# Patient Record
Sex: Male | Born: 1975 | Race: Black or African American | Hispanic: No | Marital: Single | Smoking: Current every day smoker
Health system: Southern US, Community
[De-identification: ages and names within clinical notes are randomized; demographics above are authoritative.]

## PROBLEM LIST (undated history)

## (undated) ENCOUNTER — Emergency Department (HOSPITAL_COMMUNITY): Discharge status: Absent without leave | Payer: Self-pay

## (undated) DIAGNOSIS — I10 Essential (primary) hypertension: Secondary | ICD-10-CM

## (undated) DIAGNOSIS — C801 Malignant (primary) neoplasm, unspecified: Secondary | ICD-10-CM

## (undated) DIAGNOSIS — Z21 Asymptomatic human immunodeficiency virus [HIV] infection status: Secondary | ICD-10-CM

## (undated) DIAGNOSIS — E119 Type 2 diabetes mellitus without complications: Secondary | ICD-10-CM

## (undated) DIAGNOSIS — S8263XA Displaced fracture of lateral malleolus of unspecified fibula, initial encounter for closed fracture: Secondary | ICD-10-CM

## (undated) DIAGNOSIS — B20 Human immunodeficiency virus [HIV] disease: Secondary | ICD-10-CM

## (undated) DIAGNOSIS — Z8674 Personal history of sudden cardiac arrest: Secondary | ICD-10-CM

## (undated) DIAGNOSIS — I5189 Other ill-defined heart diseases: Secondary | ICD-10-CM

## (undated) DIAGNOSIS — F1911 Other psychoactive substance abuse, in remission: Secondary | ICD-10-CM

## (undated) DIAGNOSIS — Z8701 Personal history of pneumonia (recurrent): Secondary | ICD-10-CM

## (undated) DIAGNOSIS — E785 Hyperlipidemia, unspecified: Secondary | ICD-10-CM

## (undated) DIAGNOSIS — R12 Heartburn: Secondary | ICD-10-CM

## (undated) DIAGNOSIS — S93422A Sprain of deltoid ligament of left ankle, initial encounter: Secondary | ICD-10-CM

## (undated) DIAGNOSIS — Z87898 Personal history of other specified conditions: Secondary | ICD-10-CM

## (undated) DIAGNOSIS — Z91199 Patient's noncompliance with other medical treatment and regimen due to unspecified reason: Secondary | ICD-10-CM

## (undated) DIAGNOSIS — Z87891 Personal history of nicotine dependence: Secondary | ICD-10-CM

## (undated) DIAGNOSIS — G43909 Migraine, unspecified, not intractable, without status migrainosus: Secondary | ICD-10-CM

## (undated) DIAGNOSIS — A6002 Herpesviral infection of other male genital organs: Secondary | ICD-10-CM

## (undated) HISTORY — DX: Personal history of other specified conditions: Z87.898

## (undated) HISTORY — PX: NO PAST SURGERIES: SHX2092

## (undated) HISTORY — DX: Personal history of sudden cardiac arrest: Z86.74

## (undated) HISTORY — DX: Patient's noncompliance with other medical treatment and regimen due to unspecified reason: Z91.199

## (undated) HISTORY — DX: Other psychoactive substance abuse, in remission: F19.11

## (undated) HISTORY — DX: Personal history of pneumonia (recurrent): Z87.01

## (undated) HISTORY — DX: Personal history of nicotine dependence: Z87.891

## (undated) HISTORY — DX: Other ill-defined heart diseases: I51.89

---

## 1999-08-25 ENCOUNTER — Emergency Department (HOSPITAL_COMMUNITY): Admission: EM | Admit: 1999-08-25 | Discharge: 1999-08-25 | Payer: Self-pay | Admitting: Emergency Medicine

## 2000-03-16 ENCOUNTER — Emergency Department (HOSPITAL_COMMUNITY): Admission: EM | Admit: 2000-03-16 | Discharge: 2000-03-16 | Payer: Self-pay | Admitting: Emergency Medicine

## 2005-02-22 ENCOUNTER — Emergency Department (HOSPITAL_COMMUNITY): Admission: EM | Admit: 2005-02-22 | Discharge: 2005-02-22 | Payer: Self-pay | Admitting: Family Medicine

## 2005-06-08 ENCOUNTER — Emergency Department (HOSPITAL_COMMUNITY): Admission: EM | Admit: 2005-06-08 | Discharge: 2005-06-08 | Payer: Self-pay | Admitting: Family Medicine

## 2005-11-05 ENCOUNTER — Emergency Department (HOSPITAL_COMMUNITY): Admission: EM | Admit: 2005-11-05 | Discharge: 2005-11-05 | Payer: Self-pay | Admitting: Family Medicine

## 2011-10-02 ENCOUNTER — Encounter (HOSPITAL_COMMUNITY): Payer: Self-pay

## 2011-10-02 ENCOUNTER — Emergency Department (HOSPITAL_COMMUNITY)
Admission: EM | Admit: 2011-10-02 | Discharge: 2011-10-02 | Disposition: A | Discharge status: Home / Self Care | Payer: Self-pay | Attending: Emergency Medicine | Admitting: Emergency Medicine

## 2011-10-02 DIAGNOSIS — L02215 Cutaneous abscess of perineum: Secondary | ICD-10-CM

## 2011-10-02 DIAGNOSIS — E785 Hyperlipidemia, unspecified: Secondary | ICD-10-CM | POA: Insufficient documentation

## 2011-10-02 DIAGNOSIS — F17200 Nicotine dependence, unspecified, uncomplicated: Secondary | ICD-10-CM | POA: Insufficient documentation

## 2011-10-02 DIAGNOSIS — I10 Essential (primary) hypertension: Secondary | ICD-10-CM | POA: Insufficient documentation

## 2011-10-02 DIAGNOSIS — K612 Anorectal abscess: Secondary | ICD-10-CM | POA: Insufficient documentation

## 2011-10-02 DIAGNOSIS — L02219 Cutaneous abscess of trunk, unspecified: Secondary | ICD-10-CM | POA: Insufficient documentation

## 2011-10-02 DIAGNOSIS — Z21 Asymptomatic human immunodeficiency virus [HIV] infection status: Secondary | ICD-10-CM | POA: Insufficient documentation

## 2011-10-02 DIAGNOSIS — B20 Human immunodeficiency virus [HIV] disease: Secondary | ICD-10-CM

## 2011-10-02 HISTORY — DX: Herpesviral infection of other male genital organs: A60.02

## 2011-10-02 HISTORY — DX: Human immunodeficiency virus (HIV) disease: B20

## 2011-10-02 HISTORY — DX: Essential (primary) hypertension: I10

## 2011-10-02 HISTORY — DX: Hyperlipidemia, unspecified: E78.5

## 2011-10-02 MED ORDER — OXYCODONE-ACETAMINOPHEN 5-325 MG PO TABS
1.0000 | ORAL_TABLET | Freq: Once | ORAL | Status: AC
  Administered 2011-10-02: 1 via ORAL
  Filled 2011-10-02: qty 1

## 2011-10-02 MED ORDER — OXYCODONE-ACETAMINOPHEN 5-325 MG PO TABS: 1.0000 | ORAL_TABLET | Freq: Three times a day (TID) | ORAL | Status: AC | PRN

## 2011-10-02 MED ORDER — LIDOCAINE-HYDROCORTISONE ACE 3-0.5 % RE CREA: 1.0000 | TOPICAL_CREAM | Freq: Two times a day (BID) | RECTAL | Status: DC

## 2011-10-02 MED ORDER — SULFAMETHOXAZOLE-TMP DS 800-160 MG PO TABS: 1.0000 | ORAL_TABLET | Freq: Two times a day (BID) | ORAL | Status: DC

## 2011-10-02 MED ORDER — SULFAMETHOXAZOLE-TMP DS 800-160 MG PO TABS
1.0000 | ORAL_TABLET | Freq: Once | ORAL | Status: AC
  Administered 2011-10-02: 1 via ORAL
  Filled 2011-10-02: qty 1

## 2011-10-02 NOTE — Discharge Instructions (Signed)
Abscess An abscess (boil or furuncle) is an infected area under your skin. This area is filled with yellowish white fluid (pus). HOME CARE   Only take medicine as told by your doctor.   Keep the skin clean around your abscess. Keep clothes that may touch the abscess clean.   Change any bandages (dressings) as told by your doctor.   Avoid direct skin contact with other people. The infection can spread by skin contact with others.   Practice good hygiene and do not share personal care items.   Do not share athletic equipment, towels, or whirlpools. Shower after every practice or work out session.   If a draining area cannot be covered:   Do not play sports.   Children should not go to daycare until the wound has healed or until fluid (drainage) stops coming out of the wound.   See your doctor for a follow-up visit as told.  GET HELP RIGHT AWAY IF:   There is more pain, puffiness (swelling), and redness in the wound site.   There is fluid or bleeding from the wound site.   You have muscle aches, chills, fever, or feel sick.   You or your child has a temperature by mouth above 102 F (38.9 C), not controlled by medicine.   Your baby is older than 3 months with a rectal temperature of 102 F (38.9 C) or higher.  MAKE SURE YOU:   Understand these instructions.   Will watch your condition.   Will get help right away if you are not doing well or get worse.  Document Released: 12/16/2007 Document Revised: 06/18/2011 Document Reviewed: 12/16/2007 [facility] Patient Information 2012 [place], [place].CD4 Count This is a test used to measure the strength of your immune system if you've been diagnosed with HIV infection. This test measures the number of CD4 cells (also known as T-helper cells) in your blood and assesses the status of your immune system. CD4 cells are a type of white blood cell that fights infection, and they play an important role in your immune system. They help to  identify, attack, and destroy specific bacteria, fungi, and other germs that affect the body. CD4 cells are made in the spleen, lymph nodes, and thymus gland, and they circulate throughout the body in the bloodstream. CD4 cells are a major target for HIV, which binds to the surface of CD4 cells, enters them, and either reproduces immediately, killing them in the process, or remains in a resting state, reproducing later. As the HIV virus gets into the cell and replicates, the number of CD4 cells in the blood gradually declines. As HIV disease progresses, the CD4 count will go down and as treatment reduces the progression, the CD4 count will go back up.  The CD4 count tells your caregivers how strong your immune system is, how far HIV disease has advanced (the stage of the disease), and helps predict the risk of complications and debilitating infections. The CD4 count is most useful when it is compared with the count obtained from an earlier test.  The CD4 count is used in combination with the viral load test, which measures the level of HIV in the blood, to determine the staging and outlook of the disease.  PREPARATION FOR TEST No preparation is required.  NORMAL FINDINGS  T cells  Percent: 60-95   Number of Cells/microL: [phone]  T-helper (CD4) cells  Percent: 60-75   Number of Cells/microL: [phone]  T-suppressor (CD8) cells  Percent: 25-30  Number of Cells/microL: [phone]  B cells  Percent: 4-25   Number of Cells/microL: 100-450  Natural killer cells  Percent: 4-30   Number of Cells/microL:75-500  CD4/CD8 ratio: Greater than 1 Ranges for normal findings may vary among different laboratories and hospitals. You should always check with your doctor after having lab work or other tests done to discuss the meaning of your test results and whether your values are considered within normal limits. MEANING OF TEST  Your caregiver will go over the test results with you and discuss the  importance and meaning of your results, as well as treatment options and the need for additional tests if necessary. OBTAINING THE TEST RESULTS It is your responsibility to obtain your test results. Ask the lab or department performing the test when and how you will get your results. Document Released: 07/21/2004 Document Revised: 06/18/2011 Document Reviewed: 06/06/2008 [facility] Patient Information 2012 [place], [place].  HIV Infection and AIDS HIV stands for human immunodeficiency virus. HIV is the virus that causes the disease known as AIDS (acquired immunodeficiency syndrome). HIV is a viral infection that attacks the T-cell lymphocytes of the human immune system. If left untreated, HIV will kill enough T-cells so that the body cannot fight off infection. Patients who have AIDS, suffer from "opportunistic infections." Opportunistic infections take advantage of the patient's weak immune system, and cause illness. RISK FACTORS   Direct contact with blood or other body fluids.   Unprotected sexual intercourse.   Sharing of contaminated needles.   Blood transfusions.   Infants whose mothers were infected, during pregnancy or through breast milk.  SYMPTOMS   Sometimes, no symptoms.   Flu-like symptoms.   Repeated severe yeast infections in mouth or vagina, despite treatment.   Swollen lymph nodes.   Muscle pain.   Joint pain.   Persistent diarrhea.   Loss of appetite.   Weight loss.   Frequent opportunistic diseases:   Kaposi's sarcoma.   Pneumocystis carinii pneumonia (PCP)   Tuberculosis.   Meningitis.   Herpes simplex infections.   Blurry vision.   Loss of vision.  PREVENTION   Know the sexual history of any new sexual partner.   Use safe sex practices, with barrier protection.   Avoid having multiple sexual partners.   Avoid direct contact with blood or other body fluids, by using gloves, goggles, and masks when you might encounter them.   Do not  share needles.  TREATMENT  HIV and AIDS have no known cure. However, with early diagnosis and proper treatment, one can live a relatively healthy and long life. Treatment is directed at decreasing the level of virus in the body (viral load). To decrease the viral load, patients are given antiviral medicines. Patients are also given preventive care for many opportunistic diseases, such as pneumonia, tuberculosis, toxoplasmosis, tetanus, hepatitis B, pneumococcal infections, and influenza. Opportunistic infections are also treated as they develop.  Document Released: 06/29/2005 Document Revised: 06/18/2011 Document Reviewed: 10/11/2008 [facility] Patient Information 2012 [place], [place].

## 2011-10-02 NOTE — ED Notes (Signed)
Pt states that for the past week he has been having an abscess or sore area that is inbetween his rectum and scrotum. He states that he also has sores on both side of his genitalia on both sides. Pt has been trying to use cream with no relief.

## 2011-10-02 NOTE — ED Provider Notes (Signed)
History     CSN: [id number]  Arrival date & time 10/02/11  1308   First MD Initiated Contact with Patient 10/02/11 1100      Chief Complaint  Patient presents with  . Abscess   Patient with a known history of HIV disease, herpes, hypertension. States is scheduled to see the infectious disease clinic. Later this month. He is currently not on his HIV medications. Patient reports draining area, "abscess" around the perineum, and right perirectal area. He's had no fevers, no vomiting, no abdominal pain, no dysuria, no testicular pain. No back pain, chest pain, shortness of breath. No dizziness or syncope. (Consider location/radiation/quality/duration/timing/severity/associated sxs/prior treatment) HPI  Past Medical History  Diagnosis Date  . Herpes genitalis in men   . HIV disease   . Hyperlipidemia   . Hypertension     History reviewed. No pertinent past surgical history.  History reviewed. No pertinent family history.  History  Substance Use Topics  . Smoking status: Current Everyday Smoker -- 0.5 packs/day  . Smokeless tobacco: Not on file  . Alcohol Use: No      Review of Systems  All other systems reviewed and are negative.    Allergies  Review of patient's allergies indicates no known allergies.  Home Medications  No current outpatient prescriptions on file.  BP 152/107  Pulse 107  Temp(Src) 98.5 F (36.9 C) (Oral)  Resp 20  SpO2 99%  Physical Exam  Nursing note and vitals reviewed. Constitutional: He is oriented to person, place, and time. He appears well-developed and well-nourished.  HENT:  Head: Normocephalic and atraumatic.  Eyes: Conjunctivae and EOM are normal. Pupils are equal, round, and reactive to light.  Neck: Neck supple.  Cardiovascular: Normal rate and regular rhythm.  Exam reveals no gallop and no friction rub.   No murmur heard. Pulmonary/Chest: Breath sounds normal. He has no wheezes. He has no rales. He exhibits no tenderness.    Abdominal: Soft. Bowel sounds are normal. He exhibits no distension. There is no tenderness. There is no rebound and no guarding.  Genitourinary: Penis normal.       No testicular tenderness or swelling. Open area draining some yellow exudative fluid in the left perineum. However, there is no redness, no induration, no swelling, no appreciable drainable fluid collection. Similar area in the right perirectal area, which is draining a small amount of yellow fluid. Again, no induration, no redness, no obvious drainable fluid collection.  Musculoskeletal: Normal range of motion.  Neurological: He is alert and oriented to person, place, and time. No cranial nerve deficit. Coordination normal.  Skin: Skin is warm and dry. No rash noted.  Psychiatric: He has a normal mood and affect.    ED Course  Procedures (including critical care time)  Labs Reviewed - No data to display No results found.   No diagnosis found.  Patient is seen and examined, initial history and physical is completed. Evaluation initiated  MDM  Patient is seen and examined, initial history and physical is completed. Evaluation initiated  Mild hypertension noted, other vital signs normal. Patient is completely examined. Will put on a course of Bactrim and Percocet. There is no obvious drainable fluid collection that would require an incision and drainage. At this time. Recommend followup in the ED in 24-48 hours.      Alilah Mcmeans A. Patrica Duel, MD 10/02/11 1114

## 2011-10-08 ENCOUNTER — Ambulatory Visit (INDEPENDENT_AMBULATORY_CARE_PROVIDER_SITE_OTHER): Payer: Self-pay

## 2011-10-08 ENCOUNTER — Other Ambulatory Visit: Payer: Self-pay | Admitting: Internal Medicine

## 2011-10-08 DIAGNOSIS — B20 Human immunodeficiency virus [HIV] disease: Secondary | ICD-10-CM

## 2011-10-08 DIAGNOSIS — Z79899 Other long term (current) drug therapy: Secondary | ICD-10-CM

## 2011-10-08 DIAGNOSIS — Z113 Encounter for screening for infections with a predominantly sexual mode of transmission: Secondary | ICD-10-CM

## 2011-10-08 LAB — COMPLETE METABOLIC PANEL WITH GFR
ALT: 38 U/L (ref 0–53)
AST: 36 U/L (ref 0–37)
Alkaline Phosphatase: 93 U/L (ref 39–117)
CO2: 26 mEq/L (ref 19–32)
Creat: 0.91 mg/dL (ref 0.50–1.35)
GFR, Est African American: >89 mL/min
Sodium: 138 mEq/L (ref 135–145)
Total Bilirubin: 0.4 mg/dL (ref 0.3–1.2)
Total Protein: 8.1 g/dL (ref 6.0–8.3)

## 2011-10-08 LAB — CBC WITH DIFFERENTIAL/PLATELET
Basophils Absolute: 0 10*3/uL (ref 0.0–0.1)
Eosinophils Absolute: 0.1 10*3/uL (ref 0.0–0.7)
Eosinophils Relative: 3 % (ref 0–5)
Lymphs Abs: 0.3 10*3/uL — ABNORMAL LOW (ref 0.7–4.0)
MCH: 28.8 pg (ref 26.0–34.0)
MCV: 89.3 fL (ref 78.0–100.0)
Neutrophils Relative %: 58 % (ref 43–77)
Platelets: 183 10*3/uL (ref 150–400)
RBC: 3.93 MIL/uL — ABNORMAL LOW (ref 4.22–5.81)
RDW: 13.8 % (ref 11.5–15.5)
WBC: 1.6 10*3/uL — ABNORMAL LOW (ref 4.0–10.5)

## 2011-10-08 LAB — LIPID PANEL
HDL: 20 mg/dL — ABNORMAL LOW (ref >39)
LDL Cholesterol: 18 mg/dL (ref 0–99)

## 2011-10-08 LAB — HEPATITIS B SURFACE ANTIGEN: Hepatitis B Surface Ag: NEGATIVE

## 2011-10-08 LAB — URINALYSIS
Hgb urine dipstick: NEGATIVE
Ketones, ur: NEGATIVE mg/dL
Leukocytes, UA: NEGATIVE
Nitrite: NEGATIVE
Protein, ur: 30 mg/dL — AB
pH: 6 (ref 5.0–8.0)

## 2011-10-08 LAB — HEPATITIS C ANTIBODY: HCV Ab: REACTIVE — AB

## 2011-10-08 LAB — HEPATITIS B SURFACE ANTIBODY,QUALITATIVE: Hep B S Ab: NEGATIVE

## 2011-10-08 MED ORDER — DARUNAVIR ETHANOLATE 600 MG PO TABS: 600.0000 mg | ORAL_TABLET | Freq: Two times a day (BID) | ORAL | Status: DC

## 2011-10-08 MED ORDER — RITONAVIR 100 MG PO TABS: 100.0000 mg | ORAL_TABLET | Freq: Two times a day (BID) | ORAL | Status: DC

## 2011-10-08 MED ORDER — SULFAMETHOXAZOLE-TMP DS 800-160 MG PO TABS: 1.0000 | ORAL_TABLET | Freq: Once | ORAL | Status: DC

## 2011-10-08 MED ORDER — RALTEGRAVIR POTASSIUM 400 MG PO TABS: 400.0000 mg | ORAL_TABLET | Freq: Two times a day (BID) | ORAL | Status: DC

## 2011-10-08 MED ORDER — AZITHROMYCIN 600 MG PO TABS: 1200.0000 mg | ORAL_TABLET | ORAL | Status: DC

## 2011-10-08 MED ORDER — EMTRICITABINE-TENOFOVIR DF 200-300 MG PO TABS: 1.0000 | ORAL_TABLET | Freq: Every day | ORAL | Status: DC

## 2011-10-08 MED ORDER — VALACYCLOVIR HCL 500 MG PO TABS: 500.0000 mg | ORAL_TABLET | Freq: Two times a day (BID) | ORAL | Status: DC

## 2011-10-08 NOTE — Progress Notes (Signed)
Patient will be automatically restarted on ART regimen of RAL/truvada/DARr/etravirine since patient previously on RAL/truvada, but potentially had spotty compliance. I have added boosted PI, concern for resistance and loss of II.  Patient will be able to get 1 month of supplies, and in meantime, reapply for adap so that he doesn't have gap in coverage. Patient was last on ART in Aug 2012, CD 4 count reportedly 20-30s. We will also have pt start on OI with bactrim DS daily, azithromycin 1200mg  Qwk, and hsv proph with valtrex. Await lab results and have patient come into clinic for intake and initial visit early next week.

## 2011-10-09 LAB — T-HELPER CELL (CD4) - (RCID CLINIC ONLY): CD4 % Helper T Cell: 7 % — ABNORMAL LOW (ref 33–55)

## 2011-10-09 LAB — HEPATITIS A ANTIBODY, TOTAL: Hep A Total Ab: NEGATIVE

## 2011-10-09 LAB — HEPATITIS B CORE ANTIBODY, TOTAL: Hep B Core Total Ab: NEGATIVE

## 2011-10-12 LAB — HIV-1 RNA ULTRAQUANT REFLEX TO GENTYP+
HIV 1 RNA Quant: 2093782 copies/mL — ABNORMAL HIGH (ref <20)
HIV-1 RNA Quant, Log: 6.32 {Log} — ABNORMAL HIGH (ref <1.30)

## 2011-10-12 NOTE — Patient Instructions (Signed)
Pt is to return for work in t visit on 10-13-14 to see Dr Drue Second .  Please pick up medications prescribed today at the pharmacy and start today.  See financial counselor to renew ADAP and Juanell Fairly .

## 2011-10-12 NOTE — Progress Notes (Signed)
Pt is a transfer from [facility] that has presented today without proper medical records. Only one office note dated 08-14-10.  Patient states he has a history of non compliance to to transportation  problems. He has not been on his HIV medications for 5 months. He is not sure of the name of the medications but knows at some point he was taking Atripla and told to stop by his ID provider. Pt is very anxious and concerned about the progression of his HIV since he has been with extreme fatigue for months now.  He has a rash on his face and states he has an abscess in his rectum that is currently being treated with Bactrim given at the Urgent Care. He states his entire groin area is inflamed and irritated .  Last regimen obtain from pharmacy. His ADAP is due to end 10-10-10.  I spoke with Dr Osvaldo Human who has agreed to restart his medications and see patient as a work in visit on 10-13-11. Pt states as his last office visit his CD4 was very low.  Office note dated 08-14-10 stared CD4 as33.  Labs included with this note dated 03-05-11 CD4= 72 with a viral load of [id number]. Several attempts made to contact [facility] office for more labs and medical records .  Release signed and faxed but I never received a response. Release sent to medical records to obtain entire charting.   Pt states he was given medication at Urgent Care  for rectal abcess but he was not able to pay for script due to price. He did get the antibiotic.   [name], RN

## 2011-10-13 ENCOUNTER — Ambulatory Visit: Payer: Self-pay

## 2011-10-13 ENCOUNTER — Ambulatory Visit: Payer: Self-pay | Admitting: Internal Medicine

## 2011-10-15 LAB — HIV-1 GENOTYPR PLUS

## 2011-10-27 ENCOUNTER — Encounter: Payer: Self-pay | Admitting: Internal Medicine

## 2011-10-27 ENCOUNTER — Ambulatory Visit: Payer: Self-pay

## 2011-10-27 ENCOUNTER — Ambulatory Visit (INDEPENDENT_AMBULATORY_CARE_PROVIDER_SITE_OTHER): Payer: Self-pay | Admitting: Internal Medicine

## 2011-10-27 ENCOUNTER — Telehealth: Payer: Self-pay | Admitting: Licensed Clinical Social Worker

## 2011-10-27 VITALS — BP 157/103 | HR 93 | Temp 98.7°F | Ht 72.0 in | Wt 240.0 lb

## 2011-10-27 DIAGNOSIS — I10 Essential (primary) hypertension: Secondary | ICD-10-CM | POA: Insufficient documentation

## 2011-10-27 DIAGNOSIS — E785 Hyperlipidemia, unspecified: Secondary | ICD-10-CM

## 2011-10-27 DIAGNOSIS — E781 Pure hyperglyceridemia: Secondary | ICD-10-CM

## 2011-10-27 DIAGNOSIS — B009 Herpesviral infection, unspecified: Secondary | ICD-10-CM | POA: Insufficient documentation

## 2011-10-27 DIAGNOSIS — B20 Human immunodeficiency virus [HIV] disease: Secondary | ICD-10-CM

## 2011-10-27 DIAGNOSIS — Z21 Asymptomatic human immunodeficiency virus [HIV] infection status: Secondary | ICD-10-CM

## 2011-10-27 MED ORDER — DARUNAVIR ETHANOLATE 600 MG PO TABS: 600.0000 mg | ORAL_TABLET | Freq: Two times a day (BID) | ORAL | Status: DC

## 2011-10-27 MED ORDER — RITONAVIR 100 MG PO TABS: 100.0000 mg | ORAL_TABLET | Freq: Two times a day (BID) | ORAL | Status: DC

## 2011-10-27 MED ORDER — SULFAMETHOXAZOLE-TMP DS 800-160 MG PO TABS: 1.0000 | ORAL_TABLET | Freq: Once | ORAL | Status: DC

## 2011-10-27 MED ORDER — ROSUVASTATIN CALCIUM 20 MG PO TABS: 20.0000 mg | ORAL_TABLET | Freq: Every day | ORAL | Status: DC

## 2011-10-27 MED ORDER — AZITHROMYCIN 600 MG PO TABS: 1200.0000 mg | ORAL_TABLET | ORAL | Status: DC

## 2011-10-27 MED ORDER — VALACYCLOVIR HCL 500 MG PO TABS: 500.0000 mg | ORAL_TABLET | Freq: Two times a day (BID) | ORAL | Status: DC

## 2011-10-27 MED ORDER — RALTEGRAVIR POTASSIUM 400 MG PO TABS: 400.0000 mg | ORAL_TABLET | Freq: Two times a day (BID) | ORAL | Status: DC

## 2011-10-27 MED ORDER — EMTRICITABINE-TENOFOVIR DF 200-300 MG PO TABS: 1.0000 | ORAL_TABLET | Freq: Every day | ORAL | Status: DC

## 2011-10-27 NOTE — Telephone Encounter (Signed)
Patient called stating that he is out of bactrim and Isentress. He only has Truvada, and wants to know what we could do to help him with medications while he is waiting for ADAP to be approved. I called the patient back to let him know currently we don't have samples of those medications and not to take the Truvada without the Isentress.

## 2011-11-03 ENCOUNTER — Telehealth: Payer: Self-pay

## 2011-11-03 NOTE — Telephone Encounter (Signed)
Jeremiah Stevens,  Will you please make a referral for Jeremiah Stevens for Dental Clinic services.  He is aware there is a waiting list.  He called today and said he forgot to ask during his office visit.   [name], RN, BSN

## 2011-11-06 NOTE — Telephone Encounter (Signed)
Called patient and informed him that the dental form has to be signed by him before they will make him an appt. He advised he lives in [place] and it would be better if I mailed it to him.

## 2011-11-09 ENCOUNTER — Other Ambulatory Visit: Payer: Self-pay | Admitting: Licensed Clinical Social Worker

## 2011-11-09 ENCOUNTER — Telehealth: Payer: Self-pay

## 2011-11-09 DIAGNOSIS — B20 Human immunodeficiency virus [HIV] disease: Secondary | ICD-10-CM

## 2011-11-09 DIAGNOSIS — E785 Hyperlipidemia, unspecified: Secondary | ICD-10-CM

## 2011-11-09 DIAGNOSIS — B009 Herpesviral infection, unspecified: Secondary | ICD-10-CM

## 2011-11-09 MED ORDER — EMTRICITABINE-TENOFOVIR DF 200-300 MG PO TABS: 1.0000 | ORAL_TABLET | Freq: Every day | ORAL | Status: DC

## 2011-11-09 MED ORDER — ROSUVASTATIN CALCIUM 20 MG PO TABS: 20.0000 mg | ORAL_TABLET | Freq: Every day | ORAL | Status: DC

## 2011-11-09 MED ORDER — SULFAMETHOXAZOLE-TMP DS 800-160 MG PO TABS: 1.0000 | ORAL_TABLET | Freq: Every day | ORAL | Status: DC

## 2011-11-09 MED ORDER — RITONAVIR 100 MG PO TABS: 100.0000 mg | ORAL_TABLET | Freq: Two times a day (BID) | ORAL | Status: DC

## 2011-11-09 MED ORDER — AZITHROMYCIN 600 MG PO TABS: 1200.0000 mg | ORAL_TABLET | ORAL | Status: DC

## 2011-11-09 MED ORDER — RALTEGRAVIR POTASSIUM 400 MG PO TABS: 400.0000 mg | ORAL_TABLET | Freq: Two times a day (BID) | ORAL | Status: DC

## 2011-11-09 MED ORDER — VALACYCLOVIR HCL 500 MG PO TABS: 500.0000 mg | ORAL_TABLET | Freq: Two times a day (BID) | ORAL | Status: DC

## 2011-11-09 MED ORDER — LIDOCAINE-HYDROCORTISONE ACE 3-0.5 % RE CREA: 1.0000 | TOPICAL_CREAM | Freq: Two times a day (BID) | RECTAL | Status: DC

## 2011-11-09 MED ORDER — DARUNAVIR ETHANOLATE 600 MG PO TABS: 600.0000 mg | ORAL_TABLET | Freq: Two times a day (BID) | ORAL | Status: DC

## 2011-11-09 NOTE — Telephone Encounter (Signed)
Patient was approved for ADAP - called to let him know and phone vm box has not been set-uo yet - let Amy Pulliam know he was approved.

## 2011-11-25 NOTE — Progress Notes (Signed)
HIV CLINIC VISIT  RFV: follow up Subjective:    [name], male    DOB: 1975-08-12, 36 y.o.   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 Diagnosed in 2006. Currently on ral/truvada/boosted darunavir. Previously getting hiv care at [facility]. Ran out of medications 3 months ago. Denies any acute illness. No f/chills/ns/cough/n/v/diarrhea/headache or rash.  Prior to Admission medications   Medication Sig Start Date End Date Taking? Authorizing Provider  azithromycin (ZITHROMAX) 600 MG tablet Take 2 tablets (1,200 mg total) by mouth once a week. 11/09/11   Judyann Munson, MD  darunavir (PREZISTA) 600 MG tablet Take 1 tablet (600 mg total) by mouth 2 (two) times daily with a meal. 11/09/11 11/08/12  Judyann Munson, MD  emtricitabine-tenofovir (TRUVADA) 200-300 MG per tablet Take 1 tablet by mouth daily. 11/09/11 11/08/12  Judyann Munson, MD  lidocaine-hydrocortisone (ANAMANTLE HC) 3-0.5 % CREA Place 1 Applicatorful rectally 2 (two) times daily. 11/09/11   Judyann Munson, MD  raltegravir (ISENTRESS) 400 MG tablet Take 1 tablet (400 mg total) by mouth 2 (two) times daily. 11/09/11 11/08/12  Judyann Munson, MD  ritonavir (NORVIR) 100 MG TABS Take 1 tablet (100 mg total) by mouth 2 (two) times daily with a meal. 11/09/11   Judyann Munson, MD  rosuvastatin (CRESTOR) 20 MG tablet Take 1 tablet (20 mg total) by mouth at bedtime. 11/09/11 11/08/12  Judyann Munson, MD  sulfamethoxazole-trimethoprim (BACTRIM DS) 800-160 MG per tablet Take 1 tablet by mouth daily. 11/09/11   Judyann Munson, MD  valACYclovir (VALTREX) 500 MG tablet Take 1 tablet (500 mg total) by mouth 2 (two) times daily. 11/09/11   Judyann Munson, MD   Active Ambulatory Problems    Diagnosis Date Noted  . Human immunodeficiency virus (HIV) disease 10/27/2011  . HTN (hypertension) 10/27/2011  . Hypertriglyceridemia 10/27/2011  . Herpes 10/27/2011   Resolved Ambulatory  Problems    Diagnosis Date Noted  . No Resolved Ambulatory Problems   Past Medical History  Diagnosis Date  . Herpes genitalis in men   . HIV disease   . Hyperlipidemia   . Hypertension    History  Substance Use Topics  . Smoking status: Current Everyday Smoker -- 0.5 packs/day for 16 years  . Smokeless tobacco: Never Used  . Alcohol Use: No   Family hx of DM and CAD  Review of Systems Review of Systems  Constitutional: Negative for fever, chills, diaphoresis, activity change, appetite change, fatigue and unexpected weight change.  HENT: Negative for congestion, sore throat, rhinorrhea, sneezing, trouble swallowing and sinus pressure.  Eyes: Negative for photophobia and visual disturbance.  Respiratory: Negative for cough, chest tightness, shortness of breath, wheezing and stridor.  Cardiovascular: Negative for chest pain, palpitations and leg swelling.  Gastrointestinal: Negative for nausea, vomiting, abdominal pain, diarrhea, constipation, blood in stool, abdominal distention and anal bleeding.  Genitourinary: Negative for dysuria, hematuria, flank pain and difficulty urinating.  Musculoskeletal: Negative for myalgias, back pain, joint swelling, arthralgias and gait problem.  Skin: Negative for color change, pallor, rash and wound.  Neurological: Negative for dizziness, tremors, weakness and light-headedness.  Hematological: Negative for adenopathy. Does not bruise/bleed easily.  Psychiatric/Behavioral: Negative for behavioral problems, confusion, sleep disturbance, dysphoric mood, decreased concentration and agitation.       Objective:   Physical Exam  BP 157/103  Pulse 93  Temp(Src) 98.7 F (37.1 C) (Oral)  Ht 6' (1.829 m)  Wt 240  lb (108.863 kg)  BMI 32.55 kg/m2 Physical Exam  Constitutional: He is oriented to person, place, and time. He appears well-developed and well-nourished. No distress.  HENT:  Mouth/Throat: Oropharynx is clear and moist. No oropharyngeal  exudate.  Cardiovascular: Normal rate, regular rhythm and normal heart sounds. Exam reveals no gallop and no friction rub.  No murmur heard.  Pulmonary/Chest: Effort normal and breath sounds normal. No respiratory distress. He has no wheezes.  Abdominal: Soft. Bowel sounds are normal. He exhibits no distension. There is no tenderness.  Lymphadenopathy:  He has no cervical adenopathy.  Neurological: He is alert and oriented to person, place, and time.  Skin: Skin is warm and dry. No rash noted. No erythema.  Psychiatric: He has a normal mood and affect. His behavior is normal.         Assessment & Plan:  HIV = will need to re initiate his prior salvage regimen of raltegravir/boosted darunavir/truvada with the plan to recheck labs in 4-6 wks to see that he is being suppressed as expected.  OI proph = continue with bactrim DS daily, and azithro 1200mg  Qwk  Hypertriglyceridemia= will recommend to start rosuvastatin and may need fish oil as well   rtc in 4-6wks.

## 2011-11-26 ENCOUNTER — Ambulatory Visit (INDEPENDENT_AMBULATORY_CARE_PROVIDER_SITE_OTHER): Payer: Self-pay | Admitting: Internal Medicine

## 2011-11-26 ENCOUNTER — Encounter: Payer: Self-pay | Admitting: Internal Medicine

## 2011-11-26 VITALS — BP 166/91 | HR 98 | Temp 98.4°F | Wt 234.0 lb

## 2011-11-26 DIAGNOSIS — Z21 Asymptomatic human immunodeficiency virus [HIV] infection status: Secondary | ICD-10-CM

## 2011-11-26 DIAGNOSIS — B20 Human immunodeficiency virus [HIV] disease: Secondary | ICD-10-CM

## 2011-11-26 LAB — COMPREHENSIVE METABOLIC PANEL
ALT: 18 U/L (ref 0–53)
AST: 19 U/L (ref 0–37)
Calcium: 9.3 mg/dL (ref 8.4–10.5)
Chloride: 105 mEq/L (ref 96–112)
Creat: 1.03 mg/dL (ref 0.50–1.35)

## 2011-11-27 LAB — CBC WITH DIFFERENTIAL/PLATELET
Basophils Absolute: 0 10*3/uL (ref 0.0–0.1)
Basophils Relative: 0 % (ref 0–1)
MCHC: 34.4 g/dL (ref 30.0–36.0)
Monocytes Relative: 20 % — ABNORMAL HIGH (ref 3–12)
Neutro Abs: 1 10*3/uL — ABNORMAL LOW (ref 1.7–7.7)
Neutrophils Relative %: 36 % — ABNORMAL LOW (ref 43–77)
RBC: 4.52 MIL/uL (ref 4.22–5.81)
WBC: 2.9 10*3/uL — ABNORMAL LOW (ref 4.0–10.5)

## 2011-11-27 LAB — T-HELPER CELL (CD4) - (RCID CLINIC ONLY): CD4 % Helper T Cell: 14 % — ABNORMAL LOW (ref 33–55)

## 2012-01-25 ENCOUNTER — Encounter: Payer: Self-pay | Admitting: *Deleted

## 2012-01-26 ENCOUNTER — Ambulatory Visit (INDEPENDENT_AMBULATORY_CARE_PROVIDER_SITE_OTHER): Payer: Self-pay | Admitting: Internal Medicine

## 2012-01-26 ENCOUNTER — Encounter: Payer: Self-pay | Admitting: Internal Medicine

## 2012-01-26 VITALS — BP 162/109 | HR 80 | Temp 97.7°F | Wt 226.0 lb

## 2012-01-26 DIAGNOSIS — Z21 Asymptomatic human immunodeficiency virus [HIV] infection status: Secondary | ICD-10-CM

## 2012-01-26 DIAGNOSIS — B20 Human immunodeficiency virus [HIV] disease: Secondary | ICD-10-CM

## 2012-01-26 LAB — CBC WITH DIFFERENTIAL/PLATELET
HCT: 43.9 % (ref 39.0–52.0)
Hemoglobin: 15.8 g/dL (ref 13.0–17.0)
Lymphocytes Relative: 44 % (ref 12–46)
MCHC: 36 g/dL (ref 30.0–36.0)
Monocytes Absolute: 0.5 10*3/uL (ref 0.1–1.0)
Monocytes Relative: 11 % (ref 3–12)
Neutro Abs: 2 10*3/uL (ref 1.7–7.7)
WBC: 4.5 10*3/uL (ref 4.0–10.5)

## 2012-01-26 LAB — COMPLETE METABOLIC PANEL WITH GFR
Albumin: 4.3 g/dL (ref 3.5–5.2)
BUN: 10 mg/dL (ref 6–23)
CO2: 24 mEq/L (ref 19–32)
Calcium: 9.5 mg/dL (ref 8.4–10.5)
Chloride: 105 mEq/L (ref 96–112)
GFR, Est Non African American: >89 mL/min
Glucose, Bld: 78 mg/dL (ref 70–99)
Potassium: 4.1 mEq/L (ref 3.5–5.3)

## 2012-01-26 MED ORDER — OXYCODONE-ACETAMINOPHEN 5-325 MG PO TABS: 1.0000 | ORAL_TABLET | Freq: Four times a day (QID) | ORAL | Status: AC | PRN

## 2012-01-26 NOTE — Progress Notes (Signed)
HIV CLINIC VISIT  RFV: follow up visit  Subjective:    [name], male    DOB: May 09, 1976, 37 y.o.   MRN: [id number]  HPI Jeremiah Stevens is a 36 yo Male with HIV, CD 4 count 160(14)/ VL 112 on darunavir/rit/truvada. He states that he is doing well on his meds, has not missed a dose. Of late, he reports that he has been having LLQ and Left flank pain. It occurs roughly every other day. No pattern to what exacerbates it. Denies food or any movement that makes it worse. No association with eating, bowel movements. No dysuria. No blood in stool or urine. No n/v. He has been taking ibuprofen, tylenol, and goodies powder without relief. He did take someone's percocet 10 x 1 which worked well. He used to get this pain in the past 1-2 x per month  No fever/chills/ns/rash/n/v/diarrhea/ha  Current Outpatient Prescriptions on File Prior to Visit  Medication Sig Dispense Refill  . azithromycin (ZITHROMAX) 600 MG tablet Take 2 tablets (1,200 mg total) by mouth once a week.  8 tablet  6  . darunavir (PREZISTA) 600 MG tablet Take 1 tablet (600 mg total) by mouth 2 (two) times daily with a meal.  60 tablet  6  . emtricitabine-tenofovir (TRUVADA) 200-300 MG per tablet Take 1 tablet by mouth daily.  30 tablet  6  . lidocaine-hydrocortisone (ANAMANTLE HC) 3-0.5 % CREA Place 1 Applicatorful rectally 2 (two) times daily.  1 Tube  1  . raltegravir (ISENTRESS) 400 MG tablet Take 1 tablet (400 mg total) by mouth 2 (two) times daily.  60 tablet  6  . ritonavir (NORVIR) 100 MG TABS Take 1 tablet (100 mg total) by mouth 2 (two) times daily with a meal.  60 tablet  6  . rosuvastatin (CRESTOR) 20 MG tablet Take 1 tablet (20 mg total) by mouth at bedtime.  30 tablet  11  . sulfamethoxazole-trimethoprim (BACTRIM DS) 800-160 MG per tablet Take 1 tablet by mouth daily.  30 tablet  6  . valACYclovir (VALTREX) 500 MG tablet Take 1 tablet (500 mg total) by mouth 2 (two) times daily.  60 tablet  6   Active Ambulatory  Problems    Diagnosis Date Noted  . Human immunodeficiency virus (HIV) disease 10/27/2011  . HTN (hypertension) 10/27/2011  . Hypertriglyceridemia 10/27/2011  . Herpes 10/27/2011   Resolved Ambulatory Problems    Diagnosis Date Noted  . No Resolved Ambulatory Problems   Past Medical History  Diagnosis Date  . Herpes genitalis in men   . HIV disease   . Hyperlipidemia   . Hypertension    Review of Systems Review of Systems  Constitutional: Negative for fever, chills, diaphoresis, activity change, appetite change, fatigue and unexpected weight change.  HENT: Negative for congestion, sore throat, rhinorrhea, sneezing, trouble swallowing and sinus pressure.  Eyes: Negative for photophobia and visual disturbance.  Respiratory: Negative for cough, chest tightness, shortness of breath, wheezing and stridor.  Cardiovascular: Negative for chest pain, palpitations and leg swelling.  Gastrointestinal: see hpi Genitourinary: Negative for dysuria, hematuria, flank pain and difficulty urinating.  Musculoskeletal: Negative for myalgias, back pain, joint swelling, arthralgias and gait problem.  Skin: Negative for color change, pallor, rash and wound.  Neurological: Negative for dizziness, tremors, weakness and light-headedness.  Hematological: Negative for adenopathy. Does not bruise/bleed easily.  Psychiatric/Behavioral: Negative for behavioral problems, confusion, sleep disturbance, dysphoric mood, decreased concentration and agitation.       Objective:   Physical  Exam BP 162/109  Pulse 80  Temp 97.7 F (36.5 C) (Oral)  Wt 226 lb (102.513 kg)  Physical Exam  Constitutional: He is oriented to person, place, and time. He appears well-developed and well-nourished. No distress.  HENT:  Mouth/Throat: Oropharynx is clear and moist. No oropharyngeal exudate.  Cardiovascular: Normal rate, regular rhythm and normal heart sounds. Exam reveals no gallop and no friction rub.  No murmur heard.    Pulmonary/Chest: Effort normal and breath sounds normal. No respiratory distress. He has no wheezes.  Abdominal: Soft. Bowel sounds are normal. He exhibits no distension. Tenderness on deep palpation in mid region of lower quadrant along ant axillary line, above the superior iliac crest/ anteriorly. No splenomegaly. No guarding. No rebound. Lymphadenopathy:  no cervical adenopathy.   Skin: Skin is warm and dry. No rash noted. No erythema.  Psychiatric: He has a normal mood and affect. His behavior is normal.      Assessment & Plan:  HIV= continue on medications. Will check cd 4 count, viral load,cmp  msk pain = based on abdominal exam, it appears more like muscle strain. i will give 1 time rx for percocet 5/325 #30 with no refills if needed for significant pain. I have asked patient to do stretching excercises. Regular diet. Avoid constipation by taking stool softeners. Will check lactic acid.  oi proph = continue on bactrim for now  rtc in 3 months

## 2012-01-28 LAB — HIV-1 RNA QUANT-NO REFLEX-BLD: HIV 1 RNA Quant: 73 copies/mL — ABNORMAL HIGH (ref <20)

## 2012-02-19 ENCOUNTER — Telehealth: Payer: Self-pay | Admitting: *Deleted

## 2012-02-19 NOTE — Telephone Encounter (Signed)
Patient called requesting refill on Percocet.  Explained to him this was a one time prescription prescribed for muscle strain. He was advised to do stretching exercises and avoid constipation.  He said it is not improved, advised to try Ibuprofen.  Told him I will send the note to Dr. Drue Second. Wendall Mola CMA

## 2012-02-22 NOTE — Telephone Encounter (Signed)
Will re-address pain at next visit. thx for giving him advice on otc meds

## 2012-02-26 ENCOUNTER — Telehealth: Payer: Self-pay | Admitting: *Deleted

## 2012-02-26 DIAGNOSIS — R109 Unspecified abdominal pain: Secondary | ICD-10-CM

## 2012-02-26 MED ORDER — OXYCODONE-ACETAMINOPHEN 5-325 MG PO TABS: 1.0000 | ORAL_TABLET | Freq: Three times a day (TID) | ORAL | Status: DC | PRN

## 2012-02-26 NOTE — Telephone Encounter (Signed)
Patient called again requesting a refill on Percocet, he is c/o abdominal pain.  York Spaniel he is taking 12 Ibuprofen a day, and does not want to wait until October to address this.  Please advise Wendall Mola CMA

## 2012-02-26 NOTE — Telephone Encounter (Signed)
Patient notified and will pick up Rx on Monday and schedule appt that day after he arranges transportation. Wendall Mola

## 2012-02-26 NOTE — Telephone Encounter (Signed)
Can give him another rx of percocet #60, please have him come in next week or first week of September for clinical assessment

## 2012-03-15 ENCOUNTER — Ambulatory Visit: Payer: Self-pay

## 2012-04-14 ENCOUNTER — Other Ambulatory Visit: Payer: Self-pay

## 2012-04-14 ENCOUNTER — Ambulatory Visit: Payer: Self-pay

## 2012-04-20 NOTE — Progress Notes (Signed)
HIV CLINIC NOTE  RFV: routine visit Subjective:    [name], male    DOB: 1976-02-14, 36 y.o.   MRN: [id number]  HPI MR Hoey is a 36 yo Male, with HIV/HCV coinfection. CD 4 count of 20(7%)/ VL 1,308,657. Currently on raltegravir/truvada/DRVr since mid April. He is also on OI proph with bactrim and azithromycin. He reports having left leg pain which he things it might be due to crestor he stopped the medication and his leg pains have resolved. He denies fever, chills, nightsweat,n/v/diarrhea. He does report having occ. Wheezing and coughing x 7 days. Mostly in the morning and improves throught the day. He thinks it may be seasonal allergies since he also has rhinorrhea. Current Outpatient Prescriptions on File Prior to Visit  Medication Sig Dispense Refill  . azithromycin (ZITHROMAX) 600 MG tablet Take 2 tablets (1,200 mg total) by mouth once a week.  8 tablet  6  . darunavir (PREZISTA) 600 MG tablet Take 1 tablet (600 mg total) by mouth 2 (two) times daily with a meal.  60 tablet  6  . emtricitabine-tenofovir (TRUVADA) 200-300 MG per tablet Take 1 tablet by mouth daily.  30 tablet  6  . lidocaine-hydrocortisone (ANAMANTLE HC) 3-0.5 % CREA Place 1 Applicatorful rectally 2 (two) times daily.  1 Tube  1  . raltegravir (ISENTRESS) 400 MG tablet Take 1 tablet (400 mg total) by mouth 2 (two) times daily.  60 tablet  6  . ritonavir (NORVIR) 100 MG TABS Take 1 tablet (100 mg total) by mouth 2 (two) times daily with a meal.  60 tablet  6  . sulfamethoxazole-trimethoprim (BACTRIM DS) 800-160 MG per tablet Take 1 tablet by mouth daily.  30 tablet  6  . valACYclovir (VALTREX) 500 MG tablet Take 1 tablet (500 mg total) by mouth 2 (two) times daily.  60 tablet  6  . rosuvastatin (CRESTOR) 20 MG tablet Take 1 tablet (20 mg total) by mouth at bedtime.  30 tablet  11   Active Ambulatory Problems    Diagnosis Date Noted  . Human immunodeficiency virus (HIV) disease 10/27/2011  . HTN  (hypertension) 10/27/2011  . Hypertriglyceridemia 10/27/2011  . Herpes 10/27/2011   Resolved Ambulatory Problems    Diagnosis Date Noted  . No Resolved Ambulatory Problems   Past Medical History  Diagnosis Date  . Herpes genitalis in men   . HIV disease   . Hyperlipidemia   . Hypertension        Review of Systems See hpi, postiive nad negative pertinents    Objective:   Physical Exam  BP 166/91  Pulse 98  Temp 98.4 F (36.9 C) (Oral)  Wt 234 lb (106.142 kg) Physical Exam  Constitutional: He is oriented to person, place, and time. He appears well-developed and well-nourished. No distress.  HENT:  Mouth/Throat: Oropharynx is clear and moist. No oropharyngeal exudate.  Cardiovascular: Normal rate, regular rhythm and normal heart sounds. Exam reveals no gallop and no friction rub.  No murmur heard.  Pulmonary/Chest: Effort normal and breath sounds normal. No respiratory distress. He has no wheezes.  Abdominal: Soft. Bowel sounds are normal. He exhibits no distension. There is no tenderness.  Lymphadenopathy:  He has no cervical adenopathy.  Neurological: He is alert and oriented to person, place, and time.  Skin: Skin is warm and dry. No rash noted. No erythema.  Psychiatric: He has a normal mood and affect. His behavior is normal.  Assessment & Plan:  HIV = will check laps to see how his numbers look  Hyperlipidema= will discontinue crestor, and start fish oil BID  URI = likely viral, will ask to have patient call back if having worsening of symptoms  Health maintenance = will have him doe flu vax in the [facility]

## 2012-04-21 ENCOUNTER — Ambulatory Visit: Payer: Self-pay | Admitting: Internal Medicine

## 2012-04-25 ENCOUNTER — Other Ambulatory Visit: Payer: Self-pay

## 2012-04-25 ENCOUNTER — Ambulatory Visit: Payer: Self-pay

## 2012-04-26 ENCOUNTER — Telehealth: Payer: Self-pay | Admitting: *Deleted

## 2012-04-26 NOTE — Telephone Encounter (Signed)
Called patient to try and reschedule his missed lab appt and he was not available nor was I able to leave a message.

## 2012-04-28 ENCOUNTER — Ambulatory Visit: Payer: Self-pay | Admitting: Internal Medicine

## 2012-05-26 ENCOUNTER — Ambulatory Visit: Payer: Self-pay | Admitting: Internal Medicine

## 2012-05-30 ENCOUNTER — Other Ambulatory Visit: Payer: Self-pay | Admitting: *Deleted

## 2012-05-30 ENCOUNTER — Ambulatory Visit: Payer: Self-pay

## 2012-05-30 ENCOUNTER — Other Ambulatory Visit: Payer: Self-pay | Admitting: Licensed Clinical Social Worker

## 2012-05-30 DIAGNOSIS — B20 Human immunodeficiency virus [HIV] disease: Secondary | ICD-10-CM

## 2012-05-30 MED ORDER — RITONAVIR 100 MG PO TABS: 100.0000 mg | ORAL_TABLET | Freq: Two times a day (BID) | ORAL | Status: DC

## 2012-05-30 MED ORDER — DARUNAVIR ETHANOLATE 800 MG PO TABS: 800.0000 mg | ORAL_TABLET | Freq: Every day | ORAL | Status: DC

## 2012-05-30 MED ORDER — EMTRICITABINE-TENOFOVIR DF 200-300 MG PO TABS: 1.0000 | ORAL_TABLET | Freq: Every day | ORAL | Status: DC

## 2012-05-30 MED ORDER — RALTEGRAVIR POTASSIUM 400 MG PO TABS: 400.0000 mg | ORAL_TABLET | Freq: Two times a day (BID) | ORAL | Status: DC

## 2012-05-30 NOTE — Telephone Encounter (Signed)
Error - scripts did not print

## 2012-05-31 ENCOUNTER — Ambulatory Visit: Payer: Self-pay

## 2012-06-07 ENCOUNTER — Other Ambulatory Visit (INDEPENDENT_AMBULATORY_CARE_PROVIDER_SITE_OTHER): Payer: Self-pay

## 2012-06-07 ENCOUNTER — Ambulatory Visit: Payer: Self-pay

## 2012-06-07 DIAGNOSIS — B20 Human immunodeficiency virus [HIV] disease: Secondary | ICD-10-CM

## 2012-06-07 LAB — CBC WITH DIFFERENTIAL/PLATELET
Basophils Absolute: 0 10*3/uL (ref 0.0–0.1)
Basophils Relative: 0 % (ref 0–1)
Eosinophils Relative: 1 % (ref 0–5)
HCT: 44.2 % (ref 39.0–52.0)
MCHC: 35.7 g/dL (ref 30.0–36.0)
Monocytes Absolute: 0.4 10*3/uL (ref 0.1–1.0)
Neutro Abs: 2.2 10*3/uL (ref 1.7–7.7)
Platelets: 190 10*3/uL (ref 150–400)
RDW: 13.9 % (ref 11.5–15.5)

## 2012-06-07 LAB — COMPLETE METABOLIC PANEL WITH GFR
ALT: 9 U/L (ref 0–53)
AST: 10 U/L (ref 0–37)
Albumin: 4.1 g/dL (ref 3.5–5.2)
Alkaline Phosphatase: 80 U/L (ref 39–117)
BUN: 8 mg/dL (ref 6–23)
CO2: 25 mEq/L (ref 19–32)
Calcium: 9.1 mg/dL (ref 8.4–10.5)
Chloride: 107 mEq/L (ref 96–112)
Creat: 0.97 mg/dL (ref 0.50–1.35)
GFR, Est African American: >89 mL/min
GFR, Est Non African American: >89 mL/min
Glucose, Bld: 89 mg/dL (ref 70–99)
Potassium: 3.6 mEq/L (ref 3.5–5.3)
Sodium: 139 mEq/L (ref 135–145)
Total Bilirubin: 0.4 mg/dL (ref 0.3–1.2)
Total Protein: 7.2 g/dL (ref 6.0–8.3)

## 2012-06-07 NOTE — Progress Notes (Signed)
Pt here for labs and requested refill of Percocet.  Per the last documented conversation via phone he will need office visit with Dr Drue Second to discuss the refills.  I will notify the patient.    Pt was informed Dr Drue Second will need to determine the source of pain.  At this point he will need an assessment . No refills at this time.  He states he is starting a new job and the percocet is the only medication that is keeping him going at this time.    [name], RN

## 2012-06-16 ENCOUNTER — Other Ambulatory Visit: Payer: Self-pay | Admitting: *Deleted

## 2012-06-16 ENCOUNTER — Encounter: Payer: Self-pay | Admitting: Internal Medicine

## 2012-06-16 ENCOUNTER — Ambulatory Visit (INDEPENDENT_AMBULATORY_CARE_PROVIDER_SITE_OTHER): Payer: Self-pay | Admitting: Internal Medicine

## 2012-06-16 VITALS — BP 162/111 | HR 102 | Temp 98.4°F | Ht 72.0 in | Wt 224.2 lb

## 2012-06-16 DIAGNOSIS — M7918 Myalgia, other site: Secondary | ICD-10-CM

## 2012-06-16 DIAGNOSIS — Z23 Encounter for immunization: Secondary | ICD-10-CM

## 2012-06-16 DIAGNOSIS — B20 Human immunodeficiency virus [HIV] disease: Secondary | ICD-10-CM

## 2012-06-16 DIAGNOSIS — IMO0001 Reserved for inherently not codable concepts without codable children: Secondary | ICD-10-CM

## 2012-06-16 DIAGNOSIS — Z21 Asymptomatic human immunodeficiency virus [HIV] infection status: Secondary | ICD-10-CM

## 2012-06-16 MED ORDER — DARUNAVIR ETHANOLATE 800 MG PO TABS: 800.0000 mg | ORAL_TABLET | Freq: Every day | ORAL | Status: DC

## 2012-06-16 MED ORDER — NAPROXEN 500 MG PO TABS: 500.0000 mg | ORAL_TABLET | Freq: Two times a day (BID) | ORAL | Status: DC

## 2012-06-16 MED ORDER — CYCLOBENZAPRINE HCL 10 MG PO TABS: 10.0000 mg | ORAL_TABLET | Freq: Two times a day (BID) | ORAL | Status: DC | PRN

## 2012-06-16 MED ORDER — RITONAVIR 100 MG PO TABS: 100.0000 mg | ORAL_TABLET | Freq: Every day | ORAL | Status: DC

## 2012-06-16 NOTE — Progress Notes (Signed)
[facility]  RFV: routine visit  Subjective:    [name], male    DOB: 07-Oct-1975, 36 y.o.   MRN: [id number]  HPI 36yo Male with HIV, CD 4 count of 150(15%), VL < 20, currently on salvage therapy with raltegravir/truvada/DRV/r. Takes oi proph with bactrim.  He reports having 3-4 month history of back pain, where he was last prescribed vicodin which helped him considerably. He states that his back pain was precipitated by moving furniture. Pain is dull ache but occasional is sharp, ? Not clear if it radiates.  He also states he notices having hallucinations at bedtime and he thinks it is due to [place].  Current Outpatient Prescriptions on File Prior to Visit  Medication Sig Dispense Refill  . azithromycin (ZITHROMAX) 600 MG tablet Take 2 tablets (1,200 mg total) by mouth once a week.  8 tablet  6  . Darunavir Ethanolate (PREZISTA) 800 MG tablet Take 1 tablet (800 mg total) by mouth daily with breakfast.  30 tablet  6  . emtricitabine-tenofovir (TRUVADA) 200-300 MG per tablet Take 1 tablet by mouth daily.  30 tablet  6  . raltegravir (ISENTRESS) 400 MG tablet Take 1 tablet (400 mg total) by mouth 2 (two) times daily.  60 tablet  6  . ritonavir (NORVIR) 100 MG TABS Take 1 tablet (100 mg total) by mouth 2 (two) times daily with a meal.  60 tablet  6  . rosuvastatin (CRESTOR) 20 MG tablet Take 1 tablet (20 mg total) by mouth at bedtime.  30 tablet  11  . sulfamethoxazole-trimethoprim (BACTRIM DS) 800-160 MG per tablet Take 1 tablet by mouth daily.  30 tablet  6  . valACYclovir (VALTREX) 500 MG tablet Take 1 tablet (500 mg total) by mouth 2 (two) times daily.  60 tablet  6  . lidocaine-hydrocortisone (ANAMANTLE HC) 3-0.5 % CREA Place 1 Applicatorful rectally 2 (two) times daily.  1 Tube  1  . oxyCODONE-acetaminophen (PERCOCET/ROXICET) 5-325 MG per tablet Take 1 tablet by mouth every 8 (eight) hours as needed.  60 tablet  0   Active Ambulatory Problems    Diagnosis Date Noted   . Human immunodeficiency virus (HIV) disease 10/27/2011  . HTN (hypertension) 10/27/2011  . Hypertriglyceridemia 10/27/2011  . Herpes 10/27/2011   Resolved Ambulatory Problems    Diagnosis Date Noted  . No Resolved Ambulatory Problems   Past Medical History  Diagnosis Date  . Herpes genitalis in men   . HIV disease   . Hyperlipidemia   . Hypertension    Social hx: not currently working. Smokes marijuana    Review of Systems 10 point ROS otherwise negative except what is mentioned in HPI    Objective:   Physical Exam  BP 162/111  Pulse 102  Temp 98.4 F (36.9 C) (Oral)  Ht 6' (1.829 m)  Wt 224 lb 4 oz (101.719 kg)  BMI 30.41 kg/m2 Physical Exam  Constitutional: He is oriented to person, place, and time. He appears well-developed and well-nourished. No distress.  HENT:  Mouth/Throat: Oropharynx is clear and moist. No oropharyngeal exudate.  Cardiovascular: Normal rate, regular rhythm and normal heart sounds. Exam reveals no gallop and no friction rub.  No murmur heard.  Pulmonary/Chest: Effort normal and breath sounds normal. No respiratory distress. He has no wheezes.  Abdominal: Soft. Bowel sounds are normal. He exhibits no distension. There is no tenderness.  Lymphadenopathy:  He has no cervical adenopathy.  Back = tenderness to palpation to left  flank paraspinal area. MSK+,   Skin: Skin is warm and dry. No rash noted. No erythema.  Psychiatric: He has a normal mood and affect. His behavior is normal.       Assessment & Plan:  HIV = continue with truvada/darunavir/ritonavir/ raltegravir.   Hallucinations= not completely convinced that it is due to truvada. Will ask him to continue taking meds as directed. Avoid smoking marijuana at night/concurrent with truvada  oi proph= continue with bactrim, can stop taking azithromycin  MSK pain = will do a trial of flexeril and naprosyn.  Hypertriglyceridemia = continue with rosuvastatin and fish oil. Check lipids at  next visit  Health maintenance = already received flu vaccine  rtc in 1 month for follow up on MSK complaints

## 2012-06-21 ENCOUNTER — Ambulatory Visit: Payer: Self-pay | Admitting: Internal Medicine

## 2012-07-19 ENCOUNTER — Ambulatory Visit: Payer: Self-pay | Admitting: Internal Medicine

## 2012-07-27 ENCOUNTER — Ambulatory Visit: Payer: Self-pay | Admitting: Internal Medicine

## 2012-08-16 ENCOUNTER — Encounter: Payer: Self-pay | Admitting: Internal Medicine

## 2012-08-16 ENCOUNTER — Ambulatory Visit (INDEPENDENT_AMBULATORY_CARE_PROVIDER_SITE_OTHER): Payer: Self-pay | Admitting: Internal Medicine

## 2012-08-16 VITALS — BP 158/101 | HR 83 | Temp 97.7°F | Ht 72.0 in | Wt 216.0 lb

## 2012-08-16 DIAGNOSIS — B20 Human immunodeficiency virus [HIV] disease: Secondary | ICD-10-CM

## 2012-08-16 MED ORDER — SULFAMETHOXAZOLE-TMP DS 800-160 MG PO TABS: 1.0000 | ORAL_TABLET | Freq: Every day | ORAL | Status: DC

## 2012-08-16 MED ORDER — HYDROCHLOROTHIAZIDE 25 MG PO TABS: 25.0000 mg | ORAL_TABLET | Freq: Every day | ORAL | Status: DC

## 2012-08-16 NOTE — Progress Notes (Signed)
[facility]  RFV: sick visit  Subjective:    [name], male    DOB: 12-18-1975, 37 y.o.   MRN: [id number]  HPI 37 yo Male with HIV, CD 4 count 150/VL <20 in late Nov 2013, on raltegravir/truvada/DRVr and he remembers that bactrim did not get sent to him this month.lLast seen in December for back strain/LBP. He presents with cold like symptoms for the past 2 wks, productive cough, and wheezing but no fevers. He does have chronic cough at baseline from smoking but now noticing that he has increased productive cough, and today has slight decrease hearing to right ear. No sick contacts  Current Outpatient Prescriptions on File Prior to Visit  Medication Sig Dispense Refill  . cyclobenzaprine (FLEXERIL) 10 MG tablet Take 1 tablet (10 mg total) by mouth 2 (two) times daily as needed for muscle spasms.  30 tablet  0  . Darunavir Ethanolate (PREZISTA) 800 MG tablet Take 1 tablet (800 mg total) by mouth daily with breakfast.  30 tablet  6  . emtricitabine-tenofovir (TRUVADA) 200-300 MG per tablet Take 1 tablet by mouth daily.  30 tablet  6  . lidocaine-hydrocortisone (ANAMANTLE HC) 3-0.5 % CREA Place 1 Applicatorful rectally 2 (two) times daily.  1 Tube  1  . naproxen (NAPROSYN) 500 MG tablet Take 1 tablet (500 mg total) by mouth 2 (two) times daily with a meal.  60 tablet  1  . oxyCODONE-acetaminophen (PERCOCET/ROXICET) 5-325 MG per tablet Take 1 tablet by mouth every 8 (eight) hours as needed.  60 tablet  0  . raltegravir (ISENTRESS) 400 MG tablet Take 1 tablet (400 mg total) by mouth 2 (two) times daily.  60 tablet  6  . ritonavir (NORVIR) 100 MG TABS Take 1 tablet (100 mg total) by mouth daily with breakfast.  30 tablet  11  . rosuvastatin (CRESTOR) 20 MG tablet Take 1 tablet (20 mg total) by mouth at bedtime.  30 tablet  11  . sulfamethoxazole-trimethoprim (BACTRIM DS) 800-160 MG per tablet Take 1 tablet by mouth daily.  30 tablet  6  . valACYclovir (VALTREX) 500 MG tablet Take 1  tablet (500 mg total) by mouth 2 (two) times daily.  60 tablet  6   Active Ambulatory Problems    Diagnosis Date Noted  . Human immunodeficiency virus (HIV) disease 10/27/2011  . HTN (hypertension) 10/27/2011  . Hypertriglyceridemia 10/27/2011  . Herpes 10/27/2011   Resolved Ambulatory Problems    Diagnosis Date Noted  . No Resolved Ambulatory Problems   Past Medical History  Diagnosis Date  . Herpes genitalis in men   . HIV disease   . Hyperlipidemia   . Hypertension         Review of Systems     Objective:   Physical Exam BP 158/101  Pulse 83  Temp 97.7 F (36.5 C) (Oral)  Ht 6' (1.829 m)  Wt 216 lb (97.977 kg)  BMI 29.29 kg/m2  Constitutional: He is oriented to person, place, and time. He appears well-developed and well-nourished. No distress.  HENT: poor dentition to back molars;TM clear Mouth/Throat: Oropharynx is clear and moist. No oropharyngeal exudate.  Cardiovascular: Normal rate, regular rhythm and normal heart sounds. Exam reveals no gallop and no friction rub.  No murmur heard.  Pulmonary/Chest: Effort normal and breath sounds normal. No respiratory distress. He has no wheezes.  Abdominal: Soft. Bowel sounds are normal. He exhibits no distension. There is no tenderness.  Lymphadenopathy:  He has  no cervical adenopathy.  Neurological: He is alert and oriented to person, place, and time.  Skin: Skin is warm and dry. No rash noted. No erythema.  Psychiatric: He has a normal mood and affect. His behavior is normal.        Assessment & Plan:   HIV = continues to do well with hiv meds  HTN= will start HCTZ 25mg  daily  oi proph= will restart taking bactrim.  Upper respiratory infection likely viral etiology = will give him recommendations for mucinex, and robitussin  Back pain = tolerable, no need for addn meds at this time  rtc in 2 months

## 2012-09-30 ENCOUNTER — Ambulatory Visit: Payer: Self-pay

## 2012-10-12 ENCOUNTER — Encounter: Payer: Self-pay | Admitting: *Deleted

## 2012-11-07 ENCOUNTER — Other Ambulatory Visit: Payer: Self-pay | Admitting: *Deleted

## 2012-11-07 DIAGNOSIS — B009 Herpesviral infection, unspecified: Secondary | ICD-10-CM

## 2012-11-07 MED ORDER — VALACYCLOVIR HCL 500 MG PO TABS: 500.0000 mg | ORAL_TABLET | Freq: Two times a day (BID) | ORAL | Status: DC

## 2012-11-10 ENCOUNTER — Ambulatory Visit: Payer: Self-pay

## 2012-11-16 ENCOUNTER — Other Ambulatory Visit: Payer: Self-pay | Admitting: Infectious Diseases

## 2012-11-16 ENCOUNTER — Other Ambulatory Visit: Payer: Self-pay | Admitting: *Deleted

## 2012-11-16 ENCOUNTER — Other Ambulatory Visit (INDEPENDENT_AMBULATORY_CARE_PROVIDER_SITE_OTHER): Payer: Self-pay

## 2012-11-16 ENCOUNTER — Encounter: Payer: Self-pay | Admitting: *Deleted

## 2012-11-16 DIAGNOSIS — B20 Human immunodeficiency virus [HIV] disease: Secondary | ICD-10-CM

## 2012-11-16 DIAGNOSIS — Z113 Encounter for screening for infections with a predominantly sexual mode of transmission: Secondary | ICD-10-CM

## 2012-11-16 DIAGNOSIS — Z79899 Other long term (current) drug therapy: Secondary | ICD-10-CM

## 2012-11-16 LAB — LIPID PANEL
Cholesterol: 171 mg/dL (ref 0–200)
LDL Cholesterol: 65 mg/dL (ref 0–99)
VLDL: 70 mg/dL — ABNORMAL HIGH (ref 0–40)

## 2012-11-16 LAB — COMPREHENSIVE METABOLIC PANEL
AST: 13 U/L (ref 0–37)
Albumin: 3.8 g/dL (ref 3.5–5.2)
Alkaline Phosphatase: 91 U/L (ref 39–117)
Chloride: 110 mEq/L (ref 96–112)
Glucose, Bld: 79 mg/dL (ref 70–99)
Potassium: 4.3 mEq/L (ref 3.5–5.3)
Sodium: 140 mEq/L (ref 135–145)
Total Protein: 7.2 g/dL (ref 6.0–8.3)

## 2012-11-16 LAB — CBC WITH DIFFERENTIAL/PLATELET
Basophils Relative: 1 % (ref 0–1)
Hemoglobin: 15.1 g/dL (ref 13.0–17.0)
Lymphocytes Relative: 23 % (ref 12–46)
MCHC: 35 g/dL (ref 30.0–36.0)
Monocytes Relative: 16 % — ABNORMAL HIGH (ref 3–12)
Neutro Abs: 2.4 10*3/uL (ref 1.7–7.7)
Neutrophils Relative %: 58 % (ref 43–77)
RBC: 4.72 MIL/uL (ref 4.22–5.81)
WBC: 4.1 10*3/uL (ref 4.0–10.5)

## 2012-11-16 MED ORDER — RITONAVIR 100 MG PO TABS: 100.0000 mg | ORAL_TABLET | Freq: Every day | ORAL | Status: DC

## 2012-11-16 MED ORDER — DARUNAVIR ETHANOLATE 800 MG PO TABS: 800.0000 mg | ORAL_TABLET | Freq: Every day | ORAL | Status: DC

## 2012-11-16 MED ORDER — RALTEGRAVIR POTASSIUM 400 MG PO TABS: 400.0000 mg | ORAL_TABLET | Freq: Two times a day (BID) | ORAL | Status: DC

## 2012-11-16 MED ORDER — EMTRICITABINE-TENOFOVIR DF 200-300 MG PO TABS: 1.0000 | ORAL_TABLET | Freq: Every day | ORAL | Status: DC

## 2012-11-16 NOTE — Progress Notes (Signed)
[name], male   DOB: 10-Jul-1976, 37 y.o.   MRN: [id number] ADAP renewal completed this AM.  Will take at least 2 weeks for ADAP approval.  Pt stated that he does not drive and depends on his brothers for transportation.  His brothers have not been able to provide transportation.  RN offered bus passes but the pt does not live near a bus line.  Pt is out ot 2 HIV rxes.  Pt reported taking 2 out of 4 HIV rxes.  RN advised pt that he should stop all HIV rxes when he doesn't have them all to take.  Explained the possibility of developing resistance to medications.  Pt needed return appts with Dr. Drue Second.  Scheduled lab and MD appts for pt.  Pt verbalized understanding of stopping all HIV medications when he did not have them all.

## 2012-11-16 NOTE — Patient Instructions (Signed)
Pt instructed to stop all HIV rxes until his ADAP approval comes through and he receives all of his medications.

## 2012-11-18 LAB — HIV-1 RNA QUANT-NO REFLEX-BLD
HIV 1 RNA Quant: 2114 copies/mL — ABNORMAL HIGH (ref <20)
HIV-1 RNA Quant, Log: 3.33 {Log} — ABNORMAL HIGH (ref <1.30)

## 2012-11-24 ENCOUNTER — Ambulatory Visit (INDEPENDENT_AMBULATORY_CARE_PROVIDER_SITE_OTHER): Payer: Self-pay | Admitting: Internal Medicine

## 2012-11-24 ENCOUNTER — Encounter: Payer: Self-pay | Admitting: Internal Medicine

## 2012-11-24 VITALS — BP 143/97 | HR 88 | Temp 98.2°F | Wt 228.0 lb

## 2012-11-24 DIAGNOSIS — B20 Human immunodeficiency virus [HIV] disease: Secondary | ICD-10-CM

## 2012-11-24 DIAGNOSIS — E781 Pure hyperglyceridemia: Secondary | ICD-10-CM

## 2012-11-24 MED ORDER — FENOFIBRATE 145 MG PO TABS
145.0000 mg | ORAL_TABLET | Freq: Every day | ORAL | Status: DC
Start: 2012-11-24 — End: 2014-11-06

## 2012-11-24 NOTE — Progress Notes (Signed)
[facility]  RFV: routine visit Subjective:    [name], male    DOB: 1975/08/21, 37 y.o.   MRN: [id number]  HPI 37 yo Male -> Male with HIV, CD 4 count of 170/VL 2114 , (ran out of meds and ADAP lapse roughly 1 wk) ,now off of regimen 2.5 wks on truvada/DRVr/RAL.  Had a day's worth of fever and chills but then resolved spontaneously  3 weeks ago noticed having numbness (initially tingling sensation but now numbness of left back, left arm, and left buttock sparing leg, sparing shoulder and neck) not worse, not improved which is concerning.  No rash associated with this neuropathy. No weakness.  Current Outpatient Prescriptions on File Prior to Visit  Medication Sig Dispense Refill  . cyclobenzaprine (FLEXERIL) 10 MG tablet Take 1 tablet (10 mg total) by mouth 2 (two) times daily as needed for muscle spasms.  30 tablet  0  . Darunavir Ethanolate (PREZISTA) 800 MG tablet Take 1 tablet (800 mg total) by mouth daily with breakfast.  30 tablet  6  . emtricitabine-tenofovir (TRUVADA) 200-300 MG per tablet Take 1 tablet by mouth daily.  30 tablet  6  . hydrochlorothiazide (HYDRODIURIL) 25 MG tablet Take 1 tablet (25 mg total) by mouth daily.  30 tablet  11  . lidocaine-hydrocortisone (ANAMANTLE HC) 3-0.5 % CREA Place 1 Applicatorful rectally 2 (two) times daily.  1 Tube  1  . naproxen (NAPROSYN) 500 MG tablet Take 1 tablet (500 mg total) by mouth 2 (two) times daily with a meal.  60 tablet  1  . oxyCODONE-acetaminophen (PERCOCET/ROXICET) 5-325 MG per tablet Take 1 tablet by mouth every 8 (eight) hours as needed.  60 tablet  0  . raltegravir (ISENTRESS) 400 MG tablet Take 1 tablet (400 mg total) by mouth 2 (two) times daily.  60 tablet  6  . ritonavir (NORVIR) 100 MG TABS Take 1 tablet (100 mg total) by mouth daily with breakfast.  30 tablet  11  . sulfamethoxazole-trimethoprim (BACTRIM DS) 800-160 MG per tablet Take 1 tablet by mouth daily.  30 tablet  6  . valACYclovir  (VALTREX) 500 MG tablet Take 1 tablet (500 mg total) by mouth 2 (two) times daily.  60 tablet  6  . rosuvastatin (CRESTOR) 20 MG tablet Take 1 tablet (20 mg total) by mouth at bedtime.  30 tablet  11   No current facility-administered medications on file prior to visit.   Active Ambulatory Problems    Diagnosis Date Noted  . Human immunodeficiency virus (HIV) disease 10/27/2011  . HTN (hypertension) 10/27/2011  . Hypertriglyceridemia 10/27/2011  . Herpes 10/27/2011   Resolved Ambulatory Problems    Diagnosis Date Noted  . No Resolved Ambulatory Problems   Past Medical History  Diagnosis Date  . Herpes genitalis in men   . HIV disease   . Hyperlipidemia   . Hypertension          Review of Systems nightsweats returned since off of being HIV meds. Plus neuropathy as mentioned in HPI    Objective:   Physical Exam BP 143/97  Pulse 88  Temp(Src) 98.2 F (36.8 C) (Oral)  Wt 228 lb (103.42 kg)  BMI 30.92 kg/m2 Physical Exam  Constitutional: He is oriented to person, place, and time. He appears well-developed and well-nourished. No distress.  HENT:  Mouth/Throat: Oropharynx is clear and moist. No oropharyngeal exudate.  Cardiovascular: Normal rate, regular rhythm and normal heart sounds. Exam reveals no gallop  and no friction rub.  No murmur heard.  Pulmonary/Chest: Effort normal and breath sounds normal. No respiratory distress. He has no wheezes.  Abdominal: Soft. Bowel sounds are normal. He exhibits no distension. There is no tenderness.  Lymphadenopathy:  He has no cervical adenopathy.  Neurological: He is alert and oriented to person, place, and time.  Skin: Skin is warm and dry. No rash noted. No erythema.  Psychiatric: He has a normal mood and affect. His behavior is normal.      Assessment & Plan:  1) HIV will restart old regimen on 5/21 when adap to be approved. At 4 wk if still has elevated VL, then will do genotype, and integrase inhibitor genotype, will also  check [id number].  2) oi proph = continue with bactrim  3) triglyceride : will add fenofibrate daily. Start fish oil supp.  4) neuropathy = T10 distribution on the left. Will refer to neurology. Will have patient get orange card to get MRI as part of work up.

## 2013-01-10 ENCOUNTER — Ambulatory Visit: Payer: Self-pay | Admitting: Internal Medicine

## 2013-01-11 ENCOUNTER — Telehealth: Payer: Self-pay | Admitting: *Deleted

## 2013-01-11 NOTE — Telephone Encounter (Signed)
He has had more than 3 no shows over the past year.  I think we need to tell him about our walk-in clinic. Thanks Asher Muir

## 2013-01-11 NOTE — Telephone Encounter (Signed)
Called patient about missed appt and had to leave a message for him to call the office to reschedule asap.

## 2013-02-27 ENCOUNTER — Ambulatory Visit: Payer: Self-pay

## 2013-02-27 ENCOUNTER — Encounter: Payer: Self-pay | Admitting: Internal Medicine

## 2013-02-27 ENCOUNTER — Ambulatory Visit (INDEPENDENT_AMBULATORY_CARE_PROVIDER_SITE_OTHER): Payer: Self-pay | Admitting: Internal Medicine

## 2013-02-27 VITALS — BP 114/79 | HR 92 | Temp 98.2°F | Ht 72.0 in | Wt 237.0 lb

## 2013-02-27 DIAGNOSIS — B20 Human immunodeficiency virus [HIV] disease: Secondary | ICD-10-CM

## 2013-02-27 DIAGNOSIS — G609 Hereditary and idiopathic neuropathy, unspecified: Secondary | ICD-10-CM

## 2013-02-27 LAB — CBC WITH DIFFERENTIAL/PLATELET
Basophils Absolute: 0 10*3/uL (ref 0.0–0.1)
Basophils Relative: 0 % (ref 0–1)
Eosinophils Absolute: 0.1 10*3/uL (ref 0.0–0.7)
Eosinophils Relative: 3 % (ref 0–5)
HCT: 41.9 % (ref 39.0–52.0)
MCHC: 35.8 g/dL (ref 30.0–36.0)
Monocytes Absolute: 0.4 10*3/uL (ref 0.1–1.0)
Neutro Abs: 1.5 10*3/uL — ABNORMAL LOW (ref 1.7–7.7)
RDW: 15.1 % (ref 11.5–15.5)

## 2013-02-27 MED ORDER — GABAPENTIN 600 MG PO TABS: 600.0000 mg | ORAL_TABLET | Freq: Two times a day (BID) | ORAL | Status: DC

## 2013-02-27 NOTE — Progress Notes (Signed)
[facility]  RFV: routine Subjective:    [name], male    DOB: [birth year], 37 y.o.   MRN: [id number]  HPI 37 yo M->F, on HIV, CD 4 count of 170/VL 2114,RAL/truvada/DRVr also OI proph of bactrim. Also has ongoing neuropathy, numbness to tricep, but also involving down back, not worse. Only concern that it is ongoing.  Current Outpatient Prescriptions on File Prior to Visit  Medication Sig Dispense Refill  . Darunavir Ethanolate (PREZISTA) 800 MG tablet Take 1 tablet (800 mg total) by mouth daily with breakfast.  30 tablet  6  . emtricitabine-tenofovir (TRUVADA) 200-300 MG per tablet Take 1 tablet by mouth daily.  30 tablet  6  . fenofibrate (TRICOR) 145 MG tablet Take 1 tablet (145 mg total) by mouth daily.  30 tablet  11  . hydrochlorothiazide (HYDRODIURIL) 25 MG tablet Take 1 tablet (25 mg total) by mouth daily.  30 tablet  11  . raltegravir (ISENTRESS) 400 MG tablet Take 1 tablet (400 mg total) by mouth 2 (two) times daily.  60 tablet  6  . ritonavir (NORVIR) 100 MG TABS Take 1 tablet (100 mg total) by mouth daily with breakfast.  30 tablet  11  . sulfamethoxazole-trimethoprim (BACTRIM DS) 800-160 MG per tablet Take 1 tablet by mouth daily.  30 tablet  6  . valACYclovir (VALTREX) 500 MG tablet Take 1 tablet (500 mg total) by mouth 2 (two) times daily.  60 tablet  6   No current facility-administered medications on file prior to visit.   Active Ambulatory Problems    Diagnosis Date Noted  . Human immunodeficiency virus (HIV) disease 10/27/2011  . HTN (hypertension) 10/27/2011  . Hypertriglyceridemia 10/27/2011  . Herpes 10/27/2011   Resolved Ambulatory Problems    Diagnosis Date Noted  . No Resolved Ambulatory Problems   Past Medical History  Diagnosis Date  . Herpes genitalis in men   . HIV disease   . Hyperlipidemia   . Hypertension    Social hx: 1/2 PPD    Review of Systems Other than what is mentioned in hpi, 12 point ROs is negative     Objective:   Physical Exam BP 114/79  Pulse 92  Temp(Src) 98.2 F (36.8 C) (Oral)  Ht 6' (1.829 m)  Wt 237 lb (107.502 kg)  BMI 32.14 kg/m2 Physical Exam  Constitutional: He is oriented to person, place, and time. He appears well-developed and well-nourished. No distress.  HENT:  Mouth/Throat: Oropharynx is clear and moist. No oropharyngeal exudate.  Cardiovascular: Normal rate, regular rhythm and normal heart sounds. Exam reveals no gallop and no friction rub.  No murmur heard.  Pulmonary/Chest: Effort normal and breath sounds normal. No respiratory distress. He has no wheezes.  Abdominal: Soft. Bowel sounds are normal. He exhibits no distension. There is no tenderness.  Lymphadenopathy:  He has no cervical adenopathy.  Neurological: He is alert and oriented to person, place, and time. Motor exam is 5/5 grip strength Skin: Skin is warm and dry. No rash noted. No erythema.  Psychiatric: He has a normal mood and affect. His behavior is normal.          Assessment & Plan:  New onset neuropathy = will start neurontin plus get imaging on thoracic and lumbar spine to see if any abn to account for findings. Refer to neurology  hiv = continue on current regimen RAL/truvada/DRVr daily. Will check labs today  hyperlipidemia = continue on fenofibrate

## 2013-02-28 LAB — COMPREHENSIVE METABOLIC PANEL
ALT: 16 U/L (ref 0–53)
AST: 15 U/L (ref 0–37)
Calcium: 9.6 mg/dL (ref 8.4–10.5)
Chloride: 100 mEq/L (ref 96–112)
Creat: 1.52 mg/dL — ABNORMAL HIGH (ref 0.50–1.35)
Potassium: 3.9 mEq/L (ref 3.5–5.3)

## 2013-03-01 LAB — HIV-1 RNA ULTRAQUANT REFLEX TO GENTYP+
HIV 1 RNA Quant: 79 copies/mL — ABNORMAL HIGH (ref <20)
HIV-1 RNA Quant, Log: 1.9 {Log} — ABNORMAL HIGH (ref <1.30)

## 2013-03-02 ENCOUNTER — Other Ambulatory Visit: Payer: Self-pay | Admitting: Licensed Clinical Social Worker

## 2013-03-02 DIAGNOSIS — B20 Human immunodeficiency virus [HIV] disease: Secondary | ICD-10-CM

## 2013-03-02 MED ORDER — DARUNAVIR ETHANOLATE 800 MG PO TABS: 800.0000 mg | ORAL_TABLET | Freq: Every day | ORAL | Status: DC

## 2013-05-01 ENCOUNTER — Other Ambulatory Visit: Payer: Self-pay | Admitting: *Deleted

## 2013-05-01 DIAGNOSIS — B20 Human immunodeficiency virus [HIV] disease: Secondary | ICD-10-CM

## 2013-05-01 DIAGNOSIS — B009 Herpesviral infection, unspecified: Secondary | ICD-10-CM

## 2013-05-01 MED ORDER — SULFAMETHOXAZOLE-TMP DS 800-160 MG PO TABS: 1.0000 | ORAL_TABLET | Freq: Every day | ORAL | Status: DC

## 2013-05-01 MED ORDER — VALACYCLOVIR HCL 500 MG PO TABS: 500.0000 mg | ORAL_TABLET | Freq: Two times a day (BID) | ORAL | Status: DC

## 2013-05-16 ENCOUNTER — Other Ambulatory Visit (INDEPENDENT_AMBULATORY_CARE_PROVIDER_SITE_OTHER): Payer: Self-pay

## 2013-05-16 ENCOUNTER — Ambulatory Visit (INDEPENDENT_AMBULATORY_CARE_PROVIDER_SITE_OTHER): Payer: Self-pay | Admitting: *Deleted

## 2013-05-16 DIAGNOSIS — Z113 Encounter for screening for infections with a predominantly sexual mode of transmission: Secondary | ICD-10-CM

## 2013-05-16 DIAGNOSIS — Z23 Encounter for immunization: Secondary | ICD-10-CM

## 2013-05-16 DIAGNOSIS — B20 Human immunodeficiency virus [HIV] disease: Secondary | ICD-10-CM

## 2013-05-16 LAB — CBC WITH DIFFERENTIAL/PLATELET
HCT: 43.3 % (ref 39.0–52.0)
Hemoglobin: 15.4 g/dL (ref 13.0–17.0)
Lymphocytes Relative: 31 % (ref 12–46)
Lymphs Abs: 1.4 10*3/uL (ref 0.7–4.0)
Monocytes Absolute: 0.5 10*3/uL (ref 0.1–1.0)
Monocytes Relative: 11 % (ref 3–12)
Neutro Abs: 2.5 10*3/uL (ref 1.7–7.7)
Platelets: 204 10*3/uL (ref 150–400)
RBC: 4.53 MIL/uL (ref 4.22–5.81)
WBC: 4.6 10*3/uL (ref 4.0–10.5)

## 2013-05-16 LAB — COMPREHENSIVE METABOLIC PANEL
AST: 12 U/L (ref 0–37)
Albumin: 4.1 g/dL (ref 3.5–5.2)
BUN: 15 mg/dL (ref 6–23)
Calcium: 9.4 mg/dL (ref 8.4–10.5)
Chloride: 104 mEq/L (ref 96–112)
Glucose, Bld: 93 mg/dL (ref 70–99)
Potassium: 3.7 mEq/L (ref 3.5–5.3)
Total Protein: 7.5 g/dL (ref 6.0–8.3)

## 2013-05-17 LAB — HIV-1 RNA QUANT-NO REFLEX-BLD
HIV 1 RNA Quant: <20 copies/mL (ref <20)
HIV-1 RNA Quant, Log: <1.3 {Log} (ref <1.30)

## 2013-05-17 LAB — T-HELPER CELL (CD4) - (RCID CLINIC ONLY): CD4 % Helper T Cell: 17 % — ABNORMAL LOW (ref 33–55)

## 2013-05-30 ENCOUNTER — Ambulatory Visit (INDEPENDENT_AMBULATORY_CARE_PROVIDER_SITE_OTHER): Payer: Self-pay | Admitting: Internal Medicine

## 2013-05-30 VITALS — Temp 98.3°F | Wt 242.0 lb

## 2013-05-30 DIAGNOSIS — B20 Human immunodeficiency virus [HIV] disease: Secondary | ICD-10-CM

## 2013-05-30 DIAGNOSIS — I10 Essential (primary) hypertension: Secondary | ICD-10-CM

## 2013-05-30 MED ORDER — DOLUTEGRAVIR SODIUM 50 MG PO TABS: 50.0000 mg | ORAL_TABLET | Freq: Every day | ORAL | Status: DC

## 2013-05-30 MED ORDER — HYDROCHLOROTHIAZIDE 25 MG PO TABS: 25.0000 mg | ORAL_TABLET | Freq: Every day | ORAL | Status: DC

## 2013-05-30 NOTE — Progress Notes (Signed)
[facility] NOTE  RFV: routine Subjective:    [name], male    DOB: Sep 01, 1975, 37 y.o.   MRN: [id number]  HPI Jeremiah Stevens is a 36yo M->F, with HIV, CD 4 count of 230/VL<20, on salvage regimen of RLG/DRVr/turvada still on bactrim and tricor for HLD. Only taking RLG daily instead of BID. He takes tricor only QOD. He reports having a cold for the past month. Taking nyquil to help his symptoms. Nasal congestion, rhinorrhea, phlegm, post nasal drip and fatigue. He doesn't have chills, nightsweats or fevers. Stopped taking neurontin since it caused him to have throbbing headache.  Current Outpatient Prescriptions on File Prior to Visit  Medication Sig Dispense Refill  . Darunavir Ethanolate (PREZISTA) 800 MG tablet Take 1 tablet (800 mg total) by mouth daily with breakfast.  30 tablet  6  . emtricitabine-tenofovir (TRUVADA) 200-300 MG per tablet Take 1 tablet by mouth daily.  30 tablet  6  . fenofibrate (TRICOR) 145 MG tablet Take 1 tablet (145 mg total) by mouth daily.  30 tablet  11  . gabapentin (NEURONTIN) 600 MG tablet Take 1 tablet (600 mg total) by mouth 2 (two) times daily. Start taking at bedtime, 1 tab x 5 days, then start taking twice a day  60 tablet  3  . hydrochlorothiazide (HYDRODIURIL) 25 MG tablet Take 1 tablet (25 mg total) by mouth daily.  30 tablet  11  . raltegravir (ISENTRESS) 400 MG tablet Take 1 tablet (400 mg total) by mouth 2 (two) times daily.  60 tablet  6  . ritonavir (NORVIR) 100 MG TABS Take 1 tablet (100 mg total) by mouth daily with breakfast.  30 tablet  11  . sulfamethoxazole-trimethoprim (BACTRIM DS) 800-160 MG per tablet Take 1 tablet by mouth daily.  30 tablet  6  . valACYclovir (VALTREX) 500 MG tablet Take 1 tablet (500 mg total) by mouth 2 (two) times daily.  60 tablet  6   No current facility-administered medications on file prior to visit.       Review of Systems 10 point ROS is negative, except what is mentioned in HPI    Objective:   Physical Exam Temp(Src) 98.3 F (36.8 C) (Oral)  Wt 242 lb (109.77 kg) Physical Exam  Constitutional: He is oriented to person, place, and time. He appears well-developed and well-nourished. No distress.  HENT:  Mouth/Throat: Oropharynx is clear and moist. No oropharyngeal exudate.  Cardiovascular: Normal rate, regular rhythm and normal heart sounds. Exam reveals no gallop and no friction rub.  No murmur heard.  Pulmonary/Chest: Effort normal and breath sounds normal. No respiratory distress. He has no wheezes.  Abdominal: Soft. Bowel sounds are normal. He exhibits no distension. There is no tenderness.  Lymphadenopathy:  He has no cervical adenopathy.  Neurological: He is alert and oriented to person, place, and time.  Skin: Skin is warm and dry. No rash noted. No erythema.  Psychiatric: He has a normal mood and affect. His behavior is normal.        Assessment & Plan:  HIV = will switch raltegravir to tivicay. His salvage regimen : tivicay/ darunavir/ritonavir/ truvada daily  OI proph = can stop bactrim since 6 month out.  Health maintenance = had flu shot 2 weeks ago.  Neuropathy = as stopped taking neurontin. Appears sporadic. No further intervention at this.  HLD = will check his lipids at next visit  Dental caries = will need to sign up for dental clinic  [facility] in 3 months, and get labs 2 wk prior

## 2013-06-02 ENCOUNTER — Other Ambulatory Visit: Payer: Self-pay | Admitting: *Deleted

## 2013-06-02 DIAGNOSIS — B20 Human immunodeficiency virus [HIV] disease: Secondary | ICD-10-CM

## 2013-06-02 MED ORDER — EMTRICITABINE-TENOFOVIR DF 200-300 MG PO TABS: 1.0000 | ORAL_TABLET | Freq: Every day | ORAL | Status: DC

## 2013-07-03 ENCOUNTER — Other Ambulatory Visit: Payer: Self-pay | Admitting: *Deleted

## 2013-07-03 DIAGNOSIS — B20 Human immunodeficiency virus [HIV] disease: Secondary | ICD-10-CM

## 2013-07-03 DIAGNOSIS — B009 Herpesviral infection, unspecified: Secondary | ICD-10-CM

## 2013-07-03 MED ORDER — RITONAVIR 100 MG PO TABS: 100.0000 mg | ORAL_TABLET | Freq: Every day | ORAL | Status: DC

## 2013-07-27 ENCOUNTER — Ambulatory Visit: Payer: Self-pay | Admitting: Internal Medicine

## 2013-07-27 ENCOUNTER — Ambulatory Visit: Payer: Self-pay

## 2013-07-27 ENCOUNTER — Telehealth: Payer: Self-pay | Admitting: *Deleted

## 2013-07-27 NOTE — Telephone Encounter (Signed)
Unable to contact patient.  Numbers incorrect.

## 2013-08-02 ENCOUNTER — Ambulatory Visit (INDEPENDENT_AMBULATORY_CARE_PROVIDER_SITE_OTHER): Payer: Self-pay | Admitting: Infectious Disease

## 2013-08-02 ENCOUNTER — Encounter: Payer: Self-pay | Admitting: Infectious Disease

## 2013-08-02 VITALS — BP 134/85 | HR 85 | Temp 98.3°F | Wt 248.0 lb

## 2013-08-02 DIAGNOSIS — G43909 Migraine, unspecified, not intractable, without status migrainosus: Secondary | ICD-10-CM

## 2013-08-02 DIAGNOSIS — G909 Disorder of the autonomic nervous system, unspecified: Secondary | ICD-10-CM

## 2013-08-02 DIAGNOSIS — Z21 Asymptomatic human immunodeficiency virus [HIV] infection status: Secondary | ICD-10-CM

## 2013-08-02 DIAGNOSIS — B009 Herpesviral infection, unspecified: Secondary | ICD-10-CM

## 2013-08-02 DIAGNOSIS — G63 Polyneuropathy in diseases classified elsewhere: Secondary | ICD-10-CM

## 2013-08-02 DIAGNOSIS — B20 Human immunodeficiency virus [HIV] disease: Secondary | ICD-10-CM | POA: Insufficient documentation

## 2013-08-02 MED ORDER — ATENOLOL 50 MG PO TABS: 50.0000 mg | ORAL_TABLET | Freq: Every day | ORAL | Status: DC

## 2013-08-02 MED ORDER — DARUNAVIR ETHANOLATE 800 MG PO TABS: 800.0000 mg | ORAL_TABLET | Freq: Every day | ORAL | Status: DC

## 2013-08-02 MED ORDER — WRIST BRACE MISC: 1.0000 "application " | Freq: Every day | Status: DC

## 2013-08-02 MED ORDER — DOLUTEGRAVIR SODIUM 50 MG PO TABS: 50.0000 mg | ORAL_TABLET | Freq: Every day | ORAL | Status: DC

## 2013-08-02 MED ORDER — RITONAVIR 100 MG PO TABS: 100.0000 mg | ORAL_TABLET | Freq: Every day | ORAL | Status: DC

## 2013-08-02 MED ORDER — VALACYCLOVIR HCL 500 MG PO TABS: 500.0000 mg | ORAL_TABLET | Freq: Every day | ORAL | Status: DC

## 2013-08-02 MED ORDER — EMTRICITABINE-TENOFOVIR DF 200-300 MG PO TABS: 1.0000 | ORAL_TABLET | Freq: Every day | ORAL | Status: DC

## 2013-08-02 NOTE — Progress Notes (Signed)
Subjective:    [name], male    DOB: 1976/01/07, [age] y.o.   MRN: [id number]  Headache  Associated symptoms include numbness. Pertinent negatives include no abdominal pain, back pain, coughing, dizziness, fever, nausea, photophobia, rhinorrhea, sinus pressure, sore throat, vomiting or weakness.    Jeremiah Stevens is a 38 y.o. male with HIV infection who is doing superbly well on their  antiviral regimen,  prezista, norvir, truvada and Tivicay iwith undetectable viral load and health cd4 count = 250.  She is here for concerns regarding severe headache, largely right side.   He has had troubles with headaches in the past but this time and the more severe. They have responded to Excedrin but then recur within a few days.  She has not been seen by neurology yet.  I recommended starting atenolol and replacing her hydrochlorothiazide with his medication as a prophylactic medicine.   Review of Systems  Constitutional: Negative for fever, chills, diaphoresis, activity change, appetite change, fatigue and unexpected weight change.  HENT: Negative for congestion, rhinorrhea, sinus pressure, sneezing, sore throat and trouble swallowing.   Eyes: Negative for photophobia and visual disturbance.  Respiratory: Negative for cough, chest tightness, shortness of breath, wheezing and stridor.   Cardiovascular: Negative for chest pain, palpitations and leg swelling.  Gastrointestinal: Negative for nausea, vomiting, abdominal pain, diarrhea, constipation, blood in stool, abdominal distention and anal bleeding.  Genitourinary: Negative for dysuria, hematuria, flank pain and difficulty urinating.  Musculoskeletal: Negative for arthralgias, back pain, gait problem, joint swelling and myalgias.  Skin: Negative for color change, pallor, rash and wound.  Neurological: Positive for numbness and headaches. Negative for dizziness, tremors, weakness and light-headedness.  Hematological: Negative for  adenopathy. Does not bruise/bleed easily.  Psychiatric/Behavioral: Negative for behavioral problems, confusion, sleep disturbance, dysphoric mood, decreased concentration and agitation.       Objective:   Physical Exam  Constitutional: He is oriented to person, place, and time. He appears well-developed and well-nourished. No distress.  HENT:  Head: Normocephalic and atraumatic.  Mouth/Throat: Oropharynx is clear and moist. No oropharyngeal exudate.  Eyes: Conjunctivae and EOM are normal. Pupils are equal, round, and reactive to light. No scleral icterus.  Neck: Normal range of motion. Neck supple. No JVD present.  Cardiovascular: Normal rate, regular rhythm and normal heart sounds.  Exam reveals no gallop and no friction rub.   No murmur heard. Pulmonary/Chest: Effort normal and breath sounds normal. No respiratory distress. He has no wheezes. He has no rales. He exhibits no tenderness.  Abdominal: He exhibits no distension and no mass. There is no tenderness. There is no rebound and no guarding.  Musculoskeletal: He exhibits no edema and no tenderness.  Lymphadenopathy:    He has no cervical adenopathy.  Neurological: He is alert and oriented to person, place, and time. He exhibits normal muscle tone. Coordination normal.  Skin: Skin is warm and dry. He is not diaphoretic. No erythema. No pallor.  Psychiatric: He has a normal mood and affect. His behavior is normal. Judgment and thought content normal.          Assessment & Plan:   Headaches: start atenolol for prophylaxis. Continue excedrin migraine prn. She needs be seen by neurology.  Note I cautioned the patient that if neurology were prescribing magnesium 4 her headaches that the magnesium need to be [facility] as magnesium 1 pair debility of TIVICAY to be active vs HIV integrase.  I spent greater  than 25 minutes with the patient including greater than 50% of time in face to face counsel of the patient  and in coordination of their care.  HIV: See her current regimen of Prezista Norvir Truvada and TIVICAY meds have been filled for 11 months and patient is to renew ADAP  Bring back for labs in approximately 4 months time, make sure that ADAP is renewed early in the summer i.e. July or so.  Hypertension I will swab by mouth chlorothiazide for atenolol for now.  Neuropathic pain in the hands still needs evaluation of cervical spine with MRI and neurology consult.

## 2013-08-03 ENCOUNTER — Other Ambulatory Visit: Payer: Self-pay | Admitting: *Deleted

## 2013-08-03 DIAGNOSIS — B20 Human immunodeficiency virus [HIV] disease: Secondary | ICD-10-CM

## 2013-08-03 MED ORDER — DOLUTEGRAVIR SODIUM 50 MG PO TABS: 50.0000 mg | ORAL_TABLET | Freq: Every day | ORAL | Status: DC

## 2013-08-03 MED ORDER — EMTRICITABINE-TENOFOVIR DF 200-300 MG PO TABS: 1.0000 | ORAL_TABLET | Freq: Every day | ORAL | Status: DC

## 2013-08-03 MED ORDER — RITONAVIR 100 MG PO TABS: 100.0000 mg | ORAL_TABLET | Freq: Every day | ORAL | Status: DC

## 2013-08-03 MED ORDER — DARUNAVIR ETHANOLATE 800 MG PO TABS: 800.0000 mg | ORAL_TABLET | Freq: Every day | ORAL | Status: DC

## 2013-09-02 ENCOUNTER — Emergency Department (HOSPITAL_COMMUNITY): Payer: Self-pay

## 2013-09-02 ENCOUNTER — Encounter (HOSPITAL_COMMUNITY): Payer: Self-pay | Admitting: Emergency Medicine

## 2013-09-02 ENCOUNTER — Emergency Department (HOSPITAL_COMMUNITY)
Admission: EM | Admit: 2013-09-02 | Discharge: 2013-09-02 | Disposition: A | Discharge status: Home / Self Care | Payer: Self-pay | Attending: Emergency Medicine | Admitting: Emergency Medicine

## 2013-09-02 DIAGNOSIS — S93422A Sprain of deltoid ligament of left ankle, initial encounter: Secondary | ICD-10-CM

## 2013-09-02 DIAGNOSIS — F17200 Nicotine dependence, unspecified, uncomplicated: Secondary | ICD-10-CM | POA: Insufficient documentation

## 2013-09-02 DIAGNOSIS — S82409A Unspecified fracture of shaft of unspecified fibula, initial encounter for closed fracture: Secondary | ICD-10-CM

## 2013-09-02 DIAGNOSIS — I10 Essential (primary) hypertension: Secondary | ICD-10-CM | POA: Insufficient documentation

## 2013-09-02 DIAGNOSIS — X500XXA Overexertion from strenuous movement or load, initial encounter: Secondary | ICD-10-CM | POA: Insufficient documentation

## 2013-09-02 DIAGNOSIS — Y939 Activity, unspecified: Secondary | ICD-10-CM | POA: Insufficient documentation

## 2013-09-02 DIAGNOSIS — Z79899 Other long term (current) drug therapy: Secondary | ICD-10-CM | POA: Insufficient documentation

## 2013-09-02 DIAGNOSIS — S9305XA Dislocation of left ankle joint, initial encounter: Secondary | ICD-10-CM

## 2013-09-02 DIAGNOSIS — W010XXA Fall on same level from slipping, tripping and stumbling without subsequent striking against object, initial encounter: Secondary | ICD-10-CM | POA: Insufficient documentation

## 2013-09-02 DIAGNOSIS — S82899A Other fracture of unspecified lower leg, initial encounter for closed fracture: Secondary | ICD-10-CM | POA: Insufficient documentation

## 2013-09-02 DIAGNOSIS — Y929 Unspecified place or not applicable: Secondary | ICD-10-CM | POA: Insufficient documentation

## 2013-09-02 DIAGNOSIS — Z21 Asymptomatic human immunodeficiency virus [HIV] infection status: Secondary | ICD-10-CM | POA: Insufficient documentation

## 2013-09-02 DIAGNOSIS — E785 Hyperlipidemia, unspecified: Secondary | ICD-10-CM | POA: Insufficient documentation

## 2013-09-02 DIAGNOSIS — S8263XA Displaced fracture of lateral malleolus of unspecified fibula, initial encounter for closed fracture: Secondary | ICD-10-CM

## 2013-09-02 DIAGNOSIS — Z8619 Personal history of other infectious and parasitic diseases: Secondary | ICD-10-CM | POA: Insufficient documentation

## 2013-09-02 HISTORY — DX: Displaced fracture of lateral malleolus of unspecified fibula, initial encounter for closed fracture: S82.63XA

## 2013-09-02 HISTORY — DX: Sprain of deltoid ligament of left ankle, initial encounter: S93.422A

## 2013-09-02 MED ORDER — OXYCODONE-ACETAMINOPHEN 5-325 MG PO TABS: 1.0000 | ORAL_TABLET | ORAL | Status: DC | PRN

## 2013-09-02 MED ORDER — HYDROMORPHONE HCL PF 1 MG/ML IJ SOLN
1.0000 mg | Freq: Once | INTRAMUSCULAR | Status: AC
  Administered 2013-09-02: 1 mg via INTRAVENOUS
  Filled 2013-09-02: qty 1

## 2013-09-02 MED ORDER — HYDROMORPHONE HCL PF 1 MG/ML IJ SOLN
1.0000 mg | Freq: Once | INTRAMUSCULAR | Status: AC
Start: 2013-09-02 — End: 2013-09-02
  Administered 2013-09-02: 1 mg via INTRAVENOUS
  Filled 2013-09-02: qty 1

## 2013-09-02 MED ORDER — OXYCODONE-ACETAMINOPHEN 5-325 MG PO TABS
2.0000 | ORAL_TABLET | Freq: Once | ORAL | Status: AC
  Administered 2013-09-02: 2 via ORAL
  Filled 2013-09-02: qty 2

## 2013-09-02 NOTE — Discharge Instructions (Signed)
Read the information below.  Use the prescribed medication as directed.  Please discuss all new medications with your pharmacist.  Do not take additional tylenol while taking the prescribed pain medication to avoid overdose.  You may return to the Emergency Department at any time for worsening condition or any new symptoms that concern you.  If you develop uncontrolled pain, weakness or numbness of the extremity, severe discoloration of the skin, or you are unable to move your toes, return to the ER for a recheck.     You will be seen by the orthopedist Dr Sherlean Foot on Monday afternoon at 2:00pm.  See his office information above.    Cast or Splint Care Casts and splints support injured limbs and keep bones from moving while they heal. It is important to care for your cast or splint at home.  HOME CARE INSTRUCTIONS  Keep the cast or splint uncovered during the drying period. It can take 24 to 48 hours to dry if it is made of plaster. A fiberglass cast will dry in less than 1 hour.  Do not rest the cast on anything harder than a pillow for the first 24 hours.  Do not put weight on your injured limb or apply pressure to the cast until your health care provider gives you permission.  Keep the cast or splint dry. Wet casts or splints can lose their shape and may not support the limb as well. A wet cast that has lost its shape can also create harmful pressure on your skin when it dries. Also, wet skin can become infected.  Cover the cast or splint with a plastic bag when bathing or when out in the rain or snow. If the cast is on the trunk of the body, take sponge baths until the cast is removed.  If your cast does become wet, dry it with a towel or a blow dryer on the cool setting only.  Keep your cast or splint clean. Soiled casts may be wiped with a moistened cloth.  Do not place any hard or soft foreign objects under your cast or splint, such as cotton, toilet paper, lotion, or powder.  Do not  try to scratch the skin under the cast with any object. The object could get stuck inside the cast. Also, scratching could lead to an infection. If itching is a problem, use a blow dryer on a cool setting to relieve discomfort.  Do not trim or cut your cast or remove padding from inside of it.  Exercise all joints next to the injury that are not immobilized by the cast or splint. For example, if you have a long leg cast, exercise the hip joint and toes. If you have an arm cast or splint, exercise the shoulder, elbow, thumb, and fingers.  Elevate your injured arm or leg on 1 or 2 pillows for the first 1 to 3 days to decrease swelling and pain.It is best if you can comfortably elevate your cast so it is higher than your heart. SEEK MEDICAL CARE IF:   Your cast or splint cracks.  Your cast or splint is too tight or too loose.  You have unbearable itching inside the cast.  Your cast becomes wet or develops a soft spot or area.  You have a bad smell coming from inside your cast.  You get an object stuck under your cast.  Your skin around the cast becomes red or raw.  You have new pain or worsening pain after  the cast has been applied. SEEK IMMEDIATE MEDICAL CARE IF:   You have fluid leaking through the cast.  You are unable to move your fingers or toes.  You have discolored (blue or white), cool, painful, or very swollen fingers or toes beyond the cast.  You have tingling or numbness around the injured area.  You have severe pain or pressure under the cast.  You have any difficulty with your breathing or have shortness of breath.  You have chest pain. Document Released: 06/26/2000 Document Revised: 04/19/2013 Document Reviewed: 01/05/2013 [facility] Patient Information 2014 [place], [place].

## 2013-09-02 NOTE — ED Notes (Signed)
Pt reports he slipped on the ice and fell twisting his L ankle, pt reports pain from his L calf down to his toes.  Pt has leg twisted outward stating that is how it has been since he fell and he is unable to move it. Pt reports he is unable to bear weight on his L leg. Pt a &o x4.

## 2013-09-02 NOTE — ED Provider Notes (Signed)
Medical screening examination/treatment/procedure(s) were conducted as a shared visit with non-physician practitioner(s) and myself.  I personally evaluated the patient during the encounter.  Slipped on the ice injuring her left ankle. Presents with severe sharp pain, swelling and deformity. X-ray reviewed as below. IV Dilaudid pain control. Patient consented for reduction/ splinting.   7:06 AM procedure: left ankle fracture subluxation/ reduction.  consent obtained. Traction applied. Audible clunk appreciated. Ortho tech assisted me with splint application - posterior and stirrup. Post reduction xray obtained. Plan Ortho follow up, pain control, NWB with splint and fracture instructions.  Results for orders placed in visit on 05/16/13  T-HELPER CELL (CD4)      Result Value Ref Range   CD4 T Cell Abs 250 (*) 400 - 2700 /uL   CD4 % Helper T Cell 17 (*) 33 - 55 %   Dg Ankle Complete Left  09/02/2013   CLINICAL DATA:  Status post fall; left ankle pain and swelling.  EXAM: LEFT ANKLE COMPLETE - 3+ VIEW  COMPARISON:  None.  FINDINGS: There is a displaced oblique fracture through the distal fibula, and a smaller fracture fragment arising from the posterior malleolus. There may be a fracture line at the edge of the medial malleolus, without evidence of displacement. The interosseous space appears mildly widened. There is posterior displacement of the talus with respect to the tibial plafond.  The joint spaces are otherwise preserved. Surrounding soft tissue swelling is noted. A small posterior calcaneal spur is seen.  IMPRESSION: Displaced oblique fracture through the distal fibula, and smaller fracture fragment arising from the posterior malleolus. Possible fracture line at the edge of the medial malleolus, without evidence of displacement. Apparent mild widening of the interosseous space, and posterior displacement of the talus with respect to the tibial plafond.   Electronically Signed   By: Roanna RaiderJeffery  Chang  M.D.   On: 09/02/2013 05:48      Sunnie NielsenBrian Lourdes Kucharski, MD 09/02/13 [phone]

## 2013-09-02 NOTE — ED Provider Notes (Signed)
6:05 AM Sign out from Ivonne AndrewPeter Dammen, PA-C, and Dr Dierdre Highmanpitz at change of shift.  Pt with fracture, subluxation of ankle.  Plan is for reduction and splinting, post reduction film and reassessment.  Possible consultation to orthopedics if alignment is not improved.  Anticipate d/c home.   6:59 AM Reduction performed by Dr Theodoro Kalataptiz.  Plan for post reduction film and ortho consultation, plan per their recommendations.   7:58 AM Dr Sherlean FootLucey has reviewed patient's films.  Will see patient in his office on Monday at 2:00pm.  [address].   Patient updated on plan and has been shown xrays.  Discussed result, findings, treatment, and follow up  with patient.  Pt given return precautions.  Pt verbalizes understanding and agrees with plan.      Results for orders placed in visit on 05/16/13  T-HELPER CELL (CD4)      Result Value Ref Range   CD4 T Cell Abs 250 (*) 400 - 2700 /uL   CD4 % Helper T Cell 17 (*) 33 - 55 %   Dg Ankle Complete Left  09/02/2013   CLINICAL DATA:  Left ankle fracture dislocation, postreduction  EXAM: LEFT ANKLE COMPLETE - 3+ VIEW  COMPARISON:  09/02/2013  FINDINGS: Improved alignment of the left ankle following reduction. Lateral and posterior malleolar fractures remain evident with minimal residual displacement. Talus and calcaneus intact.  IMPRESSION: Improved alignment of the left ankle.  Residual minimal displacement of the lateral and posterior malleolar fractures   Electronically Signed   By: Ruel Favorsrevor  Shick M.D.   On: 09/02/2013 07:20   Dg Ankle Complete Left  09/02/2013   CLINICAL DATA:  Status post fall; left ankle pain and swelling.  EXAM: LEFT ANKLE COMPLETE - 3+ VIEW  COMPARISON:  None.  FINDINGS: There is a displaced oblique fracture through the distal fibula, and a smaller fracture fragment arising from the posterior malleolus. There may be a fracture line at the edge of the medial malleolus, without evidence of displacement. The interosseous space appears mildly widened. There is  posterior displacement of the talus with respect to the tibial plafond.  The joint spaces are otherwise preserved. Surrounding soft tissue swelling is noted. A small posterior calcaneal spur is seen.  IMPRESSION: Displaced oblique fracture through the distal fibula, and smaller fracture fragment arising from the posterior malleolus. Possible fracture line at the edge of the medial malleolus, without evidence of displacement. Apparent mild widening of the interosseous space, and posterior displacement of the talus with respect to the tibial plafond.   Electronically Signed   By: Roanna RaiderJeffery  Chang M.D.   On: 09/02/2013 05:48      Trixie Dredgemily Hamsa Laurich, PA-C 09/02/13 1457

## 2013-09-02 NOTE — ED Notes (Signed)
Ortho tech called and informed.

## 2013-09-02 NOTE — ED Provider Notes (Signed)
CSN: [id number]     Arrival date & time 09/02/13  0454 History   First MD Initiated Contact with Patient 09/02/13 [phone]     Chief Complaint  Patient presents with  . Leg Injury   HPI  History provided by the patient. The patient is a 38 year old male with history of hypertension, hyperlipidemia and HIV who presents with left ankle pain and injury. Patient states he was returning home as before 5 AM when he slipped on the ice and causing his left ankle to twist. He states he was unable to stand or put weight on his ankle due to pain. He has had some swelling in the ankle since that time. He denies any other injury from the fall. There is no head injury or LOC. He did not use any medications or treatment for his pain or symptoms. Denies any associated weakness or numbness in the foot or toes. No other aggravating or alleviating factors. No other associated symptoms.    Past Medical History  Diagnosis Date  . Herpes genitalis in men   . HIV disease   . Hyperlipidemia   . Hypertension    History reviewed. No pertinent past surgical history. Family History  Problem Relation Age of Onset  . Hypertension Mother    History  Substance Use Topics  . Smoking status: Current Every Day Smoker -- 0.50 packs/day for 16 years    Types: Cigarettes  . Smokeless tobacco: Never Used  . Alcohol Use: No    Review of Systems  Neurological: Negative for weakness and numbness.  All other systems reviewed and are negative.      Allergies  Sustiva  Home Medications   Current Outpatient Rx  Name  Route  Sig  Dispense  Refill  . atenolol (TENORMIN) 50 MG tablet   Oral   Take 1 tablet (50 mg total) by mouth daily.   30 tablet   11   . Darunavir Ethanolate (PREZISTA) 800 MG tablet   Oral   Take 1 tablet (800 mg total) by mouth daily with breakfast.   30 tablet   11   . dolutegravir (TIVICAY) 50 MG tablet   Oral   Take 1 tablet (50 mg total) by mouth daily.   30 tablet   11   .  emtricitabine-tenofovir (TRUVADA) 200-300 MG per tablet   Oral   Take 1 tablet by mouth daily.   30 tablet   11   . fenofibrate (TRICOR) 145 MG tablet   Oral   Take 1 tablet (145 mg total) by mouth daily.   30 tablet   11   . ritonavir (NORVIR) 100 MG TABS tablet   Oral   Take 1 tablet (100 mg total) by mouth daily with breakfast.   30 tablet   5   . valACYclovir (VALTREX) 500 MG tablet   Oral   Take 1 tablet (500 mg total) by mouth daily.   30 tablet   11    BP 137/94  Pulse 115  Temp(Src) 98.9 F (37.2 C) (Oral)  Resp 20  SpO2 97% Physical Exam  Nursing note and vitals reviewed. Constitutional: He is oriented to person, place, and time. He appears well-developed and well-nourished.  HENT:  Head: Normocephalic.  Cardiovascular: Normal rate and regular rhythm.   Pulmonary/Chest: Effort normal and breath sounds normal. No respiratory distress. He has no wheezes.  Musculoskeletal:  Reduce range of motion of the left ankle secondary to pain. There is swelling to the anterior  portion of the ankle with tenderness. Swelling with slight deformity over lateral malleolus. No significant swelling or deformity over the medial malleoli area. No proximal fibular tenderness. Normal dorsal pedal pulses and sensation to light touch in the toes.  Neurological: He is alert and oriented to person, place, and time.  Skin: Skin is warm.  Psychiatric: He has a normal mood and affect. His behavior is normal.    ED Course  Procedures   DIAGNOSTIC STUDIES: Oxygen Saturation is 97% on RA.    COORDINATION OF CARE:  Nursing notes reviewed. Vital signs reviewed. Initial pt interview and examination performed.   5:19 AM-patient seen and evaluated. Patient appears in pain and discomfort. Discussed work up plan with pt at bedside, which includes x-rays of ankle. Pt agrees with plan.  X-rays reviewed.  Displaced fx of distal fibula.  There is some widening of joint space with posterior  displacement of talus.  X-rays and pt discussed with Attending Physician.  He will plan to relocate ankle followed by splint placement.  Pt also discussed in sign out with Trixie Dredge PA-C.  Treatment plan initiated: Medications  oxyCODONE-acetaminophen (PERCOCET/ROXICET) 5-325 MG per tablet 2 tablet (not administered)       Imaging Review Dg Ankle Complete Left  09/02/2013   CLINICAL DATA:  Status post fall; left ankle pain and swelling.  EXAM: LEFT ANKLE COMPLETE - 3+ VIEW  COMPARISON:  None.  FINDINGS: There is a displaced oblique fracture through the distal fibula, and a smaller fracture fragment arising from the posterior malleolus. There may be a fracture line at the edge of the medial malleolus, without evidence of displacement. The interosseous space appears mildly widened. There is posterior displacement of the talus with respect to the tibial plafond.  The joint spaces are otherwise preserved. Surrounding soft tissue swelling is noted. A small posterior calcaneal spur is seen.  IMPRESSION: Displaced oblique fracture through the distal fibula, and smaller fracture fragment arising from the posterior malleolus. Possible fracture line at the edge of the medial malleolus, without evidence of displacement. Apparent mild widening of the interosseous space, and posterior displacement of the talus with respect to the tibial plafond.   Electronically Signed   By: Roanna Raider M.D.   On: 09/02/2013 05:48     MDM   Final diagnoses:  Fracture of fibula, closed  Dislocation of ankle, left, closed       Angus Seller, PA-C 09/02/13 [phone]

## 2013-09-02 NOTE — Progress Notes (Signed)
Orthopedic Tech Progress Note Patient Details:  Jeremiah Stevens 04-28-1976 [id number]  Ortho Devices Type of Ortho Device: Short leg splint Ortho Device/Splint Location: L LE Ortho Device/Splint Interventions: Application   Jeremiah Stevens 09/02/2013, 7:03 AM

## 2013-09-02 NOTE — ED Notes (Signed)
Note:  He states he has a "new set of crutches" at home and wishes to use those.

## 2013-09-03 NOTE — ED Provider Notes (Signed)
Medical screening examination/treatment/procedure(s) were conducted as a shared visit with non-physician practitioner(s) and myself.  I personally evaluated the patient during the encounter.   Sunnie NielsenBrian Vineeth Fell, MD 09/03/13 [phone]

## 2013-09-04 ENCOUNTER — Other Ambulatory Visit: Payer: Self-pay | Admitting: Orthopedic Surgery

## 2013-09-05 ENCOUNTER — Encounter (HOSPITAL_BASED_OUTPATIENT_CLINIC_OR_DEPARTMENT_OTHER): Payer: Self-pay | Admitting: *Deleted

## 2013-09-05 NOTE — Pre-Procedure Instructions (Signed)
To come for BMET and EKG 

## 2013-09-07 ENCOUNTER — Other Ambulatory Visit: Payer: Self-pay | Admitting: Orthopedic Surgery

## 2013-09-07 ENCOUNTER — Ambulatory Visit (HOSPITAL_BASED_OUTPATIENT_CLINIC_OR_DEPARTMENT_OTHER): Admission: RE | Admit: 2013-09-07 | Payer: Self-pay | Source: Ambulatory Visit | Admitting: Orthopedic Surgery

## 2013-09-07 HISTORY — DX: Heartburn: R12

## 2013-09-07 HISTORY — DX: Sprain of deltoid ligament of left ankle, initial encounter: S93.422A

## 2013-09-07 HISTORY — DX: Migraine, unspecified, not intractable, without status migrainosus: G43.909

## 2013-09-07 HISTORY — DX: Displaced fracture of lateral malleolus of unspecified fibula, initial encounter for closed fracture: S82.63XA

## 2013-09-07 SURGERY — OPEN REDUCTION INTERNAL FIXATION (ORIF) ANKLE FRACTURE
Anesthesia: Choice | Site: Ankle

## 2013-09-07 MED ORDER — MIDAZOLAM HCL 2 MG/2ML IJ SOLN
INTRAMUSCULAR | Status: AC
  Filled 2013-09-07: qty 2

## 2013-09-07 MED ORDER — FENTANYL CITRATE 0.05 MG/ML IJ SOLN
INTRAMUSCULAR | Status: AC
  Filled 2013-09-07: qty 6

## 2013-09-07 NOTE — Anesthesia Preprocedure Evaluation (Addendum)
Anesthesia Evaluation  Patient identified by MRN, date of birth, ID band Patient awake    Reviewed: Allergy & Precautions, H&P , NPO status , Patient's Chart, lab work & pertinent test results  Airway Mallampati: II TM Distance: >3 FB Neck ROM: full    Dental no notable dental hx. (+) Teeth Intact, Dental Advisory Given   Pulmonary Current Smoker,  breath sounds clear to auscultation- rhonchi  Pulmonary exam normal       Cardiovascular hypertension, On Medications Rhythm:Regular Rate:Normal     Neuro/Psych  Headaches, negative psych ROS   GI/Hepatic negative GI ROS, Neg liver ROS,   Endo/Other  negative endocrine ROS  Renal/GU negative Renal ROS  negative genitourinary   Musculoskeletal   Abdominal   Peds  Hematology  (+) HIV,   Anesthesia Other Findings   Reproductive/Obstetrics negative OB ROS                          Anesthesia Physical Anesthesia Plan  ASA: III  Anesthesia Plan: General and Regional   Post-op Pain Management:    Induction: Intravenous  Airway Management Planned: LMA  Additional Equipment:   Intra-op Plan:   Post-operative Plan: Extubation in OR  Informed Consent: I have reviewed the patients History and Physical, chart, labs and discussed the procedure including the risks, benefits and alternatives for the proposed anesthesia with the patient or authorized representative who has indicated his/her understanding and acceptance.   Dental advisory given  Plan Discussed with: CRNA and Anesthesiologist  Anesthesia Plan Comments:        Anesthesia Quick Evaluation

## 2013-09-13 ENCOUNTER — Ambulatory Visit (HOSPITAL_BASED_OUTPATIENT_CLINIC_OR_DEPARTMENT_OTHER): Payer: Self-pay | Admitting: Anesthesiology

## 2013-09-13 ENCOUNTER — Encounter (HOSPITAL_BASED_OUTPATIENT_CLINIC_OR_DEPARTMENT_OTHER)
Admission: RE | Disposition: A | Discharge status: Home / Self Care | Payer: Self-pay | Source: Ambulatory Visit | Attending: Orthopedic Surgery

## 2013-09-13 ENCOUNTER — Encounter (HOSPITAL_BASED_OUTPATIENT_CLINIC_OR_DEPARTMENT_OTHER): Payer: Self-pay | Admitting: *Deleted

## 2013-09-13 ENCOUNTER — Ambulatory Visit (HOSPITAL_BASED_OUTPATIENT_CLINIC_OR_DEPARTMENT_OTHER)
Admission: RE | Admit: 2013-09-13 | Discharge: 2013-09-13 | Disposition: A | Discharge status: Home / Self Care | Payer: Self-pay | Source: Ambulatory Visit | Attending: Orthopedic Surgery | Admitting: Orthopedic Surgery

## 2013-09-13 ENCOUNTER — Encounter (HOSPITAL_BASED_OUTPATIENT_CLINIC_OR_DEPARTMENT_OTHER): Payer: Self-pay | Admitting: Anesthesiology

## 2013-09-13 DIAGNOSIS — G43909 Migraine, unspecified, not intractable, without status migrainosus: Secondary | ICD-10-CM | POA: Insufficient documentation

## 2013-09-13 DIAGNOSIS — Z79899 Other long term (current) drug therapy: Secondary | ICD-10-CM | POA: Insufficient documentation

## 2013-09-13 DIAGNOSIS — S82892A Other fracture of left lower leg, initial encounter for closed fracture: Secondary | ICD-10-CM

## 2013-09-13 DIAGNOSIS — Z21 Asymptomatic human immunodeficiency virus [HIV] infection status: Secondary | ICD-10-CM | POA: Insufficient documentation

## 2013-09-13 DIAGNOSIS — I10 Essential (primary) hypertension: Secondary | ICD-10-CM | POA: Insufficient documentation

## 2013-09-13 DIAGNOSIS — E785 Hyperlipidemia, unspecified: Secondary | ICD-10-CM | POA: Insufficient documentation

## 2013-09-13 DIAGNOSIS — B009 Herpesviral infection, unspecified: Secondary | ICD-10-CM | POA: Insufficient documentation

## 2013-09-13 DIAGNOSIS — Z91018 Allergy to other foods: Secondary | ICD-10-CM | POA: Insufficient documentation

## 2013-09-13 DIAGNOSIS — S8263XA Displaced fracture of lateral malleolus of unspecified fibula, initial encounter for closed fracture: Secondary | ICD-10-CM | POA: Insufficient documentation

## 2013-09-13 DIAGNOSIS — W010XXA Fall on same level from slipping, tripping and stumbling without subsequent striking against object, initial encounter: Secondary | ICD-10-CM | POA: Insufficient documentation

## 2013-09-13 HISTORY — PX: ORIF ANKLE FRACTURE: SHX5408

## 2013-09-13 LAB — POCT I-STAT, CHEM 8
BUN: 12 mg/dL (ref 6–23)
CALCIUM ION: 1.19 mmol/L (ref 1.12–1.23)
Chloride: 103 mEq/L (ref 96–112)
Creatinine, Ser: 1.3 mg/dL (ref 0.50–1.35)
Glucose, Bld: 97 mg/dL (ref 70–99)
HCT: 48 % (ref 39.0–52.0)
Hemoglobin: 16.3 g/dL (ref 13.0–17.0)
Potassium: 3.9 mEq/L (ref 3.7–5.3)
SODIUM: 140 meq/L (ref 137–147)
TCO2: 24 mmol/L (ref 0–100)

## 2013-09-13 SURGERY — OPEN REDUCTION INTERNAL FIXATION (ORIF) ANKLE FRACTURE
Anesthesia: Regional | Site: Ankle | Laterality: Left

## 2013-09-13 MED ORDER — BUPIVACAINE HCL (PF) 0.5 % IJ SOLN
INTRAMUSCULAR | Status: DC | PRN
  Administered 2013-09-13: 15 mL

## 2013-09-13 MED ORDER — LACTATED RINGERS IV SOLN
INTRAVENOUS | Status: DC
Start: 2013-09-13 — End: 2013-09-13
  Administered 2013-09-13 (×3): via INTRAVENOUS

## 2013-09-13 MED ORDER — DEXAMETHASONE SODIUM PHOSPHATE 4 MG/ML IJ SOLN
INTRAMUSCULAR | Status: DC | PRN
  Administered 2013-09-13: 10 mg via INTRAVENOUS

## 2013-09-13 MED ORDER — SODIUM CHLORIDE 0.9 % IV SOLN: INTRAVENOUS | Status: DC

## 2013-09-13 MED ORDER — BUPIVACAINE-EPINEPHRINE PF 0.5-1:200000 % IJ SOLN
INTRAMUSCULAR | Status: DC | PRN
  Administered 2013-09-13: 30 mL via PERINEURAL

## 2013-09-13 MED ORDER — CHLORHEXIDINE GLUCONATE 4 % EX LIQD: 60.0000 mL | Freq: Once | CUTANEOUS | Status: DC

## 2013-09-13 MED ORDER — OXYCODONE HCL 5 MG/5ML PO SOLN: 5.0000 mg | Freq: Once | ORAL | Status: DC | PRN

## 2013-09-13 MED ORDER — FENTANYL CITRATE 0.05 MG/ML IJ SOLN
50.0000 ug | INTRAMUSCULAR | Status: DC | PRN
  Administered 2013-09-13: 100 ug via INTRAVENOUS

## 2013-09-13 MED ORDER — MIDAZOLAM HCL 2 MG/2ML IJ SOLN
INTRAMUSCULAR | Status: AC
  Filled 2013-09-13: qty 2

## 2013-09-13 MED ORDER — HYDROMORPHONE HCL PF 1 MG/ML IJ SOLN: 0.2500 mg | INTRAMUSCULAR | Status: DC | PRN

## 2013-09-13 MED ORDER — PROPOFOL 10 MG/ML IV BOLUS
INTRAVENOUS | Status: DC | PRN
  Administered 2013-09-13: 200 mg via INTRAVENOUS

## 2013-09-13 MED ORDER — BUPIVACAINE HCL (PF) 0.5 % IJ SOLN
INTRAMUSCULAR | Status: AC
  Filled 2013-09-13: qty 30

## 2013-09-13 MED ORDER — LIDOCAINE HCL (CARDIAC) 20 MG/ML IV SOLN
INTRAVENOUS | Status: DC | PRN
  Administered 2013-09-13: 100 mg via INTRAVENOUS

## 2013-09-13 MED ORDER — MIDAZOLAM HCL 5 MG/5ML IJ SOLN
INTRAMUSCULAR | Status: DC | PRN
  Administered 2013-09-13: 2 mg via INTRAVENOUS

## 2013-09-13 MED ORDER — ONDANSETRON HCL 4 MG/2ML IJ SOLN
INTRAMUSCULAR | Status: DC | PRN
  Administered 2013-09-13: 4 mg via INTRAVENOUS

## 2013-09-13 MED ORDER — BUPIVACAINE-EPINEPHRINE PF 0.5-1:200000 % IJ SOLN
INTRAMUSCULAR | Status: AC
  Filled 2013-09-13: qty 30

## 2013-09-13 MED ORDER — MIDAZOLAM HCL 2 MG/2ML IJ SOLN
1.0000 mg | INTRAMUSCULAR | Status: DC | PRN
  Administered 2013-09-13: 2 mg via INTRAVENOUS

## 2013-09-13 MED ORDER — FENTANYL CITRATE 0.05 MG/ML IJ SOLN
INTRAMUSCULAR | Status: DC | PRN
  Administered 2013-09-13 (×3): 25 ug via INTRAVENOUS

## 2013-09-13 MED ORDER — CEFAZOLIN SODIUM-DEXTROSE 2-3 GM-% IV SOLR
INTRAVENOUS | Status: DC | PRN
  Administered 2013-09-13: 2 g via INTRAVENOUS

## 2013-09-13 MED ORDER — MIDAZOLAM HCL 2 MG/ML PO SYRP: 12.0000 mg | ORAL_SOLUTION | Freq: Once | ORAL | Status: DC | PRN

## 2013-09-13 MED ORDER — CEFAZOLIN SODIUM-DEXTROSE 2-3 GM-% IV SOLR
INTRAVENOUS | Status: AC
  Filled 2013-09-13: qty 50

## 2013-09-13 MED ORDER — FENTANYL CITRATE 0.05 MG/ML IJ SOLN
INTRAMUSCULAR | Status: AC
  Filled 2013-09-13: qty 4

## 2013-09-13 MED ORDER — OXYCODONE HCL 5 MG PO TABS: 5.0000 mg | ORAL_TABLET | Freq: Once | ORAL | Status: DC | PRN

## 2013-09-13 MED ORDER — FENTANYL CITRATE 0.05 MG/ML IJ SOLN
INTRAMUSCULAR | Status: AC
  Filled 2013-09-13: qty 2

## 2013-09-13 SURGICAL SUPPLY — 69 items
BAG DECANTER FOR FLEXI CONT (MISCELLANEOUS) IMPLANT
BANDAGE ELASTIC 6 VELCRO ST LF (GAUZE/BANDAGES/DRESSINGS) ×3 IMPLANT
BANDAGE ESMARK 6X9 LF (GAUZE/BANDAGES/DRESSINGS) IMPLANT
BIT DRILL 2.5 CANN LNG (BIT) ×2 IMPLANT
BIT DRILL 3.5 CANN STRL (BIT) ×2 IMPLANT
BLADE SURG 10 STRL SS (BLADE) IMPLANT
BLADE SURG 15 STRL LF DISP TIS (BLADE) ×1 IMPLANT
BLADE SURG 15 STRL SS (BLADE) ×3
BNDG CMPR 9X6 STRL LF SNTH (GAUZE/BANDAGES/DRESSINGS)
BNDG COHESIVE 4X5 TAN STRL (GAUZE/BANDAGES/DRESSINGS) ×3 IMPLANT
BNDG ESMARK 6X9 LF (GAUZE/BANDAGES/DRESSINGS)
CANISTER SUCT 1200ML W/VALVE (MISCELLANEOUS) ×3 IMPLANT
CLOSURE WOUND 1/2 X4 (GAUZE/BANDAGES/DRESSINGS)
COVER MAYO STAND STRL (DRAPES) ×3 IMPLANT
COVER TABLE BACK 60X90 (DRAPES) ×3 IMPLANT
DECANTER SPIKE VIAL GLASS SM (MISCELLANEOUS) IMPLANT
DRAPE EXTREMITY T 121X128X90 (DRAPE) ×3 IMPLANT
DRAPE OEC MINIVIEW 54X84 (DRAPES) ×3 IMPLANT
DRSG EMULSION OIL 3X3 NADH (GAUZE/BANDAGES/DRESSINGS) ×9 IMPLANT
DURAPREP 26ML APPLICATOR (WOUND CARE) ×3 IMPLANT
ELECT REM PT RETURN 9FT ADLT (ELECTROSURGICAL) ×3
ELECTRODE REM PT RTRN 9FT ADLT (ELECTROSURGICAL) ×1 IMPLANT
GLOVE BIO SURGEON STRL SZ7.5 (GLOVE) ×2 IMPLANT
GLOVE BIOGEL PI IND STRL 8.5 (GLOVE) ×2 IMPLANT
GLOVE BIOGEL PI INDICATOR 8.5 (GLOVE) ×6
GLOVE SURG ORTHO 8.0 STRL STRW (GLOVE) ×6 IMPLANT
GOWN STRL REUS W/ TWL LRG LVL3 (GOWN DISPOSABLE) ×1 IMPLANT
GOWN STRL REUS W/ TWL XL LVL3 (GOWN DISPOSABLE) ×1 IMPLANT
GOWN STRL REUS W/TWL LRG LVL3 (GOWN DISPOSABLE) ×3
GOWN STRL REUS W/TWL XL LVL3 (GOWN DISPOSABLE) ×3
NDL SUT 6 .5 CRC .975X.05 MAYO (NEEDLE) IMPLANT
NEEDLE HYPO 22GX1.5 SAFETY (NEEDLE) ×3 IMPLANT
NEEDLE MAYO TAPER (NEEDLE)
NS IRRIG 1000ML POUR BTL (IV SOLUTION) ×3 IMPLANT
PACK BASIN DAY SURGERY FS (CUSTOM PROCEDURE TRAY) ×3 IMPLANT
PAD ABD 8X10 STRL (GAUZE/BANDAGES/DRESSINGS) ×9 IMPLANT
PAD CAST 4YDX4 CTTN HI CHSV (CAST SUPPLIES) ×1 IMPLANT
PADDING CAST ABS 4INX4YD NS (CAST SUPPLIES) ×2
PADDING CAST ABS COTTON 4X4 ST (CAST SUPPLIES) ×1 IMPLANT
PADDING CAST COTTON 4X4 STRL (CAST SUPPLIES) ×3
PADDING CAST COTTON 6X4 STRL (CAST SUPPLIES) ×3 IMPLANT
PENCIL BUTTON HOLSTER BLD 10FT (ELECTRODE) ×3 IMPLANT
PLATE NL TUBLULAR 7H (Plate) ×2 IMPLANT
SCREW CANC T15 FT 16X4XST (Screw) IMPLANT
SCREW CANCELLOUS 4.0X16MM (Screw) ×3 IMPLANT
SCREW CANCELLOUS 4X14 (Screw) ×2 IMPLANT
SCREW LOW PROFILE 3.5X16 (Screw) ×4 IMPLANT
SCREW NLOCK T15 FT 18X3.5XST (Screw) IMPLANT
SCREW NON LOCK 3.5X18MM (Screw) ×3 IMPLANT
SCREW NON LOCKING 3.5X26 ANKLE (Screw) ×2 IMPLANT
SPLINT FAST PLASTER 5X30 (CAST SUPPLIES)
SPLINT PLASTER CAST FAST 5X30 (CAST SUPPLIES) IMPLANT
SPONGE GAUZE 4X4 12PLY (GAUZE/BANDAGES/DRESSINGS) ×3 IMPLANT
SPONGE LAP 4X18 X RAY DECT (DISPOSABLE) IMPLANT
STAPLER VISISTAT (STAPLE) ×2 IMPLANT
STOCKINETTE 6  STRL (DRAPES) ×2
STOCKINETTE 6 STRL (DRAPES) ×1 IMPLANT
STRIP CLOSURE SKIN 1/2X4 (GAUZE/BANDAGES/DRESSINGS) IMPLANT
SUCTION FRAZIER TIP 10 FR DISP (SUCTIONS) ×3 IMPLANT
SUT VIC AB 0 CT1 27 (SUTURE) ×3
SUT VIC AB 0 CT1 27XBRD ANBCTR (SUTURE) ×1 IMPLANT
SUT VIC AB 2-0 CT1 27 (SUTURE) ×3
SUT VIC AB 2-0 CT1 TAPERPNT 27 (SUTURE) ×1 IMPLANT
SYR BULB 3OZ (MISCELLANEOUS) ×3 IMPLANT
SYR CONTROL 10ML LL (SYRINGE) ×3 IMPLANT
TUBE CONNECTING 20'X1/4 (TUBING) ×1
TUBE CONNECTING 20X1/4 (TUBING) ×2 IMPLANT
UNDERPAD 30X30 INCONTINENT (UNDERPADS AND DIAPERS) ×3 IMPLANT
YANKAUER SUCT BULB TIP NO VENT (SUCTIONS) IMPLANT

## 2013-09-13 NOTE — Discharge Instructions (Signed)
Diet: As you were doing prior to hospitalization   Activity:  Increase activity slowly as tolerated                  No lifting or driving for 6 weeks  Shower:  May shower without a dressing once there is no drainage from your wound.                 Do NOT wash over the wound.     Weight Bearing:  Weight bearing as tolerated as taught in physical therapy.  Use a                                walker or Crutches as instructed.  To prevent constipation: you may use a stool softener such as -               Colace ( over the counter) 100 mg by mouth twice a day                Drink plenty of fluids ( prune juice may be helpful) and high fiber foods                Miralax ( over the counter) for constipation as needed.    Precautions:  If you experience chest pain or shortness of breath - call 911 immediately               For transfer to the hospital emergency department!!               If you develop a fever greater that 101 F, purulent drainage from wound,                             increased redness or drainage from wound, or calf pain -- Call the office.  Follow- Up Appointment:  Please call for an appointment to be seen on 09/18/13                                              [facility] office:  [phone]            [address] [place], [place] [address]                 Post Anesthesia Home Care Instructions  Activity: Get plenty of rest for the remainder of the day. A responsible adult should stay with you for 24 hours following the procedure.  For the next 24 hours, DO NOT: -Drive a car -Advertising copywriter -Drink alcoholic beverages -Take any medication unless instructed by your physician -Make any legal decisions or sign important papers.  Meals: Start with liquid foods such as gelatin or soup. Progress to regular foods as tolerated. Avoid greasy, spicy, heavy foods. If nausea and/or vomiting occur, drink only clear liquids until the nausea and/or vomiting subsides.  Call your physician if vomiting continues.  Special Instructions/Symptoms: Your throat may feel dry or sore from the anesthesia or the breathing tube placed in your throat during surgery. If this causes discomfort, gargle with warm salt water. The discomfort should disappear within 24 hours. Regional Anesthesia Blocks  1. Numbness or the inability to move the "blocked" extremity may last from 3-48 hours after placement. The length  of time depends on the medication injected and your individual response to the medication. If the numbness is not going away after 48 hours, call your surgeon.  2. The extremity that is blocked will need to be protected until the numbness is gone and the  Strength has returned. Because you cannot feel it, you will need to take extra care to avoid injury. Because it may be weak, you may have difficulty moving it or using it. You may not know what position it is in without looking at it while the block is in effect.  3. For blocks in the legs and feet, returning to weight bearing and walking needs to be done carefully. You will need to wait until the numbness is entirely gone and the strength has returned. You should be able to move your leg and foot normally before you try and bear weight or walk. You will need someone to be with you when you first try to ensure you do not fall and possibly risk injury.  4. Bruising and tenderness at the needle site are common side effects and will resolve in a few days.  5. Persistent numbness or new problems with movement should be communicated to the surgeon or the [facility] [phone])/ [facility] [phone]).

## 2013-09-13 NOTE — H&P (Signed)
NAME: Jeremiah Stevens MRN:   [id number] DOB:   1976/04/29     HISTORY AND PHYSICAL  CHIEF COMPLAINT:  Left ankle pain  HISTORY:   Jeremiah Stevens a 38 y.o. male  with left  Ankle Pain Patient complains of left ankle pain. Onset of the symptoms was several weeks ago. Inciting event: injured while walking. Current symptoms include: inability to bear weight, stiffness and swelling. Aggravating factors: direct pressure, going up and down stairs, standing, walking  and weight bearing. Symptoms have been well-controlled. Patient has had no prior ankle problems  PAST MEDICAL HISTORY:   Past Medical History  Diagnosis Date  . Herpes genitalis in men   . HIV disease   . Hyperlipidemia   . Hypertension     under control with med., has been on med. x 1 yr.  . Migraines   . Heartburn     occasional; OTC as needed  . Lateral malleolar fracture 09/02/2013    left  . Tear of deltoid ligament of left ankle 09/02/2013    PAST SURGICAL HISTORY:   Past Surgical History  Procedure Laterality Date  . No past surgeries      MEDICATIONS:   Medications Prior to Admission  Medication Sig Dispense Refill  . Darunavir Ethanolate (PREZISTA) 800 MG tablet Take 1 tablet (800 mg total) by mouth daily with breakfast.  30 tablet  11  . dolutegravir (TIVICAY) 50 MG tablet Take 1 tablet (50 mg total) by mouth daily.  30 tablet  11  . emtricitabine-tenofovir (TRUVADA) 200-300 MG per tablet Take 1 tablet by mouth daily.  30 tablet  11  . fenofibrate (TRICOR) 145 MG tablet Take 1 tablet (145 mg total) by mouth daily.  30 tablet  11  . hydrochlorothiazide (HYDRODIURIL) 25 MG tablet Take 25 mg by mouth daily.      Marland Kitchen. oxyCODONE-acetaminophen (PERCOCET/ROXICET) 5-325 MG per tablet Take 1-2 tablets by mouth every 4 (four) hours as needed for moderate pain or severe pain.  30 tablet  0  . ritonavir (NORVIR) 100 MG TABS tablet Take 1 tablet (100 mg total) by mouth daily with breakfast.  30 tablet  5  . valACYclovir  (VALTREX) 500 MG tablet Take 1 tablet (500 mg total) by mouth daily.  30 tablet  11    ALLERGIES:   Allergies  Allergen Reactions  . Peanut-Containing Drug Products Other (See Comments)    WALNUTS - SORES ON TONGUE  . Sustiva [Efavirenz] Rash    REVIEW OF SYSTEMS:   Negative except HPI  FAMILY HISTORY:   Family History  Problem Relation Age of Onset  . Hypertension Mother     SOCIAL HISTORY:   reports that he has been smoking Cigarettes.  He has a 7.5 pack-year smoking history. He has never used smokeless tobacco. He reports that he uses illicit drugs (Marijuana) about 7 times per week. He reports that he does not drink alcohol.  PHYSICAL EXAM:  General appearance: alert, cooperative and no distress Resp: clear to auscultation bilaterally Cardio: regular rate and rhythm, S1, S2 normal, no murmur, click, rub or gallop GI: soft, non-tender; bowel sounds normal; no masses,  no organomegaly Extremities: extremities normal, atraumatic, no cyanosis or edema and Homans sign is negative, no sign of DVT Pulses: 2+ and symmetric Skin: Skin color, texture, turgor normal. No rashes or lesions Neurologic: Alert and oriented X 3, normal strength and tone. Normal symmetric reflexes. Normal coordination and gait   Left swollen ankle in splint NV  intact   LABORATORY STUDIES: No results found for this basename: WBC, HGB, HCT, PLT,  in the last 72 hours  No results found for this basename: NA, K, CL, CO2, GLUCOSE, BUN, CREATININE, CALCIUM,  in the last 72 hours  STUDIES/RESULTS:  Dg Ankle Complete Left  09/02/2013   CLINICAL DATA:  Left ankle fracture dislocation, postreduction  EXAM: LEFT ANKLE COMPLETE - 3+ VIEW  COMPARISON:  09/02/2013  FINDINGS: Improved alignment of the left ankle following reduction. Lateral and posterior malleolar fractures remain evident with minimal residual displacement. Talus and calcaneus intact.  IMPRESSION: Improved alignment of the left ankle.  Residual minimal  displacement of the lateral and posterior malleolar fractures   Electronically Signed   By: Ruel Favors M.D.   On: 09/02/2013 07:20   Dg Ankle Complete Left  09/02/2013   CLINICAL DATA:  Status post fall; left ankle pain and swelling.  EXAM: LEFT ANKLE COMPLETE - 3+ VIEW  COMPARISON:  None.  FINDINGS: There is a displaced oblique fracture through the distal fibula, and a smaller fracture fragment arising from the posterior malleolus. There may be a fracture line at the edge of the medial malleolus, without evidence of displacement. The interosseous space appears mildly widened. There is posterior displacement of the talus with respect to the tibial plafond.  The joint spaces are otherwise preserved. Surrounding soft tissue swelling is noted. A small posterior calcaneal spur is seen.  IMPRESSION: Displaced oblique fracture through the distal fibula, and smaller fracture fragment arising from the posterior malleolus. Possible fracture line at the edge of the medial malleolus, without evidence of displacement. Apparent mild widening of the interosseous space, and posterior displacement of the talus with respect to the tibial plafond.   Electronically Signed   By: Roanna Raider M.D.   On: 09/02/2013 05:48    ASSESSMENT:  Left ankle fracture        Active Problems:   * No active hospital problems. *    PLAN: Left ankle ORIF, Possible deltoid ligament repair  Jeremiah Stevens 09/13/2013. 10:21 AM

## 2013-09-13 NOTE — Anesthesia Postprocedure Evaluation (Signed)
  Anesthesia Post-op Note  Patient: Jeremiah Stevens  Procedure(s) Performed: Procedure(s): OPEN REDUCTION INTERNAL FIXATION (ORIF) LEFT LATERAL MALLEOLUS ANKLE FRACTURE  (Left)  Patient Location: PACU  Anesthesia Type:General  Level of Consciousness: awake  Airway and Oxygen Therapy: Patient Spontanous Breathing  Post-op Pain: mild  Post-op Assessment: Post-op Vital signs reviewed  Post-op Vital Signs: Reviewed  Complications: No apparent anesthesia complications

## 2013-09-13 NOTE — Anesthesia Procedure Notes (Addendum)
Anesthesia Regional Block:  Popliteal block  Pre-Anesthetic Checklist: ,, timeout performed, Correct Patient, Correct Site, Correct Laterality, Correct Procedure, Correct Position, site marked, Risks and benefits discussed, pre-op evaluation, post-op pain management  Laterality: Left  Prep: Maximum Sterile Barrier Precautions used and chloraprep       Needles:  Injection technique: Single-shot  Needle Type: Echogenic Stimulator Needle     Needle Length: 9cm 9 cm Needle Gauge: 21 and 21 G    Additional Needles:  Procedures: ultrasound guided (picture in chart) and nerve stimulator Popliteal block  Nerve Stimulator or Paresthesia:  Response: Peroneal,  Response: Tibial,   Additional Responses:   Narrative:  Start time: 09/13/2013 11:06 AM End time: 09/13/2013 11:21 AM Injection made incrementally with aspirations every 5 mL. Anesthesiologist: Sampson GoonFitzgerald, MD  Additional Notes: 2% Lidocaine skin wheel. Saphenous block with 10cc of 0.5% Bupivicaine plain.   Procedure Name: LMA Insertion Date/Time: 09/13/2013 1:00 PM Performed by: Gar GibbonKEETON, Safaa Stingley S Pre-anesthesia Checklist: Patient identified, Emergency Drugs available, Suction available and Patient being monitored Patient Re-evaluated:Patient Re-evaluated prior to inductionOxygen Delivery Method: Circle System Utilized Preoxygenation: Pre-oxygenation with 100% oxygen Intubation Type: IV induction Ventilation: Mask ventilation without difficulty LMA: LMA inserted LMA Size: 5.0 Number of attempts: 1 Airway Equipment and Method: bite block Placement Confirmation: positive ETCO2 Tube secured with: Tape Dental Injury: Teeth and Oropharynx as per pre-operative assessment

## 2013-09-13 NOTE — Transfer of Care (Signed)
Immediate Anesthesia Transfer of Care Note  Patient: Jeremiah Stevens  Procedure(s) Performed: Procedure(s): OPEN REDUCTION INTERNAL FIXATION (ORIF) LEFT LATERAL MALLEOLUS ANKLE FRACTURE  (Left)  Patient Location: PACU  Anesthesia Type:GA combined with regional for post-op pain  Level of Consciousness: sedated  Airway & Oxygen Therapy: Patient Spontanous Breathing and Patient connected to face mask oxygen  Post-op Assessment: Report given to PACU RN and Post -op Vital signs reviewed and stable  Post vital signs: Reviewed and stable  Complications: No apparent anesthesia complications

## 2013-09-13 NOTE — Progress Notes (Signed)
Assisted Dr. Fitzgerald with left, ultrasound guided, popliteal/saphenous block. Side rails up, monitors on throughout procedure. See vital signs in flow sheet. Tolerated Procedure well. 

## 2013-09-13 NOTE — Op Note (Signed)
Dictation Number:  [id number]

## 2013-09-14 NOTE — Addendum Note (Signed)
Addendum created 09/14/13 1301 by Lance CoonWesley Marwa Fuhrman, CRNA   Modules edited: Charges VN

## 2013-09-14 NOTE — Op Note (Signed)
Jeremiah Stevens:  Jeremiah Stevens, Jeremiah Stevens                  ACCOUNT NO.:  [id number]  MEDICAL RECORD NO.:  [id number]  LOCATION:                                 FACILITY:  PHYSICIAN:  Mila HomerStephen D. Sherlean FootLucey, M.D. DATE OF BIRTH:  July 27, 1975  DATE OF PROCEDURE:  09/13/2013 DATE OF DISCHARGE:  09/13/2013                              OPERATIVE REPORT   SURGEON:  Mila HomerStephen D. Sherlean FootLucey, M.D.  ASSISTANT:  Altamese CabalMaurice Jones, PA-C.  ANESTHESIA:  General.  PREOPERATIVE DIAGNOSIS:  Left knee lateral malleolus fracture.  POSTOPERATIVE DIAGNOSIS:  Left knee lateral malleolus fracture.  PROCEDURE:  Lateral malleolar open reduction and internal fixation.  INDICATION FOR PROCEDURE:  The patient is a 38 year old status post slip and fall.  Known to the ER, being diagnosed with a complex fracture __________ posterior splint and consented for surgery.  DESCRIPTION OF PROCEDURE:  The patient was laid supine under general anesthesia.  Left leg prepped and draped in usual sterile fashion.  The extremity was exsanguinated with an Esmarch.  Tourniquet inflated to 350 mmHg.  A straight incision was made directly over the lateral malleolus. Sharp dissection was continued down to the subcuticular tissues.  The superficial peroneal nerve was immediately seen.  Actually __________ the fracture and appeared to be somewhat tented by the fracture.  There were some oozing __________ intact.  I then isolated and protected with a retractor and at this point, used the Arthrex system and put a 22 mm lag screw anterior to posterior after reducing the fracture with bone reduction forceps.  I then passed a 7-hole semi-tubular plate to the lateral cortex, placed 1 distal cancellous screw, 16 mm in length and 1 proximal bicortical screws 15 mm in length.  I then checked under AP and lateral C-arm imaging to ensure the fracture reduced anatomically and the mortise was reduced as well.  I then tested the syndesmosis and the deltoid ligament appeared to be  stable and did not feel __________ was necessary.  I then filled the proximal 2 bicortical holes and the distal cancellous hole and then re-x-rayed to ensure anatomic alignment and __________ and orientation.  I then lavaged, closed with buried 0 and 3- 0 Vicryl sutures, skin with staples.  Dressed with Xeroform, __________ sponges, sterile Webril, a posterior and stirrup splint, and an Ace wrap.  COMPLICATIONS:  None.  DRAINS:  None.  EBL:  Minimal.  TOURNIQUET TIME:  27 minutes.          ______________________________ Mila HomerStephen D. Sherlean FootLucey, M.D.     SDL/MEDQ  D:  09/13/2013  T:  09/14/2013  Job:  [id number]

## 2013-09-20 ENCOUNTER — Encounter (HOSPITAL_BASED_OUTPATIENT_CLINIC_OR_DEPARTMENT_OTHER): Payer: Self-pay | Admitting: Orthopedic Surgery

## 2013-11-16 ENCOUNTER — Other Ambulatory Visit: Payer: Self-pay

## 2013-11-22 ENCOUNTER — Other Ambulatory Visit: Payer: Self-pay

## 2013-11-30 ENCOUNTER — Ambulatory Visit (INDEPENDENT_AMBULATORY_CARE_PROVIDER_SITE_OTHER): Payer: Self-pay | Admitting: Internal Medicine

## 2013-11-30 ENCOUNTER — Encounter: Payer: Self-pay | Admitting: Internal Medicine

## 2013-11-30 VITALS — BP 133/93 | HR 101 | Temp 98.7°F | Wt 247.0 lb

## 2013-11-30 DIAGNOSIS — Z716 Tobacco abuse counseling: Secondary | ICD-10-CM

## 2013-11-30 DIAGNOSIS — Z7189 Other specified counseling: Secondary | ICD-10-CM

## 2013-11-30 DIAGNOSIS — Z79899 Other long term (current) drug therapy: Secondary | ICD-10-CM

## 2013-11-30 DIAGNOSIS — Z113 Encounter for screening for infections with a predominantly sexual mode of transmission: Secondary | ICD-10-CM

## 2013-11-30 DIAGNOSIS — E781 Pure hyperglyceridemia: Secondary | ICD-10-CM

## 2013-11-30 DIAGNOSIS — B20 Human immunodeficiency virus [HIV] disease: Secondary | ICD-10-CM

## 2013-11-30 DIAGNOSIS — K219 Gastro-esophageal reflux disease without esophagitis: Secondary | ICD-10-CM

## 2013-11-30 LAB — CBC WITH DIFFERENTIAL/PLATELET
BASOS PCT: 1 % (ref 0–1)
Basophils Absolute: 0 10*3/uL (ref 0.0–0.1)
EOS PCT: 3 % (ref 0–5)
Eosinophils Absolute: 0.1 10*3/uL (ref 0.0–0.7)
HCT: 44.6 % (ref 39.0–52.0)
Hemoglobin: 15.5 g/dL (ref 13.0–17.0)
Lymphocytes Relative: 26 % (ref 12–46)
Lymphs Abs: 1 10*3/uL (ref 0.7–4.0)
MCH: 32.1 pg (ref 26.0–34.0)
MCHC: 34.8 g/dL (ref 30.0–36.0)
MCV: 92.3 fL (ref 78.0–100.0)
MONO ABS: 0.5 10*3/uL (ref 0.1–1.0)
MONOS PCT: 13 % — AB (ref 3–12)
Neutro Abs: 2.2 10*3/uL (ref 1.7–7.7)
Neutrophils Relative %: 57 % (ref 43–77)
PLATELETS: 233 10*3/uL (ref 150–400)
RBC: 4.83 MIL/uL (ref 4.22–5.81)
RDW: 14.8 % (ref 11.5–15.5)
WBC: 3.9 10*3/uL — ABNORMAL LOW (ref 4.0–10.5)

## 2013-11-30 LAB — COMPLETE METABOLIC PANEL WITH GFR
ALT: 18 U/L (ref 0–53)
AST: 15 U/L (ref 0–37)
Albumin: 3.8 g/dL (ref 3.5–5.2)
Alkaline Phosphatase: 115 U/L (ref 39–117)
BUN: 11 mg/dL (ref 6–23)
CALCIUM: 9.1 mg/dL (ref 8.4–10.5)
CHLORIDE: 104 meq/L (ref 96–112)
CO2: 28 mEq/L (ref 19–32)
Creat: 1.37 mg/dL — ABNORMAL HIGH (ref 0.50–1.35)
GFR, EST NON AFRICAN AMERICAN: 65 mL/min
GFR, Est African American: 76 mL/min
GLUCOSE: 75 mg/dL (ref 70–99)
POTASSIUM: 3.8 meq/L (ref 3.5–5.3)
Sodium: 139 mEq/L (ref 135–145)
Total Bilirubin: 0.6 mg/dL (ref 0.2–1.2)
Total Protein: 7.4 g/dL (ref 6.0–8.3)

## 2013-11-30 LAB — LIPID PANEL
CHOL/HDL RATIO: 5.6 ratio
CHOLESTEROL: 192 mg/dL (ref 0–200)
HDL: 34 mg/dL — ABNORMAL LOW (ref >39)
LDL Cholesterol: 116 mg/dL — ABNORMAL HIGH (ref 0–99)
Triglycerides: 211 mg/dL — ABNORMAL HIGH (ref <150)
VLDL: 42 mg/dL — ABNORMAL HIGH (ref 0–40)

## 2013-11-30 LAB — RPR

## 2013-11-30 MED ORDER — OMEPRAZOLE 20 MG PO CPDR: 20.0000 mg | DELAYED_RELEASE_CAPSULE | Freq: Every day | ORAL | Status: DC

## 2013-11-30 NOTE — Progress Notes (Signed)
Subjective:    [name], male    DOB: 1976-05-28, 38 y.o.   MRN: [id number]  HPI Jeremiah Stevens is a 37yo M-> F with HIV, CD 4 count of 250/VL<20 (Nov 2014) on DLG/truvada/DRVr. Doing well with adherence. Saw [facility] in January for migraines where she was given atenolol for daily HA, now much improved. She sustained left ankle fracture early March due to fall on ice s/p ORIF and plate implant. Now just wearing a brace. 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 She notices intermittent GERD. No longer suffers from seasonal allergies  Current Outpatient Prescriptions on File Prior to Visit  Medication Sig Dispense Refill  . Darunavir Ethanolate (PREZISTA) 800 MG tablet Take 1 tablet (800 mg total) by mouth daily with breakfast.  30 tablet  11  . dolutegravir (TIVICAY) 50 MG tablet Take 1 tablet (50 mg total) by mouth daily.  30 tablet  11  . emtricitabine-tenofovir (TRUVADA) 200-300 MG per tablet Take 1 tablet by mouth daily.  30 tablet  11  . fenofibrate (TRICOR) 145 MG tablet Take 1 tablet (145 mg total) by mouth daily.  30 tablet  11  . hydrochlorothiazide (HYDRODIURIL) 25 MG tablet Take 25 mg by mouth daily.      Marland Kitchen. oxyCODONE-acetaminophen (PERCOCET/ROXICET) 5-325 MG per tablet Take 1-2 tablets by mouth every 4 (four) hours as needed for moderate pain or severe pain.  30 tablet  0  . ritonavir (NORVIR) 100 MG TABS tablet Take 1 tablet (100 mg total) by mouth daily with breakfast.  30 tablet  5  . valACYclovir (VALTREX) 500 MG tablet Take 1 tablet (500 mg total) by mouth daily.  30 tablet  11   No current facility-administered medications on file prior to visit.   Active Ambulatory Problems    Diagnosis Date Noted  . Human immunodeficiency virus (HIV) disease 10/27/2011  . HTN (hypertension) 10/27/2011  . Hypertriglyceridemia 10/27/2011  . Herpes 10/27/2011  . Migraine 08/02/2013  . 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 No Resolved Ambulatory Problems   Past Medical History  Diagnosis Date  . Herpes genitalis in men   . HIV disease   . Hyperlipidemia   . Hypertension   . Migraines   . Heartburn   . Lateral malleolar fracture 09/02/2013  . Tear of deltoid ligament of left ankle 09/02/2013    Soc hx: 1/2 ppd   Review of Systems 10 point ros reviewed, positive pertinents listed in hpi    Objective:   Physical Exam  BP 133/93  Pulse 101  Temp(Src) 98.7 F (37.1 C) (Oral)  Wt 247 lb (112.038 kg) Physical Exam  Constitutional: is oriented to person, place, and time.  appears well-developed and well-nourished. No distress.  HENT:  Mouth/Throat: Oropharynx is clear and moist. No oropharyngeal exudate.  Cardiovascular: Normal rate, regular rhythm and normal heart sounds. Exam reveals no gallop and no friction rub.  No murmur heard.  Pulmonary/Chest: Effort normal and breath sounds normal. No respiratory distress.  has no wheezes.  Abdominal: Soft. Bowel sounds are normal. He exhibits no distension. There is no tenderness.  Lymphadenopathy:  no cervical adenopathy.  Neurological:  is alert and oriented to person, place, and time.  Ext: left ankle brace Skin: Skin is warm and dry. No rash noted. No erythema.  Psychiatric: has a normal mood and affect.  behavior is normal.  Assessment & Plan:  hiv = well controlled, will repeat labs  Htg= will repeat cholesterol panel. Continue on tricor  Gerd= will prescribe omeprazole  Health maintenance =will check rpr  Smoking cessation = counseled for 3 min on smoking cessaiton/decrease from 10 to 8 cigs/day

## 2013-12-01 LAB — T-HELPER CELL (CD4) - (RCID CLINIC ONLY)
CD4 % Helper T Cell: 22 % — ABNORMAL LOW (ref 33–55)
CD4 T Cell Abs: 280 /uL — ABNORMAL LOW (ref 400–2700)

## 2013-12-01 LAB — HIV-1 RNA QUANT-NO REFLEX-BLD
HIV 1 RNA QUANT: 57 {copies}/mL — AB (ref <20)
HIV-1 RNA Quant, Log: 1.76 {Log} — ABNORMAL HIGH (ref <1.30)

## 2014-02-10 ENCOUNTER — Other Ambulatory Visit: Payer: Self-pay | Admitting: Internal Medicine

## 2014-02-12 ENCOUNTER — Other Ambulatory Visit: Payer: Self-pay | Admitting: *Deleted

## 2014-02-12 DIAGNOSIS — B20 Human immunodeficiency virus [HIV] disease: Secondary | ICD-10-CM

## 2014-02-12 MED ORDER — RITONAVIR 100 MG PO TABS
100.0000 mg | ORAL_TABLET | Freq: Every day | ORAL | Status: DC
Start: 2014-02-12 — End: 2014-05-09

## 2014-04-23 ENCOUNTER — Ambulatory Visit: Payer: Self-pay

## 2014-04-30 ENCOUNTER — Telehealth: Payer: Self-pay | Admitting: *Deleted

## 2014-04-30 NOTE — Telephone Encounter (Signed)
Patient called for advice.  He let ADAP expire, will run out of his blood pressure and herpes medication.  Patient will be in Wednesday to reapply for ADAP.  Pt transferred to Randolm IdolPam Jones for patient assistance advice. Andree CossHowell, Michelle M, RN

## 2014-05-02 ENCOUNTER — Ambulatory Visit: Payer: Self-pay

## 2014-05-09 ENCOUNTER — Other Ambulatory Visit: Payer: Self-pay | Admitting: Licensed Clinical Social Worker

## 2014-05-09 DIAGNOSIS — B20 Human immunodeficiency virus [HIV] disease: Secondary | ICD-10-CM

## 2014-05-09 MED ORDER — RITONAVIR 100 MG PO TABS: 100.0000 mg | ORAL_TABLET | Freq: Every day | ORAL | Status: DC

## 2014-05-09 MED ORDER — DOLUTEGRAVIR SODIUM 50 MG PO TABS: 50.0000 mg | ORAL_TABLET | Freq: Every day | ORAL | Status: DC

## 2014-05-09 MED ORDER — EMTRICITABINE-TENOFOVIR DF 200-300 MG PO TABS: 1.0000 | ORAL_TABLET | Freq: Every day | ORAL | Status: DC

## 2014-05-09 MED ORDER — DARUNAVIR ETHANOLATE 800 MG PO TABS: 800.0000 mg | ORAL_TABLET | Freq: Every day | ORAL | Status: DC

## 2014-05-22 ENCOUNTER — Ambulatory Visit: Payer: Self-pay | Admitting: Internal Medicine

## 2014-06-01 ENCOUNTER — Other Ambulatory Visit: Payer: Self-pay | Admitting: Internal Medicine

## 2014-06-04 ENCOUNTER — Other Ambulatory Visit: Payer: Self-pay | Admitting: *Deleted

## 2014-06-04 DIAGNOSIS — I10 Essential (primary) hypertension: Secondary | ICD-10-CM

## 2014-06-04 MED ORDER — HYDROCHLOROTHIAZIDE 25 MG PO TABS: 25.0000 mg | ORAL_TABLET | Freq: Every day | ORAL | Status: DC

## 2014-06-12 ENCOUNTER — Other Ambulatory Visit: Payer: Self-pay | Admitting: *Deleted

## 2014-06-12 DIAGNOSIS — B20 Human immunodeficiency virus [HIV] disease: Secondary | ICD-10-CM

## 2014-06-12 MED ORDER — DOLUTEGRAVIR SODIUM 50 MG PO TABS: 50.0000 mg | ORAL_TABLET | Freq: Every day | ORAL | Status: DC

## 2014-06-12 MED ORDER — EMTRICITABINE-TENOFOVIR DF 200-300 MG PO TABS
1.0000 | ORAL_TABLET | Freq: Every day | ORAL | Status: DC
Start: 2014-06-12 — End: 2014-09-21

## 2014-06-12 MED ORDER — DARUNAVIR ETHANOLATE 800 MG PO TABS: 800.0000 mg | ORAL_TABLET | Freq: Every day | ORAL | Status: DC

## 2014-07-28 ENCOUNTER — Other Ambulatory Visit: Payer: Self-pay | Admitting: Internal Medicine

## 2014-08-25 ENCOUNTER — Other Ambulatory Visit: Payer: Self-pay | Admitting: Infectious Disease

## 2014-08-27 ENCOUNTER — Other Ambulatory Visit: Payer: Self-pay | Admitting: *Deleted

## 2014-08-27 NOTE — Telephone Encounter (Signed)
Refills on file already.

## 2014-08-30 ENCOUNTER — Other Ambulatory Visit: Payer: Self-pay | Admitting: Infectious Disease

## 2014-08-30 DIAGNOSIS — B20 Human immunodeficiency virus [HIV] disease: Secondary | ICD-10-CM

## 2014-08-31 ENCOUNTER — Other Ambulatory Visit: Payer: Self-pay

## 2014-09-20 ENCOUNTER — Other Ambulatory Visit (INDEPENDENT_AMBULATORY_CARE_PROVIDER_SITE_OTHER): Payer: Self-pay

## 2014-09-20 ENCOUNTER — Ambulatory Visit: Payer: Self-pay

## 2014-09-20 DIAGNOSIS — B20 Human immunodeficiency virus [HIV] disease: Secondary | ICD-10-CM

## 2014-09-20 LAB — CBC WITH DIFFERENTIAL/PLATELET
BASOS ABS: 0 10*3/uL (ref 0.0–0.1)
BASOS PCT: 1 % (ref 0–1)
EOS ABS: 0.1 10*3/uL (ref 0.0–0.7)
EOS PCT: 2 % (ref 0–5)
HCT: 46 % (ref 39.0–52.0)
HEMOGLOBIN: 15.8 g/dL (ref 13.0–17.0)
LYMPHS PCT: 46 % (ref 12–46)
Lymphs Abs: 2 10*3/uL (ref 0.7–4.0)
MCH: 31.6 pg (ref 26.0–34.0)
MCHC: 34.3 g/dL (ref 30.0–36.0)
MCV: 92 fL (ref 78.0–100.0)
MONO ABS: 0.4 10*3/uL (ref 0.1–1.0)
MPV: 9.8 fL (ref 8.6–12.4)
Monocytes Relative: 10 % (ref 3–12)
Neutro Abs: 1.8 10*3/uL (ref 1.7–7.7)
Neutrophils Relative %: 41 % — ABNORMAL LOW (ref 43–77)
Platelets: 220 10*3/uL (ref 150–400)
RBC: 5 MIL/uL (ref 4.22–5.81)
RDW: 15.1 % (ref 11.5–15.5)
WBC: 4.4 10*3/uL (ref 4.0–10.5)

## 2014-09-20 LAB — COMPLETE METABOLIC PANEL WITH GFR
ALT: 11 U/L (ref 0–53)
AST: 12 U/L (ref 0–37)
Albumin: 4.4 g/dL (ref 3.5–5.2)
Alkaline Phosphatase: 85 U/L (ref 39–117)
BILIRUBIN TOTAL: 0.4 mg/dL (ref 0.2–1.2)
BUN: 12 mg/dL (ref 6–23)
CO2: 27 mEq/L (ref 19–32)
Calcium: 9 mg/dL (ref 8.4–10.5)
Chloride: 100 mEq/L (ref 96–112)
Creat: 1.2 mg/dL (ref 0.50–1.35)
GFR, EST NON AFRICAN AMERICAN: 76 mL/min
GFR, Est African American: 88 mL/min
Glucose, Bld: 70 mg/dL (ref 70–99)
POTASSIUM: 3.7 meq/L (ref 3.5–5.3)
Sodium: 140 mEq/L (ref 135–145)
Total Protein: 7.7 g/dL (ref 6.0–8.3)

## 2014-09-21 ENCOUNTER — Other Ambulatory Visit: Payer: Self-pay | Admitting: *Deleted

## 2014-09-21 DIAGNOSIS — B20 Human immunodeficiency virus [HIV] disease: Secondary | ICD-10-CM

## 2014-09-21 LAB — T-HELPER CELLS (CD4) COUNT (NOT AT ARMC)
CD4 % Helper T Cell: 20 % — ABNORMAL LOW (ref 33–55)
CD4 T Cell Abs: 440 /uL (ref 400–2700)

## 2014-09-21 MED ORDER — DOLUTEGRAVIR SODIUM 50 MG PO TABS: 50.0000 mg | ORAL_TABLET | Freq: Every day | ORAL | Status: DC

## 2014-09-21 MED ORDER — RITONAVIR 100 MG PO TABS: 100.0000 mg | ORAL_TABLET | Freq: Every day | ORAL | Status: DC

## 2014-09-21 MED ORDER — DARUNAVIR ETHANOLATE 800 MG PO TABS: 800.0000 mg | ORAL_TABLET | Freq: Every day | ORAL | Status: DC

## 2014-09-21 MED ORDER — EMTRICITABINE-TENOFOVIR DF 200-300 MG PO TABS: 1.0000 | ORAL_TABLET | Freq: Every day | ORAL | Status: DC

## 2014-09-23 LAB — HIV-1 RNA QUANT-NO REFLEX-BLD
HIV 1 RNA Quant: 53 copies/mL — ABNORMAL HIGH (ref <20)
HIV-1 RNA QUANT, LOG: 1.72 {Log} — AB (ref <1.30)

## 2014-10-02 ENCOUNTER — Ambulatory Visit: Payer: Self-pay | Admitting: Internal Medicine

## 2014-11-06 ENCOUNTER — Ambulatory Visit (INDEPENDENT_AMBULATORY_CARE_PROVIDER_SITE_OTHER): Payer: Self-pay | Admitting: Internal Medicine

## 2014-11-06 ENCOUNTER — Encounter: Payer: Self-pay | Admitting: Internal Medicine

## 2014-11-06 VITALS — BP 139/106 | HR 114 | Temp 97.2°F | Wt 213.0 lb

## 2014-11-06 DIAGNOSIS — B20 Human immunodeficiency virus [HIV] disease: Secondary | ICD-10-CM

## 2014-11-06 DIAGNOSIS — I10 Essential (primary) hypertension: Secondary | ICD-10-CM

## 2014-11-06 DIAGNOSIS — Z113 Encounter for screening for infections with a predominantly sexual mode of transmission: Secondary | ICD-10-CM

## 2014-11-06 DIAGNOSIS — E781 Pure hyperglyceridemia: Secondary | ICD-10-CM

## 2014-11-06 DIAGNOSIS — K219 Gastro-esophageal reflux disease without esophagitis: Secondary | ICD-10-CM

## 2014-11-06 DIAGNOSIS — Z21 Asymptomatic human immunodeficiency virus [HIV] infection status: Secondary | ICD-10-CM

## 2014-11-06 DIAGNOSIS — Z79899 Other long term (current) drug therapy: Secondary | ICD-10-CM

## 2014-11-06 MED ORDER — DOLUTEGRAVIR SODIUM 50 MG PO TABS: 50.0000 mg | ORAL_TABLET | Freq: Every day | ORAL | Status: DC

## 2014-11-06 MED ORDER — DARUNAVIR ETHANOLATE 800 MG PO TABS: 800.0000 mg | ORAL_TABLET | Freq: Every day | ORAL | Status: DC

## 2014-11-06 MED ORDER — RITONAVIR 100 MG PO TABS: 100.0000 mg | ORAL_TABLET | Freq: Every day | ORAL | Status: DC

## 2014-11-06 MED ORDER — FENOFIBRATE 145 MG PO TABS: 145.0000 mg | ORAL_TABLET | Freq: Every day | ORAL | Status: DC

## 2014-11-06 MED ORDER — VALACYCLOVIR HCL 500 MG PO TABS: 500.0000 mg | ORAL_TABLET | Freq: Two times a day (BID) | ORAL | Status: DC

## 2014-11-06 MED ORDER — HYDROCHLOROTHIAZIDE 25 MG PO TABS
25.0000 mg | ORAL_TABLET | Freq: Every day | ORAL | Status: DC
Start: 2014-11-06 — End: 2015-11-07

## 2014-11-06 MED ORDER — EMTRICITABINE-TENOFOVIR DF 200-300 MG PO TABS: 1.0000 | ORAL_TABLET | Freq: Every day | ORAL | Status: DC

## 2014-11-06 MED ORDER — OMEPRAZOLE 20 MG PO CPDR: 20.0000 mg | DELAYED_RELEASE_CAPSULE | Freq: Every day | ORAL | Status: DC

## 2014-11-06 NOTE — Progress Notes (Signed)
Subjective:    [name], male    DOB: 07/04/1976, 39 y.o.   MRN: [id number]  HPI Jeremiah Stevens is  39 y/o transgender patient who presents to clinic for HIV f/u with continued low level viremia of 53, CD4 count of 440. She continues to take her Prezista, Tivicay and Truvada every day.  It has been almost one years since last seen in clinic. She has had a lot of family stressors and is helping to care for many of her siblings. She crochet's every day and has been complaining of a burning and radiating pain from the right thumb up to her forearm. She has been wearing a brace and has taken an arthritis medicine that her grandmother had given her, she does not know the name of it. She states this gave her great relief. She is due for another lipid draw, has not been taking tricor r/t thinking she didn't need to take it anymore.  Lab Results  Component Value Date   CD4TABS 440 09/20/2014   CD4TABS 280* 11/30/2013   CD4TABS 250* 05/16/2013   Lab Results  Component Value Date   HIV1RNAQUANT 53* 09/20/2014    Outpatient Encounter Prescriptions as of 11/06/2014  Medication Sig  . Darunavir Ethanolate (PREZISTA) 800 MG tablet Take 1 tablet (800 mg total) by mouth daily with breakfast.  . dolutegravir (TIVICAY) 50 MG tablet Take 1 tablet (50 mg total) by mouth daily.  Marland Kitchen. emtricitabine-tenofovir (TRUVADA) 200-300 MG per tablet Take 1 tablet by mouth daily.  . fenofibrate (TRICOR) 145 MG tablet Take 1 tablet (145 mg total) by mouth daily.  . hydrochlorothiazide (HYDRODIURIL) 25 MG tablet Take 1 tablet (25 mg total) by mouth daily.  Marland Kitchen. omeprazole (PRILOSEC) 20 MG capsule Take 1 capsule (20 mg total) by mouth daily.  Marland Kitchen. oxyCODONE-acetaminophen (PERCOCET/ROXICET) 5-325 MG per tablet Take 1-2 tablets by mouth every 4 (four) hours as needed for moderate pain or severe pain.  . ritonavir (NORVIR) 100 MG TABS tablet Take 1 tablet (100 mg total) by mouth daily with breakfast.  . valACYclovir  (VALTREX) 500 MG tablet Take 1 tablet (500 mg total) by mouth 2 (two) times daily.  . [DISCONTINUED] Darunavir Ethanolate (PREZISTA) 800 MG tablet Take 1 tablet (800 mg total) by mouth daily with breakfast.  . [DISCONTINUED] Darunavir Ethanolate (PREZISTA) 800 MG tablet Take 1 tablet (800 mg total) by mouth daily with breakfast.  . [DISCONTINUED] dolutegravir (TIVICAY) 50 MG tablet Take 1 tablet (50 mg total) by mouth daily.  . [DISCONTINUED] dolutegravir (TIVICAY) 50 MG tablet Take 1 tablet (50 mg total) by mouth daily.  . [DISCONTINUED] emtricitabine-tenofovir (TRUVADA) 200-300 MG per tablet Take 1 tablet by mouth daily.  . [DISCONTINUED] emtricitabine-tenofovir (TRUVADA) 200-300 MG per tablet Take 1 tablet by mouth daily.  . [DISCONTINUED] fenofibrate (TRICOR) 145 MG tablet Take 1 tablet (145 mg total) by mouth daily.  . [DISCONTINUED] hydrochlorothiazide (HYDRODIURIL) 25 MG tablet Take 1 tablet (25 mg total) by mouth daily.  . [DISCONTINUED] omeprazole (PRILOSEC) 20 MG capsule Take 1 capsule (20 mg total) by mouth daily.  . [DISCONTINUED] ritonavir (NORVIR) 100 MG TABS tablet Take 1 tablet (100 mg total) by mouth daily with breakfast.  . [DISCONTINUED] ritonavir (NORVIR) 100 MG TABS tablet Take 1 tablet (100 mg total) by mouth daily with breakfast.  . [DISCONTINUED] valACYclovir (VALTREX) 500 MG tablet TAKE 1 TABLET BY MOUTH TWICE DAILY      Review of Systems  Constitutional: Negative for fever, appetite change,  fatigue and unexpected weight change.  HENT: Negative for dental problem, ear discharge, ear pain, mouth sores and sore throat.   Eyes: Negative for photophobia and visual disturbance.  Respiratory: Negative for cough.   Cardiovascular: Negative for chest pain.  Gastrointestinal: Negative for nausea, abdominal pain, diarrhea, constipation and abdominal distention.  Genitourinary: Negative for difficulty urinating.  Musculoskeletal:       Burning radiating pain from right thumb  to forearm, worsens with crocheting  Neurological: Negative for dizziness, weakness and numbness.  Hematological: Negative for adenopathy.  Psychiatric/Behavioral: Negative for agitation. The patient is hyperactive.        Objective:   Physical Exam  Constitutional: He is oriented to person, place, and time. He appears well-developed and well-nourished.  HENT:  Head: Normocephalic and atraumatic.  Mouth/Throat: No oropharyngeal exudate.  Eyes: Pupils are equal, round, and reactive to light.  Neck: Normal range of motion. Neck supple.  Cardiovascular: Normal rate and regular rhythm.   Pulmonary/Chest: Effort normal and breath sounds normal. No respiratory distress.  Abdominal: Soft. Bowel sounds are normal.  Musculoskeletal: Normal range of motion.  Pain with ROM to right wrist   Lymphadenopathy:    He has no cervical adenopathy.  Neurological: He is alert and oriented to person, place, and time.  Skin: Skin is warm and dry.  Psychiatric:  Hyperactive  BP 139/106 mmHg  Pulse 114  Temp(Src) 97.2 F (36.2 C) (Oral)  Wt 213 lb (96.616 kg)   Labs: Lab Results  Component Value Date   CD4TCELL 20* 09/20/2014   CD4TABS 440 09/20/2014   Lab Results  Component Value Date   HIV1RNAQUANT 53* 09/20/2014   BMET    Component Value Date/Time   NA 140 09/20/2014 1431   K 3.7 09/20/2014 1431   CL 100 09/20/2014 1431   CO2 27 09/20/2014 1431   GLUCOSE 70 09/20/2014 1431   BUN 12 09/20/2014 1431   CREATININE 1.20 09/20/2014 1431   CREATININE 1.30 09/13/2013 1059   CALCIUM 9.0 09/20/2014 1431   GFRNONAA 76 09/20/2014 1431   GFRAA 88 09/20/2014 1431           Assessment & Plan:   HIV:  Continue current regimen Check U/A, lipids, RPR today RTC in 2 months  Hypertriglyceridemia: Restart Tricor  Wrist pain:possibly tendonitis vs. Carpal tunnel, distribution not c/w carpal tunnel, though Continue to wear brace Rest wrist, apply ice NSAIDS PRN  I have seen the  patient with Jeremiah Stevens and examine the patient and discussed plan of care. Agree with the plan noted above.  Duke Salvia Drue Second MD MPH [facility] [phone]

## 2014-11-07 LAB — LIPID PANEL
CHOL/HDL RATIO: 4.5 ratio
Cholesterol: 186 mg/dL (ref 0–200)
HDL: 41 mg/dL (ref >=40)
LDL CALC: 120 mg/dL — AB (ref 0–99)
Triglycerides: 124 mg/dL (ref <150)
VLDL: 25 mg/dL (ref 0–40)

## 2014-11-07 LAB — RPR

## 2014-11-10 ENCOUNTER — Emergency Department (HOSPITAL_COMMUNITY)
Admission: EM | Admit: 2014-11-10 | Discharge: 2014-11-10 | Disposition: A | Discharge status: Home / Self Care | Payer: Self-pay | Attending: Emergency Medicine | Admitting: Emergency Medicine

## 2014-11-10 ENCOUNTER — Encounter (HOSPITAL_COMMUNITY): Payer: Self-pay | Admitting: *Deleted

## 2014-11-10 DIAGNOSIS — Z8639 Personal history of other endocrine, nutritional and metabolic disease: Secondary | ICD-10-CM | POA: Insufficient documentation

## 2014-11-10 DIAGNOSIS — Z23 Encounter for immunization: Secondary | ICD-10-CM | POA: Insufficient documentation

## 2014-11-10 DIAGNOSIS — Z8619 Personal history of other infectious and parasitic diseases: Secondary | ICD-10-CM | POA: Insufficient documentation

## 2014-11-10 DIAGNOSIS — H18823 Corneal disorder due to contact lens, bilateral: Secondary | ICD-10-CM | POA: Insufficient documentation

## 2014-11-10 DIAGNOSIS — Z720 Tobacco use: Secondary | ICD-10-CM | POA: Insufficient documentation

## 2014-11-10 DIAGNOSIS — R000 Tachycardia, unspecified: Secondary | ICD-10-CM | POA: Insufficient documentation

## 2014-11-10 DIAGNOSIS — Z8781 Personal history of (healed) traumatic fracture: Secondary | ICD-10-CM | POA: Insufficient documentation

## 2014-11-10 DIAGNOSIS — I10 Essential (primary) hypertension: Secondary | ICD-10-CM | POA: Insufficient documentation

## 2014-11-10 DIAGNOSIS — Z79899 Other long term (current) drug therapy: Secondary | ICD-10-CM | POA: Insufficient documentation

## 2014-11-10 DIAGNOSIS — Z21 Asymptomatic human immunodeficiency virus [HIV] infection status: Secondary | ICD-10-CM | POA: Insufficient documentation

## 2014-11-10 MED ORDER — TETANUS-DIPHTH-ACELL PERTUSSIS 5-2.5-18.5 LF-MCG/0.5 IM SUSP
0.5000 mL | Freq: Once | INTRAMUSCULAR | Status: AC
  Administered 2014-11-10: 0.5 mL via INTRAMUSCULAR
  Filled 2014-11-10: qty 0.5

## 2014-11-10 MED ORDER — FLUORESCEIN SODIUM 1 MG OP STRP
1.0000 | ORAL_STRIP | Freq: Once | OPHTHALMIC | Status: AC
  Administered 2014-11-10: 1 via OPHTHALMIC
  Filled 2014-11-10 (×2): qty 1

## 2014-11-10 MED ORDER — KETOROLAC TROMETHAMINE 0.5 % OP SOLN: 1.0000 [drp] | Freq: Four times a day (QID) | OPHTHALMIC | Status: DC

## 2014-11-10 MED ORDER — TETRACAINE HCL 0.5 % OP SOLN
1.0000 [drp] | Freq: Once | OPHTHALMIC | Status: AC
  Administered 2014-11-10: 1 [drp] via OPHTHALMIC
  Filled 2014-11-10: qty 2

## 2014-11-10 MED ORDER — CIPROFLOXACIN HCL 0.3 % OP SOLN: 1.0000 [drp] | OPHTHALMIC | Status: DC

## 2014-11-10 NOTE — ED Notes (Signed)
Pt states that she slept in her contacts last night. Eyes noted to be red. Pt reports flushing eyes with water. Pt reports blurry vision.

## 2014-11-10 NOTE — ED Notes (Signed)
attempted to give a visual acuity exam but visitor said she was resting and to try later. PA notified

## 2014-11-10 NOTE — ED Provider Notes (Signed)
CSN: [id number]     Arrival date & time 11/10/14  1755 History  This chart was scribed for non-physician practitioner Dierdre Forth PA-C working with Arby Barrette, MD by Conchita Paris, ED Scribe. This patient was seen in TR04C/TR04C and the patient's care was started at 6:18 PM.   Chief Complaint  Patient presents with  . Eye Pain   Patient is a 39 y.o. male presenting with eye pain. The history is provided by the patient and medical records. No language interpreter was used.  Eye Pain Pertinent negatives include no chest pain, no abdominal pain, no headaches and no shortness of breath.    HPI Comments: Jeremiah Stevens is a 39 y.o. male with a history of hyperlipidemia, hypertension and HIV who presents to the Emergency Department complaining of bilateral eye pain with associated redness, acute onset last night. Pt slept with her colored eye contacts in last night. The contacts were removed upon awaking this morning. She denies fever, chills, headache, neck pain, neck stiffness.  Pt does not need vision correction.  Last tetanus was > 5 years ago.    Past Medical History  Diagnosis Date  . Herpes genitalis in men   . HIV disease   . Hyperlipidemia   . Hypertension     under control with med., has been on med. x 1 yr.  . Migraines   . Heartburn     occasional; OTC as needed  . Lateral malleolar fracture 09/02/2013    left  . Tear of deltoid ligament of left ankle 09/02/2013   Past Surgical History  Procedure Laterality Date  . No past surgeries    . Orif ankle fracture Left 09/13/2013    Procedure: OPEN REDUCTION INTERNAL FIXATION (ORIF) LEFT LATERAL MALLEOLUS ANKLE FRACTURE ;  Surgeon: Dannielle Huh, MD;  Location: [facility];  Service: Orthopedics;  Laterality: Left;   Family History  Problem Relation Age of Onset  . Hypertension Mother    History  Substance Use Topics  . Smoking status: Current Every Day Smoker -- 0.50 packs/day for 15 years    Types:  Cigarettes  . Smokeless tobacco: Never Used  . Alcohol Use: No   Review of Systems  Constitutional: Negative for fever, diaphoresis, appetite change, fatigue and unexpected weight change.  HENT: Negative for mouth sores.   Eyes: Positive for photophobia, pain, redness and visual disturbance.  Respiratory: Negative for cough, chest tightness, shortness of breath and wheezing.   Cardiovascular: Negative for chest pain.  Gastrointestinal: Negative for nausea, vomiting, abdominal pain, diarrhea and constipation.  Endocrine: Negative for polydipsia, polyphagia and polyuria.  Genitourinary: Negative for dysuria, urgency, frequency and hematuria.  Musculoskeletal: Negative for back pain and neck stiffness.  Skin: Negative for rash.  Allergic/Immunologic: Negative for immunocompromised state.  Neurological: Negative for syncope, light-headedness and headaches.  Hematological: Does not bruise/bleed easily.  Psychiatric/Behavioral: Negative for sleep disturbance. The patient is not nervous/anxious.    Allergies  Peanut-containing drug products and Sustiva  Home Medications   Prior to Admission medications   Medication Sig Start Date End Date Taking? Authorizing Provider  ciprofloxacin (CILOXAN) 0.3 % ophthalmic solution Place 1 drop into both eyes every 2 (two) hours. Administer 1 drop, every 2 hours, while awake, for 2 days. Then 1 drop, every 4 hours, while awake, for the next 5 days. 11/10/14   Shell Yandow, PA-C  Darunavir Ethanolate (PREZISTA) 800 MG tablet Take 1 tablet (800 mg total) by mouth daily with breakfast. 11/06/14  Judyann Munsonynthia Snider, MD  dolutegravir (TIVICAY) 50 MG tablet Take 1 tablet (50 mg total) by mouth daily. 11/06/14   Judyann Munsonynthia Snider, MD  emtricitabine-tenofovir (TRUVADA) 200-300 MG per tablet Take 1 tablet by mouth daily. 11/06/14 11/06/15  Judyann Munsonynthia Snider, MD  fenofibrate (TRICOR) 145 MG tablet Take 1 tablet (145 mg total) by mouth daily. 11/06/14   Judyann Munsonynthia Snider, MD   hydrochlorothiazide (HYDRODIURIL) 25 MG tablet Take 1 tablet (25 mg total) by mouth daily. 11/06/14   Judyann Munsonynthia Snider, MD  ketorolac (ACULAR) 0.5 % ophthalmic solution Place 1 drop into both eyes every 6 (six) hours. 11/10/14   Dain Laseter, PA-C  omeprazole (PRILOSEC) 20 MG capsule Take 1 capsule (20 mg total) by mouth daily. 11/06/14   Judyann Munsonynthia Snider, MD  oxyCODONE-acetaminophen (PERCOCET/ROXICET) 5-325 MG per tablet Take 1-2 tablets by mouth every 4 (four) hours as needed for moderate pain or severe pain. 09/02/13   Trixie DredgeEmily West, PA-C  ritonavir (NORVIR) 100 MG TABS tablet Take 1 tablet (100 mg total) by mouth daily with breakfast. 11/06/14   Judyann Munsonynthia Snider, MD  valACYclovir (VALTREX) 500 MG tablet Take 1 tablet (500 mg total) by mouth 2 (two) times daily. 11/06/14   Judyann Munsonynthia Snider, MD   BP 112/99 mmHg  Pulse 111  Temp(Src) 98 F (36.7 C) (Oral)  Resp 22  SpO2 98% Physical Exam  Constitutional: He is oriented to person, place, and time. He appears well-developed and well-nourished. No distress.  HENT:  Head: Normocephalic and atraumatic.  Nose: Nose normal. No mucosal edema or rhinorrhea.  Mouth/Throat: Uvula is midline, oropharynx is clear and moist and mucous membranes are normal. No uvula swelling. No oropharyngeal exudate, posterior oropharyngeal edema, posterior oropharyngeal erythema or tonsillar abscesses.  Eyes: EOM and lids are normal. Pupils are equal, round, and reactive to light. Lids are everted and swept, no foreign bodies found. Right eye exhibits no chemosis, no discharge and no exudate. No foreign body present in the right eye. Left eye exhibits no chemosis, no discharge and no exudate. No foreign body present in the left eye. Right conjunctiva is injected. Right conjunctiva has no hemorrhage. Left conjunctiva is injected. Left conjunctiva has no hemorrhage.  Slit lamp exam:      The right eye shows no corneal abrasion, no corneal flare, no corneal ulcer, no foreign body, no  fluorescein uptake and no anterior chamber bulge.       The left eye shows no corneal abrasion, no corneal flare, no corneal ulcer, no foreign body, no fluorescein uptake and no anterior chamber bulge.  Pupils equal round and reactive to light No vertical, horizontal or rotational nystagmus Large corneal abrasion noted to the central region overlying the entire iris of both eyee with fluorescein uptake No visible foreign body No corneal flare, ulcer or dendritic staining  No herpetic lesions to the face or around the eye  Visual Acuity: pt refused to cooperate with exam  Neck: Normal range of motion.  Full range of motion without pain No midline or paraspinal tenderness No nuchal rigidity; no meningeal signs  Cardiovascular: Regular rhythm, normal heart sounds and intact distal pulses.  Tachycardia present.   Pulses:      Radial pulses are 2+ on the right side, and 2+ on the left side.  Pulmonary/Chest: Effort normal and breath sounds normal. No respiratory distress.  Musculoskeletal: Normal range of motion.  Neurological: He is alert and oriented to person, place, and time.  Mental Status:  Alert, oriented, thought content appropriate. Speech fluent without  evidence of aphasia. Able to follow 2 step commands without difficulty.   Cranial Nerves:  II:  Peripheral visual fields grossly normal, pupils equal, round, reactive to light III,IV, VI: ptosis not present, extra-ocular motions intact bilaterally  V,VII: smile symmetric, facial light touch sensation equal VIII: hearing grossly normal bilaterally  IX,X: gag reflex present  XI: bilateral shoulder shrug equal and strong XII: midline tongue extension   Skin: Skin is warm and dry. He is not diaphoretic. No erythema.  Psychiatric: His mood appears anxious. He is agitated.  Pt agitated and screaming  Nursing note and vitals reviewed.   ED Course  Procedures  DIAGNOSTIC STUDIES: Oxygen Saturation is 98% on room, normal by my  interpretation.    COORDINATION OF CARE: 6:24 PM Discussed treatment plan with pt at bedside and pt agreed to plan.  Labs Review Labs Reviewed - No data to display  Imaging Review No results found.   EKG Interpretation None      MDM   Final diagnoses:  Corneal abrasion due to contact lens, bilateral   ARAV BANNISTER presents with bilateral eye pain after sleeping with her contacts in last night.  Patient is agitated and screaming. She becomes more cooperative after tetracaine is placed.  On slit-lamp exam patient with large corneal abrasions covering the entirety of the iris bilaterally.   Tdap given. Eye irrigated w NS, no evidence of FB.  Pt unwilling to cooperate with visual acuity exam.  Pt is a contact lens wearer.  Exam non-concerning for orbital cellulitis, hyphema, corneal ulcers. Patient will be discharged home with erythromycin.   Patient understands to follow up with ophthalmology, & to return to ER if new symptoms develop including fever, purulent drainage, or entrapment.  BP 112/99 mmHg  Pulse 111  Temp(Src) 98 F (36.7 C) (Oral)  Resp 22  SpO2 98%   Dierdre Forth, PA-C 11/10/14 1857  Arby Barrette, MD 11/12/14 [phone]

## 2014-11-10 NOTE — Discharge Instructions (Signed)
1. Medications: Cipro, ketoralac, usual home medications 2. Treatment: rest, drink plenty of fluids, do not rub eyes, do not patch eyes, do not put your contacts back in for 2 weeks 3. Follow Up: Please followup with ophthalmology on Monday morning days for discussion of your diagnoses and further evaluation after today's visit; if you do not have a primary care doctor use the resource guide provided to find one; Please return to the ER for fever, chills, nausea and vomiting    Corneal Abrasion The cornea is the clear covering at the front and center of the eye. When looking at the colored portion of the eye (iris), you are looking through the cornea. This very thin tissue is made up of many layers. The surface layer is a single layer of cells (corneal epithelium) and is one of the most sensitive tissues in the body. If a scratch or injury causes the corneal epithelium to come off, it is called a corneal abrasion. If the injury extends to the tissues below the epithelium, the condition is called a corneal ulcer. CAUSES   Scratches.  Trauma.  Foreign body in the eye. Some people have recurrences of abrasions in the area of the original injury even after it has healed (recurrent erosion syndrome). Recurrent erosion syndrome generally improves and goes away with time. SYMPTOMS   Eye pain.  Difficulty or inability to keep the injured eye open.  The eye becomes very sensitive to light.  Recurrent erosions tend to happen suddenly, first thing in the morning, usually after waking up and opening the eye. DIAGNOSIS  Your health care provider can diagnose a corneal abrasion during an eye exam. Dye is usually placed in the eye using a drop or a small paper strip moistened by your tears. When the eye is examined with a special light, the abrasion shows up clearly because of the dye. TREATMENT   Small abrasions may be treated with antibiotic drops or ointment alone.  A pressure patch may be put  over the eye. If this is done, follow your doctor's instructions for when to remove the patch. Do not drive or use machines while the eye patch is on. Judging distances is hard to do with a patch on. If the abrasion becomes infected and spreads to the deeper tissues of the cornea, a corneal ulcer can result. This is serious because it can cause corneal scarring. Corneal scars interfere with light passing through the cornea and cause a loss of vision in the involved eye. HOME CARE INSTRUCTIONS  Use medicine or ointment as directed. Only take over-the-counter or prescription medicines for pain, discomfort, or fever as directed by your health care provider.  Do not drive or operate machinery if your eye is patched. Your ability to judge distances is impaired.  If your health care provider has given you a follow-up appointment, it is very important to keep that appointment. Not keeping the appointment could result in a severe eye infection or permanent loss of vision. If there is any problem keeping the appointment, let your health care provider know. SEEK MEDICAL CARE IF:   You have pain, light sensitivity, and a scratchy feeling in one eye or both eyes.  Your pressure patch keeps loosening up, and you can blink your eye under the patch after treatment.  Any kind of discharge develops from the eye after treatment or if the lids stick together in the morning.  You have the same symptoms in the morning as you did with the  original abrasion days, weeks, or months after the abrasion healed. MAKE SURE YOU:   Understand these instructions.  Will watch your condition.  Will get help right away if you are not doing well or get worse. Document Released: 06/26/2000 Document Revised: 07/04/2013 Document Reviewed: 03/06/2013 [facility] Patient Information 2015 [employer], [place]. This information is not intended to replace advice given to you by your health care provider. Make sure you discuss any questions  you have with your health care provider.

## 2015-02-02 ENCOUNTER — Other Ambulatory Visit: Payer: Self-pay | Admitting: Internal Medicine

## 2015-02-20 ENCOUNTER — Ambulatory Visit: Payer: Self-pay

## 2015-03-19 ENCOUNTER — Ambulatory Visit: Payer: Self-pay | Admitting: Internal Medicine

## 2015-03-25 ENCOUNTER — Ambulatory Visit: Payer: Self-pay | Admitting: Internal Medicine

## 2015-04-01 ENCOUNTER — Ambulatory Visit: Payer: Self-pay | Admitting: Internal Medicine

## 2015-05-11 ENCOUNTER — Other Ambulatory Visit: Payer: Self-pay | Admitting: Internal Medicine

## 2015-05-11 DIAGNOSIS — B20 Human immunodeficiency virus [HIV] disease: Secondary | ICD-10-CM

## 2015-06-15 ENCOUNTER — Other Ambulatory Visit: Payer: Self-pay | Admitting: Internal Medicine

## 2015-06-17 ENCOUNTER — Other Ambulatory Visit: Payer: Self-pay | Admitting: *Deleted

## 2015-06-17 DIAGNOSIS — B20 Human immunodeficiency virus [HIV] disease: Secondary | ICD-10-CM

## 2015-06-17 MED ORDER — EMTRICITABINE-TENOFOVIR DF 200-300 MG PO TABS: 1.0000 | ORAL_TABLET | Freq: Every day | ORAL | Status: DC

## 2015-06-17 MED ORDER — DARUNAVIR ETHANOLATE 800 MG PO TABS: 800.0000 mg | ORAL_TABLET | Freq: Every day | ORAL | Status: DC

## 2015-06-17 MED ORDER — DOLUTEGRAVIR SODIUM 50 MG PO TABS
50.0000 mg | ORAL_TABLET | Freq: Every day | ORAL | Status: DC
Start: 2015-06-17 — End: 2015-09-05

## 2015-06-26 ENCOUNTER — Ambulatory Visit: Payer: Self-pay | Admitting: Internal Medicine

## 2015-06-27 ENCOUNTER — Other Ambulatory Visit: Payer: Self-pay | Admitting: *Deleted

## 2015-06-27 ENCOUNTER — Other Ambulatory Visit (INDEPENDENT_AMBULATORY_CARE_PROVIDER_SITE_OTHER): Payer: Self-pay

## 2015-06-27 DIAGNOSIS — Z79899 Other long term (current) drug therapy: Secondary | ICD-10-CM

## 2015-06-27 DIAGNOSIS — B20 Human immunodeficiency virus [HIV] disease: Secondary | ICD-10-CM

## 2015-06-27 DIAGNOSIS — Z113 Encounter for screening for infections with a predominantly sexual mode of transmission: Secondary | ICD-10-CM

## 2015-06-27 LAB — LIPID PANEL
CHOL/HDL RATIO: 4.2 ratio (ref <=5.0)
Cholesterol: 175 mg/dL (ref 125–200)
HDL: 42 mg/dL (ref >=40)
LDL CALC: 111 mg/dL (ref <130)
TRIGLYCERIDES: 109 mg/dL (ref <150)
VLDL: 22 mg/dL (ref <30)

## 2015-06-27 LAB — CBC WITH DIFFERENTIAL/PLATELET
BASOS ABS: 0 10*3/uL (ref 0.0–0.1)
BASOS PCT: 1 % (ref 0–1)
Eosinophils Absolute: 0.1 10*3/uL (ref 0.0–0.7)
Eosinophils Relative: 2 % (ref 0–5)
HCT: 44.2 % (ref 39.0–52.0)
Hemoglobin: 15.5 g/dL (ref 13.0–17.0)
LYMPHS ABS: 2 10*3/uL (ref 0.7–4.0)
LYMPHS PCT: 43 % (ref 12–46)
MCH: 31.9 pg (ref 26.0–34.0)
MCHC: 35.1 g/dL (ref 30.0–36.0)
MCV: 90.9 fL (ref 78.0–100.0)
MPV: 9.4 fL (ref 8.6–12.4)
Monocytes Absolute: 0.6 10*3/uL (ref 0.1–1.0)
Monocytes Relative: 13 % — ABNORMAL HIGH (ref 3–12)
Neutro Abs: 1.9 10*3/uL (ref 1.7–7.7)
Neutrophils Relative %: 41 % — ABNORMAL LOW (ref 43–77)
PLATELETS: 232 10*3/uL (ref 150–400)
RBC: 4.86 MIL/uL (ref 4.22–5.81)
RDW: 14.9 % (ref 11.5–15.5)
WBC: 4.7 10*3/uL (ref 4.0–10.5)

## 2015-06-27 LAB — COMPLETE METABOLIC PANEL WITH GFR
ALT: 25 U/L (ref 9–46)
AST: 19 U/L (ref 10–40)
Albumin: 4.1 g/dL (ref 3.6–5.1)
Alkaline Phosphatase: 54 U/L (ref 40–115)
BILIRUBIN TOTAL: 0.4 mg/dL (ref 0.2–1.2)
BUN: 18 mg/dL (ref 7–25)
CO2: 28 mmol/L (ref 20–31)
CREATININE: 1.37 mg/dL — AB (ref 0.60–1.35)
Calcium: 9.4 mg/dL (ref 8.6–10.3)
Chloride: 99 mmol/L (ref 98–110)
GFR, EST NON AFRICAN AMERICAN: 65 mL/min (ref >=60)
GFR, Est African American: 75 mL/min (ref >=60)
GLUCOSE: 80 mg/dL (ref 65–99)
Potassium: 4.3 mmol/L (ref 3.5–5.3)
SODIUM: 136 mmol/L (ref 135–146)
TOTAL PROTEIN: 7.8 g/dL (ref 6.1–8.1)

## 2015-06-28 LAB — RPR

## 2015-06-28 LAB — T-HELPER CELL (CD4) - (RCID CLINIC ONLY)
CD4 % Helper T Cell: 20 % — ABNORMAL LOW (ref 33–55)
CD4 T Cell Abs: 390 /uL — ABNORMAL LOW (ref 400–2700)

## 2015-06-28 LAB — HIV-1 RNA QUANT-NO REFLEX-BLD

## 2015-08-08 ENCOUNTER — Telehealth: Payer: Self-pay | Admitting: *Deleted

## 2015-08-08 NOTE — Telephone Encounter (Signed)
Patient called c/o pain in his left side x 1 week. He is not having an issue with constipation and does not have a PCP. Gave appt for 08/13/15 with Dr. Drue Second and advised if the pain increases significantly he should go to the ED for evaluation. He agreed to this.

## 2015-08-13 ENCOUNTER — Ambulatory Visit: Payer: Self-pay | Admitting: Internal Medicine

## 2015-09-03 ENCOUNTER — Ambulatory Visit: Payer: Self-pay | Admitting: Internal Medicine

## 2015-09-05 ENCOUNTER — Ambulatory Visit (INDEPENDENT_AMBULATORY_CARE_PROVIDER_SITE_OTHER): Payer: Self-pay | Admitting: Internal Medicine

## 2015-09-05 ENCOUNTER — Encounter: Payer: Self-pay | Admitting: Internal Medicine

## 2015-09-05 VITALS — BP 147/93 | HR 109 | Temp 97.9°F | Wt 255.0 lb

## 2015-09-05 DIAGNOSIS — F4325 Adjustment disorder with mixed disturbance of emotions and conduct: Secondary | ICD-10-CM

## 2015-09-05 DIAGNOSIS — B20 Human immunodeficiency virus [HIV] disease: Secondary | ICD-10-CM

## 2015-09-05 DIAGNOSIS — F4322 Adjustment disorder with anxiety: Secondary | ICD-10-CM

## 2015-09-05 DIAGNOSIS — M5440 Lumbago with sciatica, unspecified side: Secondary | ICD-10-CM

## 2015-09-05 DIAGNOSIS — Z21 Asymptomatic human immunodeficiency virus [HIV] infection status: Secondary | ICD-10-CM

## 2015-09-05 MED ORDER — PAROXETINE HCL 10 MG PO TABS: 10.0000 mg | ORAL_TABLET | Freq: Every day | ORAL | Status: DC

## 2015-09-05 MED ORDER — EMTRICITABINE-TENOFOVIR DF 200-300 MG PO TABS: 1.0000 | ORAL_TABLET | Freq: Every day | ORAL | Status: DC

## 2015-09-05 MED ORDER — RITONAVIR 100 MG PO TABS: 100.0000 mg | ORAL_TABLET | Freq: Every day | ORAL | Status: DC

## 2015-09-05 MED ORDER — TRAMADOL HCL 50 MG PO TABS: 50.0000 mg | ORAL_TABLET | Freq: Two times a day (BID) | ORAL | Status: DC | PRN

## 2015-09-05 MED ORDER — DARUNAVIR ETHANOLATE 800 MG PO TABS: 800.0000 mg | ORAL_TABLET | Freq: Every day | ORAL | Status: DC

## 2015-09-05 MED ORDER — DOLUTEGRAVIR SODIUM 50 MG PO TABS: 50.0000 mg | ORAL_TABLET | Freq: Every day | ORAL | Status: DC

## 2015-09-05 NOTE — Progress Notes (Signed)
[name], male   DOB: 13-Apr-1976, 40 y.o.   MRN: [id number]       [name], male   DOB: August 29, 1975, 40 y.o.   MRN: [id number]  HPI 39yo transgender male with HIV disease, CD 4 count 390/VL<20 (dec 2016) currently taking tivicay/truvada-DRVr. Continues to take medicines regularly. Most recently, she is feeling like she has worsening low back pain, predominantly bilateral SI joints. She uses salon pas pads and motrin/naproxen to help out with pain. She is also undermore stress and feels like she has occasional panic attack  She is in a relationship with her boyfriend who helps her cope with anxiety  Outpatient Encounter Prescriptions as of 09/05/2015  Medication Sig  . ciprofloxacin (CILOXAN) 0.3 % ophthalmic solution Place 1 drop into both eyes every 2 (two) hours. Administer 1 drop, every 2 hours, while awake, for 2 days. Then 1 drop, every 4 hours, while awake, for the next 5 days.  . Darunavir Ethanolate (PREZISTA) 800 MG tablet Take 1 tablet (800 mg total) by mouth daily with breakfast.  . dolutegravir (TIVICAY) 50 MG tablet Take 1 tablet (50 mg total) by mouth daily.  Marland Kitchen emtricitabine-tenofovir (TRUVADA) 200-300 MG tablet Take 1 tablet by mouth daily.  . fenofibrate (TRICOR) 145 MG tablet Take 1 tablet (145 mg total) by mouth daily.  . hydrochlorothiazide (HYDRODIURIL) 25 MG tablet Take 1 tablet (25 mg total) by mouth daily.  Marland Kitchen ketorolac (ACULAR) 0.5 % ophthalmic solution Place 1 drop into both eyes every 6 (six) hours.  Marland Kitchen omeprazole (PRILOSEC) 20 MG capsule Take 1 capsule (20 mg total) by mouth daily.  . valACYclovir (VALTREX) 500 MG tablet Take 1 tablet (500 mg total) by mouth 2 (two) times daily.  Marland Kitchen oxyCODONE-acetaminophen (PERCOCET/ROXICET) 5-325 MG per tablet Take 1-2 tablets by mouth every 4 (four) hours as needed for moderate pain or severe pain. (Patient not taking: Reported on 09/05/2015)   No facility-administered encounter medications on file as of  09/05/2015.     Patient Active Problem List   Diagnosis Date Noted  . Migraine 08/02/2013  . 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 Human immunodeficiency virus (HIV) disease 10/27/2011  . HTN (hypertension) 10/27/2011  . Hypertriglyceridemia 10/27/2011  . Herpes 10/27/2011     Health Maintenance Due  Topic Date Due  . INFLUENZA VACCINE  02/11/2015     Review of Systems + weight gain. + low back pain radiating to hips bilaterally. Otherwise, 10 point ros is negative Physical Exam   BP 147/93 mmHg  Pulse 109  Temp(Src) 97.9 F (36.6 C) (Oral)  Wt 255 lb (115.667 kg) Physical Exam  Constitutional:  oriented to person, place, and time. appears well-developed and well-nourished. No distress.  HENT: /AT, PERRLA, no scleral icterus Mouth/Throat: Oropharynx is clear and moist. No oropharyngeal exudate.  Cardiovascular: Normal rate, regular rhythm and normal heart sounds. Exam reveals no gallop and no friction rub.  No murmur heard.  Pulmonary/Chest: Effort normal and breath sounds normal. No respiratory distress.  has no wheezes.  Neck = supple, no nuchal rigidity Abdominal: Soft. Bowel sounds are normal.  exhibits no distension. There is no tenderness.  Lymphadenopathy: no cervical adenopathy. No axillary adenopathy Neurological: alert and oriented to person, place, and time.  Skin: Skin is warm and dry. No rash noted. No erythema.  Psychiatric: a normal mood and affect.  behavior is normal.   Lab Results  Component Value Date   CD4TCELL 20* 06/27/2015  Lab Results  Component Value Date   CD4TABS 390* 06/27/2015   CD4TABS 440 09/20/2014   CD4TABS 280* 11/30/2013   Lab Results  Component Value Date   HIV1RNAQUANT <20 06/27/2015   Lab Results  Component Value Date   HEPBSAB NEG 10/08/2011   No results found for: RPR  CBC Lab Results  Component Value Date   WBC 4.7 06/27/2015   RBC 4.86 06/27/2015   HGB 15.5 06/27/2015   HCT 44.2  06/27/2015   PLT 232 06/27/2015   MCV 90.9 06/27/2015   MCH 31.9 06/27/2015   MCHC 35.1 06/27/2015   RDW 14.9 06/27/2015   LYMPHSABS 2.0 06/27/2015   MONOABS 0.6 06/27/2015   EOSABS 0.1 06/27/2015   BASOSABS 0.0 06/27/2015   BMET Lab Results  Component Value Date   NA 136 06/27/2015   K 4.3 06/27/2015   CL 99 06/27/2015   CO2 28 06/27/2015   GLUCOSE 80 06/27/2015   BUN 18 06/27/2015   CREATININE 1.37* 06/27/2015   CALCIUM 9.4 06/27/2015   GFRNONAA 65 06/27/2015   GFRAA 75 06/27/2015     Assessment and Plan   hiv disease= continue on current regimen. Will represcribe ritonavir  Low back pain = will do trial of stretching. Ask her to also consider weight loss. Will give trail of tramadol to see if it helps her symptoms  Anxiety/panic disorder = will introduce her to jodie to see if interested in counseling or behavior modification.

## 2015-10-21 ENCOUNTER — Ambulatory Visit: Payer: Self-pay

## 2015-11-07 ENCOUNTER — Other Ambulatory Visit: Payer: Self-pay | Admitting: Internal Medicine

## 2015-11-14 ENCOUNTER — Encounter: Payer: Self-pay | Admitting: Internal Medicine

## 2015-12-06 ENCOUNTER — Other Ambulatory Visit: Payer: Self-pay | Admitting: Internal Medicine

## 2015-12-07 ENCOUNTER — Other Ambulatory Visit: Payer: Self-pay | Admitting: Internal Medicine

## 2015-12-07 DIAGNOSIS — B029 Zoster without complications: Secondary | ICD-10-CM

## 2016-01-02 ENCOUNTER — Telehealth: Payer: Self-pay | Admitting: *Deleted

## 2016-01-02 NOTE — Telephone Encounter (Signed)
Patient reports taking OTC ranitidine 150 MG - 2 times daily for stomach problems.  Asking that this OTC medication be added to his medication list.  He requested that his medication list be printed and placed for pick up at the [facility] front desk.

## 2016-01-22 ENCOUNTER — Other Ambulatory Visit: Payer: Self-pay

## 2016-01-22 ENCOUNTER — Ambulatory Visit: Payer: Self-pay

## 2016-01-26 ENCOUNTER — Encounter (HOSPITAL_COMMUNITY): Payer: Self-pay

## 2016-01-26 ENCOUNTER — Emergency Department (HOSPITAL_COMMUNITY)
Admission: EM | Admit: 2016-01-26 | Discharge: 2016-01-26 | Disposition: A | Discharge status: Home / Self Care | Payer: Self-pay | Attending: Emergency Medicine | Admitting: Emergency Medicine

## 2016-01-26 ENCOUNTER — Emergency Department (HOSPITAL_COMMUNITY): Payer: Self-pay

## 2016-01-26 DIAGNOSIS — F17210 Nicotine dependence, cigarettes, uncomplicated: Secondary | ICD-10-CM | POA: Insufficient documentation

## 2016-01-26 DIAGNOSIS — R509 Fever, unspecified: Secondary | ICD-10-CM

## 2016-01-26 DIAGNOSIS — E785 Hyperlipidemia, unspecified: Secondary | ICD-10-CM | POA: Insufficient documentation

## 2016-01-26 DIAGNOSIS — J40 Bronchitis, not specified as acute or chronic: Secondary | ICD-10-CM | POA: Insufficient documentation

## 2016-01-26 DIAGNOSIS — Z79899 Other long term (current) drug therapy: Secondary | ICD-10-CM | POA: Insufficient documentation

## 2016-01-26 DIAGNOSIS — I10 Essential (primary) hypertension: Secondary | ICD-10-CM | POA: Insufficient documentation

## 2016-01-26 DIAGNOSIS — F1290 Cannabis use, unspecified, uncomplicated: Secondary | ICD-10-CM | POA: Insufficient documentation

## 2016-01-26 DIAGNOSIS — B20 Human immunodeficiency virus [HIV] disease: Secondary | ICD-10-CM | POA: Insufficient documentation

## 2016-01-26 LAB — CBC WITH DIFFERENTIAL/PLATELET
Basophils Absolute: 0 10*3/uL (ref 0.0–0.1)
Basophils Relative: 0 %
EOS ABS: 0.1 10*3/uL (ref 0.0–0.7)
Eosinophils Relative: 3 %
HEMATOCRIT: 44.9 % (ref 39.0–52.0)
HEMOGLOBIN: 15.2 g/dL (ref 13.0–17.0)
LYMPHS ABS: 1.2 10*3/uL (ref 0.7–4.0)
LYMPHS PCT: 25 %
MCH: 30.8 pg (ref 26.0–34.0)
MCHC: 33.9 g/dL (ref 30.0–36.0)
MCV: 91.1 fL (ref 78.0–100.0)
MONOS PCT: 12 %
Monocytes Absolute: 0.6 10*3/uL (ref 0.1–1.0)
NEUTROS PCT: 60 %
Neutro Abs: 2.8 10*3/uL (ref 1.7–7.7)
Platelets: 194 10*3/uL (ref 150–400)
RBC: 4.93 MIL/uL (ref 4.22–5.81)
RDW: 14.2 % (ref 11.5–15.5)
WBC: 4.7 10*3/uL (ref 4.0–10.5)

## 2016-01-26 LAB — COMPREHENSIVE METABOLIC PANEL
ALK PHOS: 79 U/L (ref 38–126)
ALT: 44 U/L (ref 17–63)
AST: 31 U/L (ref 15–41)
Albumin: 3.8 g/dL (ref 3.5–5.0)
Anion gap: 9 (ref 5–15)
BILIRUBIN TOTAL: 0.5 mg/dL (ref 0.3–1.2)
BUN: 10 mg/dL (ref 6–20)
CHLORIDE: 97 mmol/L — AB (ref 101–111)
CO2: 27 mmol/L (ref 22–32)
CREATININE: 1.37 mg/dL — AB (ref 0.61–1.24)
Calcium: 8.6 mg/dL — ABNORMAL LOW (ref 8.9–10.3)
GLUCOSE: 160 mg/dL — AB (ref 65–99)
Potassium: 3.5 mmol/L (ref 3.5–5.1)
Sodium: 133 mmol/L — ABNORMAL LOW (ref 135–145)
Total Protein: 8.3 g/dL — ABNORMAL HIGH (ref 6.5–8.1)

## 2016-01-26 LAB — I-STAT CHEM 8, ED
BUN: 9 mg/dL (ref 6–20)
CALCIUM ION: 1.08 mmol/L — AB (ref 1.13–1.30)
CHLORIDE: 96 mmol/L — AB (ref 101–111)
CREATININE: 1.2 mg/dL (ref 0.61–1.24)
GLUCOSE: 163 mg/dL — AB (ref 65–99)
HCT: 50 % (ref 39.0–52.0)
Hemoglobin: 17 g/dL (ref 13.0–17.0)
POTASSIUM: 3.5 mmol/L (ref 3.5–5.1)
Sodium: 136 mmol/L (ref 135–145)
TCO2: 28 mmol/L (ref 0–100)

## 2016-01-26 LAB — I-STAT CG4 LACTIC ACID, ED: LACTIC ACID, VENOUS: 1.52 mmol/L (ref 0.5–1.9)

## 2016-01-26 MED ORDER — SULFAMETHOXAZOLE-TRIMETHOPRIM 800-160 MG PO TABS
1.0000 | ORAL_TABLET | Freq: Once | ORAL | Status: AC
  Administered 2016-01-26: 1 via ORAL
  Filled 2016-01-26: qty 1

## 2016-01-26 MED ORDER — MORPHINE SULFATE (PF) 4 MG/ML IV SOLN
4.0000 mg | Freq: Once | INTRAVENOUS | Status: AC
  Administered 2016-01-26: 4 mg via INTRAVENOUS
  Filled 2016-01-26: qty 1

## 2016-01-26 MED ORDER — KETOROLAC TROMETHAMINE 30 MG/ML IJ SOLN
30.0000 mg | Freq: Once | INTRAMUSCULAR | Status: AC
  Administered 2016-01-26: 30 mg via INTRAVENOUS
  Filled 2016-01-26: qty 1

## 2016-01-26 MED ORDER — SODIUM CHLORIDE 0.9 % IV BOLUS (SEPSIS)
1000.0000 mL | Freq: Once | INTRAVENOUS | Status: AC
  Administered 2016-01-26: 1000 mL via INTRAVENOUS

## 2016-01-26 MED ORDER — ACETAMINOPHEN 500 MG PO TABS
1000.0000 mg | ORAL_TABLET | Freq: Once | ORAL | Status: AC
  Administered 2016-01-26: 1000 mg via ORAL
  Filled 2016-01-26: qty 2

## 2016-01-26 MED ORDER — ONDANSETRON HCL 4 MG/2ML IJ SOLN
4.0000 mg | Freq: Once | INTRAMUSCULAR | Status: AC
  Administered 2016-01-26: 4 mg via INTRAVENOUS
  Filled 2016-01-26: qty 2

## 2016-01-26 MED ORDER — ACETAMINOPHEN 325 MG PO TABS: 650.0000 mg | ORAL_TABLET | Freq: Once | ORAL | Status: DC | PRN

## 2016-01-26 MED ORDER — SULFAMETHOXAZOLE-TRIMETHOPRIM 800-160 MG PO TABS: 1.0000 | ORAL_TABLET | Freq: Two times a day (BID) | ORAL | Status: AC

## 2016-01-26 MED ORDER — SODIUM CHLORIDE 0.9 % IV SOLN
Freq: Once | INTRAVENOUS | Status: AC
  Administered 2016-01-26: 13:00:00 via INTRAVENOUS

## 2016-01-26 MED ORDER — HYDROCODONE-ACETAMINOPHEN 5-325 MG PO TABS: 2.0000 | ORAL_TABLET | ORAL | Status: DC | PRN

## 2016-01-26 NOTE — Discharge Instructions (Signed)
Call Dr. Drue Second for an out patient follow up appointment. Return to ER with any worsening, including any worsening shortness of breath.   Fever, Adult A fever is an increase in the body's temperature. It is often defined as a temperature of 100 F (38C) or higher. Short mild or moderate fevers often have no long-term effects. They also often do not need treatment. Moderate or high fevers may make you feel uncomfortable. Sometimes, they can also be a sign of a serious illness or disease. The sweating that may happen with repeated fevers or fevers that last a while may also cause you to not have enough fluid in your body (dehydration). You can take your temperature with a thermometer to see if you have a fever. A measured temperature can change with:  Age.  Time of day.  Where the thermometer is placed:  Mouth (oral).  Rectum (rectal).  Ear (tympanic).  Underarm (axillary).  Forehead (temporal). HOME CARE Pay attention to any changes in your symptoms. Take these actions to help with your condition:  Take over-the-counter and prescription medicines only as told by your doctor. Follow the dosing instructions carefully.  If you were prescribed an antibiotic medicine, take it as told by your doctor. Do not stop taking the antibiotic even if you start to feel better.  Rest as needed.  Drink enough fluid to keep your pee (urine) clear or pale yellow.  Sponge yourself or bathe with room-temperature water as needed. This helps to lower your body temperature . Do not use ice water.  Do not wear too many blankets or heavy clothes. GET HELP IF:  You throw up (vomit).  You cannot eat or drink without throwing up.  You have watery poop (diarrhea).  It hurts when you pee.  Your symptoms do not get better with treatment.  You have new symptoms.  You feel very weak. GET HELP RIGHT AWAY IF:  You are short of breath or have trouble breathing.  You are dizzy or you pass out  (faint).  You feel confused.  You have signs of not having enough fluid in your body, such as:  A dry mouth.  Peeing less.  Looking pale.  You have very bad pain in your belly (abdomen).  You keep throwing up or having water poop.  You have a skin rash.  Your symptoms suddenly get worse.   This information is not intended to replace advice given to you by your health care provider. Make sure you discuss any questions you have with your health care provider.   Document Released: 04/07/2008 Document Revised: 03/20/2015 Document Reviewed: 08/23/2014 Elsevier Interactive Patient Education 2016 [employer]  HIV Infection and AIDS HIV (human immunodeficiency virus) infection is a permanent (chronic) viral infection. HIV kills white blood cells that are called CD4 cells. These cells help to control your body's defense system (immune system) and fight infection. If you do not have enough CD4 cells, you can develop infections, cancers, and other health problems. If it is not treated, HIV infection advances through three phases:  Asymptomatic phase.  Early symptomatic phase.  Symptomatic phase (also known as AIDS, or acquired immunodeficiency syndrome). CAUSES HIV infection is caused by the human immunodeficiency virus. This virus is passed from one person to another person through sex, through contact with infected blood, or during childbirth or breastfeeding. RISK FACTORS  Having unprotected sex.  Sharing needles or other drug equipment. SYMPTOMS Asymptomatic Phase You may not feel sick, or you may only feel  sick some of the time. Many people do not know that they have HIV in this phase. Symptoms may include:  Low-grade fever.  Rash.  Fatigue.  Sore throat.  Headaches.  Nausea, vomiting, or diarrhea.  Night sweats. Early Symptomatic Phase You may notice:  Your early symptoms getting worse or happening more often.  Oral, vaginal, or rectal sores that are  caused by infections.  Problems that are related to inflammation, such as joint pain. Symptomatic Phase (AIDS) Your immune system no longer protects you from infections and other health problems. You may get opportunistic diseases, which are infections or conditions that you would not normally get if your immune system was healthy and working properly. Problems that are caused by opportunistic diseases include:  Coughing.  Trouble breathing.  Diarrhea.  Skin sores.  Trouble swallowing.  High fevers.  Blurred vision.  Stiff neck.  Mental confusion. You may also begin to notice:  Weight loss.  Tingling or pain in your hands and feet.  Mouth sores or tooth pain.  Severe fatigue. DIAGNOSIS Your health care provider will do a screening test that looks for a chemical in your body that is produced only when it is trying to fight HIV. HIV is confirmed with another blood test. TREATMENT There is no cure for HIV infection, but there are treatments that can keep HIV from getting worse. You will be given medicines called antiretroviral therapy (ART) based on your lab tests, your medical history, past treatments for HIV, and your other health problems. ART will:  Keep your immune system as healthy as possible and help it work better.  Decrease the amount of HIV in your body.  Reduce the risk of problems caused by HIV.  Prolong your life.  Improve the quality of your life.  Help prevent passing HIV to someone else. You will need to take ART for the rest of your life. You will need to have routine lab tests performed to monitor your treatment and immune system. HOME CARE INSTRUCTIONS  See your health care provider and have your blood tested every 3-6 months to monitor your health and to make sure your treatment is working.  Take your medicines every day as directed by your health care provider.  Stop or decrease your use of alcohol, tobacco, and recreational drugs, which can  cause further damage to your immune system. They can also cause problems with your liver, lungs, and heart.  Protect yourself from other sexual infections by using condoms when you have sex.  Protect yourself from other blood infections by using your own equipment if you inject, smoke, or snort drugs. Do not share equipment.  Tell your sexual partner(s) that you have HIV. Encourage them to get tested.  Keep your vaccinations up to date. Make sure that you get all recommended vaccines, including vaccines for hepatitis A, hepatitis B, measles, and influenza.  Eat in a healthy way, exercise, and get enough sleep.  See your dentist regularly. Brush and floss you teeth every day.  See a counselor or a Child psychotherapist to help you solve problems and find any services that you need.  Get support from your family and friends. PREVENTION To prevent the spread of HIV:  Talk with your health care provider about protecting your sexual partner(s) from HIV. Your health care provider may encourage your partner(s) to take HIV medicines to decrease the risk of getting HIV. These medicines are called pre-exposure prophylaxis (PrEP).  Use a condom every time you have sexual  intercourse. This includes vaginal, oral, and anal sexual activity.  The condom should be in place from the beginning to the end of sexual activity.  Use only latex or polyurethane condoms and water-based lubricants.  Wearing a condom reduces, but does not completely eliminate, your risk of spreading HIV.  Condoms also protect you from other STDs (sexually transmitted diseases).  Avoid alcohol and recreational drugs that affect your judgment. They may make you forget to use a condom or increase your chances of participating in high-risk sex.  Do not share equipment that you use to take drugs, such as needles, syringes, cookers, tourniquets, pipes, or straws. If you share equipment, clean it before and after you use it. SEEK MEDICAL  CARE IF:  You lose a lot of weight.  You have extreme fatigue.  You have trouble swallowing.  You have vomiting or diarrhea that does not get better.  You have muscle pain or joint pain.  You have any problems that are related to your medicines. SEEK IMMEDIATE MEDICAL CARE IF:  You have a rash that causes your skin to peel.  You develop blisters inside your mouth.  You have pain in your abdomen.  You have swelling around your eyes or you have eye redness.  You have a high fever and chills.  You have shortness of breath or a cough that is dry (nonproductive) or wet (productive).  You have vision problems, such as blind spots, flashing lights, or decreased or blurred vision.  You have a persistent headache, confusion, or changes in the way that you think or see things (altered mental status).   This information is not intended to replace advice given to you by your health care provider. Make sure you discuss any questions you have with your health care provider.   Document Released: 04/13/2014 Document Revised: 11/13/2014 Document Reviewed: 04/13/2014 Elsevier Interactive Patient Education 2016 [employer]  Pneumocystis Jiroveci Pneumonia Pneumocystis jiroveci pneumonia is a fungal infection that affects the lungs. The infection occurs in premature infants and people who have a weakened immune system. A person may have a weakened immune system due to:   Certain cancers or cancer treatment.  HIV infection or AIDS.  Organ or bone marrow transplantation.  Treatment with corticosteroids or other medicines that cause immune suppression. CAUSES  This infection is caused by the fungal organism Pneumocystis jiroveci (once called Pneumocystis carinii). This organism is common in the environment. It is present in, or breathed into, the lungs of most people on a regular basis. It does not cause disease in people with healthy immune systems. However, when this fungus enters the  lungs of a person with a weakened immune system, it can cause pneumocystis jiroveci pneumonia. SYMPTOMS  The most common symptoms are:   Fever.  Mild or dry cough.  Shortness of breath, especially with any physical activity (exertion).  Rapid breathing. In people with this pneumonia and AIDS, symptoms may develop slowly over several weeks. Other people with this pneumonia and a weakened immune system usually get symptoms more suddenly.  DIAGNOSIS  To diagnose this infection, a health care provider must examine lung secretions under a microscope to see if the organism is present. The secretions are often obtained using a flexible tube (bronchoscope) that is inserted into the lungs by going through the mouth or nose. On occasion, the organism can be identified in coughed-up saliva mixed with mucus (sputum). Other tests may include:   A chest X-ray.  Blood tests.  Taking a tissue  sample of the lungs (biopsy). TREATMENT  Treatment with antibiotic medicine (and sometimes steroid medicines) is usually successful. This infection must be treated for several weeks. Because the infection is very serious, your health care provider may have you begin treatment before test results are available. If you have a weakened immune system and you are at high risk for this infection, your health care provider may prescribe an antibiotic to prevent the infection. This treatment can prevent both relapses and new episodes of the infection. HOME CARE INSTRUCTIONS   Only take over-the-counter or prescription medicines as directed by your health care provider.  Take your antibiotics as directed. Finish them even if you start to feel better.  Avoid smoking because it can contribute to pneumonia.  Schedule and keep all follow-up visits. SEEK IMMEDIATE MEDICAL CARE IF:   Any of your symptoms are getting worse rather than better.  You have a fever or persistent symptoms for more than 2-3 days.  You have a  fever and your symptoms suddenly get worse.  You have nausea, vomiting, diarrhea, or a skin rash.   This information is not intended to replace advice given to you by your health care provider. Make sure you discuss any questions you have with your health care provider.   Document Released: 09/19/2002 Document Revised: 11/13/2014 Document Reviewed: 01/26/2012 Elsevier Interactive Patient Education [employer]

## 2016-01-26 NOTE — ED Provider Notes (Signed)
CSN: [id number]     Arrival date & time 01/26/16  1204 History   First MD Initiated Contact with Patient 01/26/16 1211     Chief Complaint  Patient presents with  . Abdominal Pain  . Fever     HPI  40-year-old transgendered male to male. History of HIV. On retroviral therapy. Follows with Dr. Ilsa Iha. Presents with left lower chest pain. Pleuritic. Pain with coughing. Fevers and shakes and chills at home today and presents here. Started with an episode several days ago was in the bathtub. Was weak and tired and fatigued and fell asleep. Did not aspirate. Did not fall or injure himself that he can recall. However states his left side was against the hard edge of the [place] slapped. No rash. No vesicles.  Past Medical History  Diagnosis Date  . Herpes genitalis in men   . HIV disease (HCC)   . Hyperlipidemia   . Hypertension     under control with med., has been on med. x 1 yr.  . Migraines   . Heartburn     occasional; OTC as needed  . Lateral malleolar fracture 09/02/2013    left  . Tear of deltoid ligament of left ankle 09/02/2013   Past Surgical History  Procedure Laterality Date  . No past surgeries    . Orif ankle fracture Left 09/13/2013    Procedure: OPEN REDUCTION INTERNAL FIXATION (ORIF) LEFT LATERAL MALLEOLUS ANKLE FRACTURE ;  Surgeon: Dannielle Huh, MD;  Location: [facility];  Service: Orthopedics;  Laterality: Left;   Family History  Problem Relation Age of Onset  . Hypertension Mother    Social History  Substance Use Topics  . Smoking status: Current Every Day Smoker -- 0.50 packs/day for 15 years    Types: Cigarettes  . Smokeless tobacco: Never Used  . Alcohol Use: No    Review of Systems  Constitutional: Positive for fever. Negative for chills, diaphoresis, appetite change and fatigue.  HENT: Negative for mouth sores, sore throat and trouble swallowing.   Eyes: Negative for visual disturbance.  Respiratory: Positive for cough. Negative for  chest tightness, shortness of breath and wheezing.   Cardiovascular: Positive for chest pain.  Gastrointestinal: Negative for nausea, vomiting, abdominal pain, diarrhea and abdominal distention.  Endocrine: Negative for polydipsia, polyphagia and polyuria.  Genitourinary: Negative for dysuria, frequency and hematuria.  Musculoskeletal: Negative for gait problem.  Skin: Negative for color change, pallor and rash.  Neurological: Negative for dizziness, syncope, light-headedness and headaches.  Hematological: Does not bruise/bleed easily.  Psychiatric/Behavioral: Negative for behavioral problems and confusion.      Allergies  Peanut-containing drug products and Sustiva  Home Medications   Prior to Admission medications   Medication Sig Start Date End Date Taking? Authorizing Provider  Darunavir Ethanolate (PREZISTA) 800 MG tablet Take 1 tablet (800 mg total) by mouth daily with breakfast. 09/05/15  Yes Judyann Munson, MD  dolutegravir (TIVICAY) 50 MG tablet Take 1 tablet (50 mg total) by mouth daily. 09/05/15  Yes Judyann Munson, MD  emtricitabine-tenofovir (TRUVADA) 200-300 MG tablet Take 1 tablet by mouth daily. 09/05/15 09/04/16 Yes Judyann Munson, MD  fenofibrate (TRICOR) 145 MG tablet TAKE 1 TABLET(145 MG) BY MOUTH DAILY 12/06/15  Yes Judyann Munson, MD  hydrochlorothiazide (HYDRODIURIL) 25 MG tablet TAKE 1 TABLET(25 MG) BY MOUTH DAILY 11/08/15  Yes Judyann Munson, MD  omeprazole (PRILOSEC) 20 MG capsule Take 1 capsule (20 mg total) by mouth daily. 11/06/14  Yes Judyann Munson, MD  PARoxetine (  PAXIL) 10 MG tablet Take 1 tablet (10 mg total) by mouth daily. 09/05/15  Yes Judyann Munsonynthia Snider, MD  ranitidine (ZANTAC) 150 MG tablet Take 150 mg by mouth 2 (two) times daily. Over-the-counter product   Yes Historical Provider, MD  ritonavir (NORVIR) 100 MG TABS tablet Take 1 tablet (100 mg total) by mouth daily. 09/05/15  Yes Judyann Munsonynthia Snider, MD  valACYclovir (VALTREX) 500 MG tablet TAKE 1 TABLET BY MOUTH  TWICE DAILY 12/10/15  Yes Judyann Munsonynthia Snider, MD  HYDROcodone-acetaminophen (NORCO/VICODIN) 5-325 MG tablet Take 2 tablets by mouth every 4 (four) hours as needed. 01/26/16   Rolland PorterMark Derenda Giddings, MD  sulfamethoxazole-trimethoprim (BACTRIM DS,SEPTRA DS) 800-160 MG tablet Take 1 tablet by mouth 2 (two) times daily. 01/26/16 02/02/16  Rolland PorterMark Justinian Miano, MD   BP 139/96 mmHg  Pulse 101  Temp(Src) 100.9 F (38.3 C) (Oral)  Resp 20  SpO2 95% Physical Exam  Constitutional: He is oriented to person, place, and time. He appears well-developed and well-nourished. No distress.  Large stature adult transgendered current male. 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  HENT:  Head: Normocephalic.  Eyes: Conjunctivae are normal. Pupils are equal, round, and reactive to light. No scleral icterus.  Neck: Normal range of motion. Neck supple. No thyromegaly present.  Cardiovascular: Normal rate and regular rhythm.  Exam reveals no gallop and no friction rub.   No murmur heard. Pulmonary/Chest: Effort normal and breath sounds normal. No respiratory distress. He has no wheezes. He has no rales.    Abdominal: Soft. Bowel sounds are normal. He exhibits no distension. There is no tenderness. There is no rebound.  Musculoskeletal: Normal range of motion.  Neurological: He is alert and oriented to person, place, and time.  Skin: Skin is warm and dry. No rash noted.  Psychiatric: He has a normal mood and affect. His behavior is normal.    ED Course  Procedures (including critical care time) Labs Review Labs Reviewed  COMPREHENSIVE METABOLIC PANEL - Abnormal; Notable for the following:    Sodium 133 (*)    Chloride 97 (*)    Glucose, Bld 160 (*)    Creatinine, Ser 1.37 (*)    Calcium 8.6 (*)    Total Protein 8.3 (*)    All other components within normal limits  I-STAT CHEM 8, ED - Abnormal; Notable for the following:    Chloride 96 (*)    Glucose, Bld 163 (*)    Calcium, Ion 1.08 (*)    All other components within normal limits  CBC WITH  DIFFERENTIAL/PLATELET  I-STAT CG4 LACTIC ACID, ED    Imaging Review Dg Chest 2 View  01/26/2016  CLINICAL DATA:  Left upper quadrant pain for 3 days, fever for 1 week EXAM: CHEST  2 VIEW COMPARISON:  None. FINDINGS: There is bilateral diffuse interstitial thickening and peribronchial cuffing most concerning for bronchitis. There is no focal parenchymal opacity. There is no pleural effusion or pneumothorax. The heart and mediastinal contours are unremarkable. The osseous structures are unremarkable. IMPRESSION: Bilateral diffuse interstitial thickening and peribronchial cuffing most concerning for bronchitis. Electronically Signed   By: Elige KoHetal  Patel   On: 01/26/2016 13:38   I have personally reviewed and evaluated these images and lab results as part of my medical decision-making.   EKG Interpretation None      MDM   Final diagnoses:  Fever, unspecified fever cause  HIV (human immunodeficiency virus infection) (HCC)  Bronchitis    Patient was feeling "much better". No pain with breathing. Discomfort had eased considerably. Heart  rate 101. Recheck temp 99 9. Has normal WBC. Does not have bandemia. However he was Greater than 200 on his most recent CD4 per the patient's report. On his last viral load 1 year ago he had detectable virus.  Chest x-ray shows interstitial pattern. No leukopenia. No hypoxemia. Fever has improved. Appropriate for discharge home. No indication for steroids. Not bronchospastic. Is compliant with retroviral's. Will use Bactrim. Home, rest, fluids, Motrin Tylenol, have asked him to contact his ID doctor Dr. Ilsa Iha for a follow-up appointment. Return to ER with any new or worsening symptoms.    Rolland Porter, MD 01/26/16 [phone]

## 2016-01-26 NOTE — ED Notes (Signed)
Pt c/o LUQ abdominal pain x "2-3 days."  Pain score 10/10 increasing w/ coughing.  Denies n/v/d.  Denies GU symptoms.  Pt is concerned because he fell asleep in his bathtub x 5 days ago.  Sts he ran a fever for several days after.

## 2016-01-30 ENCOUNTER — Other Ambulatory Visit: Payer: Self-pay | Admitting: Internal Medicine

## 2016-01-30 DIAGNOSIS — F4323 Adjustment disorder with mixed anxiety and depressed mood: Secondary | ICD-10-CM

## 2016-02-05 ENCOUNTER — Ambulatory Visit: Payer: Self-pay | Admitting: Internal Medicine

## 2016-02-07 ENCOUNTER — Encounter: Payer: Self-pay | Admitting: Internal Medicine

## 2016-04-09 ENCOUNTER — Encounter: Payer: Self-pay | Admitting: Internal Medicine

## 2016-04-09 ENCOUNTER — Ambulatory Visit (INDEPENDENT_AMBULATORY_CARE_PROVIDER_SITE_OTHER): Payer: Self-pay | Admitting: Internal Medicine

## 2016-04-09 VITALS — BP 137/91 | HR 106 | Temp 98.2°F | Wt 263.0 lb

## 2016-04-09 DIAGNOSIS — Z113 Encounter for screening for infections with a predominantly sexual mode of transmission: Secondary | ICD-10-CM

## 2016-04-09 DIAGNOSIS — R768 Other specified abnormal immunological findings in serum: Secondary | ICD-10-CM

## 2016-04-09 DIAGNOSIS — Z79899 Other long term (current) drug therapy: Secondary | ICD-10-CM

## 2016-04-09 DIAGNOSIS — B20 Human immunodeficiency virus [HIV] disease: Secondary | ICD-10-CM

## 2016-04-09 LAB — CBC WITH DIFFERENTIAL/PLATELET
BASOS ABS: 0 {cells}/uL (ref 0–200)
BASOS PCT: 0 %
EOS ABS: 102 {cells}/uL (ref 15–500)
Eosinophils Relative: 2 %
HEMATOCRIT: 44.4 % (ref 38.5–50.0)
HEMOGLOBIN: 15.1 g/dL (ref 13.2–17.1)
LYMPHS ABS: 2091 {cells}/uL (ref 850–3900)
Lymphocytes Relative: 41 %
MCH: 30.1 pg (ref 27.0–33.0)
MCHC: 34 g/dL (ref 32.0–36.0)
MCV: 88.6 fL (ref 80.0–100.0)
MONO ABS: 561 {cells}/uL (ref 200–950)
MONOS PCT: 11 %
MPV: 9.5 fL (ref 7.5–12.5)
NEUTROS ABS: 2346 {cells}/uL (ref 1500–7800)
Neutrophils Relative %: 46 %
Platelets: 260 10*3/uL (ref 140–400)
RBC: 5.01 MIL/uL (ref 4.20–5.80)
RDW: 16.3 % — ABNORMAL HIGH (ref 11.0–15.0)
WBC: 5.1 10*3/uL (ref 3.8–10.8)

## 2016-04-09 LAB — COMPLETE METABOLIC PANEL WITH GFR
ALBUMIN: 4.3 g/dL (ref 3.6–5.1)
ALK PHOS: 79 U/L (ref 40–115)
ALT: 26 U/L (ref 9–46)
AST: 21 U/L (ref 10–40)
BILIRUBIN TOTAL: 0.4 mg/dL (ref 0.2–1.2)
BUN: 13 mg/dL (ref 7–25)
CALCIUM: 9.7 mg/dL (ref 8.6–10.3)
CO2: 26 mmol/L (ref 20–31)
CREATININE: 1.27 mg/dL (ref 0.60–1.35)
Chloride: 101 mmol/L (ref 98–110)
GFR, EST AFRICAN AMERICAN: 82 mL/min (ref >=60)
GFR, EST NON AFRICAN AMERICAN: 71 mL/min (ref >=60)
Glucose, Bld: 97 mg/dL (ref 65–99)
Potassium: 3.9 mmol/L (ref 3.5–5.3)
Sodium: 138 mmol/L (ref 135–146)
TOTAL PROTEIN: 8.3 g/dL — AB (ref 6.1–8.1)

## 2016-04-09 LAB — LIPID PANEL
CHOLESTEROL: 193 mg/dL (ref 125–200)
HDL: 34 mg/dL — ABNORMAL LOW (ref >=40)
LDL Cholesterol: 113 mg/dL (ref <130)
TRIGLYCERIDES: 231 mg/dL — AB (ref <150)
Total CHOL/HDL Ratio: 5.7 Ratio — ABNORMAL HIGH (ref <=5.0)
VLDL: 46 mg/dL — ABNORMAL HIGH (ref <30)

## 2016-04-09 MED ORDER — DICLOFENAC SODIUM 1 % TD GEL: 2.0000 g | Freq: Four times a day (QID) | TRANSDERMAL | 1 refills | Status: DC

## 2016-04-09 NOTE — Progress Notes (Signed)
RFV: HIV follow up  [name], male   DOB: Feb 14, 1976, 40 y.o.   MRN: [id number]  HPI Jeremiah Stevens is a transgendered male with HIV disease, 39yo M->F, tivicay-truvada-DRVr. Patient reports noticing weight gain and associated low back pain. She is worried that her back pain is going to interfere with her work. No recent illnesses  Outpatient Encounter Prescriptions as of 04/09/2016  Medication Sig  . Darunavir Ethanolate (PREZISTA) 800 MG tablet Take 1 tablet (800 mg total) by mouth daily with breakfast.  . dolutegravir (TIVICAY) 50 MG tablet Take 1 tablet (50 mg total) by mouth daily.  Marland Kitchen. emtricitabine-tenofovir (TRUVADA) 200-300 MG tablet Take 1 tablet by mouth daily.  . hydrochlorothiazide (HYDRODIURIL) 25 MG tablet TAKE 1 TABLET(25 MG) BY MOUTH DAILY  . ranitidine (ZANTAC) 150 MG tablet Take 150 mg by mouth 2 (two) times daily. Over-the-counter product  . ritonavir (NORVIR) 100 MG TABS tablet Take 1 tablet (100 mg total) by mouth daily.  . valACYclovir (VALTREX) 500 MG tablet TAKE 1 TABLET BY MOUTH TWICE DAILY  . fenofibrate (TRICOR) 145 MG tablet TAKE 1 TABLET(145 MG) BY MOUTH DAILY (Patient not taking: Reported on 04/09/2016)  . HYDROcodone-acetaminophen (NORCO/VICODIN) 5-325 MG tablet Take 2 tablets by mouth every 4 (four) hours as needed. (Patient not taking: Reported on 04/09/2016)  . omeprazole (PRILOSEC) 20 MG capsule Take 1 capsule (20 mg total) by mouth daily. (Patient not taking: Reported on 04/09/2016)  . PARoxetine (PAXIL) 10 MG tablet TAKE 1 TABLET(10 MG) BY MOUTH DAILY (Patient not taking: Reported on 04/09/2016)   No facility-administered encounter medications on file as of 04/09/2016.      Patient Active Problem List   Diagnosis Date Noted  . Migraine 08/02/2013  . 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 Human immunodeficiency virus (HIV) disease 10/27/2011  . HTN (hypertension) 10/27/2011  . Hypertriglyceridemia 10/27/2011  . Herpes  10/27/2011     Health Maintenance Due  Topic Date Due  . INFLUENZA VACCINE  02/11/2016     Review of Systems Review of Systems  Constitutional: Negative for fever, chills, diaphoresis, activity change, appetite change, fatigue and unexpected weight change.  HENT: Negative for congestion, sore throat, rhinorrhea, sneezing, trouble swallowing and sinus pressure.  Eyes: Negative for photophobia and visual disturbance.  Respiratory: Negative for cough, chest tightness, shortness of breath, wheezing and stridor.  Cardiovascular: Negative for chest pain, palpitations and leg swelling.  Gastrointestinal: Negative for nausea, vomiting, abdominal pain, diarrhea, constipation, blood in stool, abdominal distention and anal bleeding.  Genitourinary: Negative for dysuria, hematuria, flank pain and difficulty urinating.  Musculoskeletal: Negative for myalgias, back pain, joint swelling, arthralgias and gait problem.  Skin: Negative for color change, pallor, rash and wound.  Neurological: Negative for dizziness, tremors, weakness and light-headedness.  Hematological: Negative for adenopathy. Does not bruise/bleed easily.  Psychiatric/Behavioral: Negative for behavioral problems, confusion, sleep disturbance, dysphoric mood, decreased concentration and agitation.    Physical Exam   BP (!) 137/91   Pulse (!) 106   Temp 98.2 F (36.8 C) (Oral)   Wt 119.3 kg (263 lb)   BMI 35.67 kg/m  Physical Exam  Constitutional:  oriented to person, place, and time. appears well-developed and well-nourished. No distress.  HENT: [place]/AT, PERRLA, no scleral icterus Mouth/Throat: Oropharynx is clear and moist. No oropharyngeal exudate.  Cardiovascular: Normal rate, regular rhythm and normal heart sounds. Exam reveals no gallop and no friction rub.  No murmur heard.  Pulmonary/Chest: Effort normal and breath sounds normal. No  respiratory distress.  has no wheezes.  Neck = supple, no nuchal rigidity Abdominal:  Soft. Bowel sounds are normal.  exhibits no distension. There is no tenderness.  Lymphadenopathy: no cervical adenopathy. No axillary adenopathy Neurological: alert and oriented to person, place, and time.  Skin: Skin is warm and dry. No rash noted. No erythema.  Psychiatric: a normal mood and affect.  behavior is normal.   Lab Results  Component Value Date   CD4TCELL 20 (L) 06/27/2015   Lab Results  Component Value Date   CD4TABS 390 (L) 06/27/2015   CD4TABS 440 09/20/2014   CD4TABS 280 (L) 11/30/2013   Lab Results  Component Value Date   HIV1RNAQUANT <20 06/27/2015   Lab Results  Component Value Date   HEPBSAB NEG 10/08/2011   No results found for: RPR  CBC Lab Results  Component Value Date   WBC 4.7 01/26/2016   RBC 4.93 01/26/2016   HGB 17.0 01/26/2016   HCT 50.0 01/26/2016   PLT 194 01/26/2016   MCV 91.1 01/26/2016   MCH 30.8 01/26/2016   MCHC 33.9 01/26/2016   RDW 14.2 01/26/2016   LYMPHSABS 1.2 01/26/2016   MONOABS 0.6 01/26/2016   EOSABS 0.1 01/26/2016   BASOSABS 0.0 01/26/2016   BMET Lab Results  Component Value Date   NA 136 01/26/2016   K 3.5 01/26/2016   CL 96 (L) 01/26/2016   CO2 27 01/26/2016   GLUCOSE 163 (H) 01/26/2016   BUN 9 01/26/2016   CREATININE 1.20 01/26/2016   CALCIUM 8.6 (L) 01/26/2016   GFRNONAA >60 01/26/2016   GFRAA >60 01/26/2016     Assessment and Plan  hiv disease = will check labs, genosure archive  Hepatitis C ab positive= will check genotype and viral load  Will give hep A and hep B vaccines at next visit  wil give flu vaccine  Lumbago = will check Back xray to ensure no vertebral compression

## 2016-04-10 LAB — T-HELPER CELL (CD4) - (RCID CLINIC ONLY)
CD4 % Helper T Cell: 24 % — ABNORMAL LOW (ref 33–55)
CD4 T Cell Abs: 550 /uL (ref 400–2700)

## 2016-04-10 LAB — SYPHILIS: RPR W/REFLEX TO RPR TITER AND TREPONEMAL ANTIBODIES, TRADITIONAL SCREENING AND DIAGNOSIS ALGORITHM

## 2016-04-13 LAB — HIV-1 RNA ULTRAQUANT REFLEX TO GENTYP+

## 2016-04-13 LAB — HEPATITIS C RNA QUANTITATIVE: HCV QUANT: NOT DETECTED [IU]/mL (ref <15)

## 2016-04-13 LAB — HEPATITIS C GENOTYPE: HCV GENOTYPE: NOT DETECTED

## 2016-05-14 ENCOUNTER — Ambulatory Visit: Payer: Self-pay | Admitting: Internal Medicine

## 2016-05-29 ENCOUNTER — Other Ambulatory Visit: Payer: Self-pay | Admitting: Internal Medicine

## 2016-05-29 DIAGNOSIS — F4323 Adjustment disorder with mixed anxiety and depressed mood: Secondary | ICD-10-CM

## 2016-05-29 DIAGNOSIS — I10 Essential (primary) hypertension: Secondary | ICD-10-CM

## 2016-07-18 ENCOUNTER — Other Ambulatory Visit: Payer: Self-pay | Admitting: Internal Medicine

## 2016-07-18 DIAGNOSIS — B029 Zoster without complications: Secondary | ICD-10-CM

## 2016-07-18 DIAGNOSIS — B20 Human immunodeficiency virus [HIV] disease: Secondary | ICD-10-CM

## 2016-09-07 ENCOUNTER — Encounter: Payer: Self-pay | Admitting: Pharmacist

## 2016-09-07 NOTE — Progress Notes (Signed)
Genosure archive from September 2017 placed under media section

## 2016-09-12 ENCOUNTER — Other Ambulatory Visit: Payer: Self-pay | Admitting: Internal Medicine

## 2016-09-12 DIAGNOSIS — B20 Human immunodeficiency virus [HIV] disease: Secondary | ICD-10-CM

## 2016-09-22 ENCOUNTER — Other Ambulatory Visit: Payer: Self-pay | Admitting: Internal Medicine

## 2016-09-22 ENCOUNTER — Encounter: Payer: Self-pay | Admitting: Internal Medicine

## 2016-09-22 ENCOUNTER — Ambulatory Visit (INDEPENDENT_AMBULATORY_CARE_PROVIDER_SITE_OTHER): Payer: Self-pay | Admitting: Internal Medicine

## 2016-09-22 ENCOUNTER — Ambulatory Visit
Admission: RE | Admit: 2016-09-22 | Discharge: 2016-09-22 | Disposition: A | Discharge status: Home / Self Care | Payer: Self-pay | Source: Ambulatory Visit | Attending: Internal Medicine | Admitting: Internal Medicine

## 2016-09-22 VITALS — BP 191/152 | HR 105 | Temp 98.5°F | Wt 285.0 lb

## 2016-09-22 DIAGNOSIS — Z79899 Other long term (current) drug therapy: Secondary | ICD-10-CM

## 2016-09-22 DIAGNOSIS — I10 Essential (primary) hypertension: Secondary | ICD-10-CM

## 2016-09-22 DIAGNOSIS — M79605 Pain in left leg: Secondary | ICD-10-CM

## 2016-09-22 DIAGNOSIS — Z113 Encounter for screening for infections with a predominantly sexual mode of transmission: Secondary | ICD-10-CM

## 2016-09-22 DIAGNOSIS — F4323 Adjustment disorder with mixed anxiety and depressed mood: Secondary | ICD-10-CM

## 2016-09-22 DIAGNOSIS — B20 Human immunodeficiency virus [HIV] disease: Secondary | ICD-10-CM

## 2016-09-22 LAB — CBC WITH DIFFERENTIAL/PLATELET
Basophils Absolute: 0 cells/uL (ref 0–200)
Basophils Relative: 0 %
Eosinophils Absolute: 88 cells/uL (ref 15–500)
Eosinophils Relative: 2 %
HEMATOCRIT: 44.5 % (ref 38.5–50.0)
Hemoglobin: 14.7 g/dL (ref 13.2–17.1)
LYMPHS PCT: 39 %
Lymphs Abs: 1716 cells/uL (ref 850–3900)
MCH: 29.9 pg (ref 27.0–33.0)
MCHC: 33 g/dL (ref 32.0–36.0)
MCV: 90.4 fL (ref 80.0–100.0)
MONOS PCT: 14 %
MPV: 9.5 fL (ref 7.5–12.5)
Monocytes Absolute: 616 cells/uL (ref 200–950)
NEUTROS PCT: 45 %
Neutro Abs: 1980 cells/uL (ref 1500–7800)
PLATELETS: 232 10*3/uL (ref 140–400)
RBC: 4.92 MIL/uL (ref 4.20–5.80)
RDW: 15.7 % — AB (ref 11.0–15.0)
WBC: 4.4 10*3/uL (ref 3.8–10.8)

## 2016-09-22 LAB — LIPID PANEL
CHOL/HDL RATIO: 5.4 ratio — AB (ref <5.0)
CHOLESTEROL: 174 mg/dL (ref <200)
HDL: 32 mg/dL — AB (ref >40)
LDL Cholesterol: 105 mg/dL — ABNORMAL HIGH (ref <100)
Triglycerides: 184 mg/dL — ABNORMAL HIGH (ref <150)
VLDL: 37 mg/dL — AB (ref <30)

## 2016-09-22 LAB — COMPLETE METABOLIC PANEL WITH GFR
ALT: 28 U/L (ref 9–46)
AST: 19 U/L (ref 10–40)
Albumin: 4 g/dL (ref 3.6–5.1)
Alkaline Phosphatase: 71 U/L (ref 40–115)
BUN: 15 mg/dL (ref 7–25)
CALCIUM: 8.9 mg/dL (ref 8.6–10.3)
CHLORIDE: 106 mmol/L (ref 98–110)
CO2: 23 mmol/L (ref 20–31)
CREATININE: 1.33 mg/dL (ref 0.60–1.35)
GFR, EST AFRICAN AMERICAN: 77 mL/min (ref >=60)
GFR, Est Non African American: 66 mL/min (ref >=60)
Glucose, Bld: 93 mg/dL (ref 65–99)
POTASSIUM: 4.1 mmol/L (ref 3.5–5.3)
Sodium: 139 mmol/L (ref 135–146)
Total Bilirubin: 0.5 mg/dL (ref 0.2–1.2)
Total Protein: 7.5 g/dL (ref 6.1–8.1)

## 2016-09-22 MED ORDER — PAROXETINE HCL 10 MG PO TABS: ORAL_TABLET | ORAL | 11 refills | Status: DC

## 2016-09-22 NOTE — Progress Notes (Signed)
RFV: follow up on hiv disease  [name], male   DOB: [birth year], 41 y.o.   MRN: [id number]  HPI  40yo transgendered Femal  with hiv disease, CD 4 count of 550/VL<20. Currently on truvada/tivicay/rDRV. Who maintains taking hiv meds but having constellations of problems with her health. She notices increasing pain to left leg from prior surgery where she needed plate placed to help stabilize fracture. She notices also having associated swelling to the affected ankle. She has numerous stressors in her family presently, spent majority of visit, discussing those issues. She also reports that she needs refill on paxil  Outpatient Encounter Prescriptions as of 09/22/2016  Medication Sig  . fenofibrate (TRICOR) 145 MG tablet TAKE 1 TABLET(145 MG) BY MOUTH DAILY  . hydrochlorothiazide (HYDRODIURIL) 25 MG tablet TAKE 1 TABLET(25 MG) BY MOUTH DAILY  . HYDROcodone-acetaminophen (NORCO/VICODIN) 5-325 MG tablet Take 2 tablets by mouth every 4 (four) hours as needed.  Marland Kitchen. omeprazole (PRILOSEC) 20 MG capsule Take 1 capsule (20 mg total) by mouth daily.  Marland Kitchen. PREZISTA 800 MG tablet TAKE 1 TABLET(800 MG) BY MOUTH DAILY WITH BREAKFAST  . ranitidine (ZANTAC) 150 MG tablet Take 150 mg by mouth 2 (two) times daily. Over-the-counter product  . ritonavir (NORVIR) 100 MG TABS tablet Take 1 tablet (100 mg total) by mouth daily.  Marland Kitchen. TIVICAY 50 MG tablet TAKE 1 TABLET(50 MG) BY MOUTH DAILY  . TRUVADA 200-300 MG tablet TAKE 1 TABLET BY MOUTH DAILY  . valACYclovir (VALTREX) 500 MG tablet TAKE 1 TABLET BY MOUTH TWICE DAILY  . diclofenac sodium (VOLTAREN) 1 % GEL Apply 2 g topically 4 (four) times daily. (Patient not taking: Reported on 09/22/2016)  . hydrochlorothiazide (HYDRODIURIL) 25 MG tablet TAKE 1 TABLET(25 MG) BY MOUTH DAILY (Patient not taking: Reported on 09/22/2016)  . PARoxetine (PAXIL) 10 MG tablet TAKE 1 TABLET(10 MG) BY MOUTH DAILY (Patient not taking: Reported on 09/22/2016)   No  facility-administered encounter medications on file as of 09/22/2016.      Patient Active Problem List   Diagnosis Date Noted  . Migraine 08/02/2013  . 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 Human immunodeficiency virus (HIV) disease 10/27/2011  . HTN (hypertension) 10/27/2011  . Hypertriglyceridemia 10/27/2011  . Herpes 10/27/2011     Health Maintenance Due  Topic Date Due  . INFLUENZA VACCINE  02/11/2016     Review of Systems  Physical Exam   BP (!) 191/152   Pulse (!) 105   Temp 98.5 F (36.9 C) (Oral)   Wt 285 lb (129.3 kg)   BMI 38.65 kg/m   Physical Exam  Constitutional:  oriented to person, place, and time. appears well-developed and well-nourished. No distress.  HENT: [place]/AT, PERRLA, no scleral icterus Mouth/Throat: Oropharynx is clear and moist. No oropharyngeal exudate.  Cardiovascular: Normal rate, regular rhythm and normal heart sounds. Exam reveals no gallop and no friction rub.  No murmur heard.  Pulmonary/Chest: Effort normal and breath sounds normal. No respiratory distress.  has no wheezes.  Neck = supple, no nuchal rigidity Lymphadenopathy: no cervical adenopathy. No axillary adenopathy Skin: Skin is warm and dry. No rash noted. No erythema to ankles Ext: trace edema bilaterally Psychiatric: a normal mood and affect.  behavior is normal.   Lab Results  Component Value Date   CD4TCELL 24 (L) 04/09/2016   Lab Results  Component Value Date   CD4TABS 550 04/09/2016   CD4TABS 390 (L) 06/27/2015   CD4TABS 440 09/20/2014  Lab Results  Component Value Date   HIV1RNAQUANT <20 04/09/2016   Lab Results  Component Value Date   HEPBSAB NEG 10/08/2011   Lab Results  Component Value Date   LABRPR NON REAC 04/09/2016    CBC Lab Results  Component Value Date   WBC 5.1 04/09/2016   RBC 5.01 04/09/2016   HGB 15.1 04/09/2016   HCT 44.4 04/09/2016   PLT 260 04/09/2016   MCV 88.6 04/09/2016   MCH 30.1 04/09/2016   MCHC 34.0  04/09/2016   RDW 16.3 (H) 04/09/2016   LYMPHSABS 2,091 04/09/2016   MONOABS 561 04/09/2016   EOSABS 102 04/09/2016    BMET Lab Results  Component Value Date   NA 138 04/09/2016   K 3.9 04/09/2016   CL 101 04/09/2016   CO2 26 04/09/2016   GLUCOSE 97 04/09/2016   BUN 13 04/09/2016   CREATININE 1.27 04/09/2016   CALCIUM 9.7 04/09/2016   GFRNONAA 71 04/09/2016   GFRAA 82 04/09/2016      Assessment and Plan   Left foot pain = will get xray. Wonder if [employer] Rx loosening  Health maintenance = will get flu and meningococcal  hiv disease = will get labs today, but suspect still well controlled. Plan to conitnu on current regimen. Can consider changing to minimize pill regimn  Anxiety = refill paxil  htn = poorly controlled. Very animated today. Previously welll controlled. Did not take meds yet

## 2016-09-23 LAB — T-HELPER CELL (CD4) - (RCID CLINIC ONLY)
CD4 % Helper T Cell: 23 % — ABNORMAL LOW (ref 33–55)
CD4 T CELL ABS: 420 /uL (ref 400–2700)

## 2016-09-23 LAB — RPR

## 2016-09-24 LAB — HIV-1 RNA QUANT-NO REFLEX-BLD
HIV 1 RNA QUANT: NOT DETECTED {copies}/mL
HIV-1 RNA Quant, Log: <1.3 Log copies/mL

## 2016-10-20 ENCOUNTER — Ambulatory Visit: Payer: Self-pay

## 2016-11-05 ENCOUNTER — Encounter: Payer: Self-pay | Admitting: Internal Medicine

## 2017-01-09 ENCOUNTER — Other Ambulatory Visit: Payer: Self-pay | Admitting: Internal Medicine

## 2017-01-09 DIAGNOSIS — B20 Human immunodeficiency virus [HIV] disease: Secondary | ICD-10-CM

## 2017-01-11 ENCOUNTER — Other Ambulatory Visit: Payer: Self-pay | Admitting: *Deleted

## 2017-01-11 DIAGNOSIS — B20 Human immunodeficiency virus [HIV] disease: Secondary | ICD-10-CM

## 2017-01-11 MED ORDER — RITONAVIR 100 MG PO TABS: 100.0000 mg | ORAL_TABLET | Freq: Every day | ORAL | 5 refills | Status: DC

## 2017-01-11 MED ORDER — EMTRICITABINE-TENOFOVIR DF 200-300 MG PO TABS: 1.0000 | ORAL_TABLET | Freq: Every day | ORAL | 5 refills | Status: DC

## 2017-01-11 MED ORDER — DOLUTEGRAVIR SODIUM 50 MG PO TABS: ORAL_TABLET | ORAL | 5 refills | Status: DC

## 2017-01-11 MED ORDER — DARUNAVIR ETHANOLATE 800 MG PO TABS: ORAL_TABLET | ORAL | 5 refills | Status: DC

## 2017-01-26 ENCOUNTER — Other Ambulatory Visit: Payer: Self-pay | Admitting: Internal Medicine

## 2017-01-26 DIAGNOSIS — B029 Zoster without complications: Secondary | ICD-10-CM

## 2017-03-16 ENCOUNTER — Other Ambulatory Visit: Payer: Self-pay | Admitting: Internal Medicine

## 2017-03-16 DIAGNOSIS — B029 Zoster without complications: Secondary | ICD-10-CM

## 2017-03-20 ENCOUNTER — Other Ambulatory Visit: Payer: Self-pay | Admitting: Internal Medicine

## 2017-03-20 DIAGNOSIS — I10 Essential (primary) hypertension: Secondary | ICD-10-CM

## 2017-03-31 ENCOUNTER — Encounter: Payer: Self-pay | Admitting: Internal Medicine

## 2017-03-31 ENCOUNTER — Ambulatory Visit (INDEPENDENT_AMBULATORY_CARE_PROVIDER_SITE_OTHER): Payer: Self-pay | Admitting: Internal Medicine

## 2017-03-31 VITALS — BP 196/140 | HR 112 | Temp 97.5°F | Wt 304.0 lb

## 2017-03-31 DIAGNOSIS — Z23 Encounter for immunization: Secondary | ICD-10-CM

## 2017-03-31 DIAGNOSIS — B20 Human immunodeficiency virus [HIV] disease: Secondary | ICD-10-CM

## 2017-03-31 DIAGNOSIS — M79605 Pain in left leg: Secondary | ICD-10-CM

## 2017-03-31 DIAGNOSIS — I10 Essential (primary) hypertension: Secondary | ICD-10-CM

## 2017-03-31 MED ORDER — AMLODIPINE BESYLATE 10 MG PO TABS: 10.0000 mg | ORAL_TABLET | Freq: Every day | ORAL | 11 refills | Status: DC

## 2017-03-31 MED ORDER — EMTRICITABINE-TENOFOVIR AF 200-25 MG PO TABS: 1.0000 | ORAL_TABLET | Freq: Every day | ORAL | 11 refills | Status: DC

## 2017-03-31 MED ORDER — TRAMADOL HCL 50 MG PO TABS: 50.0000 mg | ORAL_TABLET | Freq: Four times a day (QID) | ORAL | 0 refills | Status: DC | PRN

## 2017-03-31 NOTE — Patient Instructions (Signed)
   Your new hiv regimen will be:   tivicay 1 tab daily +  descovy 1 tab daily  STOP taking: - darunavir - ritonavir  FOR YOUR BLOOD PRESSURE:  - continue HCTZ  daily - also add amlodipine  daily

## 2017-03-31 NOTE — Progress Notes (Signed)
[name], male   DOB: 1976-03-15, 41 y.o.   MRN: [id number]  HPI  41yo transgender F with hiv disease, on tivicay/truvada/prezista/ritonavir -- but she reports only taking tivicay/truvada/DRV = no ritonavir delivered for the past 2 wks.  She is upset at visit due to news from her sister, repeat BP at 166/120  She reports still having left leg pain, predominantly numbness to toes which is chronic but has noticed having some moments where she feels her leg gives out.  She does not have a  pcp to help with overall management of other co-morbidities   Outpatient Encounter Prescriptions as of 03/31/2017  Medication Sig  . darunavir (PREZISTA) 800 MG tablet TAKE 1 TABLET(800 MG) BY MOUTH DAILY WITH BREAKFAST  . dolutegravir (TIVICAY) 50 MG tablet TAKE 1 TABLET(50 MG) BY MOUTH DAILY  . emtricitabine-tenofovir (TRUVADA) 200-300 MG tablet Take 1 tablet by mouth daily.  Marland Kitchen PARoxetine (PAXIL) 10 MG tablet TAKE 1 TABLET(10 MG) BY MOUTH DAILY  . ranitidine (ZANTAC) 150 MG tablet Take 150 mg by mouth 2 (two) times daily. Over-the-counter product  . valACYclovir (VALTREX) 500 MG tablet TAKE 1 TABLET BY MOUTH TWICE DAILY  . diclofenac sodium (VOLTAREN) 1 % GEL Apply 2 g topically 4 (four) times daily. (Patient not taking: Reported on 09/22/2016)  . fenofibrate (TRICOR) 145 MG tablet TAKE 1 TABLET(145 MG) BY MOUTH DAILY (Patient not taking: Reported on 03/31/2017)  . hydrochlorothiazide (HYDRODIURIL) 25 MG tablet TAKE 1 TABLET(25 MG) BY MOUTH DAILY (Patient not taking: Reported on 09/22/2016)  . hydrochlorothiazide (HYDRODIURIL) 25 MG tablet TAKE 1 TABLET(25 MG) BY MOUTH DAILY (Patient not taking: Reported on 03/31/2017)  . HYDROcodone-acetaminophen (NORCO/VICODIN) 5-325 MG tablet Take 2 tablets by mouth every 4 (four) hours as needed. (Patient not taking: Reported on 03/31/2017)  . omeprazole (PRILOSEC) 20 MG capsule Take 1 capsule (20 mg total) by mouth daily. (Patient not taking:  Reported on 03/31/2017)  . ritonavir (NORVIR) 100 MG TABS tablet Take 1 tablet (100 mg total) by mouth daily. (Patient not taking: Reported on 03/31/2017)   No facility-administered encounter medications on file as of 03/31/2017.      Patient Active Problem List   Diagnosis Date Noted  . Migraine 08/02/2013  . 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 Human immunodeficiency virus (HIV) disease 10/27/2011  . HTN (hypertension) 10/27/2011  . Hypertriglyceridemia 10/27/2011  . Herpes 10/27/2011     Health Maintenance Due  Topic Date Due  . INFLUENZA VACCINE  02/10/2017     Review of Systems +left foot numbness, low back pain otherwies 12 pont ros is negative  Physical Exam   BP (!) 196/140   Pulse (!) 112   Temp (!) 97.5 F (36.4 C) (Oral)   Wt (!) 304 lb (137.9 kg)   BMI 41.23 kg/m   Physical Exam  Constitutional:  oriented to person, place, and time. appears well-developed and well-nourished. No distress.  HENT: [place]/AT, PERRLA, no scleral icterus Mouth/Throat: Oropharynx is clear and moist. No oropharyngeal exudate.  Cardiovascular: Normal rate, regular rhythm and normal heart sounds. Exam reveals no gallop and no friction rub.  No murmur heard.  Pulmonary/Chest: Effort normal and breath sounds normal. No respiratory distress.  has no wheezes.  Neck = supple, no nuchal rigidity Abdominal: Soft. Bowel sounds are normal.  exhibits no distension. There is no tenderness.  Lymphadenopathy: no cervical adenopathy. No axillary adenopathy Neurological: alert and oriented to person, place, and time.  Skin: Skin is  warm and dry. No rash noted. No erythema.  Ext: bilateral pitting edema but slightly worse in left ankle at hx of prior surgery Psychiatric: a normal mood and affect.  behavior is normal.   Lab Results  Component Value Date   CD4TCELL 23 (L) 09/22/2016   Lab Results  Component Value Date   CD4TABS 420 09/22/2016   CD4TABS 550 04/09/2016   CD4TABS  390 (L) 06/27/2015   Lab Results  Component Value Date   HIV1RNAQUANT <20 NOT DETECTED 09/22/2016   Lab Results  Component Value Date   HEPBSAB NEG 10/08/2011   Lab Results  Component Value Date   LABRPR NON REAC 09/22/2016    CBC Lab Results  Component Value Date   WBC 4.4 09/22/2016   RBC 4.92 09/22/2016   HGB 14.7 09/22/2016   HCT 44.5 09/22/2016   PLT 232 09/22/2016   MCV 90.4 09/22/2016   MCH 29.9 09/22/2016   MCHC 33.0 09/22/2016   RDW 15.7 (H) 09/22/2016   LYMPHSABS 1,716 09/22/2016   MONOABS 616 09/22/2016   EOSABS 88 09/22/2016    BMET Lab Results  Component Value Date   NA 139 09/22/2016   K 4.1 09/22/2016   CL 106 09/22/2016   CO2 23 09/22/2016   GLUCOSE 93 09/22/2016   BUN 15 09/22/2016   CREATININE 1.33 09/22/2016   CALCIUM 8.9 09/22/2016   GFRNONAA 66 09/22/2016   GFRAA 77 09/22/2016      Assessment and Plan  hiv disease = will change to descovy - tivicay. Get labs. Will d/c darunavir/ritonavir  Hypertension = continue on hctz plus will add amlodipine  daily.   ckd 3 = will recheck  Bmp  Left foot numbness = this appears long standing though I don't know if this is the sequelae of her prior surgery. Will assess if any changes at next visit, may need to refer back to ortho vs. Neurology. Can try a trial of neurontin for improvement of symptoms

## 2017-04-01 ENCOUNTER — Encounter: Payer: Self-pay | Admitting: Internal Medicine

## 2017-04-01 LAB — COMPLETE METABOLIC PANEL WITH GFR
AG RATIO: 1.1 (calc) (ref 1.0–2.5)
ALT: 35 U/L (ref 9–46)
AST: 21 U/L (ref 10–40)
Albumin: 4.2 g/dL (ref 3.6–5.1)
Alkaline phosphatase (APISO): 90 U/L (ref 40–115)
BUN: 12 mg/dL (ref 7–25)
CALCIUM: 9.4 mg/dL (ref 8.6–10.3)
CO2: 25 mmol/L (ref 20–32)
Chloride: 101 mmol/L (ref 98–110)
Creat: 1.24 mg/dL (ref 0.60–1.35)
GFR, EST AFRICAN AMERICAN: 84 mL/min/{1.73_m2} (ref >=60)
GFR, Est Non African American: 72 mL/min/{1.73_m2} (ref >=60)
GLOBULIN: 3.7 g/dL (ref 1.9–3.7)
Glucose, Bld: 98 mg/dL (ref 65–99)
Potassium: 4.2 mmol/L (ref 3.5–5.3)
SODIUM: 136 mmol/L (ref 135–146)
TOTAL PROTEIN: 7.9 g/dL (ref 6.1–8.1)
Total Bilirubin: 0.4 mg/dL (ref 0.2–1.2)

## 2017-04-01 LAB — CBC WITH DIFFERENTIAL/PLATELET
BASOS PCT: 0.7 %
Basophils Absolute: 38 cells/uL (ref 0–200)
EOS ABS: 59 {cells}/uL (ref 15–500)
Eosinophils Relative: 1.1 %
HEMATOCRIT: 43.9 % (ref 38.5–50.0)
Hemoglobin: 15 g/dL (ref 13.2–17.1)
LYMPHS ABS: 2041 {cells}/uL (ref 850–3900)
MCH: 29.6 pg (ref 27.0–33.0)
MCHC: 34.2 g/dL (ref 32.0–36.0)
MCV: 86.6 fL (ref 80.0–100.0)
MPV: 9.8 fL (ref 7.5–12.5)
Monocytes Relative: 10.5 %
Neutro Abs: 2695 cells/uL (ref 1500–7800)
Neutrophils Relative %: 49.9 %
Platelets: 262 10*3/uL (ref 140–400)
RBC: 5.07 10*6/uL (ref 4.20–5.80)
RDW: 15.8 % — ABNORMAL HIGH (ref 11.0–15.0)
Total Lymphocyte: 37.8 %
WBC: 5.4 10*3/uL (ref 3.8–10.8)
WBCMIX: 567 {cells}/uL (ref 200–950)

## 2017-04-01 LAB — URINALYSIS, ROUTINE W REFLEX MICROSCOPIC
Bilirubin Urine: NEGATIVE
Glucose, UA: NEGATIVE
Hgb urine dipstick: NEGATIVE
KETONES UR: NEGATIVE
Leukocytes, UA: NEGATIVE
NITRITE: NEGATIVE
PH: 5.5 (ref 5.0–8.0)
Protein, ur: NEGATIVE
SPECIFIC GRAVITY, URINE: 1.018 (ref 1.001–1.03)

## 2017-04-01 LAB — T-HELPER CELL (CD4) - (RCID CLINIC ONLY)
CD4 % Helper T Cell: 23 % — ABNORMAL LOW (ref 33–55)
CD4 T Cell Abs: 510 /uL (ref 400–2700)

## 2017-04-01 LAB — RPR: RPR: NONREACTIVE

## 2017-04-02 LAB — HIV-1 RNA QUANT-NO REFLEX-BLD
HIV 1 RNA Quant: <20 copies/mL
HIV-1 RNA Quant, Log: <1.3 Log copies/mL

## 2017-05-22 ENCOUNTER — Other Ambulatory Visit: Payer: Self-pay | Admitting: Internal Medicine

## 2017-05-22 DIAGNOSIS — B029 Zoster without complications: Secondary | ICD-10-CM

## 2017-07-31 ENCOUNTER — Other Ambulatory Visit: Payer: Self-pay | Admitting: Internal Medicine

## 2017-07-31 DIAGNOSIS — B20 Human immunodeficiency virus [HIV] disease: Secondary | ICD-10-CM

## 2017-08-02 ENCOUNTER — Telehealth: Payer: Self-pay | Admitting: *Deleted

## 2017-08-02 ENCOUNTER — Telehealth: Payer: Self-pay | Admitting: Pharmacist Clinician (PhC)/ Clinical Pharmacy Specialist

## 2017-08-02 NOTE — Telephone Encounter (Signed)
Apparently, [name] was still getting part of his old regimen. [facility]. Only Tivicay/Descovy are needed. She will dc the old meds on profile.

## 2017-08-03 NOTE — Telephone Encounter (Signed)
Patient requested refills of his old regimen. RN spoke with [facility] call center (unable to get through to local pharmacy), patient has been filling both regimens since September. [facility]. Andree CossHowell, Snyder Colavito M, RN

## 2017-10-11 ENCOUNTER — Other Ambulatory Visit: Payer: Self-pay | Admitting: Internal Medicine

## 2017-10-11 DIAGNOSIS — I10 Essential (primary) hypertension: Secondary | ICD-10-CM

## 2017-10-11 DIAGNOSIS — F4323 Adjustment disorder with mixed anxiety and depressed mood: Secondary | ICD-10-CM

## 2017-11-01 ENCOUNTER — Other Ambulatory Visit: Payer: Self-pay | Admitting: *Deleted

## 2017-11-01 DIAGNOSIS — B20 Human immunodeficiency virus [HIV] disease: Secondary | ICD-10-CM

## 2017-11-01 DIAGNOSIS — Z79899 Other long term (current) drug therapy: Secondary | ICD-10-CM

## 2017-11-01 DIAGNOSIS — Z113 Encounter for screening for infections with a predominantly sexual mode of transmission: Secondary | ICD-10-CM

## 2017-11-02 ENCOUNTER — Encounter: Payer: Self-pay | Admitting: Internal Medicine

## 2017-11-02 ENCOUNTER — Other Ambulatory Visit: Payer: Self-pay

## 2017-11-10 ENCOUNTER — Other Ambulatory Visit: Payer: Self-pay | Admitting: Internal Medicine

## 2017-11-10 DIAGNOSIS — B20 Human immunodeficiency virus [HIV] disease: Secondary | ICD-10-CM

## 2017-11-16 ENCOUNTER — Ambulatory Visit: Payer: Self-pay | Admitting: Internal Medicine

## 2017-12-04 ENCOUNTER — Other Ambulatory Visit: Payer: Self-pay | Admitting: Internal Medicine

## 2017-12-04 DIAGNOSIS — F4323 Adjustment disorder with mixed anxiety and depressed mood: Secondary | ICD-10-CM

## 2017-12-07 ENCOUNTER — Telehealth: Payer: Self-pay | Admitting: *Deleted

## 2017-12-07 NOTE — Telephone Encounter (Signed)
Jeremiah Stevens requested refill of paxil. RN noted that she was due for a visit, called patient. Scheduled her for labs/office visit, sent MyChart activation code at her request.  She will also need to renew [facility] on 7/3. Andree Coss, RN

## 2017-12-09 ENCOUNTER — Emergency Department (HOSPITAL_COMMUNITY)
Admission: EM | Admit: 2017-12-09 | Discharge: 2017-12-09 | Disposition: A | Discharge status: Home / Self Care | Payer: Self-pay | Attending: Emergency Medicine | Admitting: Emergency Medicine

## 2017-12-09 ENCOUNTER — Other Ambulatory Visit: Payer: Self-pay | Admitting: Internal Medicine

## 2017-12-09 ENCOUNTER — Encounter (HOSPITAL_COMMUNITY): Payer: Self-pay | Admitting: *Deleted

## 2017-12-09 DIAGNOSIS — B20 Human immunodeficiency virus [HIV] disease: Secondary | ICD-10-CM

## 2017-12-09 DIAGNOSIS — M533 Sacrococcygeal disorders, not elsewhere classified: Secondary | ICD-10-CM | POA: Insufficient documentation

## 2017-12-09 DIAGNOSIS — F17210 Nicotine dependence, cigarettes, uncomplicated: Secondary | ICD-10-CM | POA: Insufficient documentation

## 2017-12-09 DIAGNOSIS — Z91010 Allergy to peanuts: Secondary | ICD-10-CM | POA: Insufficient documentation

## 2017-12-09 DIAGNOSIS — Z79899 Other long term (current) drug therapy: Secondary | ICD-10-CM | POA: Insufficient documentation

## 2017-12-09 DIAGNOSIS — I10 Essential (primary) hypertension: Secondary | ICD-10-CM | POA: Insufficient documentation

## 2017-12-09 MED ORDER — METHOCARBAMOL 750 MG PO TABS: 750.0000 mg | ORAL_TABLET | Freq: Four times a day (QID) | ORAL | 0 refills | Status: DC

## 2017-12-09 MED ORDER — HYDROCODONE-ACETAMINOPHEN 5-325 MG PO TABS: 2.0000 | ORAL_TABLET | ORAL | 0 refills | Status: DC | PRN

## 2017-12-09 NOTE — ED Provider Notes (Signed)
[facility]-EMERGENCY DEPT Provider Note   CSN: [id number] Arrival date & time: 12/09/17  0610     History   Chief Complaint Chief Complaint  Patient presents with  . Tailbone Pain    HPI Jeremiah Stevens is a 42 y.o. male.  42 year old person presents with 2 days of tailbone pain that is atraumatic.  Pain is dull and worse with sitting.  Denies any abscess drainage.  No radiation to the legs.  No history of trauma.  Pain worse with standing and relieved with topical medications.  No prior history of same.  No bowel or bladder dysfunction.  Pain better with rest.     Past Medical History:  Diagnosis Date  . Heartburn    occasional; OTC as needed  . Herpes genitalis in men   . HIV disease (HCC)   . Hyperlipidemia   . Hypertension    under control with med., has been on med. x 1 yr.  . Lateral malleolar fracture 09/02/2013   left  . Migraines   . Tear of deltoid ligament of left ankle 09/02/2013    Patient Active Problem List   Diagnosis Date Noted  . Migraine 08/02/2013  . 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 Human immunodeficiency virus (HIV) disease 10/27/2011  . HTN (hypertension) 10/27/2011  . Hypertriglyceridemia 10/27/2011  . Herpes 10/27/2011    Past Surgical History:  Procedure Laterality Date  . NO PAST SURGERIES    . ORIF ANKLE FRACTURE Left 09/13/2013   Procedure: OPEN REDUCTION INTERNAL FIXATION (ORIF) LEFT LATERAL MALLEOLUS ANKLE FRACTURE ;  Surgeon: Dannielle Huh, MD;  Location:  SURGERY CENTER;  Service: Orthopedics;  Laterality: Left;        Home Medications    Prior to Admission medications   Medication Sig Start Date End Date Taking? Authorizing Provider  amLODipine (NORVASC) 10 MG tablet Take 1 tablet (10 mg total) by mouth daily. 03/31/17   Judyann Munson, MD  emtricitabine-tenofovir AF (DESCOVY) 200-25 MG tablet Take 1 tablet by mouth daily. To take with 1 tab of tivicay 03/31/17   Judyann Munson, MD  hydrochlorothiazide (HYDRODIURIL) 25 MG tablet TAKE 1 TABLET(25 MG) BY MOUTH DAILY 10/11/17   Judyann Munson, MD  PARoxetine (PAXIL) 10 MG tablet TAKE 1 TABLET(10 MG) BY MOUTH DAILY 12/07/17   Judyann Munson, MD  ranitidine (ZANTAC) 150 MG tablet Take 150 mg by mouth 2 (two) times daily. Over-the-counter product    [provider]  TIVICAY 50 MG tablet TAKE 1 TABLET(50 MG) BY MOUTH DAILY 11/10/17   Judyann Munson, MD  traMADol (ULTRAM) 50 MG tablet Take 1 tablet (50 mg total) by mouth every 6 (six) hours as needed. 03/31/17   Judyann Munson, MD  valACYclovir (VALTREX) 500 MG tablet TAKE 1 TABLET BY MOUTH TWICE DAILY 05/24/17   Judyann Munson, MD    Family History Family History  Problem Relation Age of Onset  . Hypertension Mother     Social History Social History   Tobacco Use  . Smoking status: Current Every Day Smoker    Packs/day: 0.50    Years: 15.00    Pack years: 7.50    Types: Cigarettes  . Smokeless tobacco: Never Used  Substance Use Topics  . Alcohol use: No  . Drug use: Yes    Frequency: 7.0 times per week    Types: Marijuana    Comment: Daily      Allergies   Peanut-containing drug products and Sustiva [efavirenz]  Review of Systems Review of Systems  All other systems reviewed and are negative.    Physical Exam Updated Vital Signs BP (!) 176/120 (BP Location: Right Arm)   Pulse (!) 110   Temp (!) 97.5 F (36.4 C) (Oral)   SpO2 100%   Physical Exam  Constitutional: He is oriented to person, place, and time. He appears well-developed and well-nourished.  Non-toxic appearance. No distress.  HENT:  Head: Normocephalic and atraumatic.  Eyes: Pupils are equal, round, and reactive to light. Conjunctivae, EOM and lids are normal.  Neck: Normal range of motion. Neck supple. No tracheal deviation present. No thyroid mass present.  Cardiovascular: Normal rate, regular rhythm and normal heart sounds. Exam reveals no gallop.  No murmur  heard. Pulmonary/Chest: Effort normal and breath sounds normal. No stridor. No respiratory distress. He has no decreased breath sounds. He has no wheezes. He has no rhonchi. He has no rales.  Abdominal: Soft. Normal appearance and bowel sounds are normal. He exhibits no distension. There is no tenderness. There is no rebound and no CVA tenderness.  Musculoskeletal: Normal range of motion. He exhibits no edema or tenderness.       Back:  Neurological: He is alert and oriented to person, place, and time. He has normal strength. No cranial nerve deficit or sensory deficit. GCS eye subscore is 4. GCS verbal subscore is 5. GCS motor subscore is 6.  Skin: Skin is warm and dry. No abrasion and no rash noted.  Psychiatric: He has a normal mood and affect. His speech is normal and behavior is normal.  Nursing note and vitals reviewed.    ED Treatments / Results  Labs (all labs ordered are listed, but only abnormal results are displayed) Labs Reviewed - No data to display  EKG None  Radiology No results found.  Procedures Procedures (including critical care time)  Medications Ordered in ED Medications - No data to display   Initial Impression / Assessment and Plan / ED Course  I have reviewed the triage vital signs and the nursing notes.  Pertinent labs & imaging results that were available during my care of the patient were reviewed by me and considered in my medical decision making (see chart for details).     Patient with pain at the coccyx without evidence of infection at this time.  Suspect musculoskeletal etiology.  Will prescribe short course of analgesics as well as muscle relaxants.  Final Clinical Impressions(s) / ED Diagnoses   Final diagnoses:  None    ED Discharge Orders    None       Lorre Nick, MD 12/09/17 [phone]

## 2017-12-09 NOTE — ED Triage Notes (Signed)
Pt complains of tailbone pain since yesterday evening. Pt denies injury. Pt denies other pain. Pt is hypertensive in triage, states she has not taken her BP medication.

## 2017-12-29 ENCOUNTER — Other Ambulatory Visit: Payer: Self-pay

## 2018-01-12 ENCOUNTER — Ambulatory Visit: Payer: Self-pay | Admitting: Internal Medicine

## 2018-01-12 ENCOUNTER — Ambulatory Visit: Payer: Self-pay

## 2018-01-24 ENCOUNTER — Other Ambulatory Visit: Payer: Self-pay | Admitting: Internal Medicine

## 2018-01-24 DIAGNOSIS — B029 Zoster without complications: Secondary | ICD-10-CM

## 2018-02-08 ENCOUNTER — Other Ambulatory Visit: Payer: Self-pay | Admitting: Internal Medicine

## 2018-02-08 DIAGNOSIS — B029 Zoster without complications: Secondary | ICD-10-CM

## 2018-02-10 ENCOUNTER — Other Ambulatory Visit: Payer: Self-pay | Admitting: Internal Medicine

## 2018-02-10 DIAGNOSIS — I10 Essential (primary) hypertension: Secondary | ICD-10-CM

## 2018-02-10 DIAGNOSIS — F4323 Adjustment disorder with mixed anxiety and depressed mood: Secondary | ICD-10-CM

## 2018-02-10 DIAGNOSIS — B20 Human immunodeficiency virus [HIV] disease: Secondary | ICD-10-CM

## 2018-02-17 ENCOUNTER — Other Ambulatory Visit: Payer: Self-pay | Admitting: Internal Medicine

## 2018-02-17 DIAGNOSIS — F4323 Adjustment disorder with mixed anxiety and depressed mood: Secondary | ICD-10-CM

## 2018-02-17 DIAGNOSIS — B029 Zoster without complications: Secondary | ICD-10-CM

## 2018-02-18 ENCOUNTER — Telehealth: Payer: Self-pay | Admitting: *Deleted

## 2018-02-18 NOTE — Telephone Encounter (Signed)
Received refill request for paxil, valtrex. These have been previously denied, as patient is overdue for an appointment and has had multiple reachouts to schedule him. RN called phone number listed, but went to a voicemail belonging to someone named "margaret".  RN left message asking [name] to call his doctor's office.  RN denied reflls with message asking patient to call. Andree CossHowell, Devaun Hernandez M, RN

## 2018-02-22 ENCOUNTER — Telehealth: Payer: Self-pay | Admitting: *Deleted

## 2018-02-22 DIAGNOSIS — I10 Essential (primary) hypertension: Secondary | ICD-10-CM

## 2018-02-22 DIAGNOSIS — B20 Human immunodeficiency virus [HIV] disease: Secondary | ICD-10-CM

## 2018-02-22 DIAGNOSIS — F4323 Adjustment disorder with mixed anxiety and depressed mood: Secondary | ICD-10-CM

## 2018-02-22 DIAGNOSIS — B029 Zoster without complications: Secondary | ICD-10-CM

## 2018-02-22 MED ORDER — PAROXETINE HCL 10 MG PO TABS: 10.0000 mg | ORAL_TABLET | Freq: Every day | ORAL | 0 refills | Status: DC

## 2018-02-22 MED ORDER — HYDROCHLOROTHIAZIDE 25 MG PO TABS: ORAL_TABLET | ORAL | 0 refills | Status: DC

## 2018-02-22 MED ORDER — EMTRICITABINE-TENOFOVIR AF 200-25 MG PO TABS: 1.0000 | ORAL_TABLET | Freq: Every day | ORAL | 0 refills | Status: DC

## 2018-02-22 MED ORDER — DOLUTEGRAVIR SODIUM 50 MG PO TABS: 50.0000 mg | ORAL_TABLET | Freq: Every day | ORAL | 0 refills | Status: DC

## 2018-02-22 MED ORDER — VALACYCLOVIR HCL 500 MG PO TABS: 500.0000 mg | ORAL_TABLET | Freq: Two times a day (BID) | ORAL | 0 refills | Status: DC

## 2018-02-22 NOTE — Addendum Note (Signed)
Addended by: Andree CossHOWELL, Marshawn Normoyle M on: 02/22/2018 02:59 PM   Modules accepted: Orders

## 2018-02-22 NOTE — Telephone Encounter (Signed)
RN returned call as requested, again went to voicemail belonging to "[place]aret". RN left message asking for [name] to call back and let us know which prescriptions he needs filled. Andree CossHowell, Fatuma Dowers M, RN

## 2018-02-22 NOTE — Telephone Encounter (Signed)
-----   Message from AndersonJohaura Celedonio sent at 02/22/2018 11:11 AM EDT ----- Contact: [phone] Prescription refill request

## 2018-02-23 ENCOUNTER — Other Ambulatory Visit: Payer: Self-pay

## 2018-03-16 ENCOUNTER — Telehealth: Payer: Self-pay | Admitting: *Deleted

## 2018-03-16 ENCOUNTER — Encounter: Payer: Self-pay | Admitting: Internal Medicine

## 2018-03-16 NOTE — Telephone Encounter (Signed)
Patient no-showed today's appointment. RN attempted to call to reschedule/notify to renew ADAP/RW, but mailbox was full.  No answer at any phone number listed. Will send to bridge counseling. Andree Coss, RN

## 2018-03-21 ENCOUNTER — Telehealth: Payer: Self-pay

## 2018-03-21 ENCOUNTER — Other Ambulatory Visit: Payer: Self-pay | Admitting: Internal Medicine

## 2018-03-21 DIAGNOSIS — I10 Essential (primary) hypertension: Secondary | ICD-10-CM

## 2018-03-21 DIAGNOSIS — F4323 Adjustment disorder with mixed anxiety and depressed mood: Secondary | ICD-10-CM

## 2018-03-21 DIAGNOSIS — B20 Human immunodeficiency virus [HIV] disease: Secondary | ICD-10-CM

## 2018-03-21 DIAGNOSIS — B029 Zoster without complications: Secondary | ICD-10-CM

## 2018-03-21 NOTE — Telephone Encounter (Signed)
Called [facility] to cancel a refill that was sent in error. Spoke with pharmacist who was able to take a verbal order to cancel refill. Lorenso Courier, [place]

## 2018-03-21 NOTE — Telephone Encounter (Signed)
Attempted to call patient to schedule an appointment to follow- up with our office. Patient has not been in clinic since 03/31/17. Unable to leave a message during my call due to call being dropped.  Jeremiah Stevens, [place]

## 2018-04-02 ENCOUNTER — Telehealth: Payer: Self-pay | Admitting: Internal Medicine

## 2018-04-02 NOTE — Telephone Encounter (Signed)
Patient paged me as the on call physician this am.  Did not answer when number called back.   Gardiner Barefootobert W Dandrea Widdowson, MD

## 2018-04-04 ENCOUNTER — Other Ambulatory Visit: Payer: Self-pay | Admitting: *Deleted

## 2018-04-04 DIAGNOSIS — F4323 Adjustment disorder with mixed anxiety and depressed mood: Secondary | ICD-10-CM

## 2018-04-04 DIAGNOSIS — B029 Zoster without complications: Secondary | ICD-10-CM

## 2018-04-04 MED ORDER — PAROXETINE HCL 10 MG PO TABS: 10.0000 mg | ORAL_TABLET | Freq: Every day | ORAL | 1 refills | Status: DC

## 2018-04-04 MED ORDER — VALACYCLOVIR HCL 500 MG PO TABS: ORAL_TABLET | ORAL | 1 refills | Status: DC

## 2018-04-05 ENCOUNTER — Encounter: Payer: Self-pay | Admitting: Internal Medicine

## 2018-04-07 ENCOUNTER — Other Ambulatory Visit: Payer: Self-pay

## 2018-04-17 ENCOUNTER — Other Ambulatory Visit: Payer: Self-pay | Admitting: Internal Medicine

## 2018-04-17 DIAGNOSIS — B029 Zoster without complications: Secondary | ICD-10-CM

## 2018-04-17 DIAGNOSIS — I10 Essential (primary) hypertension: Secondary | ICD-10-CM

## 2018-04-17 DIAGNOSIS — B20 Human immunodeficiency virus [HIV] disease: Secondary | ICD-10-CM

## 2018-05-23 ENCOUNTER — Other Ambulatory Visit: Payer: Self-pay | Admitting: Internal Medicine

## 2018-05-23 DIAGNOSIS — F4323 Adjustment disorder with mixed anxiety and depressed mood: Secondary | ICD-10-CM

## 2018-05-24 ENCOUNTER — Encounter: Payer: Self-pay | Admitting: Internal Medicine

## 2018-05-26 ENCOUNTER — Other Ambulatory Visit: Payer: Self-pay | Admitting: Internal Medicine

## 2018-05-26 DIAGNOSIS — B20 Human immunodeficiency virus [HIV] disease: Secondary | ICD-10-CM

## 2018-05-26 DIAGNOSIS — B029 Zoster without complications: Secondary | ICD-10-CM

## 2018-06-21 ENCOUNTER — Other Ambulatory Visit: Payer: Self-pay | Admitting: Internal Medicine

## 2018-06-21 DIAGNOSIS — B20 Human immunodeficiency virus [HIV] disease: Secondary | ICD-10-CM

## 2018-06-22 ENCOUNTER — Other Ambulatory Visit: Payer: Self-pay | Admitting: Internal Medicine

## 2018-06-22 DIAGNOSIS — F4323 Adjustment disorder with mixed anxiety and depressed mood: Secondary | ICD-10-CM

## 2018-06-22 DIAGNOSIS — B20 Human immunodeficiency virus [HIV] disease: Secondary | ICD-10-CM

## 2018-06-29 ENCOUNTER — Ambulatory Visit (INDEPENDENT_AMBULATORY_CARE_PROVIDER_SITE_OTHER): Payer: Self-pay | Admitting: Infectious Diseases

## 2018-06-29 ENCOUNTER — Other Ambulatory Visit: Payer: Self-pay

## 2018-06-29 DIAGNOSIS — B20 Human immunodeficiency virus [HIV] disease: Secondary | ICD-10-CM

## 2018-06-29 DIAGNOSIS — Z79899 Other long term (current) drug therapy: Secondary | ICD-10-CM

## 2018-06-29 DIAGNOSIS — I10 Essential (primary) hypertension: Secondary | ICD-10-CM

## 2018-06-29 DIAGNOSIS — B009 Herpesviral infection, unspecified: Secondary | ICD-10-CM

## 2018-06-29 DIAGNOSIS — F4323 Adjustment disorder with mixed anxiety and depressed mood: Secondary | ICD-10-CM

## 2018-06-29 DIAGNOSIS — R4589 Other symptoms and signs involving emotional state: Secondary | ICD-10-CM

## 2018-06-29 DIAGNOSIS — B029 Zoster without complications: Secondary | ICD-10-CM

## 2018-06-29 LAB — CBC
HCT: 47.4 % (ref 38.5–50.0)
Hemoglobin: 15.8 g/dL (ref 13.2–17.1)
MCH: 29.2 pg (ref 27.0–33.0)
MCHC: 33.3 g/dL (ref 32.0–36.0)
MCV: 87.5 fL (ref 80.0–100.0)
MPV: 10.4 fL (ref 7.5–12.5)
Platelets: 266 10*3/uL (ref 140–400)
RBC: 5.42 10*6/uL (ref 4.20–5.80)
RDW: 14.4 % (ref 11.0–15.0)
WBC: 5.7 10*3/uL (ref 3.8–10.8)

## 2018-06-29 MED ORDER — HYDROCHLOROTHIAZIDE 25 MG PO TABS: ORAL_TABLET | ORAL | 3 refills | Status: DC

## 2018-06-29 MED ORDER — AMLODIPINE BESYLATE 10 MG PO TABS: 10.0000 mg | ORAL_TABLET | Freq: Every day | ORAL | 3 refills | Status: DC

## 2018-06-29 MED ORDER — PAROXETINE HCL 10 MG PO TABS: 10.0000 mg | ORAL_TABLET | Freq: Every day | ORAL | 3 refills | Status: DC

## 2018-06-29 MED ORDER — DOLUTEGRAVIR SODIUM 50 MG PO TABS: 50.0000 mg | ORAL_TABLET | Freq: Every day | ORAL | 3 refills | Status: DC

## 2018-06-29 MED ORDER — EMTRICITABINE-TENOFOVIR AF 200-25 MG PO TABS: 1.0000 | ORAL_TABLET | Freq: Every day | ORAL | 3 refills | Status: DC

## 2018-06-29 MED ORDER — VALACYCLOVIR HCL 500 MG PO TABS: ORAL_TABLET | ORAL | 3 refills | Status: DC

## 2018-06-29 NOTE — Progress Notes (Signed)
Missed about 2 weeks of medications

## 2018-06-29 NOTE — Patient Instructions (Signed)
Refills have been sent in to the pharmacy  Please come back next week to get your flu shot and urine sample.   Please come back in 2 months to see Dr. Drue SecondSnider

## 2018-06-29 NOTE — Progress Notes (Signed)
Name: Jeremiah Stevens  DOB: [birth year] MRN: [id number] PCP: Judyann MunsonSnider, Cynthia, MD    Patient Active Problem List   Diagnosis Date Noted  . Non-suicidal depressed mood 07/01/2018  . Migraine 08/02/2013  . 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 Human immunodeficiency virus (HIV) disease 10/27/2011  . HTN (hypertension) 10/27/2011  . Hypertriglyceridemia 10/27/2011  . Herpes 10/27/2011     Subjective:  CC:  Jeremiah Grippeony T Keelan is a 42 y.o. transfemale with HIV disease. Has not been seen since September 2018 and usually works with Dr. Drue SecondSnider.   She has been out of medications for 3 weeks now and needs refills on her HIV medications, antidepressant and blood pressure medication. She is crying in clinic and talking about her mother's death 25 years ago and how she raised her younger siblings and how this time of year is very stressful. No changes to her health history. She is not sexually active currently.   Depression screen PHQ 2/9 03/31/2017  Decreased Interest 0  Down, Depressed, Hopeless 0  PHQ - 2 Score 0    Review of Systems  Constitutional: Negative for chills, fever, malaise/fatigue and weight loss.  HENT: Negative for sore throat.        No dental problems  Respiratory: Negative for cough and sputum production.   Cardiovascular: Negative for chest pain and leg swelling.  Gastrointestinal: Negative for abdominal pain, diarrhea and vomiting.  Genitourinary: Negative for dysuria and flank pain.  Musculoskeletal: Negative for joint pain, myalgias and neck pain.  Skin: Negative for rash.  Neurological: Negative for dizziness, tingling and headaches.  Psychiatric/Behavioral: Negative for depression and substance abuse. The patient is not nervous/anxious and does not have insomnia.        Stressful    Past Medical History:  Diagnosis Date  . Heartburn    occasional; OTC as needed  . Herpes genitalis in men   . HIV disease (HCC)   . Hyperlipidemia   .  Hypertension    under control with med., has been on med. x 1 yr.  . Lateral malleolar fracture 09/02/2013   left  . Migraines   . Tear of deltoid ligament of left ankle 09/02/2013    Outpatient Medications Prior to Visit  Medication Sig Dispense Refill  . HYDROcodone-acetaminophen (NORCO/VICODIN) 5-325 MG tablet Take 2 tablets by mouth every 4 (four) hours as needed. 10 tablet 0  . methocarbamol (ROBAXIN-750) 750 MG tablet Take 1 tablet (750 mg total) by mouth 4 (four) times daily. 30 tablet 0  . ranitidine (ZANTAC) 150 MG tablet Take 150 mg by mouth 2 (two) times daily. Over-the-counter product    . amLODipine (NORVASC) 10 MG tablet Take 1 tablet (10 mg total) by mouth daily. 30 tablet 11  . emtricitabine-tenofovir AF (DESCOVY) 200-25 MG tablet Take 1 tablet by mouth daily. To take with 1 tab of tivicay 30 tablet 0  . hydrochlorothiazide (HYDRODIURIL) 25 MG tablet TAKE 1 TABLET(25 MG) BY MOUTH DAILY 30 tablet 0  . PARoxetine (PAXIL) 10 MG tablet TAKE 1 TABLET(10 MG) BY MOUTH DAILY 30 tablet 0  . TIVICAY 50 MG tablet TAKE 1 TABLET(50 MG) BY MOUTH DAILY 30 tablet 0  . traMADol (ULTRAM) 50 MG tablet Take 1 tablet (50 mg total) by mouth every 6 (six) hours as needed. 30 tablet 0  . valACYclovir (VALTREX) 500 MG tablet TAKE 1 TABLET(500 MG) BY MOUTH TWICE DAILY 60 tablet 1   No facility-administered medications prior to visit.  Allergies  Allergen Reactions  . Peanut-Containing Drug Products Other (See Comments)    WALNUTS - SORES ON TONGUE  . Sustiva [Efavirenz] Rash    Social History   Tobacco Use  . Smoking status: Current Every Day Smoker    Packs/day: 0.50    Years: 15.00    Pack years: 7.50    Types: Cigarettes  . Smokeless tobacco: Never Used  Substance Use Topics  . Alcohol use: No  . Drug use: Yes    Frequency: 7.0 times per week    Types: Marijuana    Comment: Daily     Family History  Problem Relation Age of Onset  . Hypertension Mother     Social  History   Substance and Sexual Activity  Sexual Activity Not Currently  . Partners: Male   Comment: Last sexual intercourse 2006     Objective:  There were no vitals filed for this visit. There is no height or weight on file to calculate BMI.  Physical Exam Vitals signs and nursing note reviewed.  Constitutional:      Appearance: He is well-developed.  HENT:     Mouth/Throat:     Dentition: Normal dentition. No dental abscesses.  Cardiovascular:     Rate and Rhythm: Normal rate and regular rhythm.     Heart sounds: Normal heart sounds.  Pulmonary:     Effort: Pulmonary effort is normal.     Breath sounds: Normal breath sounds.  Abdominal:     General: There is no distension.     Palpations: Abdomen is soft.     Tenderness: There is no abdominal tenderness.  Lymphadenopathy:     Cervical: No cervical adenopathy.  Skin:    General: Skin is warm and dry.     Findings: No rash.  Neurological:     Mental Status: He is alert and oriented to person, place, and time.  Psychiatric:        Mood and Affect: Mood is elated. Affect is tearful.        Speech: Speech is rapid and pressured and tangential.        Judgment: Judgment is impulsive.     Lab Results Lab Results  Component Value Date   WBC 5.7 06/29/2018   HGB 15.8 06/29/2018   HCT 47.4 06/29/2018   MCV 87.5 06/29/2018   PLT 266 06/29/2018    Lab Results  Component Value Date   CREATININE 1.22 06/29/2018   BUN 12 06/29/2018   NA 140 06/29/2018   K 3.8 06/29/2018   CL 104 06/29/2018   CO2 32 06/29/2018    Lab Results  Component Value Date   ALT 23 06/29/2018   AST 17 06/29/2018   ALKPHOS 71 09/22/2016   BILITOT 0.4 06/29/2018    Lab Results  Component Value Date   CHOL 203 (H) 06/29/2018   HDL 40 (L) 06/29/2018   LDLCALC 132 (H) 06/29/2018   TRIG 177 (H) 06/29/2018   CHOLHDL 5.1 (H) 06/29/2018   HIV 1 RNA Quant (copies/mL)  Date Value  06/29/2018 <20 NOT DETECTED  03/31/2017 <20 NOT DETECTED    09/22/2016 <20 NOT DETECTED   CD4 T Cell Abs (/uL)  Date Value  06/29/2018 490  03/31/2017 510  09/22/2016 420     Assessment & Plan:   Problem List Items Addressed This Visit      Unprioritized   Human immunodeficiency virus (HIV) disease (Chronic)    Will provide refills on her Tivicay + Descovy  today and check labs today. She will follow up in about 2 months to meet with Dr. Drue Second so we can follow up on her mood lability to try to encourage psychiatric care.       Relevant Medications   valACYclovir (VALTREX) 500 MG tablet   emtricitabine-tenofovir AF (DESCOVY) 200-25 MG tablet   dolutegravir (TIVICAY) 50 MG tablet   HTN (hypertension)    Refill hctz and amlodipine       Relevant Medications   amLODipine (NORVASC) 10 MG tablet   hydrochlorothiazide (HYDRODIURIL) 25 MG tablet   Herpes    Refill valtrex 500 mg bid for suppression       Relevant Medications   valACYclovir (VALTREX) 500 MG tablet   emtricitabine-tenofovir AF (DESCOVY) 200-25 MG tablet   dolutegravir (TIVICAY) 50 MG tablet   Non-suicidal depressed mood    Pressured, rapid and tangential speech today. Difficult to reorient and re-focus her today. She is very stressed with the holidays and mother's death. I asked her to please consider counseling therapy with our team or [facility]. She does not feel that she needs this. Will refill paxil and have her come back in 2 months.        Other Visit Diagnoses    Encounter for long-term (current) use of medications       Herpes zoster       Relevant Medications   valACYclovir (VALTREX) 500 MG tablet   emtricitabine-tenofovir AF (DESCOVY) 200-25 MG tablet   dolutegravir (TIVICAY) 50 MG tablet   Essential hypertension, benign       Relevant Medications   amLODipine (NORVASC) 10 MG tablet   hydrochlorothiazide (HYDRODIURIL) 25 MG tablet   Adjustment disorder with mixed anxiety and depressed mood       Relevant Medications   PARoxetine (PAXIL) 10 MG tablet    HIV disease (HCC)       Relevant Medications   valACYclovir (VALTREX) 500 MG tablet   emtricitabine-tenofovir AF (DESCOVY) 200-25 MG tablet   dolutegravir (TIVICAY) 50 MG tablet   Other Relevant Orders   CBC (Completed)   RPR      Rexene Alberts, MSN, NP-C [facility] Pager: [phone] Office: [phone]  07/01/18  5:08 PM

## 2018-06-30 LAB — T-HELPER CELL (CD4) - (RCID CLINIC ONLY)
CD4 % Helper T Cell: 22 % — ABNORMAL LOW (ref 33–55)
CD4 T Cell Abs: 490 /uL (ref 400–2700)

## 2018-07-01 DIAGNOSIS — R4589 Other symptoms and signs involving emotional state: Secondary | ICD-10-CM | POA: Insufficient documentation

## 2018-07-01 LAB — COMPREHENSIVE METABOLIC PANEL
AG RATIO: 1.1 (calc) (ref 1.0–2.5)
ALT: 23 U/L (ref 9–46)
AST: 17 U/L (ref 10–40)
Albumin: 4.1 g/dL (ref 3.6–5.1)
Alkaline phosphatase (APISO): 106 U/L (ref 40–115)
BUN: 12 mg/dL (ref 7–25)
CHLORIDE: 104 mmol/L (ref 98–110)
CO2: 32 mmol/L (ref 20–32)
Calcium: 9.7 mg/dL (ref 8.6–10.3)
Creat: 1.22 mg/dL (ref 0.60–1.35)
Globulin: 3.7 g/dL (calc) (ref 1.9–3.7)
Glucose, Bld: 66 mg/dL (ref 65–99)
Potassium: 3.8 mmol/L (ref 3.5–5.3)
Sodium: 140 mmol/L (ref 135–146)
Total Bilirubin: 0.4 mg/dL (ref 0.2–1.2)
Total Protein: 7.8 g/dL (ref 6.1–8.1)

## 2018-07-01 LAB — LIPID PANEL
CHOLESTEROL: 203 mg/dL — AB (ref <200)
HDL: 40 mg/dL — AB (ref >40)
LDL Cholesterol (Calc): 132 mg/dL (calc) — ABNORMAL HIGH
Non-HDL Cholesterol (Calc): 163 mg/dL (calc) — ABNORMAL HIGH (ref <130)
Total CHOL/HDL Ratio: 5.1 (calc) — ABNORMAL HIGH (ref <5.0)
Triglycerides: 177 mg/dL — ABNORMAL HIGH (ref <150)

## 2018-07-01 LAB — HIV-1 RNA QUANT-NO REFLEX-BLD
HIV 1 RNA Quant: <20 copies/mL
HIV-1 RNA Quant, Log: <1.3 Log copies/mL

## 2018-07-01 NOTE — Assessment & Plan Note (Signed)
Will provide refills on her Tivicay + Descovy today and check labs today. She will follow up in about 2 months to meet with Dr. Drue SecondSnider so we can follow up on her mood lability to try to encourage psychiatric care.

## 2018-07-01 NOTE — Assessment & Plan Note (Signed)
Pressured, rapid and tangential speech today. Difficult to reorient and re-focus her today. She is very stressed with the holidays and mother's death. I asked her to please consider counseling therapy with our team or [facility]. She does not feel that she needs this. Will refill paxil and have her come back in 2 months.

## 2018-07-01 NOTE — Assessment & Plan Note (Signed)
Refill valtrex 500 mg bid for suppression

## 2018-07-01 NOTE — Assessment & Plan Note (Signed)
Refill hctz and amlodipine

## 2018-09-12 ENCOUNTER — Ambulatory Visit: Payer: Self-pay | Admitting: Internal Medicine

## 2018-10-23 ENCOUNTER — Emergency Department (HOSPITAL_COMMUNITY)
Admission: EM | Admit: 2018-10-23 | Discharge: 2018-10-23 | Disposition: A | Discharge status: Home / Self Care | Payer: Self-pay | Attending: Emergency Medicine | Admitting: Emergency Medicine

## 2018-10-23 ENCOUNTER — Other Ambulatory Visit: Payer: Self-pay

## 2018-10-23 ENCOUNTER — Encounter (HOSPITAL_COMMUNITY): Payer: Self-pay

## 2018-10-23 DIAGNOSIS — F17210 Nicotine dependence, cigarettes, uncomplicated: Secondary | ICD-10-CM | POA: Insufficient documentation

## 2018-10-23 DIAGNOSIS — I10 Essential (primary) hypertension: Secondary | ICD-10-CM | POA: Insufficient documentation

## 2018-10-23 DIAGNOSIS — B20 Human immunodeficiency virus [HIV] disease: Secondary | ICD-10-CM | POA: Insufficient documentation

## 2018-10-23 DIAGNOSIS — Z79899 Other long term (current) drug therapy: Secondary | ICD-10-CM | POA: Insufficient documentation

## 2018-10-23 DIAGNOSIS — M79642 Pain in left hand: Secondary | ICD-10-CM | POA: Insufficient documentation

## 2018-10-23 DIAGNOSIS — Z91010 Allergy to peanuts: Secondary | ICD-10-CM | POA: Insufficient documentation

## 2018-10-23 NOTE — ED Provider Notes (Signed)
[facility]-EMERGENCY DEPT Provider Note   CSN: [id number] Arrival date & time: 10/23/18  2103    History   Chief Complaint Chief Complaint  Patient presents with  . Arm Pain    HPI PRANAV ANDERLE is a 43 y.o. male.     HPI   Patient is a 43 year old male with a history of GERD, HIV, hyperlipidemia, hypertension, migraines, who presents emergency department today for evaluation of left arm pain.  Patient states that 20 minutes prior to arrival she was crocheting in the car and went to pick up her phone and experienced pain to the dorsal aspect of her left hand.  The pain radiated up her left forearm.  Pain is dull and achy.  It is described as mild now.  She denies any numbness, tingling, weakness to the left arm.  No left lower extremity weakness numbness or other neurologic complaints.  Denies any redness, swelling or recent trauma to the left upper extremity.  No fevers.  She has a history of carpal tunnel syndrome and normally wears a splint in the left hand.  She states that she has had similar symptoms in the past however today her hand pain lasted longer than usual.  States that she was concerned for a stroke which is why she came to the ED. No neck pain or pain radiating down the LUE. No CP or SOB.  Past Medical History:  Diagnosis Date  . Heartburn    occasional; OTC as needed  . Herpes genitalis in men   . HIV disease (HCC)   . Hyperlipidemia   . Hypertension    under control with med., has been on med. x 1 yr.  . Lateral malleolar fracture 09/02/2013   left  . Migraines   . Tear of deltoid ligament of left ankle 09/02/2013    Patient Active Problem List   Diagnosis Date Noted  . Non-suicidal depressed mood 07/01/2018  . Migraine 08/02/2013  . 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 Human immunodeficiency virus (HIV) disease 10/27/2011  . HTN (hypertension) 10/27/2011  . Hypertriglyceridemia 10/27/2011  . Herpes 10/27/2011     Past Surgical History:  Procedure Laterality Date  . NO PAST SURGERIES    . ORIF ANKLE FRACTURE Left 09/13/2013   Procedure: OPEN REDUCTION INTERNAL FIXATION (ORIF) LEFT LATERAL MALLEOLUS ANKLE FRACTURE ;  Surgeon: Dannielle Huh, MD;  Location: [facility];  Service: Orthopedics;  Laterality: Left;        Home Medications    Prior to Admission medications   Medication Sig Start Date End Date Taking? Authorizing Provider  amLODipine (NORVASC) 10 MG tablet Take 1 tablet (10 mg total) by mouth daily. 06/29/18   Blanchard Kelch, NP  dolutegravir (TIVICAY) 50 MG tablet Take 1 tablet (50 mg total) by mouth daily. 06/29/18   Blanchard Kelch, NP  emtricitabine-tenofovir AF (DESCOVY) 200-25 MG tablet Take 1 tablet by mouth daily. To take with 1 tab of tivicay 06/29/18   Blanchard Kelch, NP  hydrochlorothiazide (HYDRODIURIL) 25 MG tablet One tablet once a day in the morning 06/29/18   Blanchard Kelch, NP  HYDROcodone-acetaminophen (NORCO/VICODIN) 5-325 MG tablet Take 2 tablets by mouth every 4 (four) hours as needed. 12/09/17   Lorre Nick, MD  methocarbamol (ROBAXIN-750) 750 MG tablet Take 1 tablet (750 mg total) by mouth 4 (four) times daily. 12/09/17   Lorre Nick, MD  PARoxetine (PAXIL) 10 MG tablet Take 1 tablet (10 mg total)  by mouth daily. 06/29/18   Blanchard Kelchixon, Stephanie N, NP  ranitidine (ZANTAC) 150 MG tablet Take 150 mg by mouth 2 (two) times daily. Over-the-counter product    [provider]  valACYclovir (VALTREX) 500 MG tablet TAKE 1 TABLET(500 MG) BY MOUTH TWICE DAILY 06/29/18   Blanchard Kelchixon, Stephanie N, NP    Family History Family History  Problem Relation Age of Onset  . Hypertension Mother     Social History Social History   Tobacco Use  . Smoking status: Current Every Day Smoker    Packs/day: 0.50    Years: 15.00    Pack years: 7.50    Types: Cigarettes  . Smokeless tobacco: Never Used  Substance Use Topics  . Alcohol use: No  . Drug use:  Yes    Frequency: 7.0 times per week    Types: Marijuana    Comment: Daily      Allergies   Peanut-containing drug products and Sustiva [efavirenz]   Review of Systems Review of Systems  Constitutional: Negative for fever.  Eyes: Negative for visual disturbance.  Respiratory: Negative for shortness of breath.   Cardiovascular: Negative for chest pain.  Gastrointestinal: Negative for abdominal pain and diarrhea.  Musculoskeletal:       Left hand pain  Skin: Negative for color change.  Neurological: Negative for dizziness, weakness, light-headedness, numbness and headaches.    Physical Exam Updated Vital Signs BP (!) 151/108   Pulse 96   Temp 97.8 F (36.6 C) (Oral)   Resp 18   Ht 5\' 9"  (1.753 m)   Wt (!) 136.8 kg   SpO2 99%   BMI 44.54 kg/m   Physical Exam Vitals signs and nursing note reviewed.  Constitutional:      Appearance: He is well-developed.  HENT:     Head: Normocephalic and atraumatic.  Eyes:     Conjunctiva/sclera: Conjunctivae normal.  Neck:     Musculoskeletal: Neck supple.  Cardiovascular:     Rate and Rhythm: Normal rate and regular rhythm.     Heart sounds: Normal heart sounds. No murmur.  Pulmonary:     Effort: Pulmonary effort is normal. No respiratory distress.     Breath sounds: Normal breath sounds. No stridor. No wheezing, rhonchi or rales.  Abdominal:     Palpations: Abdomen is soft.  Musculoskeletal:     Comments: No TTP to the hand, forearm, or upper arm. No swelling, edema, or erythema to the LUE. Radial/ulnar pulses are intact bilat. Full and painless ROM of the LUE.    Skin:    General: Skin is warm and dry.  Neurological:     Mental Status: He is alert.     Comments: Mental Status:  Alert, thought content appropriate, able to give a coherent history. Speech fluent without evidence of aphasia. Able to follow 2 step commands without difficulty.  Cranial Nerves:  II: pupils equal, round, reactive to light III,IV, VI: ptosis  not present, extra-ocular motions intact bilaterally  V,VII: smile symmetric, facial light touch sensation equal VIII: hearing grossly normal to voice  X: uvula elevates symmetrically  XI: bilateral shoulder shrug symmetric and strong XII: midline tongue extension without fassiculations Motor:  Normal tone. 5/5 strength of BUE and BLE major muscle groups including strong and equal grip strength and dorsiflexion/plantar flexion Sensory: light touch normal in all extremities.       ED Treatments / Results  Labs (all labs ordered are listed, but only abnormal results are displayed) Labs Reviewed - No data  to display  EKG None  Radiology No results found.  Procedures Procedures (including critical care time)  Medications Ordered in ED Medications - No data to display   Initial Impression / Assessment and Plan / ED Course  I have reviewed the triage vital signs and the nursing notes.  Pertinent labs & imaging results that were available during my care of the patient were reviewed by me and considered in my medical decision making (see chart for details).     Final Clinical Impressions(s) / ED Diagnoses   Final diagnoses:  Left hand pain   Pt presenting with left arm pain starting 20 min pta. States that she has had similar pain in the past however it lasted longer than normal and she became concerned for stroke. She has no neurologic complaints and her neuro exam is normal. She has no ttp on exam. Exam does not suggest DVT in LUE. No evidence of cellulitis. I suspect her pain is MSK related. I advised antiinflammatories and RICE protocol. Advised close pcp f/u and to return to the ED if new or worsening sxs develop. She voices understanding of the plan and reasons to return. All questions answered. Pt stable for discharge.  ED Discharge Orders    None       Rayne Du 10/23/18 2211    Little, Ambrose Finland, MD 10/23/18 2217

## 2018-10-23 NOTE — ED Notes (Signed)
Pt states she did not take her blood pressure medicine today. BP 159/99. Patient not symptomatic

## 2018-10-23 NOTE — ED Triage Notes (Signed)
Pt stating pain started as left hand pain and moved up mid arm roughly 20 minutes ago. Denies any other symptoms, states that he read this could be a symptoms of a stroke and got scared. States right now the pain is just dull and achy.

## 2018-10-23 NOTE — Discharge Instructions (Signed)
Please follow up with your primary care provider within 5-7 days for re-evaluation of your symptoms. If you do not have a primary care provider, information for a healthcare clinic has been provided for you to make arrangements for follow up care. Please return to the emergency department for any new or worsening symptoms. ° °

## 2018-11-14 ENCOUNTER — Other Ambulatory Visit: Payer: Self-pay | Admitting: Infectious Diseases

## 2018-11-14 DIAGNOSIS — F4323 Adjustment disorder with mixed anxiety and depressed mood: Secondary | ICD-10-CM

## 2018-11-14 DIAGNOSIS — B20 Human immunodeficiency virus [HIV] disease: Secondary | ICD-10-CM

## 2018-11-14 DIAGNOSIS — B029 Zoster without complications: Secondary | ICD-10-CM

## 2018-11-14 DIAGNOSIS — I10 Essential (primary) hypertension: Secondary | ICD-10-CM

## 2019-01-19 ENCOUNTER — Other Ambulatory Visit: Payer: Medicaid Other

## 2019-01-24 ENCOUNTER — Ambulatory Visit: Payer: Self-pay

## 2019-01-24 ENCOUNTER — Other Ambulatory Visit: Payer: Self-pay

## 2019-01-24 ENCOUNTER — Encounter: Payer: Self-pay | Admitting: Internal Medicine

## 2019-01-24 ENCOUNTER — Other Ambulatory Visit: Payer: Self-pay | Admitting: *Deleted

## 2019-01-24 DIAGNOSIS — Z113 Encounter for screening for infections with a predominantly sexual mode of transmission: Secondary | ICD-10-CM

## 2019-01-24 DIAGNOSIS — B20 Human immunodeficiency virus [HIV] disease: Secondary | ICD-10-CM

## 2019-01-24 DIAGNOSIS — F4323 Adjustment disorder with mixed anxiety and depressed mood: Secondary | ICD-10-CM

## 2019-01-24 MED ORDER — PAROXETINE HCL 10 MG PO TABS: ORAL_TABLET | ORAL | 0 refills | Status: DC

## 2019-01-25 ENCOUNTER — Ambulatory Visit: Payer: Medicaid Other

## 2019-01-25 LAB — T-HELPER CELL (CD4) - (RCID CLINIC ONLY)
CD4 % Helper T Cell: 29 % — ABNORMAL LOW (ref 33–65)
CD4 T Cell Abs: 550 /uL (ref 400–1790)

## 2019-01-28 LAB — CBC WITH DIFFERENTIAL/PLATELET
Absolute Monocytes: 772 cells/uL (ref 200–950)
Basophils Absolute: 33 cells/uL (ref 0–200)
Basophils Relative: 0.5 %
Eosinophils Absolute: 132 cells/uL (ref 15–500)
Eosinophils Relative: 2 %
HCT: 40.8 % (ref 38.5–50.0)
Hemoglobin: 14.3 g/dL (ref 13.2–17.1)
Lymphs Abs: 2046 cells/uL (ref 850–3900)
MCH: 31.2 pg (ref 27.0–33.0)
MCHC: 35 g/dL (ref 32.0–36.0)
MCV: 89.1 fL (ref 80.0–100.0)
MPV: 10.1 fL (ref 7.5–12.5)
Monocytes Relative: 11.7 %
Neutro Abs: 3617 cells/uL (ref 1500–7800)
Neutrophils Relative %: 54.8 %
Platelets: 281 10*3/uL (ref 140–400)
RBC: 4.58 10*6/uL (ref 4.20–5.80)
RDW: 14.1 % (ref 11.0–15.0)
Total Lymphocyte: 31 %
WBC: 6.6 10*3/uL (ref 3.8–10.8)

## 2019-01-28 LAB — COMPLETE METABOLIC PANEL WITH GFR
AG Ratio: 1.1 (calc) (ref 1.0–2.5)
ALT: 28 U/L (ref 9–46)
AST: 16 U/L (ref 10–40)
Albumin: 3.8 g/dL (ref 3.6–5.1)
Alkaline phosphatase (APISO): 95 U/L (ref 36–130)
BUN: 12 mg/dL (ref 7–25)
CO2: 29 mmol/L (ref 20–32)
Calcium: 9.1 mg/dL (ref 8.6–10.3)
Chloride: 105 mmol/L (ref 98–110)
Creat: 1.21 mg/dL (ref 0.60–1.35)
GFR, Est African American: 85 mL/min/{1.73_m2} (ref >=60)
GFR, Est Non African American: 73 mL/min/{1.73_m2} (ref >=60)
Globulin: 3.6 g/dL (calc) (ref 1.9–3.7)
Glucose, Bld: 104 mg/dL — ABNORMAL HIGH (ref 65–99)
Potassium: 4.2 mmol/L (ref 3.5–5.3)
Sodium: 140 mmol/L (ref 135–146)
Total Bilirubin: 0.3 mg/dL (ref 0.2–1.2)
Total Protein: 7.4 g/dL (ref 6.1–8.1)

## 2019-01-28 LAB — HIV-1 RNA QUANT-NO REFLEX-BLD
HIV 1 RNA Quant: 132 copies/mL — ABNORMAL HIGH
HIV-1 RNA Quant, Log: 2.12 Log copies/mL — ABNORMAL HIGH

## 2019-01-28 LAB — RPR: RPR Ser Ql: NONREACTIVE

## 2019-02-06 ENCOUNTER — Encounter: Payer: Medicaid Other | Admitting: Internal Medicine

## 2019-02-28 ENCOUNTER — Other Ambulatory Visit: Payer: Self-pay | Admitting: Internal Medicine

## 2019-02-28 DIAGNOSIS — B20 Human immunodeficiency virus [HIV] disease: Secondary | ICD-10-CM

## 2019-02-28 DIAGNOSIS — F4323 Adjustment disorder with mixed anxiety and depressed mood: Secondary | ICD-10-CM

## 2019-02-28 DIAGNOSIS — B029 Zoster without complications: Secondary | ICD-10-CM

## 2019-02-28 DIAGNOSIS — I10 Essential (primary) hypertension: Secondary | ICD-10-CM

## 2019-03-23 ENCOUNTER — Telehealth: Payer: Self-pay

## 2019-03-23 NOTE — Telephone Encounter (Signed)
Received fax from [facility] a request of refill for  Diflorasone Diacetate Ointment 0.05%  Dispense: 360 Grams   Sig: Apply 2-3 gms to affected area(s) 3-4 times daily (1gm= 1 dime size).

## 2019-03-23 NOTE — Telephone Encounter (Signed)
Jeremiah Stevens is over due for office appointment.   Called [name] in attempt to schedule follow up appointment and labs. Left General message for [name] to give office a call back.     [name], [place]

## 2019-05-30 ENCOUNTER — Other Ambulatory Visit: Payer: Self-pay

## 2019-05-30 DIAGNOSIS — F4323 Adjustment disorder with mixed anxiety and depressed mood: Secondary | ICD-10-CM

## 2019-05-30 DIAGNOSIS — B20 Human immunodeficiency virus [HIV] disease: Secondary | ICD-10-CM

## 2019-05-30 MED ORDER — DESCOVY 200-25 MG PO TABS: ORAL_TABLET | ORAL | 0 refills | Status: DC

## 2019-05-30 MED ORDER — TIVICAY 50 MG PO TABS: ORAL_TABLET | ORAL | 0 refills | Status: DC

## 2019-05-30 MED ORDER — PAROXETINE HCL 10 MG PO TABS: ORAL_TABLET | ORAL | 0 refills | Status: DC

## 2019-06-21 ENCOUNTER — Encounter: Payer: Self-pay | Admitting: Internal Medicine

## 2019-06-21 ENCOUNTER — Ambulatory Visit (INDEPENDENT_AMBULATORY_CARE_PROVIDER_SITE_OTHER): Payer: Self-pay | Admitting: Internal Medicine

## 2019-06-21 ENCOUNTER — Other Ambulatory Visit: Payer: Self-pay

## 2019-06-21 VITALS — BP 178/97 | HR 97 | Temp 98.2°F | Ht 69.0 in | Wt 321.0 lb

## 2019-06-21 DIAGNOSIS — B20 Human immunodeficiency virus [HIV] disease: Secondary | ICD-10-CM

## 2019-06-21 DIAGNOSIS — Z23 Encounter for immunization: Secondary | ICD-10-CM

## 2019-06-21 DIAGNOSIS — F4321 Adjustment disorder with depressed mood: Secondary | ICD-10-CM

## 2019-06-21 MED ORDER — BICTEGRAVIR-EMTRICITAB-TENOFOV 50-200-25 MG PO TABS: 1.0000 | ORAL_TABLET | Freq: Every day | ORAL | 11 refills | Status: DC

## 2019-06-21 NOTE — Progress Notes (Signed)
[name], adult   DOB: 1976-03-01, 43 y.o.   MRN: [id number]  HPI 42yo TF with well controlled hiv disease, htn, obesity reports lose of close family member unexpectedly. Still saddened by overall grief. 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  Outpatient Encounter Medications as of 06/21/2019  Medication Sig  . amLODipine (NORVASC) 10 MG tablet TAKE 1 TABLET(10 MG) BY MOUTH DAILY  . dolutegravir (TIVICAY) 50 MG tablet TAKE 1 TABLET(50 MG) BY MOUTH DAILY  . emtricitabine-tenofovir AF (DESCOVY) 200-25 MG tablet TAKE 1 TABLET BY MOUTH DAILY, TAKE WITH 1 TABLET OF TIVICAY  . hydrochlorothiazide (HYDRODIURIL) 25 MG tablet TAKE 1 TABLET BY MOUTH EVERY DAY IN THE MORNING  . PARoxetine (PAXIL) 10 MG tablet Take 10 mg by mouth daily.  . valACYclovir (VALTREX) 500 MG tablet TAKE 1 TABLET(500 MG) BY MOUTH TWICE DAILY  . [DISCONTINUED] HYDROcodone-acetaminophen (NORCO/VICODIN) 5-325 MG tablet Take 2 tablets by mouth every 4 (four) hours as needed.  . [DISCONTINUED] methocarbamol (ROBAXIN-750) 750 MG tablet Take 1 tablet (750 mg total) by mouth 4 (four) times daily.  . [DISCONTINUED] PARoxetine (PAXIL) 10 MG tablet TAKE 1 TABLET(10 MG) BY MOUTH DAILY  . [DISCONTINUED] ranitidine (ZANTAC) 150 MG tablet Take 150 mg by mouth 2 (two) times daily. Over-the-counter product   No facility-administered encounter medications on file as of 06/21/2019.      Patient Active Problem List   Diagnosis Date Noted  . Non-suicidal depressed mood 07/01/2018  . Migraine 08/02/2013  . 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 Human immunodeficiency virus (HIV) disease 10/27/2011  . HTN (hypertension) 10/27/2011  . Hypertriglyceridemia 10/27/2011  . Herpes 10/27/2011     Health Maintenance Due  Topic Date Due  . PAP SMEAR-Modifier  07/07/1997  . INFLUENZA VACCINE  02/11/2019     Review of Systems  Constitutional:+intentional weight loss.  Negative for fever, chills, diaphoresis, activity change, appetite change, fatigue and unexpected weight change.  HENT: Negative for congestion, sore throat, rhinorrhea, sneezing, trouble swallowing and sinus pressure.  Eyes: Negative for photophobia and visual disturbance.  Respiratory: Negative for cough, chest tightness, shortness of breath, wheezing and stridor.  Cardiovascular: Negative for chest pain, palpitations and leg swelling.  Gastrointestinal: Negative for nausea, vomiting, abdominal pain, diarrhea, constipation, blood in stool, abdominal distention and anal bleeding.  Genitourinary: Negative for dysuria, hematuria, flank pain and difficulty urinating.  Musculoskeletal: Negative for myalgias, back pain, joint swelling, arthralgias and gait problem.  Skin: Negative for color change, pallor, rash and wound.  Neurological: Negative for dizziness, tremors, weakness and light-headedness.  Hematological: Negative for adenopathy. Does not bruise/bleed easily.  Psychiatric/Behavioral: Negative for behavioral problems, confusion, sleep disturbance, dysphoric mood, decreased concentration and agitation.    Physical Exam   BP (!) 178/97 Comment: out of meds x 3 days, no symptoms.  Pulse 97   Temp 98.2 F (36.8 C) (Oral)   Ht 5\' 9"  (1.753 m)   Wt (!) 321 lb (145.6 kg)   SpO2 99%   BMI 47.40 kg/m   Physical Exam  Constitutional:  oriented to person, place, and time. appears well-developed and well-nourished. No distress.  HENT: [place]/AT, PERRLA, no scleral icterus Mouth/Throat: Oropharynx is clear and moist. No oropharyngeal exudate.  Cardiovascular: Normal rate, regular rhythm and normal heart sounds. Exam reveals no gallop and no friction rub.  No murmur heard.  Pulmonary/Chest: Effort normal and breath sounds normal. No respiratory distress.  has no wheezes.  Neck =  supple, no nuchal rigidity Abdominal: Soft. Bowel sounds are normal.  exhibits no distension. There is no tenderness.   Lymphadenopathy: no cervical adenopathy. No axillary adenopathy Neurological: alert and oriented to person, place, and time.  Skin: Skin is warm and dry. No rash noted. No erythema.  Psychiatric: a normal mood and affect.  behavior is normal.   Lab Results  Component Value Date   CD4TCELL 29 (L) 01/24/2019   Lab Results  Component Value Date   CD4TABS 550 01/24/2019   CD4TABS 490 06/29/2018   CD4TABS 510 03/31/2017   Lab Results  Component Value Date   HIV1RNAQUANT 132 (H) 01/24/2019   Lab Results  Component Value Date   HEPBSAB NEG 10/08/2011   Lab Results  Component Value Date   LABRPR NON-REACTIVE 01/24/2019    CBC Lab Results  Component Value Date   WBC 6.6 01/24/2019   RBC 4.58 01/24/2019   HGB 14.3 01/24/2019   HCT 40.8 01/24/2019   PLT 281 01/24/2019   MCV 89.1 01/24/2019   MCH 31.2 01/24/2019   MCHC 35.0 01/24/2019   RDW 14.1 01/24/2019   LYMPHSABS 2,046 01/24/2019   MONOABS 616 09/22/2016   EOSABS 132 01/24/2019    BMET Lab Results  Component Value Date   NA 140 01/24/2019   K 4.2 01/24/2019   CL 105 01/24/2019   CO2 29 01/24/2019   GLUCOSE 104 (H) 01/24/2019   BUN 12 01/24/2019   CREATININE 1.21 01/24/2019   CALCIUM 9.1 01/24/2019   GFRNONAA 73 01/24/2019   GFRAA 85 01/24/2019      Assessment and Plan  Health maintenance = will receive Flu shot today  HIV disease= will need to get labs and Refill meds since out of meds x 3 day, lost ? - will change to biktarvy Grief = recent loss of family member, recommended counseling if needed  Patient asking for letter for unemployment

## 2019-06-22 LAB — T-HELPER CELL (CD4) - (RCID CLINIC ONLY)
CD4 % Helper T Cell: 28 % — ABNORMAL LOW (ref 33–65)
CD4 T Cell Abs: 437 /uL (ref 400–1790)

## 2019-06-24 ENCOUNTER — Other Ambulatory Visit: Payer: Self-pay | Admitting: Internal Medicine

## 2019-06-24 DIAGNOSIS — B20 Human immunodeficiency virus [HIV] disease: Secondary | ICD-10-CM

## 2019-06-27 LAB — CBC WITH DIFFERENTIAL/PLATELET
Absolute Monocytes: 507 cells/uL (ref 200–950)
Basophils Absolute: 39 cells/uL (ref 0–200)
Basophils Relative: 0.9 %
Eosinophils Absolute: 69 cells/uL (ref 15–500)
Eosinophils Relative: 1.6 %
HCT: 45.9 % (ref 38.5–50.0)
Hemoglobin: 15.2 g/dL (ref 13.2–17.1)
Lymphs Abs: 1638 cells/uL (ref 850–3900)
MCH: 28.1 pg (ref 27.0–33.0)
MCHC: 33.1 g/dL (ref 32.0–36.0)
MCV: 84.8 fL (ref 80.0–100.0)
MPV: 10.2 fL (ref 7.5–12.5)
Monocytes Relative: 11.8 %
Neutro Abs: 2047 cells/uL (ref 1500–7800)
Neutrophils Relative %: 47.6 %
Platelets: 238 10*3/uL (ref 140–400)
RBC: 5.41 10*6/uL (ref 4.20–5.80)
RDW: 14.9 % (ref 11.0–15.0)
Total Lymphocyte: 38.1 %
WBC: 4.3 10*3/uL (ref 3.8–10.8)

## 2019-06-27 LAB — COMPLETE METABOLIC PANEL WITH GFR
AG Ratio: 1.1 (calc) (ref 1.0–2.5)
ALT: 25 U/L (ref 9–46)
AST: 15 U/L (ref 10–40)
Albumin: 4 g/dL (ref 3.6–5.1)
Alkaline phosphatase (APISO): 96 U/L (ref 36–130)
BUN: 10 mg/dL (ref 7–25)
CO2: 29 mmol/L (ref 20–32)
Calcium: 9.2 mg/dL (ref 8.6–10.3)
Chloride: 103 mmol/L (ref 98–110)
Creat: 1.09 mg/dL (ref 0.60–1.35)
GFR, Est African American: 97 mL/min/{1.73_m2} (ref >=60)
GFR, Est Non African American: 83 mL/min/{1.73_m2} (ref >=60)
Globulin: 3.8 g/dL (calc) — ABNORMAL HIGH (ref 1.9–3.7)
Glucose, Bld: 93 mg/dL (ref 65–99)
Potassium: 4.2 mmol/L (ref 3.5–5.3)
Sodium: 139 mmol/L (ref 135–146)
Total Bilirubin: 0.4 mg/dL (ref 0.2–1.2)
Total Protein: 7.8 g/dL (ref 6.1–8.1)

## 2019-06-27 LAB — LIPID PANEL
Cholesterol: 205 mg/dL — ABNORMAL HIGH (ref <200)
HDL: 33 mg/dL — ABNORMAL LOW (ref >=40)
LDL Cholesterol (Calc): 147 mg/dL (calc) — ABNORMAL HIGH
Non-HDL Cholesterol (Calc): 172 mg/dL (calc) — ABNORMAL HIGH (ref <130)
Total CHOL/HDL Ratio: 6.2 (calc) — ABNORMAL HIGH (ref <5.0)
Triglycerides: 127 mg/dL (ref <150)

## 2019-06-27 LAB — RPR: RPR Ser Ql: NONREACTIVE

## 2019-06-27 LAB — HIV-1 RNA QUANT-NO REFLEX-BLD
HIV 1 RNA Quant: 67 copies/mL — ABNORMAL HIGH
HIV-1 RNA Quant, Log: 1.83 Log copies/mL — ABNORMAL HIGH

## 2019-07-03 ENCOUNTER — Ambulatory Visit: Payer: Medicaid Other | Admitting: Internal Medicine

## 2019-07-28 ENCOUNTER — Telehealth: Payer: Self-pay | Admitting: *Deleted

## 2019-07-28 NOTE — Telephone Encounter (Signed)
Patient walked in, asked for a letter with proof of positivity so she can work with [employer] for housing. Andree Coss, RN

## 2019-08-10 ENCOUNTER — Encounter: Payer: Self-pay | Admitting: *Deleted

## 2019-08-10 NOTE — Telephone Encounter (Signed)
Per Dr Drue Second, ok to write letter with patient's diagnosis.  Will leave at front desk for pick up. Left message for patient to let her know, to see if she needed specific information on the letter. Andree Coss, RN

## 2019-09-04 ENCOUNTER — Other Ambulatory Visit: Payer: Self-pay

## 2019-09-04 DIAGNOSIS — B029 Zoster without complications: Secondary | ICD-10-CM

## 2019-09-04 MED ORDER — VALACYCLOVIR HCL 500 MG PO TABS: ORAL_TABLET | ORAL | 1 refills | Status: DC

## 2019-09-04 MED ORDER — PAROXETINE HCL 10 MG PO TABS: 10.0000 mg | ORAL_TABLET | Freq: Every day | ORAL | 0 refills | Status: DC

## 2019-09-27 ENCOUNTER — Ambulatory Visit: Payer: Medicaid Other | Admitting: Internal Medicine

## 2019-10-25 ENCOUNTER — Other Ambulatory Visit: Payer: Self-pay

## 2019-10-25 DIAGNOSIS — B029 Zoster without complications: Secondary | ICD-10-CM

## 2019-10-25 DIAGNOSIS — I10 Essential (primary) hypertension: Secondary | ICD-10-CM

## 2019-10-25 MED ORDER — VALACYCLOVIR HCL 500 MG PO TABS: ORAL_TABLET | ORAL | 1 refills | Status: DC

## 2019-10-25 MED ORDER — HYDROCHLOROTHIAZIDE 25 MG PO TABS: ORAL_TABLET | ORAL | 0 refills | Status: DC

## 2019-10-25 MED ORDER — PAROXETINE HCL 10 MG PO TABS: 10.0000 mg | ORAL_TABLET | Freq: Every day | ORAL | 0 refills | Status: DC

## 2019-10-25 MED ORDER — AMLODIPINE BESYLATE 10 MG PO TABS: ORAL_TABLET | ORAL | 0 refills | Status: DC

## 2019-11-06 ENCOUNTER — Ambulatory Visit: Payer: Medicaid Other | Admitting: Internal Medicine

## 2019-11-30 ENCOUNTER — Other Ambulatory Visit: Payer: Self-pay | Admitting: *Deleted

## 2019-11-30 DIAGNOSIS — R4589 Other symptoms and signs involving emotional state: Secondary | ICD-10-CM

## 2019-11-30 MED ORDER — PAROXETINE HCL 10 MG PO TABS: 10.0000 mg | ORAL_TABLET | Freq: Every day | ORAL | 0 refills | Status: DC

## 2019-12-01 ENCOUNTER — Telehealth: Payer: Self-pay | Admitting: Pharmacy Technician

## 2019-12-01 ENCOUNTER — Telehealth: Payer: Self-pay | Admitting: *Deleted

## 2019-12-01 ENCOUNTER — Encounter: Payer: Self-pay | Admitting: Internal Medicine

## 2019-12-01 NOTE — Telephone Encounter (Signed)
Patient let her ADAP lapse, RN connected to Roe Coombs and Lorene Dy for financial renewal and medication assistance. Sent in Paxil to [employer] with GoodRx coupon information for patient to pay out of pocket. Andree Coss, RN

## 2019-12-01 NOTE — Telephone Encounter (Signed)
[facility] Patient Advocate Encounter   Patient has been approved for [employer] Advancing Access Patient Assistance Program for [place]  for 30 day coverage. This assistance will make the patient's copay $0.  I have spoken with the patient and they will pick up today at [facility].   The billing information is    Patient knows to call the office with questions or concerns. UMAP application is pending.  Beulah Gandy, CPhT Specialty Pharmacy Patient [facility] Phone: [phone] Fax: [phone] 12/01/2019 11:54 AM

## 2019-12-06 ENCOUNTER — Telehealth: Payer: Self-pay | Admitting: Pharmacy Technician

## 2019-12-06 NOTE — Telephone Encounter (Signed)
[facility] Patient Advocate Encounter   Patient has been approved for [employer] Advancing Access Patient Assistance Program for [place]  from 12/01/2019 to 11/30/2020.  This assistance will make the patient's copay $0.  I have spoken with the patient and they picked up all of their medications yesterday from [facility].  The billing information is listed below.   Patient knows to call the office with questions or concerns. He is umap pending as well and will switch coverage once approved and will remain at [facility].  Beulah Gandy, CPhT Specialty Pharmacy Patient [facility] for [facility] Phone: [phone] Fax: [phone] 12/06/2019 3:49 PM

## 2019-12-12 ENCOUNTER — Other Ambulatory Visit: Payer: Self-pay | Admitting: *Deleted

## 2019-12-12 DIAGNOSIS — B20 Human immunodeficiency virus [HIV] disease: Secondary | ICD-10-CM

## 2019-12-14 ENCOUNTER — Other Ambulatory Visit: Payer: Medicaid Other

## 2019-12-28 ENCOUNTER — Encounter: Payer: Medicaid Other | Admitting: Internal Medicine

## 2019-12-29 ENCOUNTER — Other Ambulatory Visit: Payer: Self-pay | Admitting: Internal Medicine

## 2019-12-29 DIAGNOSIS — I10 Essential (primary) hypertension: Secondary | ICD-10-CM

## 2020-01-08 ENCOUNTER — Other Ambulatory Visit: Payer: Self-pay

## 2020-01-08 ENCOUNTER — Telehealth (INDEPENDENT_AMBULATORY_CARE_PROVIDER_SITE_OTHER): Payer: Self-pay | Admitting: Internal Medicine

## 2020-01-08 DIAGNOSIS — R4589 Other symptoms and signs involving emotional state: Secondary | ICD-10-CM

## 2020-01-08 DIAGNOSIS — I10 Essential (primary) hypertension: Secondary | ICD-10-CM

## 2020-01-08 DIAGNOSIS — B20 Human immunodeficiency virus [HIV] disease: Secondary | ICD-10-CM

## 2020-01-08 DIAGNOSIS — Z79899 Other long term (current) drug therapy: Secondary | ICD-10-CM

## 2020-01-08 DIAGNOSIS — Z113 Encounter for screening for infections with a predominantly sexual mode of transmission: Secondary | ICD-10-CM

## 2020-01-08 MED ORDER — BICTEGRAVIR-EMTRICITAB-TENOFOV 50-200-25 MG PO TABS: 1.0000 | ORAL_TABLET | Freq: Every day | ORAL | 11 refills | Status: DC

## 2020-01-08 MED ORDER — PAROXETINE HCL 10 MG PO TABS: 10.0000 mg | ORAL_TABLET | Freq: Every day | ORAL | 11 refills | Status: DC

## 2020-01-08 MED ORDER — HYDROCHLOROTHIAZIDE 25 MG PO TABS: ORAL_TABLET | ORAL | 11 refills | Status: DC

## 2020-01-08 MED ORDER — AMLODIPINE BESYLATE 10 MG PO TABS: 10.0000 mg | ORAL_TABLET | Freq: Every day | ORAL | 11 refills | Status: DC

## 2020-01-08 NOTE — Progress Notes (Signed)
Virtual Visit via Telephone Note  I connected with Jeremiah Stevens on 01/08/20 at 10:45 AM EDT by telephone and verified that I am speaking with the correct person using two identifiers.  Location: Patient: at home Provider: at clinic   I discussed the limitations, risks, security and privacy concerns of performing an evaluation and management service by telephone and the availability of in person appointments. I also discussed with the patient that there may be a patient responsible charge related to this service. The patient expressed understanding and agreed to proceed.   History of Present Illness: Has persistent back pain. Lumbar region. Stretching initially helped. Cream and ibuprofen   Observations/Objective:   Assessment and Plan:   Follow Up Instructions:  - need to do xray of lumbar back - lab work check VL - needs refill for hiv medication - htn will give refills - paxil-refills  I discussed the assessment and treatment plan with the patient. The patient was provided an opportunity to ask questions and all were answered. The patient agreed with the plan and demonstrated an understanding of the instructions.   The patient was advised to call back or seek an in-person evaluation if the symptoms worsen or if the condition fails to improve as anticipated.  I provided 30 minutes of non-face-to-face time during this encounter.   Judyann Munson, MD

## 2020-01-08 NOTE — Addendum Note (Signed)
Addended byJimmy Picket F on: 01/08/2020 11:50 AM   Modules accepted: Orders

## 2020-01-09 LAB — URINE CYTOLOGY ANCILLARY ONLY
Chlamydia: NEGATIVE
Comment: NEGATIVE
Comment: NORMAL
Neisseria Gonorrhea: NEGATIVE

## 2020-01-09 LAB — T-HELPER CELL (CD4) - (RCID CLINIC ONLY)
CD4 % Helper T Cell: 32 % — ABNORMAL LOW (ref 33–65)
CD4 T Cell Abs: 576 /uL (ref 400–1790)

## 2020-01-10 LAB — CBC WITH DIFFERENTIAL/PLATELET
Absolute Monocytes: 541 cells/uL (ref 200–950)
Basophils Absolute: 32 cells/uL (ref 0–200)
Basophils Relative: 0.6 %
Eosinophils Absolute: 101 cells/uL (ref 15–500)
Eosinophils Relative: 1.9 %
HCT: 43.3 % (ref 38.5–50.0)
Hemoglobin: 14.3 g/dL (ref 13.2–17.1)
Lymphs Abs: 1855 cells/uL (ref 850–3900)
MCH: 28.9 pg (ref 27.0–33.0)
MCHC: 33 g/dL (ref 32.0–36.0)
MCV: 87.5 fL (ref 80.0–100.0)
MPV: 10.8 fL (ref 7.5–12.5)
Monocytes Relative: 10.2 %
Neutro Abs: 2772 cells/uL (ref 1500–7800)
Neutrophils Relative %: 52.3 %
Platelets: 207 10*3/uL (ref 140–400)
RBC: 4.95 10*6/uL (ref 4.20–5.80)
RDW: 14.7 % (ref 11.0–15.0)
Total Lymphocyte: 35 %
WBC: 5.3 10*3/uL (ref 3.8–10.8)

## 2020-01-10 LAB — COMPLETE METABOLIC PANEL WITH GFR
AG Ratio: 0.9 (calc) — ABNORMAL LOW (ref 1.0–2.5)
ALT: 33 U/L (ref 9–46)
AST: 17 U/L (ref 10–40)
Albumin: 3.7 g/dL (ref 3.6–5.1)
Alkaline phosphatase (APISO): 108 U/L (ref 36–130)
BUN: 12 mg/dL (ref 7–25)
CO2: 27 mmol/L (ref 20–32)
Calcium: 9 mg/dL (ref 8.6–10.3)
Chloride: 101 mmol/L (ref 98–110)
Creat: 1.12 mg/dL (ref 0.60–1.35)
GFR, Est African American: 93 mL/min/{1.73_m2} (ref >=60)
GFR, Est Non African American: 80 mL/min/{1.73_m2} (ref >=60)
Globulin: 4 g/dL (calc) — ABNORMAL HIGH (ref 1.9–3.7)
Glucose, Bld: 287 mg/dL — ABNORMAL HIGH (ref 65–99)
Potassium: 4.1 mmol/L (ref 3.5–5.3)
Sodium: 137 mmol/L (ref 135–146)
Total Bilirubin: 0.3 mg/dL (ref 0.2–1.2)
Total Protein: 7.7 g/dL (ref 6.1–8.1)

## 2020-01-10 LAB — HIV-1 RNA QUANT-NO REFLEX-BLD
HIV 1 RNA Quant: <20 copies/mL — AB
HIV-1 RNA Quant, Log: <1.3 Log copies/mL — AB

## 2020-02-01 ENCOUNTER — Encounter (HOSPITAL_COMMUNITY): Payer: Self-pay | Admitting: Emergency Medicine

## 2020-02-01 ENCOUNTER — Other Ambulatory Visit: Payer: Self-pay

## 2020-02-01 ENCOUNTER — Emergency Department (HOSPITAL_COMMUNITY)
Admission: EM | Admit: 2020-02-01 | Discharge: 2020-02-01 | Disposition: A | Discharge status: Home / Self Care | Payer: Medicaid Other | Attending: Emergency Medicine | Admitting: Emergency Medicine

## 2020-02-01 DIAGNOSIS — R109 Unspecified abdominal pain: Secondary | ICD-10-CM | POA: Insufficient documentation

## 2020-02-01 DIAGNOSIS — Z5321 Procedure and treatment not carried out due to patient leaving prior to being seen by health care provider: Secondary | ICD-10-CM | POA: Insufficient documentation

## 2020-02-01 DIAGNOSIS — K5900 Constipation, unspecified: Secondary | ICD-10-CM | POA: Insufficient documentation

## 2020-02-01 LAB — COMPREHENSIVE METABOLIC PANEL
ALT: 33 U/L (ref 0–44)
AST: 17 U/L (ref 15–41)
Albumin: 3.7 g/dL (ref 3.5–5.0)
Alkaline Phosphatase: 120 U/L (ref 38–126)
Anion gap: 14 (ref 5–15)
BUN: 19 mg/dL (ref 6–20)
CO2: 23 mmol/L (ref 22–32)
Calcium: 8.9 mg/dL (ref 8.9–10.3)
Chloride: 88 mmol/L — ABNORMAL LOW (ref 98–111)
Creatinine, Ser: 1.41 mg/dL — ABNORMAL HIGH (ref 0.61–1.24)
GFR calc Af Amer: >60 mL/min (ref >60)
GFR calc non Af Amer: >60 mL/min (ref >60)
Glucose, Bld: 707 mg/dL (ref 70–99)
Potassium: 4.9 mmol/L (ref 3.5–5.1)
Sodium: 125 mmol/L — ABNORMAL LOW (ref 135–145)
Total Bilirubin: 1 mg/dL (ref 0.3–1.2)
Total Protein: 8.4 g/dL — ABNORMAL HIGH (ref 6.5–8.1)

## 2020-02-01 LAB — CBC
HCT: 49 % (ref 39.0–52.0)
Hemoglobin: 16.3 g/dL (ref 13.0–17.0)
MCH: 29.1 pg (ref 26.0–34.0)
MCHC: 33.3 g/dL (ref 30.0–36.0)
MCV: 87.5 fL (ref 80.0–100.0)
Platelets: 228 10*3/uL (ref 150–400)
RBC: 5.6 MIL/uL (ref 4.22–5.81)
RDW: 14.4 % (ref 11.5–15.5)
WBC: 6.2 10*3/uL (ref 4.0–10.5)
nRBC: 0 % (ref 0.0–0.2)

## 2020-02-01 LAB — LIPASE, BLOOD: Lipase: 31 U/L (ref 11–51)

## 2020-02-01 NOTE — ED Notes (Signed)
Date and time results received: 02/01/20 8:09 PM  Test: Glucose Critical Value: 707  Name of Provider Notified: Long, EDP

## 2020-02-01 NOTE — ED Notes (Signed)
Pt called for rooming x3! No answer! 

## 2020-02-01 NOTE — ED Triage Notes (Signed)
Patient c/o abdominal pain with constipation x1 week. Unsure of last BM. Denies vomiting. Reports abdominal distension.

## 2020-02-09 ENCOUNTER — Encounter (HOSPITAL_COMMUNITY): Payer: Self-pay | Admitting: Emergency Medicine

## 2020-02-09 ENCOUNTER — Other Ambulatory Visit: Payer: Self-pay

## 2020-02-09 ENCOUNTER — Inpatient Hospital Stay (HOSPITAL_COMMUNITY)
Admission: EM | Admit: 2020-02-09 | Discharge: 2020-02-12 | DRG: 638 | Disposition: A | Discharge status: Home / Self Care | Payer: Medicaid Other | Attending: Internal Medicine | Admitting: Internal Medicine

## 2020-02-09 DIAGNOSIS — G4733 Obstructive sleep apnea (adult) (pediatric): Secondary | ICD-10-CM | POA: Diagnosis present

## 2020-02-09 DIAGNOSIS — Z79899 Other long term (current) drug therapy: Secondary | ICD-10-CM

## 2020-02-09 DIAGNOSIS — I10 Essential (primary) hypertension: Secondary | ICD-10-CM | POA: Diagnosis present

## 2020-02-09 DIAGNOSIS — G43909 Migraine, unspecified, not intractable, without status migrainosus: Secondary | ICD-10-CM | POA: Diagnosis present

## 2020-02-09 DIAGNOSIS — R3915 Urgency of urination: Secondary | ICD-10-CM | POA: Diagnosis present

## 2020-02-09 DIAGNOSIS — Z91010 Allergy to peanuts: Secondary | ICD-10-CM

## 2020-02-09 DIAGNOSIS — Z8249 Family history of ischemic heart disease and other diseases of the circulatory system: Secondary | ICD-10-CM

## 2020-02-09 DIAGNOSIS — K5900 Constipation, unspecified: Secondary | ICD-10-CM | POA: Diagnosis present

## 2020-02-09 DIAGNOSIS — E781 Pure hyperglyceridemia: Secondary | ICD-10-CM | POA: Diagnosis present

## 2020-02-09 DIAGNOSIS — Z6841 Body Mass Index (BMI) 40.0 and over, adult: Secondary | ICD-10-CM

## 2020-02-09 DIAGNOSIS — E785 Hyperlipidemia, unspecified: Secondary | ICD-10-CM | POA: Diagnosis present

## 2020-02-09 DIAGNOSIS — R12 Heartburn: Secondary | ICD-10-CM | POA: Diagnosis present

## 2020-02-09 DIAGNOSIS — E119 Type 2 diabetes mellitus without complications: Secondary | ICD-10-CM

## 2020-02-09 DIAGNOSIS — R350 Frequency of micturition: Secondary | ICD-10-CM | POA: Diagnosis present

## 2020-02-09 DIAGNOSIS — E1100 Type 2 diabetes mellitus with hyperosmolarity without nonketotic hyperglycemic-hyperosmolar coma (NKHHC): Secondary | ICD-10-CM | POA: Diagnosis present

## 2020-02-09 DIAGNOSIS — F17210 Nicotine dependence, cigarettes, uncomplicated: Secondary | ICD-10-CM | POA: Diagnosis present

## 2020-02-09 DIAGNOSIS — E1101 Type 2 diabetes mellitus with hyperosmolarity with coma: Principal | ICD-10-CM | POA: Diagnosis present

## 2020-02-09 DIAGNOSIS — F649 Gender identity disorder, unspecified: Secondary | ICD-10-CM

## 2020-02-09 DIAGNOSIS — Z20822 Contact with and (suspected) exposure to covid-19: Secondary | ICD-10-CM | POA: Diagnosis present

## 2020-02-09 DIAGNOSIS — E1143 Type 2 diabetes mellitus with diabetic autonomic (poly)neuropathy: Secondary | ICD-10-CM | POA: Diagnosis present

## 2020-02-09 DIAGNOSIS — A6002 Herpesviral infection of other male genital organs: Secondary | ICD-10-CM | POA: Diagnosis present

## 2020-02-09 DIAGNOSIS — Z888 Allergy status to other drugs, medicaments and biological substances status: Secondary | ICD-10-CM

## 2020-02-09 DIAGNOSIS — B20 Human immunodeficiency virus [HIV] disease: Secondary | ICD-10-CM | POA: Diagnosis present

## 2020-02-09 DIAGNOSIS — Z21 Asymptomatic human immunodeficiency virus [HIV] infection status: Secondary | ICD-10-CM | POA: Diagnosis present

## 2020-02-09 HISTORY — DX: Type 2 diabetes mellitus without complications: E11.9

## 2020-02-09 MED ORDER — SODIUM CHLORIDE 0.9% FLUSH
3.0000 mL | Freq: Once | INTRAVENOUS | Status: AC
  Administered 2020-02-10: 3 mL via INTRAVENOUS

## 2020-02-09 NOTE — ED Triage Notes (Signed)
Patient reports constipation X several weeks. Has been seen for same recently.

## 2020-02-10 ENCOUNTER — Other Ambulatory Visit: Payer: Self-pay

## 2020-02-10 ENCOUNTER — Emergency Department (HOSPITAL_COMMUNITY): Payer: Medicaid Other

## 2020-02-10 DIAGNOSIS — I10 Essential (primary) hypertension: Secondary | ICD-10-CM

## 2020-02-10 DIAGNOSIS — B20 Human immunodeficiency virus [HIV] disease: Secondary | ICD-10-CM

## 2020-02-10 DIAGNOSIS — G909 Disorder of the autonomic nervous system, unspecified: Secondary | ICD-10-CM

## 2020-02-10 DIAGNOSIS — E1100 Type 2 diabetes mellitus with hyperosmolarity without nonketotic hyperglycemic-hyperosmolar coma (NKHHC): Secondary | ICD-10-CM

## 2020-02-10 DIAGNOSIS — F649 Gender identity disorder, unspecified: Secondary | ICD-10-CM

## 2020-02-10 DIAGNOSIS — E1101 Type 2 diabetes mellitus with hyperosmolarity with coma: Secondary | ICD-10-CM | POA: Diagnosis present

## 2020-02-10 HISTORY — DX: Type 2 diabetes mellitus with hyperosmolarity without nonketotic hyperglycemic-hyperosmolar coma (NKHHC): E11.00

## 2020-02-10 LAB — PHOSPHORUS
Phosphorus: 2.7 mg/dL (ref 2.5–4.6)
Phosphorus: 2.7 mg/dL (ref 2.5–4.6)
Phosphorus: 3 mg/dL (ref 2.5–4.6)

## 2020-02-10 LAB — BASIC METABOLIC PANEL
Anion gap: 15 (ref 5–15)
Anion gap: 10 (ref 5–15)
Anion gap: 9 (ref 5–15)
Anion gap: 9 (ref 5–15)
BUN: 10 mg/dL (ref 6–20)
BUN: 9 mg/dL (ref 6–20)
BUN: 9 mg/dL (ref 6–20)
BUN: 9 mg/dL (ref 6–20)
CO2: 18 mmol/L — ABNORMAL LOW (ref 22–32)
CO2: 23 mmol/L (ref 22–32)
CO2: 23 mmol/L (ref 22–32)
CO2: 25 mmol/L (ref 22–32)
Calcium: 9 mg/dL (ref 8.9–10.3)
Calcium: 8.6 mg/dL — ABNORMAL LOW (ref 8.9–10.3)
Calcium: 8.6 mg/dL — ABNORMAL LOW (ref 8.9–10.3)
Calcium: 8.5 mg/dL — ABNORMAL LOW (ref 8.9–10.3)
Chloride: 103 mmol/L (ref 98–111)
Chloride: 103 mmol/L (ref 98–111)
Chloride: 105 mmol/L (ref 98–111)
Chloride: 102 mmol/L (ref 98–111)
Creatinine, Ser: 1.12 mg/dL (ref 0.61–1.24)
Creatinine, Ser: 1.03 mg/dL (ref 0.61–1.24)
Creatinine, Ser: 1.08 mg/dL (ref 0.61–1.24)
Creatinine, Ser: 0.94 mg/dL (ref 0.61–1.24)
GFR calc Af Amer: >60 mL/min (ref >60)
GFR calc Af Amer: >60 mL/min (ref >60)
GFR calc Af Amer: >60 mL/min (ref >60)
GFR calc Af Amer: >60 mL/min (ref >60)
GFR calc non Af Amer: >60 mL/min (ref >60)
GFR calc non Af Amer: >60 mL/min (ref >60)
GFR calc non Af Amer: >60 mL/min (ref >60)
GFR calc non Af Amer: >60 mL/min (ref >60)
Glucose, Bld: 200 mg/dL — ABNORMAL HIGH (ref 70–99)
Glucose, Bld: 162 mg/dL — ABNORMAL HIGH (ref 70–99)
Glucose, Bld: 124 mg/dL — ABNORMAL HIGH (ref 70–99)
Glucose, Bld: 250 mg/dL — ABNORMAL HIGH (ref 70–99)
Potassium: 3.8 mmol/L (ref 3.5–5.1)
Potassium: 3.1 mmol/L — ABNORMAL LOW (ref 3.5–5.1)
Potassium: 3.5 mmol/L (ref 3.5–5.1)
Potassium: 3.1 mmol/L — ABNORMAL LOW (ref 3.5–5.1)
Sodium: 136 mmol/L (ref 135–145)
Sodium: 136 mmol/L (ref 135–145)
Sodium: 137 mmol/L (ref 135–145)
Sodium: 136 mmol/L (ref 135–145)

## 2020-02-10 LAB — URINALYSIS, ROUTINE W REFLEX MICROSCOPIC
Bacteria, UA: NONE SEEN
Bilirubin Urine: NEGATIVE
Glucose, UA: >=500 mg/dL — AB
Hgb urine dipstick: NEGATIVE
Ketones, ur: NEGATIVE mg/dL
Leukocytes,Ua: NEGATIVE
Nitrite: NEGATIVE
Protein, ur: NEGATIVE mg/dL
Specific Gravity, Urine: 1.025 (ref 1.005–1.030)
pH: 5 (ref 5.0–8.0)

## 2020-02-10 LAB — SARS CORONAVIRUS 2 BY RT PCR (HOSPITAL ORDER, PERFORMED IN ~~LOC~~ HOSPITAL LAB): SARS Coronavirus 2: NEGATIVE

## 2020-02-10 LAB — COMPREHENSIVE METABOLIC PANEL
ALT: 35 U/L (ref 0–44)
AST: 13 U/L — ABNORMAL LOW (ref 15–41)
Albumin: 3.3 g/dL — ABNORMAL LOW (ref 3.5–5.0)
Alkaline Phosphatase: 111 U/L (ref 38–126)
Anion gap: 12 (ref 5–15)
BUN: 12 mg/dL (ref 6–20)
CO2: 21 mmol/L — ABNORMAL LOW (ref 22–32)
Calcium: 8.9 mg/dL (ref 8.9–10.3)
Chloride: 88 mmol/L — ABNORMAL LOW (ref 98–111)
Creatinine, Ser: 1.45 mg/dL — ABNORMAL HIGH (ref 0.61–1.24)
GFR calc Af Amer: >60 mL/min (ref >60)
GFR calc non Af Amer: 59 mL/min — ABNORMAL LOW (ref >60)
Glucose, Bld: 998 mg/dL (ref 70–99)
Potassium: 4.3 mmol/L (ref 3.5–5.1)
Sodium: 121 mmol/L — ABNORMAL LOW (ref 135–145)
Total Bilirubin: 0.9 mg/dL (ref 0.3–1.2)
Total Protein: 8 g/dL (ref 6.5–8.1)

## 2020-02-10 LAB — CBC
HCT: 45.2 % (ref 39.0–52.0)
HCT: 45.7 % (ref 39.0–52.0)
Hemoglobin: 14.8 g/dL (ref 13.0–17.0)
Hemoglobin: 15.5 g/dL (ref 13.0–17.0)
MCH: 28.6 pg (ref 26.0–34.0)
MCH: 29.8 pg (ref 26.0–34.0)
MCHC: 32.7 g/dL (ref 30.0–36.0)
MCHC: 33.9 g/dL (ref 30.0–36.0)
MCV: 87.3 fL (ref 80.0–100.0)
MCV: 87.9 fL (ref 80.0–100.0)
Platelets: 210 10*3/uL (ref 150–400)
Platelets: 202 10*3/uL (ref 150–400)
RBC: 5.18 MIL/uL (ref 4.22–5.81)
RBC: 5.2 MIL/uL (ref 4.22–5.81)
RDW: 14.5 % (ref 11.5–15.5)
RDW: 14.6 % (ref 11.5–15.5)
WBC: 5.9 10*3/uL (ref 4.0–10.5)
WBC: 4.6 10*3/uL (ref 4.0–10.5)
nRBC: 0 % (ref 0.0–0.2)
nRBC: 0 % (ref 0.0–0.2)

## 2020-02-10 LAB — GLUCOSE, CAPILLARY
Glucose-Capillary: 152 mg/dL — ABNORMAL HIGH (ref 70–99)
Glucose-Capillary: 128 mg/dL — ABNORMAL HIGH (ref 70–99)
Glucose-Capillary: 220 mg/dL — ABNORMAL HIGH (ref 70–99)
Glucose-Capillary: 197 mg/dL — ABNORMAL HIGH (ref 70–99)
Glucose-Capillary: 231 mg/dL — ABNORMAL HIGH (ref 70–99)
Glucose-Capillary: 291 mg/dL — ABNORMAL HIGH (ref 70–99)

## 2020-02-10 LAB — LIPID PANEL
Cholesterol: 174 mg/dL (ref 0–200)
HDL: 27 mg/dL — ABNORMAL LOW (ref >40)
LDL Cholesterol: 78 mg/dL (ref 0–99)
Total CHOL/HDL Ratio: 6.4 RATIO
Triglycerides: 343 mg/dL — ABNORMAL HIGH (ref <150)
VLDL: 69 mg/dL — ABNORMAL HIGH (ref 0–40)

## 2020-02-10 LAB — CBG MONITORING, ED
Glucose-Capillary: >600 mg/dL (ref 70–99)
Glucose-Capillary: >600 mg/dL (ref 70–99)
Glucose-Capillary: 320 mg/dL — ABNORMAL HIGH (ref 70–99)
Glucose-Capillary: 262 mg/dL — ABNORMAL HIGH (ref 70–99)
Glucose-Capillary: 193 mg/dL — ABNORMAL HIGH (ref 70–99)
Glucose-Capillary: 193 mg/dL — ABNORMAL HIGH (ref 70–99)
Glucose-Capillary: 177 mg/dL — ABNORMAL HIGH (ref 70–99)
Glucose-Capillary: 161 mg/dL — ABNORMAL HIGH (ref 70–99)
Glucose-Capillary: 161 mg/dL — ABNORMAL HIGH (ref 70–99)

## 2020-02-10 LAB — LIPASE, BLOOD: Lipase: 30 U/L (ref 11–51)

## 2020-02-10 LAB — TSH: TSH: 1.238 u[IU]/mL (ref 0.350–4.500)

## 2020-02-10 LAB — OSMOLALITY: Osmolality: 312 mOsm/kg — ABNORMAL HIGH (ref 275–295)

## 2020-02-10 LAB — MAGNESIUM
Magnesium: 2.1 mg/dL (ref 1.7–2.4)
Magnesium: 2.1 mg/dL (ref 1.7–2.4)

## 2020-02-10 MED ORDER — ADULT MULTIVITAMIN W/MINERALS CH
1.0000 | ORAL_TABLET | Freq: Every day | ORAL | Status: DC
  Administered 2020-02-11 – 2020-02-12 (×2): 1 via ORAL
  Filled 2020-02-10 (×2): qty 1

## 2020-02-10 MED ORDER — DEXTROSE-NACL 5-0.45 % IV SOLN: INTRAVENOUS | Status: DC

## 2020-02-10 MED ORDER — SODIUM CHLORIDE 0.9 % IV BOLUS
2000.0000 mL | Freq: Once | INTRAVENOUS | Status: AC
  Administered 2020-02-10: 2000 mL via INTRAVENOUS

## 2020-02-10 MED ORDER — ACETAMINOPHEN 325 MG PO TABS: 650.0000 mg | ORAL_TABLET | Freq: Four times a day (QID) | ORAL | Status: DC | PRN

## 2020-02-10 MED ORDER — POLYETHYLENE GLYCOL 3350 17 G PO PACK
17.0000 g | PACK | Freq: Two times a day (BID) | ORAL | Status: DC
  Administered 2020-02-11 (×2): 17 g via ORAL
  Filled 2020-02-10 (×4): qty 1

## 2020-02-10 MED ORDER — POLYETHYLENE GLYCOL 3350 17 GM/SCOOP PO POWD
0.5000 | Freq: Once | ORAL | Status: DC
  Filled 2020-02-10: qty 255

## 2020-02-10 MED ORDER — VALACYCLOVIR HCL 500 MG PO TABS
500.0000 mg | ORAL_TABLET | Freq: Two times a day (BID) | ORAL | Status: DC
  Administered 2020-02-10 – 2020-02-12 (×4): 500 mg via ORAL
  Filled 2020-02-10 (×4): qty 1

## 2020-02-10 MED ORDER — ONDANSETRON HCL 4 MG PO TABS: 4.0000 mg | ORAL_TABLET | Freq: Four times a day (QID) | ORAL | Status: DC | PRN

## 2020-02-10 MED ORDER — INSULIN REGULAR(HUMAN) IN NACL 100-0.9 UT/100ML-% IV SOLN
INTRAVENOUS | Status: DC
  Administered 2020-02-10: 9.5 [IU]/h via INTRAVENOUS
  Administered 2020-02-10: 4 [IU]/h via INTRAVENOUS
  Administered 2020-02-10: 15 [IU]/h via INTRAVENOUS
  Filled 2020-02-10 (×2): qty 100

## 2020-02-10 MED ORDER — PAROXETINE HCL 10 MG PO TABS
10.0000 mg | ORAL_TABLET | Freq: Every day | ORAL | Status: DC
  Administered 2020-02-10 – 2020-02-12 (×3): 10 mg via ORAL
  Filled 2020-02-10 (×3): qty 1

## 2020-02-10 MED ORDER — INSULIN ASPART 100 UNIT/ML ~~LOC~~ SOLN
0.0000 [IU] | Freq: Every day | SUBCUTANEOUS | Status: DC
  Administered 2020-02-10 – 2020-02-11 (×2): 3 [IU] via SUBCUTANEOUS

## 2020-02-10 MED ORDER — NICOTINE 14 MG/24HR TD PT24
14.0000 mg | MEDICATED_PATCH | Freq: Every day | TRANSDERMAL | Status: DC
  Administered 2020-02-11 – 2020-02-12 (×2): 14 mg via TRANSDERMAL
  Filled 2020-02-10 (×2): qty 1

## 2020-02-10 MED ORDER — BICTEGRAVIR-EMTRICITAB-TENOFOV 50-200-25 MG PO TABS
1.0000 | ORAL_TABLET | Freq: Every day | ORAL | Status: DC
  Administered 2020-02-10 – 2020-02-12 (×3): 1 via ORAL
  Filled 2020-02-10 (×3): qty 1

## 2020-02-10 MED ORDER — INSULIN ASPART 100 UNIT/ML ~~LOC~~ SOLN
0.0000 [IU] | Freq: Three times a day (TID) | SUBCUTANEOUS | Status: DC
  Administered 2020-02-11: 8 [IU] via SUBCUTANEOUS
  Administered 2020-02-11: 15 [IU] via SUBCUTANEOUS
  Administered 2020-02-11: 8 [IU] via SUBCUTANEOUS
  Administered 2020-02-12: 11 [IU] via SUBCUTANEOUS
  Administered 2020-02-12: 5 [IU] via SUBCUTANEOUS

## 2020-02-10 MED ORDER — AMLODIPINE BESYLATE 10 MG PO TABS
10.0000 mg | ORAL_TABLET | Freq: Every day | ORAL | Status: DC
  Administered 2020-02-10 – 2020-02-12 (×3): 10 mg via ORAL
  Filled 2020-02-10 (×3): qty 1

## 2020-02-10 MED ORDER — INSULIN GLARGINE 100 UNIT/ML ~~LOC~~ SOLN
10.0000 [IU] | Freq: Every day | SUBCUTANEOUS | Status: DC
  Administered 2020-02-10 – 2020-02-11 (×2): 10 [IU] via SUBCUTANEOUS
  Filled 2020-02-10 (×4): qty 0.1

## 2020-02-10 MED ORDER — POTASSIUM CHLORIDE 10 MEQ/100ML IV SOLN
10.0000 meq | INTRAVENOUS | Status: AC
  Administered 2020-02-10 (×2): 10 meq via INTRAVENOUS
  Filled 2020-02-10 (×2): qty 100

## 2020-02-10 MED ORDER — ONDANSETRON HCL 4 MG/2ML IJ SOLN: 4.0000 mg | Freq: Four times a day (QID) | INTRAMUSCULAR | Status: DC | PRN

## 2020-02-10 MED ORDER — POTASSIUM CHLORIDE CRYS ER 20 MEQ PO TBCR
40.0000 meq | EXTENDED_RELEASE_TABLET | Freq: Two times a day (BID) | ORAL | Status: AC
  Administered 2020-02-10 – 2020-02-11 (×2): 40 meq via ORAL
  Filled 2020-02-10 (×2): qty 2

## 2020-02-10 MED ORDER — DEXTROSE 50 % IV SOLN: 0.0000 mL | INTRAVENOUS | Status: DC | PRN

## 2020-02-10 MED ORDER — GLYCERIN (LAXATIVE) 2.1 G RE SUPP: 1.0000 | Freq: Once | RECTAL | Status: DC

## 2020-02-10 MED ORDER — SODIUM CHLORIDE 0.9 % IV SOLN: INTRAVENOUS | Status: DC

## 2020-02-10 MED ORDER — ACETAMINOPHEN 650 MG RE SUPP: 650.0000 mg | Freq: Four times a day (QID) | RECTAL | Status: DC | PRN

## 2020-02-10 MED ORDER — MORPHINE SULFATE (PF) 2 MG/ML IV SOLN
1.0000 mg | Freq: Once | INTRAVENOUS | Status: AC
  Administered 2020-02-10: 1 mg via INTRAVENOUS
  Filled 2020-02-10: qty 1

## 2020-02-10 MED ORDER — IOHEXOL 300 MG/ML  SOLN
125.0000 mL | Freq: Once | INTRAMUSCULAR | Status: AC | PRN
  Administered 2020-02-10: 125 mL via INTRAVENOUS

## 2020-02-10 MED ORDER — ENOXAPARIN SODIUM 40 MG/0.4ML ~~LOC~~ SOLN
40.0000 mg | SUBCUTANEOUS | Status: DC
  Administered 2020-02-10 – 2020-02-11 (×2): 40 mg via SUBCUTANEOUS
  Filled 2020-02-10 (×2): qty 0.4

## 2020-02-10 MED ORDER — MAGNESIUM SULFATE IN D5W 1-5 GM/100ML-% IV SOLN
1.0000 g | Freq: Once | INTRAVENOUS | Status: AC
  Administered 2020-02-10: 1 g via INTRAVENOUS
  Filled 2020-02-10: qty 100

## 2020-02-10 NOTE — ED Notes (Signed)
Dr Wilkie Aye aware Glucose 998

## 2020-02-10 NOTE — ED Provider Notes (Signed)
Jeremiah Stevens Provider Note   CSN: [id number] Arrival date & time: 02/09/20  2255     History Chief Complaint  Patient presents with  . Constipation    Jeremiah Stevens is a 44 y.o. adult.  HPI     This a 44 year old transgender male with a history of HIV, hypertension, hyperlipidemia who presents with abdominal discomfort and constipation.  Patient reports 1 month history of constipation and abdominal discomfort.  She reports that she is only had very small infrequent, hard bowel movements.  She has taken medications provided by her doctor without any results.  She reports that she is unable to eat normally and gets full very quickly.  She reports urinary urgency and frequency.  She also reports increased thirst.  Patient reports blurry vision.  Of note, she was in the waiting room at [facility] on 7/22 and was noted to have a blood sugar of 707.  She left without being seen.  Denies any history of diabetes.  Denies any recent illnesses or fevers.  Denies new medications.  Past Medical History:  Diagnosis Date  . Heartburn    occasional; OTC as needed  . Herpes genitalis in men   . HIV disease (HCC)   . Hyperlipidemia   . Hypertension    under control with med., has been on med. x 1 yr.  . Lateral malleolar fracture 09/02/2013   left  . Migraines   . Tear of deltoid ligament of left ankle 09/02/2013    Patient Active Problem List   Diagnosis Date Noted  . Non-suicidal depressed mood 07/01/2018  . Migraine 08/02/2013  . 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 Human immunodeficiency virus (HIV) disease 10/27/2011  . HTN (hypertension) 10/27/2011  . Hypertriglyceridemia 10/27/2011  . Herpes 10/27/2011    Past Surgical History:  Procedure Laterality Date  . NO PAST SURGERIES    . ORIF ANKLE FRACTURE Left 09/13/2013   Procedure: OPEN REDUCTION INTERNAL FIXATION (ORIF) LEFT LATERAL MALLEOLUS ANKLE FRACTURE ;   Surgeon: Dannielle Huh, MD;  Location: [facility];  Service: Orthopedics;  Laterality: Left;       Family History  Problem Relation Age of Onset  . Hypertension Mother     Social History   Tobacco Use  . Smoking status: Current Every Day Smoker    Packs/day: 0.50    Years: 15.00    Pack years: 7.50    Types: Cigarettes  . Smokeless tobacco: Never Used  Substance Use Topics  . Alcohol use: No  . Drug use: Yes    Frequency: 7.0 times per week    Types: Marijuana    Comment: Daily     Home Medications Prior to Admission medications   Medication Sig Start Date End Date Taking? Authorizing Provider  amLODipine (NORVASC) 10 MG tablet TAKE 1 TABLET(10 MG) BY MOUTH DAILY 12/29/19   Judyann Munson, MD  amLODipine (NORVASC) 10 MG tablet Take 1 tablet (10 mg total) by mouth daily. 01/08/20   Judyann Munson, MD  bictegravir-emtricitabine-tenofovir AF (BIKTARVY) 50-200-25 MG TABS tablet Take 1 tablet by mouth daily. 01/08/20   Judyann Munson, MD  hydrochlorothiazide (HYDRODIURIL) 25 MG tablet TAKE 1 TABLET BY MOUTH DAILY 01/08/20   Judyann Munson, MD  PARoxetine (PAXIL) 10 MG tablet Take 1 tablet (10 mg total) by mouth daily. 01/08/20   Judyann Munson, MD  valACYclovir (VALTREX) 500 MG tablet TAKE 1 TABLET(500 MG) BY MOUTH TWICE DAILY 10/25/19  Judyann MunsonSnider, Cynthia, MD    Allergies    Peanut-containing drug products and Sustiva [efavirenz]  Review of Systems   Review of Systems  Constitutional: Negative for fever.  Respiratory: Negative for shortness of breath.   Cardiovascular: Negative for chest pain.  Gastrointestinal: Positive for abdominal distention, abdominal pain, constipation and nausea. Negative for vomiting.  Endocrine: Positive for polydipsia and polyuria.  Genitourinary: Positive for frequency.  All other systems reviewed and are negative.   Physical Exam Updated Vital Signs BP (!) 159/111   Pulse (!) 106   Temp 98.4 F (36.9 C) (Oral)   Resp 21   Ht  1.753 m (5\' 9" )   Wt (!) 145.6 kg   SpO2 94%   BMI 47.40 kg/m   Physical Exam Vitals and nursing note reviewed.  Constitutional:      Appearance: She is well-developed. She is obese. She is not ill-appearing.  HENT:     Head: Normocephalic and atraumatic.     Nose: Nose normal.     Mouth/Throat:     Mouth: Mucous membranes are moist.  Eyes:     Pupils: Pupils are equal, round, and reactive to light.  Cardiovascular:     Rate and Rhythm: Normal rate and regular rhythm.     Heart sounds: Normal heart sounds.  Pulmonary:     Effort: Pulmonary effort is normal. No respiratory distress.  Abdominal:     General: Bowel sounds are normal. There is distension.     Palpations: Abdomen is soft.     Tenderness: There is no abdominal tenderness. There is no guarding or rebound.  Musculoskeletal:     Cervical back: Neck supple.     Right lower leg: No edema.     Left lower leg: No edema.  Lymphadenopathy:     Cervical: No cervical adenopathy.  Skin:    General: Skin is warm and dry.     Comments: Folliculitis bilateral inner thighs  Neurological:     Mental Status: She is alert and oriented to person, place, and time.  Psychiatric:        Mood and Affect: Mood normal.     ED Results / Procedures / Treatments   Labs (all labs ordered are listed, but only abnormal results are displayed) Labs Reviewed  COMPREHENSIVE METABOLIC PANEL - Abnormal; Notable for the following components:      Result Value   Sodium 121 (*)    Chloride 88 (*)    CO2 21 (*)    Glucose, Bld 998 (*)    Creatinine, Ser 1.45 (*)    Albumin 3.3 (*)    AST 13 (*)    GFR calc non Af Amer 59 (*)    All other components within normal limits  URINALYSIS, ROUTINE W REFLEX MICROSCOPIC - Abnormal; Notable for the following components:   Color, Urine COLORLESS (*)    Glucose, UA >=500 (*)    All other components within normal limits  OSMOLALITY - Abnormal; Notable for the following components:   Osmolality 312  (*)    All other components within normal limits  CBG MONITORING, ED - Abnormal; Notable for the following components:   Glucose-Capillary >600 (*)    All other components within normal limits  CBG MONITORING, ED - Abnormal; Notable for the following components:   Glucose-Capillary >600 (*)    All other components within normal limits  SARS CORONAVIRUS 2 BY RT PCR (HOSPITAL ORDER, PERFORMED IN [facility])  LIPASE, BLOOD  CBC    EKG None  Radiology CT ABDOMEN PELVIS W CONTRAST  Result Date: 02/10/2020 CLINICAL DATA:  Abdominal distension EXAM: CT ABDOMEN AND PELVIS WITH CONTRAST TECHNIQUE: Multidetector CT imaging of the abdomen and pelvis was performed using the standard protocol following bolus administration of intravenous contrast. CONTRAST:  OMNIPAQUE IOHEXOL 300 MG/ML  SOLN COMPARISON:  None. FINDINGS: Lower chest: The visualized heart size within normal limits. No pericardial fluid/thickening. No hiatal hernia. The visualized portions of the lungs are clear. Hepatobiliary: There is diffuse low density seen throughout the liver parenchyma. No focal hepatic lesion is seen. No intra or extrahepatic biliary ductal dilatation. The main portal vein is patent. No evidence of calcified gallstones, gallbladder wall thickening or biliary dilatation. Pancreas: Unremarkable. No pancreatic ductal dilatation or surrounding inflammatory changes. Spleen: Normal in size without focal abnormality. Adrenals/Urinary Tract: Both adrenal glands appear normal. There is a 1 cm low-density lesion seen in the lower pole the left kidney. No renal or collecting system calculi are noted. No hydronephrosis. Bladder is unremarkable. Stomach/Bowel: The stomach, small bowel, and colon are normal in appearance. No inflammatory changes, wall thickening, or obstructive findings.The appendix is normal. Vascular/Lymphatic: There are no enlarged mesenteric, retroperitoneal, or pelvic lymph nodes. No  significant vascular findings are present. Reproductive: The prostate is unremarkable. Other: No evidence of abdominal wall mass or hernia. Musculoskeletal: No acute or significant osseous findings. IMPRESSION: No acute intra-abdominal or pelvic pathology to explain the patient's symptoms. Hepatic steatosis Electronically Signed   By: Jonna Clark M.D.   On: 02/10/2020 02:32    Procedures Procedures (including critical care time)  CRITICAL CARE Performed by: Shon Baton   Total critical care time: 60 minutes  Critical care time was exclusive of separately billable procedures and treating other patients.  Critical care was necessary to treat or prevent imminent or life-threatening deterioration.  Critical care was time spent personally by me on the following activities: development of treatment plan with patient and/or surrogate as well as nursing, discussions with consultants, evaluation of patient's response to treatment, examination of patient, obtaining history from patient or surrogate, ordering and performing treatments and interventions, ordering and review of laboratory studies, ordering and review of radiographic studies, pulse oximetry and re-evaluation of patient's condition.   Medications Ordered in ED Medications  insulin regular, human (MYXREDLIN) 100 units/ 100 mL infusion (15 Units/hr Intravenous New Bag/Given (Non-Interop) 02/10/20 0303)  0.9 %  sodium chloride infusion (has no administration in time range)  dextrose 5 %-0.45 % sodium chloride infusion (0 mLs Intravenous Hold 02/10/20 0151)  dextrose 50 % solution 0-50 mL (has no administration in time range)  potassium chloride 10 mEq in 100 mL IVPB (10 mEq Intravenous New Bag/Given 02/10/20 0158)  sodium chloride flush (NS) 0.9 % injection 3 mL (3 mLs Intravenous Given 02/10/20 0148)  sodium chloride 0.9 % bolus 2,000 mL (2,000 mLs Intravenous Bolus from Bag 02/10/20 0150)  iohexol (OMNIPAQUE) 300 MG/ML solution 125 mL  (125 mLs Intravenous Contrast Given 02/10/20 0223)    ED Course  I have reviewed the triage vital signs and the nursing notes.  Pertinent labs & imaging results that were available during my care of the patient were reviewed by me and considered in my medical decision making (see chart for details).    MDM Rules/Calculators/A&P                           Patient presents with concerns for  constipation and difficulty with bowel movements over the last month.  Incidentally noted to be significantly hyperglycemic.  Se left without being seen approximately 1 week ago and was noted to have a blood sugar of 707.  Today her blood sugars greater than 900.  No evidence of DKA.  No anion gap.  Suspect new onset type 2 diabetes and hyperosmolar hyperglycemic state.  Patient started on the insulin tool.  Given 2 L of fluid.  Given abdominal complaints, will obtain CT scan to rule out obstruction or other intra-abdominal etiology.  No evidence of urinary tract infection.  She has some metabolic derangements secondary to hyperglycemia including pseudohyponatremia.  CT scan does not show any evidence of intra-abdominal pathology.  We will plan for admission for further glycemic management.  Final Clinical Impression(s) / ED Diagnoses Final diagnoses:  Hyperosmolar hyperglycemic state (HHS) (HCC)  New onset type 2 diabetes mellitus (HCC)  Constipation, unspecified constipation type    Rx / DC Orders ED Discharge Orders    None       Jennylee Uehara, Mayer Masker, MD 02/10/20 [phone]

## 2020-02-10 NOTE — H&P (Signed)
PCP:   Judyann Munson, MD   Chief Complaint: Polyuria, polydipsia   HPI: This is a 44 year old male who presents with complaints of 3 to 4 weeks of discomfort on the right side.  He states he felt his to be there is a lump that was getting bigger and bigger.  For some reason he associated this with hunger and stated that he ate as a result and he kept drinking.  He states the more he drank the more he peed.  He states he was drinking everything water, juice and it kept running through him.  He states he was constantly hungry and thirsty.  His mouth is like a cotton ball.  Additionally he has been constipated stating he has not been able to go to the bathroom in approximately a month.  He finally came to the ER 02/01/20, was noted to have sugars greater than 700 but he left without being admitted.  He returns to the ER today his glucose is greater than 900.  He has been started on Endo tool and the hospitalist have been asked to admit.  Review of Systems:  The patient denies anorexia, fever, weight loss,, vision loss, decreased hearing, hoarseness, chest pain, syncope, dyspnea on exertion, peripheral edema, balance deficits, hemoptysis, abdominal pain, melena, hematochezia, severe indigestion/heartburn, hematuria, incontinence, genital sores, muscle weakness, suspicious skin lesions, transient blindness, difficulty walking, depression, unusual weight change, abnormal bleeding, enlarged lymph nodes, angioedema, and breast masses. Positives: Polyuria, polydipsia, dehydration, constipation, abdominal pain  Past Medical History: Past Medical History:  Diagnosis Date  . Heartburn    occasional; OTC as needed  . Herpes genitalis in men   . HIV disease (HCC)   . Hyperlipidemia   . Hypertension    under control with med., has been on med. x 1 yr.  . Lateral malleolar fracture 09/02/2013   left  . Migraines   . Tear of deltoid ligament of left ankle 09/02/2013   Past Surgical History:  Procedure  Laterality Date  . NO PAST SURGERIES    . ORIF ANKLE FRACTURE Left 09/13/2013   Procedure: OPEN REDUCTION INTERNAL FIXATION (ORIF) LEFT LATERAL MALLEOLUS ANKLE FRACTURE ;  Surgeon: Dannielle Huh, MD;  Location: [facility];  Service: Orthopedics;  Laterality: Left;    Medications: Prior to Admission medications   Medication Sig Start Date End Date Taking? Authorizing Provider  amLODipine (NORVASC) 10 MG tablet TAKE 1 TABLET(10 MG) BY MOUTH DAILY 12/29/19   Judyann Munson, MD  amLODipine (NORVASC) 10 MG tablet Take 1 tablet (10 mg total) by mouth daily. 01/08/20   Judyann Munson, MD  bictegravir-emtricitabine-tenofovir AF (BIKTARVY) 50-200-25 MG TABS tablet Take 1 tablet by mouth daily. 01/08/20   Judyann Munson, MD  hydrochlorothiazide (HYDRODIURIL) 25 MG tablet TAKE 1 TABLET BY MOUTH DAILY 01/08/20   Judyann Munson, MD  PARoxetine (PAXIL) 10 MG tablet Take 1 tablet (10 mg total) by mouth daily. 01/08/20   Judyann Munson, MD  valACYclovir (VALTREX) 500 MG tablet TAKE 1 TABLET(500 MG) BY MOUTH TWICE DAILY 10/25/19   Judyann Munson, MD    Allergies:   Allergies  Allergen Reactions  . Peanut-Containing Drug Products Other (See Comments)    WALNUTS - SORES ON TONGUE  . Sustiva [Efavirenz] Rash    Social History:  reports that she has been smoking cigarettes. She has a 7.50 pack-year smoking history. She has never used smokeless tobacco. She reports current drug use. Frequency: 7.00 times per week. Drug: Marijuana. She reports that she does  not drink alcohol.  Family History: Family History  Problem Relation Age of Onset  . Hypertension Mother     Physical Exam: Vitals:   02/09/20 2307 02/09/20 2308 02/10/20 0141 02/10/20 0145  BP: (!) 139/95  (!) 147/102 (!) 159/111  Pulse: (!) 116  (!) 107 (!) 106  Resp: 18  18 21   Temp: 98.4 F (36.9 C)     TempSrc: Oral     SpO2: 95%  95% 94%  Weight:  (!) 145.6 kg    Height:  5\' 9"  (1.753 m)      General:  Alert and oriented  times three, well developed and nourished, no acute distress Eyes: PERRLA, pink conjunctiva, no scleral icterus ENT: Moist oral mucosa, neck supple, no thyromegaly Lungs: clear to ascultation, no wheeze, no crackles, no use of accessory muscles Cardiovascular: regular rate and rhythm, no regurgitation, no gallops, no murmurs. No carotid bruits, no JVD Abdomen: soft, positive BS, nonspecific tenderness to palpation, non-distended, no organomegaly, not an acute abdomen GU: not examined Neuro: CN II - XII grossly intact, sensation intact Musculoskeletal: strength 5/5 all extremities, no clubbing, cyanosis or edema Skin: no rash, no subcutaneous crepitation, no decubitus Psych: appropriate patient   Labs on Admission:  Recent Labs    02/09/20 2328  NA 121*  K 4.3  CL 88*  CO2 21*  GLUCOSE 998*  BUN 12  CREATININE 1.45*  CALCIUM 8.9   Recent Labs    02/09/20 2328  AST 13*  ALT 35  ALKPHOS 111  BILITOT 0.9  PROT 8.0  ALBUMIN 3.3*   Recent Labs    02/09/20 2328  LIPASE 30   Recent Labs    02/09/20 2328  WBC 5.9  HGB 14.8  HCT 45.2  MCV 87.3  PLT 210   No results for input(s): CKTOTAL, CKMB, CKMBINDEX, TROPONINI in the last 72 hours. Invalid input(s): POCBNP No results for input(s): DDIMER in the last 72 hours. No results for input(s): HGBA1C in the last 72 hours. No results for input(s): CHOL, HDL, LDLCALC, TRIG, CHOLHDL, LDLDIRECT in the last 72 hours. No results for input(s): TSH, T4TOTAL, T3FREE, THYROIDAB in the last 72 hours.  Invalid input(s): FREET3 No results for input(s): VITAMINB12, FOLATE, FERRITIN, TIBC, IRON, RETICCTPCT in the last 72 hours.  Micro Results: Recent Results (from the past 240 hour(s))  SARS Coronavirus 2 by RT PCR (hospital order, performed in [facility] hospital lab) Nasopharyngeal Nasopharyngeal Swab     Status: None   Collection Time: 02/10/20  1:53 AM   Specimen: Nasopharyngeal Swab  Result Value Ref Range Status   SARS  Coronavirus 2 NEGATIVE NEGATIVE Final    Comment: (NOTE) SARS-CoV-2 target nucleic acids are NOT DETECTED.  The SARS-CoV-2 RNA is generally detectable in upper and lower respiratory specimens during the acute phase of infection. The lowest concentration of SARS-CoV-2 viral copies this assay can detect is 250 copies / mL. A negative result does not preclude SARS-CoV-2 infection and should not be used as the sole basis for treatment or other patient management decisions.  A negative result may occur with improper specimen collection / handling, submission of specimen other than nasopharyngeal swab, presence of viral mutation(s) within the areas targeted by this assay, and inadequate number of viral copies (<250 copies / mL). A negative result must be combined with clinical observations, patient history, and epidemiological information.  Fact Sheet for Patients:   [facility]  Fact Sheet for Healthcare Providers: 02/12/20  This test is not yet  approved or  cleared by the [place] and has been authorized for detection and/or diagnosis of SARS-CoV-2 by FDA under an Emergency Use Authorization (EUA).  This EUA will remain in effect (meaning this test can be used) for the duration of the COVID-19 declaration under Section 564(b)(1) of the Act, 21 U.S.C. section 360bbb-3(b)(1), unless the authorization is terminated or revoked sooner.  Performed at [facility], [address] [place], [place] [address]      Radiological Exams on Admission: CT ABDOMEN PELVIS W CONTRAST  Result Date: 02/10/2020 CLINICAL DATA:  Abdominal distension EXAM: CT ABDOMEN AND PELVIS WITH CONTRAST TECHNIQUE: Multidetector CT imaging of the abdomen and pelvis was performed using the standard protocol following bolus administration of intravenous contrast. CONTRAST:  OMNIPAQUE IOHEXOL 300 MG/ML  SOLN COMPARISON:  None. FINDINGS:  Lower chest: The visualized heart size within normal limits. No pericardial fluid/thickening. No hiatal hernia. The visualized portions of the lungs are clear. Hepatobiliary: There is diffuse low density seen throughout the liver parenchyma. No focal hepatic lesion is seen. No intra or extrahepatic biliary ductal dilatation. The main portal vein is patent. No evidence of calcified gallstones, gallbladder wall thickening or biliary dilatation. Pancreas: Unremarkable. No pancreatic ductal dilatation or surrounding inflammatory changes. Spleen: Normal in size without focal abnormality. Adrenals/Urinary Tract: Both adrenal glands appear normal. There is a 1 cm low-density lesion seen in the lower pole the left kidney. No renal or collecting system calculi are noted. No hydronephrosis. Bladder is unremarkable. Stomach/Bowel: The stomach, small bowel, and colon are normal in appearance. No inflammatory changes, wall thickening, or obstructive findings.The appendix is normal. Vascular/Lymphatic: There are no enlarged mesenteric, retroperitoneal, or pelvic lymph nodes. No significant vascular findings are present. Reproductive: The prostate is unremarkable. Other: No evidence of abdominal wall mass or hernia. Musculoskeletal: No acute or significant osseous findings. IMPRESSION: No acute intra-abdominal or pelvic pathology to explain the patient's symptoms. Hepatic steatosis Electronically Signed   By: Jonna Clark M.D.   On: 02/10/2020 02:32    Assessment/Plan Present on Admission: . Type 2 diabetes mellitus with hyperosmolar nonketotic hyperglycemia (HCC)/pseudohyponatremia -Admit to progressive care -Continue endotool  -Nutrition consult placed -Serial BMPs, mag ordered. -Sodium and creatinine will likely normalize as glucose level improves and patient is hydrated.  CMP in the a.m.  Constipation -Continue of mag citrate ordered, as well as glycerin suppository -Bowel regimen recommended.  MiraLAX p.o. twice  daily -TSH ordered  Cramps -Potassium normal, mag level ordered  Tobacco use -nicotine patch,  . HIV-1 associated autonomic neuropathy (HCC) -Stable, home meds resumed  . HTN (hypertension) -Stable, home meds resumed  . Hypertriglyceridemia -lipid panel ordered -Stable, home meds resumed  . Migraine -  Transgender male Joneen Roach, Jaleeyah Munce 02/10/2020, 3:22 AM

## 2020-02-10 NOTE — Progress Notes (Signed)
PROGRESS NOTE    Jeremiah Stevens  OBS:[id number] DOB: Jul 02, 1976 DOA: 02/09/2020 PCP: Carlyle Basques, MD    Brief Narrative:  44 year old male who presents with complaints of 3 to 4 weeks of discomfort on the right side.  He states he felt his to be there is a lump that was getting bigger and bigger.  For some reason he associated this with hunger and stated that he ate as a result and he kept drinking.  He states the more he drank the more he peed.  He states he was drinking everything water, juice and it kept running through him.  He states he was constantly hungry and thirsty.  His mouth is like a cotton ball.  Additionally he has been constipated stating he has not been able to go to the bathroom in approximately a month.  He finally came to the ER 02/01/20, was noted to have sugars greater than 700 but he left without being admitted.  He returns to the ER today his glucose is greater than 900.  He has been started on Endo tool and the hospitalist have been asked to admit.  Review of Systems:  The patient denies anorexia, fever, weight loss,, vision loss, decreased hearing, hoarseness, chest pain, syncope, dyspnea on exertion, peripheral edema, balance deficits, hemoptysis, abdominal pain, melena, hematochezia, severe indigestion/heartburn, hematuria, incontinence, genital sores, muscle weakness, suspicious skin lesions, transient blindness, difficulty walking, depression, unusual weight change, abnormal bleeding, enlarged lymph nodes, angioedema, and breast masses. 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 Type 2  diabetes mellitus with hyperosmolar nonketotic hyperglycemia (HCC)/pseudohyponatremia -Admitted to progressive care -Glucose now improved with insulin drip -No prior history of diabetes although patient reports multiple family members to have diabetes -Continued on IV fluid hydration -Follow serial be met.  Anticipate transition to subcu insulin if anion gap remains closed.  Of note, serum bicarb improved to 23 -Recommend diabetic coordinator consultation  Constipation -Mag citrate ordered, as well as glycerin suppository -Continue with cathartics as needed -TSH was checked, reviewed normal at 1.238  Cramps -Continue to replace electrolytes as needed  Tobacco use -nicotine patch ordered at time of presentation  . HIV-1 associated autonomic neuropathy (HCC) -Stable, home meds resumed  . HTN (hypertension) -Stable, home meds resumed  . Hypertriglyceridemia -lipid panel ordered -Not on statin prior to admission  . Hx of Migraine -Seems to be stable at this time  Gender Identity Disorder (ICD10 F64.9)  DVT prophylaxis: Lovenox subcu Code Status: Full Family Communication: Pt in room, family not at bedside  Status is: Inpatient  Remains inpatient appropriate because:Ongoing diagnostic testing needed not appropriate for outpatient work up, IV treatments appropriate due to intensity of illness or inability to take PO and Inpatient level of care appropriate due to severity of illness   Dispo: The patient is from: Home              Anticipated d/c is to: Home              Anticipated d/c date is: 2 days              Patient currently is not medically stable to d/c.  Consultants:     Procedures:     Antimicrobials: Anti-infectives (From admission, onward)   None       Subjective: Seen early this morning, patient very drowsy able to provide very limited history.  Subjective difficult to obtain  Objective: Vitals:   02/10/20 1206 02/10/20 1211  02/10/20 1358 02/10/20 1640  BP:   (!) 139/79 (!) 146/79  Pulse: 80 95 92 99  Resp: _0 Temp:    97.9 F (36.6 C)  TempSrc:    Oral  SpO2:   96% 96%  Weight:      Height:       No intake or output data in the 24 hours ending 02/10/20 1655 Filed Weights   02/09/20 2308  Weight: (!) 145.6 kg    Examination:  General exam: Appears calm and comfortable, asleep very drowsy, difficult to fully arouse Respiratory system: Clear to auscultation. Respiratory effort normal. Cardiovascular system: S1 & S2 heard, [place] Gastrointestinal system: Abdomen is nondistended, soft and nontender. No organomegaly or masses felt. Normal bowel sounds heard. Central nervous system: Asleep but arousable. No focal neurological deficits. Extremities: Symmetric 5 x 5 power. Skin: No rashes, lesions Psychiatry: Unable to fully assess given current mentation  Data Reviewed: I have personally reviewed following labs and imaging studies  CBC: Recent Labs  Lab 02/09/20 2328  WBC 5.9  HGB 14.8  HCT 45.2  MCV 87.3  PLT 720   Basic Metabolic Panel: Recent Labs  Lab 02/09/20 2328 02/10/20 0820 02/10/20 1329  NA 121* 136 136  K 4.3 3.8 3.1*  CL 88* 103 103  CO2 21* 18* 23  GLUCOSE 998* 200* 162*  BUN _1 CREATININE 1.45* 1.12 1.03  CALCIUM 8.9 9.0 8.6*  PHOS  --  2.7  --    GFR: Estimated Creatinine Clearance (by C-G formula based on SCr of 1.03 mg/dL) Male: 109 mL/min Male: 131.7 mL/min Liver Function Tests: Recent Labs  Lab 02/09/20 2328  AST 13*  ALT 35  ALKPHOS 111  BILITOT 0.9  PROT 8.0  ALBUMIN 3.3*   Recent Labs  Lab 02/09/20 2328  LIPASE 30   No results for input(s): AMMONIA in the last 168 hours. Coagulation Profile: No results for input(s): INR, PROTIME in the last 168 hours. Cardiac Enzymes: No results for input(s): CKTOTAL, CKMB, CKMBINDEX, TROPONINI in the last 168 hours. BNP (last 3 results) No results for input(s): PROBNP in the last 8760  hours. HbA1C: No results for input(s): HGBA1C in the last 72 hours. CBG: Recent Labs  Lab 02/10/20 0742 02/10/20 0932 02/10/20 1141 02/10/20 1302 02/10/20 1504  GLUCAP 193* 193* 177* 161* 161*   Lipid Profile: Recent Labs    02/10/20 0820  CHOL 174  HDL 27*  LDLCALC 78  TRIG 343*  CHOLHDL 6.4   Thyroid Function Tests: Recent Labs    02/10/20 0820  TSH 1.238   Anemia Panel: No results for input(s): VITAMINB12, FOLATE, FERRITIN, TIBC, IRON, RETICCTPCT in the last 72 hours. Sepsis Labs: No results for input(s): PROCALCITON, LATICACIDVEN in the last 168 hours.  Recent Results (from the past 240 hour(s))  SARS Coronavirus 2 by RT PCR (hospital order, performed in [facility] hospital lab) Nasopharyngeal Nasopharyngeal Swab     Status: None   Collection Time: 02/10/20  1:53 AM   Specimen: Nasopharyngeal Swab  Result Value Ref Range Status   SARS Coronavirus 2 NEGATIVE NEGATIVE Final    Comment: (NOTE) SARS-CoV-2 target nucleic acids  are NOT DETECTED.  The SARS-CoV-2 RNA is generally detectable in upper and lower respiratory specimens during the acute phase of infection. The lowest concentration of SARS-CoV-2 viral copies this assay can detect is 250 copies / mL. A negative result does not preclude SARS-CoV-2 infection and should not be used as the sole basis for treatment or other patient management decisions.  A negative result may occur with improper specimen collection / handling, submission of specimen other than nasopharyngeal swab, presence of viral mutation(s) within the areas targeted by this assay, and inadequate number of viral copies (<250 copies / mL). A negative result must be combined with clinical observations, patient history, and epidemiological information.  Fact Sheet for Patients:   StrictlyIdeas.no  Fact Sheet for Healthcare Providers: BankingDealers.co.za  This test is not yet approved or   cleared by the [place] FDA and has been authorized for detection and/or diagnosis of SARS-CoV-2 by FDA under an Emergency Use Authorization (EUA).  This EUA will remain in effect (meaning this test can be used) for the duration of the COVID-19 declaration under Section 564(b)(1) of the Act, 21 U.S.C. section 360bbb-3(b)(1), unless the authorization is terminated or revoked sooner.  Performed at [facility], [place] [address] [place], [place] [address]      Radiology Studies: CT ABDOMEN PELVIS W CONTRAST  Result Date: 02/10/2020 CLINICAL DATA:  Abdominal distension EXAM: CT ABDOMEN AND PELVIS WITH CONTRAST TECHNIQUE: Multidetector CT imaging of the abdomen and pelvis was performed using the standard protocol following bolus administration of intravenous contrast. CONTRAST:  186m OMNIPAQUE IOHEXOL 300 MG/ML  SOLN COMPARISON:  None. FINDINGS: Lower chest: The visualized heart size within normal limits. No pericardial fluid/thickening. No hiatal hernia. The visualized portions of the lungs are clear. Hepatobiliary: There is diffuse low density seen throughout the liver parenchyma. No focal hepatic lesion is seen. No intra or extrahepatic biliary ductal dilatation. The main portal vein is patent. No evidence of calcified gallstones, gallbladder wall thickening or biliary dilatation. Pancreas: Unremarkable. No pancreatic ductal dilatation or surrounding inflammatory changes. Spleen: Normal in size without focal abnormality. Adrenals/Urinary Tract: Both adrenal glands appear normal. There is a 1 cm low-density lesion seen in the lower pole the left kidney. No renal or collecting system calculi are noted. No hydronephrosis. Bladder is unremarkable. Stomach/Bowel: The stomach, small bowel, and colon are normal in appearance. No inflammatory changes, wall thickening, or obstructive findings.The appendix is normal. Vascular/Lymphatic: There are no enlarged mesenteric, retroperitoneal, or pelvic  lymph nodes. No significant vascular findings are present. Reproductive: The prostate is unremarkable. Other: No evidence of abdominal wall mass or hernia. Musculoskeletal: No acute or significant osseous findings. IMPRESSION: No acute intra-abdominal or pelvic pathology to explain the patient's symptoms. Hepatic steatosis Electronically Signed   By: BPrudencio PairM.D.   On: 02/10/2020 02:32    Scheduled Meds: .Marland KitchenGlycerin (Adult)  1 suppository Rectal Once  . nicotine  14 mg Transdermal Daily  . polyethylene glycol  17 g Oral BID  . polyethylene glycol powder  0.5 Container Oral Once   Continuous Infusions: . sodium chloride 75 mL/hr at 02/10/20 0442  . dextrose 5 % and 0.45% NaCl Stopped (02/10/20 0151)  . insulin 15 Units/hr (02/10/20 0303)     LOS: 0 days   SMarylu Lund MD [facility] Pager On Amion  If 7PM-7AM, please contact night-coverage 02/10/2020, 4:55 PM

## 2020-02-11 LAB — COMPREHENSIVE METABOLIC PANEL
ALT: 34 U/L (ref 0–44)
AST: 26 U/L (ref 15–41)
Albumin: 3 g/dL — ABNORMAL LOW (ref 3.5–5.0)
Alkaline Phosphatase: 92 U/L (ref 38–126)
Anion gap: 10 (ref 5–15)
BUN: 9 mg/dL (ref 6–20)
CO2: 21 mmol/L — ABNORMAL LOW (ref 22–32)
Calcium: 8.5 mg/dL — ABNORMAL LOW (ref 8.9–10.3)
Chloride: 102 mmol/L (ref 98–111)
Creatinine, Ser: 1.09 mg/dL (ref 0.61–1.24)
GFR calc Af Amer: >60 mL/min (ref >60)
GFR calc non Af Amer: >60 mL/min (ref >60)
Glucose, Bld: 352 mg/dL — ABNORMAL HIGH (ref 70–99)
Potassium: 4 mmol/L (ref 3.5–5.1)
Sodium: 133 mmol/L — ABNORMAL LOW (ref 135–145)
Total Bilirubin: 0.9 mg/dL (ref 0.3–1.2)
Total Protein: 6.8 g/dL (ref 6.5–8.1)

## 2020-02-11 LAB — GLUCOSE, CAPILLARY
Glucose-Capillary: 300 mg/dL — ABNORMAL HIGH (ref 70–99)
Glucose-Capillary: 378 mg/dL — ABNORMAL HIGH (ref 70–99)
Glucose-Capillary: 303 mg/dL — ABNORMAL HIGH (ref 70–99)
Glucose-Capillary: 258 mg/dL — ABNORMAL HIGH (ref 70–99)
Glucose-Capillary: 254 mg/dL — ABNORMAL HIGH (ref 70–99)

## 2020-02-11 LAB — MAGNESIUM: Magnesium: 2.1 mg/dL (ref 1.7–2.4)

## 2020-02-11 LAB — PHOSPHORUS: Phosphorus: 2.9 mg/dL (ref 2.5–4.6)

## 2020-02-11 MED ORDER — INSULIN GLARGINE 100 UNIT/ML ~~LOC~~ SOLN
20.0000 [IU] | SUBCUTANEOUS | Status: AC
  Administered 2020-02-11: 20 [IU] via SUBCUTANEOUS
  Filled 2020-02-11: qty 0.2

## 2020-02-11 MED ORDER — ENSURE MAX PROTEIN PO LIQD
11.0000 [oz_av] | Freq: Two times a day (BID) | ORAL | Status: DC
  Administered 2020-02-11 – 2020-02-12 (×2): 11 [oz_av] via ORAL
  Filled 2020-02-11 (×4): qty 330

## 2020-02-11 MED ORDER — METOPROLOL TARTRATE 25 MG PO TABS
25.0000 mg | ORAL_TABLET | Freq: Two times a day (BID) | ORAL | Status: DC
  Administered 2020-02-11 – 2020-02-12 (×3): 25 mg via ORAL
  Filled 2020-02-11 (×3): qty 1

## 2020-02-11 MED ORDER — INSULIN ASPART 100 UNIT/ML ~~LOC~~ SOLN
10.0000 [IU] | Freq: Three times a day (TID) | SUBCUTANEOUS | Status: DC
  Administered 2020-02-11 (×2): 10 [IU] via SUBCUTANEOUS

## 2020-02-11 MED ORDER — INSULIN GLARGINE 100 UNIT/ML ~~LOC~~ SOLN
30.0000 [IU] | Freq: Every day | SUBCUTANEOUS | Status: DC
  Filled 2020-02-11: qty 0.3

## 2020-02-11 MED ORDER — INSULIN GLARGINE 100 UNIT/ML ~~LOC~~ SOLN: 25.0000 [IU] | Freq: Every day | SUBCUTANEOUS | Status: DC

## 2020-02-11 MED ORDER — INSULIN GLARGINE 100 UNIT/ML ~~LOC~~ SOLN: 15.0000 [IU] | SUBCUTANEOUS | Status: DC

## 2020-02-11 NOTE — Progress Notes (Deleted)
   02/11/20 1303  Vitals  Temp 98.6 F (37 C)  Temp Source Oral  BP (!) 155/100  MAP (mmHg) 111  BP Location Right Arm  BP Method Automatic  Patient Position (if appropriate) Lying  Pulse Rate (!) 115  MEWS COLOR  MEWS Score Color Yellow   I have noted patients's yellow MEWS score from 1303. Patient assessed and is not symptomatic, he is lying in bed talking on the phone with a family member in an excited manner and stated "oh my blood pressure is high because I'm all excited" will reasses VS in 2 hrs per protocol.

## 2020-02-11 NOTE — Discharge Instructions (Signed)

## 2020-02-11 NOTE — Progress Notes (Signed)
   02/11/20 1303  Assess: MEWS Score  Temp 98.6 F (37 C)  BP (!) 155/100  Pulse Rate (!) 115  SpO2 95 %  Assess: MEWS Score  MEWS Temp 0  MEWS Systolic 0  MEWS Pulse 2  MEWS RR 0  MEWS LOC 0  MEWS Score 2  MEWS Score Color Yellow  Assess: if the MEWS score is Yellow or Red  Were vital signs taken at a resting state? Yes  Focused Assessment No change from prior assessment  Early Detection of Sepsis Score *See Row Information* Low  MEWS guidelines implemented *See Row Information* Yes  Treat  MEWS Interventions Other (Comment) (no escalation needed)  Pain Scale 0-10  Pain Score 0  Take Vital Signs  Increase Vital Sign Frequency  Yellow: Q 2hr X 2 then Q 4hr X 2, if remains yellow, continue Q 4hrs  Escalate  MEWS: Escalate Yellow: discuss with charge nurse/RN and consider discussing with provider and RRT  Notify: Charge Nurse/RN  Name of Charge Nurse/RN Notified Shanda Bumps RN  Date Charge Nurse/RN Notified 02/11/20  Time Charge Nurse/RN Notified 1330  Document  Patient Outcome Stabilized after interventions;Other (Comment) (Pt stable, discussed stress reduction/relaxation techniques.)  Progress note created (see row info) Yes

## 2020-02-11 NOTE — Progress Notes (Signed)
PROGRESS NOTE    Jeremiah Stevens  UTM:[id number] DOB: February 12, 1976 DOA: 02/09/2020 PCP: Judyann Munson, MD    Brief Narrative:  44 year old male who presents with complaints of 3 to 4 weeks of discomfort on the right side.  He states he felt his to be there is a lump that was getting bigger and bigger.  For some reason he associated this with hunger and stated that he ate as a result and he kept drinking.  He states the more he drank the more he peed.  He states he was drinking everything water, juice and it kept running through him.  He states he was constantly hungry and thirsty.  His mouth is like a cotton ball.  Additionally he has been constipated stating he has not been able to go to the bathroom in approximately a month.  He finally came to the ER 02/01/20, was noted to have sugars greater than 700 but he left without being admitted.  He returns to the ER today his glucose is greater than 900.  He has been started on Endo tool and the hospitalist have been asked to admit.  Review of Systems:  The patient denies anorexia, fever, weight loss,, vision loss, decreased hearing, hoarseness, chest pain, syncope, dyspnea on exertion, peripheral edema, balance deficits, hemoptysis, abdominal pain, melena, hematochezia, severe indigestion/heartburn, hematuria, incontinence, genital sores, muscle weakness, suspicious skin lesions, transient blindness, difficulty walking, depression, unusual weight change, abnormal bleeding, enlarged lymph nodes, angioedema, and breast masses. Positives: Polyuria, polydipsia, dehydration, constipation, abdominal pain   Assessment & Plan:   Principal Problem:   Type 2 diabetes mellitus with hyperosmolar nonketotic hyperglycemia (HCC) Active Problems:   HTN (hypertension)   Hypertriglyceridemia   Migraine   HIV-1 associated autonomic neuropathy (HCC)   Hyperglycemic hyperosmolar nonketotic coma (HCC)   Gender identity disorder  Present on Admission:  Type 2  diabetes mellitus with hyperosmolar nonketotic hyperglycemia (HCC)/pseudohyponatremia -Admitted to progressive care -Glucose now improved s/p insulin drip -No prior history of diabetes although patient reports multiple family members to have diabetes -Gap remains closed -Glucose remains very poorly controlled while on 10 units of Lantus with sliding scale insulin -Increase Lantus to 30 units daily and add 10 units of meal coverage -Have consulted diabetic coordinator -Recommend diabetic coordinator consultation  Constipation -Mag citrate ordered, as well as glycerin suppository at time of presentation -Continue with cathartics as needed -TSH was checked, reviewed normal at 1.238  Cramps -Continue to replace electrolytes as needed  Tobacco use -nicotine patch ordered at time of presentation   HIV-1 associated autonomic neuropathy (HCC) -Stable, home meds resumed   HTN (hypertension) -Stable, home meds resumed   Hypertriglyceridemia -lipid panel ordered -Not on statin prior to admission   Hx of Migraine -Seems to be stable at this time  Gender Identity Disorder (ICD10 F64.9)  Tachycardia -Patient with heart rate into the110's this afternoon. -Blood pressure suboptimally controlled.  Have added scheduled metoprolol with hold parameters  Possible OSA -Patient reports being given diagnosis of OSA prior to this hospital visit, although patient has not had formal sleep study -We will give trial of CPAP while in hospital -Recommend outpatient sleep study at time of discharge  DVT prophylaxis: Lovenox subcu Code Status: Full Family Communication: Pt in room, family not at bedside  Status is: Inpatient  Remains inpatient appropriate because:Ongoing diagnostic testing needed not appropriate for outpatient work up, IV treatments appropriate due to intensity of illness or inability to take PO and Inpatient level of care appropriate  due to severity of  illness   Dispo: The patient is from: Home              Anticipated d/c is to: Home              Anticipated d/c date is: 2 days              Patient currently is not medically stable to d/c.    Consultants:     Procedures:     Antimicrobials: Anti-infectives (From admission, onward)   Start     Dose/Rate Route Frequency Ordered Stop   02/10/20 2200  valACYclovir (VALTREX) tablet 500 mg     Discontinue    Note to Pharmacy: TAKE 1 TABLET(500 MG) BY MOUTH TWICE DAILY     500 mg Oral 2 times daily 02/10/20 1701     02/10/20 1830  bictegravir-emtricitabine-tenofovir AF (BIKTARVY) 50-200-25 MG per tablet 1 tablet     Discontinue     1 tablet Oral Daily 02/10/20 1701        Subjective: Remains quite drowsy, difficult to obtain subjective history  Objective: Vitals:   02/11/20 0512 02/11/20 0839 02/11/20 0852 02/11/20 1303  BP: (!) 156/112 (!) 153/90 (!) 163/116 (!) 155/100  Pulse: 100 98  (!) 115  Resp: 20     Temp: 98.6 F (37 C) 98.9 F (37.2 C)  98.6 F (37 C)  TempSrc:  Oral  Oral  SpO2: (!) 81% 95%  95%  Weight: (!) 135.5 kg     Height:        Intake/Output Summary (Last 24 hours) at 02/11/2020 1428 Last data filed at 02/11/2020 6060 Gross per 24 hour  Intake 247.29 ml  Output 350 ml  Net -102.71 ml   Filed Weights   02/09/20 2308 02/10/20 1659 02/11/20 0512  Weight: (!) 145.6 kg (!) 134.9 kg (!) 135.5 kg    Examination: General exam: Asleep, difficult to keep awake, laying in bed, in nad Respiratory system: Normal respiratory effort, no wheezing Cardiovascular system: regular rate, s1, s2 Gastrointestinal system: Soft, nondistended, positive BS Central nervous system: CN2-12 grossly intact, strength intact Extremities: Perfused, no clubbing Skin: Normal skin turgor, no notable skin lesions seen Psychiatry: Difficult to assess given lethargy  Data Reviewed: I have personally reviewed following labs and imaging studies  CBC: Recent Labs  Lab  02/09/20 2328 02/10/20 1725  WBC 5.9 4.6  HGB 14.8 15.5  HCT 45.2 45.7  MCV 87.3 87.9  PLT 210 202   Basic Metabolic Panel: Recent Labs  Lab 02/10/20 0820 02/10/20 1329 02/10/20 1725 02/10/20 2207 02/11/20 0259 02/11/20 0811  NA 136 136 137 136 133*  --   K 3.8 3.1* 3.5 3.1* 4.0  --   CL 103 103 105 102 102  --   CO2 18* 23 23 25  21*  --   GLUCOSE 200* 162* 124* 250* 352*  --   BUN 10 9 9 9 9   --   CREATININE 1.12 1.03 1.08 0.94 1.09  --   CALCIUM 9.0 8.6* 8.6* 8.5* 8.5*  --   MG  --   --  2.1 2.1  --  2.1  PHOS 2.7  --  2.7 3.0  --  2.9   GFR: Estimated Creatinine Clearance (by C-G formula based on SCr of 1.09 mg/dL) Male: mL/min Male: 124.6 mL/min Liver Function Tests: Recent Labs  Lab 02/09/20 2328 02/11/20 0259  AST 13* 26  ALT 35 34  ALKPHOS 111 92  BILITOT 0.9 0.9  PROT 8.0 6.8  ALBUMIN 3.3* 3.0*   Recent Labs  Lab 02/09/20 2328  LIPASE 30   No results for input(s): AMMONIA in the last 168 hours. Coagulation Profile: No results for input(s): INR, PROTIME in the last 168 hours. Cardiac Enzymes: No results for input(s): CKTOTAL, CKMB, CKMBINDEX, TROPONINI in the last 168 hours. BNP (last 3 results) No results for input(s): PROBNP in the last 8760 hours. HbA1C: No results for input(s): HGBA1C in the last 72 hours. CBG: Recent Labs  Lab 02/10/20 2157 02/10/20 2256 02/11/20 0809 02/11/20 1117 02/11/20 1302  GLUCAP 231* 291* 300* 378* 303*   Lipid Profile: Recent Labs    02/10/20 0820  CHOL 174  HDL 27*  LDLCALC 78  TRIG [id number]*  CHOLHDL 6.4   Thyroid Function Tests: Recent Labs    02/10/20 0820  TSH 1.238   Anemia Panel: No results for input(s): VITAMINB12, FOLATE, FERRITIN, TIBC, IRON, RETICCTPCT in the last 72 hours. Sepsis Labs: No results for input(s): PROCALCITON, LATICACIDVEN in the last 168 hours.  Recent Results (from the past 240 hour(s))  SARS Coronavirus 2 by RT PCR (hospital order, performed in [facility]  hospital lab) Nasopharyngeal Nasopharyngeal Swab     Status: None   Collection Time: 02/10/20  1:53 AM   Specimen: Nasopharyngeal Swab  Result Value Ref Range Status   SARS Coronavirus 2 NEGATIVE NEGATIVE Final    Comment: (NOTE) SARS-CoV-2 target nucleic acids are NOT DETECTED.  The SARS-CoV-2 RNA is generally detectable in upper and lower respiratory specimens during the acute phase of infection. The lowest concentration of SARS-CoV-2 viral copies this assay can detect is 250 copies / mL. A negative result does not preclude SARS-CoV-2 infection and should not be used as the sole basis for treatment or other patient management decisions.  A negative result may occur with improper specimen collection / handling, submission of specimen other than nasopharyngeal swab, presence of viral mutation(s) within the areas targeted by this assay, and inadequate number of viral copies (<250 copies / mL). A negative result must be combined with clinical observations, patient history, and epidemiological information.  Fact Sheet for Patients:   BoilerBrush.com.cyhttps://www.fda.gov/media/136312/download  Fact Sheet for Healthcare Providers: https://pope.com/https://www.fda.gov/media/136313/download  This test is not yet approved or  cleared by the [place] FDA and has been authorized for detection and/or diagnosis of SARS-CoV-2 by FDA under an Emergency Use Authorization (EUA).  This EUA will remain in effect (meaning this test can be used) for the duration of the COVID-19 declaration under Section 564(b)(1) of the Act, 21 U.S.C. section 360bbb-3(b)(1), unless the authorization is terminated or revoked sooner.  Performed at [facility], [address] [place], [place] [address]      Radiology Studies: CT ABDOMEN PELVIS W CONTRAST  Result Date: 02/10/2020 CLINICAL DATA:  Abdominal distension EXAM: CT ABDOMEN AND PELVIS WITH CONTRAST TECHNIQUE: Multidetector CT imaging of the abdomen and pelvis was performed  using the standard protocol following bolus administration of intravenous contrast. CONTRAST:  125mL OMNIPAQUE IOHEXOL 300 MG/ML  SOLN COMPARISON:  None. FINDINGS: Lower chest: The visualized heart size within normal limits. No pericardial fluid/thickening. No hiatal hernia. The visualized portions of the lungs are clear. Hepatobiliary: There is diffuse low density seen throughout the liver parenchyma. No focal hepatic lesion is seen. No intra or extrahepatic biliary ductal dilatation. The main portal vein is patent. No evidence of calcified gallstones, gallbladder wall thickening or biliary dilatation. Pancreas: Unremarkable. No pancreatic ductal dilatation  or surrounding inflammatory changes. Spleen: Normal in size without focal abnormality. Adrenals/Urinary Tract: Both adrenal glands appear normal. There is a 1 cm low-density lesion seen in the lower pole the left kidney. No renal or collecting system calculi are noted. No hydronephrosis. Bladder is unremarkable. Stomach/Bowel: The stomach, small bowel, and colon are normal in appearance. No inflammatory changes, wall thickening, or obstructive findings.The appendix is normal. Vascular/Lymphatic: There are no enlarged mesenteric, retroperitoneal, or pelvic lymph nodes. No significant vascular findings are present. Reproductive: The prostate is unremarkable. Other: No evidence of abdominal wall mass or hernia. Musculoskeletal: No acute or significant osseous findings. IMPRESSION: No acute intra-abdominal or pelvic pathology to explain the patient's symptoms. Hepatic steatosis Electronically Signed   By: Jonna Clark M.D.   On: 02/10/2020 02:32    Scheduled Meds:  amLODipine  10 mg Oral Daily   bictegravir-emtricitabine-tenofovir AF  1 tablet Oral Daily   enoxaparin (LOVENOX) injection  40 mg Subcutaneous Q24H   Glycerin (Adult)  1 suppository Rectal Once   insulin aspart  0-15 Units Subcutaneous TID WC   insulin aspart  0-5 Units Subcutaneous QHS    insulin aspart  10 Units Subcutaneous TID WC   [START ON 02/12/2020] insulin glargine  30 Units Subcutaneous Daily   metoprolol tartrate  25 mg Oral BID   multivitamin with minerals  1 tablet Oral Daily   nicotine  14 mg Transdermal Daily   PARoxetine  10 mg Oral Daily   polyethylene glycol  17 g Oral BID   Ensure Max Protein  11 oz Oral BID   valACYclovir  500 mg Oral BID   Continuous Infusions:  sodium chloride 75 mL/hr at 02/10/20 0442   insulin Stopped (02/10/20 2230)     LOS: 1 day   Rickey Barbara, MD [facility] Pager On Amion  If 7PM-7AM, please contact night-coverage 02/11/2020, 2:28 PM

## 2020-02-11 NOTE — Progress Notes (Signed)
Initial Nutrition Assessment  RD working remotely.  DOCUMENTATION CODES:   Morbid obesity  INTERVENTION:   - Provided diabetes diet education, handouts attached to AVS/Discharge Instructions  - Continue MVI with minerals daily  - Ensure Max po BID, each supplement provides 150 kcal and 30 grams of protein  NUTRITION DIAGNOSIS:   Inadequate oral intake related to nausea, early satiety as evidenced by per patient/family report.  GOAL:   Patient will meet greater than or equal to 90% of their needs  MONITOR:   PO intake, Supplement acceptance, Labs, Weight trends, I & O's  REASON FOR ASSESSMENT:   Consult Diet education  ASSESSMENT:   44 year old transgender male who presents with complaints of 3 to 4 weeks of discomfort and growing lump on abodomen. Pt also reports constipation and states has not been able to go to the bathroom in approximately a month. Pt presented to the ED on 02/01/20 and was noted to have blood glucose greater than 700 but left without being admitted. Pt returned to the ED on 7/30 with glucose greater than 900. PMH of HIV, HLD, HTN. Pt admitted with T2DM.   RD received consult for diet education regarding new onset T2DM.  RD has attached "Carbohydrate Counting for People with Diabetes" handout from the Academy of Nutrition [employer] Dietetics to pt's AVS/Dischrage Instructions. Discussed different food groups and their effects on blood sugar, emphasizing carbohydrate-containing foods. Provided list of carbohydrates and recommended serving sizes of common foods.  Discussed importance of controlled and consistent carbohydrate intake throughout the day. Provided examples of ways to balance meals/snacks and encouraged intake of high-fiber, whole grain complex carbohydrates. Teach back method used.  Pt reports being familiar with DM as multiple family members have it. Pt remembers grandmother using sugar substitutes when making fruit punch or in her  coffee.  Discussed pt's PO intake over the last several weeks. Pt reports not having had a BM in 4 weeks. Pt reports that during this time she has only been able to consume soups, broth, juice, milk, and water. Pt states that she has not been able to eat solid food but later reports that she consumed "a whole wheel of fruit" PTA. Pt reports that the "mass" in/on her stomach expands when she eats and that she "fills up instantly" when trying to eat. Pt refers to mass as "a blockage." Pt reports that she has tried Miralax without success. Pt endorses vomiting at home and reports having some nausea now but no vomiting.  Per CT abdomen and pelvis performed yesterday, "no acute intra-abdominal or pelvic pathology to explain the patient's symptoms."  Pt states that she has been able to consume meals since admission without issue. Pt eating lunch during RD phone call.  Pt denies any recent weight loss but reports that if the "mass" were not there, she probably did lose some weight. Weight down 10 kg since 06/21/19. This is a 6.9% weight loss in 8 months which is not significant for timeframe.  RD will order Ensure Max oral nutrition supplements to aid pt in meeting kcal and protein needs during admission.  Medications reviewed and include: SSI, Novolog 10 units TID, Lantus 30 units daily, MVI with minerals, miralax  Labs reviewed: sodium 133 CBG's: 128-378 x 24 hours  NUTRITION - FOCUSED PHYSICAL EXAM:  Unable to complete at this time. RD working remotely.  Diet Order:   Diet Order            Diet Carb Modified Fluid consistency: Thin; Room  service appropriate? Yes  Diet effective now                 EDUCATION NEEDS:   Education needs have been addressed  Skin:  Skin Assessment: Reviewed RN Assessment  Last BM:  no documented BM  Height:   Ht Readings from Last 1 Encounters:  02/10/20 6' (1.829 m)    Weight:   Wt Readings from Last 1 Encounters:  02/11/20 (!) 135.5 kg     BMI:  Body mass index is 40.51 kg/m.  Estimated Nutritional Needs:   Kcal:  2300-2500  Protein:  115-130 grams  Fluid:  >/= 2.0 L    Earma Reading, MS, RD, LDN Inpatient Clinical Dietitian Please see AMiON for contact information.

## 2020-02-11 NOTE — Progress Notes (Signed)
Pt refused cpap. Spoke to nurse, told her to call me if hen decides he wants it during the night.

## 2020-02-12 ENCOUNTER — Encounter (HOSPITAL_COMMUNITY): Payer: Self-pay | Admitting: Family Medicine

## 2020-02-12 LAB — GLUCOSE, CAPILLARY
Glucose-Capillary: 236 mg/dL — ABNORMAL HIGH (ref 70–99)
Glucose-Capillary: 331 mg/dL — ABNORMAL HIGH (ref 70–99)

## 2020-02-12 LAB — COMPREHENSIVE METABOLIC PANEL
ALT: 32 U/L (ref 0–44)
AST: 22 U/L (ref 15–41)
Albumin: 3 g/dL — ABNORMAL LOW (ref 3.5–5.0)
Alkaline Phosphatase: 89 U/L (ref 38–126)
Anion gap: 9 (ref 5–15)
BUN: 9 mg/dL (ref 6–20)
CO2: 22 mmol/L (ref 22–32)
Calcium: 8.9 mg/dL (ref 8.9–10.3)
Chloride: 103 mmol/L (ref 98–111)
Creatinine, Ser: 0.99 mg/dL (ref 0.61–1.24)
GFR calc Af Amer: >60 mL/min (ref >60)
GFR calc non Af Amer: >60 mL/min (ref >60)
Glucose, Bld: 289 mg/dL — ABNORMAL HIGH (ref 70–99)
Potassium: 4 mmol/L (ref 3.5–5.1)
Sodium: 134 mmol/L — ABNORMAL LOW (ref 135–145)
Total Bilirubin: 0.8 mg/dL (ref 0.3–1.2)
Total Protein: 7.1 g/dL (ref 6.5–8.1)

## 2020-02-12 LAB — HEMOGLOBIN A1C
Hgb A1c MFr Bld: >15.5 % — ABNORMAL HIGH (ref 4.8–5.6)
Hgb A1c MFr Bld: >15.5 % — ABNORMAL HIGH (ref 4.8–5.6)
Mean Plasma Glucose: >398 mg/dL
Mean Plasma Glucose: >398 mg/dL

## 2020-02-12 MED ORDER — INSULIN ASPART 100 UNIT/ML FLEXPEN: 15.0000 [IU] | PEN_INJECTOR | Freq: Three times a day (TID) | SUBCUTANEOUS | 0 refills | Status: DC

## 2020-02-12 MED ORDER — INSULIN ASPART 100 UNIT/ML ~~LOC~~ SOLN
12.0000 [IU] | Freq: Three times a day (TID) | SUBCUTANEOUS | Status: DC
  Administered 2020-02-12 (×2): 12 [IU] via SUBCUTANEOUS

## 2020-02-12 MED ORDER — INSULIN PEN NEEDLE 32G X 4 MM MISC: 1.0000 | Freq: Four times a day (QID) | 0 refills | Status: DC

## 2020-02-12 MED ORDER — INSULIN GLARGINE 100 UNIT/ML SOLOSTAR PEN: 45.0000 [IU] | PEN_INJECTOR | Freq: Every day | SUBCUTANEOUS | 0 refills | Status: DC

## 2020-02-12 MED ORDER — INSULIN STARTER KIT- PEN NEEDLES (ENGLISH)
1.0000 | Freq: Once | Status: AC
  Administered 2020-02-12: 1
  Filled 2020-02-12: qty 1

## 2020-02-12 MED ORDER — METOPROLOL TARTRATE 25 MG PO TABS: 25.0000 mg | ORAL_TABLET | Freq: Two times a day (BID) | ORAL | 0 refills | Status: DC

## 2020-02-12 MED ORDER — LIVING WELL WITH DIABETES BOOK
Freq: Once | Status: AC
  Filled 2020-02-12: qty 1

## 2020-02-12 MED ORDER — INSULIN GLARGINE 100 UNIT/ML ~~LOC~~ SOLN
40.0000 [IU] | Freq: Every day | SUBCUTANEOUS | Status: DC
  Administered 2020-02-12: 40 [IU] via SUBCUTANEOUS
  Filled 2020-02-12: qty 0.4

## 2020-02-12 MED ORDER — BLOOD GLUCOSE MONITOR KIT: PACK | 0 refills | Status: DC

## 2020-02-12 MED FILL — NOVOLOG FLEXPEN SYRINGE: 100 | 27 days supply | Qty: 12 | Fill #0

## 2020-02-12 MED FILL — LANTUS SOLOSTAR 100 UNITS/M: 100 | 33 days supply | Qty: 15 | Fill #0

## 2020-02-12 MED FILL — METOPROLOL TARTRATE 25 MG T: 25 | 30 days supply | Qty: 60 | Fill #0

## 2020-02-12 MED FILL — PENTIPS 32G X 4 MM MISC: 32G X 4 MM | 25 days supply | Qty: 100 | Fill #0

## 2020-02-12 NOTE — Discharge Summary (Signed)
Physician Discharge Summary  Jeremiah Stevens QMV:[id number] DOB: 1976-04-29 DOA: 02/09/2020  PCP: Carlyle Basques, MD  Admit date: 02/09/2020 Discharge date: 02/12/2020  Admitted From: home Disposition:  home  Recommendations for Outpatient Follow-up:  1. Follow up with PCP in 1-2 weeks 2. Recommend outpatient sleep study  Discharge Condition:Improved CODE STATUS:Full Diet recommendation: Diabetic   Brief/Interim Summary: 44 year old male who presents with complaints of 3 to 4 weeks of discomfort on the right side. He states he felt his to be there is a lump that was getting bigger and bigger. For some reason he associated this with hunger and stated that he ate as a result and he kept drinking. He states the more he drank the more he peed. He states he was drinking everything water, juice and it kept running through him. He states he was constantly hungry and thirsty. His mouth is like a cotton ball. Additionally he has been constipated stating he has not been able to go to the bathroom in approximately a month. He finally came to the ER 02/01/20, was noted to have sugars greater than 700 but he left without being admitted. He returns to the ER today his glucose is greater than 900.He has been started on Endo tool and the hospitalist have been asked to admit.  Review of Systems:  The patient denies anorexia, fever, weight loss,, vision loss, decreased hearing, hoarseness, chest pain, syncope, dyspnea on exertion, peripheral edema, balance deficits, hemoptysis, abdominal pain, melena, hematochezia, severe indigestion/heartburn, hematuria, incontinence, genital sores, muscle weakness, suspicious skin lesions, transient blindness, difficulty walking, depression, unusual weight change, abnormal bleeding, enlarged lymph nodes, angioedema, and breast masses. 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 Recommendation for discharging on 45 units lantus, 15 units meal coverage -Will need close outpatient follow up  Constipation -Resolved with cathartics -TSH was checked, reviewed normal at 1.238  Cramps -Continue to replace electrolytes as needed  Tobacco use -nicotine patch ordered at time of presentation  .HIV-1 associated autonomic neuropathy (HCC) -Stable, home meds resumed  .HTN (hypertension) -Stable, home meds resumed  .Hypertriglyceridemia -lipid panelordered -Not on statin prior to admission  .Hx of Migraine -Seems to be stable at this time  Gender Identity Disorder (ICD10 F64.9)  Tachycardia -Patient with heart rate into the110's this afternoon. -Blood pressure suboptimally controlled.  Have added scheduled metoprolol with hold parameters -Resolved  Possible OSA -Patient reports being given diagnosis of OSA prior to this hospital visit, although patient has not had formal sleep study -We will give trial of CPAP while in hospital -Recommend outpatient sleep study at time of discharge   Discharge Instructions   Allergies as of 02/12/2020      Reactions   Peanut-containing Drug Products Other (See Comments)   WALNUTS - SORES ON TONGUE   Sustiva [efavirenz] Rash       Medication List    TAKE these medications   amLODipine  10 MG tablet Commonly known as: NORVASC TAKE 1 TABLET(10 MG) BY MOUTH DAILY What changed: See the new instructions.   bictegravir-emtricitabine-tenofovir AF 50-200-25 MG Tabs tablet Commonly known as: BIKTARVY Take 1 tablet by mouth daily.   blood glucose meter kit and supplies Kit Dispense based on patient and insurance preference. Use up to four times daily as directed. (FOR ICD-9 250.00, 250.01).   hydrochlorothiazide 25 MG tablet Commonly known as: HYDRODIURIL TAKE 1 TABLET BY MOUTH DAILY What changed:   how much to take  how to take this  when to take this  additional instructions   insulin aspart 100 UNIT/ML FlexPen Commonly known as: NOVOLOG Inject 15 Units into the skin 3 (three) times daily with meals.   insulin glargine 100 UNIT/ML Solostar Pen Commonly known as: LANTUS Inject 45 Units into the skin daily.   Insulin Pen Needle 32G X 4 MM Misc 1 Device by Does not apply route QID. For use with insulin pens   metoprolol tartrate 25 MG tablet Commonly known as: LOPRESSOR Take 1 tablet (25 mg total) by mouth 2 (two) times daily.   PARoxetine 10 MG tablet Commonly known as: Paxil Take 1 tablet (10 mg total) by mouth daily.   polyethylene glycol 17 g packet Commonly known as: MIRALAX / GLYCOLAX Take 17 g by mouth daily as needed for moderate constipation.   senna 8.6 MG Tabs tablet Commonly known as: SENOKOT Take 1 tablet by mouth daily as needed for mild constipation.   valACYclovir 500 MG tablet Commonly known as: VALTREX TAKE 1 TABLET(500 MG) BY MOUTH TWICE DAILY What changed:   how much to take  how to take this  when to take this  additional instructions       Follow-up Information    Carlyle Basques, MD. Schedule an appointment as soon as possible for a visit in 2 week(s).   Specialty: Infectious Diseases Contact information: [address] [place]  [phone]              Allergies  Allergen Reactions  . Peanut-Containing Drug Products Other (See Comments)    WALNUTS - SORES ON TONGUE  . Sustiva [Efavirenz] Rash    Procedures/Studies: CT ABDOMEN PELVIS W CONTRAST  Result Date: 02/10/2020 CLINICAL DATA:  Abdominal distension EXAM: CT ABDOMEN AND PELVIS WITH CONTRAST TECHNIQUE: Multidetector CT imaging of the abdomen and pelvis was performed using the standard protocol following bolus administration of intravenous contrast. CONTRAST:  14m OMNIPAQUE IOHEXOL 300 MG/ML  SOLN COMPARISON:  None. FINDINGS: Lower chest: The visualized heart size within normal limits. No pericardial fluid/thickening. No hiatal hernia. The visualized portions of the lungs are clear. Hepatobiliary: There is diffuse low density seen throughout the liver parenchyma. No focal hepatic lesion is seen. No intra or extrahepatic biliary ductal dilatation. The main portal vein is patent. No evidence of calcified gallstones, gallbladder wall thickening or biliary dilatation. Pancreas: Unremarkable. No pancreatic ductal dilatation or surrounding inflammatory changes. Spleen: Normal in size without focal abnormality. Adrenals/Urinary Tract: Both adrenal glands appear normal. There is a 1 cm low-density lesion seen in the lower pole the left kidney. No renal or collecting system calculi are noted. No hydronephrosis. Bladder is unremarkable. Stomach/Bowel: The stomach, small bowel, and colon are normal in appearance. No inflammatory changes, wall thickening, or obstructive findings.The appendix is normal. Vascular/Lymphatic: There are no enlarged mesenteric, retroperitoneal, or pelvic lymph nodes. No significant vascular findings are present. Reproductive: The prostate is unremarkable. Other: No evidence  of abdominal wall mass or hernia. Musculoskeletal: No acute or significant osseous findings. IMPRESSION: No acute intra-abdominal or pelvic pathology to explain the  patient's symptoms. Hepatic steatosis Electronically Signed   By: Prudencio Pair M.D.   On: 02/10/2020 02:32     Subjective: Eager to go home today  Discharge Exam: Vitals:   02/12/20 0957 02/12/20 1035  BP:  (!) 158/91  Pulse: 100 97  Resp:  20  Temp:  98.1 F (36.7 C)  SpO2:  96%   Vitals:   02/12/20 0410 02/12/20 0844 02/12/20 0957 02/12/20 1035  BP: (!) 143/85 (!) 162/79  (!) 158/91  Pulse: 92 92 100 97  Resp: 18   20  Temp: 98.1 F (36.7 C) 98.2 F (36.8 C)  98.1 F (36.7 C)  TempSrc: Oral Oral  Oral  SpO2: 100% 99%  96%  Weight: (!) 136.1 kg     Height:        General: Pt is alert, awake, not in acute distress Cardiovascular: RRR, S1/S2 +, no rubs, no gallops Respiratory: CTA bilaterally, no wheezing, no rhonchi Abdominal: Soft, NT, ND, bowel sounds + Extremities: no edema, no cyanosis   The results of significant diagnostics from this hospitalization (including imaging, microbiology, ancillary and laboratory) are listed below for reference.     Microbiology: Recent Results (from the past 240 hour(s))  SARS Coronavirus 2 by RT PCR (hospital order, performed in [facility] hospital lab) Nasopharyngeal Nasopharyngeal Swab     Status: None   Collection Time: 02/10/20  1:53 AM   Specimen: Nasopharyngeal Swab  Result Value Ref Range Status   SARS Coronavirus 2 NEGATIVE NEGATIVE Final    Comment: (NOTE) SARS-CoV-2 target nucleic acids are NOT DETECTED.  The SARS-CoV-2 RNA is generally detectable in upper and lower respiratory specimens during the acute phase of infection. The lowest concentration of SARS-CoV-2 viral copies this assay can detect is 250 copies / mL. A negative result does not preclude SARS-CoV-2 infection and should not be used as the sole basis for treatment or other patient management decisions.  A negative result may occur with improper specimen collection / handling, submission of specimen other than nasopharyngeal swab, presence of viral  mutation(s) within the areas targeted by this assay, and inadequate number of viral copies (<250 copies / mL). A negative result must be combined with clinical observations, patient history, and epidemiological information.  Fact Sheet for Patients:   StrictlyIdeas.no  Fact Sheet for Healthcare Providers: BankingDealers.co.za  This test is not yet approved or  cleared by the [place] FDA and has been authorized for detection and/or diagnosis of SARS-CoV-2 by FDA under an Emergency Use Authorization (EUA).  This EUA will remain in effect (meaning this test can be used) for the duration of the COVID-19 declaration under Section 564(b)(1) of the Act, 21 U.S.C. section 360bbb-3(b)(1), unless the authorization is terminated or revoked sooner.  Performed at [facility], [place] [address] [place], [place] [address]      Labs: BNP (last 3 results) No results for input(s): BNP in the last 8760 hours. Basic Metabolic Panel: Recent Labs  Lab 02/10/20 0820 02/10/20 0820 02/10/20 1329 02/10/20 1725 02/10/20 2207 02/11/20 0259 02/11/20 0811 02/12/20 0326  NA 136   < > 136 137 136 133*  --  134*  K 3.8   < > 3.1* 3.5 3.1* 4.0  --  4.0  CL 103   < > 103 105 102 102  --  103  CO2  18*   < > '23 23 25 ' 21*  --  22  GLUCOSE 200*   < > 162* 124* 250* 352*  --  289*  BUN 10   < > '9 9 9 9  ' --  9  CREATININE 1.12   < > 1.03 1.08 0.94 1.09  --  0.99  CALCIUM 9.0   < > 8.6* 8.6* 8.5* 8.5*  --  8.9  MG  --   --   --  2.1 2.1  --  2.1  --   PHOS 2.7  --   --  2.7 3.0  --  2.9  --    < > = values in this interval not displayed.   Liver Function Tests: Recent Labs  Lab 02/09/20 2328 02/11/20 0259 02/12/20 0326  AST 13* 26 22  ALT 35 34 32  ALKPHOS 111 92 89  BILITOT 0.9 0.9 0.8  PROT 8.0 6.8 7.1  ALBUMIN 3.3* 3.0* 3.0*   Recent Labs  Lab 02/09/20 2328  LIPASE 30   No results for input(s): AMMONIA in the last 168  hours. CBC: Recent Labs  Lab 02/09/20 2328 02/10/20 1725  WBC 5.9 4.6  HGB 14.8 15.5  HCT 45.2 45.7  MCV 87.3 87.9  PLT 210 202   Cardiac Enzymes: No results for input(s): CKTOTAL, CKMB, CKMBINDEX, TROPONINI in the last 168 hours. BNP: Invalid input(s): POCBNP CBG: Recent Labs  Lab 02/11/20 1302 02/11/20 1621 02/11/20 2200 02/12/20 0842 02/12/20 1221  GLUCAP 303* 258* 254* 236* 331*   D-Dimer No results for input(s): DDIMER in the last 72 hours. Hgb A1c Recent Labs    02/09/20 2328 02/11/20 0300  HGBA1C >15.5* >15.5*   Lipid Profile Recent Labs    02/10/20 0820  CHOL 174  HDL 27*  LDLCALC 78  TRIG 343*  CHOLHDL 6.4   Thyroid function studies Recent Labs    02/10/20 0820  TSH 1.238   Anemia work up No results for input(s): VITAMINB12, FOLATE, FERRITIN, TIBC, IRON, RETICCTPCT in the last 72 hours. Urinalysis    Component Value Date/Time   COLORURINE COLORLESS (A) 02/09/2020 2309   APPEARANCEUR CLEAR 02/09/2020 2309   LABSPEC 1.025 02/09/2020 2309   PHURINE 5.0 02/09/2020 2309   GLUCOSEU >=500 (A) 02/09/2020 2309   HGBUR NEGATIVE 02/09/2020 2309   BILIRUBINUR NEGATIVE 02/09/2020 2309   KETONESUR NEGATIVE 02/09/2020 2309   PROTEINUR NEGATIVE 02/09/2020 2309   UROBILINOGEN 0.2 10/08/2011 1145   NITRITE NEGATIVE 02/09/2020 2309   LEUKOCYTESUR NEGATIVE 02/09/2020 2309   Sepsis Labs Invalid input(s): PROCALCITONIN,  WBC,  LACTICIDVEN Microbiology Recent Results (from the past 240 hour(s))  SARS Coronavirus 2 by RT PCR (hospital order, performed in [facility] hospital lab) Nasopharyngeal Nasopharyngeal Swab     Status: None   Collection Time: 02/10/20  1:53 AM   Specimen: Nasopharyngeal Swab  Result Value Ref Range Status   SARS Coronavirus 2 NEGATIVE NEGATIVE Final    Comment: (NOTE) SARS-CoV-2 target nucleic acids are NOT DETECTED.  The SARS-CoV-2 RNA is generally detectable in upper and lower respiratory specimens during the acute phase of  infection. The lowest concentration of SARS-CoV-2 viral copies this assay can detect is 250 copies / mL. A negative result does not preclude SARS-CoV-2 infection and should not be used as the sole basis for treatment or other patient management decisions.  A negative result may occur with improper specimen collection / handling, submission of specimen other than nasopharyngeal swab, presence of viral mutation(s) within the  areas targeted by this assay, and inadequate number of viral copies (<250 copies / mL). A negative result must be combined with clinical observations, patient history, and epidemiological information.  Fact Sheet for Patients:   StrictlyIdeas.no  Fact Sheet for Healthcare Providers: BankingDealers.co.za  This test is not yet approved or  cleared by the [place] FDA and has been authorized for detection and/or diagnosis of SARS-CoV-2 by FDA under an Emergency Use Authorization (EUA).  This EUA will remain in effect (meaning this test can be used) for the duration of the COVID-19 declaration under Section 564(b)(1) of the Act, 21 U.S.C. section 360bbb-3(b)(1), unless the authorization is terminated or revoked sooner.  Performed at [facility], [place] [address], [place], [place] [address]    Time spent: 30 min  SIGNED:   Marylu Lund, MD  [facility] 02/12/2020, 3:01 PM  If 7PM-7AM, please contact night-coverage

## 2020-02-12 NOTE — Progress Notes (Signed)
Inpatient Diabetes Program Recommendations  AACE/ADA: New Consensus Statement on Inpatient Glycemic Control (2015)  Target Ranges:  Prepandial:   less than 140 mg/dL      Peak postprandial:   less than 180 mg/dL (1-2 hours)      Critically ill patients:  140 - 180 mg/dL   Lab Results  Component Value Date   GLUCAP 236 (H) 02/12/2020   HGBA1C >15.5 (H) 02/11/2020    Review of Glycemic Control  Diabetes history: New-onset DM Outpatient Diabetes medications: None Current orders for Inpatient glycemic control: Lantus 40 units QD, Novolog 0-15 units tidwc and hs + 12 units tidwc + 10 units tidwc  HgbA1C - > 15.5% 289, 236  Inpatient Diabetes Program Recommendations:     For discharge: Lantus 45 units QD Novolog 15 units tidwc Blood glucose meter  Spoke with patient about new diabetes diagnosis.  Discussed A1C results (> 15.5%) and explained what an A1C is and informed patient that his current A1C indicates an average glucose of approx 400+ mg/dl over the past 2-3 months. Discussed basic pathophysiology of DM Type 2, basic home care, importance of checking CBGs and maintaining good CBG control to prevent long-term and short-term complications. Reviewed glucose and A1C goals and explained that patient will need to continue to check blood sugars at least 3-4x/day at home.  Reviewed signs and symptoms of hyperglycemia and hypoglycemia along with treatment for both. Discussed impact of nutrition, exercise, stress, sickness, and medications on diabetes control. Reviewed Living Well with diabetes booklet and encouraged patient to read through entire book.   Educated patient on insulin pen use at home. Reviewed contents of insulin flexpen starter kit. Reviewed all steps if insulin pen including attachment of needle, 2-unit air shot, dialing up dose, giving injection, removing needle, disposal of sharps, storage of unused insulin, disposal of insulin etc. Patient able to provide successful return  demonstration. Also reviewed troubleshooting with insulin pen. MD to give patient Rxs for insulin pens and insulin pen needles.  Spoke with RN and MD.  Thank you. Lorenda Peck, RD, LDN, CDE Inpatient Diabetes Coordinator [phone]

## 2020-02-12 NOTE — TOC Initial Note (Addendum)
Transition of Care [facility]) - Initial/Assessment Note    Patient Details  Name: KACYN SOUDER MRN: [id number] Date of Birth: 1975-08-10  [facility] CM/SW Contact:    Durenda Guthrie, RN Phone Number: [phone] (working remotely) 02/12/2020, 2:59 PM  Clinical Narrative: 44 yr old male admitted with hyperglycemia, glucose greater than 900 on admission.  Case manager contacted for medication assistance. MATCH information entered, prescriptions will go to [facility] pharmacy. No further needs Identified.   Expected Discharge Plan: Home/Self Care Barriers to Discharge: Continued Medical Work up   Patient Goals and CMS Choice        Expected Discharge Plan and Services Expected Discharge Plan: Home/Self Care   Discharge Planning Services: Medication Assistance, MATCH Program                     DME Arranged: N/A DME Agency: NA       HH Arranged: NA HH Agency: NA        Prior Living Arrangements/Services                       Activities of Daily Living Home Assistive Devices/Equipment: None ADL Screening (condition at time of admission) Patient's cognitive ability adequate to safely complete daily activities?: Yes Is the patient deaf or have difficulty hearing?: No Does the patient have difficulty seeing, even when wearing glasses/contacts?: No Does the patient have difficulty concentrating, remembering, or making decisions?: No Patient able to express need for assistance with ADLs?: Yes Does the patient have difficulty dressing or bathing?: No Independently performs ADLs?: Yes (appropriate for developmental age) Does the patient have difficulty walking or climbing stairs?: No Weakness of Legs: None Weakness of Arms/Hands: None  Permission Sought/Granted                  Emotional Assessment              Admission diagnosis:  Hyperglycemic hyperosmolar nonketotic coma (HCC) [E11.01] Constipation, unspecified constipation type  [K59.00] New onset type 2 diabetes mellitus (HCC) [E11.9] Hyperosmolar hyperglycemic state (HHS) (HCC) [E11.00, E11.65] Patient Active Problem List   Diagnosis Date Noted  . Type 2 diabetes mellitus with hyperosmolar nonketotic hyperglycemia (HCC) 02/10/2020  . Hyperglycemic hyperosmolar nonketotic coma (HCC) 02/10/2020  . Gender identity disorder 02/10/2020  . Non-suicidal depressed mood 07/01/2018  . Migraine 08/02/2013  . 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 Human immunodeficiency virus (HIV) disease 10/27/2011  . HTN (hypertension) 10/27/2011  . Hypertriglyceridemia 10/27/2011  . Herpes 10/27/2011   PCP:  Judyann Munson, MD Pharmacy:   [facility] [phone] [address], [place] - [address] AT [facility] OF [address] [place] [address] Phone: [phone] Fax: [phone]  [facility] - [place], [place] - [address] [place] [address] [place] [address] Phone: [phone] Fax: [phone]     Social Determinants of Health (SDOH) Interventions    Readmission Risk Interventions No flowsheet data found.

## 2020-03-06 ENCOUNTER — Inpatient Hospital Stay: Payer: Medicaid Other | Admitting: Physician Assistant

## 2020-03-14 ENCOUNTER — Other Ambulatory Visit: Payer: Self-pay | Admitting: Internal Medicine

## 2020-03-14 DIAGNOSIS — I10 Essential (primary) hypertension: Secondary | ICD-10-CM

## 2020-03-14 DIAGNOSIS — R4589 Other symptoms and signs involving emotional state: Secondary | ICD-10-CM

## 2020-03-14 DIAGNOSIS — B029 Zoster without complications: Secondary | ICD-10-CM

## 2020-03-19 ENCOUNTER — Ambulatory Visit: Payer: Medicaid Other | Admitting: Internal Medicine

## 2020-05-22 ENCOUNTER — Encounter: Payer: Self-pay | Admitting: Internal Medicine

## 2020-05-22 ENCOUNTER — Other Ambulatory Visit: Payer: Self-pay

## 2020-05-22 ENCOUNTER — Ambulatory Visit (INDEPENDENT_AMBULATORY_CARE_PROVIDER_SITE_OTHER): Payer: Self-pay | Admitting: Internal Medicine

## 2020-05-22 VITALS — BP 166/121 | HR 114 | Wt 311.0 lb

## 2020-05-22 DIAGNOSIS — IMO0001 Reserved for inherently not codable concepts without codable children: Secondary | ICD-10-CM

## 2020-05-22 DIAGNOSIS — Z23 Encounter for immunization: Secondary | ICD-10-CM

## 2020-05-22 DIAGNOSIS — I129 Hypertensive chronic kidney disease with stage 1 through stage 4 chronic kidney disease, or unspecified chronic kidney disease: Secondary | ICD-10-CM

## 2020-05-22 DIAGNOSIS — B20 Human immunodeficiency virus [HIV] disease: Secondary | ICD-10-CM

## 2020-05-22 DIAGNOSIS — E1122 Type 2 diabetes mellitus with diabetic chronic kidney disease: Secondary | ICD-10-CM

## 2020-05-22 DIAGNOSIS — Z6841 Body Mass Index (BMI) 40.0 and over, adult: Secondary | ICD-10-CM

## 2020-05-22 DIAGNOSIS — I10 Essential (primary) hypertension: Secondary | ICD-10-CM

## 2020-05-22 NOTE — Progress Notes (Signed)
RFV: follow up for hiv disease  [name], adult   DOB: [birth year], 44 y.o.   MRN: [id number]  HPI Jeremiah Stevens is a 44yo TF with well controlled hiv disease, obesity and since we last saw her, she was diagnoted with poorly controlled DM, she was Started on insulin injections for diabetes. Has loss some weight. Hospitalized at end in July found to uti, constipation, and newly diagnosed diabetes (hgbA1c 15) Eating right and changing.  Her brother has moved to [place], and they maybe invited to go on a tv show? Reality show?   Outpatient Encounter Medications as of 05/22/2020  Medication Sig  . bictegravir-emtricitabine-tenofovir AF (BIKTARVY) 50-200-25 MG TABS tablet Take 1 tablet by mouth daily.  . hydrochlorothiazide (HYDRODIURIL) 25 MG tablet TAKE 1 TABLET BY MOUTH DAILY (Patient taking differently: Take 25 mg by mouth daily. )  . amLODipine (NORVASC) 10 MG tablet Take 1 tablet (10 mg total) by mouth daily.  . blood glucose meter kit and supplies KIT Dispense based on patient and insurance preference. Use up to four times daily as directed. (FOR ICD-9 250.00, 250.01).  . insulin aspart (NOVOLOG) 100 UNIT/ML FlexPen Inject 15 Units into the skin 3 (three) times daily with meals.  . insulin glargine (LANTUS) 100 UNIT/ML Solostar Pen Inject 45 Units into the skin daily.  . Insulin Pen Needle 32G X 4 MM MISC 1 Device by Does not apply route QID. For use with insulin pens  . metoprolol tartrate (LOPRESSOR) 25 MG tablet Take 1 tablet (25 mg total) by mouth 2 (two) times daily.  Marland Kitchen PARoxetine (PAXIL) 10 MG tablet Take 1 tablet (10 mg total) by mouth daily.  . polyethylene glycol (MIRALAX / GLYCOLAX) 17 g packet Take 17 g by mouth daily as needed for moderate constipation.  . senna (SENOKOT) 8.6 MG TABS tablet Take 1 tablet by mouth daily as needed for mild constipation.  . valACYclovir (VALTREX) 500 MG tablet TAKE 1 TABLET(500 MG) BY MOUTH TWICE DAILY   No facility-administered  encounter medications on file as of 05/22/2020.     Patient Active Problem List   Diagnosis Date Noted  . Type 2 diabetes mellitus with hyperosmolar nonketotic hyperglycemia ([place]) 02/10/2020  . Hyperglycemic hyperosmolar nonketotic coma ([place]) 02/10/2020  . Gender identity disorder 02/10/2020  . Non-suicidal depressed mood 07/01/2018  . Migraine 08/02/2013  . HIV-1 associated autonomic neuropathy ([place]) 08/02/2013  . Human immunodeficiency virus (HIV) disease 10/27/2011  . HTN (hypertension) 10/27/2011  . Hypertriglyceridemia 10/27/2011  . Herpes 10/27/2011     Health Maintenance Due  Topic Date Due  . FOOT EXAM  Never done  . OPHTHALMOLOGY EXAM  Never done  . URINE MICROALBUMIN  Never done  . COVID-19 Vaccine (1) Never done  . INFLUENZA VACCINE  02/11/2020     Review of Systems  Constitutional: Negative for fever, chills, diaphoresis, activity change, appetite change, fatigue and unexpected weight change.  HENT: Negative for congestion, sore throat, rhinorrhea, sneezing, trouble swallowing and sinus pressure.  Eyes: Negative for photophobia and visual disturbance.  Respiratory: Negative for cough, chest tightness, shortness of breath, wheezing and stridor.  Cardiovascular: Negative for chest pain, palpitations and leg swelling.  Gastrointestinal: Negative for nausea, vomiting, abdominal pain, diarrhea, constipation, blood in stool, abdominal distention and anal bleeding.  Genitourinary: Negative for dysuria, hematuria, flank pain and difficulty urinating.  Musculoskeletal: Negative for myalgias, back pain, joint swelling, arthralgias and gait problem.  Skin: Negative for color change, pallor, rash and wound.  Neurological: Negative  for dizziness, tremors, weakness and light-headedness.  Hematological: Negative for adenopathy. Does not bruise/bleed easily.  Psychiatric/Behavioral: Negative for behavioral problems, confusion, sleep disturbance, dysphoric mood, decreased  concentration and agitation.    Physical Exam   BP (!) 166/121   Pulse (!) 114   Wt (!) 311 lb (141.1 kg)   BMI 42.18 kg/m    Constitutional:  oriented to person, place, and time. appears well-developed and well-nourished. No distress.  HENT: Jeremiah Stevens/AT, PERRLA, no scleral icterus Mouth/Throat: Oropharynx is clear and moist. No oropharyngeal exudate.  Cardiovascular: Normal rate, regular rhythm and normal heart sounds. Exam reveals no gallop and no friction rub.  No murmur heard.  Pulmonary/Chest: Effort normal and breath sounds normal. No respiratory distress.  has no wheezes.  Neck = supple, no nuchal rigidity Abdominal: Soft. Bowel sounds are normal.  exhibits no distension. There is no tenderness.  Lymphadenopathy: no cervical adenopathy. No axillary adenopathy Neurological: alert and oriented to person, place, and time.  Skin: Skin is warm and dry. No rash noted. No erythema.  Psychiatric: a normal mood and affect.  behavior is normal.   Lab Results  Component Value Date   CD4TCELL 32 (L) 01/08/2020   Lab Results  Component Value Date   CD4TABS 576 01/08/2020   CD4TABS 437 06/21/2019   CD4TABS 550 01/24/2019   Lab Results  Component Value Date   HIV1RNAQUANT <20 DETECTED (A) 01/08/2020   Lab Results  Component Value Date   HEPBSAB NEG 10/08/2011   Lab Results  Component Value Date   LABRPR NON-REACTIVE 06/21/2019    CBC Lab Results  Component Value Date   WBC 4.6 02/10/2020   RBC 5.20 02/10/2020   HGB 15.5 02/10/2020   HCT 45.7 02/10/2020   PLT 202 02/10/2020   MCV 87.9 02/10/2020   MCH 29.8 02/10/2020   MCHC 33.9 02/10/2020   RDW 14.6 02/10/2020   LYMPHSABS 1,855 01/08/2020   MONOABS 616 09/22/2016   EOSABS 101 01/08/2020    BMET Lab Results  Component Value Date   NA 134 (L) 02/12/2020   K 4.0 02/12/2020   CL 103 02/12/2020   CO2 22 02/12/2020   GLUCOSE 289 (H) 02/12/2020   BUN 9 02/12/2020   CREATININE 0.99 02/12/2020   CALCIUM 8.9  02/12/2020   GFRNONAA >60 02/12/2020   GFRAA >60 02/12/2020      Assessment and Plan  hiv disease = will check labs, anticipate to continue on biktarvy  htn = poorly controlled, has not taken her morning bp meds as of yet. Will continue to follow  IDDM = will check hemoglobin 1c  Obesity = getting better with diet modification  Health miantenance = flu vaccine today; covid vaccine hesistant.

## 2020-05-23 LAB — T-HELPER CELL (CD4) - (RCID CLINIC ONLY)
CD4 % Helper T Cell: 31 % — ABNORMAL LOW (ref 33–65)
CD4 T Cell Abs: 609 /uL (ref 400–1790)

## 2020-05-25 LAB — CBC WITH DIFFERENTIAL/PLATELET
Absolute Monocytes: 561 cells/uL (ref 200–950)
Basophils Absolute: 28 cells/uL (ref 0–200)
Basophils Relative: 0.5 %
Eosinophils Absolute: 50 cells/uL (ref 15–500)
Eosinophils Relative: 0.9 %
HCT: 44.7 % (ref 38.5–50.0)
Hemoglobin: 15.2 g/dL (ref 13.2–17.1)
Lymphs Abs: 2112 cells/uL (ref 850–3900)
MCH: 29.8 pg (ref 27.0–33.0)
MCHC: 34 g/dL (ref 32.0–36.0)
MCV: 87.6 fL (ref 80.0–100.0)
MPV: 10.1 fL (ref 7.5–12.5)
Monocytes Relative: 10.2 %
Neutro Abs: 2750 cells/uL (ref 1500–7800)
Neutrophils Relative %: 50 %
Platelets: 274 10*3/uL (ref 140–400)
RBC: 5.1 10*6/uL (ref 4.20–5.80)
RDW: 13.8 % (ref 11.0–15.0)
Total Lymphocyte: 38.4 %
WBC: 5.5 10*3/uL (ref 3.8–10.8)

## 2020-05-25 LAB — HEMOGLOBIN A1C
Hgb A1c MFr Bld: 8.7 % of total Hgb — ABNORMAL HIGH (ref <5.7)
Mean Plasma Glucose: 203 (calc)
eAG (mmol/L): 11.2 (calc)

## 2020-05-25 LAB — COMPLETE METABOLIC PANEL WITH GFR
AG Ratio: 1.1 (calc) (ref 1.0–2.5)
ALT: 26 U/L (ref 9–46)
AST: 14 U/L (ref 10–40)
Albumin: 4.1 g/dL (ref 3.6–5.1)
Alkaline phosphatase (APISO): 92 U/L (ref 36–130)
BUN: 12 mg/dL (ref 7–25)
CO2: 27 mmol/L (ref 20–32)
Calcium: 9.1 mg/dL (ref 8.6–10.3)
Chloride: 105 mmol/L (ref 98–110)
Creat: 1.03 mg/dL (ref 0.60–1.35)
GFR, Est African American: 103 mL/min/{1.73_m2} (ref >=60)
GFR, Est Non African American: 89 mL/min/{1.73_m2} (ref >=60)
Globulin: 3.9 g/dL (calc) — ABNORMAL HIGH (ref 1.9–3.7)
Glucose, Bld: 92 mg/dL (ref 65–99)
Potassium: 4 mmol/L (ref 3.5–5.3)
Sodium: 139 mmol/L (ref 135–146)
Total Bilirubin: 0.4 mg/dL (ref 0.2–1.2)
Total Protein: 8 g/dL (ref 6.1–8.1)

## 2020-05-25 LAB — HIV-1 RNA QUANT-NO REFLEX-BLD
HIV 1 RNA Quant: <20 Copies/mL
HIV-1 RNA Quant, Log: <1.3 Log cps/mL

## 2020-05-25 LAB — RPR: RPR Ser Ql: NONREACTIVE

## 2020-06-14 ENCOUNTER — Ambulatory Visit: Payer: Self-pay

## 2020-07-03 ENCOUNTER — Other Ambulatory Visit: Payer: Self-pay | Admitting: Internal Medicine

## 2020-07-03 DIAGNOSIS — B20 Human immunodeficiency virus [HIV] disease: Secondary | ICD-10-CM

## 2020-08-05 ENCOUNTER — Telehealth: Payer: Self-pay

## 2020-08-05 NOTE — Telephone Encounter (Signed)
Received call from patient needing documents to submit for her case to show that she can work. Patient unsure exactly what document she needs, but her previous document had lab information on it. RN offered to get patient set up with MyChart to see if she can access what she needs on there. RN confirmed e-mail address with patient and sent her the sign up link.   If patient cannot find the information she needs on MyChart, she will bring her old document into the office to show [place] what she needs.   Sandie Ano, RN

## 2020-08-06 ENCOUNTER — Telehealth: Payer: Self-pay

## 2020-08-06 NOTE — Telephone Encounter (Signed)
Patient called requesting a re-print of his previous letter stating her HIV care start of service date and VL and CD4 status. Letter printed and will be available for pick up at the front desk.  Attempted to call patient, but no answer.  Valarie Cones

## 2020-08-07 NOTE — Telephone Encounter (Signed)
Patient is unable to make it to the office to pick up her letter today. She states it needs to be sent to the [facility] by 5pm today. RN advised her to get a fax number for the judge and we can fax the letter over. Patient will call back when she has fax number.   Sandie Ano, RN

## 2020-08-08 ENCOUNTER — Other Ambulatory Visit: Payer: Self-pay | Admitting: Internal Medicine

## 2020-08-08 DIAGNOSIS — B029 Zoster without complications: Secondary | ICD-10-CM

## 2020-09-09 ENCOUNTER — Ambulatory Visit: Payer: Self-pay | Admitting: Internal Medicine

## 2020-09-09 ENCOUNTER — Telehealth: Payer: Self-pay

## 2020-09-09 NOTE — Telephone Encounter (Signed)
I reached out to patient this morning due to his missed appointment to see if patient would be ok to do a phone visit with Dr. Drue Second.  Patient did not answer and call was unable to be completed. Diminique T Pricilla Loveless

## 2020-10-02 ENCOUNTER — Emergency Department (HOSPITAL_COMMUNITY): Payer: Self-pay

## 2020-10-02 ENCOUNTER — Inpatient Hospital Stay (HOSPITAL_COMMUNITY)
Admission: EM | Admit: 2020-10-02 | Discharge: 2020-10-04 | DRG: 637 | Disposition: A | Discharge status: Home / Self Care | Payer: Self-pay | Attending: Family Medicine | Admitting: Family Medicine

## 2020-10-02 ENCOUNTER — Other Ambulatory Visit: Payer: Self-pay

## 2020-10-02 ENCOUNTER — Encounter (HOSPITAL_COMMUNITY): Payer: Self-pay

## 2020-10-02 DIAGNOSIS — E669 Obesity, unspecified: Secondary | ICD-10-CM | POA: Diagnosis present

## 2020-10-02 DIAGNOSIS — F17210 Nicotine dependence, cigarettes, uncomplicated: Secondary | ICD-10-CM | POA: Diagnosis present

## 2020-10-02 DIAGNOSIS — Z8249 Family history of ischemic heart disease and other diseases of the circulatory system: Secondary | ICD-10-CM

## 2020-10-02 DIAGNOSIS — S99921A Unspecified injury of right foot, initial encounter: Secondary | ICD-10-CM | POA: Diagnosis present

## 2020-10-02 DIAGNOSIS — L03119 Cellulitis of unspecified part of limb: Secondary | ICD-10-CM

## 2020-10-02 DIAGNOSIS — R4589 Other symptoms and signs involving emotional state: Secondary | ICD-10-CM

## 2020-10-02 DIAGNOSIS — T383X6A Underdosing of insulin and oral hypoglycemic [antidiabetic] drugs, initial encounter: Secondary | ICD-10-CM | POA: Diagnosis present

## 2020-10-02 DIAGNOSIS — E1143 Type 2 diabetes mellitus with diabetic autonomic (poly)neuropathy: Secondary | ICD-10-CM | POA: Diagnosis present

## 2020-10-02 DIAGNOSIS — I10 Essential (primary) hypertension: Secondary | ICD-10-CM | POA: Diagnosis present

## 2020-10-02 DIAGNOSIS — L03115 Cellulitis of right lower limb: Secondary | ICD-10-CM | POA: Diagnosis present

## 2020-10-02 DIAGNOSIS — G909 Disorder of the autonomic nervous system, unspecified: Secondary | ICD-10-CM

## 2020-10-02 DIAGNOSIS — W228XXA Striking against or struck by other objects, initial encounter: Secondary | ICD-10-CM | POA: Diagnosis present

## 2020-10-02 DIAGNOSIS — U071 COVID-19: Secondary | ICD-10-CM | POA: Diagnosis present

## 2020-10-02 DIAGNOSIS — Z794 Long term (current) use of insulin: Secondary | ICD-10-CM

## 2020-10-02 DIAGNOSIS — Z91010 Allergy to peanuts: Secondary | ICD-10-CM

## 2020-10-02 DIAGNOSIS — Z79899 Other long term (current) drug therapy: Secondary | ICD-10-CM

## 2020-10-02 DIAGNOSIS — Z6835 Body mass index (BMI) 35.0-35.9, adult: Secondary | ICD-10-CM

## 2020-10-02 DIAGNOSIS — L089 Local infection of the skin and subcutaneous tissue, unspecified: Principal | ICD-10-CM

## 2020-10-02 DIAGNOSIS — R6510 Systemic inflammatory response syndrome (SIRS) of non-infectious origin without acute organ dysfunction: Secondary | ICD-10-CM | POA: Diagnosis present

## 2020-10-02 DIAGNOSIS — R0682 Tachypnea, not elsewhere classified: Secondary | ICD-10-CM | POA: Diagnosis present

## 2020-10-02 DIAGNOSIS — R911 Solitary pulmonary nodule: Secondary | ICD-10-CM | POA: Diagnosis present

## 2020-10-02 DIAGNOSIS — G43909 Migraine, unspecified, not intractable, without status migrainosus: Secondary | ICD-10-CM | POA: Diagnosis present

## 2020-10-02 DIAGNOSIS — E11621 Type 2 diabetes mellitus with foot ulcer: Secondary | ICD-10-CM | POA: Diagnosis present

## 2020-10-02 DIAGNOSIS — E11628 Type 2 diabetes mellitus with other skin complications: Secondary | ICD-10-CM

## 2020-10-02 DIAGNOSIS — E785 Hyperlipidemia, unspecified: Secondary | ICD-10-CM | POA: Diagnosis present

## 2020-10-02 DIAGNOSIS — R000 Tachycardia, unspecified: Secondary | ICD-10-CM | POA: Diagnosis present

## 2020-10-02 DIAGNOSIS — M7989 Other specified soft tissue disorders: Secondary | ICD-10-CM | POA: Diagnosis present

## 2020-10-02 DIAGNOSIS — B20 Human immunodeficiency virus [HIV] disease: Secondary | ICD-10-CM | POA: Diagnosis present

## 2020-10-02 DIAGNOSIS — E1110 Type 2 diabetes mellitus with ketoacidosis without coma: Principal | ICD-10-CM | POA: Diagnosis present

## 2020-10-02 LAB — CBC WITH DIFFERENTIAL/PLATELET
Abs Immature Granulocytes: 0.02 10*3/uL (ref 0.00–0.07)
Basophils Absolute: 0 10*3/uL (ref 0.0–0.1)
Basophils Relative: 0 %
Eosinophils Absolute: 0 10*3/uL (ref 0.0–0.5)
Eosinophils Relative: 0 %
HCT: 41.6 % (ref 39.0–52.0)
Hemoglobin: 14.7 g/dL (ref 13.0–17.0)
Immature Granulocytes: 0 %
Lymphocytes Relative: 34 %
Lymphs Abs: 1.7 10*3/uL (ref 0.7–4.0)
MCH: 30.9 pg (ref 26.0–34.0)
MCHC: 35.3 g/dL (ref 30.0–36.0)
MCV: 87.6 fL (ref 80.0–100.0)
Monocytes Absolute: 0.7 10*3/uL (ref 0.1–1.0)
Monocytes Relative: 14 %
Neutro Abs: 2.6 10*3/uL (ref 1.7–7.7)
Neutrophils Relative %: 52 %
Platelets: 212 10*3/uL (ref 150–400)
RBC: 4.75 MIL/uL (ref 4.22–5.81)
RDW: 16.7 % — ABNORMAL HIGH (ref 11.5–15.5)
WBC: 5.1 10*3/uL (ref 4.0–10.5)
nRBC: 0 % (ref 0.0–0.2)

## 2020-10-02 LAB — BASIC METABOLIC PANEL
Anion gap: 17 — ABNORMAL HIGH (ref 5–15)
Anion gap: 14 (ref 5–15)
Anion gap: 12 (ref 5–15)
Anion gap: 12 (ref 5–15)
BUN: 12 mg/dL (ref 6–20)
BUN: 10 mg/dL (ref 6–20)
BUN: 9 mg/dL (ref 6–20)
BUN: 8 mg/dL (ref 6–20)
CO2: 16 mmol/L — ABNORMAL LOW (ref 22–32)
CO2: 19 mmol/L — ABNORMAL LOW (ref 22–32)
CO2: 18 mmol/L — ABNORMAL LOW (ref 22–32)
CO2: 17 mmol/L — ABNORMAL LOW (ref 22–32)
Calcium: 9.2 mg/dL (ref 8.9–10.3)
Calcium: 9.2 mg/dL (ref 8.9–10.3)
Calcium: 8.5 mg/dL — ABNORMAL LOW (ref 8.9–10.3)
Calcium: 8.2 mg/dL — ABNORMAL LOW (ref 8.9–10.3)
Chloride: 98 mmol/L (ref 98–111)
Chloride: 104 mmol/L (ref 98–111)
Chloride: 105 mmol/L (ref 98–111)
Chloride: 102 mmol/L (ref 98–111)
Creatinine, Ser: 1.07 mg/dL (ref 0.61–1.24)
Creatinine, Ser: 1 mg/dL (ref 0.61–1.24)
Creatinine, Ser: 0.73 mg/dL (ref 0.61–1.24)
Creatinine, Ser: 0.68 mg/dL (ref 0.61–1.24)
GFR, Estimated: >60 mL/min (ref >60)
GFR, Estimated: >60 mL/min (ref >60)
GFR, Estimated: >60 mL/min (ref >60)
GFR, Estimated: >60 mL/min (ref >60)
Glucose, Bld: 477 mg/dL — ABNORMAL HIGH (ref 70–99)
Glucose, Bld: 179 mg/dL — ABNORMAL HIGH (ref 70–99)
Glucose, Bld: 169 mg/dL — ABNORMAL HIGH (ref 70–99)
Glucose, Bld: 307 mg/dL — ABNORMAL HIGH (ref 70–99)
Potassium: 4.8 mmol/L (ref 3.5–5.1)
Potassium: 3.6 mmol/L (ref 3.5–5.1)
Potassium: 3.3 mmol/L — ABNORMAL LOW (ref 3.5–5.1)
Potassium: 3.7 mmol/L (ref 3.5–5.1)
Sodium: 131 mmol/L — ABNORMAL LOW (ref 135–145)
Sodium: 137 mmol/L (ref 135–145)
Sodium: 135 mmol/L (ref 135–145)
Sodium: 131 mmol/L — ABNORMAL LOW (ref 135–145)

## 2020-10-02 LAB — GLUCOSE, CAPILLARY
Glucose-Capillary: 238 mg/dL — ABNORMAL HIGH (ref 70–99)
Glucose-Capillary: 177 mg/dL — ABNORMAL HIGH (ref 70–99)
Glucose-Capillary: 183 mg/dL — ABNORMAL HIGH (ref 70–99)
Glucose-Capillary: 169 mg/dL — ABNORMAL HIGH (ref 70–99)
Glucose-Capillary: 185 mg/dL — ABNORMAL HIGH (ref 70–99)
Glucose-Capillary: 131 mg/dL — ABNORMAL HIGH (ref 70–99)
Glucose-Capillary: 229 mg/dL — ABNORMAL HIGH (ref 70–99)
Glucose-Capillary: 299 mg/dL — ABNORMAL HIGH (ref 70–99)

## 2020-10-02 LAB — URINALYSIS, ROUTINE W REFLEX MICROSCOPIC
Bacteria, UA: NONE SEEN
Bilirubin Urine: NEGATIVE
Glucose, UA: >=500 mg/dL — AB
Hgb urine dipstick: NEGATIVE
Ketones, ur: 80 mg/dL — AB
Leukocytes,Ua: NEGATIVE
Nitrite: NEGATIVE
Protein, ur: NEGATIVE mg/dL
Specific Gravity, Urine: 1.028 (ref 1.005–1.030)
pH: 5 (ref 5.0–8.0)

## 2020-10-02 LAB — CBG MONITORING, ED
Glucose-Capillary: 493 mg/dL — ABNORMAL HIGH (ref 70–99)
Glucose-Capillary: 350 mg/dL — ABNORMAL HIGH (ref 70–99)
Glucose-Capillary: 488 mg/dL — ABNORMAL HIGH (ref 70–99)

## 2020-10-02 LAB — BETA-HYDROXYBUTYRIC ACID
Beta-Hydroxybutyric Acid: 4.22 mmol/L — ABNORMAL HIGH (ref 0.05–0.27)
Beta-Hydroxybutyric Acid: 3.33 mmol/L — ABNORMAL HIGH (ref 0.05–0.27)
Beta-Hydroxybutyric Acid: 3.98 mmol/L — ABNORMAL HIGH (ref 0.05–0.27)

## 2020-10-02 LAB — MRSA PCR SCREENING: MRSA by PCR: NEGATIVE

## 2020-10-02 LAB — BLOOD GAS, VENOUS
Acid-base deficit: 8.8 mmol/L — ABNORMAL HIGH (ref 0.0–2.0)
Bicarbonate: 15.8 mmol/L — ABNORMAL LOW (ref 20.0–28.0)
FIO2: 21
O2 Saturation: 98.5 %
Patient temperature: 98.6
pCO2, Ven: 31.5 mmHg — ABNORMAL LOW (ref 44.0–60.0)
pH, Ven: 7.32 (ref 7.250–7.430)
pO2, Ven: 133 mmHg — ABNORMAL HIGH (ref 32.0–45.0)

## 2020-10-02 LAB — RESP PANEL BY RT-PCR (FLU A&B, COVID) ARPGX2
Influenza A by PCR: NEGATIVE
Influenza B by PCR: NEGATIVE
SARS Coronavirus 2 by RT PCR: POSITIVE — AB

## 2020-10-02 LAB — MAGNESIUM: Magnesium: 2.3 mg/dL (ref 1.7–2.4)

## 2020-10-02 LAB — D-DIMER, QUANTITATIVE: D-Dimer, Quant: 0.66 ug/mL-FEU — ABNORMAL HIGH (ref 0.00–0.50)

## 2020-10-02 LAB — C-REACTIVE PROTEIN: CRP: <0.5 mg/dL (ref <1.0)

## 2020-10-02 MED ORDER — INSULIN ASPART 100 UNIT/ML ~~LOC~~ SOLN
6.0000 [IU] | Freq: Once | SUBCUTANEOUS | Status: AC
  Administered 2020-10-02: 6 [IU] via SUBCUTANEOUS

## 2020-10-02 MED ORDER — DEXTROSE 50 % IV SOLN: 0.0000 mL | INTRAVENOUS | Status: DC | PRN

## 2020-10-02 MED ORDER — PIPERACILLIN-TAZOBACTAM 3.375 G IVPB 30 MIN
3.3750 g | Freq: Once | INTRAVENOUS | Status: DC
  Filled 2020-10-02: qty 50

## 2020-10-02 MED ORDER — NICOTINE 14 MG/24HR TD PT24
14.0000 mg | MEDICATED_PATCH | Freq: Every day | TRANSDERMAL | Status: DC
  Administered 2020-10-02 – 2020-10-04 (×3): 14 mg via TRANSDERMAL
  Filled 2020-10-02 (×4): qty 1

## 2020-10-02 MED ORDER — SODIUM CHLORIDE 0.9 % IV SOLN
2.0000 g | Freq: Three times a day (TID) | INTRAVENOUS | Status: DC
  Administered 2020-10-02 – 2020-10-03 (×3): 2 g via INTRAVENOUS
  Filled 2020-10-02 (×3): qty 2

## 2020-10-02 MED ORDER — DEXTROSE IN LACTATED RINGERS 5 % IV SOLN
INTRAVENOUS | Status: DC
Start: 2020-10-02 — End: 2020-10-02

## 2020-10-02 MED ORDER — IPRATROPIUM-ALBUTEROL 20-100 MCG/ACT IN AERS
1.0000 | INHALATION_SPRAY | Freq: Four times a day (QID) | RESPIRATORY_TRACT | Status: DC | PRN
  Filled 2020-10-02: qty 4

## 2020-10-02 MED ORDER — VANCOMYCIN HCL 1500 MG/300ML IV SOLN
1500.0000 mg | Freq: Two times a day (BID) | INTRAVENOUS | Status: DC
  Administered 2020-10-03 – 2020-10-04 (×4): 1500 mg via INTRAVENOUS
  Filled 2020-10-02 (×4): qty 300

## 2020-10-02 MED ORDER — ORAL CARE MOUTH RINSE
15.0000 mL | Freq: Two times a day (BID) | OROMUCOSAL | Status: DC
  Administered 2020-10-02 – 2020-10-04 (×4): 15 mL via OROMUCOSAL

## 2020-10-02 MED ORDER — INSULIN ASPART 100 UNIT/ML ~~LOC~~ SOLN
0.0000 [IU] | Freq: Three times a day (TID) | SUBCUTANEOUS | Status: DC
  Administered 2020-10-03: 4 [IU] via SUBCUTANEOUS
  Administered 2020-10-03: 15 [IU] via SUBCUTANEOUS
  Administered 2020-10-03: 4 [IU] via SUBCUTANEOUS
  Administered 2020-10-04: 20 [IU] via SUBCUTANEOUS
  Administered 2020-10-04: 7 [IU] via SUBCUTANEOUS

## 2020-10-02 MED ORDER — VANCOMYCIN HCL IN DEXTROSE 1-5 GM/200ML-% IV SOLN
1000.0000 mg | Freq: Once | INTRAVENOUS | Status: DC
Start: 2020-10-02 — End: 2020-10-02

## 2020-10-02 MED ORDER — POTASSIUM CHLORIDE 10 MEQ/100ML IV SOLN
10.0000 meq | INTRAVENOUS | Status: AC
  Administered 2020-10-02 (×2): 10 meq via INTRAVENOUS
  Filled 2020-10-02: qty 100

## 2020-10-02 MED ORDER — SODIUM CHLORIDE 0.9 % IV SOLN
INTRAVENOUS | Status: DC | PRN
  Administered 2020-10-02: 500 mL via INTRAVENOUS

## 2020-10-02 MED ORDER — VALACYCLOVIR HCL 500 MG PO TABS
500.0000 mg | ORAL_TABLET | Freq: Two times a day (BID) | ORAL | Status: DC
  Administered 2020-10-02 – 2020-10-04 (×5): 500 mg via ORAL
  Filled 2020-10-02 (×6): qty 1

## 2020-10-02 MED ORDER — SODIUM CHLORIDE 0.9 % IV BOLUS
1000.0000 mL | Freq: Once | INTRAVENOUS | Status: AC
  Administered 2020-10-02: 1000 mL via INTRAVENOUS

## 2020-10-02 MED ORDER — VANCOMYCIN HCL 10 G IV SOLR
2500.0000 mg | Freq: Once | INTRAVENOUS | Status: AC
  Administered 2020-10-02: 2500 mg via INTRAVENOUS
  Filled 2020-10-02: qty 2500

## 2020-10-02 MED ORDER — LACTATED RINGERS IV SOLN: INTRAVENOUS | Status: DC

## 2020-10-02 MED ORDER — INSULIN REGULAR(HUMAN) IN NACL 100-0.9 UT/100ML-% IV SOLN
INTRAVENOUS | Status: DC
  Administered 2020-10-02: 19 [IU]/h via INTRAVENOUS
  Filled 2020-10-02: qty 100

## 2020-10-02 MED ORDER — ACETAMINOPHEN 500 MG PO TABS
1000.0000 mg | ORAL_TABLET | Freq: Once | ORAL | Status: AC
  Administered 2020-10-02: 1000 mg via ORAL
  Filled 2020-10-02: qty 2

## 2020-10-02 MED ORDER — CHLORHEXIDINE GLUCONATE CLOTH 2 % EX PADS
6.0000 | MEDICATED_PAD | Freq: Every day | CUTANEOUS | Status: DC
  Administered 2020-10-02 – 2020-10-04 (×3): 6 via TOPICAL

## 2020-10-02 MED ORDER — ENOXAPARIN SODIUM 40 MG/0.4ML ~~LOC~~ SOLN
40.0000 mg | SUBCUTANEOUS | Status: DC
  Administered 2020-10-02 – 2020-10-03 (×2): 40 mg via SUBCUTANEOUS
  Filled 2020-10-02 (×2): qty 0.4

## 2020-10-02 MED ORDER — PAROXETINE HCL 10 MG PO TABS
10.0000 mg | ORAL_TABLET | Freq: Every day | ORAL | Status: DC
  Administered 2020-10-02 – 2020-10-04 (×3): 10 mg via ORAL
  Filled 2020-10-02 (×3): qty 1

## 2020-10-02 MED ORDER — INSULIN ASPART 100 UNIT/ML ~~LOC~~ SOLN
4.0000 [IU] | Freq: Once | SUBCUTANEOUS | Status: AC
  Administered 2020-10-02: 4 [IU] via SUBCUTANEOUS

## 2020-10-02 MED ORDER — BICTEGRAVIR-EMTRICITAB-TENOFOV 50-200-25 MG PO TABS
1.0000 | ORAL_TABLET | Freq: Every day | ORAL | Status: DC
  Administered 2020-10-02 – 2020-10-04 (×3): 1 via ORAL
  Filled 2020-10-02 (×3): qty 1

## 2020-10-02 MED ORDER — SODIUM CHLORIDE 0.9 % IV SOLN
100.0000 mg | Freq: Every day | INTRAVENOUS | Status: AC
  Administered 2020-10-03 – 2020-10-04 (×2): 100 mg via INTRAVENOUS
  Filled 2020-10-02 (×2): qty 20

## 2020-10-02 MED ORDER — SODIUM CHLORIDE 0.9 % IV SOLN
200.0000 mg | Freq: Once | INTRAVENOUS | Status: AC
  Administered 2020-10-02: 200 mg via INTRAVENOUS
  Filled 2020-10-02: qty 40

## 2020-10-02 MED ORDER — INSULIN ASPART 100 UNIT/ML ~~LOC~~ SOLN
6.0000 [IU] | Freq: Three times a day (TID) | SUBCUTANEOUS | Status: DC
  Administered 2020-10-03 (×2): 6 [IU] via SUBCUTANEOUS

## 2020-10-02 MED ORDER — LABETALOL HCL 5 MG/ML IV SOLN
10.0000 mg | INTRAVENOUS | Status: DC | PRN
  Administered 2020-10-04: 10 mg via INTRAVENOUS
  Filled 2020-10-02: qty 4

## 2020-10-02 MED ORDER — INSULIN GLARGINE 100 UNIT/ML ~~LOC~~ SOLN
30.0000 [IU] | SUBCUTANEOUS | Status: DC
  Administered 2020-10-02: 30 [IU] via SUBCUTANEOUS
  Filled 2020-10-02 (×2): qty 0.3

## 2020-10-02 MED ORDER — AMLODIPINE BESYLATE 10 MG PO TABS
10.0000 mg | ORAL_TABLET | Freq: Every day | ORAL | Status: DC
Start: 2020-10-02 — End: 2020-10-04
  Administered 2020-10-02 – 2020-10-04 (×3): 10 mg via ORAL
  Filled 2020-10-02 (×3): qty 1
  Filled 2020-10-02: qty 2

## 2020-10-02 NOTE — H&P (Addendum)
History and Physical        Hospital Admission Note Date: 10/02/2020  Patient name: Jeremiah Stevens Medical record number: [id number] Date of birth: 1976-05-01 Age: 45 y.o. Gender: adult  PCP: Carlyle Basques, MD    Chief Complaint    Chief Complaint  Patient presents with  . Hyperglycemia  . Leg Swelling      HPI:   This is a 45 year old male (Male gender identity) with a past medical history of diabetes, HIV, hypertension, hyperlipidemia who presented to the ED with complaints of right foot pain and swelling x1 week.  Patient stubbed her right fifth toe on a cabinet roughly 1 week ago.  She stepped into a tub and felt like her skin cracked a bit.  Has since had progressively worsening discomfort and swelling with development of redness. Also noted some white drainage from the toe this morning.  Denies fever, chills, nausea vomiting or any other complaints.  Of note, has not been taking his insulin at home due to financial constraints and does not recall the last time she took insulin. Reports she adheres to a diabetic diet with the exception of this week when she has been eating very poorly.    ED Course: Afebrile, tachycardic, tachypneic,  hemodynamically stable, on room air. Notable Labs: Sodium 131, K4.8, CO2 16, glucose 477, BUN 12, creatinine 1.07, AG 17, WBC 5.1, Hb 14.7, COVID-19 pending. Notable Imaging: Right foot XR-soft tissue swelling without fracture. Patient received Zosyn, Tylenol, 1 L NS bolus and insulin drip.    Vitals:   10/02/20 1100 10/02/20 1141  BP: (!) 144/129 131/90  Pulse: (!) 105 (!) 112  Resp: 16 18  Temp:    SpO2: 96% 99%     Review of Systems:  Review of Systems  All other systems reviewed and are negative.   Medical/Social/Family History   Past Medical History: Past Medical History:  Diagnosis Date  . Diabetes mellitus without  complication ([place])   . Heartburn    occasional; OTC as needed  . Herpes genitalis in men   . HIV disease ([place])   . Hyperlipidemia   . Hypertension    under control with med., has been on med. x 1 yr.  . Lateral malleolar fracture 09/02/2013   left  . Migraines   . Tear of deltoid ligament of left ankle 09/02/2013    Past Surgical History:  Procedure Laterality Date  . NO PAST SURGERIES    . ORIF ANKLE FRACTURE Left 09/13/2013   Procedure: OPEN REDUCTION INTERNAL FIXATION (ORIF) LEFT LATERAL MALLEOLUS ANKLE FRACTURE ;  Surgeon: Vickey Huger, MD;  Location: [facility];  Service: Orthopedics;  Laterality: Left;    Medications: Prior to Admission medications   Medication Sig Start Date End Date Taking? Authorizing Provider  amLODipine (NORVASC) 10 MG tablet Take 1 tablet (10 mg total) by mouth daily. 03/14/20  Yes Carlyle Basques, MD  bictegravir-emtricitabine-tenofovir AF (BIKTARVY) 50-200-25 MG TABS tablet Take 1 tablet by mouth daily. 01/08/20  Yes Carlyle Basques, MD  hydrochlorothiazide (HYDRODIURIL) 25 MG tablet TAKE 1 TABLET BY MOUTH DAILY Patient taking differently: Take 25 mg by mouth daily. 01/08/20  Yes Carlyle Basques, MD  metoprolol tartrate (LOPRESSOR)  25 MG tablet Take 1 tablet (25 mg total) by mouth 2 (two) times daily. 02/12/20 03/13/20 Yes Donne Hazel, MD  PARoxetine (PAXIL) 10 MG tablet Take 1 tablet (10 mg total) by mouth daily. Patient taking differently: Take 10 mg by mouth daily as needed (anxiety). 01/08/20  Yes Carlyle Basques, MD  polyethylene glycol (MIRALAX / GLYCOLAX) 17 g packet Take 17 g by mouth daily as needed for moderate constipation.   Yes [provider]  valACYclovir (VALTREX) 500 MG tablet TAKE 1 TABLET(500 MG) BY MOUTH TWICE DAILY Patient taking differently: Take 500 mg by mouth 2 (two) times daily. 08/08/20  Yes Carlyle Basques, MD  blood glucose meter kit and supplies KIT Dispense based on patient and insurance preference. Use up to  four times daily as directed. (FOR ICD-9 250.00, 250.01). 02/12/20   Donne Hazel, MD  insulin aspart (NOVOLOG) 100 UNIT/ML FlexPen Inject 15 Units into the skin 3 (three) times daily with meals. Patient not taking: Reported on 10/02/2020 02/12/20   Donne Hazel, MD  insulin glargine (LANTUS) 100 UNIT/ML Solostar Pen Inject 45 Units into the skin daily. Patient not taking: Reported on 10/02/2020 02/12/20   Donne Hazel, MD  Insulin Pen Needle 32G X 4 MM MISC 1 Device by Does not apply route QID. For use with insulin pens 02/12/20   Donne Hazel, MD    Allergies:   Allergies  Allergen Reactions  . Peanut-Containing Drug Products Other (See Comments)    WALNUTS - SORES ON TONGUE  . Sustiva [Efavirenz] Rash    Social History:  reports that she has been smoking cigarettes. She has a 7.50 pack-year smoking history. She has never used smokeless tobacco. She reports current drug use. Frequency: 7.00 times per week. Drug: Marijuana. She reports that she does not drink alcohol.  Family History: Family History  Problem Relation Age of Onset  . Hypertension Mother      Objective   Physical Exam: Blood pressure 131/90, pulse (!) 112, temperature 99.1 F (37.3 C), temperature source Oral, resp. rate 18, SpO2 99 %.  Physical Exam Vitals and nursing note reviewed.  Constitutional:      General: She is not in acute distress.    Appearance: She is obese. She is not toxic-appearing.  HENT:     Head: Normocephalic and atraumatic.  Eyes:     Conjunctiva/sclera: Conjunctivae normal.  Cardiovascular:     Rate and Rhythm: Regular rhythm. Tachycardia present.  Pulmonary:     Effort: Pulmonary effort is normal.     Breath sounds: Normal breath sounds.  Abdominal:     General: Abdomen is flat.     Palpations: Abdomen is soft.  Musculoskeletal:       Legs:  Skin:    Coloration: Skin is not jaundiced or pale.  Neurological:     Mental Status: She is alert. Mental status is at baseline.   Psychiatric:        Mood and Affect: Mood normal.        Behavior: Behavior normal.     LABS on Admission: I have personally reviewed all the labs and imaging below    Basic Metabolic Panel: Recent Labs  Lab 10/02/20 1013  NA 131*  K 4.8  CL 98  CO2 16*  GLUCOSE 477*  BUN 12  CREATININE 1.07  CALCIUM 9.2   Liver Function Tests: No results for input(s): AST, ALT, ALKPHOS, BILITOT, PROT, ALBUMIN in the last 168 hours. No results for  input(s): LIPASE, AMYLASE in the last 168 hours. No results for input(s): AMMONIA in the last 168 hours. CBC: Recent Labs  Lab 10/02/20 1013  WBC 5.1  NEUTROABS 2.6  HGB 14.7  HCT 41.6  MCV 87.6  PLT 212   Cardiac Enzymes: No results for input(s): CKTOTAL, CKMB, CKMBINDEX, TROPONINI in the last 168 hours. BNP: Invalid input(s): POCBNP CBG: Recent Labs  Lab 10/02/20 0946 10/02/20 1139  GLUCAP 493* 488*    Radiological Exams on Admission:  DG Foot Complete Right  Result Date: 10/02/2020 CLINICAL DATA:  Right foot swelling and pain after trauma this week. EXAM: RIGHT FOOT COMPLETE - 3+ VIEW COMPARISON:  None. FINDINGS: Soft tissue swelling to the forefoot is seen on the lateral view. No acute fracture or subluxation. No opaque foreign body or soft tissue gas. Small Achilles enthesophyte. IMPRESSION: Soft tissue swelling without fracture. Electronically Signed   By: Monte Fantasia M.D.   On: 10/02/2020 11:10      EKG: nonspecific ST and T waves changes, sinus tachycardia   A & P   Principal Problem:   DKA (diabetic ketoacidosis) ([facility]) Active Problems:   HTN (hypertension)   HIV-1 associated autonomic neuropathy ([facility])   Cellulitis in diabetic foot ([place])   1. Mild-moderate DKA, likely multifactorial: Insulin noncompliance, poor diet and new infection a. Glucose 477, CO2 16, AG 17, ketones pending b. Continue IV fluids and insulin drip per DKA protocol c. HbA1c d. Dietitian consult for education e. N.p.o. for  now f. Diabetic coordinator consult to assist with getting insulin and supplies  2. SIRS likely secondary to purulent right foot cellulitis a. SIRS criteria: Tachycardic and tachypneic b. Patient is immunocompromised due to HIV and poorly controlled diabetes so will cover with broad spectrum IV antibiotics for now c. Change Zosyn to vancomycin and cefepime.  Can change to p.o. antibiotics prior to discharge  3. HIV a. Continue Biktarvy and valacyclovir  4. Hypertension a. Continue home amlodipine when tolerating p.o. b. Hold HCTZ c. Labetalol as needed  5. Incidental COVID 19 positive a. Start remdesivir for 3 days as she is immunocompromised and obese b. Incentive spirometer  c. Combivent PRN     DVT prophylaxis: Lovenox   Code Status: Full Code  Diet: N.p.o. Family Communication: Admission, patients condition and plan of care including tests being ordered have been discussed with the patient who indicates understanding and agrees with the plan and Code Status.  Disposition Plan: The appropriate patient status for this patient is OBSERVATION. Observation status is judged to be reasonable and necessary in order to provide the required intensity of service to ensure the patient's safety. The patient's presenting symptoms, physical exam findings, and initial radiographic and laboratory data in the context of their medical condition is felt to place them at decreased risk for further clinical deterioration. Furthermore, it is anticipated that the patient will be medically stable for discharge from the hospital within 2 midnights of admission. The following factors support the patient status of observation.   " The patient's presenting symptoms include right foot pain. " The physical exam findings include purulent drainage from right foot. " The initial radiographic and laboratory data are hyperglycemia and anion gap.       The medical decision making on this patient was of high  complexity and the patient is at high risk for clinical deterioration, therefore this is a level 3  admission.  Consultants  . None  Procedures  . None  Time Spent on Admission:  16mnutes    JHarold Hedge DO [facility]  10/02/2020, 12:24 PM

## 2020-10-02 NOTE — Progress Notes (Addendum)
Pharmacy Antibiotic Note  Jeremiah Stevens is a 45 y.o. adult with DM and HIV admitted on 10/02/2020 with cellulitis.  Pharmacy has been consulted for vancomycin and cefepime dosing.  Plan: Vanc 2500mg  IV x 1 then 1500mg  IV q12 - goal AUC 400-550 Will check vanc trough and peak as needed Cefepime 2g IV q8    Temp (24hrs), Avg:99.1 F (37.3 C), Min:99.1 F (37.3 C), Max:99.1 F (37.3 C)  Recent Labs  Lab 10/02/20 1013  WBC 5.1  CREATININE 1.07    CrCl cannot be calculated (Unknown ideal weight.).    Allergies  Allergen Reactions  . Peanut-Containing Drug Products Other (See Comments)    WALNUTS - SORES ON TONGUE  . Sustiva [Efavirenz] Rash     Thank you for allowing pharmacy to be a part of this patient's care.  10/02/2020 12:22 PM

## 2020-10-02 NOTE — Progress Notes (Signed)
Rt gave pt flutter valve per MD order. Pt knows and understands how to do.

## 2020-10-02 NOTE — ED Triage Notes (Signed)
Pt arrived via POV, c/o right foot/toe swelling after injuring it last week. Worsening swelling since. Hx of diabetes. CBG 493 in triage, states not been able to keep up with insulin as normal due to living situation at the moment.

## 2020-10-02 NOTE — ED Notes (Signed)
CBG check next at 1319

## 2020-10-02 NOTE — ED Notes (Signed)
Communication sent to [facility] for report

## 2020-10-02 NOTE — ED Provider Notes (Signed)
[facility] DEPT Provider Note   CSN: [id number] Arrival date & time: 10/02/20  0935     History Chief Complaint  Patient presents with  . Hyperglycemia  . Leg Swelling    Jeremiah Stevens is a 45 y.o. adult with a history of diabetes mellitus, HIV with last CD4 count of 609, hypertension, and hyperlipidemia who presents to the emergency department with complaints of right foot pain and swelling which has progressively worsened over the past 1 week.  Patient states that she stubbed her fifth toe on a cabinet in her home with onset of discomfort, she then stepped into the tub and felt like her skin cracked some.  She has since had progressively worsening discomfort and swelling with development of redness.  No alleviating or aggravating factors.  She denies fever, chills, nausea, vomiting, abdominal pain, chest pain, shortness of breath, or cough.  She has not been taking her insulin at home.   HPI     Past Medical History:  Diagnosis Date  . Diabetes mellitus without complication ([place])   . Heartburn    occasional; OTC as needed  . Herpes genitalis in men   . HIV disease ([place])   . Hyperlipidemia   . Hypertension    under control with med., has been on med. x 1 yr.  . Lateral malleolar fracture 09/02/2013   left  . Migraines   . Tear of deltoid ligament of left ankle 09/02/2013    Patient Active Problem List   Diagnosis Date Noted  . Type 2 diabetes mellitus with hyperosmolar nonketotic hyperglycemia ([place]) 02/10/2020  . Hyperglycemic hyperosmolar nonketotic coma ([place]) 02/10/2020  . Gender identity disorder 02/10/2020  . Non-suicidal depressed mood 07/01/2018  . Migraine 08/02/2013  . 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 Human immunodeficiency virus (HIV) disease 10/27/2011  . HTN (hypertension) 10/27/2011  . Hypertriglyceridemia 10/27/2011  . Herpes 10/27/2011    Past Surgical History:  Procedure Laterality Date  . NO  PAST SURGERIES    . ORIF ANKLE FRACTURE Left 09/13/2013   Procedure: OPEN REDUCTION INTERNAL FIXATION (ORIF) LEFT LATERAL MALLEOLUS ANKLE FRACTURE ;  Surgeon: Vickey Huger, MD;  Location: [facility];  Service: Orthopedics;  Laterality: Left;       Family History  Problem Relation Age of Onset  . Hypertension Mother     Social History   Tobacco Use  . Smoking status: Current Every Day Smoker    Packs/day: 0.50    Years: 15.00    Pack years: 7.50    Types: Cigarettes  . Smokeless tobacco: Never Used  Vaping Use  . Vaping Use: Former  Substance Use Topics  . Alcohol use: No  . Drug use: Yes    Frequency: 7.0 times per week    Types: Marijuana    Comment: Daily     Home Medications Prior to Admission medications   Medication Sig Start Date End Date Taking? Authorizing Provider  amLODipine (NORVASC) 10 MG tablet Take 1 tablet (10 mg total) by mouth daily. 03/14/20   Carlyle Basques, MD  bictegravir-emtricitabine-tenofovir AF (BIKTARVY) 50-200-25 MG TABS tablet Take 1 tablet by mouth daily. 01/08/20   Carlyle Basques, MD  blood glucose meter kit and supplies KIT Dispense based on patient and insurance preference. Use up to four times daily as directed. (FOR ICD-9 250.00, 250.01). 02/12/20   Donne Hazel, MD  hydrochlorothiazide (HYDRODIURIL) 25 MG tablet TAKE 1 TABLET BY MOUTH DAILY Patient taking differently: Take  25 mg by mouth daily.  01/08/20   Carlyle Basques, MD  insulin aspart (NOVOLOG) 100 UNIT/ML FlexPen Inject 15 Units into the skin 3 (three) times daily with meals. 02/12/20   Donne Hazel, MD  insulin glargine (LANTUS) 100 UNIT/ML Solostar Pen Inject 45 Units into the skin daily. 02/12/20   Donne Hazel, MD  Insulin Pen Needle 32G X 4 MM MISC 1 Device by Does not apply route QID. For use with insulin pens 02/12/20   Donne Hazel, MD  metoprolol tartrate (LOPRESSOR) 25 MG tablet Take 1 tablet (25 mg total) by mouth 2 (two) times daily. 02/12/20 03/13/20  Donne Hazel, MD  PARoxetine (PAXIL) 10 MG tablet Take 1 tablet (10 mg total) by mouth daily. 01/08/20   Carlyle Basques, MD  polyethylene glycol (MIRALAX / GLYCOLAX) 17 g packet Take 17 g by mouth daily as needed for moderate constipation.    [provider]  senna (SENOKOT) 8.6 MG TABS tablet Take 1 tablet by mouth daily as needed for mild constipation.    [provider]  valACYclovir (VALTREX) 500 MG tablet TAKE 1 TABLET(500 MG) BY MOUTH TWICE DAILY 08/08/20   Carlyle Basques, MD    Allergies    Peanut-containing drug products and Sustiva [efavirenz]  Review of Systems   Review of Systems  Constitutional: Negative for chills and fever.  Respiratory: Negative for cough and shortness of breath.   Cardiovascular: Negative for chest pain.  Gastrointestinal: Negative for abdominal pain, nausea and vomiting.  Musculoskeletal: Positive for arthralgias and joint swelling.  Skin: Positive for color change.  Neurological: Negative for syncope.  All other systems reviewed and are negative.   Physical Exam Updated Vital Signs BP (!) 130/98   Pulse (!) 108   Temp 99.1 F (37.3 C) (Oral)   Resp 19   SpO2 99%   Physical Exam Vitals and nursing note reviewed.  Constitutional:      General: She is not in acute distress.    Appearance: She is well-developed. She is not ill-appearing or toxic-appearing.  HENT:     Head: Normocephalic and atraumatic.  Eyes:     General:        Right eye: No discharge.        Left eye: No discharge.     Conjunctiva/sclera: Conjunctivae normal.  Cardiovascular:     Rate and Rhythm: Regular rhythm. Tachycardia present.     Pulses:          Dorsalis pedis pulses are 2+ on the right side and 2+ on the left side.       Posterior tibial pulses are 2+ on the right side and 2+ on the left side.  Pulmonary:     Effort: Pulmonary effort is normal. No respiratory distress.     Breath sounds: Normal breath sounds. No wheezing, rhonchi or rales.   Abdominal:     General: There is no distension.     Palpations: Abdomen is soft.     Tenderness: There is no abdominal tenderness.  Musculoskeletal:     Cervical back: Neck supple.     Comments: Lower extremities: Right lower extremity digits are somewhat erythematous more so to the fourth/fifth digits, patient has some skin breakdown as well as some purulence in the webspace of the fourth/fifth digits.  There is some erythema with warmth to the touch to the lateral portion of the forefoot.  Swelling noted to the forefoot as well.  Patient has trace symmetric pitting  edema to the bilateral lower legs.  Tender to palpation over the right fifth digit as well as the right lateral forefoot.  Otherwise nontender.  No calf tenderness.  Able to move all digits some.  Good range of motion at the ankles and knees.  Skin:    General: Skin is warm and dry.     Capillary Refill: Capillary refill takes less than 2 seconds.     Findings: No rash.  Neurological:     Mental Status: She is alert.     Comments: Alert. Clear speech. Sensation grossly intact to bilateral lower extremities. 5/5 strength with plantar/dorsiflexion bilaterally.   Psychiatric:        Mood and Affect: Mood normal.        Behavior: Behavior normal.         ED Results / Procedures / Treatments   Labs (all labs ordered are listed, but only abnormal results are displayed) Labs Reviewed  CBG MONITORING, ED - Abnormal; Notable for the following components:      Result Value   Glucose-Capillary 493 (*)    All other components within normal limits  BASIC METABOLIC PANEL  CBC WITH DIFFERENTIAL/PLATELET  URINALYSIS, ROUTINE W REFLEX MICROSCOPIC    EKG None  Radiology DG Foot Complete Right  Result Date: 10/02/2020 CLINICAL DATA:  Right foot swelling and pain after trauma this week. EXAM: RIGHT FOOT COMPLETE - 3+ VIEW COMPARISON:  None. FINDINGS: Soft tissue swelling to the forefoot is seen on the lateral view. No acute  fracture or subluxation. No opaque foreign body or soft tissue gas. Small Achilles enthesophyte. IMPRESSION: Soft tissue swelling without fracture. Electronically Signed   By: Monte Fantasia M.D.   On: 10/02/2020 11:10    Procedures .Critical Care Performed by: Amaryllis Dyke, PA-C Authorized by: Amaryllis Dyke, PA-C    CRITICAL CARE Performed by: Kennith Maes   Total critical care time: 35 minutes  Critical care time was exclusive of separately billable procedures and treating other patients.  Critical care was necessary to treat or prevent imminent or life-threatening deterioration.  Critical care was time spent personally by me on the following activities: development of treatment plan with patient and/or surrogate as well as nursing, discussions with consultants, evaluation of patient's response to treatment, examination of patient, obtaining history from patient or surrogate, ordering and performing treatments and interventions, ordering and review of laboratory studies, ordering and review of radiographic studies, pulse oximetry and re-evaluation of patient's condition.   Medications Ordered in ED Medications  sodium chloride 0.9 % bolus 1,000 mL (1,000 mLs Intravenous New Bag/Given 10/02/20 1023)    ED Course  I have reviewed the triage vital signs and the nursing notes.  Pertinent labs & imaging results that were available during my care of the patient were reviewed by me and considered in my medical decision making (see chart for details).    MDM Rules/Calculators/A&P                         Patient presents to the ED with complaints of R foot pain/swelling x 1 week.  Patient tachycardic on arrival, oral temp 99.1.  Additional history obtained:  Additional history obtained from chart review & nursing note review.   Lab Tests:  I Ordered, reviewed, and interpreted labs, which included:  CBC: Fairly unremarkable BMP: Findings concerning for DKA  with hyperglycemia, bicarb 16, gap of 17 Urinalysis: Ketonuria and glucosuria, no UTI  Imaging Studies  ordered:  I ordered imaging studies which included right foot, I independently reviewed, formal radiology impression shows:  Soft tissue swelling without fracture.   ED Course:  Patient with findings concerning for DKA as well as right foot cellulitis.  Will start insulin drip and IV antibiotics with plan for admission at this time.  Patient updated on results and plan of care and is in agreement  11:52: CONSULT: Discussed with hospitalist Dr. Neysa Bonito- accepts admission.   Findings and plan of care discussed with supervising physician Dr. Melina Copa who is in agreement.   Portions of this note were generated with [employer]. Dictation errors may occur despite best attempts at proofreading.  Final Clinical Impression(s) / ED Diagnoses Final diagnoses:  Diabetic foot infection ([place])  Diabetic ketoacidosis without coma associated with type 2 diabetes mellitus [facility])    Rx / DC Orders ED Discharge Orders    None       Amaryllis Dyke, PA-C 10/02/20 1314    Hayden Rasmussen, MD 10/02/20 1756

## 2020-10-03 ENCOUNTER — Observation Stay (HOSPITAL_BASED_OUTPATIENT_CLINIC_OR_DEPARTMENT_OTHER): Payer: Self-pay

## 2020-10-03 ENCOUNTER — Observation Stay (HOSPITAL_COMMUNITY): Payer: Self-pay

## 2020-10-03 DIAGNOSIS — L0390 Cellulitis, unspecified: Secondary | ICD-10-CM

## 2020-10-03 LAB — C-REACTIVE PROTEIN: CRP: 5.9 mg/dL — ABNORMAL HIGH (ref <1.0)

## 2020-10-03 LAB — BASIC METABOLIC PANEL
Anion gap: 12 (ref 5–15)
Anion gap: 10 (ref 5–15)
BUN: 9 mg/dL (ref 6–20)
BUN: 7 mg/dL (ref 6–20)
CO2: 19 mmol/L — ABNORMAL LOW (ref 22–32)
CO2: 19 mmol/L — ABNORMAL LOW (ref 22–32)
Calcium: 8.4 mg/dL — ABNORMAL LOW (ref 8.9–10.3)
Calcium: 8.5 mg/dL — ABNORMAL LOW (ref 8.9–10.3)
Chloride: 102 mmol/L (ref 98–111)
Chloride: 104 mmol/L (ref 98–111)
Creatinine, Ser: 0.75 mg/dL (ref 0.61–1.24)
Creatinine, Ser: 0.66 mg/dL (ref 0.61–1.24)
GFR, Estimated: >60 mL/min (ref >60)
GFR, Estimated: >60 mL/min (ref >60)
Glucose, Bld: 255 mg/dL — ABNORMAL HIGH (ref 70–99)
Glucose, Bld: 191 mg/dL — ABNORMAL HIGH (ref 70–99)
Potassium: 3.9 mmol/L (ref 3.5–5.1)
Potassium: 3.3 mmol/L — ABNORMAL LOW (ref 3.5–5.1)
Sodium: 133 mmol/L — ABNORMAL LOW (ref 135–145)
Sodium: 133 mmol/L — ABNORMAL LOW (ref 135–145)

## 2020-10-03 LAB — GLUCOSE, CAPILLARY
Glucose-Capillary: 234 mg/dL — ABNORMAL HIGH (ref 70–99)
Glucose-Capillary: 280 mg/dL — ABNORMAL HIGH (ref 70–99)
Glucose-Capillary: 184 mg/dL — ABNORMAL HIGH (ref 70–99)
Glucose-Capillary: 195 mg/dL — ABNORMAL HIGH (ref 70–99)
Glucose-Capillary: 199 mg/dL — ABNORMAL HIGH (ref 70–99)
Glucose-Capillary: 315 mg/dL — ABNORMAL HIGH (ref 70–99)
Glucose-Capillary: 164 mg/dL — ABNORMAL HIGH (ref 70–99)

## 2020-10-03 LAB — FERRITIN: Ferritin: 218 ng/mL (ref 24–336)

## 2020-10-03 LAB — CBC
HCT: 40.1 % (ref 39.0–52.0)
Hemoglobin: 13.8 g/dL (ref 13.0–17.0)
MCH: 30.5 pg (ref 26.0–34.0)
MCHC: 34.4 g/dL (ref 30.0–36.0)
MCV: 88.5 fL (ref 80.0–100.0)
Platelets: 177 10*3/uL (ref 150–400)
RBC: 4.53 MIL/uL (ref 4.22–5.81)
RDW: 16.8 % — ABNORMAL HIGH (ref 11.5–15.5)
WBC: 4 10*3/uL (ref 4.0–10.5)
nRBC: 0 % (ref 0.0–0.2)

## 2020-10-03 LAB — HEMOGLOBIN A1C
Hgb A1c MFr Bld: >15.5 % — ABNORMAL HIGH (ref 4.8–5.6)
Mean Plasma Glucose: >398 mg/dL

## 2020-10-03 LAB — BETA-HYDROXYBUTYRIC ACID: Beta-Hydroxybutyric Acid: 3.33 mmol/L — ABNORMAL HIGH (ref 0.05–0.27)

## 2020-10-03 LAB — D-DIMER, QUANTITATIVE: D-Dimer, Quant: 0.66 ug/mL-FEU — ABNORMAL HIGH (ref 0.00–0.50)

## 2020-10-03 MED ORDER — INSULIN ASPART 100 UNIT/ML ~~LOC~~ SOLN
6.0000 [IU] | Freq: Once | SUBCUTANEOUS | Status: AC
  Administered 2020-10-03: 6 [IU] via SUBCUTANEOUS

## 2020-10-03 MED ORDER — INSULIN ASPART PROT & ASPART (70-30 MIX) 100 UNIT/ML ~~LOC~~ SUSP
25.0000 [IU] | Freq: Two times a day (BID) | SUBCUTANEOUS | Status: DC
  Administered 2020-10-03 – 2020-10-04 (×2): 25 [IU] via SUBCUTANEOUS
  Filled 2020-10-03: qty 10

## 2020-10-03 MED ORDER — INSULIN ASPART 100 UNIT/ML ~~LOC~~ SOLN
3.0000 [IU] | Freq: Once | SUBCUTANEOUS | Status: AC
  Administered 2020-10-03: 3 [IU] via SUBCUTANEOUS

## 2020-10-03 MED ORDER — LACTATED RINGERS IV BOLUS
1000.0000 mL | Freq: Once | INTRAVENOUS | Status: AC
  Administered 2020-10-03: 1000 mL via INTRAVENOUS

## 2020-10-03 MED ORDER — IOHEXOL 300 MG/ML  SOLN
75.0000 mL | Freq: Once | INTRAMUSCULAR | Status: AC | PRN
  Administered 2020-10-03: 75 mL via INTRAVENOUS

## 2020-10-03 NOTE — Progress Notes (Signed)
PROGRESS NOTE    Jeremiah Stevens  IZT:[id number] DOB: 1975-12-23 DOA: 10/02/2020 PCP: Judyann Munson, MD   Chief Complaint  Patient presents with  . Hyperglycemia  . Leg Swelling    Brief Narrative:  This is a 45 year old transgender male with a past medical history of diabetes, HIV, hypertension, hyperlipidemia who presented to the ED with complaints of right foot pain and swelling x1 week.  Patient stubbed her right fifth toe on a cabinet roughly 1 week ago.  She stepped into a tub and felt like her skin cracked a bit.  Has since had progressively worsening discomfort and swelling with development of redness. Also noted some white drainage from the toe this prior to admission.  She's been admitted for DKA and R diabetic foot infection.   Assessment & Plan:   Principal Problem:   DKA (diabetic ketoacidosis) (HCC) Active Problems:   HTN (hypertension)   HIV-1 associated autonomic neuropathy (HCC)   Cellulitis in diabetic foot (HCC)  1. Mild-moderate DKA, likely multifactorial: Insulin noncompliance, poor diet and new infection a. Gap closed b. HbA1c > 15.5, poorly controlled c. Will transition to 70/30 given cost as barrier  d. Dietitian consult for education e. Diabetic coordinator consult to assist with getting insulin and supplies  2. SIRS likely secondary to purulent right foot cellulitis a. SIRS criteria: Tachycardic and tachypneic b. Patient is immunocompromised due to HIV and poorly controlled diabetes  c. Continue vancomycin at this time d. ABI with normal bilateral ankle brachial indices e. 3/23 plain film of R foot without evidence of osteo f. 3/24, follow CT scan R foot g. Needs podiatry/ortho follow outpatient  3. HIV a. Continue Biktarvy and valacyclovir  4. Hypertension a. Continue home amlodipine when tolerating p.o. b. Hold HCTZ c. Labetalol as needed  5. Incidental COVID 19 positive a. Start remdesivir for 3 days as she is immunocompromised and  obese b. Incentive spirometer  c. Combivent PRN  DVT prophylaxis: lovenox Code Status: full  Family Communication: none at bedside Disposition:   Status is: Observation  The patient will require care spanning > 2 midnights and should be moved to inpatient because: Inpatient level of care appropriate due to severity of illness - need for IV abx, furthter w/u with CT scan  Dispo: The patient is from: Home              Anticipated d/c is to: Home              Patient currently is not medically stable to d/c.   Difficult to place patient No       Consultants:   none  Procedures:  ABI Summary:  Right: Resting right ankle-brachial index is within normal range. No  evidence of significant right lower extremity arterial disease. The right  toe-brachial index is normal.   Left: Resting left ankle-brachial index is within normal range. No  evidence of significant left lower extremity arterial disease. The left  toe-brachial index is normal.   Antimicrobials:  Anti-infectives (From admission, onward)   Start     Dose/Rate Route Frequency Ordered Stop   10/03/20 1000  remdesivir 100 mg in sodium chloride 0.9 % 100 mL IVPB       "Followed by" Linked Group Details   100 mg 200 mL/hr over 30 Minutes Intravenous Daily 10/02/20 1516 10/05/20 0959   10/03/20 0200  vancomycin (VANCOREADY) IVPB 1500 mg/300 mL        1,500 mg 150 mL/hr over 120 Minutes Intravenous  Every 12 hours 10/02/20 1227     10/02/20 1630  remdesivir 200 mg in sodium chloride 0.9% 250 mL IVPB       "Followed by" Linked Group Details   200 mg 580 mL/hr over 30 Minutes Intravenous Once 10/02/20 1516 10/02/20 1828   10/02/20 1400  bictegravir-emtricitabine-tenofovir AF (BIKTARVY) 50-200-25 MG per tablet 1 tablet        1 tablet Oral Daily 10/02/20 1213     10/02/20 1400  ceFEPIme (MAXIPIME) 2 g in sodium chloride 0.9 % 100 mL IVPB  Status:  Discontinued        2 g 200 mL/hr over 30 Minutes Intravenous Every 8  hours 10/02/20 1232 10/03/20 1055   10/02/20 1245  valACYclovir (VALTREX) tablet 500 mg       Note to Pharmacy: TAKE 1 TABLET(500 MG) BY MOUTH TWICE DAILY     500 mg Oral 2 times daily 10/02/20 1213     10/02/20 1230  vancomycin (VANCOCIN) 2,500 mg in sodium chloride 0.9 % 500 mL IVPB        2,500 mg 250 mL/hr over 120 Minutes Intravenous  Once 10/02/20 1227 10/02/20 1600   10/02/20 1215  vancomycin (VANCOCIN) IVPB 1000 mg/200 mL premix  Status:  Discontinued        1,000 mg 200 mL/hr over 60 Minutes Intravenous  Once 10/02/20 1212 10/02/20 1226   10/02/20 1145  piperacillin-tazobactam (ZOSYN) IVPB 3.375 g  Status:  Discontinued        3.375 g 100 mL/hr over 30 Minutes Intravenous  Once 10/02/20 1130 10/02/20 1212         Subjective: No new complaints  Objective: Vitals:   10/03/20 1140 10/03/20 1200 10/03/20 1300 10/03/20 1400  BP:    (!) 154/86  Pulse:  96 (!) 103 (!) 110  Resp:      Temp: 98.7 F (37.1 C)     TempSrc: Oral     SpO2:  95% 94% 91%  Weight:      Height:        Intake/Output Summary (Last 24 hours) at 10/03/2020 1522 Last data filed at 10/03/2020 1413 Gross per 24 hour  Intake 5007.85 ml  Output 2200 ml  Net 2807.85 ml   Filed Weights   10/02/20 1801  Weight: 118.8 kg    Examination:  General exam: Appears calm and comfortable  Respiratory system: Clear to auscultation. Respiratory effort normal. Cardiovascular system: S1 & S2 heard, RRR.  Gastrointestinal system: Abdomen is nondistended, soft and nontender Central nervous system: Alert and oriented. No focal neurological deficits. Extremities: Right lower extremity with wound between 4th and 5th toe, doesn't appear to be draining at this time Psychiatry: Judgement and insight appear normal. Mood & affect appropriate.     Data Reviewed: I have personally reviewed following labs and imaging studies  CBC: Recent Labs  Lab 10/02/20 1013 10/03/20 0415  WBC 5.1 4.0  NEUTROABS 2.6  --   HGB  14.7 13.8  HCT 41.6 40.1  MCV 87.6 88.5  PLT 212 177    Basic Metabolic Panel: Recent Labs  Lab 10/02/20 1332 10/02/20 1655 10/02/20 2038 10/03/20 0020 10/03/20 0415  NA 137 135 131* 133* 133*  K 3.6 3.3* 3.7 3.9 3.3*  CL 104 105 102 102 104  CO2 19* 18* 17* 19* 19*  GLUCOSE 179* 169* 307* 255* 191*  BUN 10 9 8 9 7   CREATININE 1.00 0.73 0.68 0.75 0.66  CALCIUM 9.2 8.5* 8.2* 8.4* 8.5*  MG 2.3  --   --   --   --     GFR: Estimated Creatinine Clearance (by C-G formula based on SCr of 0.66 mg/dL) Male: 161.0 mL/min Male: 156.8 mL/min  Liver Function Tests: No results for input(s): AST, ALT, ALKPHOS, BILITOT, PROT, ALBUMIN in the last 168 hours.  CBG: Recent Labs  Lab 10/03/20 0000 10/03/20 0203 10/03/20 0511 10/03/20 0752 10/03/20 1137  GLUCAP 280* 234* 184* 195* 199*     Recent Results (from the past 240 hour(s))  Resp Panel by RT-PCR (Flu A&B, Covid) Nasopharyngeal Swab     Status: Abnormal   Collection Time: 10/02/20 11:53 AM   Specimen: Nasopharyngeal Swab; Nasopharyngeal(NP) swabs in vial transport medium  Result Value Ref Range Status   SARS Coronavirus 2 by RT PCR POSITIVE (A) NEGATIVE Final    Comment: RESULT CALLED TO, READ BACK BY AND VERIFIED WITH: AUSTIN RN AT 1337 ON 10/02/20 BY S.VANHOORNE (NOTE) SARS-CoV-2 target nucleic acids are DETECTED.  The SARS-CoV-2 RNA is generally detectable in upper respiratory specimens during the acute phase of infection. Positive results are indicative of the presence of the identified virus, but do not rule out bacterial infection or co-infection with other pathogens not detected by the test. Clinical correlation with patient history and other diagnostic information is necessary to determine patient infection status. The expected result is Negative.  Fact Sheet for Patients: BloggerCourse.com  Fact Sheet for Healthcare Providers: SeriousBroker.it  This test  is not yet approved or cleared by the [place] FDA and  has been authorized for detection and/or diagnosis of SARS-CoV-2 by FDA under an Emergency Use Authorization (EUA).  This EUA will remain in effect (meaning this t est can be used) for the duration of  the COVID-19 declaration under Section 564(b)(1) of the Act, 21 U.S.C. section 360bbb-3(b)(1), unless the authorization is terminated or revoked sooner.     Influenza A by PCR NEGATIVE NEGATIVE Final   Influenza B by PCR NEGATIVE NEGATIVE Final    Comment: (NOTE) The Xpert Xpress SARS-CoV-2/FLU/RSV plus assay is intended as an aid in the diagnosis of influenza from Nasopharyngeal swab specimens and should not be used as a sole basis for treatment. Nasal washings and aspirates are unacceptable for Xpert Xpress SARS-CoV-2/FLU/RSV testing.  Fact Sheet for Patients: BloggerCourse.com  Fact Sheet for Healthcare Providers: SeriousBroker.it  This test is not yet approved or cleared by the [place] FDA and has been authorized for detection and/or diagnosis of SARS-CoV-2 by FDA under an Emergency Use Authorization (EUA). This EUA will remain in effect (meaning this test can be used) for the duration of the COVID-19 declaration under Section 564(b)(1) of the Act, 21 U.S.C. section 360bbb-3(b)(1), unless the authorization is terminated or revoked.  Performed at [facility], [address] [place], [place] [address]   MRSA PCR Screening     Status: None   Collection Time: 10/02/20  1:33 PM   Specimen: Nasopharyngeal  Result Value Ref Range Status   MRSA by PCR NEGATIVE NEGATIVE Final    Comment:        The GeneXpert MRSA Assay (FDA approved for NASAL specimens only), is one component of a comprehensive MRSA colonization surveillance program. It is not intended to diagnose MRSA infection nor to guide or monitor treatment for MRSA  infections. Performed at [facility], [address] [place], [place] [address]          Radiology Studies: DG Foot Complete Right  Result  Date: 10/02/2020 CLINICAL DATA:  Right foot swelling and pain after trauma this week. EXAM: RIGHT FOOT COMPLETE - 3+ VIEW COMPARISON:  None. FINDINGS: Soft tissue swelling to the forefoot is seen on the lateral view. No acute fracture or subluxation. No opaque foreign body or soft tissue gas. Small Achilles enthesophyte. IMPRESSION: Soft tissue swelling without fracture. Electronically Signed   By: Marnee SpringJonathon  Watts M.D.   On: 10/02/2020 11:10   VAS US ABI WITH/WO TBI  Result Date: 10/03/2020 LOWER EXTREMITY DOPPLER STUDY Indications: Ulceration RT 5th toe, s/p injury. High Risk Factors: Hypertension, hyperlipidemia, Diabetes, current smoker.  Comparison Study: No prior studies. Performing Technologist: Jean RosenthalHodge, Rachel RDMS, RVT  Examination Guidelines: A complete evaluation includes at minimum, Doppler waveform signals and systolic blood pressure reading at the level of bilateral brachial, anterior tibial, and posterior tibial arteries, when vessel segments are accessible. Bilateral testing is considered an integral part of a complete examination. Photoelectric Plethysmograph (PPG) waveforms and toe systolic pressure readings are included as required and additional duplex testing as needed. Limited examinations for reoccurring indications may be performed as noted.  ABI Findings: +---------+------------------+-----+---------+--------+ Right    Rt Pressure (mmHg)IndexWaveform Comment  +---------+------------------+-----+---------+--------+ Brachial 157                    triphasic         +---------+------------------+-----+---------+--------+ PTA      187               1.19 triphasic         +---------+------------------+-----+---------+--------+ DP       182               1.16 triphasic          +---------+------------------+-----+---------+--------+ Great Toe140               0.89 Normal            +---------+------------------+-----+---------+--------+ +---------+------------------+-----+---------+---------------------------------+ Left     Lt Pressure (mmHg)IndexWaveform Comment                           +---------+------------------+-----+---------+---------------------------------+ Brachial                        triphasicUnable to obtain pressure due to                                           IV/bandaging                      +---------+------------------+-----+---------+---------------------------------+ PTA      195               1.24 triphasic                                  +---------+------------------+-----+---------+---------------------------------+ DP       193               1.23 triphasic                                  +---------+------------------+-----+---------+---------------------------------+ Great Toe144               0.92 Normal                                     +---------+------------------+-----+---------+---------------------------------+ +-------+-----------+-----------+------------+------------+  ABI/TBIToday's ABIToday's TBIPrevious ABIPrevious TBI +-------+-----------+-----------+------------+------------+ Right  1.19       0.89                                +-------+-----------+-----------+------------+------------+ Left   1.24       0.92                                +-------+-----------+-----------+------------+------------+  Summary: Right: Resting right ankle-brachial index is within normal range. No evidence of significant right lower extremity arterial disease. The right toe-brachial index is normal. Left: Resting left ankle-brachial index is within normal range. No evidence of significant left lower extremity arterial disease. The left toe-brachial index is normal.  *See table(s) above for measurements  and observations.  Electronically signed by Sherald Hess MD on 10/03/2020 at 1:03:53 PM.    Final         Scheduled Meds: . amLODipine  10 mg Oral Daily  . bictegravir-emtricitabine-tenofovir AF  1 tablet Oral Daily  . Chlorhexidine Gluconate Cloth  6 each Topical Daily  . enoxaparin (LOVENOX) injection  40 mg Subcutaneous Q24H  . insulin aspart  0-20 Units Subcutaneous TID WC  . insulin aspart protamine- aspart  25 Units Subcutaneous BID WC  . mouth rinse  15 mL Mouth Rinse BID  . nicotine  14 mg Transdermal Daily  . PARoxetine  10 mg Oral Daily  . valACYclovir  500 mg Oral BID   Continuous Infusions: . sodium chloride Stopped (10/03/20 0030)  . lactated ringers Stopped (10/03/20 1326)  . remdesivir 100 mg in NS 100 mL Stopped (10/03/20 1038)  . vancomycin 150 mL/hr at 10/03/20 1413     LOS: 0 days    Time spent: over 30 min    Lacretia Nicks, MD [facility]   To contact the attending provider between 7A-7P or the covering provider during after hours 7P-7A, please log into the web site [email or web address] and access using universal [place] password for that web site. If you do not have the password, please call the hospital operator.  10/03/2020, 3:22 PM

## 2020-10-03 NOTE — Discharge Instructions (Signed)

## 2020-10-03 NOTE — TOC Initial Note (Signed)
Transition of Care [facility]) - Initial/Assessment Note    Patient Details  Name: Jeremiah Stevens MRN: [id number] Date of Birth: [birth year]  Transition [facility]) CM/SW Contact:    Golda Acre, RN Phone Number: 10/03/2020, 8:22 AM  Clinical Narrative:                 45 year old male (Male gender identity) with a past medical history of diabetes, HIV, hypertension, hyperlipidemia who presented to the ED with complaints of right foot pain and swelling x1 week.  Patient stubbed her right fifth toe on a cabinet roughly 1 week ago.  She stepped into a tub and felt like her skin cracked a bit.  Has since had progressively worsening discomfort and swelling with development of redness. Also noted some white drainage from the toe this morning.  Denies fever, chills, nausea vomiting or any other complaints.  Of note, has not been taking his insulin at home due to financial constraints and does not recall the last time she took insulin. Reports she adheres to a diabetic diet with the exception of this week when she has been eating very poorly.    ED Course: Afebrile, tachycardic, tachypneic,  hemodynamically stable, on room air. Notable Labs: Sodium 131, K4.8, CO2 16, glucose 477, BUN 12, creatinine 1.07, AG 17, WBC 5.1, Hb 14.7, COVID-19 pending. Notable Imaging: Right foot XR-soft tissue swelling without fracture. Patient received Zosyn, Tylenol, 1 L NS bolus and insulin drip.  PLAN: to return to home once stable.  Covid test is positive.  Will follow for home needs. Expected Discharge Plan: Home/Self Care Barriers to Discharge: Continued Medical Work up   Patient Goals and CMS Choice Patient states their goals for this hospitalization and ongoing recovery are:: to go back home and ber well CMS Medicare.gov Compare Post Acute Care list provided to:: Patient    Expected Discharge Plan and Services Expected Discharge Plan: Home/Self Care   Discharge Planning Services: CM Consult,Other - See  comment (diabetic coorindator)   Living arrangements for the past 2 months: Single Family Home                                      Prior Living Arrangements/Services Living arrangements for the past 2 months: Single Family Home Lives with:: Self Patient language and need for interpreter reviewed:: Yes        Need for Family Participation in Patient Care: No (Comment) Care giver support system in place?: No (comment)   Criminal Activity/Legal Involvement Pertinent to Current Situation/Hospitalization: No - Comment as needed  Activities of Daily Living Home Assistive Devices/Equipment: None ADL Screening (condition at time of admission) Patient's cognitive ability adequate to safely complete daily activities?: Yes Is the patient deaf or have difficulty hearing?: No Does the patient have difficulty seeing, even when wearing glasses/contacts?: No Does the patient have difficulty concentrating, remembering, or making decisions?: No Patient able to express need for assistance with ADLs?: Yes Does the patient have difficulty dressing or bathing?: No Independently performs ADLs?: Yes (appropriate for developmental age) Does the patient have difficulty walking or climbing stairs?: No Weakness of Legs: Right Weakness of Arms/Hands: None  Permission Sought/Granted                  Emotional Assessment Appearance:: Appears stated age Attitude/Demeanor/Rapport: Engaged Affect (typically observed): Calm Orientation: : Oriented to Place,Oriented to Self,Oriented to  Time,Oriented to  Situation Alcohol / Substance Use: Not Applicable Psych Involvement: No (comment)  Admission diagnosis:  DKA (diabetic ketoacidosis) (HCC) [E11.10] Diabetic foot infection (HCC) [U27.253, L08.9] Diabetic ketoacidosis without coma associated with type 2 diabetes mellitus (HCC) [E11.10] Patient Active Problem List   Diagnosis Date Noted  . DKA (diabetic ketoacidosis) (HCC) 10/02/2020  .  Cellulitis in diabetic foot (HCC) 10/02/2020  . Type 2 diabetes mellitus with hyperosmolar nonketotic hyperglycemia (HCC) 02/10/2020  . Hyperglycemic hyperosmolar nonketotic coma (HCC) 02/10/2020  . Gender identity disorder 02/10/2020  . Non-suicidal depressed mood 07/01/2018  . Migraine 08/02/2013  . 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 Human immunodeficiency virus (HIV) disease 10/27/2011  . HTN (hypertension) 10/27/2011  . Hypertriglyceridemia 10/27/2011  . Herpes 10/27/2011   PCP:  Judyann Munson, MD Pharmacy:   [facility] [phone] Ginette Otto, [place] - [address] AT [facility] [address] [place] [address] Phone: [phone] Fax: [phone]  [facility] [place], [place] - [address] [place] [address] Phone: [phone] Fax: [phone]     Social Determinants of Health (SDOH) Interventions    Readmission Risk Interventions No flowsheet data found.

## 2020-10-03 NOTE — Progress Notes (Signed)
Inpatient Diabetes Program Recommendations  AACE/ADA: New Consensus Statement on Inpatient Glycemic Control (2015)  Target Ranges:  Prepandial:   less than 140 mg/dL      Peak postprandial:   less than 180 mg/dL (1-2 hours)      Critically ill patients:  140 - 180 mg/dL   Lab Results  Component Value Date   GLUCAP 195 (H) 10/03/2020   HGBA1C 8.7 (H) 05/22/2020    Review of Glycemic Control  Diabetes history: DM2 Outpatient Diabetes medications: Lantus 45 units QD, Novolog 0-15 units TID with meals (not taking d/t costs) Current orders for Inpatient glycemic control: Lantus 30 Q24H, Novolog 0-20 units TID + 6 units TID with meals  Inpatient Diabetes Program Recommendations:     Increase Lantus to 35 units Q24H Increase Novolog to 9 units TID with meals  Will speak with pt about his diabetes and HgbA1C of 8.7%. Will need assistance with obtaining insulin at discharge.   Follow closely.  Thank you. Ailene Ards, RD, LDN, CDE Inpatient Diabetes Coordinator [phone]

## 2020-10-03 NOTE — Progress Notes (Signed)
NUTRITION NOTE  RD consulted for nutrition education regarding diabetes.   Lab Results  Component Value Date   HGBA1C 8.7 (H) 05/22/2020   Patient with hx of type 2 DM. Patient was admitted for DKA and was found to be COVID-19 positive while in the ED.   Diabetes Coordinator is following. Patient had reported to CM/TOC that he was doing well with adhering to a DM diet until 1 week PTA at which time PO intakes became poor d/t feeling unwell.   Patient is Observation status. He would most benefit from outpatient diet counseling once he is no longer COVID positive. This acute illness, in addition to potentially uncontrolled DM, is contributing to hyperglycemia.   Will place order for outpatient education and will place "Carbohydrate Counting for People with Diabetes" handout from the Academy of Nutrition and Dietetics in patient's Discharge Instructions.   Expect fair compliance given current HgbA1c.  Body mass index is 35.52 kg/m. Pt meets criteria for obesity based on current BMI.  Diet was advanced from NPO to Heart Healthy/Carb Modified yesterday at 1737 and no intakes have been documented since that time.  Labs reviewed; CBGs: 280, 234, 184, 195 mg/dl, K: 3.3 mmol/l. Medications reviewed; sliding scale novolog, 6 units novolog TID, 10 mEq IV KCl x2 run 3/23.   No further nutrition interventions warranted at this time. RD contact information provided. If additional nutrition issues arise, please re-consult RD.      Trenton Gammon, MS, RD, LDN, CNSC Inpatient Clinical Dietitian RD pager # available in AMION  After hours/weekend pager # available in AMION .

## 2020-10-03 NOTE — Progress Notes (Signed)
ABI w/ TBI study completed.  Preliminary results relayed to RN on the floor.   See CV Proc for preliminary results report.   Jean Rosenthal, RDMS, RVT

## 2020-10-04 ENCOUNTER — Encounter (HOSPITAL_COMMUNITY): Payer: Self-pay | Admitting: Family Medicine

## 2020-10-04 ENCOUNTER — Inpatient Hospital Stay (HOSPITAL_COMMUNITY): Payer: Self-pay

## 2020-10-04 DIAGNOSIS — R7989 Other specified abnormal findings of blood chemistry: Secondary | ICD-10-CM

## 2020-10-04 DIAGNOSIS — U071 COVID-19: Secondary | ICD-10-CM

## 2020-10-04 LAB — BASIC METABOLIC PANEL
Anion gap: 10 (ref 5–15)
BUN: 7 mg/dL (ref 6–20)
CO2: 28 mmol/L (ref 22–32)
Calcium: 8.9 mg/dL (ref 8.9–10.3)
Chloride: 99 mmol/L (ref 98–111)
Creatinine, Ser: 0.77 mg/dL (ref 0.61–1.24)
GFR, Estimated: >60 mL/min (ref >60)
Glucose, Bld: 224 mg/dL — ABNORMAL HIGH (ref 70–99)
Potassium: 3.3 mmol/L — ABNORMAL LOW (ref 3.5–5.1)
Sodium: 137 mmol/L (ref 135–145)

## 2020-10-04 LAB — GLUCOSE, CAPILLARY
Glucose-Capillary: 231 mg/dL — ABNORMAL HIGH (ref 70–99)
Glucose-Capillary: 376 mg/dL — ABNORMAL HIGH (ref 70–99)

## 2020-10-04 LAB — D-DIMER, QUANTITATIVE: D-Dimer, Quant: 0.74 ug/mL-FEU — ABNORMAL HIGH (ref 0.00–0.50)

## 2020-10-04 LAB — CBC
HCT: 42.1 % (ref 39.0–52.0)
Hemoglobin: 14.3 g/dL (ref 13.0–17.0)
MCH: 30.1 pg (ref 26.0–34.0)
MCHC: 34 g/dL (ref 30.0–36.0)
MCV: 88.6 fL (ref 80.0–100.0)
Platelets: 203 10*3/uL (ref 150–400)
RBC: 4.75 MIL/uL (ref 4.22–5.81)
RDW: 16.6 % — ABNORMAL HIGH (ref 11.5–15.5)
WBC: 3.5 10*3/uL — ABNORMAL LOW (ref 4.0–10.5)
nRBC: 0 % (ref 0.0–0.2)

## 2020-10-04 LAB — FERRITIN: Ferritin: 232 ng/mL (ref 24–336)

## 2020-10-04 LAB — C-REACTIVE PROTEIN: CRP: 4.4 mg/dL — ABNORMAL HIGH (ref <1.0)

## 2020-10-04 MED ORDER — INSULIN ASPART PROT & ASPART (70-30 MIX) 100 UNIT/ML ~~LOC~~ SUSP: 30.0000 [IU] | Freq: Two times a day (BID) | SUBCUTANEOUS | Status: DC

## 2020-10-04 MED ORDER — METOPROLOL TARTRATE 25 MG PO TABS: 25.0000 mg | ORAL_TABLET | Freq: Two times a day (BID) | ORAL | 2 refills | Status: DC

## 2020-10-04 MED ORDER — AMLODIPINE BESYLATE 10 MG PO TABS: 10.0000 mg | ORAL_TABLET | Freq: Every day | ORAL | 3 refills | Status: DC

## 2020-10-04 MED ORDER — IOHEXOL 350 MG/ML SOLN
80.0000 mL | Freq: Once | INTRAVENOUS | Status: AC | PRN
  Administered 2020-10-04: 80 mL via INTRAVENOUS

## 2020-10-04 MED ORDER — BLOOD GLUCOSE MONITOR KIT: PACK | 0 refills | Status: DC

## 2020-10-04 MED ORDER — NOVOLOG 70/30 FLEXPEN RELION (70-30) 100 UNIT/ML ~~LOC~~ SUPN
30.0000 [IU] | PEN_INJECTOR | Freq: Two times a day (BID) | SUBCUTANEOUS | 3 refills | Status: DC
Start: 2020-10-04 — End: 2022-08-02

## 2020-10-04 MED ORDER — PAROXETINE HCL 10 MG PO TABS: 10.0000 mg | ORAL_TABLET | Freq: Every day | ORAL | 0 refills | Status: DC

## 2020-10-04 MED ORDER — HYDROCHLOROTHIAZIDE 25 MG PO TABS
25.0000 mg | ORAL_TABLET | Freq: Every day | ORAL | Status: DC
  Administered 2020-10-04: 25 mg via ORAL
  Filled 2020-10-04 (×2): qty 1

## 2020-10-04 MED ORDER — POTASSIUM CHLORIDE CRYS ER 20 MEQ PO TBCR
40.0000 meq | EXTENDED_RELEASE_TABLET | Freq: Once | ORAL | Status: AC
  Administered 2020-10-04: 40 meq via ORAL
  Filled 2020-10-04: qty 2

## 2020-10-04 MED ORDER — DOXYCYCLINE MONOHYDRATE 100 MG PO TABS: 100.0000 mg | ORAL_TABLET | Freq: Two times a day (BID) | ORAL | 0 refills | Status: AC

## 2020-10-04 MED ORDER — HYDROCHLOROTHIAZIDE 25 MG PO TABS
25.0000 mg | ORAL_TABLET | Freq: Every day | ORAL | 3 refills | Status: DC
Start: 2020-10-04 — End: 2022-08-04

## 2020-10-04 NOTE — Progress Notes (Signed)
Inpatient Diabetes Program Recommendations  AACE/ADA: New Consensus Statement on Inpatient Glycemic Control (2015)  Target Ranges:  Prepandial:   less than 140 mg/dL      Peak postprandial:   less than 180 mg/dL (1-2 hours)      Critically ill patients:  140 - 180 mg/dL   Lab Results  Component Value Date   GLUCAP 231 (H) 10/04/2020   HGBA1C >15.5 (H) 10/02/2020    Review of Glycemic Control  Spoke with Jeremiah Stevens this morning about his uncontrolled DM, HgbA1C of > 15.5%, and need for assistance with obtaining PCP and affordable meds. Jeremiah Stevens does not have a PCP managing his diabetes. States he has lost a lot of weight in the past few months, likely from uncontrolled blood sugars. Jeremiah Stevens said he is now eating healthy, leaving off regular sodas and eating no sugary foods. Michela Pitcher he is feeling very motivated to take care of himself. Does not have meter at home to check blood sugars. Jeremiah Stevens asked for social work consult for information on housing assistance. Prefers insulin pen. Have switched to 70/30 insulin which is more affordable - $44 for prescription at [facility]. He would also benefit from having a correction insulin such as Novolin R to use when blood sugars are elevated.  Inpatient Diabetes Program Recommendations:     Increase 70/30 to 30 units BID  Will order [facility] consult for assistance with obtaining PCP to manage diabetes and social work consult per Jeremiah Stevens request.   Will give Jeremiah Stevens blood glucose meter kit and supplies.  Follow closely.  Thank you. Lorenda Peck, RD, LDN, CDE Inpatient Diabetes Coordinator [phone]

## 2020-10-04 NOTE — CV Procedure (Signed)
BLE venous duplex completed.  Results can be found under chart review under CV PROC. 10/04/2020 3:19 PM Ganon Demasi RVT, RDMS

## 2020-10-04 NOTE — Discharge Summary (Signed)
Physician Discharge Summary  SHIVAM MESTAS DXI:[id number] DOB: 07-17-1975 DOA: 10/02/2020  PCP: Carlyle Basques, MD  Admit date: 10/02/2020 Discharge date: 10/04/2020  Time spent: 40 minutes  Recommendations for Outpatient Follow-up:  1. Follow outpatient CBC/CMP 2. Follow blood sugars outpatient - adjust insulin regimen as needed 3. Follow covid isolation guidelines outpatient per CDC 4. Follow with podiatry/orthopedics  5. Follow hepatic steatosis outpatient  6. Follow 6 mm right upper lobe nodule (needs repeat CT scan within 6-12 months - then follow up imaging per rads) 7. Follow final LE [place] read (prelim neg)  Discharge Diagnoses:  Principal Problem:   DKA (diabetic ketoacidosis) ([place]) Active Problems:   HTN (hypertension)   HIV-1 associated autonomic neuropathy ([place])   Cellulitis in diabetic foot [facility])   Discharge Condition: stable  Diet recommendation: diabetic  Filed Weights   10/02/20 1801  Weight: 118.8 kg    History of present illness:  This is Jeremiah Stevens 45 year old transgender femalewith Zia Kanner past medical history of diabetes, HIV, hypertension, hyperlipidemia who presented to the ED with complaints of right foot pain and swelling x1 week. Patient stubbed herright fifth toe on Ameliyah Sarno cabinet roughly 1 week ago.Shestepped into Rennie Rouch tub and felt like her skin cracked Nareg Breighner bit. Has since had progressively worsening discomfort and swelling with development of redness. Also noted some white drainage from the toe this prior to admission.She's been admitted for DKA and R diabetic foot infection.  She improved with IV antibiotics and treatment of dka.  Imaging did not show any evidence of osteo or deep infection.  She was incidentally covid positive, treated with remdesivir x3 days.  She was stable at the time of discharge.  Discharged with 70/30 prescription of relion for cost.  See below for additional details  Hospital Course:   1. Mild-moderateDKA, likely multifactorial: Insulin  noncompliance, poor diet and new infection Tyera Hansley. Gap closed b. HbA1c > 15.5, poorly controlled c. Will transition to 70/30 given cost as barrier -> discharge with 30 units twice daily and follow up with PCP outpatient for additional adjustment  d. Dietitian consult for education e. Diabetic coordinator consult to assist with getting insulin and supplies  2. SIRS likely secondary to purulent right footcellulitis Carmita Boom. SIRS criteria: Tachycardic and tachypneic b. Patient is immunocompromised due to HIV andpoorly controlled diabetes c. Discharge on doxycycline x 5 days d. ABI with normal bilateral ankle brachial indices e. 3/23 plain film of R foot without evidence of osteo f. 3/24, follow CT scan R foot with soft tissue thickening along plantar and lateral aspects of 5th metatarsal head - no evidence of osteo by CT g. Needs podiatry/ortho follow outpatient  3. Sinus Tach 1. Noted in previous clinic visit.  With D dimer, follow CT PE protocol -> negative for PE (though limited due to resp motion), prelim LE [place] negative for DVT as well 2. Resume home metoprolol at discharge  4. HIV Iriel Nason. Continue Biktarvy and valacyclovir  4. Hypertension Ante Arredondo. Continue home amlodipine when tolerating p.o. b. Resume HCTZ c. Resume metoprolol at discharge  5. Pulmonary Nodule 1. Needs follow up within 6-12 months (see rad report)  6. Incidental COVID 19 positive Hawraa Stambaugh. S/p remdesivir for 3 days as she is immunocompromised and obese b. Incentive spirometer  c. Combivent PRN   Procedures: ABI Summary:  Right: Resting right ankle-brachial index is within normal range. No  evidence of significant right lower extremity arterial disease. The right  toe-brachial index is normal.   Left: Resting left ankle-brachial index is  within normal range. No  evidence of significant left lower extremity arterial disease. The left  toe-brachial index is normal.   Consultations:  none  Discharge Exam: Vitals:    10/04/20 1300 10/04/20 1400  BP: 125/67 114/77  Pulse: (!) 116 (!) 108  Resp: (!) 24 18  Temp:    SpO2: 92% 91%   No new complaints Eager to d/c hoome  General: No acute distress. Cardiovascular: tachycardic, regular  Lungs: Clear to auscultation bilaterally Abdomen: Soft, nontender, nondistended Neurological: Alert and oriented 3. Moves all extremities 4. Cranial nerves II through XII grossly intact. Skin: Warm and dry. No rashes or lesions. Extremities: No clubbing or cyanosis. No edema. Pedal pulses 2+.   Discharge Instructions   Discharge Instructions    Amb Referral to Nutrition and Diabetic Education   Complete by: As directed    Call MD for:  difficulty breathing, headache or visual disturbances   Complete by: As directed    Call MD for:  extreme fatigue   Complete by: As directed    Call MD for:  hives   Complete by: As directed    Call MD for:  persistant dizziness or light-headedness   Complete by: As directed    Call MD for:  persistant nausea and vomiting   Complete by: As directed    Call MD for:  redness, tenderness, or signs of infection (pain, swelling, redness, odor or green/yellow discharge around incision site)   Complete by: As directed    Call MD for:  severe uncontrolled pain   Complete by: As directed    Call MD for:  temperature >100.4   Complete by: As directed    Diet - low sodium heart healthy   Complete by: As directed    Discharge instructions   Complete by: As directed    You were seen for Authur Cubit diabetic foot infection.  You've improved with antibiotics and your imaging was reassuring.  We'll discharge you with 5 days doxycycline.  You should see Ellenor Wisniewski foot doctor for follow up.   You had Stephanny Tsutsui covid infection.  You received 3 days of remdesivir.  Isolate for 5 days at home, if you're asymptomatic after this, per CDC guidelines, you can discontinue your isolation and wear Harly Pipkins mask until 10 days after your positive test (don't go anywhere you can't wear  Dominion Kathan mask) (April 2nd).  Do not travel for at least 10 days after your positive covid test (after April 2nd).  You have Jarelly Rinck right upper lobe nodule which should be followed up with Sabriah Hobbins repeat CT scan in 6-12 months (then you'll need subsequent imaging as well, please follow up with PCP to discuss follow up recommendations).  You had DKA, we'll start you on 70/30 insulin as the [facility] 70/30 insulin is often cheaper.  Take 30 units twice daily with meals.  Try to eat consist meals with 70/30 insulin.  I'll print your prescription for your insulin so you can take this to [facility].  Return for new, recurrent, or worsening symptoms.  Please ask your PCP to request records from this hospitalization so they know what was done and what the next steps will be.  Return for new, recurrent, or worsening symptoms.  Please ask your PCP to request records from this hospitalization so they know what was done and what the next steps will be.   Increase activity slowly   Complete by: As directed      Allergies as of 10/04/2020  Reactions   Peanut-containing Drug Products Other (See Comments)   WALNUTS - SORES ON TONGUE   Sustiva [efavirenz] Rash      Medication List    STOP taking these medications   insulin aspart 100 UNIT/ML FlexPen Commonly known as: NOVOLOG   insulin glargine 100 UNIT/ML Solostar Pen Commonly known as: LANTUS     TAKE these medications   amLODipine 10 MG tablet Commonly known as: NORVASC Take 1 tablet (10 mg total) by mouth daily.   bictegravir-emtricitabine-tenofovir AF 50-200-25 MG Tabs tablet Commonly known as: BIKTARVY Take 1 tablet by mouth daily.   blood glucose meter kit and supplies Kit Dispense based on patient and insurance preference. Use up to four times daily as directed. What changed: additional instructions   doxycycline 100 MG tablet Commonly known as: ADOXA Take 1 tablet (100 mg total) by mouth 2 (two) times daily for 5 days.   hydrochlorothiazide  25 MG tablet Commonly known as: HYDRODIURIL Take 1 tablet (25 mg total) by mouth daily.   Insulin Pen Needle 32G X 4 MM Misc 1 Device by Does not apply route QID. For use with insulin pens   metoprolol tartrate 25 MG tablet Commonly known as: LOPRESSOR Take 1 tablet (25 mg total) by mouth 2 (two) times daily.   NovoLOG 70/30 FlexPen ReliOn (70-30) 100 UNIT/ML FlexPen Generic drug: insulin aspart protamine - aspart Inject 0.3 mLs (30 Units total) into the skin 2 (two) times daily.   PARoxetine 10 MG tablet Commonly known as: Paxil Take 1 tablet (10 mg total) by mouth daily. What changed:   when to take this  reasons to take this   polyethylene glycol 17 g packet Commonly known as: MIRALAX / GLYCOLAX Take 17 g by mouth daily as needed for moderate constipation.   valACYclovir 500 MG tablet Commonly known as: VALTREX TAKE 1 TABLET(500 MG) BY MOUTH TWICE DAILY What changed: See the new instructions.      Allergies  Allergen Reactions  . Peanut-Containing Drug Products Other (See Comments)    WALNUTS - SORES ON TONGUE  . Sustiva [Efavirenz] Rash    Follow-up Information    [facility]. Go on 11/14/2020.   Why: appointment time is at 1:50 pm.  please arrive 15 minutes before appointment time . Contact information: [place] [id number] [phone]       Carlyle Basques, MD Follow up.   Specialty: Infectious Diseases Why: Please call for Freman Lapage follow up appointment  Contact information: [place] [address] [phone]                The results of significant diagnostics from this hospitalization (including imaging, microbiology, ancillary and laboratory) are listed below for reference.    Significant Diagnostic Studies: CT ANGIO CHEST PE W OR WO CONTRAST  Result Date: 10/04/2020 CLINICAL DATA:  Positive D-dimer level. EXAM: CT ANGIOGRAPHY CHEST WITH CONTRAST  TECHNIQUE: Multidetector CT imaging of the chest was performed using the standard protocol during bolus administration of intravenous contrast. Multiplanar CT image reconstructions and MIPs were obtained to evaluate the vascular anatomy. CONTRAST:  44m OMNIPAQUE IOHEXOL 350 MG/ML SOLN COMPARISON:  None. FINDINGS: Cardiovascular: There is no evidence of large central pulmonary embolus seen in the main pulmonary artery or main portions of the left and right pulmonary arteries. However, evaluation of the lower lobe branches of both pulmonary arteries is severely limited due to respiratory motion artifact, and therefore  smaller and more peripheral pulmonary emboli cannot be excluded on the basis of this exam. Normal cardiac size. No pericardial effusion. Mediastinum/Nodes: No enlarged mediastinal, hilar, or axillary lymph nodes. Thyroid gland, trachea, and esophagus demonstrate no significant findings. Lungs/Pleura: No pneumothorax or pleural effusion is noted. 6 mm nodule is noted laterally in the right upper lobe best seen on image number 52 of series 10. Left lung is clear. Upper Abdomen: Hepatic steatosis is noted. Musculoskeletal: No chest wall abnormality. No acute or significant osseous findings. Review of the MIP images confirms the above findings. IMPRESSION: 1. There is no evidence of large central pulmonary embolus seen in the main pulmonary artery or main portions of the left and right pulmonary arteries. However, evaluation of the lower lobe branches of both pulmonary arteries is severely limited due to respiratory motion artifact, and therefore smaller and more peripheral pulmonary emboli cannot be excluded on the basis of this exam. 2. Hepatic steatosis. 3. 6 mm right upper lobe nodule is noted. Non-contrast chest CT at 6-12 months is recommended. If the nodule is stable at time of repeat CT, then future CT at 18-24 months (from today's scan) is considered optional for low-risk patients, but is  recommended for high-risk patients. This recommendation follows the consensus statement: Guidelines for Management of Incidental Pulmonary Nodules Detected on CT Images: From the Fleischner Society 2017; Radiology 2017; 284:228-243. Electronically Signed   By: Marijo Conception M.D.   On: 10/04/2020 12:35   CT FOOT RIGHT W CONTRAST  Result Date: 10/03/2020 CLINICAL DATA:  Pain and swelling of the right fifth toe after injury 1 week ago. Cellulitis. EXAM: CT OF THE LOWER RIGHT EXTREMITY WITH CONTRAST TECHNIQUE: Multidetector CT imaging of the lower right extremity was performed according to the standard protocol following intravenous contrast administration. CONTRAST:  80m OMNIPAQUE IOHEXOL 300 MG/ML  SOLN COMPARISON:  X-ray 10/02/2020 FINDINGS: Bones/Joint/Cartilage No acute fracture. No dislocation. No bony erosion. No periosteal elevation. Joint spaces are maintained without significant arthropathy. Ligaments Suboptimally assessed by CT. Muscles and Tendons Musculotendinous structures are within normal limits by CT. No appreciable tenosynovial fluid collections. Soft tissues Soft tissue thickening along the plantar and lateral aspects of the fifth metatarsal head (series 10, image 65). No organized or rim enhancing fluid collection. No soft tissue ulceration is seen. No soft tissue gas. IMPRESSION: 1. Soft tissue thickening along the plantar and lateral aspects of the fifth metatarsal head which is nonspecific but could represent cellulitis. No organized or rim enhancing fluid collection. No soft tissue gas. 2. No acute osseous abnormality. Specifically, no evidence of osteomyelitis by CT. If high clinical suspicion persists, MRI could be performed to increase sensitivity. Electronically Signed   By: NDavina PokeD.O.   On: 10/03/2020 16:00   DG Foot Complete Right  Result Date: 10/02/2020 CLINICAL DATA:  Right foot swelling and pain after trauma this week. EXAM: RIGHT FOOT COMPLETE - 3+ VIEW  COMPARISON:  None. FINDINGS: Soft tissue swelling to the forefoot is seen on the lateral view. No acute fracture or subluxation. No opaque foreign body or soft tissue gas. Small Achilles enthesophyte. IMPRESSION: Soft tissue swelling without fracture. Electronically Signed   By: JMonte FantasiaM.D.   On: 10/02/2020 11:10   VAS [place] WITH/WO TBI  Result Date: 10/03/2020 LOWER EXTREMITY DOPPLER STUDY Indications: Ulceration RT 5th toe, s/p injury. High Risk Factors: Hypertension, hyperlipidemia, Diabetes, current smoker.  Comparison Study: No prior studies. Performing Technologist: HDarlin CocoRDMS, RVT  Examination Guidelines: Aubreigh Fuerte  complete evaluation includes at minimum, Doppler waveform signals and systolic blood pressure reading at the level of bilateral brachial, anterior tibial, and posterior tibial arteries, when vessel segments are accessible. Bilateral testing is considered an integral part of Trini Soldo complete examination. Photoelectric Plethysmograph (PPG) waveforms and toe systolic pressure readings are included as required and additional duplex testing as needed. Limited examinations for reoccurring indications may be performed as noted.  ABI Findings: +---------+------------------+-----+---------+--------+ Right    Rt Pressure (mmHg)IndexWaveform Comment  +---------+------------------+-----+---------+--------+ Brachial 157                    triphasic         +---------+------------------+-----+---------+--------+ PTA      187               1.19 triphasic         +---------+------------------+-----+---------+--------+ DP       182               1.16 triphasic         +---------+------------------+-----+---------+--------+ Great Toe140               0.89 Normal            +---------+------------------+-----+---------+--------+ +---------+------------------+-----+---------+---------------------------------+ Left     Lt Pressure (mmHg)IndexWaveform Comment                            +---------+------------------+-----+---------+---------------------------------+ Brachial                        triphasicUnable to obtain pressure due to                                           IV/bandaging                      +---------+------------------+-----+---------+---------------------------------+ PTA      195               1.24 triphasic                                  +---------+------------------+-----+---------+---------------------------------+ DP       193               1.23 triphasic                                  +---------+------------------+-----+---------+---------------------------------+ Great Toe144               0.92 Normal                                     +---------+------------------+-----+---------+---------------------------------+ +-------+-----------+-----------+------------+------------+ ABI/TBIToday's ABIToday's TBIPrevious ABIPrevious TBI +-------+-----------+-----------+------------+------------+ Right  1.19       0.89                                +-------+-----------+-----------+------------+------------+ Left   1.24       0.92                                +-------+-----------+-----------+------------+------------+  Summary: Right: Resting right ankle-brachial index is within normal range. No evidence of significant right lower extremity arterial disease. The right toe-brachial index is normal. Left: Resting left ankle-brachial index is within normal range. No evidence of significant left lower extremity arterial disease. The left toe-brachial index is normal.  *See table(s) above for measurements and observations.  Electronically signed by Monica Martinez MD on 10/03/2020 at 1:03:53 PM.    Final    VAS [place] LOWER EXTREMITY VENOUS (DVT)  Result Date: 10/04/2020  Lower Venous DVT Study Indications: Elevated D-dimer in Tyreesha Maharaj Covid+ patient.  Comparison Study: No previous exams Performing Technologist: Rogelia Rohrer   Examination Guidelines: Vantasia Pinkney complete evaluation includes B-mode imaging, spectral Doppler, color Doppler, and power Doppler as needed of all accessible portions of each vessel. Bilateral testing is considered an integral part of Pricsilla Lindvall complete examination. Limited examinations for reoccurring indications may be performed as noted. The reflux portion of the exam is performed with the patient in reverse Trendelenburg.  +---------+---------------+---------+-----------+----------+--------------+ RIGHT    CompressibilityPhasicitySpontaneityPropertiesThrombus Aging +---------+---------------+---------+-----------+----------+--------------+ CFV      Full           Yes      Yes                                 +---------+---------------+---------+-----------+----------+--------------+ SFJ      Full                                                        +---------+---------------+---------+-----------+----------+--------------+ FV Prox  Full           Yes      Yes                                 +---------+---------------+---------+-----------+----------+--------------+ FV Mid   Full           Yes      Yes                                 +---------+---------------+---------+-----------+----------+--------------+ FV DistalFull           Yes      Yes                                 +---------+---------------+---------+-----------+----------+--------------+ PFV      Full                                                        +---------+---------------+---------+-----------+----------+--------------+ POP      Full           Yes      Yes                                 +---------+---------------+---------+-----------+----------+--------------+ PTV      Full                                                        +---------+---------------+---------+-----------+----------+--------------+  PERO     Full                                                         +---------+---------------+---------+-----------+----------+--------------+   +---------+---------------+---------+-----------+----------+--------------+ LEFT     CompressibilityPhasicitySpontaneityPropertiesThrombus Aging +---------+---------------+---------+-----------+----------+--------------+ CFV      Full           Yes      Yes                                 +---------+---------------+---------+-----------+----------+--------------+ SFJ      Full                                                        +---------+---------------+---------+-----------+----------+--------------+ FV Prox  Full           Yes      Yes                                 +---------+---------------+---------+-----------+----------+--------------+ FV Mid   Full           Yes      Yes                                 +---------+---------------+---------+-----------+----------+--------------+ FV DistalFull           Yes      Yes                                 +---------+---------------+---------+-----------+----------+--------------+ PFV      Full                                                        +---------+---------------+---------+-----------+----------+--------------+ POP      Full           Yes      Yes                                 +---------+---------------+---------+-----------+----------+--------------+ PTV      Full                                                        +---------+---------------+---------+-----------+----------+--------------+ PERO     Full                                                        +---------+---------------+---------+-----------+----------+--------------+     *  See table(s) above for measurements and observations.    Preliminary     Microbiology: Recent Results (from the past 240 hour(s))  Resp Panel by RT-PCR (Flu Iyania Denne&B, Covid) Nasopharyngeal Swab     Status: Abnormal   Collection Time: 10/02/20 11:53 AM   Specimen:  Nasopharyngeal Swab; Nasopharyngeal(NP) swabs in vial transport medium  Result Value Ref Range Status   SARS Coronavirus 2 by RT PCR POSITIVE (Vyom Brass) NEGATIVE Final    Comment: RESULT CALLED TO, READ BACK BY AND VERIFIED WITH: [place] RN AT 4259 ON 10/02/20 BY S.VANHOORNE (NOTE) SARS-CoV-2 target nucleic acids are DETECTED.  The SARS-CoV-2 RNA is generally detectable in upper respiratory specimens during the acute phase of infection. Positive results are indicative of the presence of the identified virus, but do not rule out bacterial infection or co-infection with other pathogens not detected by the test. Clinical correlation with patient history and other diagnostic information is necessary to determine patient infection status. The expected result is Negative.  Fact Sheet for Patients: EntrepreneurPulse.com.au  Fact Sheet for Healthcare Providers: IncredibleEmployment.be  This test is not yet approved or cleared by the [place] FDA and  has been authorized for detection and/or diagnosis of SARS-CoV-2 by FDA under an Emergency Use Authorization (EUA).  This EUA will remain in effect (meaning this t est can be used) for the duration of  the COVID-19 declaration under Section 564(b)(1) of the Act, 21 U.S.C. section 360bbb-3(b)(1), unless the authorization is terminated or revoked sooner.     Influenza Melat Wrisley by PCR NEGATIVE NEGATIVE Final   Influenza B by PCR NEGATIVE NEGATIVE Final    Comment: (NOTE) The Xpert Xpress SARS-CoV-2/FLU/RSV plus assay is intended as an aid in the diagnosis of influenza from Nasopharyngeal swab specimens and should not be used as Trenita Hulme sole basis for treatment. Nasal washings and aspirates are unacceptable for Xpert Xpress SARS-CoV-2/FLU/RSV testing.  Fact Sheet for Patients: EntrepreneurPulse.com.au  Fact Sheet for Healthcare Providers: IncredibleEmployment.be  This test is not yet  approved or cleared by the [place] FDA and has been authorized for detection and/or diagnosis of SARS-CoV-2 by FDA under an Emergency Use Authorization (EUA). This EUA will remain in effect (meaning this test can be used) for the duration of the COVID-19 declaration under Section 564(b)(1) of the Act, 21 U.S.C. section 360bbb-3(b)(1), unless the authorization is terminated or revoked.  Performed at [facility], [place] [address] [place], [place] [address]   MRSA PCR Screening     Status: None   Collection Time: 10/02/20  1:33 PM   Specimen: Nasopharyngeal  Result Value Ref Range Status   MRSA by PCR NEGATIVE NEGATIVE Final    Comment:        The GeneXpert MRSA Assay (FDA approved for NASAL specimens only), is one component of Afra Tricarico comprehensive MRSA colonization surveillance program. It is not intended to diagnose MRSA infection nor to guide or monitor treatment for MRSA infections. Performed at [facility], [place] [address] [place], [place] [address]      Labs: Basic Metabolic Panel: Recent Labs  Lab 10/02/20 1332 10/02/20 1655 10/02/20 2038 10/03/20 0020 10/03/20 0415 10/04/20 0212  NA 137 135 131* 133* 133* 137  K 3.6 3.3* 3.7 3.9 3.3* 3.3*  CL 104 105 102 102 104 99  CO2 19* 18* 17* 19* 19* 28  GLUCOSE 179* 169* 307* 255* 191* 224*  BUN '10 9 8 9 7 7  ' CREATININE 1.00 0.73 0.68 0.75 0.66 0.77  CALCIUM  9.2 8.5* 8.2* 8.4* 8.5* 8.9  MG 2.3  --   --   --   --   --    Liver Function Tests: No results for input(s): AST, ALT, ALKPHOS, BILITOT, PROT, ALBUMIN in the last 168 hours. No results for input(s): LIPASE, AMYLASE in the last 168 hours. No results for input(s): AMMONIA in the last 168 hours. CBC: Recent Labs  Lab 10/02/20 1013 10/03/20 0415 10/04/20 0212  WBC 5.1 4.0 3.5*  NEUTROABS 2.6  --   --   HGB 14.7 13.8 14.3  HCT 41.6 40.1 42.1  MCV 87.6 88.5 88.6  PLT 212 177 203   Cardiac Enzymes: No results for  input(s): CKTOTAL, CKMB, CKMBINDEX, TROPONINI in the last 168 hours. BNP: BNP (last 3 results) No results for input(s): BNP in the last 8760 hours.  ProBNP (last 3 results) No results for input(s): PROBNP in the last 8760 hours.  CBG: Recent Labs  Lab 10/03/20 1137 10/03/20 1531 10/03/20 2145 10/04/20 0819 10/04/20 1354  GLUCAP 199* 315* 164* 231* 376*       Signed:  Fayrene Helper MD.  [facility] 10/04/2020, 4:09 PM

## 2020-10-07 ENCOUNTER — Encounter (HOSPITAL_COMMUNITY): Payer: Self-pay | Admitting: Family Medicine

## 2020-11-13 ENCOUNTER — Telehealth: Payer: Self-pay

## 2020-11-13 NOTE — Telephone Encounter (Signed)
Patient called asking for help with refill until he gets Adap. Advised him to keep his renewal appointment tomorrow. Patient understood.

## 2020-11-13 NOTE — Progress Notes (Deleted)
[name], adult   DOB: 12-26-1975, 45 y.o.   MRN: [id number]    After hospitalization 3/23-3/25/2022 1. Follow outpatient CBC/CMP 2. Follow blood sugars outpatient - adjust insulin regimen as needed 3. Follow covid isolation guidelines outpatient per CDC 4. Follow with podiatry/orthopedics  5. Follow hepatic steatosis outpatient  6. Follow 6 mm right upper lobe nodule (needs repeat CT scan within 6-12 months - then follow up imaging per rads) 7. 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 Patient stubbed herright fifth toe on a cabinet roughly 1 week ago.Shestepped into a tub and felt like her skin cracked a bit. Has since had progressively worsening discomfort and swelling with development of redness. Also noted some white drainage from the toe thisprior to admission.She's been admitted for DKA and R diabetic foot infection.  She improved with IV antibiotics and treatment of dka.  Imaging did not show any evidence of osteo or deep infection.  She was incidentally covid positive, treated with remdesivir x3 days.  She was stable at the time of discharge.  Discharged with 70/30 prescription of relion for cost.  See below for additional details  Hospital Course:   8. Mild-moderateDKA, likely multifactorial: Insulin noncompliance, poor diet and new infection a. Gap closed b. HbA1c> 15.5, poorly controlled c. Will transition to 70/30 given cost as barrier-> discharge with 30 units twice daily and follow up with PCP outpatient for additional  adjustment  d. Dietitian consult for education e. Diabetic coordinator consult to assist with getting insulin and supplies  2. SIRS likely secondary to purulent right footcellulitis a. SIRS criteria: Tachycardic and tachypneic b. Patient is immunocompromised due to HIV andpoorly controlled diabetes c. Discharge on doxycycline x 5 days d. ABI with normal bilateral ankle brachial indices e. 3/23 plain film of R foot without evidence of osteo f. 3/24, follow CT scan R foot with soft tissue thickening along plantar and lateral aspects of 5th metatarsal head - no evidence of osteo by CT g. Needs podiatry/ortho follow outpatient  3. Sinus Tach 1. Noted in previous clinic visit.  With D dimer, follow CT PE protocol -> negative for PE (though limited due to resp motion), prelim LE [place] negative for DVT as well 2. Resume home metoprolol at discharge  4. HIV a. Continue Biktarvy and valacyclovir  4. Hypertension a. Continue home amlodipine when tolerating p.o. b. Resume HCTZ c. Resume metoprolol at discharge  5. Pulmonary Nodule 1. Needs follow up within 6-12 months (see rad report)  6. Incidental COVID 19 positive a. S/premdesivir for 3 daysas she is immunocompromised and obese b. Incentive spirometer  c. Combivent PRN

## 2020-11-14 ENCOUNTER — Inpatient Hospital Stay: Payer: Medicaid Other | Admitting: Physician Assistant

## 2020-11-14 ENCOUNTER — Other Ambulatory Visit: Payer: Medicaid Other

## 2020-11-14 ENCOUNTER — Ambulatory Visit: Payer: Medicaid Other

## 2020-11-14 ENCOUNTER — Other Ambulatory Visit: Payer: Self-pay

## 2020-11-14 DIAGNOSIS — B20 Human immunodeficiency virus [HIV] disease: Secondary | ICD-10-CM

## 2020-11-14 DIAGNOSIS — Z113 Encounter for screening for infections with a predominantly sexual mode of transmission: Secondary | ICD-10-CM

## 2020-11-29 ENCOUNTER — Encounter: Payer: Medicaid Other | Admitting: Internal Medicine

## 2020-12-31 ENCOUNTER — Other Ambulatory Visit: Payer: Self-pay

## 2020-12-31 ENCOUNTER — Ambulatory Visit: Payer: Self-pay

## 2021-01-02 ENCOUNTER — Encounter: Payer: Self-pay | Admitting: Internal Medicine

## 2021-01-07 ENCOUNTER — Encounter: Payer: Medicaid Other | Admitting: Internal Medicine

## 2021-01-17 ENCOUNTER — Telehealth: Payer: Self-pay

## 2021-01-17 NOTE — Telephone Encounter (Signed)
CMA attempted to call patient after receiving refill request from [facility] for the following medication; Amlodipine, Biktarvy, Hydrochlorothiazide, Metoprolol, Novolog, Paroxetine, Valcyclovir. Last refills were sent to [facility]  Not able to reach patient at this time to confirm pharmacy. Call was dropped. Juanita Laster, RMA

## 2021-03-20 ENCOUNTER — Telehealth: Payer: Self-pay

## 2021-03-20 NOTE — Telephone Encounter (Signed)
Received request from [facility] to send the following to their pharmacy:  Amlodipine Biktarvy Hctz Metoprolol Novolog flexpen Paroxetine Valacyclovir   Dr. Drue Second only prescribes the [facility] and valacyclovir. Attempted to contact patient to confirm pharmacy and advise that she reach out to PCP for the remainder of medications. Call could not be completed.   RN faxed request back to [facility] asking to have them advise the patient to contact us as she is also overdue for follow up.   Will send MyChart message as well.   Sandie Ano, RN

## 2021-04-03 ENCOUNTER — Other Ambulatory Visit (HOSPITAL_COMMUNITY): Payer: Self-pay

## 2021-11-09 IMAGING — CT CT ANGIO CHEST
2 of 7 series · 17 of 46 positions shown · IV contrast (omnipaque)
Comparison: None.

CLINICAL DATA: Positive D-dimer level.

EXAM:
CT ANGIOGRAPHY CHEST WITH CONTRAST
TECHNIQUE: Multidetector CT imaging of the chest was performed using the
standard protocol during bolus administration of intravenous
contrast. Multiplanar CT image reconstructions and MIPs were
obtained to evaluate the vascular anatomy.
CONTRAST:  80mL OMNIPAQUE IOHEXOL 350 MG/ML SOLN

[Series 5: thins · axial · 0.88mm/px · z∈[+252,+502]mm · 15 of 286 slices shown]
[im 18/286  lung]
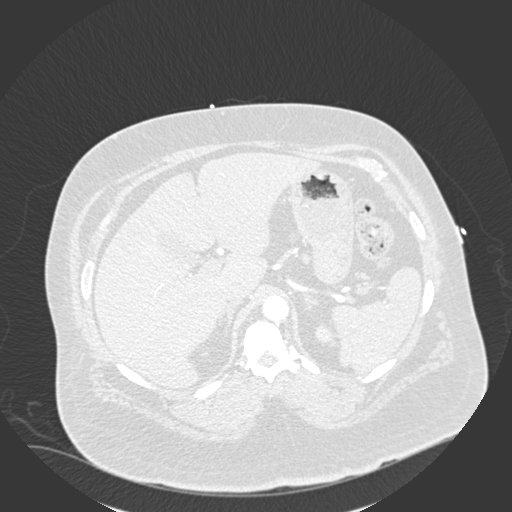
[im 36/286  soft-tissue]
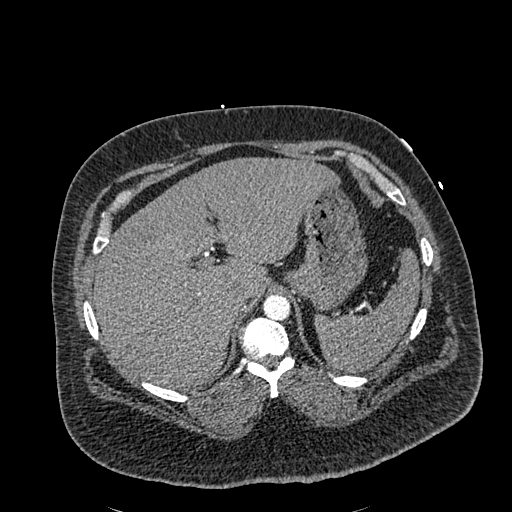
[im 54/286  lung]
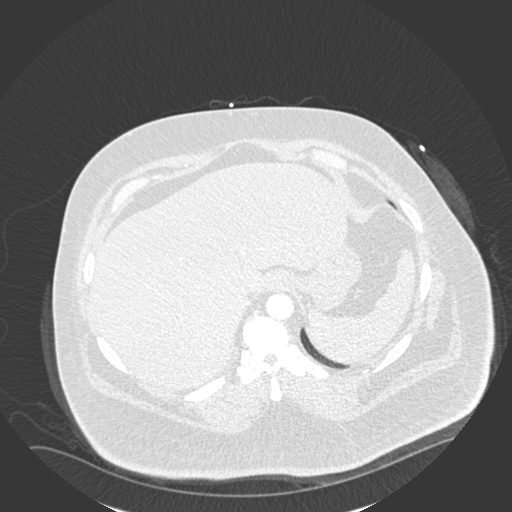
[im 72/286  soft-tissue]
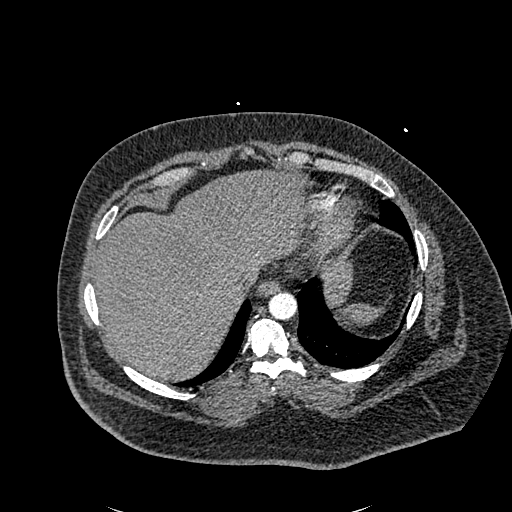
[im 90/286  lung]
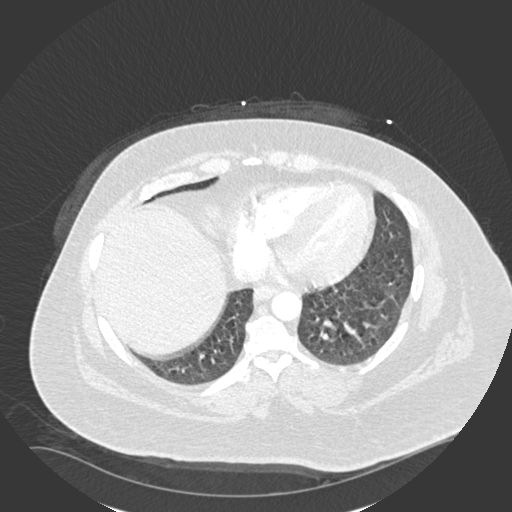
[im 107/286  soft-tissue]
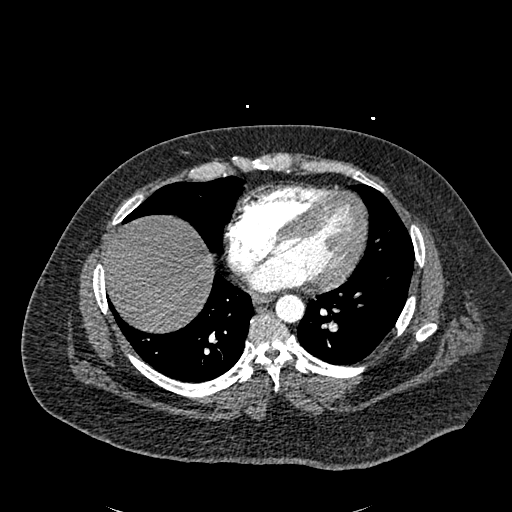
[im 125/286  lung]
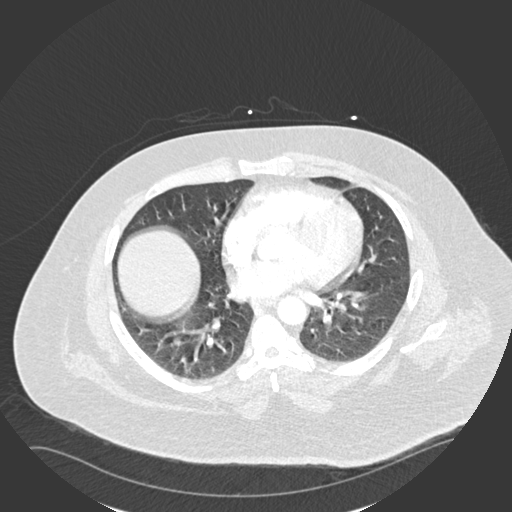
[im 143/286  soft-tissue]
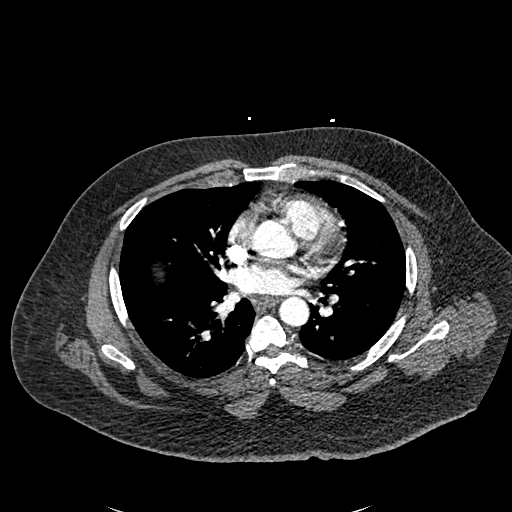
[im 161/286  lung]
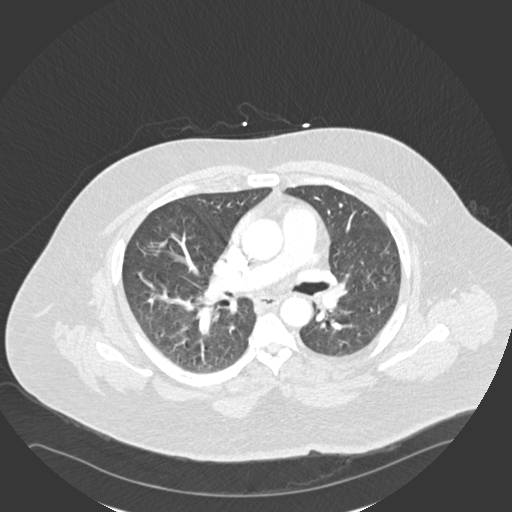
[im 179/286  soft-tissue]
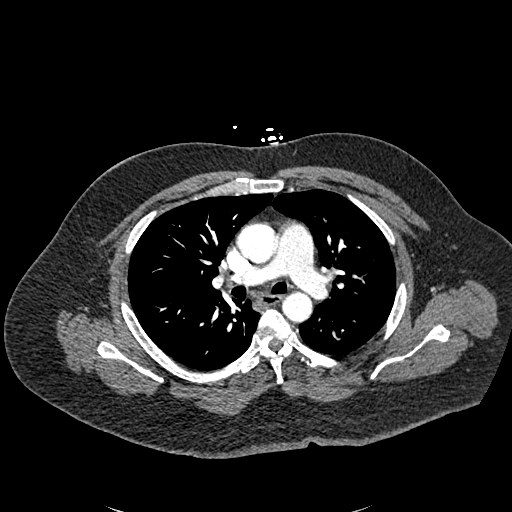
[im 196/286  lung]
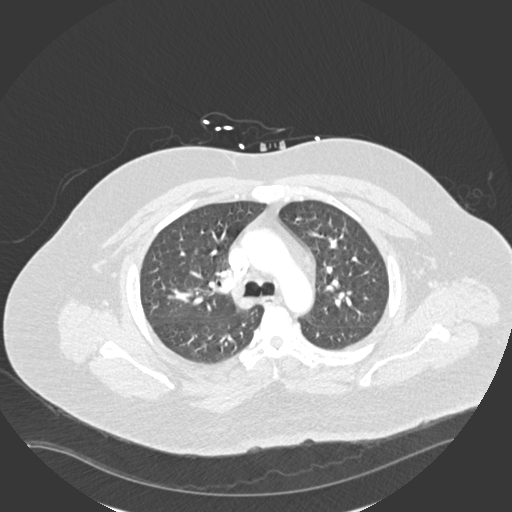
[im 214/286  soft-tissue]
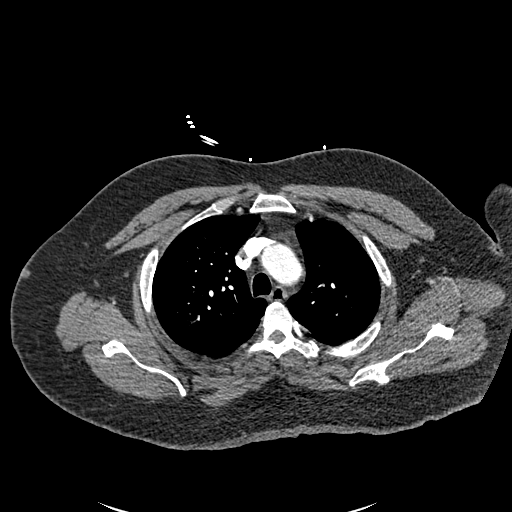
[im 232/286  lung]
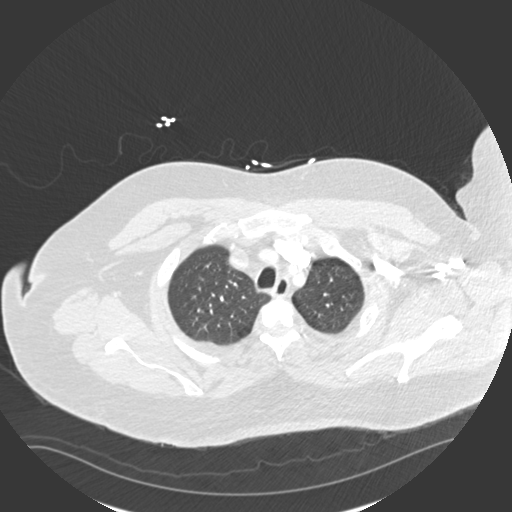
[im 250/286  soft-tissue]
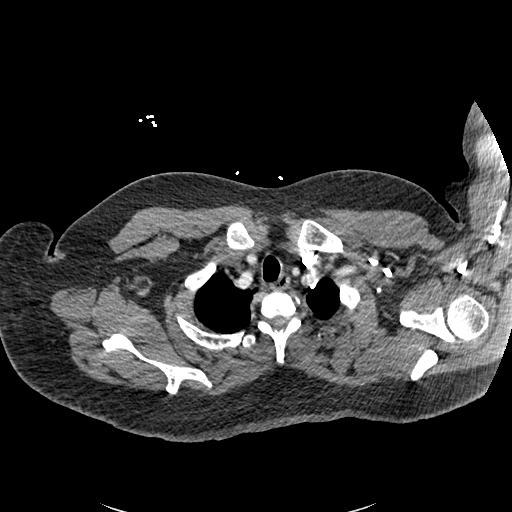
[im 268/286  lung]
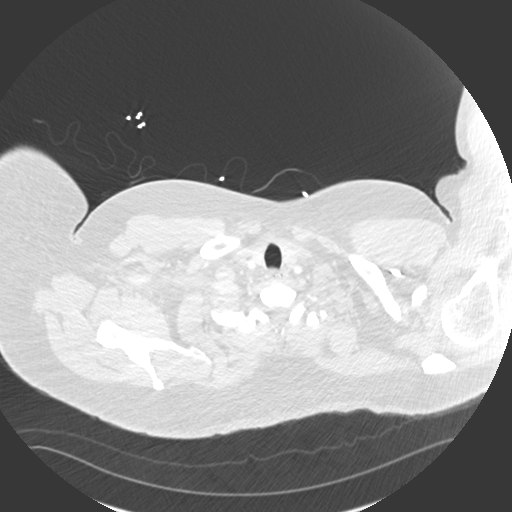

[Series 6: coronal mpr · coronal · 0.56mm/px · 2 of 106 slices shown]
[im 36/106  soft-tissue]
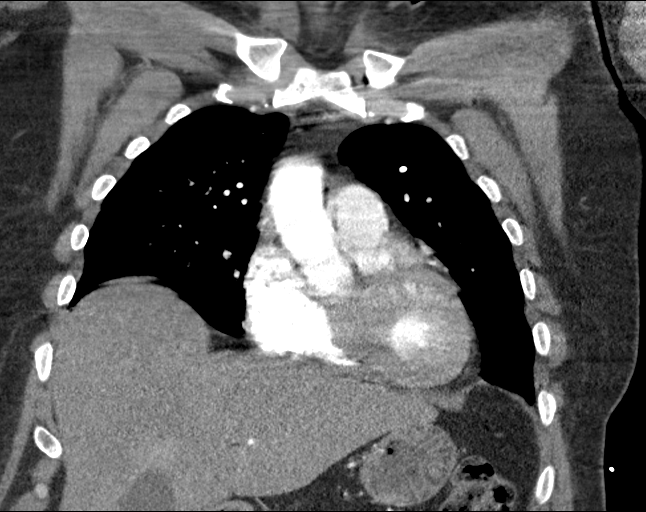
[im 71/106  soft-tissue]
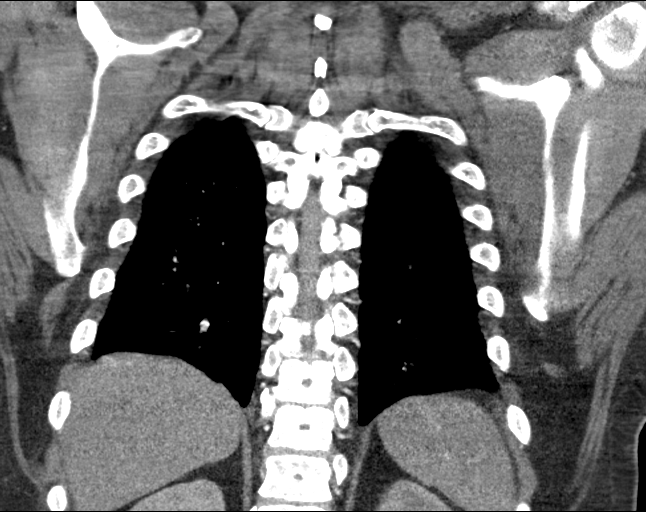

[17 of 46 positions shown; findings below may reference images not displayed]

FINDINGS: Cardiovascular: There is no evidence of large central pulmonary
embolus seen in the main pulmonary artery or main portions of the
left and right pulmonary arteries. However, evaluation of the lower
lobe branches of both pulmonary arteries is severely limited due to
respiratory motion artifact, and therefore smaller and more
peripheral pulmonary emboli cannot be excluded on the basis of this
exam. Normal cardiac size. No pericardial effusion.

Mediastinum/Nodes: No enlarged mediastinal, hilar, or axillary lymph
nodes. Thyroid gland, trachea, and esophagus demonstrate no
significant findings.

Lungs/Pleura: No pneumothorax or pleural effusion is noted. 6 mm
nodule is noted laterally in the right upper lobe best seen on image
number 52 of series 10. Left lung is clear.

Upper Abdomen: Hepatic steatosis is noted.

Musculoskeletal: No chest wall abnormality. No acute or significant
osseous findings.

Review of the MIP images confirms the above findings.
IMPRESSION: 1. There is no evidence of large central pulmonary embolus seen in
the main pulmonary artery or main portions of the left and right
pulmonary arteries. However, evaluation of the lower lobe branches
of both pulmonary arteries is severely limited due to respiratory
motion artifact, and therefore smaller and more peripheral pulmonary
emboli cannot be excluded on the basis of this exam.
2. Hepatic steatosis.
3. 6 mm right upper lobe nodule is noted. Non-contrast chest CT at
6-12 months is recommended. If the nodule is stable at time of
repeat CT, then future CT at 18-24 months (from today's scan) is
considered optional for low-risk patients, but is recommended for
high-risk patients. This recommendation follows the consensus
statement: Guidelines for Management of Incidental Pulmonary Nodules
Detected on CT Images: From the [HOSPITAL] 4611; Radiology

## 2022-01-10 DIAGNOSIS — Z880 Allergy status to penicillin: Secondary | ICD-10-CM | POA: Diagnosis not present

## 2022-01-10 DIAGNOSIS — J339 Nasal polyp, unspecified: Secondary | ICD-10-CM | POA: Diagnosis not present

## 2022-01-10 DIAGNOSIS — J330 Polyp of nasal cavity: Secondary | ICD-10-CM | POA: Diagnosis not present

## 2022-01-10 DIAGNOSIS — R69 Illness, unspecified: Secondary | ICD-10-CM | POA: Diagnosis not present

## 2022-08-02 ENCOUNTER — Other Ambulatory Visit: Payer: Self-pay

## 2022-08-02 ENCOUNTER — Encounter (HOSPITAL_COMMUNITY): Payer: Self-pay

## 2022-08-02 ENCOUNTER — Emergency Department (HOSPITAL_COMMUNITY): Payer: Medicaid Other

## 2022-08-02 ENCOUNTER — Observation Stay (HOSPITAL_COMMUNITY)
Admission: EM | Admit: 2022-08-02 | Discharge: 2022-08-04 | DRG: 975 | Disposition: A | Discharge status: Home / Self Care | Payer: Medicaid Other | Attending: Internal Medicine | Admitting: Internal Medicine

## 2022-08-02 DIAGNOSIS — B20 Human immunodeficiency virus [HIV] disease: Principal | ICD-10-CM | POA: Diagnosis present

## 2022-08-02 DIAGNOSIS — E1165 Type 2 diabetes mellitus with hyperglycemia: Secondary | ICD-10-CM | POA: Diagnosis present

## 2022-08-02 DIAGNOSIS — F640 Transsexualism: Secondary | ICD-10-CM | POA: Diagnosis present

## 2022-08-02 DIAGNOSIS — E785 Hyperlipidemia, unspecified: Secondary | ICD-10-CM | POA: Diagnosis not present

## 2022-08-02 DIAGNOSIS — J189 Pneumonia, unspecified organism: Secondary | ICD-10-CM | POA: Diagnosis present

## 2022-08-02 DIAGNOSIS — I10 Essential (primary) hypertension: Secondary | ICD-10-CM | POA: Diagnosis not present

## 2022-08-02 DIAGNOSIS — F17210 Nicotine dependence, cigarettes, uncomplicated: Secondary | ICD-10-CM | POA: Diagnosis present

## 2022-08-02 DIAGNOSIS — Z91148 Patient's other noncompliance with medication regimen for other reason: Secondary | ICD-10-CM

## 2022-08-02 DIAGNOSIS — Z720 Tobacco use: Secondary | ICD-10-CM

## 2022-08-02 DIAGNOSIS — A530 Latent syphilis, unspecified as early or late: Secondary | ICD-10-CM | POA: Diagnosis not present

## 2022-08-02 DIAGNOSIS — J181 Lobar pneumonia, unspecified organism: Secondary | ICD-10-CM | POA: Diagnosis not present

## 2022-08-02 DIAGNOSIS — Z8249 Family history of ischemic heart disease and other diseases of the circulatory system: Secondary | ICD-10-CM | POA: Diagnosis not present

## 2022-08-02 DIAGNOSIS — Z1152 Encounter for screening for COVID-19: Secondary | ICD-10-CM | POA: Diagnosis not present

## 2022-08-02 DIAGNOSIS — F649 Gender identity disorder, unspecified: Secondary | ICD-10-CM | POA: Diagnosis present

## 2022-08-02 DIAGNOSIS — N179 Acute kidney failure, unspecified: Secondary | ICD-10-CM | POA: Diagnosis not present

## 2022-08-02 LAB — COMPREHENSIVE METABOLIC PANEL
ALT: 28 U/L (ref 0–44)
AST: 31 U/L (ref 15–41)
Albumin: 3.2 g/dL — ABNORMAL LOW (ref 3.5–5.0)
Alkaline Phosphatase: 43 U/L (ref 38–126)
Anion gap: 7 (ref 5–15)
BUN: 16 mg/dL (ref 6–20)
CO2: 23 mmol/L (ref 22–32)
Calcium: 7.7 mg/dL — ABNORMAL LOW (ref 8.9–10.3)
Chloride: 101 mmol/L (ref 98–111)
Creatinine, Ser: 1.53 mg/dL — ABNORMAL HIGH (ref 0.61–1.24)
GFR, Estimated: 56 mL/min — ABNORMAL LOW (ref >60)
Glucose, Bld: 146 mg/dL — ABNORMAL HIGH (ref 70–99)
Potassium: 3.6 mmol/L (ref 3.5–5.1)
Sodium: 131 mmol/L — ABNORMAL LOW (ref 135–145)
Total Bilirubin: 0.6 mg/dL (ref 0.3–1.2)
Total Protein: 9 g/dL — ABNORMAL HIGH (ref 6.5–8.1)

## 2022-08-02 LAB — BLOOD GAS, ARTERIAL
Acid-base deficit: 1.5 mmol/L (ref 0.0–2.0)
Bicarbonate: 24.3 mmol/L (ref 20.0–28.0)
FIO2: 21 %
O2 Saturation: 54.6 %
Patient temperature: 99.3
pCO2 arterial: 44 mmHg (ref 32–48)
pH, Arterial: 7.35 (ref 7.35–7.45)
pO2, Arterial: 34 mmHg — CL (ref 83–108)

## 2022-08-02 LAB — CBC WITH DIFFERENTIAL/PLATELET
Abs Immature Granulocytes: 0.04 10*3/uL (ref 0.00–0.07)
Basophils Absolute: 0 10*3/uL (ref 0.0–0.1)
Basophils Relative: 0 %
Eosinophils Absolute: 0 10*3/uL (ref 0.0–0.5)
Eosinophils Relative: 0 %
HCT: 37.5 % — ABNORMAL LOW (ref 39.0–52.0)
Hemoglobin: 12.3 g/dL — ABNORMAL LOW (ref 13.0–17.0)
Immature Granulocytes: 1 %
Lymphocytes Relative: 31 %
Lymphs Abs: 2.1 10*3/uL (ref 0.7–4.0)
MCH: 28.5 pg (ref 26.0–34.0)
MCHC: 32.8 g/dL (ref 30.0–36.0)
MCV: 87 fL (ref 80.0–100.0)
Monocytes Absolute: 1.1 10*3/uL — ABNORMAL HIGH (ref 0.1–1.0)
Monocytes Relative: 16 %
Neutro Abs: 3.6 10*3/uL (ref 1.7–7.7)
Neutrophils Relative %: 52 %
Platelets: 142 10*3/uL — ABNORMAL LOW (ref 150–400)
RBC: 4.31 MIL/uL (ref 4.22–5.81)
RDW: 14.3 % (ref 11.5–15.5)
WBC: 6.9 10*3/uL (ref 4.0–10.5)
nRBC: 0 % (ref 0.0–0.2)

## 2022-08-02 LAB — URINALYSIS, ROUTINE W REFLEX MICROSCOPIC
Bacteria, UA: NONE SEEN
Bilirubin Urine: NEGATIVE
Glucose, UA: NEGATIVE mg/dL
Ketones, ur: NEGATIVE mg/dL
Leukocytes,Ua: NEGATIVE
Nitrite: NEGATIVE
Protein, ur: 100 mg/dL — AB
Specific Gravity, Urine: 1.034 — ABNORMAL HIGH (ref 1.005–1.030)
pH: 5 (ref 5.0–8.0)

## 2022-08-02 LAB — RESP PANEL BY RT-PCR (RSV, FLU A&B, COVID)  RVPGX2
Influenza A by PCR: NEGATIVE
Influenza B by PCR: NEGATIVE
Resp Syncytial Virus by PCR: NEGATIVE
SARS Coronavirus 2 by RT PCR: NEGATIVE

## 2022-08-02 LAB — LACTIC ACID, PLASMA: Lactic Acid, Venous: 1 mmol/L (ref 0.5–1.9)

## 2022-08-02 LAB — CBG MONITORING, ED: Glucose-Capillary: 131 mg/dL — ABNORMAL HIGH (ref 70–99)

## 2022-08-02 MED ORDER — IOHEXOL 350 MG/ML SOLN
75.0000 mL | Freq: Once | INTRAVENOUS | Status: AC | PRN
  Administered 2022-08-02: 75 mL via INTRAVENOUS

## 2022-08-02 MED ORDER — NICOTINE 21 MG/24HR TD PT24
21.0000 mg | MEDICATED_PATCH | Freq: Every day | TRANSDERMAL | Status: DC
  Administered 2022-08-03 – 2022-08-04 (×2): 21 mg via TRANSDERMAL
  Filled 2022-08-02 (×2): qty 1

## 2022-08-02 MED ORDER — METHYLPREDNISOLONE SODIUM SUCC 40 MG IJ SOLR
30.0000 mg | Freq: Once | INTRAMUSCULAR | Status: AC
  Administered 2022-08-02: 30 mg via INTRAVENOUS
  Filled 2022-08-02: qty 1

## 2022-08-02 MED ORDER — LABETALOL HCL 5 MG/ML IV SOLN: 10.0000 mg | Freq: Once | INTRAVENOUS | Status: DC

## 2022-08-02 MED ORDER — INSULIN ASPART 100 UNIT/ML IJ SOLN
0.0000 [IU] | Freq: Every day | INTRAMUSCULAR | Status: DC
  Administered 2022-08-03: 2 [IU] via SUBCUTANEOUS
  Filled 2022-08-02: qty 0.05

## 2022-08-02 MED ORDER — ONDANSETRON HCL 4 MG/2ML IJ SOLN
4.0000 mg | Freq: Once | INTRAMUSCULAR | Status: AC
  Administered 2022-08-02: 4 mg via INTRAVENOUS
  Filled 2022-08-02: qty 2

## 2022-08-02 MED ORDER — SODIUM CHLORIDE 0.9 % IV BOLUS
1000.0000 mL | Freq: Once | INTRAVENOUS | Status: AC
  Administered 2022-08-02: 1000 mL via INTRAVENOUS

## 2022-08-02 MED ORDER — METOPROLOL TARTRATE 5 MG/5ML IV SOLN
5.0000 mg | Freq: Once | INTRAVENOUS | Status: AC
  Administered 2022-08-02: 5 mg via INTRAVENOUS
  Filled 2022-08-02: qty 5

## 2022-08-02 MED ORDER — SODIUM CHLORIDE 0.9 % IV SOLN
1.0000 g | Freq: Once | INTRAVENOUS | Status: AC
  Administered 2022-08-02: 1 g via INTRAVENOUS
  Filled 2022-08-02: qty 10

## 2022-08-02 MED ORDER — METOPROLOL TARTRATE 25 MG PO TABS
12.5000 mg | ORAL_TABLET | Freq: Two times a day (BID) | ORAL | Status: DC
  Administered 2022-08-03 – 2022-08-04 (×3): 12.5 mg via ORAL
  Filled 2022-08-02 (×3): qty 1

## 2022-08-02 MED ORDER — LACTATED RINGERS IV SOLN: INTRAVENOUS | Status: AC

## 2022-08-02 MED ORDER — SODIUM CHLORIDE 0.9 % IV SOLN
500.0000 mg | Freq: Once | INTRAVENOUS | Status: AC
  Administered 2022-08-02: 500 mg via INTRAVENOUS
  Filled 2022-08-02: qty 5

## 2022-08-02 MED ORDER — SULFAMETHOXAZOLE-TRIMETHOPRIM 800-160 MG PO TABS
1.0000 | ORAL_TABLET | Freq: Once | ORAL | Status: AC
  Administered 2022-08-02: 1 via ORAL
  Filled 2022-08-02: qty 1

## 2022-08-02 MED ORDER — HEPARIN SODIUM (PORCINE) 5000 UNIT/ML IJ SOLN
5000.0000 [IU] | Freq: Three times a day (TID) | INTRAMUSCULAR | Status: DC
  Administered 2022-08-02 – 2022-08-04 (×6): 5000 [IU] via SUBCUTANEOUS
  Filled 2022-08-02 (×6): qty 1

## 2022-08-02 MED ORDER — ACETAMINOPHEN 650 MG RE SUPP: 650.0000 mg | Freq: Four times a day (QID) | RECTAL | Status: DC | PRN

## 2022-08-02 MED ORDER — SODIUM CHLORIDE 0.9 % IV SOLN
2.0000 g | INTRAVENOUS | Status: DC
  Administered 2022-08-03: 2 g via INTRAVENOUS
  Filled 2022-08-02: qty 20

## 2022-08-02 MED ORDER — ONDANSETRON HCL 4 MG PO TABS: 4.0000 mg | ORAL_TABLET | Freq: Four times a day (QID) | ORAL | Status: DC | PRN

## 2022-08-02 MED ORDER — ONDANSETRON HCL 4 MG/2ML IJ SOLN: 4.0000 mg | Freq: Four times a day (QID) | INTRAMUSCULAR | Status: DC | PRN

## 2022-08-02 MED ORDER — ACETAMINOPHEN 500 MG PO TABS
1000.0000 mg | ORAL_TABLET | Freq: Once | ORAL | Status: AC
  Administered 2022-08-02: 1000 mg via ORAL
  Filled 2022-08-02: qty 2

## 2022-08-02 MED ORDER — MELATONIN 5 MG PO TABS: 10.0000 mg | ORAL_TABLET | Freq: Every evening | ORAL | Status: DC | PRN

## 2022-08-02 MED ORDER — ACETAMINOPHEN 325 MG PO TABS: 650.0000 mg | ORAL_TABLET | Freq: Four times a day (QID) | ORAL | Status: DC | PRN

## 2022-08-02 MED ORDER — AZITHROMYCIN 250 MG PO TABS
500.0000 mg | ORAL_TABLET | Freq: Every day | ORAL | Status: DC
  Administered 2022-08-03 – 2022-08-04 (×2): 500 mg via ORAL
  Filled 2022-08-02 (×2): qty 2

## 2022-08-02 MED ORDER — IPRATROPIUM-ALBUTEROL 0.5-2.5 (3) MG/3ML IN SOLN: 3.0000 mL | Freq: Four times a day (QID) | RESPIRATORY_TRACT | Status: DC | PRN

## 2022-08-02 MED ORDER — AZITHROMYCIN 250 MG PO TABS: 500.0000 mg | ORAL_TABLET | Freq: Every day | ORAL | Status: DC

## 2022-08-02 MED ORDER — INSULIN ASPART 100 UNIT/ML IJ SOLN
0.0000 [IU] | Freq: Three times a day (TID) | INTRAMUSCULAR | Status: DC
  Administered 2022-08-03: 3 [IU] via SUBCUTANEOUS
  Filled 2022-08-02: qty 0.15

## 2022-08-02 NOTE — Assessment & Plan Note (Signed)
Not taking any meds at all at home.

## 2022-08-02 NOTE — ED Notes (Signed)
Ask pt. To provide a UA sample again

## 2022-08-02 NOTE — Assessment & Plan Note (Signed)
Start IVF. Monitor Scr during therapy with bactrim. Avoid nsaid, nephrotoxic agents.

## 2022-08-02 NOTE — Assessment & Plan Note (Signed)
Advised to stop smoking. Prn nicotine patch.

## 2022-08-02 NOTE — ED Provider Notes (Signed)
[place] EMERGENCY DEPARTMENT AT [facility] Provider Note   CSN: [id number] Arrival date & time: 08/02/22  1631     History  Chief Complaint  Patient presents with   URI    Jeremiah Stevens is a 47 y.o. adult with a past medical history significant for HIV not currently on any medications, hypertension, hyperlipidemia, and diabetes who presents to the ED due to decreased p.o. intake, chills, myalgias, and nausea for the past few days.  No sick contacts.  Admits to some diffuse abdominal pain and nausea.  No vomiting or diarrhea.  Admits to fever.  Also endorses shortness of breath.  No cough.  Denies chest pain.  No lower extremity edema.  Patient states she has been off her HIV medications for a few months after an ankle fracture because she believed her medications caused her bones to become brittle.  History obtained from patient and past medical records. No interpreter used during encounter.       Home Medications Prior to Admission medications   Medication Sig Start Date End Date Taking? Authorizing Provider  amLODipine (NORVASC) 10 MG tablet Take 1 tablet (10 mg total) by mouth daily. 10/04/20 02/01/21  Elodia Florence., MD  bictegravir-emtricitabine-tenofovir AF (BIKTARVY) 50-200-25 MG TABS tablet Take 1 tablet by mouth daily. 01/08/20   Carlyle Basques, MD  blood glucose meter kit and supplies KIT Dispense based on patient and insurance preference. Use up to four times daily as directed. 10/04/20   Elodia Florence., MD  hydrochlorothiazide (HYDRODIURIL) 25 MG tablet Take 1 tablet (25 mg total) by mouth daily. 10/04/20 11/03/20  Elodia Florence., MD  insulin aspart protamine - aspart (NOVOLOG 70/30 FLEXPEN RELION) (70-30) 100 UNIT/ML FlexPen Inject 0.3 mLs (30 Units total) into the skin 2 (two) times daily. 10/04/20   Elodia Florence., MD  Insulin Pen Needle 32G X 4 MM MISC 1 Device by Does not apply route QID. For use with insulin pens 02/12/20   Donne Hazel, MD  metoprolol tartrate (LOPRESSOR) 25 MG tablet Take 1 tablet (25 mg total) by mouth 2 (two) times daily. 10/04/20 01/02/21  Elodia Florence., MD  PARoxetine (PAXIL) 10 MG tablet Take 1 tablet (10 mg total) by mouth daily. 10/04/20 11/03/20  Elodia Florence., MD  polyethylene glycol (MIRALAX / GLYCOLAX) 17 g packet Take 17 g by mouth daily as needed for moderate constipation.    [provider]  valACYclovir (VALTREX) 500 MG tablet TAKE 1 TABLET(500 MG) BY MOUTH TWICE DAILY Patient taking differently: Take 500 mg by mouth 2 (two) times daily. 08/08/20   Carlyle Basques, MD      Allergies    Peanut-containing drug products and Sustiva [efavirenz]    Review of Systems   Review of Systems  Constitutional:  Positive for chills and fever.  Respiratory:  Positive for shortness of breath. Negative for cough.   Cardiovascular:  Negative for chest pain and leg swelling.  Gastrointestinal:  Positive for nausea. Negative for diarrhea and vomiting.  Musculoskeletal:  Positive for myalgias.  All other systems reviewed and are negative.   Physical Exam Updated Vital Signs BP 116/78   Pulse (!) 112   Temp (!) 103 F (39.4 C) (Oral)   Resp (!) 32   Ht 6' (1.829 m)   Wt 118 kg   SpO2 97%   BMI 35.28 kg/m  Physical Exam Vitals and nursing note reviewed.  Constitutional:  General: She is not in acute distress.    Appearance: She is not ill-appearing.  HENT:     Head: Normocephalic.  Eyes:     Pupils: Pupils are equal, round, and reactive to light.  Cardiovascular:     Rate and Rhythm: Normal rate and regular rhythm.     Pulses: Normal pulses.     Heart sounds: Normal heart sounds. No murmur heard.    No friction rub. No gallop.  Pulmonary:     Effort: Pulmonary effort is normal.     Breath sounds: Normal breath sounds.  Abdominal:     General: Abdomen is flat. There is no distension.     Palpations: Abdomen is soft.     Tenderness: There is no  abdominal tenderness. There is no guarding or rebound.  Musculoskeletal:        General: Normal range of motion.     Cervical back: Neck supple.  Skin:    General: Skin is warm and dry.  Neurological:     General: No focal deficit present.     Mental Status: She is alert.  Psychiatric:        Mood and Affect: Mood normal.        Behavior: Behavior normal.     ED Results / Procedures / Treatments   Labs (all labs ordered are listed, but only abnormal results are displayed) Labs Reviewed  CBC WITH DIFFERENTIAL/PLATELET - Abnormal; Notable for the following components:      Result Value   Hemoglobin 12.3 (*)    HCT 37.5 (*)    Platelets 142 (*)    Monocytes Absolute 1.1 (*)    All other components within normal limits  COMPREHENSIVE METABOLIC PANEL - Abnormal; Notable for the following components:   Sodium 131 (*)    Glucose, Bld 146 (*)    Creatinine, Ser 1.53 (*)    Calcium 7.7 (*)    Total Protein 9.0 (*)    Albumin 3.2 (*)    GFR, Estimated 56 (*)    All other components within normal limits  RESP PANEL BY RT-PCR (RSV, FLU A&B, COVID)  RVPGX2  CULTURE, BLOOD (ROUTINE X 2)  CULTURE, BLOOD (ROUTINE X 2)  LACTIC ACID, PLASMA  URINALYSIS, ROUTINE W REFLEX MICROSCOPIC  BLOOD GAS, ARTERIAL  T-HELPER CELLS (CD4) COUNT (NOT AT [facility])  HIV-1 RNA QUANT-NO REFLEX-BLD    EKG EKG Interpretation  Date/Time:  Sunday August 02 2022 17:19:38 EST Ventricular Rate:  133 PR Interval:  124 QRS Duration: 78 QT Interval:  273 QTC Calculation: 406 R Axis:   90 Text Interpretation: Sinus tachycardia Ventricular trigeminy Aberrant complex Probable left atrial enlargement Borderline right axis deviation Nonspecific repol abnormality, inferior leads Borderline ST elevation, anterolateral leads No significant change since last tracing Confirmed by Richardean Canal [phone]) on 08/02/2022 5:58:20 PM  Radiology CT Angio Chest PE W and/or Wo Contrast  Result Date: 08/02/2022 CLINICAL DATA:   Cough, acute nonlocalized abdominal pain, malaise. Pulmonary embolism, high probability. EXAM: CT ANGIOGRAPHY CHEST CT ABDOMEN AND PELVIS WITH CONTRAST TECHNIQUE: Multidetector CT imaging of the chest was performed using the standard protocol during bolus administration of intravenous contrast. Multiplanar CT image reconstructions and MIPs were obtained to evaluate the vascular anatomy. Multidetector CT imaging of the abdomen and pelvis was performed using the standard protocol during bolus administration of intravenous contrast. RADIATION DOSE REDUCTION: This exam was performed according to the departmental dose-optimization program which includes automated exposure control, adjustment of the mA and/or  kV according to patient size and/or use of iterative reconstruction technique. CONTRAST:  56mL OMNIPAQUE IOHEXOL 350 MG/ML SOLN COMPARISON:  None Available. FINDINGS: CTA CHEST FINDINGS Cardiovascular: Imaging, particularly within the lung bases, is limited by respiratory motion artifact. There is adequate opacification of the pulmonary arterial tree. No intraluminal filling defect identified to suggest acute pulmonary embolism through the segmental level. The central pulmonary arteries are of normal caliber. Mild coronary artery calcification. Mild global cardiomegaly. No pericardial effusion. The thoracic aorta is unremarkable. Mediastinum/Nodes: Shotty right hilar and subcarinal adenopathy may be reactive in nature. No frankly pathologic thoracic adenopathy. Visualized thyroid is unremarkable. Esophagus is unremarkable. Lungs/Pleura: There is dense consolidation within the posterior basal right lower lobe in keeping with changes of acute lobar pneumonia in the appropriate clinical setting. No pneumothorax or pleural effusion. Central airways are widely patent. Musculoskeletal: No acute bone abnormality. No lytic or blastic bone lesion. Review of the MIP images confirms the above findings. CT ABDOMEN and PELVIS  FINDINGS Hepatobiliary: No focal liver abnormality is seen. No gallstones, gallbladder wall thickening, or biliary dilatation. Pancreas: Unremarkable. No pancreatic ductal dilatation or surrounding inflammatory changes. Spleen: Normal in size without focal abnormality. Adrenals/Urinary Tract: Adrenal glands are unremarkable. Kidneys are normal, without renal calculi, focal lesion, or hydronephrosis. Bladder is unremarkable. Stomach/Bowel: Mild sigmoid diverticulosis without superimposed acute inflammatory change. Stomach, small bowel, and large bowel are otherwise unremarkable. No evidence of obstruction or focal inflammation. Appendix normal. No free intraperitoneal gas or fluid. Vascular/Lymphatic: No significant vascular findings are present. No enlarged abdominal or pelvic lymph nodes. Reproductive: Prostate is unremarkable. Other: No abdominal wall hernia Musculoskeletal: No acute bone abnormality. No lytic or blastic bone lesion. Review of the MIP images confirms the above findings. IMPRESSION: 1. No pulmonary embolism. 2. Dense consolidation within the posterior basal right lower lobe in keeping with acute lobar pneumonia in the appropriate clinical setting. 3. Mild coronary artery calcification. Mild global cardiomegaly. 4. No acute intra-abdominal pathology identified. 5. Mild sigmoid diverticulosis without superimposed acute inflammatory change. Electronically Signed   By: Helyn Numbers M.D.   On: 08/02/2022 19:21   CT ABDOMEN PELVIS W CONTRAST  Result Date: 08/02/2022 CLINICAL DATA:  Cough, acute nonlocalized abdominal pain, malaise. Pulmonary embolism, high probability. EXAM: CT ANGIOGRAPHY CHEST CT ABDOMEN AND PELVIS WITH CONTRAST TECHNIQUE: Multidetector CT imaging of the chest was performed using the standard protocol during bolus administration of intravenous contrast. Multiplanar CT image reconstructions and MIPs were obtained to evaluate the vascular anatomy. Multidetector CT imaging of the  abdomen and pelvis was performed using the standard protocol during bolus administration of intravenous contrast. RADIATION DOSE REDUCTION: This exam was performed according to the departmental dose-optimization program which includes automated exposure control, adjustment of the mA and/or kV according to patient size and/or use of iterative reconstruction technique. CONTRAST:  40mL OMNIPAQUE IOHEXOL 350 MG/ML SOLN COMPARISON:  None Available. FINDINGS: CTA CHEST FINDINGS Cardiovascular: Imaging, particularly within the lung bases, is limited by respiratory motion artifact. There is adequate opacification of the pulmonary arterial tree. No intraluminal filling defect identified to suggest acute pulmonary embolism through the segmental level. The central pulmonary arteries are of normal caliber. Mild coronary artery calcification. Mild global cardiomegaly. No pericardial effusion. The thoracic aorta is unremarkable. Mediastinum/Nodes: Shotty right hilar and subcarinal adenopathy may be reactive in nature. No frankly pathologic thoracic adenopathy. Visualized thyroid is unremarkable. Esophagus is unremarkable. Lungs/Pleura: There is dense consolidation within the posterior basal right lower lobe in keeping with changes of acute lobar  pneumonia in the appropriate clinical setting. No pneumothorax or pleural effusion. Central airways are widely patent. Musculoskeletal: No acute bone abnormality. No lytic or blastic bone lesion. Review of the MIP images confirms the above findings. CT ABDOMEN and PELVIS FINDINGS Hepatobiliary: No focal liver abnormality is seen. No gallstones, gallbladder wall thickening, or biliary dilatation. Pancreas: Unremarkable. No pancreatic ductal dilatation or surrounding inflammatory changes. Spleen: Normal in size without focal abnormality. Adrenals/Urinary Tract: Adrenal glands are unremarkable. Kidneys are normal, without renal calculi, focal lesion, or hydronephrosis. Bladder is  unremarkable. Stomach/Bowel: Mild sigmoid diverticulosis without superimposed acute inflammatory change. Stomach, small bowel, and large bowel are otherwise unremarkable. No evidence of obstruction or focal inflammation. Appendix normal. No free intraperitoneal gas or fluid. Vascular/Lymphatic: No significant vascular findings are present. No enlarged abdominal or pelvic lymph nodes. Reproductive: Prostate is unremarkable. Other: No abdominal wall hernia Musculoskeletal: No acute bone abnormality. No lytic or blastic bone lesion. Review of the MIP images confirms the above findings. IMPRESSION: 1. No pulmonary embolism. 2. Dense consolidation within the posterior basal right lower lobe in keeping with acute lobar pneumonia in the appropriate clinical setting. 3. Mild coronary artery calcification. Mild global cardiomegaly. 4. No acute intra-abdominal pathology identified. 5. Mild sigmoid diverticulosis without superimposed acute inflammatory change. Electronically Signed   By: Helyn Numbers M.D.   On: 08/02/2022 19:21   DG Chest Portable 1 View  Result Date: 08/02/2022 CLINICAL DATA:  Shortness of breath. EXAM: PORTABLE CHEST 1 VIEW COMPARISON:  01/26/2016 FINDINGS: The heart size and mediastinal contours are within normal limits. Both lungs are clear. The visualized skeletal structures are unremarkable. IMPRESSION: No active disease. Electronically Signed   By: Elige Ko M.D.   On: 08/02/2022 17:21    Procedures .Critical Care  Performed by: Mannie Stabile, PA-C Authorized by: Mannie Stabile, PA-C   Critical care provider statement:    Critical care time (minutes):  32   Critical care was necessary to treat or prevent imminent or life-threatening deterioration of the following conditions:  Respiratory failure   Critical care was time spent personally by me on the following activities:  Development of treatment plan with patient or surrogate, discussions with consultants, evaluation of  patient's response to treatment, examination of patient, ordering and review of laboratory studies, ordering and review of radiographic studies, ordering and performing treatments and interventions, pulse oximetry, re-evaluation of patient's condition and review of old charts   I assumed direction of critical care for this patient from another provider in my specialty: no     Care discussed with: admitting provider       Medications Ordered in ED Medications  azithromycin (ZITHROMAX) 500 mg in sodium chloride 0.9 % 250 mL IVPB (500 mg Intravenous New Bag/Given 08/02/22 2025)  acetaminophen (TYLENOL) tablet 1,000 mg (1,000 mg Oral Given 08/02/22 1734)  sodium chloride 0.9 % bolus 1,000 mL (0 mLs Intravenous Stopped 08/02/22 1947)  ondansetron (ZOFRAN) injection 4 mg (4 mg Intravenous Given 08/02/22 1733)  iohexol (OMNIPAQUE) 350 MG/ML injection 75 mL (75 mLs Intravenous Contrast Given 08/02/22 1826)  cefTRIAXone (ROCEPHIN) 1 g in sodium chloride 0.9 % 100 mL IVPB (0 g Intravenous Stopped 08/02/22 2025)  sodium chloride 0.9 % bolus 1,000 mL (0 mLs Intravenous Stopped 08/02/22 2025)    ED Course/ Medical Decision Making/ A&P Clinical Course as of 08/02/22 2107  Wynelle Link Aug 02, 2022  2018 Reassessed patient at bedside, patient sleeping. Patient desating while sleeping.  placed on patient.  [CA]  2106  pO2, Arterial(!!): 34 [CA]    Clinical Course User Index [CA] Suzy Bouchard, PA-C                             Medical Decision Making Amount and/or Complexity of Data Reviewed External Data Reviewed: notes. Labs: ordered. Decision-making details documented in ED Course. Radiology: ordered and independent interpretation performed. Decision-making details documented in ED Course. ECG/medicine tests: ordered and independent interpretation performed. Decision-making details documented in ED Course.  Risk OTC drugs. Prescription drug management. Decision regarding hospitalization.   This  patient presents to the ED for concern of myalgias, malaise, nausea, this involves an extensive number of treatment options, and is a complaint that carries with it a high risk of complications and morbidity.  The differential diagnosis includes viral process, pneumonia, oppurtunistic infection, sepsis, etc  47 year old male (male to male transgender) presents to the ED due to decreased p.o. intake, chills, myalgias, and nausea for the past few days.  History of HIV not currently on any medications for the past few months.  No sick contacts.  Upon arrival, patient febrile at 103 F and tachycardic at 145.  Reassuring physical exam.  Lungs clear to auscultation bilaterally.  Abdomen soft, nondistended, nontender.  Screening sepsis labs ordered.  Possible viral etiology however, given patient has HIV and not currently on any medications it increases patient risks for atypical infection. IV fluids and Zofran given.  Tylenol for fever.  CBC reassuring.  No leukocytosis.  Mild edema with hemoglobin 12.3.  Lactic acid normal.  CMP significant for elevated creatinine 1.53, hyperglycemia 146, and hyponatremia 131.  COVID/influenza negative.  Chest x-ray personally reviewed and interpreted as negative or signs of pneumonia, pneumothorax or widened mediastinum.  EKG demonstrates sinus tachycardia.  No major changes from previous EKG.  Reviewed cardiac monitoring patient remains tachycardic.  CTA chest ordered to rule out evidence of PE or pulmonary infection.  CT abdomen added as well to rule out evidence of infection.  CTA chest and CT abdomen reviewed which demonstrates acute lobular pneumonia.  Also demonstrates diverticulosis without evidence of diverticulitis.  No other acute abnormalities.  Patient started on IV antibiotics for pneumonia.  ABG added to rule out evidence of PCP.   pCO2 34.  Added Solu-Medrol and Bactrim for possible PCP pneumonia.  Will consult hospitalist for admission.  Discussed with  Dr. Darl Householder who agrees with assessment and plan.  9:16 PM Discussed with Dr. Bridgett Larsson with [facility] who agrees to admit patient.        Final Clinical Impression(s) / ED Diagnoses Final diagnoses:  Pneumonia of right lower lobe due to infectious organism  HIV infection, unspecified symptom status [facility])    Rx / [place] Orders ED Discharge Orders     None         Karie Kirks 08/02/22 2117    Drenda Freeze, MD 08/02/22 2219

## 2022-08-02 NOTE — H&P (Signed)
History and Physical    Jeremiah Stevens HQI:[id number] DOB: Feb 14, 1976 DOA: 08/02/2022  DOS: the patient was seen and examined on 08/02/2022  PCP: Carlyle Basques, MD   Patient coming from: Home  I have personally briefly reviewed patient's old medical records in [place]  CC: SOB HPI: 47 year old transgender male history of untreated HIV, untreated hypertension, untreated type 2 diabetes, presents to the ER today with a 4-day history of shortness of breath, malaise.  Has had chills for about 2 days.  Has had a cough but nonproductive.  No vomiting or diarrhea.  Patient has been off of HIV medications for over a year now.  She does not see a primary care physician.  She is not taking any medications at all for her diabetes, hypertension or HIV disease.  On arrival febrile to 103 Fahrenheit, heart rate 145, blood pressure 152/94.  Sats were 100% on room air.  Labs showed a white count of 6.9, hemoglobin 12.3, platelets 142  Sodium 131, BUN of 16, creatinine 1.5, total protein 9, albumin 3.2 glucose of 146  COVID-negative, influenza negative, RSV negative  UA showed a specific gravity 1.034  CTPA demonstrated dense consolidation within the right lower lobe.  Negative for pulmonary embolism.  Tried hospitalist contacted for admission.    ED Course: CTPA negative for PE. Shows RLL pneumonia  Review of Systems:  Review of Systems  Constitutional:  Positive for chills and malaise/fatigue.  HENT: Negative.    Eyes: Negative.   Respiratory:  Positive for cough, shortness of breath and wheezing.   Cardiovascular: Negative.   Gastrointestinal:  Positive for abdominal pain. Negative for vomiting.  Genitourinary: Negative.   Musculoskeletal:  Positive for myalgias.  Skin: Negative.   Neurological: Negative.   Endo/Heme/Allergies: Negative.   Psychiatric/Behavioral: Negative.    All other systems reviewed and are negative.   Past Medical History:  Diagnosis Date    Diabetes mellitus without complication (HCC)    Heartburn    occasional; OTC as needed   Herpes genitalis in men    HIV disease ([place])    Hyperlipidemia    Hypertension    under control with med., has been on med. x 1 yr.   Lateral malleolar fracture 09/02/2013   left   Migraines    Tear of deltoid ligament of left ankle 09/02/2013   Type 2 diabetes mellitus with hyperosmolar nonketotic hyperglycemia ([place]) 02/10/2020    Past Surgical History:  Procedure Laterality Date   NO PAST SURGERIES     ORIF ANKLE FRACTURE Left 09/13/2013   Procedure: OPEN REDUCTION INTERNAL FIXATION (ORIF) LEFT LATERAL MALLEOLUS ANKLE FRACTURE ;  Surgeon: Vickey Huger, MD;  Location: [facility];  Service: Orthopedics;  Laterality: Left;     reports that she has been smoking cigarettes. She has a 7.50 pack-year smoking history. She has never used smokeless tobacco. She reports current drug use. Frequency: 7.00 times per week. Drug: Marijuana. She reports that she does not drink alcohol.  Allergies  Allergen Reactions   Penicillins Anaphylaxis   Peanut-Containing Drug Products Other (See Comments)    WALNUTS - SORES ON TONGUE   Sustiva [Efavirenz] Rash    Family History  Problem Relation Age of Onset   Hypertension Mother     Pt does not take any home medications  Physical Exam: Vitals:   08/02/22 2030 08/02/22 2045 08/02/22 2100 08/02/22 2115  BP: (!) 139/108 (!) 156/109 (!) 143/100 (!) 189/110  Pulse: (!) 109 (!) 121 Marland Kitchen)  120 (!) 118  Resp: (!) 30 (!) 29 (!) 33 (!) 30  Temp:   99.6 F (37.6 C) 98.6 F (37 C)  TempSrc:    Oral  SpO2: 100% 96% 99% 98%  Weight:      Height:        Physical Exam Vitals and nursing note reviewed.  Constitutional:      General: She is not in acute distress.    Appearance: She is obese. She is not toxic-appearing or diaphoretic.  HENT:     Head: Normocephalic and atraumatic.     Nose: Nose normal.  Eyes:     General: No scleral  icterus. Cardiovascular:     Rate and Rhythm: Regular rhythm. Tachycardia present.     Pulses: Normal pulses.  Pulmonary:     Effort: Pulmonary effort is normal. No respiratory distress.     Breath sounds: No wheezing.  Abdominal:     General: Abdomen is protuberant. Bowel sounds are normal. There is no distension.     Palpations: Abdomen is soft.     Tenderness: There is no abdominal tenderness. There is no guarding or rebound.  Musculoskeletal:     Right lower leg: No edema.     Left lower leg: No edema.  Skin:    General: Skin is warm and dry.     Capillary Refill: Capillary refill takes less than 2 seconds.  Neurological:     General: No focal deficit present.     Mental Status: She is alert and oriented to person, place, and time.      Labs on Admission: I have personally reviewed following labs and imaging studies  CBC: Recent Labs  Lab 08/02/22 1719  WBC 6.9  NEUTROABS 3.6  HGB 12.3*  HCT 37.5*  MCV 87.0  PLT 142*   Basic Metabolic Panel: Recent Labs  Lab 08/02/22 1719  NA 131*  K 3.6  CL 101  CO2 23  GLUCOSE 146*  BUN 16  CREATININE 1.53*  CALCIUM 7.7*   GFR: Estimated Creatinine Clearance (by C-G formula based on SCr of 1.53 mg/dL (H)) Male: 13.0 mL/min (A) Male: 80 mL/min (A) Liver Function Tests: Recent Labs  Lab 08/02/22 1719  AST 31  ALT 28  ALKPHOS 43  BILITOT 0.6  PROT 9.0*  ALBUMIN 3.2*   No results for input(s): "LIPASE", "AMYLASE" in the last 168 hours. No results for input(s): "AMMONIA" in the last 168 hours. Coagulation Profile: No results for input(s): "INR", "PROTIME" in the last 168 hours. Cardiac Enzymes: No results for input(s): "CKTOTAL", "CKMB", "CKMBINDEX", "TROPONINI", "TROPONINIHS" in the last 168 hours. BNP (last 3 results) No results for input(s): "PROBNP" in the last 8760 hours. HbA1C: No results for input(s): "HGBA1C" in the last 72 hours. CBG: No results for input(s): "GLUCAP" in the last 168 hours. Lipid  Profile: No results for input(s): "CHOL", "HDL", "LDLCALC", "TRIG", "CHOLHDL", "LDLDIRECT" in the last 72 hours. Thyroid Function Tests: No results for input(s): "TSH", "T4TOTAL", "FREET4", "T3FREE", "THYROIDAB" in the last 72 hours. Anemia Panel: No results for input(s): "VITAMINB12", "FOLATE", "FERRITIN", "TIBC", "IRON", "RETICCTPCT" in the last 72 hours. Urine analysis:    Component Value Date/Time   COLORURINE YELLOW 08/02/2022 1942   APPEARANCEUR CLEAR 08/02/2022 1942   LABSPEC 1.034 (H) 08/02/2022 1942   PHURINE 5.0 08/02/2022 1942   GLUCOSEU NEGATIVE 08/02/2022 1942   HGBUR MODERATE (A) 08/02/2022 1942   BILIRUBINUR NEGATIVE 08/02/2022 1942   KETONESUR NEGATIVE 08/02/2022 1942   PROTEINUR 100 (A)  08/02/2022 1942   UROBILINOGEN 0.2 10/08/2011 1145   NITRITE NEGATIVE 08/02/2022 1942   LEUKOCYTESUR NEGATIVE 08/02/2022 1942    Radiological Exams on Admission: I have personally reviewed images CT Angio Chest PE W and/or Wo Contrast  Result Date: 08/02/2022 CLINICAL DATA:  Cough, acute nonlocalized abdominal pain, malaise. Pulmonary embolism, high probability. EXAM: CT ANGIOGRAPHY CHEST CT ABDOMEN AND PELVIS WITH CONTRAST TECHNIQUE: Multidetector CT imaging of the chest was performed using the standard protocol during bolus administration of intravenous contrast. Multiplanar CT image reconstructions and MIPs were obtained to evaluate the vascular anatomy. Multidetector CT imaging of the abdomen and pelvis was performed using the standard protocol during bolus administration of intravenous contrast. RADIATION DOSE REDUCTION: This exam was performed according to the departmental dose-optimization program which includes automated exposure control, adjustment of the mA and/or kV according to patient size and/or use of iterative reconstruction technique. CONTRAST:  68mL OMNIPAQUE IOHEXOL 350 MG/ML SOLN COMPARISON:  None Available. FINDINGS: CTA CHEST FINDINGS Cardiovascular: Imaging,  particularly within the lung bases, is limited by respiratory motion artifact. There is adequate opacification of the pulmonary arterial tree. No intraluminal filling defect identified to suggest acute pulmonary embolism through the segmental level. The central pulmonary arteries are of normal caliber. Mild coronary artery calcification. Mild global cardiomegaly. No pericardial effusion. The thoracic aorta is unremarkable. Mediastinum/Nodes: Shotty right hilar and subcarinal adenopathy may be reactive in nature. No frankly pathologic thoracic adenopathy. Visualized thyroid is unremarkable. Esophagus is unremarkable. Lungs/Pleura: There is dense consolidation within the posterior basal right lower lobe in keeping with changes of acute lobar pneumonia in the appropriate clinical setting. No pneumothorax or pleural effusion. Central airways are widely patent. Musculoskeletal: No acute bone abnormality. No lytic or blastic bone lesion. Review of the MIP images confirms the above findings. CT ABDOMEN and PELVIS FINDINGS Hepatobiliary: No focal liver abnormality is seen. No gallstones, gallbladder wall thickening, or biliary dilatation. Pancreas: Unremarkable. No pancreatic ductal dilatation or surrounding inflammatory changes. Spleen: Normal in size without focal abnormality. Adrenals/Urinary Tract: Adrenal glands are unremarkable. Kidneys are normal, without renal calculi, focal lesion, or hydronephrosis. Bladder is unremarkable. Stomach/Bowel: Mild sigmoid diverticulosis without superimposed acute inflammatory change. Stomach, small bowel, and large bowel are otherwise unremarkable. No evidence of obstruction or focal inflammation. Appendix normal. No free intraperitoneal gas or fluid. Vascular/Lymphatic: No significant vascular findings are present. No enlarged abdominal or pelvic lymph nodes. Reproductive: Prostate is unremarkable. Other: No abdominal wall hernia Musculoskeletal: No acute bone abnormality. No lytic  or blastic bone lesion. Review of the MIP images confirms the above findings. IMPRESSION: 1. No pulmonary embolism. 2. Dense consolidation within the posterior basal right lower lobe in keeping with acute lobar pneumonia in the appropriate clinical setting. 3. Mild coronary artery calcification. Mild global cardiomegaly. 4. No acute intra-abdominal pathology identified. 5. Mild sigmoid diverticulosis without superimposed acute inflammatory change. Electronically Signed   By: Fidela Salisbury M.D.   On: 08/02/2022 19:21   CT ABDOMEN PELVIS W CONTRAST  Result Date: 08/02/2022 CLINICAL DATA:  Cough, acute nonlocalized abdominal pain, malaise. Pulmonary embolism, high probability. EXAM: CT ANGIOGRAPHY CHEST CT ABDOMEN AND PELVIS WITH CONTRAST TECHNIQUE: Multidetector CT imaging of the chest was performed using the standard protocol during bolus administration of intravenous contrast. Multiplanar CT image reconstructions and MIPs were obtained to evaluate the vascular anatomy. Multidetector CT imaging of the abdomen and pelvis was performed using the standard protocol during bolus administration of intravenous contrast. RADIATION DOSE REDUCTION: This exam was performed according to the  departmental dose-optimization program which includes automated exposure control, adjustment of the mA and/or kV according to patient size and/or use of iterative reconstruction technique. CONTRAST:  13mL OMNIPAQUE IOHEXOL 350 MG/ML SOLN COMPARISON:  None Available. FINDINGS: CTA CHEST FINDINGS Cardiovascular: Imaging, particularly within the lung bases, is limited by respiratory motion artifact. There is adequate opacification of the pulmonary arterial tree. No intraluminal filling defect identified to suggest acute pulmonary embolism through the segmental level. The central pulmonary arteries are of normal caliber. Mild coronary artery calcification. Mild global cardiomegaly. No pericardial effusion. The thoracic aorta is unremarkable.  Mediastinum/Nodes: Shotty right hilar and subcarinal adenopathy may be reactive in nature. No frankly pathologic thoracic adenopathy. Visualized thyroid is unremarkable. Esophagus is unremarkable. Lungs/Pleura: There is dense consolidation within the posterior basal right lower lobe in keeping with changes of acute lobar pneumonia in the appropriate clinical setting. No pneumothorax or pleural effusion. Central airways are widely patent. Musculoskeletal: No acute bone abnormality. No lytic or blastic bone lesion. Review of the MIP images confirms the above findings. CT ABDOMEN and PELVIS FINDINGS Hepatobiliary: No focal liver abnormality is seen. No gallstones, gallbladder wall thickening, or biliary dilatation. Pancreas: Unremarkable. No pancreatic ductal dilatation or surrounding inflammatory changes. Spleen: Normal in size without focal abnormality. Adrenals/Urinary Tract: Adrenal glands are unremarkable. Kidneys are normal, without renal calculi, focal lesion, or hydronephrosis. Bladder is unremarkable. Stomach/Bowel: Mild sigmoid diverticulosis without superimposed acute inflammatory change. Stomach, small bowel, and large bowel are otherwise unremarkable. No evidence of obstruction or focal inflammation. Appendix normal. No free intraperitoneal gas or fluid. Vascular/Lymphatic: No significant vascular findings are present. No enlarged abdominal or pelvic lymph nodes. Reproductive: Prostate is unremarkable. Other: No abdominal wall hernia Musculoskeletal: No acute bone abnormality. No lytic or blastic bone lesion. Review of the MIP images confirms the above findings. IMPRESSION: 1. No pulmonary embolism. 2. Dense consolidation within the posterior basal right lower lobe in keeping with acute lobar pneumonia in the appropriate clinical setting. 3. Mild coronary artery calcification. Mild global cardiomegaly. 4. No acute intra-abdominal pathology identified. 5. Mild sigmoid diverticulosis without superimposed  acute inflammatory change. Electronically Signed   By: Helyn Numbers M.D.   On: 08/02/2022 19:21   DG Chest Portable 1 View  Result Date: 08/02/2022 CLINICAL DATA:  Shortness of breath. EXAM: PORTABLE CHEST 1 VIEW COMPARISON:  01/26/2016 FINDINGS: The heart size and mediastinal contours are within normal limits. Both lungs are clear. The visualized skeletal structures are unremarkable. IMPRESSION: No active disease. Electronically Signed   By: Elige Ko M.D.   On: 08/02/2022 17:21    EKG: My personal interpretation of EKG shows: sinus tachycardia    Assessment/Plan Principal Problem:   RLL pneumonia Active Problems:   AKI (acute kidney injury) (HCC)   Human immunodeficiency virus (HIV) disease   HTN (hypertension)   Uncontrolled type 2 diabetes mellitus with hyperglycemia, without long-term current use of insulin (HCC)   Tobacco abuse   Gender dysphoria    Assessment and Plan: * RLL pneumonia Observation telemetry bed. IV rocephin/zithromax. Test for PCP pneumonia. Start empiric bactrim. Monitor renal function while taking bactrim.  AKI (acute kidney injury) (HCC) Start IVF. Monitor Scr during therapy with bactrim. Avoid nsaid, nephrotoxic agents.  Tobacco abuse Advised to stop smoking. Prn nicotine patch.  Uncontrolled type 2 diabetes mellitus with hyperglycemia, without long-term current use of insulin (HCC) Check A1c. Add SSI.  HTN (hypertension) Not taking any meds at all at home.  Human immunodeficiency virus (HIV) disease Pt states she(he) has  not had any treatment for HIV disease in over 1 year.  Gender dysphoria Transfemale. Identifies as male.   DVT prophylaxis: SQ Heparin Code Status: Full Code Family Communication: no family at bedside  Disposition Plan: return home  Consults called: none  Admission status: Observation, Telemetry bed   Carollee Herter, DO [facility] 08/02/2022, 10:01 PM

## 2022-08-02 NOTE — Assessment & Plan Note (Signed)
Transfemale. Identifies as male.

## 2022-08-02 NOTE — Assessment & Plan Note (Signed)
Pt states she(he) has not had any treatment for HIV disease in over 1 year.

## 2022-08-02 NOTE — Subjective & Objective (Signed)
CC: SOB HPI: 47 year old transgender male history of untreated HIV, untreated hypertension, untreated type 2 diabetes, presents to the ER today with a 4-day history of shortness of breath, malaise.  Has had chills for about 2 days.  Has had a cough but nonproductive.  No vomiting or diarrhea.  Patient has been off of HIV medications for over a year now.  She does not see a primary care physician.  She is not taking any medications at all for her diabetes, hypertension or HIV disease.  On arrival febrile to 103 Fahrenheit, heart rate 145, blood pressure 152/94.  Sats were 100% on room air.  Labs showed a white count of 6.9, hemoglobin 12.3, platelets 142  Sodium 131, BUN of 16, creatinine 1.5, total protein 9, albumin 3.2 glucose of 146  COVID-negative, influenza negative, RSV negative  UA showed a specific gravity 1.034  CTPA demonstrated dense consolidation within the right lower lobe.  Negative for pulmonary embolism.  Tried hospitalist contacted for admission.

## 2022-08-02 NOTE — ED Notes (Signed)
Notified Amy is resp for the need of a ABG on patient.

## 2022-08-02 NOTE — Assessment & Plan Note (Signed)
Check A1c. Add SSI. 

## 2022-08-02 NOTE — ED Triage Notes (Signed)
Pt c/o URI symptoms including malaise, chills, nausea for two days.

## 2022-08-02 NOTE — Assessment & Plan Note (Signed)
Observation telemetry bed. IV rocephin/zithromax. Test for PCP pneumonia. Start empiric bactrim. Monitor renal function while taking bactrim.

## 2022-08-03 ENCOUNTER — Other Ambulatory Visit: Payer: Self-pay

## 2022-08-03 ENCOUNTER — Encounter (HOSPITAL_COMMUNITY): Payer: Self-pay | Admitting: Internal Medicine

## 2022-08-03 DIAGNOSIS — J189 Pneumonia, unspecified organism: Secondary | ICD-10-CM | POA: Diagnosis not present

## 2022-08-03 DIAGNOSIS — N179 Acute kidney failure, unspecified: Secondary | ICD-10-CM | POA: Diagnosis not present

## 2022-08-03 DIAGNOSIS — B20 Human immunodeficiency virus [HIV] disease: Secondary | ICD-10-CM | POA: Diagnosis not present

## 2022-08-03 DIAGNOSIS — I10 Essential (primary) hypertension: Secondary | ICD-10-CM | POA: Diagnosis not present

## 2022-08-03 LAB — COMPREHENSIVE METABOLIC PANEL
ALT: 27 U/L (ref 0–44)
AST: 29 U/L (ref 15–41)
Albumin: 2.6 g/dL — ABNORMAL LOW (ref 3.5–5.0)
Alkaline Phosphatase: 38 U/L (ref 38–126)
Anion gap: 5 (ref 5–15)
BUN: 17 mg/dL (ref 6–20)
CO2: 24 mmol/L (ref 22–32)
Calcium: 7.6 mg/dL — ABNORMAL LOW (ref 8.9–10.3)
Chloride: 105 mmol/L (ref 98–111)
Creatinine, Ser: 1.32 mg/dL — ABNORMAL HIGH (ref 0.61–1.24)
GFR, Estimated: >60 mL/min (ref >60)
Glucose, Bld: 213 mg/dL — ABNORMAL HIGH (ref 70–99)
Potassium: 4.4 mmol/L (ref 3.5–5.1)
Sodium: 134 mmol/L — ABNORMAL LOW (ref 135–145)
Total Bilirubin: 0.9 mg/dL (ref 0.3–1.2)
Total Protein: 8 g/dL (ref 6.5–8.1)

## 2022-08-03 LAB — RAPID URINE DRUG SCREEN, HOSP PERFORMED
Amphetamines: POSITIVE — AB
Barbiturates: NOT DETECTED
Benzodiazepines: NOT DETECTED
Cocaine: NOT DETECTED
Opiates: NOT DETECTED
Tetrahydrocannabinol: POSITIVE — AB

## 2022-08-03 LAB — CBC WITH DIFFERENTIAL/PLATELET
Abs Immature Granulocytes: 0.03 10*3/uL (ref 0.00–0.07)
Basophils Absolute: 0 10*3/uL (ref 0.0–0.1)
Basophils Relative: 0 %
Eosinophils Absolute: 0 10*3/uL (ref 0.0–0.5)
Eosinophils Relative: 0 %
HCT: 38.1 % — ABNORMAL LOW (ref 39.0–52.0)
Hemoglobin: 12.4 g/dL — ABNORMAL LOW (ref 13.0–17.0)
Immature Granulocytes: 1 %
Lymphocytes Relative: 19 %
Lymphs Abs: 0.9 10*3/uL (ref 0.7–4.0)
MCH: 29 pg (ref 26.0–34.0)
MCHC: 32.5 g/dL (ref 30.0–36.0)
MCV: 89.2 fL (ref 80.0–100.0)
Monocytes Absolute: 0.2 10*3/uL (ref 0.1–1.0)
Monocytes Relative: 4 %
Neutro Abs: 3.4 10*3/uL (ref 1.7–7.7)
Neutrophils Relative %: 76 %
Platelets: 141 10*3/uL — ABNORMAL LOW (ref 150–400)
RBC: 4.27 MIL/uL (ref 4.22–5.81)
RDW: 14.7 % (ref 11.5–15.5)
WBC: 4.5 10*3/uL (ref 4.0–10.5)
nRBC: 0 % (ref 0.0–0.2)

## 2022-08-03 LAB — LACTATE DEHYDROGENASE: LDH: 247 U/L — ABNORMAL HIGH (ref 98–192)

## 2022-08-03 LAB — STREP PNEUMONIAE URINARY ANTIGEN: Strep Pneumo Urinary Antigen: NEGATIVE

## 2022-08-03 LAB — T-HELPER CELLS (CD4) COUNT (NOT AT ARMC)
CD4 % Helper T Cell: 8 % — ABNORMAL LOW (ref 33–65)
CD4 T Cell Abs: 131 /uL — ABNORMAL LOW (ref 400–1790)

## 2022-08-03 LAB — HEMOGLOBIN A1C
Hgb A1c MFr Bld: 7.3 % — ABNORMAL HIGH (ref 4.8–5.6)
Mean Plasma Glucose: 162.81 mg/dL

## 2022-08-03 LAB — CBG MONITORING, ED
Glucose-Capillary: 197 mg/dL — ABNORMAL HIGH (ref 70–99)
Glucose-Capillary: 191 mg/dL — ABNORMAL HIGH (ref 70–99)

## 2022-08-03 LAB — GLUCOSE, CAPILLARY
Glucose-Capillary: 169 mg/dL — ABNORMAL HIGH (ref 70–99)
Glucose-Capillary: 215 mg/dL — ABNORMAL HIGH (ref 70–99)

## 2022-08-03 MED ORDER — GUAIFENESIN ER 600 MG PO TB12
1200.0000 mg | ORAL_TABLET | Freq: Two times a day (BID) | ORAL | Status: DC
  Administered 2022-08-03 – 2022-08-04 (×3): 1200 mg via ORAL
  Filled 2022-08-03 (×3): qty 2

## 2022-08-03 MED ORDER — IPRATROPIUM-ALBUTEROL 0.5-2.5 (3) MG/3ML IN SOLN
3.0000 mL | Freq: Three times a day (TID) | RESPIRATORY_TRACT | Status: DC
  Administered 2022-08-03 – 2022-08-04 (×4): 3 mL via RESPIRATORY_TRACT
  Filled 2022-08-03 (×4): qty 3

## 2022-08-03 MED ORDER — METOPROLOL TARTRATE 25 MG PO TABS: 25.0000 mg | ORAL_TABLET | Freq: Two times a day (BID) | ORAL | Status: DC

## 2022-08-03 MED ORDER — SODIUM CHLORIDE 0.9 % IV SOLN: INTRAVENOUS | Status: DC

## 2022-08-03 MED ORDER — FAMOTIDINE 20 MG PO TABS
20.0000 mg | ORAL_TABLET | Freq: Every day | ORAL | Status: DC
  Administered 2022-08-03 – 2022-08-04 (×2): 20 mg via ORAL
  Filled 2022-08-03 (×2): qty 1

## 2022-08-03 MED ORDER — AMLODIPINE BESYLATE 10 MG PO TABS
10.0000 mg | ORAL_TABLET | Freq: Every day | ORAL | Status: DC
  Administered 2022-08-03 – 2022-08-04 (×2): 10 mg via ORAL
  Filled 2022-08-03 (×2): qty 1

## 2022-08-03 MED ORDER — LORATADINE 10 MG PO TABS
10.0000 mg | ORAL_TABLET | Freq: Every day | ORAL | Status: DC
  Administered 2022-08-03 – 2022-08-04 (×2): 10 mg via ORAL
  Filled 2022-08-03 (×2): qty 1

## 2022-08-03 MED ORDER — BICTEGRAVIR-EMTRICITAB-TENOFOV 50-200-25 MG PO TABS
1.0000 | ORAL_TABLET | Freq: Every day | ORAL | Status: DC
  Administered 2022-08-04: 1 via ORAL
  Filled 2022-08-03 (×2): qty 1

## 2022-08-03 MED ORDER — SULFAMETHOXAZOLE-TRIMETHOPRIM 800-160 MG PO TABS
1.0000 | ORAL_TABLET | Freq: Every day | ORAL | Status: DC
  Administered 2022-08-03 – 2022-08-04 (×2): 1 via ORAL
  Filled 2022-08-03 (×2): qty 1

## 2022-08-03 MED ORDER — INSULIN ASPART 100 UNIT/ML IJ SOLN
0.0000 [IU] | Freq: Three times a day (TID) | INTRAMUSCULAR | Status: DC
  Administered 2022-08-03 (×2): 3 [IU] via SUBCUTANEOUS
  Administered 2022-08-04 (×2): 2 [IU] via SUBCUTANEOUS
  Filled 2022-08-03: qty 0.15

## 2022-08-03 MED ORDER — FLUTICASONE PROPIONATE 50 MCG/ACT NA SUSP
2.0000 | Freq: Every day | NASAL | Status: DC
  Administered 2022-08-03: 2 via NASAL
  Filled 2022-08-03 (×2): qty 16

## 2022-08-03 NOTE — Plan of Care (Signed)
  Problem: Activity: Goal: Ability to tolerate increased activity will improve Outcome: Progressing   Problem: Activity: Goal: Ability to tolerate increased activity will improve Outcome: Progressing   Problem: Education: Goal: Knowledge of General Education information will improve Description: Including pain rating scale, medication(s)/side effects and non-pharmacologic comfort measures Outcome: Completed/Met

## 2022-08-03 NOTE — Progress Notes (Signed)
PROGRESS NOTE    Jeremiah Stevens  YTK:[id number] DOB: 12/04/1975 DOA: 08/02/2022 PCP: Judyann Munson, MD   Chief Complaint  Patient presents with   URI    Brief Narrative:  HPI per Dr. Imogene Burn 47 year old transgender male history of untreated HIV, untreated hypertension, untreated type 2 diabetes, presents to the ER today with a 4-day history of shortness of breath, malaise.  Has had chills for about 2 days.  Has had a cough but nonproductive.  No vomiting or diarrhea.   Patient has been off of HIV medications for over a year now.  She does not see a primary care physician.  She is not taking any medications at all for her diabetes, hypertension or HIV disease.   On arrival febrile to 103 Fahrenheit, heart rate 145, blood pressure 152/94.  Sats were 100% on room air.   Labs showed a white count of 6.9, hemoglobin 12.3, platelets 142   Sodium 131, BUN of 16, creatinine 1.5, total protein 9, albumin 3.2 glucose of 146   COVID-negative, influenza negative, RSV negative   UA showed a specific gravity 1.034   CTPA demonstrated dense consolidation within the right lower lobe.  Negative for pulmonary embolism.   Tried hospitalist contacted for admission.   Assessment & Plan:   Principal Problem:   RLL pneumonia Active Problems:   AKI (acute kidney injury) (HCC)   Human immunodeficiency virus (HIV) disease   HTN (hypertension)   Uncontrolled type 2 diabetes mellitus with hyperglycemia, without long-term current use of insulin (HCC)   Tobacco abuse   Gender dysphoria   HIV infection (HCC)  #1 right lower lobe pneumonia -Patient presented with respiratory symptoms, chest x-ray done unremarkable, CT chest done consistent with a right lower lobe pneumonia. -Patient with history of HIV, has not been on any antiviral therapies over the past year as he states unable to get to visits due to transportation issues and had run out of prescriptions. -PCP DFA smear pending. -Urine  Legionella antigen pending, urine pneumococcus antigen pending. -Respiratory viral panel negative for SARS coronavirus 2 PCR, influenza A and B PCR negative. -Continue empiric IV Rocephin, IV azithromycin. -Place on Mucinex, scheduled DuoNebs, Claritin, Flonase, Pepcid..  2.  Acute kidney injury -Likely secondary to prerenal azotemia in the setting of HCTZ. -Hold HCTZ likely will not resume on discharge. -Improving with gentle hydration.  3.  Tobacco abuse -Tobacco cessation. -Nicotine patch.  4.  Hypertension -Hold HCTZ due to acute kidney injury. -Resume home regimen Norvasc and Lopressor.  5.  Uncontrolled type 2 diabetes with hyperglycemia -Hemoglobin A1c > 15.5 (10/02/2020) -CBG 197 this morning. -Hemoglobin A1c pending. -SSI. -Start Lantus 8 units daily.  6.  HIV -Patient noted to have taking Biktarvy for in the past currently out of medications over the past year as he stated has not been able to follow-up with ID clinic due to transportation issues. -Consult with ID.  7.  Gender dysphoria -Trans male.  Identifies as a male.   DVT prophylaxis: Heparin Code Status: Full Family Communication: Updated patient.  No family at bedside. Disposition: Likely home when clinically improved.  Status is: Observation The patient remains OBS appropriate and will d/c before 2 midnights.   Consultants:  Infectious disease: Dr. Thedore Mins 08/03/2022  Procedures:  CT angiogram chest 08/02/2022 CT abdomen and pelvis 08/02/2022 Chest x-ray 08/02/2022  Antimicrobials:  IV Rocephin 08/02/2022>>> IV azithromycin 08/02/2022>>>>>   Subjective: Patient sleeping but easily arousable.  Feels a little bit better than he did on  admission.  Stated and had a temp as high as 103, cough, shortness of breath x 3 days.  Overall feeling a little bit better than on admission.  Describes chest pain with coughing.  No abdominal pain.  Objective: Vitals:   08/03/22 1200 08/03/22 1300 08/03/22 1430  08/03/22 1500  BP: (!) 133/105 (!) 145/25 131/88 (!) 143/99  Pulse: 88 90 (!) 101 99  Resp:   16   Temp:   98.4 F (36.9 C)   TempSrc:   Oral   SpO2: 97% 99% 96% 95%  Weight:      Height:        Intake/Output Summary (Last 24 hours) at 08/03/2022 1557 Last data filed at 08/03/2022 0046 Gross per 24 hour  Intake 2349.69 ml  Output 400 ml  Net 1949.69 ml   Filed Weights   08/02/22 1750  Weight: 118 kg    Examination:  General exam: NAD. Respiratory system: Some coarse scattered breath sounds in the right base.  No wheezing.  Fair air movement.  No significant crackles.  Speaking in full sentences. Cardiovascular system: S1 & S2 heard, RRR. No JVD, murmurs, rubs, gallops or clicks. No pedal edema. Gastrointestinal system: Abdomen is obese, nondistended, soft and nontender. No organomegaly or masses felt. Normal bowel sounds heard. Central nervous system: Alert and oriented. No focal neurological deficits. Extremities: Symmetric 5 x 5 power. Skin: No rashes, lesions or ulcers Psychiatry: Judgement and insight appear normal. Mood & affect appropriate.     Data Reviewed: I have personally reviewed following labs and imaging studies  CBC: Recent Labs  Lab 08/02/22 1719 08/03/22 0500  WBC 6.9 4.5  NEUTROABS 3.6 3.4  HGB 12.3* 12.4*  HCT 37.5* 38.1*  MCV 87.0 89.2  PLT 142* 141*    Basic Metabolic Panel: Recent Labs  Lab 08/02/22 1719 08/03/22 0500  NA 131* 134*  K 3.6 4.4  CL 101 105  CO2 23 24  GLUCOSE 146* 213*  BUN 16 17  CREATININE 1.53* 1.32*  CALCIUM 7.7* 7.6*    GFR: Estimated Creatinine Clearance (by C-G formula based on SCr of 1.32 mg/dL (H)) Male: 01.6 mL/min (A) Male: 92.8 mL/min (A)  Liver Function Tests: Recent Labs  Lab 08/02/22 1719 08/03/22 0500  AST 31 29  ALT 28 27  ALKPHOS 43 38  BILITOT 0.6 0.9  PROT 9.0* 8.0  ALBUMIN 3.2* 2.6*    CBG: Recent Labs  Lab 08/02/22 2243 08/03/22 0749 08/03/22 1145  GLUCAP 131* 197*  191*     Recent Results (from the past 240 hour(s))  Resp panel by RT-PCR (RSV, Flu A&B, Covid) Anterior Nasal Swab     Status: None   Collection Time: 08/02/22  5:19 PM   Specimen: Anterior Nasal Swab  Result Value Ref Range Status   SARS Coronavirus 2 by RT PCR NEGATIVE NEGATIVE Final    Comment: (NOTE) SARS-CoV-2 target nucleic acids are NOT DETECTED.  The SARS-CoV-2 RNA is generally detectable in upper respiratory specimens during the acute phase of infection. The lowest concentration of SARS-CoV-2 viral copies this assay can detect is 138 copies/mL. A negative result does not preclude SARS-Cov-2 infection and should not be used as the sole basis for treatment or other patient management decisions. A negative result may occur with  improper specimen collection/handling, submission of specimen other than nasopharyngeal swab, presence of viral mutation(s) within the areas targeted by this assay, and inadequate number of viral copies(<138 copies/mL). A negative result must be combined with clinical  observations, patient history, and epidemiological information. The expected result is Negative.  Fact Sheet for Patients:  BloggerCourse.com  Fact Sheet for Healthcare Providers:  SeriousBroker.it  This test is no t yet approved or cleared by the [place] FDA and  has been authorized for detection and/or diagnosis of SARS-CoV-2 by FDA under an Emergency Use Authorization (EUA). This EUA will remain  in effect (meaning this test can be used) for the duration of the COVID-19 declaration under Section 564(b)(1) of the Act, 21 U.S.C.section 360bbb-3(b)(1), unless the authorization is terminated  or revoked sooner.       Influenza A by PCR NEGATIVE NEGATIVE Final   Influenza B by PCR NEGATIVE NEGATIVE Final    Comment: (NOTE) The Xpert Xpress SARS-CoV-2/FLU/RSV plus assay is intended as an aid in the diagnosis of influenza  from Nasopharyngeal swab specimens and should not be used as a sole basis for treatment. Nasal washings and aspirates are unacceptable for Xpert Xpress SARS-CoV-2/FLU/RSV testing.  Fact Sheet for Patients: BloggerCourse.com  Fact Sheet for Healthcare Providers: SeriousBroker.it  This test is not yet approved or cleared by the [place] FDA and has been authorized for detection and/or diagnosis of SARS-CoV-2 by FDA under an Emergency Use Authorization (EUA). This EUA will remain in effect (meaning this test can be used) for the duration of the COVID-19 declaration under Section 564(b)(1) of the Act, 21 U.S.C. section 360bbb-3(b)(1), unless the authorization is terminated or revoked.     Resp Syncytial Virus by PCR NEGATIVE NEGATIVE Final    Comment: (NOTE) Fact Sheet for Patients: BloggerCourse.com  Fact Sheet for Healthcare Providers: SeriousBroker.it  This test is not yet approved or cleared by the [place] FDA and has been authorized for detection and/or diagnosis of SARS-CoV-2 by FDA under an Emergency Use Authorization (EUA). This EUA will remain in effect (meaning this test can be used) for the duration of the COVID-19 declaration under Section 564(b)(1) of the Act, 21 U.S.C. section 360bbb-3(b)(1), unless the authorization is terminated or revoked.  Performed at [facility], [address], [place], [place] [address]          Radiology Studies: CT Angio Chest PE W and/or Wo Contrast  Result Date: 08/02/2022 CLINICAL DATA:  Cough, acute nonlocalized abdominal pain, malaise. Pulmonary embolism, high probability. EXAM: CT ANGIOGRAPHY CHEST CT ABDOMEN AND PELVIS WITH CONTRAST TECHNIQUE: Multidetector CT imaging of the chest was performed using the standard protocol during bolus administration of intravenous contrast. Multiplanar CT image  reconstructions and MIPs were obtained to evaluate the vascular anatomy. Multidetector CT imaging of the abdomen and pelvis was performed using the standard protocol during bolus administration of intravenous contrast. RADIATION DOSE REDUCTION: This exam was performed according to the departmental dose-optimization program which includes automated exposure control, adjustment of the mA and/or kV according to patient size and/or use of iterative reconstruction technique. CONTRAST:  40mL OMNIPAQUE IOHEXOL 350 MG/ML SOLN COMPARISON:  None Available. FINDINGS: CTA CHEST FINDINGS Cardiovascular: Imaging, particularly within the lung bases, is limited by respiratory motion artifact. There is adequate opacification of the pulmonary arterial tree. No intraluminal filling defect identified to suggest acute pulmonary embolism through the segmental level. The central pulmonary arteries are of normal caliber. Mild coronary artery calcification. Mild global cardiomegaly. No pericardial effusion. The thoracic aorta is unremarkable. Mediastinum/Nodes: Shotty right hilar and subcarinal adenopathy may be reactive in nature. No frankly pathologic thoracic adenopathy. Visualized thyroid is unremarkable. Esophagus is unremarkable. Lungs/Pleura: There is dense consolidation within the posterior  basal right lower lobe in keeping with changes of acute lobar pneumonia in the appropriate clinical setting. No pneumothorax or pleural effusion. Central airways are widely patent. Musculoskeletal: No acute bone abnormality. No lytic or blastic bone lesion. Review of the MIP images confirms the above findings. CT ABDOMEN and PELVIS FINDINGS Hepatobiliary: No focal liver abnormality is seen. No gallstones, gallbladder wall thickening, or biliary dilatation. Pancreas: Unremarkable. No pancreatic ductal dilatation or surrounding inflammatory changes. Spleen: Normal in size without focal abnormality. Adrenals/Urinary Tract: Adrenal glands are  unremarkable. Kidneys are normal, without renal calculi, focal lesion, or hydronephrosis. Bladder is unremarkable. Stomach/Bowel: Mild sigmoid diverticulosis without superimposed acute inflammatory change. Stomach, small bowel, and large bowel are otherwise unremarkable. No evidence of obstruction or focal inflammation. Appendix normal. No free intraperitoneal gas or fluid. Vascular/Lymphatic: No significant vascular findings are present. No enlarged abdominal or pelvic lymph nodes. Reproductive: Prostate is unremarkable. Other: No abdominal wall hernia Musculoskeletal: No acute bone abnormality. No lytic or blastic bone lesion. Review of the MIP images confirms the above findings. IMPRESSION: 1. No pulmonary embolism. 2. Dense consolidation within the posterior basal right lower lobe in keeping with acute lobar pneumonia in the appropriate clinical setting. 3. Mild coronary artery calcification. Mild global cardiomegaly. 4. No acute intra-abdominal pathology identified. 5. Mild sigmoid diverticulosis without superimposed acute inflammatory change. Electronically Signed   By: Fidela Salisbury M.D.   On: 08/02/2022 19:21   CT ABDOMEN PELVIS W CONTRAST  Result Date: 08/02/2022 CLINICAL DATA:  Cough, acute nonlocalized abdominal pain, malaise. Pulmonary embolism, high probability. EXAM: CT ANGIOGRAPHY CHEST CT ABDOMEN AND PELVIS WITH CONTRAST TECHNIQUE: Multidetector CT imaging of the chest was performed using the standard protocol during bolus administration of intravenous contrast. Multiplanar CT image reconstructions and MIPs were obtained to evaluate the vascular anatomy. Multidetector CT imaging of the abdomen and pelvis was performed using the standard protocol during bolus administration of intravenous contrast. RADIATION DOSE REDUCTION: This exam was performed according to the departmental dose-optimization program which includes automated exposure control, adjustment of the mA and/or kV according to patient  size and/or use of iterative reconstruction technique. CONTRAST:  46mL OMNIPAQUE IOHEXOL 350 MG/ML SOLN COMPARISON:  None Available. FINDINGS: CTA CHEST FINDINGS Cardiovascular: Imaging, particularly within the lung bases, is limited by respiratory motion artifact. There is adequate opacification of the pulmonary arterial tree. No intraluminal filling defect identified to suggest acute pulmonary embolism through the segmental level. The central pulmonary arteries are of normal caliber. Mild coronary artery calcification. Mild global cardiomegaly. No pericardial effusion. The thoracic aorta is unremarkable. Mediastinum/Nodes: Shotty right hilar and subcarinal adenopathy may be reactive in nature. No frankly pathologic thoracic adenopathy. Visualized thyroid is unremarkable. Esophagus is unremarkable. Lungs/Pleura: There is dense consolidation within the posterior basal right lower lobe in keeping with changes of acute lobar pneumonia in the appropriate clinical setting. No pneumothorax or pleural effusion. Central airways are widely patent. Musculoskeletal: No acute bone abnormality. No lytic or blastic bone lesion. Review of the MIP images confirms the above findings. CT ABDOMEN and PELVIS FINDINGS Hepatobiliary: No focal liver abnormality is seen. No gallstones, gallbladder wall thickening, or biliary dilatation. Pancreas: Unremarkable. No pancreatic ductal dilatation or surrounding inflammatory changes. Spleen: Normal in size without focal abnormality. Adrenals/Urinary Tract: Adrenal glands are unremarkable. Kidneys are normal, without renal calculi, focal lesion, or hydronephrosis. Bladder is unremarkable. Stomach/Bowel: Mild sigmoid diverticulosis without superimposed acute inflammatory change. Stomach, small bowel, and large bowel are otherwise unremarkable. No evidence of obstruction or focal inflammation. Appendix  normal. No free intraperitoneal gas or fluid. Vascular/Lymphatic: No significant vascular  findings are present. No enlarged abdominal or pelvic lymph nodes. Reproductive: Prostate is unremarkable. Other: No abdominal wall hernia Musculoskeletal: No acute bone abnormality. No lytic or blastic bone lesion. Review of the MIP images confirms the above findings. IMPRESSION: 1. No pulmonary embolism. 2. Dense consolidation within the posterior basal right lower lobe in keeping with acute lobar pneumonia in the appropriate clinical setting. 3. Mild coronary artery calcification. Mild global cardiomegaly. 4. No acute intra-abdominal pathology identified. 5. Mild sigmoid diverticulosis without superimposed acute inflammatory change. Electronically Signed   By: Fidela Salisbury M.D.   On: 08/02/2022 19:21   DG Chest Portable 1 View  Result Date: 08/02/2022 CLINICAL DATA:  Shortness of breath. EXAM: PORTABLE CHEST 1 VIEW COMPARISON:  01/26/2016 FINDINGS: The heart size and mediastinal contours are within normal limits. Both lungs are clear. The visualized skeletal structures are unremarkable. IMPRESSION: No active disease. Electronically Signed   By: Kathreen Devoid M.D.   On: 08/02/2022 17:21        Scheduled Meds:  azithromycin  500 mg Oral Q2000   bictegravir-emtricitabine-tenofovir AF  1 tablet Oral Daily   famotidine  20 mg Oral Daily   fluticasone  2 spray Each Nare Daily   guaiFENesin  1,200 mg Oral BID   heparin  5,000 Units Subcutaneous Q8H   insulin aspart  0-15 Units Subcutaneous TID WC   insulin aspart  0-15 Units Subcutaneous TID WC   insulin aspart  0-5 Units Subcutaneous QHS   ipratropium-albuterol  3 mL Nebulization TID   loratadine  10 mg Oral Daily   metoprolol tartrate  12.5 mg Oral BID   nicotine  21 mg Transdermal Daily   sulfamethoxazole-trimethoprim  1 tablet Oral Daily   Continuous Infusions:  sodium chloride 125 mL/hr at 08/03/22 0917   cefTRIAXone (ROCEPHIN)  IV       LOS: 0 days    Time spent: 35 minutes    Irine Seal, MD [facility]   To  contact the attending provider between 7A-7P or the covering provider during after hours 7P-7A, please log into the web site [email or web address] and access using universal [place] password for that web site. If you do not have the password, please call the hospital operator.  08/03/2022, 3:57 PM

## 2022-08-03 NOTE — Plan of Care (Signed)
  Problem: Coping: Goal: Ability to adjust to condition or change in health will improve Outcome: Progressing   Problem: Health Behavior/Discharge Planning: Goal: Ability to identify and utilize available resources and services will improve Outcome: Progressing Goal: Ability to manage health-related needs will improve Outcome: Progressing   Problem: Nutritional: Goal: Maintenance of adequate nutrition will improve Outcome: Progressing   Problem: Skin Integrity: Goal: Risk for impaired skin integrity will decrease Outcome: Progressing   Problem: Tissue Perfusion: Goal: Adequacy of tissue perfusion will improve Outcome: Progressing

## 2022-08-03 NOTE — Consult Note (Signed)
[facility]    Date of Admission:  08/02/2022   Total days of inpatient antibiotics 1        Reason for Consult: PNA    Principal Problem:   RLL pneumonia Active Problems:   Human immunodeficiency virus (HIV) disease   HTN (hypertension)   Gender dysphoria   AKI (acute kidney injury) ([place])   Uncontrolled type 2 diabetes mellitus with hyperglycemia, without long-term current use of insulin (HCC)   Tobacco abuse   Assessment: 5 year transgender male(M->F) admitted with RLL pneumonia, ID engaged for c/f PJP pneumonia  #HIV/AIDS #Medication non-adherence #CAP #Polysubstance abuse(smoked meth 2 weeks ago)- Denies IVDA #Uncontrolled DM A1c >15.5 -RSV/Flu/COVID swab negative -LDH 274, CD4 131 -CT showed dense consolidation in posterior right lower lobe c/w acute lobar PNA, mild sigmoid diverticulosis w/o superimposed acute inflammatory change -Although LDH is elevated but right lobar PNA is an unusual presentation. Generally, onset of symptoms is about 2 weeks and pt presented with 3 days of fever. He denies a cough. Although CD4 is low, this could be in the setting of acute illness. Regardless, pt needs to get back in care for HIV. Pt states that she has not been taking ART for the last year because she thinks one of her meds made her "tendon snap". I counseled pt that Phillips Odor is unlikely to have that as a side effect profile. She is amenable to restarting biktarvy Recommendations:  -Continue ceftriaxone and azithromycin  -Start biktarvy -Start bactrim for PJP prophylaxis (1DS qd) -Fungitell, legionella and sputum Cx pending -Follow blood Cx -Follow HIV viral load -HCV quant, RPR and GC urine   I have personally spent 120 minutes involved in face-to-face and non-face-to-face activities for this patient on the day of the visit. Professional time spent includes the following activities: Preparing to see the patient (review of tests), Obtaining  and/or reviewing separately obtained history (admission/discharge record), Performing a medically appropriate examination and/or evaluation , Ordering medications/tests/procedures, referring and communicating with other health care professionals, Documenting clinical information in the EMR, Independently interpreting results (not separately reported), Communicating results to the patient/family/caregiver, Counseling and educating the patient/family/caregiver and Care coordination (not separately reported).   Microbiology:   Antibiotics: Ceftriaxone and azithormycin  Cultures: Blood 1/21    HPI: Jeremiah Stevens is a 47 y.o. adult with history of HIV off of ART untreated hypertension, uncontrolled diabetes presented with 4-day history of shortness of breath and malaise.  On arrival patient had temp to 103, WBC 6.9K.  COVID/flu/RSV negative.  CT showed  posterior right lower lobe c/w acute lobar PNA, mild sigmoid diverticulosis w/o superimposed acute inflammatory change.  Started on ceftriaxone.  Initially started on Bactrim empiric PJP treatment.  ID engaged for further recommendations.  Review of Systems: Review of Systems  All other systems reviewed and are negative.   Past Medical History:  Diagnosis Date   Diabetes mellitus without complication (HCC)    Heartburn    occasional; OTC as needed   Herpes genitalis in men    HIV disease ([place])    Hyperlipidemia    Hypertension    under control with med., has been on med. x 1 yr.   Lateral malleolar fracture 09/02/2013   left   Migraines    Tear of deltoid ligament of left ankle 09/02/2013   Type 2 diabetes mellitus with hyperosmolar nonketotic hyperglycemia ([place]) 02/10/2020    Social History   Tobacco Use  Smoking status: Every Day    Packs/day: 0.50    Years: 15.00    Total pack years: 7.50    Types: Cigarettes   Smokeless tobacco: Never  Vaping Use   Vaping Use: Former  Substance Use Topics   Alcohol use: No   Drug use:  Yes    Frequency: 7.0 times per week    Types: Marijuana    Comment: Daily     Family History  Problem Relation Age of Onset   Hypertension Mother    Scheduled Meds:  azithromycin  500 mg Oral Q2000   fluticasone  2 spray Each Nare Daily   guaiFENesin  1,200 mg Oral BID   heparin  5,000 Units Subcutaneous Q8H   insulin aspart  0-15 Units Subcutaneous TID WC   insulin aspart  0-15 Units Subcutaneous TID WC   insulin aspart  0-5 Units Subcutaneous QHS   ipratropium-albuterol  3 mL Nebulization TID   loratadine  10 mg Oral Daily   metoprolol tartrate  12.5 mg Oral BID   nicotine  21 mg Transdermal Daily   Continuous Infusions:  sodium chloride 125 mL/hr at 08/03/22 0917   cefTRIAXone (ROCEPHIN)  IV     PRN Meds:.acetaminophen **OR** acetaminophen, ipratropium-albuterol, melatonin, ondansetron **OR** ondansetron (ZOFRAN) IV Allergies  Allergen Reactions   Penicillins Anaphylaxis   Peanut-Containing Drug Products Other (See Comments)    WALNUTS - SORES ON TONGUE   Sustiva [Efavirenz] Rash    OBJECTIVE: Blood pressure (!) 145/25, pulse 90, temperature 98.5 F (36.9 C), temperature source Oral, resp. rate 18, height 6' (1.829 m), weight 118 kg, SpO2 99 %.  Physical Exam Constitutional:      General: She is not in acute distress.    Appearance: She is normal weight. She is not toxic-appearing.  HENT:     Head: Normocephalic and atraumatic.     Right Ear: External ear normal.     Left Ear: External ear normal.     Nose: No congestion or rhinorrhea.     Mouth/Throat:     Mouth: Mucous membranes are moist.     Pharynx: Oropharynx is clear.  Eyes:     Extraocular Movements: Extraocular movements intact.     Conjunctiva/sclera: Conjunctivae normal.     Pupils: Pupils are equal, round, and reactive to light.  Cardiovascular:     Rate and Rhythm: Normal rate and regular rhythm.     Heart sounds: No murmur heard.    No friction rub. No gallop.  Pulmonary:     Effort:  Pulmonary effort is normal.     Breath sounds: Normal breath sounds.  Abdominal:     General: Abdomen is flat. Bowel sounds are normal.     Palpations: Abdomen is soft.  Musculoskeletal:        General: No swelling. Normal range of motion.     Cervical back: Normal range of motion and neck supple.  Skin:    General: Skin is warm and dry.  Neurological:     General: No focal deficit present.     Mental Status: She is oriented to person, place, and time.  Psychiatric:        Mood and Affect: Mood normal.     Lab Results Lab Results  Component Value Date   WBC 4.5 08/03/2022   HGB 12.4 (L) 08/03/2022   HCT 38.1 (L) 08/03/2022   MCV 89.2 08/03/2022   PLT 141 (L) 08/03/2022    Lab Results  Component Value Date  CREATININE 1.32 (H) 08/03/2022   BUN 17 08/03/2022   NA 134 (L) 08/03/2022   K 4.4 08/03/2022   CL 105 08/03/2022   CO2 24 08/03/2022    Lab Results  Component Value Date   ALT 27 08/03/2022   AST 29 08/03/2022   ALKPHOS 38 08/03/2022   BILITOT 0.9 08/03/2022       Danelle Earthly, MD [facility] 08/03/2022, 2:24 PM

## 2022-08-04 ENCOUNTER — Other Ambulatory Visit (HOSPITAL_COMMUNITY): Payer: Self-pay

## 2022-08-04 DIAGNOSIS — J181 Lobar pneumonia, unspecified organism: Secondary | ICD-10-CM | POA: Diagnosis present

## 2022-08-04 DIAGNOSIS — Z91148 Patient's other noncompliance with medication regimen for other reason: Secondary | ICD-10-CM | POA: Diagnosis not present

## 2022-08-04 DIAGNOSIS — F17210 Nicotine dependence, cigarettes, uncomplicated: Secondary | ICD-10-CM | POA: Diagnosis not present

## 2022-08-04 DIAGNOSIS — E1165 Type 2 diabetes mellitus with hyperglycemia: Secondary | ICD-10-CM | POA: Diagnosis present

## 2022-08-04 DIAGNOSIS — B20 Human immunodeficiency virus [HIV] disease: Secondary | ICD-10-CM

## 2022-08-04 DIAGNOSIS — A530 Latent syphilis, unspecified as early or late: Secondary | ICD-10-CM

## 2022-08-04 DIAGNOSIS — Z8249 Family history of ischemic heart disease and other diseases of the circulatory system: Secondary | ICD-10-CM | POA: Diagnosis not present

## 2022-08-04 DIAGNOSIS — I10 Essential (primary) hypertension: Secondary | ICD-10-CM | POA: Diagnosis not present

## 2022-08-04 DIAGNOSIS — F649 Gender identity disorder, unspecified: Secondary | ICD-10-CM

## 2022-08-04 DIAGNOSIS — Z720 Tobacco use: Secondary | ICD-10-CM

## 2022-08-04 DIAGNOSIS — N179 Acute kidney failure, unspecified: Secondary | ICD-10-CM | POA: Diagnosis not present

## 2022-08-04 DIAGNOSIS — E785 Hyperlipidemia, unspecified: Secondary | ICD-10-CM | POA: Diagnosis present

## 2022-08-04 DIAGNOSIS — J189 Pneumonia, unspecified organism: Secondary | ICD-10-CM | POA: Diagnosis not present

## 2022-08-04 DIAGNOSIS — Z1152 Encounter for screening for COVID-19: Secondary | ICD-10-CM | POA: Diagnosis not present

## 2022-08-04 DIAGNOSIS — F640 Transsexualism: Secondary | ICD-10-CM | POA: Diagnosis present

## 2022-08-04 LAB — BASIC METABOLIC PANEL
Anion gap: 5 (ref 5–15)
BUN: 25 mg/dL — ABNORMAL HIGH (ref 6–20)
CO2: 24 mmol/L (ref 22–32)
Calcium: 7.9 mg/dL — ABNORMAL LOW (ref 8.9–10.3)
Chloride: 108 mmol/L (ref 98–111)
Creatinine, Ser: 1.19 mg/dL (ref 0.61–1.24)
GFR, Estimated: >60 mL/min (ref >60)
Glucose, Bld: 156 mg/dL — ABNORMAL HIGH (ref 70–99)
Potassium: 4.2 mmol/L (ref 3.5–5.1)
Sodium: 137 mmol/L (ref 135–145)

## 2022-08-04 LAB — BLOOD CULTURE ID PANEL (REFLEXED) - BCID2

## 2022-08-04 LAB — EXPECTORATED SPUTUM ASSESSMENT W GRAM STAIN, RFLX TO RESP C

## 2022-08-04 LAB — HCV RNA QUANT: HCV Quantitative: NOT DETECTED IU/mL (ref >50)

## 2022-08-04 LAB — GLUCOSE, CAPILLARY
Glucose-Capillary: 124 mg/dL — ABNORMAL HIGH (ref 70–99)
Glucose-Capillary: 124 mg/dL — ABNORMAL HIGH (ref 70–99)
Glucose-Capillary: 140 mg/dL — ABNORMAL HIGH (ref 70–99)

## 2022-08-04 LAB — CBC WITH DIFFERENTIAL/PLATELET
Abs Immature Granulocytes: 0.01 10*3/uL (ref 0.00–0.07)
Basophils Absolute: 0 10*3/uL (ref 0.0–0.1)
Basophils Relative: 0 %
Eosinophils Absolute: 0 10*3/uL (ref 0.0–0.5)
Eosinophils Relative: 0 %
HCT: 37.9 % — ABNORMAL LOW (ref 39.0–52.0)
Hemoglobin: 12.4 g/dL — ABNORMAL LOW (ref 13.0–17.0)
Immature Granulocytes: 0 %
Lymphocytes Relative: 35 %
Lymphs Abs: 1.2 10*3/uL (ref 0.7–4.0)
MCH: 28.8 pg (ref 26.0–34.0)
MCHC: 32.7 g/dL (ref 30.0–36.0)
MCV: 88.1 fL (ref 80.0–100.0)
Monocytes Absolute: 0.4 10*3/uL (ref 0.1–1.0)
Monocytes Relative: 11 %
Neutro Abs: 1.8 10*3/uL (ref 1.7–7.7)
Neutrophils Relative %: 54 %
Platelets: 143 10*3/uL — ABNORMAL LOW (ref 150–400)
RBC: 4.3 MIL/uL (ref 4.22–5.81)
RDW: 14.9 % (ref 11.5–15.5)
WBC: 3.3 10*3/uL — ABNORMAL LOW (ref 4.0–10.5)
nRBC: 0 % (ref 0.0–0.2)

## 2022-08-04 LAB — RPR
RPR Ser Ql: REACTIVE — AB
RPR Titer: 1:1 {titer}

## 2022-08-04 LAB — LEGIONELLA PNEUMOPHILA SEROGP 1 UR AG: L. pneumophila Serogp 1 Ur Ag: NEGATIVE

## 2022-08-04 MED ORDER — CEFADROXIL 500 MG PO CAPS
500.0000 mg | ORAL_CAPSULE | Freq: Two times a day (BID) | ORAL | 0 refills | Status: AC
  Filled 2022-08-04: qty 8, 4d supply, fill #0

## 2022-08-04 MED ORDER — CEFADROXIL 500 MG PO CAPS
500.0000 mg | ORAL_CAPSULE | Freq: Two times a day (BID) | ORAL | Status: DC
  Administered 2022-08-04: 500 mg via ORAL
  Filled 2022-08-04: qty 1

## 2022-08-04 MED ORDER — DOXYCYCLINE HYCLATE 100 MG PO TABS
100.0000 mg | ORAL_TABLET | Freq: Two times a day (BID) | ORAL | 0 refills | Status: AC
  Filled 2022-08-04: qty 56, 28d supply, fill #0

## 2022-08-04 MED ORDER — NICOTINE 21 MG/24HR TD PT24
21.0000 mg | MEDICATED_PATCH | Freq: Every day | TRANSDERMAL | 0 refills | Status: DC
  Filled 2022-08-04: qty 28, 28d supply, fill #0

## 2022-08-04 MED ORDER — DOXYCYCLINE HYCLATE 100 MG PO TABS: 100.0000 mg | ORAL_TABLET | Freq: Two times a day (BID) | ORAL | Status: DC

## 2022-08-04 MED ORDER — GUAIFENESIN ER 600 MG PO TB12
1200.0000 mg | ORAL_TABLET | Freq: Two times a day (BID) | ORAL | 0 refills | Status: AC
  Filled 2022-08-04: qty 20, 5d supply, fill #0

## 2022-08-04 MED ORDER — BICTEGRAVIR-EMTRICITAB-TENOFOV 50-200-25 MG PO TABS
1.0000 | ORAL_TABLET | Freq: Every day | ORAL | 1 refills | Status: DC
  Filled 2022-08-04 (×2): qty 30, 30d supply, fill #0

## 2022-08-04 MED ORDER — METOPROLOL TARTRATE 25 MG PO TABS
25.0000 mg | ORAL_TABLET | Freq: Two times a day (BID) | ORAL | 1 refills | Status: DC
  Filled 2022-08-04: qty 60, 30d supply, fill #0

## 2022-08-04 MED ORDER — FLUTICASONE PROPIONATE 50 MCG/ACT NA SUSP
2.0000 | Freq: Every day | NASAL | 0 refills | Status: DC
  Filled 2022-08-04: qty 16, 30d supply, fill #0

## 2022-08-04 MED ORDER — FAMOTIDINE 20 MG PO TABS
20.0000 mg | ORAL_TABLET | Freq: Every day | ORAL | 1 refills | Status: DC
  Filled 2022-08-04: qty 30, 30d supply, fill #0

## 2022-08-04 MED ORDER — AMLODIPINE BESYLATE 10 MG PO TABS
10.0000 mg | ORAL_TABLET | Freq: Every day | ORAL | 2 refills | Status: DC
  Filled 2022-08-04: qty 30, 30d supply, fill #0

## 2022-08-04 MED ORDER — AZITHROMYCIN 250 MG PO TABS
500.0000 mg | ORAL_TABLET | Freq: Every day | ORAL | 0 refills | Status: AC
  Filled 2022-08-04: qty 6, 3d supply, fill #0

## 2022-08-04 MED ORDER — LORATADINE 10 MG PO TABS
10.0000 mg | ORAL_TABLET | Freq: Every day | ORAL | 1 refills | Status: DC
  Filled 2022-08-04: qty 30, 30d supply, fill #0

## 2022-08-04 MED ORDER — DOXYCYCLINE HYCLATE 100 MG PO TABS
100.0000 mg | ORAL_TABLET | Freq: Two times a day (BID) | ORAL | Status: DC
  Administered 2022-08-04: 100 mg via ORAL
  Filled 2022-08-04: qty 1

## 2022-08-04 MED ORDER — SULFAMETHOXAZOLE-TRIMETHOPRIM 800-160 MG PO TABS
1.0000 | ORAL_TABLET | Freq: Every day | ORAL | 1 refills | Status: DC
  Filled 2022-08-04: qty 30, 30d supply, fill #0

## 2022-08-04 MED ORDER — COMBIVENT RESPIMAT 20-100 MCG/ACT IN AERS
INHALATION_SPRAY | RESPIRATORY_TRACT | 0 refills | Status: DC
  Filled 2022-08-04: qty 1, 30d supply, fill #0

## 2022-08-04 NOTE — Plan of Care (Signed)
  Problem: Education: Goal: Ability to describe self-care measures that may prevent or decrease complications (Diabetes Survival Skills Education) will improve Outcome: Progressing   Problem: Coping: Goal: Ability to adjust to condition or change in health will improve Outcome: Progressing   Problem: Fluid Volume: Goal: Ability to maintain a balanced intake and output will improve Outcome: Progressing   Problem: Health Behavior/Discharge Planning: Goal: Ability to identify and utilize available resources and services will improve Outcome: Progressing Goal: Ability to manage health-related needs will improve Outcome: Progressing   Problem: Metabolic: Goal: Ability to maintain appropriate glucose levels will improve Outcome: Progressing   Problem: Nutritional: Goal: Maintenance of adequate nutrition will improve Outcome: Progressing Goal: Progress toward achieving an optimal weight will improve Outcome: Progressing   Problem: Skin Integrity: Goal: Risk for impaired skin integrity will decrease Outcome: Progressing   Problem: Tissue Perfusion: Goal: Adequacy of tissue perfusion will improve Outcome: Progressing   Problem: Activity: Goal: Ability to tolerate increased activity will improve Outcome: Progressing   Problem: Clinical Measurements: Goal: Ability to maintain a body temperature in the normal range will improve Outcome: Progressing   Problem: Respiratory: Goal: Ability to maintain adequate ventilation will improve Outcome: Progressing Goal: Ability to maintain a clear airway will improve Outcome: Progressing   Problem: Health Behavior/Discharge Planning: Goal: Ability to manage health-related needs will improve Outcome: Progressing   Problem: Clinical Measurements: Goal: Ability to maintain clinical measurements within normal limits will improve Outcome: Progressing Goal: Will remain free from infection Outcome: Progressing Goal: Diagnostic test results  will improve Outcome: Progressing Goal: Respiratory complications will improve Outcome: Progressing Goal: Cardiovascular complication will be avoided Outcome: Progressing   Problem: Activity: Goal: Risk for activity intolerance will decrease Outcome: Progressing   Problem: Nutrition: Goal: Adequate nutrition will be maintained Outcome: Progressing   Problem: Coping: Goal: Level of anxiety will decrease Outcome: Progressing   Problem: Elimination: Goal: Will not experience complications related to bowel motility Outcome: Progressing Goal: Will not experience complications related to urinary retention Outcome: Progressing   Problem: Pain Managment: Goal: General experience of comfort will improve Outcome: Progressing   Problem: Safety: Goal: Ability to remain free from injury will improve Outcome: Progressing   Problem: Skin Integrity: Goal: Risk for impaired skin integrity will decrease Outcome: Progressing   Problem: Activity: Goal: Ability to tolerate increased activity will improve Outcome: Progressing   Problem: Clinical Measurements: Goal: Ability to maintain a body temperature in the normal range will improve Outcome: Progressing   Problem: Respiratory: Goal: Ability to maintain adequate ventilation will improve Outcome: Progressing Goal: Ability to maintain a clear airway will improve Outcome: Progressing   Problem: Education: Goal: Individualized Educational Video(s) Outcome: Not Applicable

## 2022-08-04 NOTE — Discharge Summary (Signed)
Physician Discharge Summary  Jeremiah Stevens [id number] DOB: 02-28-1976 DOA: 08/02/2022  PCP: Carlyle Basques, MD  Admit date: 08/02/2022 Discharge date: 08/04/2022  Time spent: 60 minutes  Recommendations for Outpatient Follow-up:  Follow-up with Dr.,, ID clinic 08/11/2022 for follow-up on HIV.  Follow-up on confirmatory testing on RPR at that time.  Follow-up on PJP smears.  Follow-up on pneumonia.  Patient will need a basic metabolic profile done to follow-up on electrolytes and renal function.  Patient need a CBC done as well. Follow-up with PCP in 2 weeks.  On follow-up patient's blood pressure as well as diabetes will need to be followed up upon.  Patient need a basic metabolic profile done to follow-up on electrolytes and renal function.   Discharge Diagnoses:  Principal Problem:   RLL pneumonia Active Problems:   AKI (acute kidney injury) ([facility])   Human immunodeficiency virus (HIV) disease   HTN (hypertension)   Uncontrolled type 2 diabetes mellitus with hyperglycemia, without long-term current use of insulin ([facility])   Tobacco abuse   Gender dysphoria   HIV infection ([facility])   Positive RPR test   Discharge Condition: Stable and improved  Diet recommendation: Heart healthy  Filed Weights   08/02/22 1750 08/04/22 0513  Weight: 118 kg 118.5 kg    History of present illness:  HPI per Dr. Bridgett Larsson 47 year old transgender male history of untreated HIV, untreated hypertension, untreated type 2 diabetes, presents to the ER today with a 4-day history of shortness of breath, malaise.  Has had chills for about 2 days.  Has had a cough but nonproductive.  No vomiting or diarrhea.   Patient has been off of HIV medications for over a year now.  She does not see a primary care physician.  She is not taking any medications at all for her diabetes, hypertension or HIV disease.   On arrival febrile to 103 Fahrenheit, heart rate 145, blood pressure 152/94.  Sats were 100% on room air.   Labs  showed a white count of 6.9, hemoglobin 12.3, platelets 142   Sodium 131, BUN of 16, creatinine 1.5, total protein 9, albumin 3.2 glucose of 146   COVID-negative, influenza negative, RSV negative   UA showed a specific gravity 1.034   CTPA demonstrated dense consolidation within the right lower lobe.  Negative for pulmonary embolism.   Tried hospitalist contacted for admission.     ED Course: CTPA negative for PE. Shows RLL pneumonia.  Hospital Course:  #1 right lower lobe pneumonia -Patient presented with respiratory symptoms, chest x-ray done unremarkable, CT chest done consistent with a right lower lobe pneumonia. -Patient with history of HIV, has not been on any antiviral therapies over the past year as he states unable to get to visits due to transportation issues and had run out of prescriptions. -PCP DFA smear pending. -Urine Legionella antigen negative, urine pneumococcus antigen negative. -Respiratory viral panel negative for SARS coronavirus 2 PCR, influenza A and B PCR negative. -Patient maintained on IV Rocephin and IV azithromycin during the hospitalization, improved clinically and will be transition to oral azithromycin and oral cefadroxil to complete a 5-day course of antibiotic treatment per ID recommendations.   -Patient maintained on Mucinex, DuoNebs, Claritin, Flonase, Pepcid during the hospitalization will be discharged on Mucinex, Combivent, Claritin, Flonase and Pepcid.   -Outpatient follow-up..   2.  Acute kidney injury -Likely secondary to prerenal azotemia in the setting of HCTZ. -HCTZ held and not resumed on discharge.   -Renal function improved with hydration.  3.  Tobacco abuse -Tobacco cessation. -Nicotine patch.   4.  Hypertension -HCTZ held during the hospitalization will not be resumed on discharge.   -Patient maintained on Norvasc and Lopressor.   -Outpatient follow-up.    5.  Uncontrolled type 2 diabetes with hyperglycemia -Hemoglobin A1c >  15.5 (10/02/2020) -CBG 197 this morning. -Hemoglobin A1c repeat was 7.3 08/02/2022.   -Patient maintained on SSI.   -Outpatient follow-up.   6.  HIV -Patient noted to have taking Biktarvy for in the past currently out of medications over the past year as he stated has not been able to follow-up with ID clinic due to transportation issues. -ID consulted, patient started on Biktarvy as well as Bactrim for PJP prophylaxis. -Viral load pending at time of discharge.  CD4 count noted at 131. -Outpatient follow-up with ID.  7.  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 -Confirmatory testing was pending at time of discharge. -Patient seen in consultation by ID who recommended doxycycline 100 mg twice daily x 4 weeks with close outpatient follow-up with ID 1 week postdischarge for further recommendations.   8.  Gender dysphoria -Trans male.  Identifies as a male.    Procedures: CT angiogram chest 08/02/2022 CT abdomen and pelvis 08/02/2022 Chest x-ray 08/02/2022  Consultations: Infectious disease: Dr. Candiss Norse 08/03/2022   Discharge Exam: Vitals:   08/04/22 1505 08/04/22 1626  BP:  (!) 139/92  Pulse:  99  Resp:  18  Temp:  98.2 F (36.8 C)  SpO2: 99% 98%    General: NAD Cardiovascular: Regular rate rhythm no murmurs rubs or gallops.  No JVD.  No lower extremity edema. Respiratory: Improved coarse breath sounds.  Minimal expiratory wheezing.  Discharge Instructions   Discharge Instructions     Diet - low sodium heart healthy   Complete by: As directed    Increase activity slowly   Complete by: As directed       Allergies as of 08/04/2022       Reactions   Penicillins Anaphylaxis   Peanut-containing Drug Products Other (See Comments)   WALNUTS - SORES ON TONGUE   Sustiva [efavirenz] Rash        Medication List     STOP taking these medications    hydrochlorothiazide 25 MG tablet Commonly known as: HYDRODIURIL   PARoxetine 10 MG tablet Commonly  known as: Paxil       TAKE these medications    amLODipine 10 MG tablet Commonly known as: NORVASC Take 1 tablet (10 mg total) by mouth daily.   azithromycin 250 MG tablet Commonly known as: ZITHROMAX Take 2 tablets (500 mg total) by mouth daily for 3 days. Start taking on: August 05, 2022   bictegravir-emtricitabine-tenofovir AF 50-200-25 MG Tabs tablet Commonly known as: BIKTARVY Take 1 tablet by mouth daily. Start taking on: August 05, 2022   cefadroxil 500 MG capsule Commonly known as: DURICEF Take 1 capsule (500 mg total) by mouth 2 (two) times daily for 4 days.   Combivent Respimat 20-100 MCG/ACT Aers respimat Generic drug: Ipratropium-Albuterol Inhale 1 puff into the lungs 3 (three) times daily for 7 days, THEN 1 puff every 6 (six) hours as needed for wheezing. Start taking on: August 04, 2022   doxycycline 100 MG tablet Commonly known as: VIBRA-TABS Take 1 tablet (100 mg total) by mouth every 12 (twelve) hours for 56 doses.   famotidine 20 MG tablet Commonly known as: PEPCID Take 1 tablet (20 mg total) by mouth daily. Start taking on: August 05, 2022   fluticasone 50 MCG/ACT nasal spray Commonly known as: FLONASE Place 2 sprays into both nostrils daily. Start taking on: August 05, 2022   guaiFENesin 600 MG 12 hr tablet Commonly known as: MUCINEX Take 2 tablets (1,200 mg total) by mouth 2 (two) times daily for 5 days.   loratadine 10 MG tablet Commonly known as: CLARITIN Take 1 tablet (10 mg total) by mouth daily. Start taking on: August 05, 2022   metoprolol tartrate 25 MG tablet Commonly known as: LOPRESSOR Take 1 tablet (25 mg total) by mouth 2 (two) times daily.   nicotine 21 mg/24hr patch Commonly known as: NICODERM CQ - dosed in mg/24 hours Place 1 patch (21 mg total) onto the skin daily. Start taking on: August 05, 2022   sulfamethoxazole-trimethoprim 800-160 MG tablet Commonly known as: BACTRIM DS Take 1 tablet by mouth  daily. Start taking on: August 05, 2022       Allergies  Allergen Reactions   Penicillins Anaphylaxis   Peanut-Containing Drug Products Other (See Comments)    WALNUTS - SORES ON TONGUE   Sustiva [Efavirenz] Rash    Follow-up Information     PCP. Schedule an appointment as soon as possible for a visit.   Why: f/u in 2 weeks.        Thayer Headings, MD Follow up on 08/11/2022.   Specialty: Infectious Diseases Why: f/u at 4pm Contact information: [address] [place] [address] [phone]                  The results of significant diagnostics from this hospitalization (including imaging, microbiology, ancillary and laboratory) are listed below for reference.    Significant Diagnostic Studies: CT Angio Chest PE W and/or Wo Contrast  Result Date: 08/02/2022 CLINICAL DATA:  Cough, acute nonlocalized abdominal pain, malaise. Pulmonary embolism, high probability. EXAM: CT ANGIOGRAPHY CHEST CT ABDOMEN AND PELVIS WITH CONTRAST TECHNIQUE: Multidetector CT imaging of the chest was performed using the standard protocol during bolus administration of intravenous contrast. Multiplanar CT image reconstructions and MIPs were obtained to evaluate the vascular anatomy. Multidetector CT imaging of the abdomen and pelvis was performed using the standard protocol during bolus administration of intravenous contrast. RADIATION DOSE REDUCTION: This exam was performed according to the departmental dose-optimization program which includes automated exposure control, adjustment of the mA and/or kV according to patient size and/or use of iterative reconstruction technique. CONTRAST:  75mL OMNIPAQUE IOHEXOL 350 MG/ML SOLN COMPARISON:  None Available. FINDINGS: CTA CHEST FINDINGS Cardiovascular: Imaging, particularly within the lung bases, is limited by respiratory motion artifact. There is adequate opacification of the pulmonary arterial tree. No intraluminal filling defect  identified to suggest acute pulmonary embolism through the segmental level. The central pulmonary arteries are of normal caliber. Mild coronary artery calcification. Mild global cardiomegaly. No pericardial effusion. The thoracic aorta is unremarkable. Mediastinum/Nodes: Shotty right hilar and subcarinal adenopathy may be reactive in nature. No frankly pathologic thoracic adenopathy. Visualized thyroid is unremarkable. Esophagus is unremarkable. Lungs/Pleura: There is dense consolidation within the posterior basal right lower lobe in keeping with changes of acute lobar pneumonia in the appropriate clinical setting. No pneumothorax or pleural effusion. Central airways are widely patent. Musculoskeletal: No acute bone abnormality. No lytic or blastic bone lesion. Review of the MIP images confirms the above findings. CT ABDOMEN and PELVIS FINDINGS Hepatobiliary: No focal liver abnormality is seen. No gallstones, gallbladder wall thickening, or biliary dilatation. Pancreas: Unremarkable. No pancreatic ductal dilatation or surrounding  inflammatory changes. Spleen: Normal in size without focal abnormality. Adrenals/Urinary Tract: Adrenal glands are unremarkable. Kidneys are normal, without renal calculi, focal lesion, or hydronephrosis. Bladder is unremarkable. Stomach/Bowel: Mild sigmoid diverticulosis without superimposed acute inflammatory change. Stomach, small bowel, and large bowel are otherwise unremarkable. No evidence of obstruction or focal inflammation. Appendix normal. No free intraperitoneal gas or fluid. Vascular/Lymphatic: No significant vascular findings are present. No enlarged abdominal or pelvic lymph nodes. Reproductive: Prostate is unremarkable. Other: No abdominal wall hernia Musculoskeletal: No acute bone abnormality. No lytic or blastic bone lesion. Review of the MIP images confirms the above findings. IMPRESSION: 1. No pulmonary embolism. 2. Dense consolidation within the posterior basal right  lower lobe in keeping with acute lobar pneumonia in the appropriate clinical setting. 3. Mild coronary artery calcification. Mild global cardiomegaly. 4. No acute intra-abdominal pathology identified. 5. Mild sigmoid diverticulosis without superimposed acute inflammatory change. Electronically Signed   By: Helyn Numbers M.D.   On: 08/02/2022 19:21   CT ABDOMEN PELVIS W CONTRAST  Result Date: 08/02/2022 CLINICAL DATA:  Cough, acute nonlocalized abdominal pain, malaise. Pulmonary embolism, high probability. EXAM: CT ANGIOGRAPHY CHEST CT ABDOMEN AND PELVIS WITH CONTRAST TECHNIQUE: Multidetector CT imaging of the chest was performed using the standard protocol during bolus administration of intravenous contrast. Multiplanar CT image reconstructions and MIPs were obtained to evaluate the vascular anatomy. Multidetector CT imaging of the abdomen and pelvis was performed using the standard protocol during bolus administration of intravenous contrast. RADIATION DOSE REDUCTION: This exam was performed according to the departmental dose-optimization program which includes automated exposure control, adjustment of the mA and/or kV according to patient size and/or use of iterative reconstruction technique. CONTRAST:  16mL OMNIPAQUE IOHEXOL 350 MG/ML SOLN COMPARISON:  None Available. FINDINGS: CTA CHEST FINDINGS Cardiovascular: Imaging, particularly within the lung bases, is limited by respiratory motion artifact. There is adequate opacification of the pulmonary arterial tree. No intraluminal filling defect identified to suggest acute pulmonary embolism through the segmental level. The central pulmonary arteries are of normal caliber. Mild coronary artery calcification. Mild global cardiomegaly. No pericardial effusion. The thoracic aorta is unremarkable. Mediastinum/Nodes: Shotty right hilar and subcarinal adenopathy may be reactive in nature. No frankly pathologic thoracic adenopathy. Visualized thyroid is unremarkable.  Esophagus is unremarkable. Lungs/Pleura: There is dense consolidation within the posterior basal right lower lobe in keeping with changes of acute lobar pneumonia in the appropriate clinical setting. No pneumothorax or pleural effusion. Central airways are widely patent. Musculoskeletal: No acute bone abnormality. No lytic or blastic bone lesion. Review of the MIP images confirms the above findings. CT ABDOMEN and PELVIS FINDINGS Hepatobiliary: No focal liver abnormality is seen. No gallstones, gallbladder wall thickening, or biliary dilatation. Pancreas: Unremarkable. No pancreatic ductal dilatation or surrounding inflammatory changes. Spleen: Normal in size without focal abnormality. Adrenals/Urinary Tract: Adrenal glands are unremarkable. Kidneys are normal, without renal calculi, focal lesion, or hydronephrosis. Bladder is unremarkable. Stomach/Bowel: Mild sigmoid diverticulosis without superimposed acute inflammatory change. Stomach, small bowel, and large bowel are otherwise unremarkable. No evidence of obstruction or focal inflammation. Appendix normal. No free intraperitoneal gas or fluid. Vascular/Lymphatic: No significant vascular findings are present. No enlarged abdominal or pelvic lymph nodes. Reproductive: Prostate is unremarkable. Other: No abdominal wall hernia Musculoskeletal: No acute bone abnormality. No lytic or blastic bone lesion. Review of the MIP images confirms the above findings. IMPRESSION: 1. No pulmonary embolism. 2. Dense consolidation within the posterior basal right lower lobe in keeping with acute lobar pneumonia in the appropriate clinical  setting. 3. Mild coronary artery calcification. Mild global cardiomegaly. 4. No acute intra-abdominal pathology identified. 5. Mild sigmoid diverticulosis without superimposed acute inflammatory change. Electronically Signed   By: Fidela Salisbury M.D.   On: 08/02/2022 19:21   DG Chest Portable 1 View  Result Date: 08/02/2022 CLINICAL DATA:   Shortness of breath. EXAM: PORTABLE CHEST 1 VIEW COMPARISON:  01/26/2016 FINDINGS: The heart size and mediastinal contours are within normal limits. Both lungs are clear. The visualized skeletal structures are unremarkable. IMPRESSION: No active disease. Electronically Signed   By: Kathreen Devoid M.D.   On: 08/02/2022 17:21    Microbiology: Recent Results (from the past 240 hour(s))  Resp panel by RT-PCR (RSV, Flu A&B, Covid) Anterior Nasal Swab     Status: None   Collection Time: 08/02/22  5:19 PM   Specimen: Anterior Nasal Swab  Result Value Ref Range Status   SARS Coronavirus 2 by RT PCR NEGATIVE NEGATIVE Final    Comment: (NOTE) SARS-CoV-2 target nucleic acids are NOT DETECTED.  The SARS-CoV-2 RNA is generally detectable in upper respiratory specimens during the acute phase of infection. The lowest concentration of SARS-CoV-2 viral copies this assay can detect is 138 copies/mL. A negative result does not preclude SARS-Cov-2 infection and should not be used as the sole basis for treatment or other patient management decisions. A negative result may occur with  improper specimen collection/handling, submission of specimen other than nasopharyngeal swab, presence of viral mutation(s) within the areas targeted by this assay, and inadequate number of viral copies(<138 copies/mL). A negative result must be combined with clinical observations, patient history, and epidemiological information. The expected result is Negative.  Fact Sheet for Patients:  EntrepreneurPulse.com.au  Fact Sheet for Healthcare Providers:  IncredibleEmployment.be  This test is no t yet approved or cleared by the [place] FDA and  has been authorized for detection and/or diagnosis of SARS-CoV-2 by FDA under an Emergency Use Authorization (EUA). This EUA will remain  in effect (meaning this test can be used) for the duration of the COVID-19 declaration under Section  564(b)(1) of the Act, 21 U.S.C.section 360bbb-3(b)(1), unless the authorization is terminated  or revoked sooner.       Influenza A by PCR NEGATIVE NEGATIVE Final   Influenza B by PCR NEGATIVE NEGATIVE Final    Comment: (NOTE) The Xpert Xpress SARS-CoV-2/FLU/RSV plus assay is intended as an aid in the diagnosis of influenza from Nasopharyngeal swab specimens and should not be used as a sole basis for treatment. Nasal washings and aspirates are unacceptable for Xpert Xpress SARS-CoV-2/FLU/RSV testing.  Fact Sheet for Patients: EntrepreneurPulse.com.au  Fact Sheet for Healthcare Providers: IncredibleEmployment.be  This test is not yet approved or cleared by the [place] FDA and has been authorized for detection and/or diagnosis of SARS-CoV-2 by FDA under an Emergency Use Authorization (EUA). This EUA will remain in effect (meaning this test can be used) for the duration of the COVID-19 declaration under Section 564(b)(1) of the Act, 21 U.S.C. section 360bbb-3(b)(1), unless the authorization is terminated or revoked.     Resp Syncytial Virus by PCR NEGATIVE NEGATIVE Final    Comment: (NOTE) Fact Sheet for Patients: EntrepreneurPulse.com.au  Fact Sheet for Healthcare Providers: IncredibleEmployment.be  This test is not yet approved or cleared by the [place] FDA and has been authorized for detection and/or diagnosis of SARS-CoV-2 by FDA under an Emergency Use Authorization (EUA). This EUA will remain in effect (meaning this test can be used) for the duration of the  COVID-19 declaration under Section 564(b)(1) of the Act, 21 U.S.C. section 360bbb-3(b)(1), unless the authorization is terminated or revoked.  Performed at [facility], [place] [address], [place], [place] [address]   Blood culture (routine x 2)     Status: None (Preliminary result)   Collection Time: 08/03/22  4:39  PM   Specimen: BLOOD LEFT ARM  Result Value Ref Range Status   Specimen Description   Final    BLOOD LEFT ARM Performed at [facility], [place] [address] [place], [place] [address]    Special Requests   Final    BOTTLES DRAWN AEROBIC AND ANAEROBIC Blood Culture adequate volume Performed at [facility] [address] [place], [place] [address]    Culture   Final    NO GROWTH < 12 HOURS Performed at [facility] [address] [place], [place] [address]    Report Status PENDING  Incomplete  Blood culture (routine x 2)     Status: None (Preliminary result)   Collection Time: 08/03/22  4:45 PM   Specimen: BLOOD RIGHT ARM  Result Value Ref Range Status   Specimen Description   Final    BLOOD RIGHT ARM Performed at [facility], [place] [address], [place], [place] [address]    Special Requests   Final    BOTTLES DRAWN AEROBIC AND ANAEROBIC Blood Culture adequate volume Performed at [facility] [address], [place], [place] [address]    Culture   Final    NO GROWTH < 12 HOURS Performed at [facility] [address], [place], [place] [address]    Report Status PENDING  Incomplete  Expectorated Sputum Assessment w Gram Stain, Rflx to Resp Cult     Status: None   Collection Time: 08/04/22  9:29 AM   Specimen: Expectorated Sputum  Result Value Ref Range Status   Specimen Description EXPECTORATED SPUTUM  Final   Special Requests NONE  Final   Sputum evaluation   Final    THIS SPECIMEN IS ACCEPTABLE FOR SPUTUM CULTURE Performed at [facility], [place] [address], [place],  [address]    Report Status 08/04/2022 FINAL  Final     Labs: Basic Metabolic Panel: Recent Labs  Lab 08/02/22 1719 08/03/22 0500 08/04/22 0525  NA 131* 134* 137  K 3.6 4.4 4.2  CL 101 105 108  CO2 23 24 24   GLUCOSE 146* 213* 156*  BUN 16 17 25*  CREATININE 1.53* 1.32* 1.19  CALCIUM 7.7* 7.6* 7.9*   Liver  Function Tests: Recent Labs  Lab 08/02/22 1719 08/03/22 0500  AST 31 29  ALT 28 27  ALKPHOS 43 38  BILITOT 0.6 0.9  PROT 9.0* 8.0  ALBUMIN 3.2* 2.6*   No results for input(s): "LIPASE", "AMYLASE" in the last 168 hours. No results for input(s): "AMMONIA" in the last 168 hours. CBC: Recent Labs  Lab 08/02/22 1719 08/03/22 0500 08/04/22 0525  WBC 6.9 4.5 3.3*  NEUTROABS 3.6 3.4 1.8  HGB 12.3* 12.4* 12.4*  HCT 37.5* 38.1* 37.9*  MCV 87.0 89.2 88.1  PLT 142* 141* 143*   Cardiac Enzymes: No results for input(s): "CKTOTAL", "CKMB", "CKMBINDEX", "TROPONINI" in the last 168 hours. BNP: BNP (last 3 results) No results for input(s): "BNP" in the last 8760 hours.  ProBNP (last 3 results) No results for input(s): "PROBNP" in the last 8760 hours.  CBG: Recent Labs  Lab 08/03/22 1720 08/03/22 2155  08/04/22 0745 08/04/22 1131 08/04/22 1623  GLUCAP 169* 215* 124* 124* 140*       Signed:  Irine Seal MD.  [facility] 08/04/2022, 5:24 PM

## 2022-08-04 NOTE — Progress Notes (Signed)
SATURATION QUALIFICATIONS: (This note is used to comply with regulatory documentation for home oxygen)  Patient Saturations on Room Air at Rest = 95%  Patient Saturations on Room Air while Ambulating = 89%  Please briefly explain why patient needs home oxygen: pt will not require home oxygen

## 2022-08-04 NOTE — Progress Notes (Signed)
Pt discharged home today per Dr. Thompson. Pt's IV site D/C'd and WDL. Pt's VSS. Pt provided with home medication list, discharge instructions and prescriptions. Verbalized understanding. Pt left floor via WC in stable condition accompanied by NT. 

## 2022-08-04 NOTE — Plan of Care (Signed)
Problem: Education: Goal: Ability to describe self-care measures that may prevent or decrease complications (Diabetes Survival Skills Education) will improve 08/04/2022 1740 by Annie Sable, RN Outcome: Completed/Met 08/04/2022 0934 by Annie Sable, RN Outcome: Progressing   Problem: Coping: Goal: Ability to adjust to condition or change in health will improve 08/04/2022 1740 by Annie Sable, RN Outcome: Completed/Met 08/04/2022 0934 by Annie Sable, RN Outcome: Progressing   Problem: Fluid Volume: Goal: Ability to maintain a balanced intake and output will improve 08/04/2022 1740 by Annie Sable, RN Outcome: Completed/Met 08/04/2022 0934 by Annie Sable, RN Outcome: Progressing   Problem: Health Behavior/Discharge Planning: Goal: Ability to identify and utilize available resources and services will improve 08/04/2022 1740 by Annie Sable, RN Outcome: Completed/Met 08/04/2022 0934 by Annie Sable, RN Outcome: Progressing Goal: Ability to manage health-related needs will improve 08/04/2022 1740 by Annie Sable, RN Outcome: Completed/Met 08/04/2022 0934 by Annie Sable, RN Outcome: Progressing   Problem: Metabolic: Goal: Ability to maintain appropriate glucose levels will improve 08/04/2022 1740 by Annie Sable, RN Outcome: Completed/Met 08/04/2022 0934 by Annie Sable, RN Outcome: Progressing   Problem: Nutritional: Goal: Maintenance of adequate nutrition will improve 08/04/2022 1740 by Annie Sable, RN Outcome: Completed/Met 08/04/2022 0934 by Annie Sable, RN Outcome: Progressing Goal: Progress toward achieving an optimal weight will improve 08/04/2022 1740 by Annie Sable, RN Outcome: Completed/Met 08/04/2022 0934 by Annie Sable, RN Outcome: Progressing   Problem: Skin Integrity: Goal: Risk for impaired skin integrity will decrease 08/04/2022 1740 by Annie Sable, RN Outcome: Completed/Met 08/04/2022 0934 by Annie Sable, RN Outcome: Progressing   Problem: Tissue Perfusion: Goal: Adequacy of tissue perfusion will improve 08/04/2022 1740 by Annie Sable, RN Outcome: Completed/Met 08/04/2022 0934 by Annie Sable, RN Outcome: Progressing   Problem: Activity: Goal: Ability to tolerate increased activity will improve 08/04/2022 1740 by Annie Sable, RN Outcome: Completed/Met 08/04/2022 0934 by Annie Sable, RN Outcome: Progressing   Problem: Clinical Measurements: Goal: Ability to maintain a body temperature in the normal range will improve 08/04/2022 1740 by Annie Sable, RN Outcome: Completed/Met 08/04/2022 0934 by Annie Sable, RN Outcome: Progressing   Problem: Respiratory: Goal: Ability to maintain adequate ventilation will improve 08/04/2022 1740 by Annie Sable, RN Outcome: Completed/Met 08/04/2022 0934 by Annie Sable, RN Outcome: Progressing Goal: Ability to maintain a clear airway will improve 08/04/2022 1740 by Annie Sable, RN Outcome: Completed/Met 08/04/2022 0934 by Annie Sable, RN Outcome: Progressing   Problem: Health Behavior/Discharge Planning: Goal: Ability to manage health-related needs will improve 08/04/2022 1740 by Annie Sable, RN Outcome: Completed/Met 08/04/2022 0934 by Annie Sable, RN Outcome: Progressing   Problem: Clinical Measurements: Goal: Ability to maintain clinical measurements within normal limits will improve 08/04/2022 1740 by Annie Sable, RN Outcome: Completed/Met 08/04/2022 0934 by Annie Sable, RN Outcome: Progressing Goal: Will remain free from infection 08/04/2022 1740 by Annie Sable, RN Outcome: Completed/Met 08/04/2022 0934 by Annie Sable, RN Outcome: Progressing Goal: Diagnostic test results will improve 08/04/2022 1740 by Annie Sable, RN Outcome: Completed/Met 08/04/2022 0934 by Annie Sable, RN Outcome: Progressing Goal: Respiratory complications will improve 08/04/2022 1740 by Annie Sable, RN Outcome: Completed/Met 08/04/2022 0934 by Annie Sable, RN Outcome: Progressing Goal: Cardiovascular complication will be avoided 08/04/2022 1740 by Annie Sable, RN Outcome: Completed/Met 08/04/2022 0934 by Annie Sable, RN  Outcome: Progressing   Problem: Activity: Goal: Risk for activity intolerance will decrease 08/04/2022 1740 by Annie Sable, RN Outcome: Completed/Met 08/04/2022 0934 by Annie Sable, RN Outcome: Progressing   Problem: Nutrition: Goal: Adequate nutrition will be maintained 08/04/2022 1740 by Annie Sable, RN Outcome: Completed/Met 08/04/2022 0934 by Annie Sable, RN Outcome: Progressing   Problem: Coping: Goal: Level of anxiety will decrease 08/04/2022 1740 by Annie Sable, RN Outcome: Completed/Met 08/04/2022 0934 by Annie Sable, RN Outcome: Progressing   Problem: Elimination: Goal: Will not experience complications related to bowel motility 08/04/2022 1740 by Annie Sable, RN Outcome: Completed/Met 08/04/2022 0934 by Annie Sable, RN Outcome: Progressing Goal: Will not experience complications related to urinary retention 08/04/2022 1740 by Annie Sable, RN Outcome: Completed/Met 08/04/2022 0934 by Annie Sable, RN Outcome: Progressing   Problem: Pain Managment: Goal: General experience of comfort will improve 08/04/2022 1740 by Annie Sable, RN Outcome: Completed/Met 08/04/2022 0934 by Annie Sable, RN Outcome: Progressing   Problem: Safety: Goal: Ability to remain free from injury will improve 08/04/2022 1740 by Annie Sable, RN Outcome: Completed/Met 08/04/2022 0934 by Annie Sable, RN Outcome: Progressing   Problem: Skin Integrity: Goal: Risk for impaired skin integrity will decrease 08/04/2022 1740 by Annie Sable, RN Outcome: Completed/Met 08/04/2022 0934 by Annie Sable, RN Outcome: Progressing   Problem: Activity: Goal: Ability to tolerate increased activity will  improve 08/04/2022 1740 by Annie Sable, RN Outcome: Completed/Met 08/04/2022 0934 by Annie Sable, RN Outcome: Progressing   Problem: Clinical Measurements: Goal: Ability to maintain a body temperature in the normal range will improve 08/04/2022 1740 by Annie Sable, RN Outcome: Completed/Met 08/04/2022 0934 by Annie Sable, RN Outcome: Progressing   Problem: Respiratory: Goal: Ability to maintain adequate ventilation will improve 08/04/2022 1740 by Annie Sable, RN Outcome: Completed/Met 08/04/2022 0934 by Annie Sable, RN Outcome: Progressing Goal: Ability to maintain a clear airway will improve 08/04/2022 1740 by Annie Sable, RN Outcome: Completed/Met 08/04/2022 0934 by Annie Sable, RN Outcome: Progressing   Problem: Education: Goal: Individualized Educational Video(s) Outcome: Not Applicable

## 2022-08-04 NOTE — Progress Notes (Addendum)
[facility] for Infectious Disease  Date of Admission:  08/02/2022   Total days of inpatient antibiotics 2  Principal Problem:   RLL pneumonia Active Problems:   Human immunodeficiency virus (HIV) disease   HTN (hypertension)   Gender dysphoria   AKI (acute kidney injury) ([place])   Uncontrolled type 2 diabetes mellitus with hyperglycemia, without long-term current use of insulin ([facility])   Tobacco abuse   HIV infection ([place])          Assessment: 17 year transgender male(M->F) admitted with RLL pneumonia, ID engaged for c/f PJP pneumonia   #HIV/AIDS #Medication non-adherence #CAP #Polysubstance abuse(smoked meth 2 weeks ago)- Denies IVDA #Uncontrolled DM A1c >15.5 -RSV/Flu/COVID swab negative -LDH 274, CD4 131 -CT showed dense consolidation in posterior right lower lobe c/w acute lobar PNA, mild sigmoid diverticulosis w/o superimposed acute inflammatory change -Although LDH is elevated but right lobar PNA is an unusual presentation. Generally, onset of symptoms is about 2 weeks and pt presented with 3 days of fever. He denies a cough. Although CD4 is low, this could be in the setting of acute illness. Regardless, pt needs to get back in care for HIV. Pt states that she has not been taking ART for the last year because she thinks one of her meds made her "tendon snap". I counseled pt that [facility] is unlikely to have that as a side effect profile. 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 Today pt was resting in bed on room air w//o [facility].  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  -ID F/U with Dr. Linus Salmons on 1/30 for HIV F/U and possible 2nd dose of PCN. Dr/ Snider's clinic was full.   Microbiology:   Antibiotics: Ceftriaxone and azithormycin   Cultures: Blood 1/21  SUBJECTIVE: Resting in bed on room air.  Interval: Afebrile overnight.  Review of Systems: Review of Systems  All other systems reviewed and are negative.    Scheduled Meds:  amLODipine  10 mg Oral Daily   azithromycin  500 mg Oral Q2000   bictegravir-emtricitabine-tenofovir AF  1 tablet Oral Daily   famotidine  20 mg Oral Daily   fluticasone  2 spray Each Nare Daily   guaiFENesin  1,200 mg Oral BID   heparin  5,000 Units Subcutaneous Q8H   insulin aspart  0-15 Units Subcutaneous TID WC   insulin aspart  0-5 Units Subcutaneous QHS   ipratropium-albuterol  3 mL Nebulization TID   loratadine  10 mg Oral Daily   metoprolol tartrate  12.5 mg Oral BID   nicotine  21 mg Transdermal Daily   sulfamethoxazole-trimethoprim  1 tablet Oral Daily   Continuous Infusions:  sodium chloride 125 mL/hr at 08/04/22 1425   cefTRIAXone (ROCEPHIN)  IV Stopped (08/03/22 2108)   PRN Meds:.acetaminophen **OR** acetaminophen, ipratropium-albuterol, melatonin, ondansetron **OR** ondansetron (ZOFRAN) IV Allergies  Allergen Reactions   Penicillins Anaphylaxis   Peanut-Containing Drug Products Other (See Comments)    WALNUTS - SORES ON TONGUE  Sustiva [Efavirenz] Rash    OBJECTIVE: Vitals:   08/04/22 0947 08/04/22 0950 08/04/22 1135 08/04/22 1505  BP:      Pulse:      Resp:      Temp:      TempSrc:      SpO2: 100% 100% 95% 99%  Weight:      Height:       Body mass index is 35.43 kg/m.  Physical Exam Constitutional:      General: She is not in acute distress.    Appearance: She is normal weight. She is not toxic-appearing.  HENT:     Head: Normocephalic and atraumatic.     Right Ear:  External ear normal.     Left Ear: External ear normal.     Nose: No congestion or rhinorrhea.     Mouth/Throat:     Mouth: Mucous membranes are moist.     Pharynx: Oropharynx is clear.  Eyes:     Extraocular Movements: Extraocular movements intact.     Conjunctiva/sclera: Conjunctivae normal.     Pupils: Pupils are equal, round, and reactive to light.  Cardiovascular:     Rate and Rhythm: Normal rate and regular rhythm.     Heart sounds: No murmur heard.    No friction rub. No gallop.  Pulmonary:     Effort: Pulmonary effort is normal.     Breath sounds: Normal breath sounds.  Abdominal:     General: Abdomen is flat. Bowel sounds are normal.     Palpations: Abdomen is soft.  Musculoskeletal:        General: No swelling. Normal range of motion.     Cervical back: Normal range of motion and neck supple.  Skin:    General: Skin is warm and dry.  Neurological:     General: No focal deficit present.     Mental Status: She is oriented to person, place, and time.  Psychiatric:        Mood and Affect: Mood normal.       Lab Results Lab Results  Component Value Date   WBC 3.3 (L) 08/04/2022   HGB 12.4 (L) 08/04/2022   HCT 37.9 (L) 08/04/2022   MCV 88.1 08/04/2022   PLT 143 (L) 08/04/2022    Lab Results  Component Value Date   CREATININE 1.19 08/04/2022   BUN 25 (H) 08/04/2022   NA 137 08/04/2022   K 4.2 08/04/2022   CL 108 08/04/2022   CO2 24 08/04/2022    Lab Results  Component Value Date   ALT 27 08/03/2022   AST 29 08/03/2022   ALKPHOS 38 08/03/2022   BILITOT 0.9 08/03/2022      Laurice Record, [facility] 08/04/2022, 4:14 PM

## 2022-08-04 NOTE — Care Plan (Signed)
Was called by microbiology that 1/4 blood culture bottles with gram-positive cocci in clusters with staph species. Patient just discharged was afebrile, improved clinically, with sats of 98% on room air. Spoke with ID doctor on-call, Dr.Comer who reviewed chart and felt likely contaminant and nothing to do at this time but to monitor.  Will ask ID on-call doctor tomorrow to follow-up on culture results.  No charge.

## 2022-08-05 ENCOUNTER — Other Ambulatory Visit (HOSPITAL_COMMUNITY): Payer: Self-pay

## 2022-08-05 LAB — T.PALLIDUM AB, TOTAL: T Pallidum Abs: NONREACTIVE

## 2022-08-05 LAB — CULTURE, BLOOD (ROUTINE X 2): Special Requests: ADEQUATE

## 2022-08-05 LAB — MISC LABCORP TEST (SEND OUT): Labcorp test code: 832599

## 2022-08-05 LAB — HIV-1 RNA QUANT-NO REFLEX-BLD
HIV 1 RNA Quant: 677000 copies/mL
LOG10 HIV-1 RNA: 5.831 log10copy/mL

## 2022-08-06 LAB — PNEUMOCYSTIS JIROVECI SMEAR BY DFA: Pneumocystis jiroveci Ag: NEGATIVE

## 2022-08-06 LAB — CULTURE, RESPIRATORY W GRAM STAIN

## 2022-08-08 LAB — CULTURE, BLOOD (ROUTINE X 2)
Culture: NO GROWTH
Special Requests: ADEQUATE

## 2022-08-11 ENCOUNTER — Inpatient Hospital Stay: Payer: Commercial Managed Care - HMO | Admitting: Internal Medicine

## 2022-08-11 ENCOUNTER — Telehealth: Payer: Self-pay

## 2022-08-11 LAB — MISC LABCORP TEST (SEND OUT): Labcorp test code: 9985

## 2022-08-11 NOTE — Telephone Encounter (Signed)
Called patient to see if she would be able to make it to today's appointment, no answer. Left HIPAA compliant voicemail requesting callback.   Briannia Laba D Macyn Shropshire, RN  

## 2022-08-17 ENCOUNTER — Inpatient Hospital Stay: Payer: Self-pay | Admitting: Internal Medicine

## 2022-08-18 ENCOUNTER — Other Ambulatory Visit (HOSPITAL_COMMUNITY): Payer: Self-pay

## 2023-08-25 ENCOUNTER — Emergency Department (HOSPITAL_COMMUNITY): Payer: Medicaid Other

## 2023-08-25 ENCOUNTER — Encounter (HOSPITAL_COMMUNITY): Payer: Self-pay | Admitting: Emergency Medicine

## 2023-08-25 ENCOUNTER — Inpatient Hospital Stay (HOSPITAL_COMMUNITY)
Admission: EM | Admit: 2023-08-25 | Discharge: 2023-09-02 | DRG: 974 | Disposition: A | Discharge status: Home / Self Care | Payer: Medicaid Other | Attending: Internal Medicine | Admitting: Internal Medicine

## 2023-08-25 DIAGNOSIS — E7220 Disorder of urea cycle metabolism, unspecified: Secondary | ICD-10-CM

## 2023-08-25 DIAGNOSIS — N179 Acute kidney failure, unspecified: Secondary | ICD-10-CM

## 2023-08-25 DIAGNOSIS — R569 Unspecified convulsions: Secondary | ICD-10-CM

## 2023-08-25 DIAGNOSIS — R401 Stupor: Secondary | ICD-10-CM

## 2023-08-25 DIAGNOSIS — B459 Cryptococcosis, unspecified: Secondary | ICD-10-CM | POA: Diagnosis present

## 2023-08-25 DIAGNOSIS — G9340 Encephalopathy, unspecified: Secondary | ICD-10-CM

## 2023-08-25 DIAGNOSIS — R4182 Altered mental status, unspecified: Secondary | ICD-10-CM

## 2023-08-25 DIAGNOSIS — E1100 Type 2 diabetes mellitus with hyperosmolarity without nonketotic hyperglycemic-hyperosmolar coma (NKHHC): Principal | ICD-10-CM | POA: Diagnosis present

## 2023-08-25 DIAGNOSIS — J9601 Acute respiratory failure with hypoxia: Secondary | ICD-10-CM

## 2023-08-25 DIAGNOSIS — D61818 Other pancytopenia: Secondary | ICD-10-CM

## 2023-08-25 DIAGNOSIS — R739 Hyperglycemia, unspecified: Secondary | ICD-10-CM | POA: Diagnosis present

## 2023-08-25 DIAGNOSIS — E66813 Obesity, class 3: Secondary | ICD-10-CM | POA: Diagnosis present

## 2023-08-25 DIAGNOSIS — E874 Mixed disorder of acid-base balance: Secondary | ICD-10-CM | POA: Diagnosis present

## 2023-08-25 DIAGNOSIS — F1410 Cocaine abuse, uncomplicated: Secondary | ICD-10-CM | POA: Diagnosis present

## 2023-08-25 DIAGNOSIS — G4089 Other seizures: Secondary | ICD-10-CM | POA: Diagnosis present

## 2023-08-25 DIAGNOSIS — Z79899 Other long term (current) drug therapy: Secondary | ICD-10-CM | POA: Diagnosis not present

## 2023-08-25 DIAGNOSIS — Z1152 Encounter for screening for COVID-19: Secondary | ICD-10-CM | POA: Diagnosis not present

## 2023-08-25 DIAGNOSIS — E119 Type 2 diabetes mellitus without complications: Secondary | ICD-10-CM | POA: Diagnosis not present

## 2023-08-25 DIAGNOSIS — F1510 Other stimulant abuse, uncomplicated: Secondary | ICD-10-CM | POA: Diagnosis present

## 2023-08-25 DIAGNOSIS — I2510 Atherosclerotic heart disease of native coronary artery without angina pectoris: Secondary | ICD-10-CM | POA: Diagnosis present

## 2023-08-25 DIAGNOSIS — B20 Human immunodeficiency virus [HIV] disease: Secondary | ICD-10-CM | POA: Diagnosis present

## 2023-08-25 DIAGNOSIS — Z91148 Patient's other noncompliance with medication regimen for other reason: Secondary | ICD-10-CM | POA: Diagnosis not present

## 2023-08-25 DIAGNOSIS — F640 Transsexualism: Secondary | ICD-10-CM | POA: Diagnosis present

## 2023-08-25 DIAGNOSIS — G9341 Metabolic encephalopathy: Secondary | ICD-10-CM | POA: Diagnosis present

## 2023-08-25 DIAGNOSIS — Z59819 Housing instability, housed unspecified: Secondary | ICD-10-CM | POA: Diagnosis not present

## 2023-08-25 DIAGNOSIS — Z6841 Body Mass Index (BMI) 40.0 and over, adult: Secondary | ICD-10-CM | POA: Diagnosis not present

## 2023-08-25 DIAGNOSIS — I10 Essential (primary) hypertension: Secondary | ICD-10-CM | POA: Diagnosis present

## 2023-08-25 DIAGNOSIS — Z794 Long term (current) use of insulin: Secondary | ICD-10-CM | POA: Diagnosis not present

## 2023-08-25 DIAGNOSIS — Z8572 Personal history of non-Hodgkin lymphomas: Secondary | ICD-10-CM | POA: Diagnosis not present

## 2023-08-25 HISTORY — DX: Malignant (primary) neoplasm, unspecified: C80.1

## 2023-08-25 HISTORY — DX: Essential (primary) hypertension: I10

## 2023-08-25 HISTORY — DX: Type 2 diabetes mellitus without complications: E11.9

## 2023-08-25 HISTORY — DX: Asymptomatic human immunodeficiency virus (hiv) infection status: Z21

## 2023-08-25 LAB — CBC WITH DIFFERENTIAL/PLATELET
Abs Immature Granulocytes: 0.04 10*3/uL (ref 0.00–0.07)
Basophils Absolute: 0 10*3/uL (ref 0.0–0.1)
Basophils Relative: 0 %
Eosinophils Absolute: 0.1 10*3/uL (ref 0.0–0.5)
Eosinophils Relative: 2 %
HCT: 39.4 % (ref 39.0–52.0)
Hemoglobin: 13.5 g/dL (ref 13.0–17.0)
Immature Granulocytes: 1 %
Lymphocytes Relative: 12 %
Lymphs Abs: 0.4 10*3/uL — ABNORMAL LOW (ref 0.7–4.0)
MCH: 29.5 pg (ref 26.0–34.0)
MCHC: 34.3 g/dL (ref 30.0–36.0)
MCV: 86 fL (ref 80.0–100.0)
Monocytes Absolute: 0.3 10*3/uL (ref 0.1–1.0)
Monocytes Relative: 8 %
Neutro Abs: 2.7 10*3/uL (ref 1.7–7.7)
Neutrophils Relative %: 77 %
Platelets: 166 10*3/uL (ref 150–400)
RBC: 4.58 MIL/uL (ref 4.22–5.81)
RDW: 13.4 % (ref 11.5–15.5)
WBC: 3.5 10*3/uL — ABNORMAL LOW (ref 4.0–10.5)
nRBC: 0 % (ref 0.0–0.2)

## 2023-08-25 LAB — BASIC METABOLIC PANEL
Anion gap: 20 — ABNORMAL HIGH (ref 5–15)
BUN: 23 mg/dL — ABNORMAL HIGH (ref 6–20)
CO2: 19 mmol/L — ABNORMAL LOW (ref 22–32)
Calcium: 9.3 mg/dL (ref 8.9–10.3)
Chloride: 93 mmol/L — ABNORMAL LOW (ref 98–111)
Creatinine, Ser: 1.79 mg/dL — ABNORMAL HIGH (ref 0.61–1.24)
GFR, Estimated: 47 mL/min — ABNORMAL LOW (ref >60)
Glucose, Bld: 690 mg/dL (ref 70–99)
Potassium: 4.1 mmol/L (ref 3.5–5.1)
Sodium: 132 mmol/L — ABNORMAL LOW (ref 135–145)

## 2023-08-25 LAB — COMPREHENSIVE METABOLIC PANEL
ALT: 26 U/L (ref 0–44)
AST: 31 U/L (ref 15–41)
Albumin: 3.1 g/dL — ABNORMAL LOW (ref 3.5–5.0)
Alkaline Phosphatase: 91 U/L (ref 38–126)
Anion gap: 19 — ABNORMAL HIGH (ref 5–15)
BUN: 20 mg/dL (ref 6–20)
CO2: 20 mmol/L — ABNORMAL LOW (ref 22–32)
Calcium: 9.4 mg/dL (ref 8.9–10.3)
Chloride: 93 mmol/L — ABNORMAL LOW (ref 98–111)
Creatinine, Ser: 1.79 mg/dL — ABNORMAL HIGH (ref 0.61–1.24)
GFR, Estimated: 47 mL/min — ABNORMAL LOW (ref >60)
Glucose, Bld: 686 mg/dL (ref 70–99)
Potassium: 4.5 mmol/L (ref 3.5–5.1)
Sodium: 132 mmol/L — ABNORMAL LOW (ref 135–145)
Total Bilirubin: 0.6 mg/dL (ref 0.0–1.2)
Total Protein: 9.1 g/dL — ABNORMAL HIGH (ref 6.5–8.1)

## 2023-08-25 LAB — URINALYSIS, ROUTINE W REFLEX MICROSCOPIC
Bacteria, UA: NONE SEEN
Bilirubin Urine: NEGATIVE
Glucose, UA: >=500 mg/dL — AB
Ketones, ur: NEGATIVE mg/dL
Leukocytes,Ua: NEGATIVE
Nitrite: NEGATIVE
Protein, ur: NEGATIVE mg/dL
Specific Gravity, Urine: 1.027 (ref 1.005–1.030)
pH: 5 (ref 5.0–8.0)

## 2023-08-25 LAB — BLOOD GAS, VENOUS
Acid-base deficit: 8.1 mmol/L — ABNORMAL HIGH (ref 0.0–2.0)
Bicarbonate: 21.6 mmol/L (ref 20.0–28.0)
O2 Saturation: 70.8 %
Patient temperature: 37
pCO2, Ven: 62 mm[Hg] — ABNORMAL HIGH (ref 44–60)
pH, Ven: 7.15 — CL (ref 7.25–7.43)
pO2, Ven: 49 mm[Hg] — ABNORMAL HIGH (ref 32–45)

## 2023-08-25 LAB — BETA-HYDROXYBUTYRIC ACID: Beta-Hydroxybutyric Acid: 0.27 mmol/L (ref 0.05–0.27)

## 2023-08-25 LAB — RAPID URINE DRUG SCREEN, HOSP PERFORMED
Amphetamines: POSITIVE — AB
Barbiturates: NOT DETECTED
Benzodiazepines: POSITIVE — AB
Cocaine: NOT DETECTED
Opiates: NOT DETECTED
Tetrahydrocannabinol: NOT DETECTED

## 2023-08-25 LAB — CBG MONITORING, ED
Glucose-Capillary: >600 mg/dL (ref 70–99)
Glucose-Capillary: 281 mg/dL — ABNORMAL HIGH (ref 70–99)
Glucose-Capillary: 364 mg/dL — ABNORMAL HIGH (ref 70–99)

## 2023-08-25 LAB — GLUCOSE, CAPILLARY
Glucose-Capillary: 161 mg/dL — ABNORMAL HIGH (ref 70–99)
Glucose-Capillary: 186 mg/dL — ABNORMAL HIGH (ref 70–99)

## 2023-08-25 LAB — ETHANOL: Alcohol, Ethyl (B): <10 mg/dL (ref <10)

## 2023-08-25 LAB — RESP PANEL BY RT-PCR (RSV, FLU A&B, COVID)  RVPGX2
Influenza A by PCR: NEGATIVE
Influenza B by PCR: NEGATIVE
Resp Syncytial Virus by PCR: NEGATIVE
SARS Coronavirus 2 by RT PCR: NEGATIVE

## 2023-08-25 LAB — OSMOLALITY: Osmolality: 345 mosm/kg (ref 275–295)

## 2023-08-25 MED ORDER — INSULIN REGULAR(HUMAN) IN NACL 100-0.9 UT/100ML-% IV SOLN
INTRAVENOUS | Status: DC
  Administered 2023-08-25: 13 [IU]/h via INTRAVENOUS
  Filled 2023-08-25: qty 100

## 2023-08-25 MED ORDER — SODIUM CHLORIDE 0.9 % IV BOLUS
1000.0000 mL | Freq: Once | INTRAVENOUS | Status: AC
  Administered 2023-08-25: 1000 mL via INTRAVENOUS

## 2023-08-25 MED ORDER — POTASSIUM CHLORIDE 10 MEQ/100ML IV SOLN
10.0000 meq | INTRAVENOUS | Status: AC
  Administered 2023-08-25 (×2): 10 meq via INTRAVENOUS
  Filled 2023-08-25: qty 100

## 2023-08-25 MED ORDER — SODIUM CHLORIDE 0.9 % IV BOLUS: 1000.0000 mL | Freq: Once | INTRAVENOUS | Status: DC

## 2023-08-25 MED ORDER — SODIUM CHLORIDE 0.9 % IV BOLUS
2000.0000 mL | Freq: Once | INTRAVENOUS | Status: AC
  Administered 2023-08-25: 2000 mL via INTRAVENOUS

## 2023-08-25 MED ORDER — DEXTROSE 50 % IV SOLN: 0.0000 mL | INTRAVENOUS | Status: DC | PRN

## 2023-08-25 MED ORDER — SODIUM CHLORIDE 0.9 % IV SOLN
4500.0000 mg | Freq: Once | INTRAVENOUS | Status: AC
  Administered 2023-08-25: 4500 mg via INTRAVENOUS
  Filled 2023-08-25: qty 45

## 2023-08-25 MED ORDER — LEVETIRACETAM IN NACL 1000 MG/100ML IV SOLN
1000.0000 mg | Freq: Two times a day (BID) | INTRAVENOUS | Status: DC
  Administered 2023-08-26 – 2023-08-28 (×5): 1000 mg via INTRAVENOUS
  Filled 2023-08-25 (×6): qty 100

## 2023-08-25 MED ORDER — HEPARIN SODIUM (PORCINE) 5000 UNIT/ML IJ SOLN
5000.0000 [IU] | Freq: Three times a day (TID) | INTRAMUSCULAR | Status: DC
  Administered 2023-08-25 – 2023-08-29 (×11): 5000 [IU] via SUBCUTANEOUS
  Filled 2023-08-25 (×10): qty 1

## 2023-08-25 MED ORDER — LORAZEPAM 2 MG/ML IJ SOLN
2.0000 mg | Freq: Once | INTRAMUSCULAR | Status: AC
  Administered 2023-08-25: 2 mg via INTRAVENOUS
  Filled 2023-08-25: qty 1

## 2023-08-25 MED ORDER — DEXTROSE IN LACTATED RINGERS 5 % IV SOLN: INTRAVENOUS | Status: DC

## 2023-08-25 MED ORDER — LORAZEPAM 2 MG/ML IJ SOLN
INTRAMUSCULAR | Status: AC
  Filled 2023-08-25: qty 1

## 2023-08-25 MED ORDER — DOCUSATE SODIUM 100 MG PO CAPS: 100.0000 mg | ORAL_CAPSULE | Freq: Two times a day (BID) | ORAL | Status: DC | PRN

## 2023-08-25 MED ORDER — LACTATED RINGERS IV SOLN: INTRAVENOUS | Status: AC

## 2023-08-25 MED ORDER — POLYETHYLENE GLYCOL 3350 17 G PO PACK: 17.0000 g | PACK | Freq: Every day | ORAL | Status: DC | PRN

## 2023-08-25 NOTE — ED Provider Notes (Signed)
 I was called to the room at 19:20, patient found on the floor unresponsive.   He was incontinent of urine. No wound or obvious injury from fall. Sonorous breathing. He was lifted to the bed. Responds to name being called, but no coherent speech. Potentially post-ictal given presentation to the ED. No witnessed or active seizure activity.   Head CT pending. Patient back on monitor. CBG pending - blood glucose was >600, insulin drip just getting started.   Dr. Eloise Harman made aware.    Elpidio Anis, PA-C 08/25/23 Ella Jubilee, MD 08/30/23 [phone]

## 2023-08-25 NOTE — ED Provider Notes (Signed)
 [place] EMERGENCY DEPARTMENT AT [facility] Provider Note   CSN: [id number] Arrival date & time: 08/25/23  1720     History {Add pertinent medical, surgical, social history, OB history to HPI:1} Chief Complaint  Patient presents with   Seizures    Jeremiah Stevens is a 48 y.o. male.  48 year old male to male transgender patient with a history of HIV and seizures who presents emergency department with altered mental status and seizure.  History obtained per EMS he reports that family saw the patient have a seizure and called 911.  En route EMS reported another seizure lasting approximately 1 minute.  Patient was given 5 of Versed and stopped seizing.  Was reportedly very agitated initially.  EMS says that the patient is noncompliant with his medications.       Home Medications Prior to Admission medications   Not on File      Allergies    Patient has no allergy information on record.    Review of Systems   Review of Systems  Physical Exam Updated Vital Signs BP 128/80   Pulse (!) 120   Temp 98.2 F (36.8 C) (Oral)   Resp (!) 25   SpO2 97%  Physical Exam Vitals and nursing note reviewed.  Constitutional:      General: He is not in acute distress.    Appearance: He is well-developed.     Comments: Patient will open eyes to voice.  Nonconversant.  Very drowsy appearing.  HENT:     Head: Normocephalic and atraumatic.     Right Ear: External ear normal.     Left Ear: External ear normal.     Nose: Nose normal.  Eyes:     Extraocular Movements: Extraocular movements intact.     Conjunctiva/sclera: Conjunctivae normal.     Pupils: Pupils are equal, round, and reactive to light.  Cardiovascular:     Rate and Rhythm: Regular rhythm. Tachycardia present.     Heart sounds: Normal heart sounds.  Pulmonary:     Effort: Pulmonary effort is normal. No respiratory distress.     Breath sounds: Normal breath sounds.     Comments: On 2 L nasal  cannula Musculoskeletal:     Cervical back: Normal range of motion and neck supple.     Right lower leg: No edema.     Left lower leg: No edema.  Skin:    General: Skin is warm and dry.  Neurological:     Mental Status: He is alert.     Comments: Opens eyes to voice.  Follows commands.  Moves all 4 extremities.  Pupils 4 mm bilaterally.  Patient too drowsy to follow remainder of neurologic exam.  Psychiatric:        Mood and Affect: Mood normal.        Behavior: Behavior normal.     ED Results / Procedures / Treatments   Labs (all labs ordered are listed, but only abnormal results are displayed) Labs Reviewed  CBC WITH DIFFERENTIAL/PLATELET - Abnormal; Notable for the following components:      Result Value   WBC 3.5 (*)    Lymphs Abs 0.4 (*)    All other components within normal limits  RESP PANEL BY RT-PCR (RSV, FLU A&B, COVID)  RVPGX2  ETHANOL  COMPREHENSIVE METABOLIC PANEL  URINALYSIS, ROUTINE W REFLEX MICROSCOPIC  RAPID URINE DRUG SCREEN, HOSP PERFORMED  CBG MONITORING, ED    EKG None  Radiology No results found.  Procedures Procedures  {  Document cardiac monitor, telemetry assessment procedure when appropriate:1}  Medications Ordered in ED Medications - No data to display  ED Course/ Medical Decision Making/ A&P   {   Click here for ABCD2, HEART and other calculatorsREFRESH Note before signing :1}                              Medical Decision Making Amount and/or Complexity of Data Reviewed Labs: ordered. Radiology: ordered.   ***  {Document critical care time when appropriate:1} {Document review of labs and clinical decision tools ie heart score, Chads2Vasc2 etc:1}  {Document your independent review of radiology images, and any outside records:1} {Document your discussion with family members, caretakers, and with consultants:1} {Document social determinants of health affecting pt's care:1} {Document your decision making why or why not admission,  treatments were needed:1} Final Clinical Impression(s) / ED Diagnoses Final diagnoses:  None    Rx / DC Orders ED Discharge Orders     None

## 2023-08-25 NOTE — Progress Notes (Signed)
 eLink Physician-Brief Progress Note Patient Name: Jeremiah Stevens DOB: Feb 09, 1976 MRN: [id number]   Date of Service  08/25/2023  HPI/Events of Note  48 y.o. male who has a PMH including but not limited to DM2 and HIV presented with acute encephalopathy likely secondary to HHS and seizures baseline medication nonadherence  On examination he has normal vitals.  Saturating 95% on the low flow nasal cannula.  He has been started on insulin infusion, Keppra, and crystalloids.  Results are consistent with mixed respiratory and metabolic acidosis, hyperglycemia, kidney injury, and urinalysis consistent with hemoglobinuria.  UDS positive for benzos and amphetamines.  CT head unremarkable.  eICU Interventions  Will maintain Keppra scheduled  HHS protocol in place with IVF, insulin, and electrolyte repletion.  DVT prophylaxis with heparin GI prophylaxis not indicated   0549 -had improvement in his mentation and initially felt that he would be able to still lay still long enough to obtain MRI, was fidgeting during MRI.  Seems like mentation is improving and MRI can be attempted again later today if still requested by Neuro.  Will try to avoid medicating and obscuring the neurological exam for an MRI.  Intervention Category Evaluation Type: New Patient Evaluation  Jeremiah Stevens 08/25/2023, 11:05 PM

## 2023-08-25 NOTE — Consult Note (Signed)
 NEUROLOGY CONSULT NOTE   Date of service: August 31, 2023 Patient Name: Jeremiah Stevens MRN:  [id number] DOB:  Sep 22, 1975 Chief Complaint: "seizures" Requesting Provider: Patrici Ranks, MD  History of Present Illness  Jeremiah Stevens is a 48 y.o. male with hx of HIV, diabetes, who presents with new onset seizures.  He was in his normal state of health earlier, but then began to have some confusion and subsequently had a seizure.  He had another seizure with EMS but recovered and was speaking relatively normally in the emergency department.  He had a third seizure in the emergency department was given Ativan and has been slightly confused since that time.    Medications   Current Facility-Administered Medications:    dextrose 5 % in lactated ringers infusion, , Intravenous, Continuous, Rondel Baton, MD   dextrose 50 % solution 0-50 mL, 0-50 mL, Intravenous, PRN, Rondel Baton, MD   docusate sodium (COLACE) capsule 100 mg, 100 mg, Oral, BID PRN, Celine Mans, Rahul P, PA-C   heparin injection 5,000 Units, 5,000 Units, Subcutaneous, Q8H, Desai, Rahul P, PA-C   insulin regular, human (MYXREDLIN) 100 units/ 100 mL infusion, , Intravenous, Continuous, Rondel Baton, MD, Last Rate: 6.5 mL/hr at August 31, 2023 2032, 6.5 Units/hr at 08-31-2023 2032   lactated ringers infusion, , Intravenous, Continuous, Rondel Baton, MD, Last Rate: 125 mL/hr at 2023/08/31 1952, New Bag at 08-31-23 1952   polyethylene glycol (MIRALAX / GLYCOLAX) packet 17 g, 17 g, Oral, Daily PRN, Celine Mans, Rahul P, PA-C   sodium chloride 0.9 % bolus 1,000 mL, 1,000 mL, Intravenous, Once, Desai, Rahul P, PA-C No current outpatient medications on file.  Vitals   Vitals:   08/31/23 1800 August 31, 2023 1900 Aug 31, 2023 1930 2023-08-31 1933  BP: 128/80 126/78    Pulse: (!) 120 (!) 112 (!) 114   Resp:  (!) 25 (!) 25   Temp:      TempSrc:      SpO2: 97% 96% 96%   Weight:    122.5 kg  Height:    5\' 7"  (1.702 m)    Body  mass index is 42.29 kg/m.  Physical Exam   Constitutional: Appears well-developed and well-nourished.   Neurologic Examination   The patient is sleepy, but I am able to get him to wake up and tell me his name.  He knows that he is in the hospital in [place], but is unable to give me the name.  He is able to answer simple yes/no questions and name some simple objects.  He is able to follow commands readily.  He is unable to give me the year or month.  I am able to get him to hold all extremities against gravity though he just mildly in all four extremities, this is symmetric.  Labs/Imaging/Neurodiagnostic studies   CBC:  Recent Labs  Lab 08/31/23 1739  WBC 3.5*  NEUTROABS 2.7  HGB 13.5  HCT 39.4  MCV 86.0  PLT 166   Basic Metabolic Panel:  Lab Results  Component Value Date   NA 132 (L) Aug 31, 2023   K 4.1 2023-08-31   CO2 19 (L) 08-31-2023   GLUCOSE 690 (HH) 08/31/23   BUN 23 (H) 31-Aug-2023   CREATININE 1.79 (H) 08-31-2023   CALCIUM 9.3 08/31/2023   GFRNONAA 47 (L) August 31, 2023   Urine Drug Screen:     Component Value Date/Time   LABOPIA NONE DETECTED 08/31/2023 2100   COCAINSCRNUR NONE DETECTED 2023/08/31 2100   LABBENZ POSITIVE (A) Aug 31, 2023  2100   AMPHETMU POSITIVE (A) 08/25/2023 2100   THCU NONE DETECTED 08/25/2023 2100   LABBARB NONE DETECTED 08/25/2023 2100    Alcohol Level     Component Value Date/Time   ETH <10 08/25/2023 1739   CT head was personally reviewed and is negative  ASSESSMENT   Jeremiah Stevens is a 48 y.o. male who presents with seizures in the setting of severe hyperglycemia.  With him now able to answer questions and follow commands with a nonlocalizing exam, I have low suspicion for ongoing status epilepticus.  I would favor continuing Keppra at least in the short-term getting an MRI and EEG.  With him denying headache, no meningismus, no fever or other signs of infection, my suspicion for CNS infection is relatively  low.  RECOMMENDATIONS  MRI brain with and without contrast EEG Keppra 500 mg twice daily Neurology will follow ______________________________________________________________________    Signed, Ritta Slot, MD [facility]

## 2023-08-25 NOTE — ED Triage Notes (Signed)
 Pt BIB [facility] due to seizures.  Family witnessed one seizure and EMS witnessed a second one that lasted about 1 minute.  Pt is very combative when awake.  Pt given 5 versed given IM.  Hx seizures, diabetes, HTN and HIV.  Non-compliant with all medicines.  VS BP 150/90, HR 130, CBG high

## 2023-08-25 NOTE — H&P (Signed)
NAME:  Jeremiah Stevens, MRN:  [id number], DOB:  [birth year], LOS: 0 ADMISSION DATE:  08/25/2023, CONSULTATION DATE:  08/25/23 REFERRING MD:  Eloise Harman CHIEF COMPLAINT:  AMS   History of Present Illness:  Pt is encephelopathic; therefore, this HPI is obtained from chart review. Jeremiah Stevens is a 48 y.o. male who has a PMH including but not limited to DM2 and HIV. He has not seen any providers in years and has been noncompliant with his medication regimen per his sister and brother. He lives at home with his brother.  He was in his normal state of health 2/11 and AM 2/12 when he at breakfast with his brother. Later that morning and into the afternoon, he was not his usual self and would have waxing and waning mental status along with mechanical falls. He progressed to the point of becoming minimally responsive and then had "clenched fingers and convulsions at home".  He does not have any prior hx of seizures.  He was brought to [facility] ED where he remained altered. Workup suggestive of HHS. He was started on fluids and insulin. He had head CT which was negative. CXR unremarkable.  Per brother and sister, he had not had any recent illness nor any complaints. Specifically denied any knowledge or recent fevers/chills/sweats, chest pain, cough, dyspnea, N/V/D, abd pain, myalgias, exposures to known sick contacts.  Given his AMS, PCCM asked to admit to ICU.  Pertinent  Medical History:  has Hyperosmolar hyperglycemic state (HHS) (HCC) on their problem list.   Significant Hospital Events: Including procedures, antibiotic start and stop dates in addition to other pertinent events   2/12 admit.  Interim History / Subjective:  Somnolent. Does protect airway and have intact cough/gag.  Objective:  Blood pressure 126/78, pulse (!) 114, temperature 98.2 F (36.8 C), temperature source Oral, resp. rate (!) 25, height 5\' 7"  (1.702 m), weight 122.5 kg, SpO2 96%.        Intake/Output Summary  (Last 24 hours) at 08/25/2023 2120 Last data filed at 08/25/2023 2110 Gross per 24 hour  Intake 2350 ml  Output --  Net 2350 ml   Filed Weights   08/25/23 1933  Weight: 122.5 kg    Examination: General: Adult male, resting in bed, in NAD. Neuro: Altered, not responsive to voice. Does withdraw. Does have intact cough/gag. HEENT: [place]/AT. Sclerae anicteric. MM dry. Cardiovascular: Tachy, regular, no M/R/G.  Lungs: Respirations even and unlabored.  CTA bilaterally, No W/R/R. Abdomen: Obese. BS x 4, soft, NT/ND.  Musculoskeletal: No gross deformities, no edema.  Skin: Intact, warm, no rashes.   Labs/imaging personally reviewed:  CT head 2/12 > neg CXR 2/12 > neg.  Assessment & Plan:    HHS vs DKA, favor HHS - likely 2/2 medication noncompliance though this appears to have been ongoing for quite some time. Unsure what the acute event was to trigger this admission. Hx DM2 with medication non-compliance. - Admit to ICU given AMS. - Insulin and fluids per hyperglycemia protocol. - Additional 1L NS bolus now. - F/u on BHBA. - Needs DM educator prior to discharge and needs to be set up with PCP for close follow up.  New onset seizures - presumed in setting above. S/p 4.5g Keppra load. - Neurology consulted by EDP, appreciate the assistance. - AED's per neuro. - EEG per neuro.  Hx HIV with possible AIDS? - per brother and sister, he has not seen any provider in years, unsure where he was last seen and no notes  available in our system or care everywhere. - Check HIV Ab and CD4 count. - Will probably need ID consult in AM.  Pseudohyponatremia. AKI. AGMA. - Continue fluids per hyperglycemia protocol. - Follow frequent BMP.   Best practice (evaluated daily):  Diet/type: NPO DVT prophylaxis: prophylactic heparin  Pressure ulcer(s): pressure ulcer assessment deferred  GI prophylaxis: N/A Lines: N/A Foley:  N/A Code Status:  full code Last date of multidisciplinary goals of  care discussion: None yet.  Labs   CBC: Recent Labs  Lab 08/25/23 1739  WBC 3.5*  NEUTROABS 2.7  HGB 13.5  HCT 39.4  MCV 86.0  PLT 166    Basic Metabolic Panel: Recent Labs  Lab 08/25/23 1739 08/25/23 1920  NA 132* 132*  K 4.5 4.1  CL 93* 93*  CO2 20* 19*  GLUCOSE 686* 690*  BUN 20 23*  CREATININE 1.79* 1.79*  CALCIUM 9.4 9.3   GFR: Estimated Creatinine Clearance: 64 mL/min (A) (by C-G formula based on SCr of 1.79 mg/dL (H)). Recent Labs  Lab 08/25/23 1739  WBC 3.5*    Liver Function Tests: Recent Labs  Lab 08/25/23 1739  AST 31  ALT 26  ALKPHOS 91  BILITOT 0.6  PROT 9.1*  ALBUMIN 3.1*   No results for input(s): "LIPASE", "AMYLASE" in the last 168 hours. No results for input(s): "AMMONIA" in the last 168 hours.  ABG    Component Value Date/Time   HCO3 21.6 08/25/2023 1920   ACIDBASEDEF 8.1 (H) 08/25/2023 1920   O2SAT 70.8 08/25/2023 1920     Coagulation Profile: No results for input(s): "INR", "PROTIME" in the last 168 hours.  Cardiac Enzymes: No results for input(s): "CKTOTAL", "CKMB", "CKMBINDEX", "TROPONINI" in the last 168 hours.  HbA1C: No results found for: "HGBA1C"  CBG: Recent Labs  Lab 08/25/23 1918 08/25/23 2031  GLUCAP >600* 364*    Review of Systems:   Unable to obtain as pt is encephalopathic.  Past Medical History:  He,  has a past medical history of Cancer (HCC), Diabetes (HCC), HIV (human immunodeficiency virus infection) (HCC), and HTN (hypertension).   Surgical History:  Not available.  Social History:      Family History:  His family history is not on file.   Allergies No Known Allergies   Home Medications  Prior to Admission medications   Not on File     Critical care time: 40 min.    Rutherford Guys, PA - [facility] For pager details, please see AMION or use Epic chat  After 1900, please call [facility] for cross coverage needs 08/25/2023, 9:30 PM

## 2023-08-26 ENCOUNTER — Inpatient Hospital Stay (HOSPITAL_COMMUNITY): Payer: Medicaid Other

## 2023-08-26 ENCOUNTER — Telehealth (HOSPITAL_COMMUNITY): Payer: Self-pay | Admitting: Pharmacy Technician

## 2023-08-26 ENCOUNTER — Encounter (HOSPITAL_COMMUNITY): Payer: Self-pay | Admitting: Pulmonary Disease

## 2023-08-26 ENCOUNTER — Other Ambulatory Visit (HOSPITAL_COMMUNITY): Payer: Self-pay

## 2023-08-26 DIAGNOSIS — E1100 Type 2 diabetes mellitus with hyperosmolarity without nonketotic hyperglycemic-hyperosmolar coma (NKHHC): Secondary | ICD-10-CM | POA: Diagnosis not present

## 2023-08-26 DIAGNOSIS — E7220 Disorder of urea cycle metabolism, unspecified: Secondary | ICD-10-CM

## 2023-08-26 DIAGNOSIS — R569 Unspecified convulsions: Secondary | ICD-10-CM | POA: Diagnosis not present

## 2023-08-26 DIAGNOSIS — R739 Hyperglycemia, unspecified: Secondary | ICD-10-CM | POA: Diagnosis not present

## 2023-08-26 DIAGNOSIS — G9340 Encephalopathy, unspecified: Secondary | ICD-10-CM

## 2023-08-26 DIAGNOSIS — J9601 Acute respiratory failure with hypoxia: Secondary | ICD-10-CM | POA: Diagnosis not present

## 2023-08-26 DIAGNOSIS — B20 Human immunodeficiency virus [HIV] disease: Secondary | ICD-10-CM

## 2023-08-26 DIAGNOSIS — D61818 Other pancytopenia: Secondary | ICD-10-CM

## 2023-08-26 DIAGNOSIS — I10 Essential (primary) hypertension: Secondary | ICD-10-CM

## 2023-08-26 LAB — CBC
HCT: 33.3 % — ABNORMAL LOW (ref 39.0–52.0)
Hemoglobin: 11.6 g/dL — ABNORMAL LOW (ref 13.0–17.0)
MCH: 29.7 pg (ref 26.0–34.0)
MCHC: 34.8 g/dL (ref 30.0–36.0)
MCV: 85.2 fL (ref 80.0–100.0)
Platelets: 123 10*3/uL — ABNORMAL LOW (ref 150–400)
RBC: 3.91 MIL/uL — ABNORMAL LOW (ref 4.22–5.81)
RDW: 13.5 % (ref 11.5–15.5)
WBC: 2.6 10*3/uL — ABNORMAL LOW (ref 4.0–10.5)
nRBC: 0 % (ref 0.0–0.2)

## 2023-08-26 LAB — BETA-HYDROXYBUTYRIC ACID
Beta-Hydroxybutyric Acid: 0.07 mmol/L (ref 0.05–0.27)
Beta-Hydroxybutyric Acid: 0.35 mmol/L — ABNORMAL HIGH (ref 0.05–0.27)

## 2023-08-26 LAB — BLOOD GAS, VENOUS
Acid-Base Excess: 1.9 mmol/L (ref 0.0–2.0)
Bicarbonate: 26.6 mmol/L (ref 20.0–28.0)
O2 Saturation: 98.9 %
Patient temperature: 37
pCO2, Ven: 41 mm[Hg] — ABNORMAL LOW (ref 44–60)
pH, Ven: 7.42 (ref 7.25–7.43)
pO2, Ven: 97 mm[Hg] — ABNORMAL HIGH (ref 32–45)

## 2023-08-26 LAB — MRSA NEXT GEN BY PCR, NASAL: MRSA by PCR Next Gen: NOT DETECTED

## 2023-08-26 LAB — BASIC METABOLIC PANEL
Anion gap: 6 (ref 5–15)
Anion gap: 7 (ref 5–15)
Anion gap: 7 (ref 5–15)
BUN: 16 mg/dL (ref 6–20)
BUN: 14 mg/dL (ref 6–20)
BUN: 15 mg/dL (ref 6–20)
CO2: 22 mmol/L (ref 22–32)
CO2: 25 mmol/L (ref 22–32)
CO2: 23 mmol/L (ref 22–32)
Calcium: 8.3 mg/dL — ABNORMAL LOW (ref 8.9–10.3)
Calcium: 8.4 mg/dL — ABNORMAL LOW (ref 8.9–10.3)
Calcium: 8.4 mg/dL — ABNORMAL LOW (ref 8.9–10.3)
Chloride: 111 mmol/L (ref 98–111)
Chloride: 110 mmol/L (ref 98–111)
Chloride: 109 mmol/L (ref 98–111)
Creatinine, Ser: 1.01 mg/dL (ref 0.61–1.24)
Creatinine, Ser: 0.99 mg/dL (ref 0.61–1.24)
Creatinine, Ser: 1.04 mg/dL (ref 0.61–1.24)
GFR, Estimated: >60 mL/min (ref >60)
GFR, Estimated: >60 mL/min (ref >60)
GFR, Estimated: >60 mL/min (ref >60)
Glucose, Bld: 140 mg/dL — ABNORMAL HIGH (ref 70–99)
Glucose, Bld: 95 mg/dL (ref 70–99)
Glucose, Bld: 129 mg/dL — ABNORMAL HIGH (ref 70–99)
Potassium: 4 mmol/L (ref 3.5–5.1)
Potassium: 3.7 mmol/L (ref 3.5–5.1)
Potassium: 4.1 mmol/L (ref 3.5–5.1)
Sodium: 139 mmol/L (ref 135–145)
Sodium: 142 mmol/L (ref 135–145)
Sodium: 139 mmol/L (ref 135–145)

## 2023-08-26 LAB — MAGNESIUM: Magnesium: 2 mg/dL (ref 1.7–2.4)

## 2023-08-26 LAB — GLUCOSE, CAPILLARY
Glucose-Capillary: 102 mg/dL — ABNORMAL HIGH (ref 70–99)
Glucose-Capillary: 118 mg/dL — ABNORMAL HIGH (ref 70–99)
Glucose-Capillary: 130 mg/dL — ABNORMAL HIGH (ref 70–99)
Glucose-Capillary: 139 mg/dL — ABNORMAL HIGH (ref 70–99)
Glucose-Capillary: 144 mg/dL — ABNORMAL HIGH (ref 70–99)
Glucose-Capillary: 160 mg/dL — ABNORMAL HIGH (ref 70–99)
Glucose-Capillary: 180 mg/dL — ABNORMAL HIGH (ref 70–99)
Glucose-Capillary: 203 mg/dL — ABNORMAL HIGH (ref 70–99)
Glucose-Capillary: 214 mg/dL — ABNORMAL HIGH (ref 70–99)
Glucose-Capillary: 219 mg/dL — ABNORMAL HIGH (ref 70–99)
Glucose-Capillary: 224 mg/dL — ABNORMAL HIGH (ref 70–99)

## 2023-08-26 LAB — HEMOGLOBIN A1C
Hgb A1c MFr Bld: 11.7 % — ABNORMAL HIGH (ref 4.8–5.6)
Mean Plasma Glucose: 289.09 mg/dL

## 2023-08-26 LAB — LACTIC ACID, PLASMA: Lactic Acid, Venous: 1.7 mmol/L (ref 0.5–1.9)

## 2023-08-26 LAB — T-HELPER CELLS (CD4) COUNT (NOT AT ARMC)
CD4 % Helper T Cell: 8 % — ABNORMAL LOW (ref 33–65)
CD4 T Cell Abs: 43 /uL — ABNORMAL LOW (ref 400–1790)

## 2023-08-26 LAB — HIV ANTIBODY (ROUTINE TESTING W REFLEX): HIV Screen 4th Generation wRfx: REACTIVE — AB

## 2023-08-26 LAB — PHOSPHORUS: Phosphorus: 3.3 mg/dL (ref 2.5–4.6)

## 2023-08-26 LAB — OSMOLALITY: Osmolality: 303 mosm/kg — ABNORMAL HIGH (ref 275–295)

## 2023-08-26 LAB — AMMONIA: Ammonia: 51 umol/L — ABNORMAL HIGH (ref 9–35)

## 2023-08-26 LAB — CRYPTOCOCCAL ANTIGEN
Crypto Ag: POSITIVE — AB
Cryptococcal Ag Titer: 640 — AB

## 2023-08-26 MED ORDER — LORAZEPAM 1 MG PO TABS
1.0000 mg | ORAL_TABLET | Freq: Once | ORAL | Status: DC | PRN
Start: 2023-08-26 — End: 2023-08-29

## 2023-08-26 MED ORDER — LORAZEPAM 2 MG/ML IJ SOLN
INTRAMUSCULAR | Status: AC
Start: 2023-08-26 — End: 2023-08-26
  Administered 2023-08-26: 2 mg via INTRAVENOUS
  Filled 2023-08-26: qty 1

## 2023-08-26 MED ORDER — DEXTROSE 5% FOR FLUSHING BEFORE AND AFTER AMBISOME
10.0000 mL | INTRAVENOUS | Status: DC
Start: 2023-08-26 — End: 2023-08-29
  Administered 2023-08-27 – 2023-08-29 (×4): 10 mL via INTRAVENOUS
  Filled 2023-08-26: qty 250
  Filled 2023-08-26 (×2): qty 10
  Filled 2023-08-26: qty 250

## 2023-08-26 MED ORDER — SODIUM CHLORIDE 0.9 % IV BOLUS FOR AMBISOME
500.0000 mL | INTRAVENOUS | Status: DC
Start: 2023-08-26 — End: 2023-08-28
  Administered 2023-08-26 – 2023-08-28 (×2): 500 mL via INTRAVENOUS

## 2023-08-26 MED ORDER — DEXTROSE 5 % IV SOLN
3.0000 mg/kg | INTRAVENOUS | Status: DC
  Filled 2023-08-26: qty 65

## 2023-08-26 MED ORDER — BICTEGRAVIR-EMTRICITAB-TENOFOV 50-200-25 MG PO TABS
1.0000 | ORAL_TABLET | Freq: Every day | ORAL | Status: DC
  Administered 2023-08-26: 1 via ORAL
  Filled 2023-08-26 (×3): qty 1

## 2023-08-26 MED ORDER — FLUCYTOSINE 500 MG PO CAPS
25.0000 mg/kg | ORAL_CAPSULE | Freq: Four times a day (QID) | ORAL | Status: DC
  Filled 2023-08-26 (×2): qty 1

## 2023-08-26 MED ORDER — INSULIN ASPART 100 UNIT/ML IJ SOLN
5.0000 [IU] | Freq: Once | INTRAMUSCULAR | Status: AC
  Administered 2023-08-26: 5 [IU] via SUBCUTANEOUS

## 2023-08-26 MED ORDER — DIPHENHYDRAMINE HCL 25 MG PO CAPS: 25.0000 mg | ORAL_CAPSULE | Freq: Every day | ORAL | Status: DC | PRN

## 2023-08-26 MED ORDER — FLUCYTOSINE 500 MG PO CAPS: 25.0000 mg/kg | ORAL_CAPSULE | Freq: Four times a day (QID) | ORAL | Status: DC

## 2023-08-26 MED ORDER — DEXTROSE 5% FOR FLUSHING BEFORE AND AFTER AMBISOME
10.0000 mL | INTRAVENOUS | Status: DC
  Administered 2023-08-28 – 2023-08-29 (×2): 10 mL via INTRAVENOUS
  Filled 2023-08-26: qty 250
  Filled 2023-08-26 (×2): qty 10

## 2023-08-26 MED ORDER — MEPERIDINE HCL 25 MG/ML IJ SOLN: 25.0000 mg | INTRAMUSCULAR | Status: DC | PRN

## 2023-08-26 MED ORDER — LACTULOSE ENEMA
300.0000 mL | Freq: Once | ORAL | Status: DC
  Filled 2023-08-26: qty 300

## 2023-08-26 MED ORDER — ACETAMINOPHEN 325 MG PO TABS
650.0000 mg | ORAL_TABLET | Freq: Every day | ORAL | Status: DC | PRN
  Administered 2023-08-28 – 2023-08-29 (×3): 650 mg via ORAL
  Filled 2023-08-26 (×3): qty 2

## 2023-08-26 MED ORDER — SULFAMETHOXAZOLE-TRIMETHOPRIM 800-160 MG PO TABS
1.0000 | ORAL_TABLET | Freq: Every day | ORAL | Status: DC
  Administered 2023-08-26 – 2023-09-02 (×8): 1 via ORAL
  Filled 2023-08-26 (×9): qty 1

## 2023-08-26 MED ORDER — LORAZEPAM 2 MG/ML IJ SOLN: 2.0000 mg | Freq: Once | INTRAMUSCULAR | Status: AC

## 2023-08-26 MED ORDER — FLUCYTOSINE 500 MG PO CAPS
2000.0000 mg | ORAL_CAPSULE | Freq: Four times a day (QID) | ORAL | Status: DC
  Administered 2023-08-27 (×4): 2000 mg via ORAL
  Filled 2023-08-26 (×6): qty 4

## 2023-08-26 MED ORDER — DEXTROSE 5 % IV SOLN
300.0000 mg | INTRAVENOUS | Status: DC
  Administered 2023-08-27: 300 mg via INTRAVENOUS
  Filled 2023-08-26: qty 75

## 2023-08-26 MED ORDER — CHLORHEXIDINE GLUCONATE CLOTH 2 % EX PADS
6.0000 | MEDICATED_PAD | Freq: Every day | CUTANEOUS | Status: DC
  Administered 2023-08-27 – 2023-09-02 (×7): 6 via TOPICAL

## 2023-08-26 MED ORDER — INSULIN ASPART 100 UNIT/ML IJ SOLN
2.0000 [IU] | INTRAMUSCULAR | Status: DC
  Administered 2023-08-26: 6 [IU] via SUBCUTANEOUS
  Administered 2023-08-26: 4 [IU] via SUBCUTANEOUS
  Administered 2023-08-26: 6 [IU] via SUBCUTANEOUS
  Administered 2023-08-27 (×3): 2 [IU] via SUBCUTANEOUS
  Administered 2023-08-28 (×2): 6 [IU] via SUBCUTANEOUS

## 2023-08-26 MED ORDER — LACTULOSE 10 GM/15ML PO SOLN
10.0000 g | Freq: Every day | ORAL | Status: DC
  Administered 2023-08-26 – 2023-09-02 (×8): 10 g via ORAL
  Filled 2023-08-26 (×8): qty 15

## 2023-08-26 MED ORDER — INSULIN GLARGINE-YFGN 100 UNIT/ML ~~LOC~~ SOLN
16.0000 [IU] | Freq: Every day | SUBCUTANEOUS | Status: DC
  Administered 2023-08-26 – 2023-08-27 (×2): 16 [IU] via SUBCUTANEOUS
  Filled 2023-08-26 (×3): qty 0.16

## 2023-08-26 MED ORDER — DIPHENHYDRAMINE HCL 50 MG/ML IJ SOLN: 25.0000 mg | Freq: Every day | INTRAMUSCULAR | Status: DC | PRN

## 2023-08-26 MED ORDER — SODIUM CHLORIDE 0.9 % IV BOLUS FOR AMBISOME
500.0000 mL | INTRAVENOUS | Status: DC
  Administered 2023-08-27 – 2023-08-28 (×2): 500 mL via INTRAVENOUS

## 2023-08-26 NOTE — Consult Note (Signed)
 [facility]         Reason for Consult: seizure in the setting of hiv disease   Referring Physician: clark  Principal Problem:   Hyperosmolar hyperglycemic state (HHS) [facility]) Active Problems:   Seizure (HCC)    HPI: Jeremiah Stevens is a 48 y.o. 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 she is lost to follow up, previously on biktarvy and well controlled. She was admitted on 2/12  with hx of confusion and witnessed seizure by family, received ativan. She was evaluated by neurology who started iv keppra, and ordered mri of brain and EEG. Has been in the ICU for monitoring. This morning, still difficult to arouse but remains afebrile, no rash, no nuchal rigidity. Drug screen was +amph/BZ. NCHCT no intracranial lesions or enlarged ventricles. Cxr no signs of infiltrate, labs significant for BS in 600 with aki cr 1.79. LA 1.7. she was started on insulin drip which has corrected her hyperglycemia.  Labs showing cd 4 count of 43, with 8%. VL pending  Past Medical History:  Diagnosis Date   Cancer (HCC)    seizures   Diabetes (HCC)    HIV (human immunodeficiency virus infection) (HCC)    HTN (hypertension)     Allergies: No Known Allergies    MEDICATIONS:  bictegravir-emtricitabine-tenofovir AF  1 tablet Oral Daily   heparin  5,000 Units Subcutaneous Q8H   insulin aspart  2-6 Units Subcutaneous Q4H   insulin glargine-yfgn  16 Units Subcutaneous Daily   lactulose  10 g Oral Daily   lactulose  300 mL Rectal Once   sulfamethoxazole-trimethoprim  1 tablet Oral Daily       No family history on file.  Review of Systems - unable to obtain since encephalopathic   OBJECTIVE: Temp:  [96 F (35.6 C)-98.2 F (36.8 C)] 97.6 F (36.4 C) (02/13 1100) Pulse Rate:  [91-124] 93 (02/13 1400) Resp:  [16-25] 20 (02/13 1400) BP: (115-156)/(78-117) 115/79 (02/13 1400) SpO2:  [91 %-99 %] 97 % (02/13 1400) Weight:   [119.5 kg-122.5 kg] 119.5 kg (02/12 2300) Physical Exam  Constitutional:  oriented to person, place, and time. appears well-developed and well-nourished. No distress.  HENT: [place]/AT, PERRLA, no scleral icterus Mouth/Throat: Oropharynx is clear and moist. No oropharyngeal exudate.  Cardiovascular: Normal rate, regular rhythm and normal heart sounds. Exam reveals no gallop and no friction rub.  No murmur heard.  Pulmonary/Chest: Effort normal and breath sounds normal. No respiratory distress.  has no wheezes.  Neck = supple, no nuchal rigidity Abdominal: Soft. Bowel sounds are normal.  exhibits no distension. There is no tenderness.  Lymphadenopathy: no cervical adenopathy. No axillary adenopathy Skin: Skin is warm and dry. No rash noted. No erythema.     LABS: Results for orders placed or performed during the hospital encounter of 08/25/23 (from the past 48 hours)  Comprehensive metabolic panel     Status: Abnormal   Collection Time: 08/25/23  5:39 PM  Result Value Ref Range   Sodium 132 (L) 135 - 145 mmol/L   Potassium 4.5 3.5 - 5.1 mmol/L   Chloride 93 (L) 98 - 111 mmol/L   CO2 20 (L) 22 - 32 mmol/L   Glucose, Bld 686 (HH) 70 - 99 mg/dL    Comment: CRITICAL RESULT CALLED TO, READ BACK BY AND VERIFIED WITH Ronalee Red, RN @ 1840 08/25/23 BY SEKDAHL Glucose reference range applies only to samples taken after fasting for at least  8 hours.    BUN 20 6 - 20 mg/dL   Creatinine, Ser 2.13 (H) 0.61 - 1.24 mg/dL   Calcium 9.4 8.9 - 08.6 mg/dL   Total Protein 9.1 (H) 6.5 - 8.1 g/dL   Albumin 3.1 (L) 3.5 - 5.0 g/dL   AST 31 15 - 41 U/L   ALT 26 0 - 44 U/L   Alkaline Phosphatase 91 38 - 126 U/L   Total Bilirubin 0.6 0.0 - 1.2 mg/dL   GFR, Estimated 47 (L) >60 mL/min    Comment: (NOTE) Calculated using the CKD-EPI Creatinine Equation (2021)    Anion gap 19 (H) 5 - 15    Comment: Performed at [facility], [address] [place], [place] [address]  CBC with Differential/Platelet      Status: Abnormal   Collection Time: 08/25/23  5:39 PM  Result Value Ref Range   WBC 3.5 (L) 4.0 - 10.5 K/uL   RBC 4.58 4.22 - 5.81 MIL/uL   Hemoglobin 13.5 13.0 - 17.0 g/dL   HCT 96.2 95.2 - 84.1 %   MCV 86.0 80.0 - 100.0 fL   MCH 29.5 26.0 - 34.0 pg   MCHC 34.3 30.0 - 36.0 g/dL   RDW 32.4 40.1 - 02.7 %   Platelets 166 150 - 400 K/uL   nRBC 0.0 0.0 - 0.2 %   Neutrophils Relative % 77 %   Neutro Abs 2.7 1.7 - 7.7 K/uL   Lymphocytes Relative 12 %   Lymphs Abs 0.4 (L) 0.7 - 4.0 K/uL   Monocytes Relative 8 %   Monocytes Absolute 0.3 0.1 - 1.0 K/uL   Eosinophils Relative 2 %   Eosinophils Absolute 0.1 0.0 - 0.5 K/uL   Basophils Relative 0 %   Basophils Absolute 0.0 0.0 - 0.1 K/uL   Immature Granulocytes 1 %   Abs Immature Granulocytes 0.04 0.00 - 0.07 K/uL    Comment: Performed at [facility], [address], [place], [place] [address]  Ethanol     Status: None   Collection Time: 08/25/23  5:39 PM  Result Value Ref Range   Alcohol, Ethyl (B) <10 <10 mg/dL    Comment: (NOTE) Lowest detectable limit for serum alcohol is 10 mg/dL.  For medical purposes only. Performed at [facility], [address], [place], [place] [address]   CBG monitoring, ED     Status: Abnormal   Collection Time: 08/25/23  7:18 PM  Result Value Ref Range   Glucose-Capillary >600 (HH) 70 - 99 mg/dL    Comment: Glucose reference range applies only to samples taken after fasting for at least 8 hours.  Resp panel by RT-PCR (RSV, Flu A&B, Covid) Anterior Nasal Swab     Status: None   Collection Time: 08/25/23  7:20 PM   Specimen: Anterior Nasal Swab  Result Value Ref Range   SARS Coronavirus 2 by RT PCR NEGATIVE NEGATIVE   Influenza A by PCR NEGATIVE NEGATIVE   Influenza B by PCR NEGATIVE NEGATIVE    Comment: (NOTE) The Xpert Xpress SARS-CoV-2/FLU/RSV plus assay is intended as an aid in the diagnosis of influenza from Nasopharyngeal swab specimens and should not be used as a sole basis for  treatment. Nasal washings and aspirates are unacceptable for Xpert Xpress SARS-CoV-2/FLU/RSV testing.  Fact Sheet for Patients: BloggerCourse.com  Fact Sheet for Healthcare Providers: SeriousBroker.it  This test is not yet approved or cleared by the [place] and has been authorized for  detection and/or diagnosis of SARS-CoV-2 by FDA under an Emergency Use Authorization (EUA). This EUA will remain in effect (meaning this test can be used) for the duration of the COVID-19 declaration under Section 564(b)(1) of the Act, 21 U.S.C. section 360bbb-3(b)(1), unless the authorization is terminated or revoked.     Resp Syncytial Virus by PCR NEGATIVE NEGATIVE    Comment: (NOTE) Fact Sheet for Patients: BloggerCourse.com  Fact Sheet for Healthcare Providers: SeriousBroker.it  This test is not yet approved or cleared by the [place] FDA and has been authorized for detection and/or diagnosis of SARS-CoV-2 by FDA under an Emergency Use Authorization (EUA). This EUA will remain in effect (meaning this test can be used) for the duration of the COVID-19 declaration under Section 564(b)(1) of the Act, 21 U.S.C. section 360bbb-3(b)(1), unless the authorization is terminated or revoked.  Performed at [facility] Lab, [address] [place], [place] [address]   Blood gas, venous     Status: Abnormal   Collection Time: 08/25/23  7:20 PM  Result Value Ref Range   pH, Ven 7.15 (LL) 7.25 - 7.43    Comment: REPEATED TO VERIFY CRITICAL RESULT CALLED TO, READ BACK BY AND VERIFIED WITH: Miki Kins, RN ON [id number] BY SWE[employer]LL [employer]    pCO2, Ven 62 (H) 44 - 60 mmHg   pO2, Ven 49 (H) 32 - 45 mmHg   Bicarbonate 21.6 20.0 - 28.0 mmol/L   Acid-base deficit 8.1 (H) 0.0 - 2.0 mmol/L   O2 Saturation 70.8 %   Patient temperature 37.0    Collection site VEIN     Comment:  Performed at [facility], [address] [place], [place] [address]  Osmolality     Status: Abnormal   Collection Time: 08/25/23  7:20 PM  Result Value Ref Range   Osmolality 345 (HH) 275 - 295 mOsm/kg    Comment: REPEATED TO VERIFY CRITICAL RESULT CALLED TO, READ BACK BY AND VERIFIED WITH: CALLED TO ABBY WOODY RN @2004  ON 08/25/23 BY MAB Performed at [facility], [address], [place], [place] [address]   Basic metabolic panel     Status: Abnormal   Collection Time: 08/25/23  7:20 PM  Result Value Ref Range   Sodium 132 (L) 135 - 145 mmol/L   Potassium 4.1 3.5 - 5.1 mmol/L   Chloride 93 (L) 98 - 111 mmol/L   CO2 19 (L) 22 - 32 mmol/L   Glucose, Bld 690 (HH) 70 - 99 mg/dL    Comment: CRITICAL RESULT CALLED TO, READ BACK BY AND VERIFIED WITH A. WOODY, RN AT 2005 02.12.25 D. BLU Glucose reference range applies only to samples taken after fasting for at least 8 hours.    BUN 23 (H) 6 - 20 mg/dL   Creatinine, Ser 4.40 (H) 0.61 - 1.24 mg/dL   Calcium 9.3 8.9 - 10.2 mg/dL   GFR, Estimated 47 (L) >60 mL/min    Comment: (NOTE) Calculated using the CKD-EPI Creatinine Equation (2021)    Anion gap 20 (H) 5 - 15    Comment: Performed at [facility], [address], [place], [place] [address]  Beta-hydroxybutyric acid     Status: None   Collection Time: 08/25/23  7:20 PM  Result Value Ref Range   Beta-Hydroxybutyric Acid 0.27 0.05 - 0.27 mmol/L    Comment: Performed at [facility], [address] [place], [place] [address]  CBG monitoring, ED  Status: Abnormal   Collection Time: 08/25/23  8:31 PM  Result Value Ref Range   Glucose-Capillary 364 (H) 70 - 99 mg/dL    Comment: Glucose reference range applies only to samples taken after fasting for at least 8 hours.  Urinalysis, Routine w reflex microscopic -Urine, Clean Catch     Status: Abnormal   Collection Time: 08/25/23  9:00 PM  Result Value Ref Range   Color, Urine STRAW (A) YELLOW   APPearance  CLEAR CLEAR   Specific Gravity, Urine 1.027 1.005 - 1.030   pH 5.0 5.0 - 8.0   Glucose, UA >=500 (A) NEGATIVE mg/dL   Hgb urine dipstick MODERATE (A) NEGATIVE   Bilirubin Urine NEGATIVE NEGATIVE   Ketones, ur NEGATIVE NEGATIVE mg/dL   Protein, ur NEGATIVE NEGATIVE mg/dL   Nitrite NEGATIVE NEGATIVE   Leukocytes,Ua NEGATIVE NEGATIVE   RBC / HPF 0-5 0 - 5 RBC/hpf   WBC, UA 0-5 0 - 5 WBC/hpf   Bacteria, UA NONE SEEN NONE SEEN   Squamous Epithelial / HPF 0-5 0 - 5 /HPF   Mucus PRESENT     Comment: Performed at [facility], [address] [place], [place] [address]  Rapid urine drug screen (hospital performed)     Status: Abnormal   Collection Time: 08/25/23  9:00 PM  Result Value Ref Range   Opiates NONE DETECTED NONE DETECTED   Cocaine NONE DETECTED NONE DETECTED   Benzodiazepines POSITIVE (A) NONE DETECTED   Amphetamines POSITIVE (A) NONE DETECTED   Tetrahydrocannabinol NONE DETECTED NONE DETECTED   Barbiturates NONE DETECTED NONE DETECTED    Comment: (NOTE) DRUG SCREEN FOR MEDICAL PURPOSES ONLY.  IF CONFIRMATION IS NEEDED FOR ANY PURPOSE, NOTIFY LAB WITHIN 5 DAYS.  LOWEST DETECTABLE LIMITS FOR URINE DRUG SCREEN Drug Class                     Cutoff (ng/mL) Amphetamine and metabolites    1000 Barbiturate and metabolites    200 Benzodiazepine                 200 Opiates and metabolites        300 Cocaine and metabolites        300 THC                            50 Performed at [facility], [address] [place], [place] [address]   CBG monitoring, ED     Status: Abnormal   Collection Time: 08/25/23  9:28 PM  Result Value Ref Range   Glucose-Capillary 281 (H) 70 - 99 mg/dL    Comment: Glucose reference range applies only to samples taken after fasting for at least 8 hours.  Glucose, capillary     Status: Abnormal   Collection Time: 08/25/23 10:45 PM  Result Value Ref Range   Glucose-Capillary 161 (H) 70 - 99 mg/dL    Comment: Glucose reference range  applies only to samples taken after fasting for at least 8 hours.  MRSA Next Gen by PCR, Nasal     Status: None   Collection Time: 08/25/23 10:49 PM   Specimen: Nasal Mucosa; Nasal Swab  Result Value Ref Range   MRSA by PCR Next Gen NOT DETECTED NOT DETECTED    Comment: (NOTE) The GeneXpert MRSA Assay (FDA approved for NASAL specimens only), is one component of a comprehensive MRSA colonization surveillance program. It is not intended  to diagnose MRSA infection nor to guide or monitor treatment for MRSA infections. Test performance is not FDA approved in patients less than 48 years old. Performed at [facility], [address], [place], [place] [address]   Glucose, capillary     Status: Abnormal   Collection Time: 08/25/23 11:40 PM  Result Value Ref Range   Glucose-Capillary 186 (H) 70 - 99 mg/dL    Comment: Glucose reference range applies only to samples taken after fasting for at least 8 hours.  Glucose, capillary     Status: Abnormal   Collection Time: 08/26/23 12:36 AM  Result Value Ref Range   Glucose-Capillary 214 (H) 70 - 99 mg/dL    Comment: Glucose reference range applies only to samples taken after fasting for at least 8 hours.  Lactic acid, plasma     Status: None   Collection Time: 08/26/23 12:39 AM  Result Value Ref Range   Lactic Acid, Venous 1.7 0.5 - 1.9 mmol/L    Comment: Performed at [facility], [address], [place], [place] [address]  T-helper cells (CD4) count (not at [facility])     Status: Abnormal   Collection Time: 08/26/23 12:39 AM  Result Value Ref Range   CD4 T Cell Abs 43 (L) 400 - 1,790 /uL   CD4 % Helper T Cell 8 (L) 33 - 65 %    Comment: Performed at [facility], [address], [place], [place] [address]  HIV Antibody (routine testing w rflx)     Status: Abnormal   Collection Time: 08/26/23 12:39 AM  Result Value Ref Range   HIV Screen 4th Generation wRfx Reactive (A) Non Reactive    Comment: (NOTE) Reactive  result does not distinguish HIV-1 p24 antigen, HIV-1  antibody, HIV-2 antibody, and HIV-1 group O antibody. Results  reactive by HIV Antigen/Antibody EIA must be confirmed. Sent for  confirmation. Performed at [facility], [address], [place], [place] [address]   Glucose, capillary     Status: Abnormal   Collection Time: 08/26/23  1:42 AM  Result Value Ref Range   Glucose-Capillary 203 (H) 70 - 99 mg/dL    Comment: Glucose reference range applies only to samples taken after fasting for at least 8 hours.  Glucose, capillary     Status: Abnormal   Collection Time: 08/26/23  2:58 AM  Result Value Ref Range   Glucose-Capillary 139 (H) 70 - 99 mg/dL    Comment: Glucose reference range applies only to samples taken after fasting for at least 8 hours.  Basic metabolic panel     Status: Abnormal   Collection Time: 08/26/23  3:28 AM  Result Value Ref Range   Sodium 139 135 - 145 mmol/L    Comment: DELTA CHECK NOTED   Potassium 4.0 3.5 - 5.1 mmol/L   Chloride 111 98 - 111 mmol/L   CO2 22 22 - 32 mmol/L   Glucose, Bld 140 (H) 70 - 99 mg/dL    Comment: Glucose reference range applies only to samples taken after fasting for at least 8 hours.   BUN 16 6 - 20 mg/dL   Creatinine, Ser 2.95 0.61 - 1.24 mg/dL   Calcium 8.3 (L) 8.9 - 10.3 mg/dL   GFR, Estimated >62 >13 mL/min    Comment: (NOTE) Calculated using the CKD-EPI Creatinine Equation (2021)    Anion gap 6 5 - 15    Comment: Performed at [facility], [address], [place], [place] [address]  Beta-hydroxybutyric acid     Status: None   Collection Time: 08/26/23  3:28 AM  Result Value Ref Range   Beta-Hydroxybutyric Acid 0.07 0.05 - 0.27 mmol/L    Comment: Performed at [facility], [address] [place], [place] [address]  Hemoglobin A1c     Status: Abnormal   Collection Time: 08/26/23  3:28 AM  Result Value Ref Range   Hgb A1c MFr Bld 11.7 (H) 4.8 - 5.6 %    Comment: (NOTE) Pre diabetes:           5.7%-6.4%  Diabetes:              >6.4%  Glycemic control for   <7.0% adults with diabetes    Mean Plasma Glucose 289.09 mg/dL    Comment: Performed at [facility], [address], [place], [place] [address]  Glucose, capillary     Status: Abnormal   Collection Time: 08/26/23  4:07 AM  Result Value Ref Range   Glucose-Capillary 160 (H) 70 - 99 mg/dL    Comment: Glucose reference range applies only to samples taken after fasting for at least 8 hours.  Glucose, capillary     Status: Abnormal   Collection Time: 08/26/23  5:13 AM  Result Value Ref Range   Glucose-Capillary 144 (H) 70 - 99 mg/dL    Comment: Glucose reference range applies only to samples taken after fasting for at least 8 hours.  Glucose, capillary     Status: Abnormal   Collection Time: 08/26/23  7:17 AM  Result Value Ref Range   Glucose-Capillary 130 (H) 70 - 99 mg/dL    Comment: Glucose reference range applies only to samples taken after fasting for at least 8 hours.  Glucose, capillary     Status: Abnormal   Collection Time: 08/26/23  9:24 AM  Result Value Ref Range   Glucose-Capillary 102 (H) 70 - 99 mg/dL    Comment: Glucose reference range applies only to samples taken after fasting for at least 8 hours.  Blood gas, venous     Status: Abnormal   Collection Time: 08/26/23  9:31 AM  Result Value Ref Range   pH, Ven 7.42 7.25 - 7.43   pCO2, Ven 41 (L) 44 - 60 mmHg   pO2, Ven 97 (H) 32 - 45 mmHg   Bicarbonate 26.6 20.0 - 28.0 mmol/L   Acid-Base Excess 1.9 0.0 - 2.0 mmol/L   O2 Saturation 98.9 %   Patient temperature 37.0    Collection site LEFT HAND    Drawn by Anastasia Fiedler [phone]     Comment: Performed at [facility], [address], [place], [place] [address]  Basic metabolic panel     Status: Abnormal   Collection Time: 08/26/23  9:33 AM  Result Value Ref Range   Sodium 142 135 - 145 mmol/L   Potassium 3.7 3.5 - 5.1 mmol/L   Chloride 110 98 - 111 mmol/L   CO2 25 22 - 32 mmol/L   Glucose, Bld  95 70 - 99 mg/dL    Comment: Glucose reference range applies only to samples taken after fasting for at least 8 hours.   BUN 14 6 - 20 mg/dL   Creatinine, Ser 2.13 0.61 - 1.24 mg/dL   Calcium 8.4 (L) 8.9 - 10.3 mg/dL   GFR, Estimated >08 >65 mL/min    Comment: (NOTE) Calculated using the CKD-EPI Creatinine Equation (2021)  Anion gap 7 5 - 15    Comment: Performed at [facility], [address], [place], [place] [address]  CBC     Status: Abnormal   Collection Time: 08/26/23  9:33 AM  Result Value Ref Range   WBC 2.6 (L) 4.0 - 10.5 K/uL   RBC 3.91 (L) 4.22 - 5.81 MIL/uL   Hemoglobin 11.6 (L) 13.0 - 17.0 g/dL   HCT 38.7 (L) 56.4 - 33.2 %   MCV 85.2 80.0 - 100.0 fL   MCH 29.7 26.0 - 34.0 pg   MCHC 34.8 30.0 - 36.0 g/dL   RDW 95.1 88.4 - 16.6 %   Platelets 123 (L) 150 - 400 K/uL    Comment: REPEATED TO VERIFY   nRBC 0.0 0.0 - 0.2 %    Comment: Performed at [facility], [address] [place], [place] [address]  Magnesium     Status: None   Collection Time: 08/26/23  9:33 AM  Result Value Ref Range   Magnesium 2.0 1.7 - 2.4 mg/dL    Comment: Performed at [facility], [address] [place], [place] [address]  Phosphorus     Status: None   Collection Time: 08/26/23  9:33 AM  Result Value Ref Range   Phosphorus 3.3 2.5 - 4.6 mg/dL    Comment: Performed at [facility], [address] [place], [place] [address]  Osmolality     Status: Abnormal   Collection Time: 08/26/23  9:33 AM  Result Value Ref Range   Osmolality 303 (H) 275 - 295 mOsm/kg    Comment: REPEATED TO VERIFY Performed at [facility], [address], [place], [place] [address]   Ammonia     Status: Abnormal   Collection Time: 08/26/23  9:33 AM  Result Value Ref Range   Ammonia 51 (H) 9 - 35 umol/L    Comment: Performed at [facility], [address] [place], [place] [address]  Glucose, capillary     Status: Abnormal   Collection Time: 08/26/23 10:22 AM  Result Value  Ref Range   Glucose-Capillary 118 (H) 70 - 99 mg/dL    Comment: Glucose reference range applies only to samples taken after fasting for at least 8 hours.  Basic metabolic panel     Status: Abnormal   Collection Time: 08/26/23 11:45 AM  Result Value Ref Range   Sodium 139 135 - 145 mmol/L   Potassium 4.1 3.5 - 5.1 mmol/L   Chloride 109 98 - 111 mmol/L   CO2 23 22 - 32 mmol/L   Glucose, Bld 129 (H) 70 - 99 mg/dL    Comment: Glucose reference range applies only to samples taken after fasting for at least 8 hours.   BUN 15 6 - 20 mg/dL   Creatinine, Ser 0.62 0.61 - 1.24 mg/dL   Calcium 8.4 (L) 8.9 - 10.3 mg/dL   GFR, Estimated >37 >62 mL/min    Comment: (NOTE) Calculated using the CKD-EPI Creatinine Equation (2021)    Anion gap 7 5 - 15    Comment: Performed at [facility], [address] [place], [place] [address]  Beta-hydroxybutyric acid     Status: Abnormal   Collection Time: 08/26/23 11:45 AM  Result Value Ref Range   Beta-Hydroxybutyric Acid 0.35 (H) 0.05 - 0.27 mmol/L    Comment: Performed at [facility], [address], [place], [place] [address]  Glucose, capillary  Status: Abnormal   Collection Time: 08/26/23 11:50 AM  Result Value Ref Range   Glucose-Capillary 180 (H) 70 - 99 mg/dL    Comment: Glucose reference range applies only to samples taken after fasting for at least 8 hours.  Glucose, capillary     Status: Abnormal   Collection Time: 08/26/23  3:41 PM  Result Value Ref Range   Glucose-Capillary 224 (H) 70 - 99 mg/dL    Comment: Glucose reference range applies only to samples taken after fasting for at least 8 hours.    MICRO: --------------- IMAGING: EEG adult Result Date: 08/26/2023 Charlsie Quest, MD     08/26/2023 10:22 AM Patient Name: Altariq Goodall MRN: [id number] Epilepsy Attending: Charlsie Quest Referring Physician/Provider: Rejeana Brock, MD Date: 08/26/2023 Duration: 08/26/2023 Patient history: 48 y.o. male who  presents with seizures in the setting of severe hyperglycemia. Level of alertness: Awake, asleep AEDs during EEG study: LEV Technical aspects: This EEG study was done with scalp electrodes positioned according to the 10-20 International system of electrode placement. Electrical activity was reviewed with band pass filter of 1-70Hz , sensitivity of 7 uV/mm, display speed of 2mm/sec with a 60Hz  notched filter applied as appropriate. EEG data were recorded continuously and digitally stored.  Video monitoring was available and reviewed as appropriate. Description: The posterior dominant rhythm consists of 8 Hz activity of moderate voltage (25-35 uV) seen predominantly in posterior head regions, symmetric and reactive to eye opening and eye closing. Sleep was characterized by vertex waves, sleep spindles (12 to 14 Hz), maximal frontocentral region. Hyperventilation and photic stimulation were not performed. IMPRESSION: This study is within normal limits. No seizures or epileptiform discharges were seen throughout the recording. A normal interictal EEG does not exclude the diagnosis of epilepsy.  Charlsie Quest   CT Head Wo Contrast Result Date: 08/25/2023 CLINICAL DATA:  ams seizure EXAM: CT HEAD WITHOUT CONTRAST TECHNIQUE: Contiguous axial images were obtained from the base of the skull through the vertex without intravenous contrast. RADIATION DOSE REDUCTION: This exam was performed according to the departmental dose-optimization program which includes automated exposure control, adjustment of the mA and/or kV according to patient size and/or use of iterative reconstruction technique. COMPARISON:  None Available. FINDINGS: Brain: No evidence of acute infarction, hemorrhage, hydrocephalus, extra-axial collection or mass lesion/mass effect. Vascular: No hyperdense vessel. Skull: No acute fracture. Sinuses/Orbits: Mostly clear sinuses.  No acute orbital findings. Other: No mastoid effusions. IMPRESSION: No evidence  of acute intracranial abnormality. Electronically Signed   By: Feliberto Harts M.D.   On: 08/25/2023 20:41   DG Chest Portable 1 View Result Date: 08/25/2023 CLINICAL DATA:  Hypoxia EXAM: PORTABLE CHEST 1 VIEW COMPARISON:  None Available. FINDINGS: The right inferior costophrenic angle is clipped off the edge of the film. Underinflation. Enlarged cardiopericardial silhouette. Mild perihilar opacities could relate to level of inflation and technique. Bronchovascular crowding. No separate consolidation, pneumothorax or effusion. IMPRESSION: Underinflation with enlarged heart and presumed bronchovascular crowding. Follow up study with improved inflation may be of some benefit however if there is further concern to better evaluate lung fields. The right inferior costophrenic angle is clipped off the edge of the film. Electronically Signed   By: Karen Kays M.D.   On: 08/25/2023 19:38    HISTORICAL MICRO/IMAGING  Assessment/Plan:  47yo TF with advanced hiv disease, poorly controlled, CD 4 count <50, previously on biktarvy. Admitted for new onset seizure in the setting of hyperosmolar hyperglycemia.  Hiv disease= once more alert  and able to swallow medications, plan to restart biktarvy. Will check VL and genotype Will also check other hiv labs such as toxo, rpr, crypto, and hep c abt  Oi proph = will start bactrim ds daily plus azithro 1200mg  weekly  Seizure = continue on keppra  T2DM = once off of insulin gtt defer to primary team to establish back onto diabetes management

## 2023-08-26 NOTE — Progress Notes (Signed)
 Pharmacy Antibiotic Note  Jeremiah Stevens is a 48 y.o. 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  Pharmacy assisted with amphotericin and flucytosine dosing.  Crypto Ag positive with titer 640. Unable to get LP tonight for rule out meningitis. ID on call recommending amphotericin and flucytosine. Magnesium level this morning 2, most recent potassium level 4.1 - no initial replacement indicated at this time.  Plan: -Start amphotericin B (Ambisome) 300 mg (capping dosing weight at 100 kg given obesity with BMI>42) every 24 hours -Start flucytosine 1750 mg (25 mg/kg using IBW) every 6 hours -F/u cx results, clinical pic, and ID recs - monitor daily BMET for K and Mag levels and replace as needed  Height: 5\' 7"  (170.2 cm) Weight: 119.5 kg (263 lb 7.2 oz) IBW/kg (Calculated) : 66.1  Temp (24hrs), Avg:97.2 F (36.2 C), Min:96 F (35.6 C), Max:98.4 F (36.9 C)  Recent Labs  Lab 08/25/23 1739 08/25/23 1920 08/26/23 0039 08/26/23 0328 08/26/23 0933 08/26/23 1145  WBC 3.5*  --   --   --  2.6*  --   CREATININE 1.79* 1.79*  --  1.01 0.99 1.04  LATICACIDVEN  --   --  1.7  --   --   --     Estimated Creatinine Clearance (by C-G formula based on SCr of 1.04 mg/dL) Male: 16.1 mL/min Male: 108.7 mL/min    No Known Allergies  Thank you for allowing pharmacy to participate in this patient's care,  Sherron Monday, PharmD, BCCCP Clinical Pharmacist  Phone: [phone] 08/26/2023 10:08 PM  Please check AMION for all [facility] Pharmacy phone numbers After 10:00 PM, call Main Pharmacy [phone]

## 2023-08-26 NOTE — Progress Notes (Signed)
 EEG complete - results pending

## 2023-08-26 NOTE — Procedures (Signed)
 Lumbar Puncture Procedure Note  Jeremiah Stevens  [id number]  June 24, 1976  Date:08/26/23  Time:9:56 PM   Provider Performing:Rumi Taras Allen Norris   Procedure: Lumbar Puncture (78295)  Indication(s) Rule out meningitis  Consent Risks of the procedure as well as the alternatives and risks of each were explained to the patient and/or caregiver.  Consent for the procedure was obtained and is signed in the bedside chart  Anesthesia Topical only with 1% lidocaine    Time Out Verified patient identification, verified procedure, site/side was marked, verified correct patient position, special equipment/implants available, medications/allergies/relevant history reviewed, required imaging and test results available.   Sterile Technique Maximal sterile technique including sterile barrier drape, hand hygiene, sterile gown, sterile gloves, mask, hair covering.    Procedure Description Using palpation, approximate location of L3-L4 space identified.   Lidocaine used to anesthetize skin and subcutaneous tissue overlying this area.  A 20g spinal needle was then used to access the subarachnoid space, multiple attempts but failed. The patient failed to relax even given Ativan 2 mg IV.   Complications/Tolerance The patient did not tolerate the procedure and kept extending back closing the space.    EBL Minimal   Specimen(s) CSF  Needs IR consult for LP

## 2023-08-26 NOTE — Telephone Encounter (Addendum)
 Patient Product/process development scientist completed.    The patient is insured through [employer]. Patient has [employer], may use a copay card, and/or apply for patient assistance if available.    Ran test claim for Biktarvy and the current 30 day co-pay is $3,791.96.  Ran test claim for Tivicay and the current 30 day co-pay is $2,091.25  Ran test claim for Descovy and Not on Formulary  This test claim was processed through [facility]- copay amounts may vary at other pharmacies due to pharmacy/plan contracts, or as the patient moves through the different stages of their insurance plan.     Roland Earl, CPHT Pharmacy Technician III Certified Patient Advocate [facility] Patient Advocate Team Direct Number: [phone]  Fax: [phone]

## 2023-08-26 NOTE — Procedures (Addendum)
 Patient Name: Dayne Chait  MRN: [id number]  Epilepsy Attending: Charlsie Quest  Referring Physician/Provider: Rejeana Brock, MD  Date: 08/26/2023 Duration: 08/26/2023   Patient history: 48 y.o. male who presents with seizures in the setting of severe hyperglycemia.   Level of alertness: Awake, asleep  AEDs during EEG study: LEV  Technical aspects: This EEG study was done with scalp electrodes positioned according to the 10-20 International system of electrode placement. Electrical activity was reviewed with band pass filter of 1-70Hz , sensitivity of 7 uV/mm, display speed of 75mm/sec with a 60Hz  notched filter applied as appropriate. EEG data were recorded continuously and digitally stored.  Video monitoring was available and reviewed as appropriate.  Description: The posterior dominant rhythm consists of 8 Hz activity of moderate voltage (25-35 uV) seen predominantly in posterior head regions, symmetric and reactive to eye opening and eye closing. Sleep was characterized by vertex waves, sleep spindles (12 to 14 Hz), maximal frontocentral region. Hyperventilation and photic stimulation were not performed.   IMPRESSION: This study is within normal limits. No seizures or epileptiform discharges were seen throughout the recording.  A normal interictal EEG does not exclude the diagnosis of epilepsy.    Donnisha Besecker Annabelle Harman

## 2023-08-26 NOTE — Progress Notes (Addendum)
 eLink Physician-Brief Progress Note Patient Name: Jonathyn Carothers DOB: 12-13-1975 MRN: [id number]   Date of Service  08/26/2023  HPI/Events of Note  48 y/o gentleman with a history of HIV not on ART, DM2 who presented with seizures and AMS. He was found to have severely elevated BG and was treated overnight with insulin infusion.   Notified by staff of cryptococcal antigen and serum was positive with titer 1:640.  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  I discussed the case with infectious disease on-call.  eICU Interventions  Will need a LP with opening pressures and diagnostic/therapeutic reduction in pressures to less than 20 cm H2O.  Requested ground team evaluation, if not possible tonight then will request IR tomorrow.  Start Amphotericin and flucytosine.  One-time Ativan 1 mg p.o. for MRI.   2247 -bedside lumbar puncture was attempted but patient was too encephalopathic and would like to use sedation and fluoroscopic LP.  Orders placed, maintain n.p.o. after midnight.  Patient was previously on an insulin drip and AmBisome will be associated with D5, may need to progress to insulin drip if he becomes hypoglycemic again.  Intervention Category Major Interventions: Infection - evaluation and management  Graig Hessling 08/26/2023, 9:02 PM

## 2023-08-26 NOTE — Progress Notes (Signed)
 NAME:  Jeremiah Stevens, MRN:  [id number], DOB:  09-09-1975, LOS: 1 ADMISSION DATE:  08/25/2023, CONSULTATION DATE:  08/25/23 REFERRING MD:  Eloise Harman CHIEF COMPLAINT:  AMS   History of Present Illness:  Pt is encephelopathic; therefore, this HPI is obtained from chart review. Jeremiah Stevens is a 48 y.o. male who has a PMH including but not limited to DM2 and HIV. He has not seen any providers in years and has been noncompliant with his medication regimen per his sister and brother. He lives at home with his brother.  He was in his normal state of health 2/11 and AM 2/12 when he at breakfast with his brother. Later that morning and into the afternoon, he was not his usual self and would have waxing and waning mental status along with mechanical falls. He progressed to the point of becoming minimally responsive and then had "clenched fingers and convulsions at home".  He does not have any prior hx of seizures.  He was brought to [facility] ED where he remained altered. Workup suggestive of HHS. He was started on fluids and insulin. He had head CT which was negative. CXR unremarkable.  Per brother and sister, he had not had any recent illness nor any complaints. Specifically denied any knowledge or recent fevers/chills/sweats, chest pain, cough, dyspnea, N/V/D, abd pain, myalgias, exposures to known sick contacts.  Given his AMS, PCCM asked to admit to ICU.  Pertinent  Medical History:  has Hyperosmolar hyperglycemic state (HHS) (HCC) on their problem list.   Significant Hospital Events: Including procedures, antibiotic start and stop dates in addition to other pertinent events   2/12: admitted for HHS, initial eval by neurology  2/13: improved mental status today, still confused. EEG negative. Pending MRI brain. Started on keppra. ID seeing   Interim History / Subjective:  Patient is awake, but drowsy. Oriented to self, city. Thinks is 2007. Follows commands. Admitted for HHS and started on  insulin gtt. Transitioned off today.   Objective:  Blood pressure (!) 141/105, pulse 93, temperature (!) 96 F (35.6 C), temperature source Axillary, resp. rate (!) 23, height 5\' 7"  (1.702 m), weight 119.5 kg, SpO2 96%.        Intake/Output Summary (Last 24 hours) at 08/26/2023 4098 Last data filed at 08/26/2023 0600 Gross per 24 hour  Intake 4462.52 ml  Output 300 ml  Net 4162.52 ml   Filed Weights   08/25/23 1933 08/25/23 2300  Weight: 122.5 kg 119.5 kg    Examination: General: Middle-age male, laying in bed, no acute distress Neuro: Awake, encephalopathic, oriented to self and city, disoriented to year HEENT: [place]/AT, anicteric sclera, dry mucous membranes, PERRLA Cardiovascular: S1-S2, no murmur, rub, gallop, sinus rhythm Lungs: 2 L nasal cannula, respirations even and unlabored, clear bilaterally Abdomen: Rounded, soft, nontender, nondistended Musculoskeletal: No lower extremity edema Skin: Warm and dry  Labs/imaging personally reviewed:  CT head 2/12 > neg CXR 2/12 > neg.  Assessment & Plan:  Hyperosmolar hyperglycemic state  Pseudohyponatremia Type 2 diabetes, untreated  Presenting glucose 686, serum osmolality 345. BHB 0.27.  Ha1c 11.7.  Not taking antidiabetics at home - transition off insulin drip today - ssi  - q4h cbg  - con't LR infusion  - needs DM education for follow-up and outpatient care  - trend bmp, osmolality   New onset seizures  Witnessed seizure at home, and then by EMS. Given 5mg  versed.  Likely seizure in the setting of HHS.  I do however have concerns  about other etiology of seizures such as drug withdrawal or CNS infection versus CNS lymphoma given AIDS diagnosis.  CT head with no acute findings.  4.5 g Keppra load. - 30-minute EEG - neurology following, appreciate recommendations - MRI brain - keppra 500 mg twice daily - seizure precautions - frequent neurochecks - ID following, appreciate input regarding CNS lymphoma etc.   Acute  metabolic encephalopathy  Multifactorial in the setting of HHS with possible post-ictal state. Ammonia 51. Also polysubstance use ? - lactulose 10mg  daily  - frequent neuro checks  - delirium precautions  - frequent neuro checks  AIDS  Per sister, not taking any ART therapy in years possibly. CD4 43 - f/u HIV 1/2 AB differentiaion  - ID consulted, appreciate recs regarding further work up - needs treatment in outpatient setting as well  - MR brain will help to identify possible CNS source of seizures outside of HHS  Polysubstance use disorder  Sister states that he uses "a lot of party drugs" and "uppers." She states he uses methamphetamines, cocaine. She is unsure about pill or opiate usage. Denies alcohol use.  UDS + amphetamines, benzos. Unclear if benzos from versed use from EMS or used recreationally. If he recreationally uses benzos, concern for also a source of withdrawal seizure.  - monitor for signs of withdrawal  - counsel   Best practice (evaluated daily):  Diet/type: Regular consistency (see orders) DVT prophylaxis: prophylactic heparin  Pressure ulcer(s): pressure ulcer assessment deferred  GI prophylaxis: N/A Lines: N/A Foley:  N/A Code Status:  full code Last date of multidisciplinary goals of care discussion: updated sister over the phone today   Labs   CBC: Recent Labs  Lab 08/25/23 1739  WBC 3.5*  NEUTROABS 2.7  HGB 13.5  HCT 39.4  MCV 86.0  PLT 166    Basic Metabolic Panel: Recent Labs  Lab 08/25/23 1739 08/25/23 1920 08/26/23 0328  NA 132* 132* 139  K 4.5 4.1 4.0  CL 93* 93* 111  CO2 20* 19* 22  GLUCOSE 686* 690* 140*  BUN 20 23* 16  CREATININE 1.79* 1.79* 1.01  CALCIUM 9.4 9.3 8.3*   GFR: Estimated Creatinine Clearance: 111.9 mL/min (by C-G formula based on SCr of 1.01 mg/dL). Recent Labs  Lab 08/25/23 1739 08/26/23 0039  WBC 3.5*  --   LATICACIDVEN  --  1.7    Liver Function Tests: Recent Labs  Lab 08/25/23 1739  AST 31   ALT 26  ALKPHOS 91  BILITOT 0.6  PROT 9.1*  ALBUMIN 3.1*   No results for input(s): "LIPASE", "AMYLASE" in the last 168 hours. No results for input(s): "AMMONIA" in the last 168 hours.  ABG    Component Value Date/Time   HCO3 21.6 08/25/2023 1920   ACIDBASEDEF 8.1 (H) 08/25/2023 1920   O2SAT 70.8 08/25/2023 1920     Coagulation Profile: No results for input(s): "INR", "PROTIME" in the last 168 hours.  Cardiac Enzymes: No results for input(s): "CKTOTAL", "CKMB", "CKMBINDEX", "TROPONINI" in the last 168 hours.  HbA1C: Hgb A1c MFr Bld  Date/Time Value Ref Range Status  08/26/2023 03:28 AM 11.7 (H) 4.8 - 5.6 % Final    Comment:    (NOTE) Pre diabetes:          5.7%-6.4%  Diabetes:              >6.4%  Glycemic control for   <7.0% adults with diabetes     CBG: Recent Labs  Lab 08/26/23 0142 08/26/23  7829 08/26/23 0407 08/26/23 0513 08/26/23 0717  GLUCAP 203* 139* 160* 144* 130*    Review of Systems:   As above  Past Medical History:  He,  has a past medical history of Cancer (HCC), Diabetes (HCC), HIV (human immunodeficiency virus infection) (HCC), and HTN (hypertension).   Surgical History:  Not available.  Social History:      Family History:  His family history is not on file.   Allergies No Known Allergies   Home Medications  Prior to Admission medications   Not on File     Critical care time: 35   Lenard Galloway [facility] 08/26/23 12:05 PM  Please see [email or web address] for pager details.  From 7A-7P if no response, please call [phone] After hours, please call ELink [phone]

## 2023-08-26 NOTE — Progress Notes (Signed)
 NEUROLOGY CONSULT FOLLOW UP NOTE   Date of service: August 26, 2023 Patient Name: Jeremiah Stevens MRN:  [id number] DOB:  May 29, 1976  Interval Hx/subjective   No acute events overnight.  EEG in room. Patient drowsy, but interactive and seems to become restless quickly.  Denies headache, generalized pain. Has been afebrile.   Vitals   Vitals:   08/26/23 0400 08/26/23 0500 08/26/23 0600 08/26/23 0700  BP: (!) 141/117 (!) 149/111 (!) 132/104 (!) 141/105  Pulse: 100 98 98 93  Resp: 19 16 (!) 25 (!) 23  Temp: (!) 97.5 F (36.4 C)   (!) 96 F (35.6 C)  TempSrc: Oral   Axillary  SpO2: 98% 98% 95% 96%  Weight:      Height:         Body mass index is 41.26 kg/m.  Physical Exam   Constitutional: Appears well-developed, but disheveled.  Psych: Cooperative, but restless quickly Eyes: No scleral injection.  HENT: No OP obstrucion.  Head: Normocephalic.  Cardiovascular: Normal rate and regular rhythm.  Respiratory: Effort normal, non-labored breathing.  GI: Soft.  No distension. There is no tenderness.  Skin: WDI.   Neurologic Examination   Drowsy, but does awaken to voice easily and is cooperative. At first, he was slightly disoriented but did correct himself as he awakened more.  VFF, tracks examiner thoroughly.  Follows commands and answers questions without any delay or confusion seen.  Spontaneously moves all extremities.  No overt ataxia noted. Neck ROM does not seem limited.   Medications  Current Facility-Administered Medications:    dextrose 5 % in lactated ringers infusion, , Intravenous, Continuous, Rondel Baton, MD, Last Rate: 125 mL/hr at 08/26/23 0600, Infusion Verify at 08/26/23 0600   dextrose 50 % solution 0-50 mL, 0-50 mL, Intravenous, PRN, Rondel Baton, MD   docusate sodium (COLACE) capsule 100 mg, 100 mg, Oral, BID PRN, Celine Mans, Rahul P, PA-C   heparin injection 5,000 Units, 5,000 Units, Subcutaneous, Q8H, Desai, Rahul P, PA-C, 5,000 Units  at 08/26/23 0527   insulin aspart (novoLOG) injection 2-6 Units, 2-6 Units, Subcutaneous, Q4H, Clark, Laura P, DO   insulin glargine-yfgn (SEMGLEE) injection 16 Units, 16 Units, Subcutaneous, Daily, Chestine Spore, Laura P, DO   insulin regular, human (MYXREDLIN) 100 units/ 100 mL infusion, , Intravenous, Continuous, Rondel Baton, MD, Last Rate: 1.6 mL/hr at 08/26/23 0600, 1.6 Units/hr at 08/26/23 0600   lactated ringers infusion, , Intravenous, Continuous, Rondel Baton, MD, Stopped at 08/25/23 2156   levETIRAcetam (KEPPRA) IVPB 1000 mg/100 mL premix, 1,000 mg, Intravenous, Q12H, Rejeana Brock, MD, Stopped at 08/26/23 0545   polyethylene glycol (MIRALAX / GLYCOLAX) packet 17 g, 17 g, Oral, Daily PRN, Kathlene Cote, PA-C  Labs and Diagnostic Imaging   CBC:  Recent Labs  Lab 08/25/23 1739  WBC 3.5*  NEUTROABS 2.7  HGB 13.5  HCT 39.4  MCV 86.0  PLT 166    Basic Metabolic Panel:  Lab Results  Component Value Date   NA 139 08/26/2023   K 4.0 08/26/2023   CO2 22 08/26/2023   GLUCOSE 140 (H) 08/26/2023   BUN 16 08/26/2023   CREATININE 1.01 08/26/2023   CALCIUM 8.3 (L) 08/26/2023   GFRNONAA >60 08/26/2023   Lipid Panel: No results found for: "LDLCALC" HgbA1c:  Lab Results  Component Value Date   HGBA1C 11.7 (H) 08/26/2023   Urine Drug Screen:     Component Value Date/Time   LABOPIA NONE DETECTED 08/25/2023 2100   COCAINSCRNUR  NONE DETECTED 08/25/2023 2100   LABBENZ POSITIVE (A) 08/25/2023 2100   AMPHETMU POSITIVE (A) 08/25/2023 2100   THCU NONE DETECTED 08/25/2023 2100   LABBARB NONE DETECTED 08/25/2023 2100    Alcohol Level     Component Value Date/Time   ETH <10 08/25/2023 1739   INR No results found for: "INR" APTT No results found for: "APTT" AED levels: No results found for: "PHENYTOIN", "ZONISAMIDE", "LAMOTRIGINE", "LEVETIRACETA"  CT Head without contrast(Personally reviewed): No evidence of acute intracranial abnormality.   MRI  Brain(Personally reviewed): PENDING  rEEG:  PENDING  Assessment   Jeremiah Stevens is a 48 y.o. male who presents with seizures in the setting of severe hyperglycemia.  Patient seems to be improving since his admission neurological assessment, as his blood glucose has been more controlled.   Will review results of EEG and MRI for abnormalities, but likely diagnosis is seizure secondary to severe hyperglycemia.   Recommendations   - MRI brain with and without  - EEG - Continue Keppra 500mg  BID - Continue glycemic and electrolyte management, as you are.  - Avoid deliriogenic medications as eble - Avoid dehydration  We will continue to follow.  ______________________________________________________________________   Pt seen by Neuro NP/APP and later by MD. Note/plan to be edited by MD as needed.    Lynnae January, DNP, AGACNP-BC [facility] Please use AMION for contact information & EPIC for messaging.   NEUROHOSPITALIST ADDENDUM Performed a face to face diagnostic evaluation.   I have reviewed the contents of history and physical exam as documented by PA/ARNP/Resident and agree with above documentation.  I have discussed and formulated the above plan as documented. Edits to the note have been made as needed.  Impression/Key exam findings/Plan: Seizure likely related to hyperglycemia but with his hx of HIV, we still want to get MRI Brain with and without contrast.  Routine EEG with no epileptiform discharges or seizures.  Continue Keppra for now but if MRI brain is non revealing, and hyperglycemia is corrected, we can stop.   Erick Blinks, MD [facility] [phone]   If 7pm to 7am, please call on call as listed on AMION.

## 2023-08-26 NOTE — Plan of Care (Signed)
   Problem: Clinical Measurements: Goal: Respiratory complications will improve Outcome: Progressing Goal: Cardiovascular complication will be avoided Outcome: Progressing   Problem: Activity: Goal: Risk for activity intolerance will decrease Outcome: Progressing

## 2023-08-26 NOTE — Inpatient Diabetes Management (Signed)
 Inpatient Diabetes Program Recommendations  AACE/ADA: New Consensus Statement on Inpatient Glycemic Control (2015)  Target Ranges:  Prepandial:   less than 140 mg/dL      Peak postprandial:   less than 180 mg/dL (1-2 hours)      Critically ill patients:  140 - 180 mg/dL   Lab Results  Component Value Date   GLUCAP 180 (H) 08/26/2023   HGBA1C 11.7 (H) 08/26/2023    Review of Glycemic Control  Latest Reference Range & Units 08/26/23 05:13 08/26/23 07:17 08/26/23 09:24 08/26/23 10:22 08/26/23 11:50  Glucose-Capillary 70 - 99 mg/dL 161 (H) 096 (H) 045 (H) 118 (H) 180 (H)  (H): Data is abnormally high Diabetes history: Type 2 DM Outpatient Diabetes medications: none Current orders for Inpatient glycemic control: IV insulin to transition to Semglee 16 units every day, Novolog 2-6 units Q4H  Inpatient Diabetes Program Recommendations:    Attempted to speak with patient for DM management. Patient sleeping and slurring inappropriately. Will have DM team follow up when more appropriate.   Thanks, Lujean Rave, MSN, RNC-OB Diabetes Coordinator [phone] (8a-5p)

## 2023-08-27 ENCOUNTER — Other Ambulatory Visit: Payer: Self-pay

## 2023-08-27 ENCOUNTER — Inpatient Hospital Stay (HOSPITAL_COMMUNITY): Payer: Medicaid Other

## 2023-08-27 DIAGNOSIS — G9340 Encephalopathy, unspecified: Secondary | ICD-10-CM | POA: Diagnosis not present

## 2023-08-27 DIAGNOSIS — E7220 Disorder of urea cycle metabolism, unspecified: Secondary | ICD-10-CM | POA: Diagnosis not present

## 2023-08-27 DIAGNOSIS — E119 Type 2 diabetes mellitus without complications: Secondary | ICD-10-CM

## 2023-08-27 DIAGNOSIS — R569 Unspecified convulsions: Secondary | ICD-10-CM | POA: Diagnosis not present

## 2023-08-27 DIAGNOSIS — B20 Human immunodeficiency virus [HIV] disease: Secondary | ICD-10-CM | POA: Diagnosis not present

## 2023-08-27 DIAGNOSIS — G9341 Metabolic encephalopathy: Secondary | ICD-10-CM | POA: Diagnosis not present

## 2023-08-27 DIAGNOSIS — Z794 Long term (current) use of insulin: Secondary | ICD-10-CM

## 2023-08-27 DIAGNOSIS — B459 Cryptococcosis, unspecified: Secondary | ICD-10-CM | POA: Diagnosis not present

## 2023-08-27 DIAGNOSIS — E1100 Type 2 diabetes mellitus with hyperosmolarity without nonketotic hyperglycemic-hyperosmolar coma (NKHHC): Secondary | ICD-10-CM | POA: Diagnosis not present

## 2023-08-27 DIAGNOSIS — D61818 Other pancytopenia: Secondary | ICD-10-CM

## 2023-08-27 DIAGNOSIS — J9601 Acute respiratory failure with hypoxia: Secondary | ICD-10-CM | POA: Diagnosis not present

## 2023-08-27 LAB — BASIC METABOLIC PANEL
Anion gap: 12 (ref 5–15)
BUN: 12 mg/dL (ref 6–20)
CO2: 24 mmol/L (ref 22–32)
Calcium: 8.9 mg/dL (ref 8.9–10.3)
Chloride: 104 mmol/L (ref 98–111)
Creatinine, Ser: 1.01 mg/dL (ref 0.61–1.24)
GFR, Estimated: >60 mL/min (ref >60)
Glucose, Bld: 98 mg/dL (ref 70–99)
Potassium: 4 mmol/L (ref 3.5–5.1)
Sodium: 140 mmol/L (ref 135–145)

## 2023-08-27 LAB — GLUCOSE, CAPILLARY
Glucose-Capillary: 86 mg/dL (ref 70–99)
Glucose-Capillary: 97 mg/dL (ref 70–99)
Glucose-Capillary: 147 mg/dL — ABNORMAL HIGH (ref 70–99)
Glucose-Capillary: 114 mg/dL — ABNORMAL HIGH (ref 70–99)
Glucose-Capillary: 122 mg/dL — ABNORMAL HIGH (ref 70–99)
Glucose-Capillary: 135 mg/dL — ABNORMAL HIGH (ref 70–99)
Glucose-Capillary: 145 mg/dL — ABNORMAL HIGH (ref 70–99)

## 2023-08-27 LAB — TOXOPLASMA ANTIBODIES- IGG AND  IGM
Toxoplasma Antibody- IgM: <3 [AU]/ml (ref 0.0–7.9)
Toxoplasma IgG Ratio: <3 [IU]/mL (ref 0.0–7.1)

## 2023-08-27 LAB — MENINGITIS/ENCEPHALITIS PANEL (CSF)

## 2023-08-27 LAB — CSF CELL COUNT WITH DIFFERENTIAL
RBC Count, CSF: 19 /mm3 — ABNORMAL HIGH
Tube #: 3
WBC, CSF: 2 /mm3 (ref 0–5)

## 2023-08-27 LAB — RPR: RPR Ser Ql: NONREACTIVE

## 2023-08-27 LAB — CBC
HCT: 37.1 % — ABNORMAL LOW (ref 39.0–52.0)
Hemoglobin: 12.4 g/dL — ABNORMAL LOW (ref 13.0–17.0)
MCH: 29.1 pg (ref 26.0–34.0)
MCHC: 33.4 g/dL (ref 30.0–36.0)
MCV: 87.1 fL (ref 80.0–100.0)
Platelets: 132 10*3/uL — ABNORMAL LOW (ref 150–400)
RBC: 4.26 MIL/uL (ref 4.22–5.81)
RDW: 13.8 % (ref 11.5–15.5)
WBC: 2.4 10*3/uL — ABNORMAL LOW (ref 4.0–10.5)
nRBC: 0 % (ref 0.0–0.2)

## 2023-08-27 LAB — PROTEIN AND GLUCOSE, CSF
Glucose, CSF: 55 mg/dL (ref 40–70)
Total  Protein, CSF: 71 mg/dL — ABNORMAL HIGH (ref 15–45)

## 2023-08-27 LAB — LACTATE DEHYDROGENASE: LDH: 191 U/L (ref 98–192)

## 2023-08-27 LAB — HIV-1/2 AB - DIFFERENTIATION
HIV 1 Ab: REACTIVE — AB
HIV 2 Ab: NONREACTIVE

## 2023-08-27 LAB — MAGNESIUM: Magnesium: 1.9 mg/dL (ref 1.7–2.4)

## 2023-08-27 LAB — CRYPTOCOCCAL ANTIGEN
Crypto Ag: POSITIVE — AB
Cryptococcal Ag Titer: 80 — AB

## 2023-08-27 LAB — CRYPTOCOCCAL ANTIGEN, CSF: Crypto Ag: NEGATIVE

## 2023-08-27 MED ORDER — LIDOCAINE 1 % OPTIME INJ - NO CHARGE
5.0000 mL | Freq: Once | INTRAMUSCULAR | Status: AC
Start: 2023-08-27 — End: 2023-08-27
  Administered 2023-08-27: 5 mL via INTRADERMAL
  Filled 2023-08-27: qty 6

## 2023-08-27 MED ORDER — MIDAZOLAM HCL 2 MG/2ML IJ SOLN
4.0000 mg | Freq: Once | INTRAMUSCULAR | Status: AC
  Administered 2023-08-27: 4 mg via INTRAVENOUS
  Filled 2023-08-27: qty 4

## 2023-08-27 MED ORDER — SODIUM CHLORIDE 0.9% FLUSH: 10.0000 mL | INTRAVENOUS | Status: DC | PRN

## 2023-08-27 MED ORDER — HYDRALAZINE HCL 20 MG/ML IJ SOLN
10.0000 mg | INTRAMUSCULAR | Status: DC | PRN
  Administered 2023-08-27 – 2023-08-28 (×2): 10 mg via INTRAVENOUS
  Filled 2023-08-27 (×2): qty 1

## 2023-08-27 MED ORDER — ORAL CARE MOUTH RINSE: 15.0000 mL | OROMUCOSAL | Status: DC | PRN

## 2023-08-27 MED ORDER — FENTANYL CITRATE PF 50 MCG/ML IJ SOSY
100.0000 ug | PREFILLED_SYRINGE | Freq: Once | INTRAMUSCULAR | Status: AC
  Administered 2023-08-27: 100 ug via INTRAVENOUS
  Filled 2023-08-27: qty 2

## 2023-08-27 MED ORDER — PROSOURCE TF20 ENFIT COMPATIBL EN LIQD
60.0000 mL | Freq: Every day | ENTERAL | Status: DC
  Administered 2023-08-27: 60 mL
  Filled 2023-08-27 (×2): qty 60

## 2023-08-27 MED ORDER — ORAL CARE MOUTH RINSE
15.0000 mL | OROMUCOSAL | Status: DC
  Administered 2023-08-27 – 2023-09-02 (×19): 15 mL via OROMUCOSAL

## 2023-08-27 MED ORDER — SODIUM CHLORIDE 0.9% FLUSH
10.0000 mL | Freq: Two times a day (BID) | INTRAVENOUS | Status: DC
  Administered 2023-08-27 – 2023-08-28 (×2): 10 mL

## 2023-08-27 MED ORDER — SODIUM CHLORIDE 0.9 % IV SOLN: INTRAVENOUS | Status: AC | PRN

## 2023-08-27 MED ORDER — OSMOLITE 1.5 CAL PO LIQD
1000.0000 mL | ORAL | Status: DC
  Administered 2023-08-27: 1000 mL

## 2023-08-27 MED ORDER — GADOBUTROL 1 MMOL/ML IV SOLN
10.0000 mL | Freq: Once | INTRAVENOUS | Status: AC | PRN
  Administered 2023-08-27: 10 mL via INTRAVENOUS

## 2023-08-27 MED ORDER — POTASSIUM CHLORIDE CRYS ER 20 MEQ PO TBCR
20.0000 meq | EXTENDED_RELEASE_TABLET | Freq: Once | ORAL | Status: AC
  Administered 2023-08-27: 20 meq via ORAL
  Filled 2023-08-27: qty 1

## 2023-08-27 MED ORDER — DEXTROSE 5 % IV SOLN
350.0000 mg | INTRAVENOUS | Status: DC
  Administered 2023-08-28 – 2023-08-30 (×3): 350 mg via INTRAVENOUS
  Filled 2023-08-27: qty 50
  Filled 2023-08-27 (×2): qty 75

## 2023-08-27 MED ORDER — AZITHROMYCIN 600 MG PO TABS
1200.0000 mg | ORAL_TABLET | ORAL | Status: DC
  Administered 2023-08-28: 1200 mg via ORAL
  Filled 2023-08-27 (×2): qty 2

## 2023-08-27 MED ORDER — MAGNESIUM SULFATE 4 GM/100ML IV SOLN
4.0000 g | Freq: Once | INTRAVENOUS | Status: AC
  Administered 2023-08-27: 4 g via INTRAVENOUS
  Filled 2023-08-27: qty 100

## 2023-08-27 NOTE — Progress Notes (Signed)
 Initial Nutrition Assessment  DOCUMENTATION CODES:   Obesity unspecified  INTERVENTION:   Tube Feeding via Cortrak:  Osmolite 1.5 at 60 ml/hr Titrate by 10 mL q 8 hours until goal rate of 60 ml/hr Pro-Source TF20 60 mL daily TF at goal rate provides 2240 kcals, 110 g of protein and 1094 mL of water  Add Thiamine  NUTRITION DIAGNOSIS:   Inadequate oral intake related to acute illness as evidenced by NPO status.  GOAL:   Patient will meet greater than or equal to 90% of their needs   MONITOR:   Diet advancement, TF tolerance, Weight trends, Labs, Skin  REASON FOR ASSESSMENT:   Rounds    ASSESSMENT:   48 yo admitted with hyperosmolar hyperglycemia, acute metabolic encephalopathy, new onset seizures, cryptococcal infection. PMH includes Type 2 DM, HIV with possible AIDS.  2/12 Admitted  +cryptococcal infection-MRI brain today, possible meningitis  Cortrak placed, NPO. Noted mental status has waxed and waned; currently pt sedated, does not even open eyes during my assessment but noted pt has been agitated and other times, appropriate  Unable to obtain diet and weight history  No documented BM, abdomen soft, +flatus  Noted pt with hx of substance use; sister states pt uses a lot of party drugs/uppers including cocaine and meth  UDS + amphetamines, benzos  Labs: sodium 140 (wdl), potassium 4.0 (wdl), CBGs 86-224, HgbA1c  Meds: ss novolog, semglee, lactulose   Diet Order:   Diet Order             Diet clear liquid Room service appropriate? Yes; Fluid consistency: Thin  Diet effective now                   EDUCATION NEEDS:   Education needs have been addressed  Skin:  Skin Assessment: Skin Integrity Issues: Skin Integrity Issues:: Other (Comment) Other: non pressure wound  Last BM:  no documented BM  Height:   Ht Readings from Last 1 Encounters:  08/25/23 5\' 7"  (1.702 m)    Weight:   Wt Readings from Last 1 Encounters:  08/27/23 121.5 kg      BMI:  Body mass index is 41.95 kg/m.  Estimated Nutritional Needs:   Kcal:  2000-2200 kcals  Protein:  100-115 g  Fluid:  >/= 2L   Romelle Starcher MS, RDN, LDN, CNSC Registered Dietitian 3 Clinical Nutrition RD Inpatient Contact Info in Amion

## 2023-08-27 NOTE — Progress Notes (Addendum)
 eLink Physician-Brief Progress Note Patient Name: Jeremiah Stevens DOB: 1975/09/02 MRN: [id number]   Date of Service  08/27/2023  HPI/Events of Note  No procedures planned, mentation  improving, passed swallow eval  eICU Interventions  Clear liquid diet advanced to Heart Healthy   2207 - has had intermittent confusion.  Currently, favor conservative management, continue observation without medications.  8295 -updated insulin orders to avoid starting insulin drip at this time.  Continue sliding scale insulin  Intervention Category Minor Interventions: Routine modifications to care plan (e.g. PRN medications for pain, fever)  Marielis Samara 08/27/2023, 8:29 PM

## 2023-08-27 NOTE — Progress Notes (Addendum)
 Date and time results received: 08/27/23 1825 (use smartphrase ".now" to insert current time)  Test: Cryptococcal antigen Critical Value: 51  Name of Provider Notified: Mertha Baars, PA and Dr. Drue Second  Orders Received? Or Actions Taken?: Expected values, numbers are coming down.

## 2023-08-27 NOTE — Inpatient Diabetes Management (Signed)
 Inpatient Diabetes Program Recommendations  AACE/ADA: New Consensus Statement on Inpatient Glycemic Control (2015)  Target Ranges:  Prepandial:   less than 140 mg/dL      Peak postprandial:   less than 180 mg/dL (1-2 hours)      Critically ill patients:  140 - 180 mg/dL   Lab Results  Component Value Date   GLUCAP 114 (H) 08/27/2023   HGBA1C 11.7 (H) 08/26/2023    Review of Glycemic Control  Latest Reference Range & Units 08/27/23 01:12 08/27/23 04:04 08/27/23 07:31  Glucose-Capillary 70 - 99 mg/dL 97 161 (H) 096 (H)   Diabetes history: DM 2 Outpatient Diabetes medications:  None listed (however patient was on insulin previously based on Chart merger) Current orders for Inpatient glycemic control:  Semglee 16 units daily Novolog 2-6 units q 4 hours  Inpatient Diabetes Program Recommendations:    Agree with current orders.  Patient will need to resume insulin at discharge.  Currently not appropriate for education.  Will follow-up when appropriate.    Thanks,  Lorenza Cambridge, RN, BC-ADM Inpatient Diabetes Coordinator Pager [phone]  (8a-5p)

## 2023-08-27 NOTE — Plan of Care (Signed)
  Problem: Health Behavior/Discharge Planning: Goal: Ability to manage health-related needs will improve Outcome: Progressing   Problem: Clinical Measurements: Goal: Ability to maintain clinical measurements within normal limits will improve Outcome: Progressing   Problem: Pain Managment: Goal: General experience of comfort will improve and/or be controlled Outcome: Progressing   Problem: Safety: Goal: Ability to remain free from injury will improve Outcome: Progressing

## 2023-08-27 NOTE — Progress Notes (Signed)
 Peripherally Inserted Central Catheter Placement  The IV Nurse has discussed with the patient and/or persons authorized to consent for the patient, the purpose of this procedure and the potential benefits and risks involved with this procedure.  The benefits include less needle sticks, lab draws from the catheter, and the patient may be discharged home with the catheter. Risks include, but not limited to, infection, bleeding, blood clot (thrombus formation), and puncture of an artery; nerve damage and irregular heartbeat and possibility to perform a PICC exchange if needed/ordered by physician.  Alternatives to this procedure were also discussed.  Bard Power PICC patient education guide, fact sheet on infection prevention and patient information card has been provided to patient /or left at bedside.    PICC Placement Documentation  PICC Double Lumen 08/27/23 Right Basilic 47 cm 1 cm (Active)  Indication for Insertion or Continuance of Line Prolonged intravenous therapies 08/27/23 1633  Exposed Catheter (cm) 1 cm 08/27/23 1633  Site Assessment Clean, Dry, Intact 08/27/23 1633  Lumen #1 Status Flushed;Saline locked;Blood return noted 08/27/23 1633  Lumen #2 Status Flushed;Saline locked;Blood return noted 08/27/23 1633  Dressing Type Transparent;Securing device 08/27/23 1633  Dressing Status Antimicrobial disc/dressing in place;Clean, Dry, Intact 08/27/23 1633  Line Care Connections checked and tightened 08/27/23 1633  Line Adjustment (NICU/IV Team Only) No 08/27/23 1633  Dressing Intervention New dressing;Adhesive placed at insertion site (IV team only);Other (Comment) 08/27/23 1633  Dressing Change Due 09/03/23 08/27/23 1633    Patient's sister, Rajinder Mesick, signed PICC consent via telephone. Verified by 2 PICC RNs.   Annett Fabian 08/27/2023, 4:49 PM

## 2023-08-27 NOTE — Progress Notes (Signed)
 NAME:  Jeremiah Stevens, MRN:  [id number], DOB:  June 26, 1976, LOS: 2 ADMISSION DATE:  08/25/2023, CONSULTATION DATE:  08/25/23 REFERRING MD:  Eloise Harman CHIEF COMPLAINT:  AMS   History of Present Illness:  Pt is encephelopathic; therefore, this HPI is obtained from chart review. Jeremiah Stevens is a 48 y.o. adult who has a PMH including but not limited to DM2 and HIV. He has not seen any providers in years and has been noncompliant with his medication regimen per his sister and brother. He lives at home with his brother.  He was in his normal state of health 2/11 and AM 2/12 when he at breakfast with his brother. Later that morning and into the afternoon, he was not his usual self and would have waxing and waning mental status along with mechanical falls. He progressed to the point of becoming minimally responsive and then had "clenched fingers and convulsions at home".  He does not have any prior hx of seizures.  He was brought to [facility] ED where he remained altered. Workup suggestive of HHS. He was started on fluids and insulin. He had head CT which was negative. CXR unremarkable.  Per brother and sister, he had not had any recent illness nor any complaints. Specifically denied any knowledge or recent fevers/chills/sweats, chest pain, cough, dyspnea, N/V/D, abd pain, myalgias, exposures to known sick contacts.  Given his AMS, PCCM asked to admit to ICU.  Pertinent  Medical History:  has Hyperosmolar hyperglycemic state (HHS) (HCC); Seizure (HCC); Acute respiratory failure with hypoxia (HCC); Acute encephalopathy; Hyperammonemia (HCC); and Pancytopenia (HCC) on their problem list.   Significant Hospital Events: Including procedures, antibiotic start and stop dates in addition to other pertinent events   2/12: admitted for HHS, initial eval by neurology  2/13: improved mental status today, still confused. EEG negative. Pending MRI brain. Started on keppra. ID seeing  2/14: cryptococcal  antigen highly positive. 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 Coverage started. MRI today and then IR for LP.   Interim History / Subjective:  Patient is awake, but drowsy. Oriented to self, city. Waxes and wanes on different years. Concern for meningitis after cryptococcal antigen came back +. LP was attempted at bedside overnight but with 2mg  ativan patient still would not sit still for safe puncture. Going to MRI and LP with opening pressures.    Objective:  Blood pressure (!) 144/88, pulse 93, temperature (!) 96.5 F (35.8 C), temperature source Axillary, resp. rate 16, height 5\' 7"  (1.702 m), weight 121.5 kg, SpO2 96%.        Intake/Output Summary (Last 24 hours) at 08/27/2023 1145 Last data filed at 08/27/2023 0800 Gross per 24 hour  Intake 3963.63 ml  Output 2835 ml  Net 1128.63 ml   Filed Weights   08/25/23 1933 08/25/23 2300 08/27/23 0446  Weight: 122.5 kg 119.5 kg 121.5 kg    Examination: General: Middle-age male, laying in bed, no acute distress, confused  Neuro: drowsy, encephalopathic, oriented to self and city, disoriented to year HEENT: [place]/AT, anicteric sclera, dry mucous membranes, PERRLA Cardiovascular: S1-S2, no murmur, rub, gallop, sinus rhythm Lungs: 2 L nasal cannula, respirations even and unlabored, clear bilaterally Abdomen: Rounded, soft, nontender, nondistended Musculoskeletal: No lower extremity edema Skin: Warm and dry  Labs/imaging personally reviewed:  CT head 2/12 > neg CXR 2/12 > neg.  Assessment & Plan:  Hyperosmolar hyperglycemic state, resolved  Pseudohyponatremia, resolved  Type 2 diabetes, untreated  Presenting glucose 686, serum osmolality 345. BHB 0.27.  Ha1c 11.7.  Not taking antidiabetics at home.  - ssi  - q4h cbg  - needs DM education for follow-up and outpatient care   Acute metabolic encephalopathy  New onset seizures  Cryptococcal infection  Witnessed seizure at home, and then by EMS. Given 5mg  versed.  Initial concern was for  seizure r/t HHS. Given ongoing concern for alternate etiologies, further labs drawn, MRI. His cryptococcal antigen is very positive. Concern for systemic/neuro cryptococcal infection and meningitis. 2/14 early morning LP was attempted at bedside without success. Started on coverage. 36m EEG negative for seizure activity.  - neurology following, appreciate recommendations - MRI brain ASAP  - cortrak today - IR for LP today - need opening pressure - will need serial LP if +crypto meningitis to prevent herniation syndrome  - continue amphotericin B and flucytosine  - keppra 500 mg twice daily - lactulose 10mg  daily - seizure precautions - frequent neurochecks - ID following, appreciate input   AIDS  +Crypto, opportunistic infection Per sister, not taking any ART therapy in years possibly. CD4 43. 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 Toxoplasma negative. Cryptococcus positive - ID consulted, appreciate recs regarding further work up - cryptococcus + as above, on treatment  - start bactrim  - see above for crypto work up - needs treatment in outpatient setting as well  - MRI brain  Polysubstance use disorder  Sister states that he uses "a lot of party drugs" and "uppers." She states he uses methamphetamines, cocaine. She is unsure about pill or opiate usage. Denies alcohol use.  UDS + amphetamines, benzos. Unclear if benzos from versed use from EMS or used recreationally. If he recreationally uses benzos, concern for also a source of withdrawal seizure.  - monitor for signs of withdrawal  - counsel   Best practice (evaluated daily):  Diet/type: NPO TF DVT prophylaxis: prophylactic heparin  Pressure ulcer(s): pressure ulcer assessment deferred  GI prophylaxis: N/A Lines: N/A Foley:  N/A Code Status:  full code Last date of multidisciplinary goals of care discussion: updated sister over the phone 2/13  Labs   CBC: Recent Labs  Lab 08/25/23 1739 08/26/23 0933 08/27/23 0226  WBC 3.5* 2.6* 2.4*   NEUTROABS 2.7  --   --   HGB 13.5 11.6* 12.4*  HCT 39.4 33.3* 37.1*  MCV 86.0 85.2 87.1  PLT 166 123* 132*    Basic Metabolic Panel: Recent Labs  Lab 08/25/23 1920 08/26/23 0328 08/26/23 0933 08/26/23 1145 08/27/23 0226  NA 132* 139 142 139 140  K 4.1 4.0 3.7 4.1 4.0  CL 93* 111 110 109 104  CO2 19* 22 25 23 24   GLUCOSE 690* 140* 95 129* 98  BUN 23* 16 14 15 12   CREATININE 1.79* 1.01 0.99 1.04 1.01  CALCIUM 9.3 8.3* 8.4* 8.4* 8.9  MG  --   --  2.0  --  1.9  PHOS  --   --  3.3  --   --    GFR: Estimated Creatinine Clearance (by C-G formula based on SCr of 1.01 mg/dL) Male: 40.9 mL/min Male: 112.9 mL/min Recent Labs  Lab 08/25/23 1739 08/26/23 0039 08/26/23 0933 08/27/23 0226  WBC 3.5*  --  2.6* 2.4*  LATICACIDVEN  --  1.7  --   --     Liver Function Tests: Recent Labs  Lab 08/25/23 1739  AST 31  ALT 26  ALKPHOS 91  BILITOT 0.6  PROT 9.1*  ALBUMIN 3.1*   No results for input(s): "LIPASE", "AMYLASE" in the last 168 hours. Recent Labs  Lab 08/26/23 0933  AMMONIA 51*    ABG    Component Value Date/Time   HCO3 26.6 08/26/2023 0931   ACIDBASEDEF 8.1 (H) 08/25/2023 1920   O2SAT 98.9 08/26/2023 0931     Coagulation Profile: No results for input(s): "INR", "PROTIME" in the last 168 hours.  Cardiac Enzymes: No results for input(s): "CKTOTAL", "CKMB", "CKMBINDEX", "TROPONINI" in the last 168 hours.  HbA1C: Hgb A1c MFr Bld  Date/Time Value Ref Range Status  08/26/2023 03:28 AM 11.7 (H) 4.8 - 5.6 % Final    Comment:    (NOTE) Pre diabetes:          5.7%-6.4%  Diabetes:              >6.4%  Glycemic control for   <7.0% adults with diabetes     CBG: Recent Labs  Lab 08/26/23 2015 08/27/23 0110 08/27/23 0112 08/27/23 0404 08/27/23 0731  GLUCAP 219* 86 97 147* 114*    Review of Systems:   As above  Past Medical History:  She,  has a past medical history of Cancer (HCC), Diabetes (HCC), HIV (human immunodeficiency virus infection)  (HCC), and HTN (hypertension).   Surgical History:  Not available.  Social History:      Family History:  Her family history is not on file.   Allergies No Known Allergies   Home Medications  Prior to Admission medications   Not on File     Critical care time: 35   Lenard Galloway [facility] 08/27/23 11:45 AM  Please see [email or web address] for pager details.  From 7A-7P if no response, please call [phone] After hours, please call ELink [phone]

## 2023-08-27 NOTE — Procedures (Signed)
 Cortrak  Tube Type:  Cortrak - 43 inches Tube Location:  Right nare Initial Placement:  Stomach Secured by: Bridle Technique Used to Measure Tube Placement:  Marking at nare/corner of mouth Cortrak Secured At:  70 cm   Cortrak Tube Team Note:  Consult received to place a Cortrak feeding tube.   No x-ray is required. RN may begin using tube.   If the tube becomes dislodged please keep the tube and contact the Cortrak team at [email or web address] for replacement.  If after hours and replacement cannot be delayed, place a NG tube and confirm placement with an abdominal x-ray.    Betsey Holiday MS, RD, LDN If unable to be reached, please send secure chat to "RD inpatient" available from 8:00a-4:00p daily

## 2023-08-27 NOTE — Plan of Care (Signed)

## 2023-08-27 NOTE — Procedures (Signed)
 PROCEDURE SUMMARY:  Successful fluoroscopic guided lumbar puncture. No immediate complications.  Pt tolerated well.   EBL = none  Please see full dictation in imaging section of Epic for procedure details.

## 2023-08-27 NOTE — Progress Notes (Signed)
 Attempted to obtain telephone consent from [facility], patient's sister with no success x 3. Not able to reach at this time.RN aware.

## 2023-08-27 NOTE — Progress Notes (Signed)
 NEUROLOGY CONSULT FOLLOW UP NOTE   Date of service: August 27, 2023 Patient Name: Jeremiah Stevens MRN:  [id number] DOB:  1976/05/18  Interval Hx/subjective   Unable to get LP last night due to patient agitation.  Serum Cryptococcus Ag is positive. ID following and is now on induction therapy. Overall, neuro exam is improved.   Vitals   Vitals:   08/27/23 0500 08/27/23 0600 08/27/23 0700 08/27/23 0800  BP:   (!) 156/128 (!) 144/88  Pulse: 100 97 95 93  Resp: (!) 24 17 (!) 24 16  Temp:   (!) 96.5 F (35.8 C)   TempSrc:   Axillary   SpO2: 93% 99% 95% 96%  Weight:      Height:         Body mass index is 41.95 kg/m.  Physical Exam   Constitutional: Appears well-developed, but disheveled.  Psych: Cooperative, but restless quickly Eyes: No scleral injection.  HENT: No OP obstrucion.  Head: Normocephalic.  Cardiovascular: Normal rate and regular rhythm.  Respiratory: Effort normal, non-labored breathing.  GI: Soft.  No distension. There is no tenderness.  Skin: WDI.   Neurologic Examination   Drowsy, but does awaken to voice easily and is cooperative. Orientation and attention is improved today, but still poor attention and concentration.  VFF, tracks examiner thoroughly.  Follows commands and answers questions without any delay or confusion seen.  Spontaneously moves all extremities.  No overt ataxia noted. Neck ROM does not seem limited.   Medications  Current Facility-Administered Medications:    acetaminophen (TYLENOL) tablet 650 mg, 650 mg, Oral, Daily PRN, Paliwal, Aditya, MD   amphotericin B liposome (AMBISOME) 300 mg in dextrose 5 % 500 mL IVPB, 300 mg, Intravenous, Q24H, Paliwal, Aditya, MD, Stopped at 08/27/23 0440   Chlorhexidine Gluconate Cloth 2 % PADS 6 each, 6 each, Topical, Daily, Karie Fetch P, DO   dextrose 5 % 10 mL, 10 mL, Intravenous, Q24H, Paliwal, Aditya, MD, 10 mL at 08/27/23 0444   dextrose 5 % 10 mL, 10 mL, Intravenous, Q24H, Paliwal,  Aditya, MD   dextrose 50 % solution 0-50 mL, 0-50 mL, Intravenous, PRN, Rondel Baton, MD   diphenhydrAMINE (BENADRYL) injection 25 mg, 25 mg, Intravenous, Daily PRN **OR** diphenhydrAMINE (BENADRYL) capsule 25 mg, 25 mg, Oral, Daily PRN, Paliwal, Aditya, MD   docusate sodium (COLACE) capsule 100 mg, 100 mg, Oral, BID PRN, Celine Mans, Rahul P, PA-C   fentaNYL (SUBLIMAZE) injection 100 mcg, 100 mcg, Intravenous, Once, Autry, Lauren E, PA-C   flucytosine (ANCOBON) capsule 2,000 mg, 2,000 mg, Oral, Q6H, Arabella Merles, RPH, 2,000 mg at 08/27/23 0535   heparin injection 5,000 Units, 5,000 Units, Subcutaneous, Q8H, Desai, Rahul P, PA-C, 5,000 Units at 08/27/23 0533   hydrALAZINE (APRESOLINE) injection 10 mg, 10 mg, Intravenous, Q4H PRN, Paliwal, Aditya, MD   insulin aspart (novoLOG) injection 2-6 Units, 2-6 Units, Subcutaneous, Q4H, Steffanie Dunn, DO, 2 Units at 08/27/23 0408   insulin glargine-yfgn (SEMGLEE) injection 16 Units, 16 Units, Subcutaneous, Daily, Steffanie Dunn, DO, 16 Units at 08/26/23 0938   lactulose (CHRONULAC) 10 GM/15ML solution 10 g, 10 g, Oral, Daily, Autry, Lauren E, PA-C, 10 g at 08/26/23 1600   levETIRAcetam (KEPPRA) IVPB 1000 mg/100 mL premix, 1,000 mg, Intravenous, Q12H, Rejeana Brock, MD, Stopped at 08/27/23 2130   LORazepam (ATIVAN) tablet 1 mg, 1 mg, Oral, Once PRN, Paliwal, Aditya, MD   meperidine (DEMEROL) injection 25 mg, 25 mg, Intravenous, Q15 min PRN, Conrad [place], MD  midazolam (VERSED) injection 4 mg, 4 mg, Intravenous, Once, Cristopher Peru, PA-C   Oral care mouth rinse, 15 mL, Mouth Rinse, 4 times per day, Steffanie Dunn, DO   Oral care mouth rinse, 15 mL, Mouth Rinse, PRN, Karie Fetch P, DO   polyethylene glycol (MIRALAX / GLYCOLAX) packet 17 g, 17 g, Oral, Daily PRN, Celine Mans, Rahul P, PA-C   sodium chloride 0.9 % bolus 500 mL, 500 mL, Intravenous, Q24H, Paliwal, Aditya, MD, Infusion Verify at 08/27/23 0800   sodium chloride 0.9 % bolus 500 mL, 500  mL, Intravenous, Q24H, Paliwal, Aditya, MD, 500 mL at 08/27/23 0452   sulfamethoxazole-trimethoprim (BACTRIM DS) 800-160 MG per tablet 1 tablet, 1 tablet, Oral, Daily, Judyann Munson, MD, 1 tablet at 08/26/23 1607  Labs and Diagnostic Imaging   CBC:  Recent Labs  Lab 08/25/23 1739 08/26/23 0933 08/27/23 0226  WBC 3.5* 2.6* 2.4*  NEUTROABS 2.7  --   --   HGB 13.5 11.6* 12.4*  HCT 39.4 33.3* 37.1*  MCV 86.0 85.2 87.1  PLT 166 123* 132*    Basic Metabolic Panel:  Lab Results  Component Value Date   NA 140 08/27/2023   K 4.0 08/27/2023   CO2 24 08/27/2023   GLUCOSE 98 08/27/2023   BUN 12 08/27/2023   CREATININE 1.01 08/27/2023   CALCIUM 8.9 08/27/2023   GFRNONAA >60 08/27/2023   Lipid Panel: No results found for: "LDLCALC" HgbA1c:  Lab Results  Component Value Date   HGBA1C 11.7 (H) 08/26/2023   Urine Drug Screen:     Component Value Date/Time   LABOPIA NONE DETECTED 08/25/2023 2100   COCAINSCRNUR NONE DETECTED 08/25/2023 2100   LABBENZ POSITIVE (A) 08/25/2023 2100   AMPHETMU POSITIVE (A) 08/25/2023 2100   THCU NONE DETECTED 08/25/2023 2100   LABBARB NONE DETECTED 08/25/2023 2100    Alcohol Level     Component Value Date/Time   ETH <10 08/25/2023 1739   INR No results found for: "INR" APTT No results found for: "APTT" AED levels: No results found for: "PHENYTOIN", "ZONISAMIDE", "LAMOTRIGINE", "LEVETIRACETA"  CT Head without contrast(Personally reviewed): No evidence of acute intracranial abnormality.   MRI Brain(Personally reviewed): PENDING  rEEG:  This study is within normal limits. No seizures or epileptiform discharges were seen throughout the recording   Assessment   Jeremiah Stevens is a 48 y.o. male who presents with seizures in the setting of severe hyperglycemia.  Patient seems to be improving since his admission neurological assessment, as his blood glucose has been more controlled. Remains off insulin gtt.  MRI and bedside LP have been  attempted. Unable to complete due to patient agitation. Plan is to attempt these with sedation medication today, with LP under fluoro.  He is more alert and interactive today, answering all questions with poor attention.   Will review results of LP and MRI for abnormalities. Likely diagnosis was seizure secondary to severe hyperglycemia. However, patient is now positive for cryptococcus. LP is needed to determine opening pressure, serial LPs may be indicated as well.   Recommendations   - MRI brain with and without  prefer done before LP to avoid meningeal enhancement on imaging - Continue Keppra 1000mg  BID for now - LP under flouro  Opening pressure needed - ID on board and started on induction treatment for Cryptococcus. - Continue glycemic and electrolyte management, as you are.  - Avoid deliriogenic medications as able - Avoid dehydration  We will continue to follow.  ______________________________________________________________________   Pt  seen by Neuro NP/APP and later by MD. Note/plan to be edited by MD as needed.    Lynnae January, DNP, AGACNP-BC [facility] Please use AMION for contact information & EPIC for messaging.  NEUROHOSPITALIST ADDENDUM Performed a face to face diagnostic evaluation.   I have reviewed the contents of history and physical exam as documented by PA/ARNP/Resident and agree with above documentation.  I have discussed and formulated the above plan as documented. Edits to the note have been made as needed.  Erick Blinks, MD [facility] [phone]   If 7pm to 7am, please call on call as listed on AMION.

## 2023-08-27 NOTE — Progress Notes (Addendum)
 [facility]    Date of Admission:  08/25/2023   Total days of antibiotics 2   ID: Jeremiah Stevens is a 48 y.o. adult with  48yo TF with advanced HIV disease/poorly controlled admitted for seizures in setting of hyperglycemia Principal Problem:   Hyperosmolar hyperglycemic state (HHS) (HCC) Active Problems:   Seizure (HCC)   Acute respiratory failure with hypoxia (HCC)   Acute encephalopathy   Hyperammonemia (HCC)   Pancytopenia (HCC)    Subjective: Crytpo serology showing 1:640 -- where liposomal ampho and flucytosine was started last night  Patient undergoing brain mri and LP - opening pressure at 12.6,   Csf cell count showing -- wbc 2, glu 55, and tp 71/ csf cr ag negative but serology elevated  Medications:   Chlorhexidine Gluconate Cloth  6 each Topical Daily   dextrose  10 mL Intravenous Q24H   dextrose  10 mL Intravenous Q24H   flucytosine  2,000 mg Oral Q6H   heparin  5,000 Units Subcutaneous Q8H   insulin aspart  2-6 Units Subcutaneous Q4H   insulin glargine-yfgn  16 Units Subcutaneous Daily   lactulose  10 g Oral Daily   mouth rinse  15 mL Mouth Rinse 4 times per day   sodium chloride  500 mL Intravenous Q24H   sodium chloride  500 mL Intravenous Q24H   sulfamethoxazole-trimethoprim  1 tablet Oral Daily    Objective: Vital signs in last 24 hours: Temp:  [96.1 F (35.6 C)-98.4 F (36.9 C)] 96.5 F (35.8 C) (02/14 0700) Pulse Rate:  [93-104] 101 (02/14 1300) Resp:  [13-26] 19 (02/14 1300) BP: (115-177)/(79-132) 152/119 (02/14 1300) SpO2:  [92 %-100 %] 95 % (02/14 1300) Weight:  [121.5 kg] 121.5 kg (02/14 0446) Physical Exam  Constitutional:  oriented to person, only. appears well-developed and well-nourished. No distress.  HENT: [place]/AT, PERRLA, no scleral icterus Mouth/Throat: Oropharynx is clear and moist. No oropharyngeal exudate.  Cardiovascular: Normal rate, regular rhythm and normal heart sounds. Exam reveals no  gallop and no friction rub.  No murmur heard.  Pulmonary/Chest: Effort normal and breath sounds normal. No respiratory distress.  has no wheezes.  Neck = supple, no nuchal rigidity Abdominal: Soft. Bowel sounds are normal.  exhibits no distension. There is no tenderness.  Lymphadenopathy: no cervical adenopathy. No axillary adenopathy Neurological: alert and oriented to person, place, and time.  Skin: Skin is warm and dry. No rash noted. No erythema.  Psychiatric: a normal mood and affect.  behavior is normal.    Lab Results Recent Labs    08/26/23 0933 08/26/23 1145 08/27/23 0226  WBC 2.6*  --  2.4*  HGB 11.6*  --  12.4*  HCT 33.3*  --  37.1*  NA 142 139 140  K 3.7 4.1 4.0  CL 110 109 104  CO2 25 23 24   BUN 14 15 12   CREATININE 0.99 1.04 1.01   Liver Panel Recent Labs    08/25/23 1739  PROT 9.1*  ALBUMIN 3.1*  AST 31  ALT 26  ALKPHOS 91  BILITOT 0.6   Sedimentation Rate No results for input(s): "ESRSEDRATE" in the last 72 hours. C-Reactive Protein No results for input(s): "CRP" in the last 72 hours.  Microbiology: pending Studies/Results: EEG adult Result Date: 08/26/2023 Jeremiah Quest, MD     08/26/2023 10:22 AM Patient Name: Jeremiah Stevens MRN: [id number] Epilepsy Attending: Charlsie Stevens Referring Physician/Provider: Rejeana Brock, MD Date: 08/26/2023 Duration: 08/26/2023 Patient history:  48 y.o. male who presents with seizures in the setting of severe hyperglycemia. Level of alertness: Awake, asleep AEDs during EEG study: LEV Technical aspects: This EEG study was done with scalp electrodes positioned according to the 10-20 International system of electrode placement. Electrical activity was reviewed with band pass filter of 1-70Hz , sensitivity of 7 uV/mm, display speed of 47mm/sec with a 60Hz  notched filter applied as appropriate. EEG data were recorded continuously and digitally stored.  Video monitoring was available and reviewed as appropriate.  Description: The posterior dominant rhythm consists of 8 Hz activity of moderate voltage (25-35 uV) seen predominantly in posterior head regions, symmetric and reactive to eye opening and eye closing. Sleep was characterized by vertex waves, sleep spindles (12 to 14 Hz), maximal frontocentral region. Hyperventilation and photic stimulation were not performed. IMPRESSION: This study is within normal limits. No seizures or epileptiform discharges were seen throughout the recording. A normal interictal EEG does not exclude the diagnosis of epilepsy.  Jeremiah Stevens   CT Head Wo Contrast Result Date: 08/25/2023 CLINICAL DATA:  ams seizure EXAM: CT HEAD WITHOUT CONTRAST TECHNIQUE: Contiguous axial images were obtained from the base of the skull through the vertex without intravenous contrast. RADIATION DOSE REDUCTION: This exam was performed according to the departmental dose-optimization program which includes automated exposure control, adjustment of the mA and/or kV according to patient size and/or use of iterative reconstruction technique. COMPARISON:  None Available. FINDINGS: Brain: No evidence of acute infarction, hemorrhage, hydrocephalus, extra-axial collection or mass lesion/mass effect. Vascular: No hyperdense vessel. Skull: No acute fracture. Sinuses/Orbits: Mostly clear sinuses.  No acute orbital findings. Other: No mastoid effusions. IMPRESSION: No evidence of acute intracranial abnormality. Electronically Signed   By: Feliberto Harts M.D.   On: 08/25/2023 20:41   DG Chest Portable 1 View Result Date: 08/25/2023 CLINICAL DATA:  Hypoxia EXAM: PORTABLE CHEST 1 VIEW COMPARISON:  None Available. FINDINGS: The right inferior costophrenic angle is clipped off the edge of the film. Underinflation. Enlarged cardiopericardial silhouette. Mild perihilar opacities could relate to level of inflation and technique. Bronchovascular crowding. No separate consolidation, pneumothorax or effusion. IMPRESSION:  Underinflation with enlarged heart and presumed bronchovascular crowding. Follow up study with improved inflation may be of some benefit however if there is further concern to better evaluate lung fields. The right inferior costophrenic angle is clipped off the edge of the film. Electronically Signed   By: Karen Kays M.D.   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 Recommend to get central line to help with iv access to administer abtx and iv fluid to minimize nephrotoxicity. Awating meningitis panel to see is crypto detected by PCR. Csf to culture. In early meningitis, can see serology cr ag + and CSF cr ag falsely negative.  Continue on L-ampho at current doses, and flucytosine. Follow electrolytes for repletion   Hiv disease= will hold on bitkarvy until 2 wk of iv L-ampho and flucytosine in the system or can consider sooner/later depending on patient course  Oi proph = will continue with bactrim ds daily plus weekly azithro 1200mg  po.  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 Will see on Monday.  [facility] Pager: [phone]  08/27/2023, 1:37 PM

## 2023-08-27 NOTE — Progress Notes (Signed)
 ID PROGRESS NOTE   Reviewed results from brain MRI, LP opening pressure, CSFresults, PCR meningitis panel.  - it does not appear to be consistent with cryptococcal meningitis ( PCR is so sensitive) - this could be due to cryptococcemia, possibly early disease to explain serum cr ag + 1:640  and not invasive to cryptococcal meningitis - we will get fungal blood cx despite 1 dose of ampho - will stop flucytosine but continue L-ampho - will follow fungal blood cx to see if it becomes positive  Aubri Gathright B. Drue Second MD MPH [facility] [phone]

## 2023-08-27 NOTE — Progress Notes (Signed)
 Peripherally Inserted Central Catheter Placement  The IV Nurse has discussed with the patient and/or persons authorized to consent for the patient, the purpose of this procedure and the potential benefits and risks involved with this procedure.  The benefits include less needle sticks, lab draws from the catheter, and the patient may be discharged home with the catheter. Risks include, but not limited to, infection, bleeding, blood clot (thrombus formation), and puncture of an artery; nerve damage and irregular heartbeat and possibility to perform a PICC exchange if needed/ordered by physician.  Alternatives to this procedure were also discussed.  Bard Power PICC patient education guide, fact sheet on infection prevention and patient information card has been provided to patient /or left at bedside.    PICC Placement Documentation  PICC Double Lumen 08/27/23 Right Basilic 47 cm 1 cm (Active)  Indication for Insertion or Continuance of Line Prolonged intravenous therapies 08/27/23 1633  Exposed Catheter (cm) 1 cm 08/27/23 1633  Site Assessment Clean, Dry, Intact 08/27/23 1633  Lumen #1 Status Flushed;Saline locked;Blood return noted 08/27/23 1633  Lumen #2 Status Flushed;Saline locked;Blood return noted 08/27/23 1633  Dressing Type Transparent;Securing device 08/27/23 1633  Dressing Status Antimicrobial disc/dressing in place;Clean, Dry, Intact 08/27/23 1633  Line Care Connections checked and tightened 08/27/23 1633  Line Adjustment (NICU/IV Team Only) No 08/27/23 1633  Dressing Intervention New dressing;Adhesive placed at insertion site (IV team only);Other (Comment) 08/27/23 1633  Dressing Change Due 09/03/23 08/27/23 1633       Annett Fabian 08/27/2023, 4:34 PM

## 2023-08-27 NOTE — Progress Notes (Signed)
 Pharmacy Antibiotic Note  Jeremiah Stevens is a 48 y.o. adult admitted on 08/25/2023 now with concern for cryptococcal meningitis.   Pharmacy assisted with amphotericin and flucytosine dosing.  Crypto Ag positive with titer 640. Planning to attempt LP today for further evaluation and confirmation but clinical picture of known HIV with noncompliance, CD4<50, and admission with seizures seems to suggest cryptococcal meningitis.   Today's labs reflect K 4 and Mg 1.9 - Mg 4g IV x 1 ordered by CCM. No other electrolyte replacement needed at this time.   Plan: - Adjust amphotericin B (Ambisome) 350 mg (for ~3-3.5 mg/kg dosing, capping dosing weight at 100 kg given obesity with BMI>42) every 24 hours - Continue pre/post fluid boluses with NS and D5W flushes to reduce nephrotoxicity - Continue flucytosine 2000 mg (~25 mg/kg using IBW) every 6 hours -F/u cx results, clinical pic, and ID recs - monitor daily BMET for K and Mag levels and replace as needed  Height: 5\' 7"  (170.2 cm) Weight: 121.5 kg (267 lb 13.7 oz) IBW/kg (Calculated) : 66.1  Temp (24hrs), Avg:97.3 F (36.3 C), Min:96.1 F (35.6 C), Max:98.4 F (36.9 C)  Recent Labs  Lab 08/25/23 1739 08/25/23 1920 08/26/23 0039 08/26/23 0328 08/26/23 0933 08/26/23 1145 08/27/23 0226  WBC 3.5*  --   --   --  2.6*  --  2.4*  CREATININE 1.79* 1.79*  --  1.01 0.99 1.04 1.01  LATICACIDVEN  --   --  1.7  --   --   --   --     Estimated Creatinine Clearance (by C-G formula based on SCr of 1.01 mg/dL) Male: 16.1 mL/min Male: 112.9 mL/min    No Known Allergies  Thank you for allowing pharmacy to be a part of this patient's care.  Georgina Pillion, PharmD, BCPS, BCIDP Infectious Diseases Clinical Pharmacist 08/27/2023 1:03 PM   **Pharmacist phone directory can now be found on [email or web address] ([email or web address]).  Listed under [facility].

## 2023-08-28 ENCOUNTER — Other Ambulatory Visit: Payer: Self-pay

## 2023-08-28 ENCOUNTER — Inpatient Hospital Stay (HOSPITAL_COMMUNITY): Payer: Medicaid Other

## 2023-08-28 DIAGNOSIS — R569 Unspecified convulsions: Secondary | ICD-10-CM | POA: Diagnosis not present

## 2023-08-28 DIAGNOSIS — G9341 Metabolic encephalopathy: Secondary | ICD-10-CM | POA: Diagnosis not present

## 2023-08-28 DIAGNOSIS — B459 Cryptococcosis, unspecified: Secondary | ICD-10-CM | POA: Diagnosis not present

## 2023-08-28 DIAGNOSIS — B20 Human immunodeficiency virus [HIV] disease: Secondary | ICD-10-CM | POA: Diagnosis not present

## 2023-08-28 DIAGNOSIS — E1100 Type 2 diabetes mellitus with hyperosmolarity without nonketotic hyperglycemic-hyperosmolar coma (NKHHC): Secondary | ICD-10-CM | POA: Diagnosis not present

## 2023-08-28 DIAGNOSIS — E119 Type 2 diabetes mellitus without complications: Secondary | ICD-10-CM | POA: Diagnosis not present

## 2023-08-28 LAB — BASIC METABOLIC PANEL
Anion gap: 8 (ref 5–15)
BUN: 12 mg/dL (ref 6–20)
CO2: 23 mmol/L (ref 22–32)
Calcium: 8.2 mg/dL — ABNORMAL LOW (ref 8.9–10.3)
Chloride: 102 mmol/L (ref 98–111)
Creatinine, Ser: 1.35 mg/dL — ABNORMAL HIGH (ref 0.61–1.24)
GFR, Estimated: >60 mL/min (ref >60)
Glucose, Bld: 263 mg/dL — ABNORMAL HIGH (ref 70–99)
Potassium: 3.6 mmol/L (ref 3.5–5.1)
Sodium: 133 mmol/L — ABNORMAL LOW (ref 135–145)

## 2023-08-28 LAB — GLUCOSE, CAPILLARY
Glucose-Capillary: 238 mg/dL — ABNORMAL HIGH (ref 70–99)
Glucose-Capillary: 248 mg/dL — ABNORMAL HIGH (ref 70–99)
Glucose-Capillary: 233 mg/dL — ABNORMAL HIGH (ref 70–99)
Glucose-Capillary: 205 mg/dL — ABNORMAL HIGH (ref 70–99)
Glucose-Capillary: 193 mg/dL — ABNORMAL HIGH (ref 70–99)
Glucose-Capillary: 154 mg/dL — ABNORMAL HIGH (ref 70–99)
Glucose-Capillary: 219 mg/dL — ABNORMAL HIGH (ref 70–99)

## 2023-08-28 LAB — PHOSPHORUS: Phosphorus: 4.3 mg/dL (ref 2.5–4.6)

## 2023-08-28 LAB — MAGNESIUM: Magnesium: 2.2 mg/dL (ref 1.7–2.4)

## 2023-08-28 MED ORDER — LABETALOL HCL 5 MG/ML IV SOLN
10.0000 mg | Freq: Four times a day (QID) | INTRAVENOUS | Status: DC | PRN
  Administered 2023-08-29: 10 mg via INTRAVENOUS
  Filled 2023-08-28: qty 4

## 2023-08-28 MED ORDER — POTASSIUM CHLORIDE CRYS ER 20 MEQ PO TBCR
40.0000 meq | EXTENDED_RELEASE_TABLET | Freq: Once | ORAL | Status: AC
  Administered 2023-08-28: 40 meq via ORAL
  Filled 2023-08-28: qty 2

## 2023-08-28 MED ORDER — INSULIN GLARGINE-YFGN 100 UNIT/ML ~~LOC~~ SOLN
10.0000 [IU] | Freq: Two times a day (BID) | SUBCUTANEOUS | Status: DC
  Administered 2023-08-28 – 2023-08-29 (×4): 10 [IU] via SUBCUTANEOUS
  Filled 2023-08-28 (×7): qty 0.1

## 2023-08-28 MED ORDER — INSULIN ASPART 100 UNIT/ML IJ SOLN
0.0000 [IU] | INTRAMUSCULAR | Status: DC
  Administered 2023-08-28: 3 [IU] via SUBCUTANEOUS
  Administered 2023-08-28 (×2): 5 [IU] via SUBCUTANEOUS
  Administered 2023-08-28: 4 [IU] via SUBCUTANEOUS
  Administered 2023-08-28 (×2): 5 [IU] via SUBCUTANEOUS
  Administered 2023-08-29: 3 [IU] via SUBCUTANEOUS
  Administered 2023-08-29: 8 [IU] via SUBCUTANEOUS

## 2023-08-28 NOTE — Plan of Care (Signed)

## 2023-08-28 NOTE — Progress Notes (Signed)
 NEUROLOGY CONSULT FOLLOW UP NOTE   Date of service: August 28, 2023 Patient Name: Jeremiah Stevens MRN:  [id number] DOB:  11/01/1975  Interval Hx/subjective   Patient's mental status has significantly improved. LP opening pressure was 12.  Vitals   Vitals:   08/28/23 1315 08/28/23 1330 08/28/23 1345 08/28/23 1400  BP:   (!) 147/105 (!) 156/101  Pulse: (!) 104 (!) 104 100 99  Resp: 19 (!) 23 (!) 8 (!) 21  Temp:      TempSrc:      SpO2: 98% 97% 97% 98%  Weight:      Height:         Body mass index is 41.95 kg/m.  Physical Exam   Constitutional: Appears well-developed, but disheveled.  Psych: Cooperative, but restless quickly Eyes: No scleral injection.  HENT: No OP obstrucion.  Head: Normocephalic.  Cardiovascular: Normal rate and regular rhythm.  Respiratory: Effort normal, non-labored breathing.  GI: Soft.  No distension. There is no tenderness.  Skin: WDI.   Neurologic Examination   Drowsy, but does awaken to voice easily and is cooperative. Orientation and attention is improved today, but still poor attention and concentration.  VFF, tracks examiner thoroughly.  Follows commands and answers questions without any delay or confusion seen.  Spontaneously moves all extremities.  No overt ataxia noted. Neck ROM does not seem limited.   Medications  Current Facility-Administered Medications:    0.9 %  sodium chloride infusion, , Intravenous, PRN, Cheri Fowler, MD, Stopped at 08/27/23 2212   acetaminophen (TYLENOL) tablet 650 mg, 650 mg, Oral, Daily PRN, Paliwal, Aditya, MD, 650 mg at 08/28/23 1108   amphotericin B liposome (AMBISOME) 350 mg in dextrose 5 % 500 mL IVPB, 350 mg, Intravenous, Q24H, Ann Held, [facility], Last Rate: 294 mL/hr at 08/28/23 0400, Infusion Verify at 08/28/23 0400   azithromycin (ZITHROMAX) tablet 1,200 mg, 1,200 mg, Oral, Weekly, Judyann Munson, MD, 1,200 mg at 08/28/23 1109   Chlorhexidine Gluconate Cloth 2 % PADS 6 each, 6 each,  Topical, Daily, Steffanie Dunn, DO, 6 each at 08/28/23 1039   dextrose 5 % 10 mL, 10 mL, Intravenous, Q24H, Paliwal, Aditya, MD, 10 mL at 08/28/23 0231   dextrose 5 % 10 mL, 10 mL, Intravenous, Q24H, Paliwal, Aditya, MD, 10 mL at 08/28/23 0452   dextrose 50 % solution 0-50 mL, 0-50 mL, Intravenous, PRN, Rondel Baton, MD   diphenhydrAMINE (BENADRYL) injection 25 mg, 25 mg, Intravenous, Daily PRN **OR** diphenhydrAMINE (BENADRYL) capsule 25 mg, 25 mg, Oral, Daily PRN, Paliwal, Aditya, MD   docusate sodium (COLACE) capsule 100 mg, 100 mg, Oral, BID PRN, Desai, Rahul P, PA-C   feeding supplement (OSMOLITE 1.5 CAL) liquid 1,000 mL, 1,000 mL, Per Tube, Continuous, Chand, Sudham, MD, Last Rate: 30 mL/hr at 08/28/23 0400, Infusion Verify at 08/28/23 0400   feeding supplement (PROSource TF20) liquid 60 mL, 60 mL, Per Tube, Daily, Chand, Sudham, MD, 60 mL at 08/27/23 1932   heparin injection 5,000 Units, 5,000 Units, Subcutaneous, Q8H, Desai, Rahul P, PA-C, 5,000 Units at 08/28/23 1355   hydrALAZINE (APRESOLINE) injection 10 mg, 10 mg, Intravenous, Q4H PRN, Paliwal, Aditya, MD, 10 mg at 08/28/23 1108   insulin aspart (novoLOG) injection 0-15 Units, 0-15 Units, Subcutaneous, Q4H, Paliwal, Aditya, MD, 5 Units at 08/28/23 1354   insulin glargine-yfgn (SEMGLEE) injection 10 Units, 10 Units, Subcutaneous, BID, Chand, Sudham, MD, 10 Units at 08/28/23 1354   lactulose (CHRONULAC) 10 GM/15ML solution 10 g, 10 g, Oral, Daily,  Cristopher Peru, PA-C, 10 g at 08/28/23 1108   levETIRAcetam (KEPPRA) IVPB 1000 mg/100 mL premix, 1,000 mg, Intravenous, Q12H, Rejeana Brock, MD, Last Rate: 400 mL/hr at 08/28/23 0519, 1,000 mg at 08/28/23 0519   LORazepam (ATIVAN) tablet 1 mg, 1 mg, Oral, Once PRN, Paliwal, Aditya, MD   meperidine (DEMEROL) injection 25 mg, 25 mg, Intravenous, Q15 min PRN, Paliwal, Aditya, MD   Oral care mouth rinse, 15 mL, Mouth Rinse, 4 times per day, Karie Fetch P, DO, 15 mL at 08/28/23 1110    Oral care mouth rinse, 15 mL, Mouth Rinse, PRN, Karie Fetch P, DO   polyethylene glycol (MIRALAX / GLYCOLAX) packet 17 g, 17 g, Oral, Daily PRN, Celine Mans, Rahul P, PA-C   sulfamethoxazole-trimethoprim (BACTRIM DS) 800-160 MG per tablet 1 tablet, 1 tablet, Oral, Daily, Judyann Munson, MD, 1 tablet at 08/28/23 1109  Labs and Diagnostic Imaging   CBC:  Recent Labs  Lab 08/25/23 1739 08/26/23 0933 08/27/23 0226  WBC 3.5* 2.6* 2.4*  NEUTROABS 2.7  --   --   HGB 13.5 11.6* 12.4*  HCT 39.4 33.3* 37.1*  MCV 86.0 85.2 87.1  PLT 166 123* 132*    Basic Metabolic Panel:  Lab Results  Component Value Date   NA 133 (L) 08/28/2023   K 3.6 08/28/2023   CO2 23 08/28/2023   GLUCOSE 263 (H) 08/28/2023   BUN 12 08/28/2023   CREATININE 1.35 (H) 08/28/2023   CALCIUM 8.2 (L) 08/28/2023   GFRNONAA >60 08/28/2023   Lipid Panel: No results found for: "LDLCALC" HgbA1c:  Lab Results  Component Value Date   HGBA1C 11.7 (H) 08/26/2023   Urine Drug Screen:     Component Value Date/Time   LABOPIA NONE DETECTED 08/25/2023 2100   COCAINSCRNUR NONE DETECTED 08/25/2023 2100   LABBENZ POSITIVE (A) 08/25/2023 2100   AMPHETMU POSITIVE (A) 08/25/2023 2100   THCU NONE DETECTED 08/25/2023 2100   LABBARB NONE DETECTED 08/25/2023 2100    Alcohol Level     Component Value Date/Time   ETH <10 08/25/2023 1739   INR No results found for: "INR" APTT No results found for: "APTT" AED levels: No results found for: "PHENYTOIN", "ZONISAMIDE", "LAMOTRIGINE", "LEVETIRACETA"  CT Head without contrast(Personally reviewed): No evidence of acute intracranial abnormality.   MRI Brain(Personally reviewed): No acute or reversible finding. 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 Mild mucosal inflammatory changes of the paranasal sinuses.  rEEG:  This study is within normal limits. No seizures or  epileptiform discharges were seen throughout the recording   Assessment   Jeremiah Stevens is a 48 y.o. male who presents with seizures in the setting of severe hyperglycemia.  Patient seems to be improving since his admission neurological assessment, as his blood glucose has been more controlled. Remains off insulin gtt.  MRI and bedside LP have been attempted. Unable to complete due to patient agitation. Plan is to attempt these with sedation medication today, with LP under fluoro.  He is more alert and interactive today, answering all questions with poor attention.   Likely diagnosis was seizure secondary to severe hyperglycemia. Sugars have been well-controlled, no further seizure activity. Ok to discontinue Keppra.  However, patient is now positive for cryptococcus. LP showed normal opening pressure of 12.5, not concerning for cryptococcal meningitis.   Recommendations   - Discontinue Keppra - ID on board, continued induction treatment for Cryptococcus. -  Continue glycemic and electrolyte management, as you are.  - Avoid sedation and deliriogenic medications as able - Avoid dehydration  We will continue to follow.  ______________________________________________________________________    Pt seen by Neuro NP/APP and later by MD. Note/plan to be edited by MD as needed.    Lynnae January, DNP, AGACNP-BC [facility] Please use AMION for contact information & EPIC for messaging.  NEUROHOSPITALIST ADDENDUM Performed a face to face diagnostic evaluation.   I have reviewed the contents of history and physical exam as documented by PA/ARNP/Resident and agree with above documentation.  I have discussed and formulated the above plan as documented. Edits to the note have been made as needed.  Impression/Key exam findings/Plan: MRI brain and LP not concerning for meningitis. Specifically, CSF cryptococcus Ag is negative, opening pressure is not elevated with no CSF  pleocytosis. Suspect Seizure was likely triggered by hyperglycemia.  We can stop Keppra and see how patient does.  Erick Blinks, MD [facility] [phone]   If 7pm to 7am, please call on call as listed on AMION.

## 2023-08-28 NOTE — Progress Notes (Addendum)
 Subjective:  Says that the court rectal tube in her nose is causing her discomfort and the only headache that she has is from this   Antibiotics:  Anti-infectives (From admission, onward)    Start     Dose/Rate Route Frequency Ordered Stop   08/28/23 1000  azithromycin (ZITHROMAX) tablet 1,200 mg        1,200 mg Oral Weekly 08/27/23 1513     08/28/23 0200  amphotericin B liposome (AMBISOME) 350 mg in dextrose 5 % 500 mL IVPB        350 mg 293.8 mL/hr over 120 Minutes Intravenous Every 24 hours 08/27/23 1259     08/27/23 0000  flucytosine (ANCOBON) capsule 2,250 mg  Status:  Discontinued        25 mg/kg  87.5 kg (Adjusted) Oral Every 6 hours 08/26/23 2102 08/26/23 2143   08/27/23 0000  flucytosine (ANCOBON) capsule 1,750 mg  Status:  Discontinued        25 mg/kg  66.1 kg (Ideal) Oral Every 6 hours 08/26/23 2143 08/26/23 2246   08/27/23 0000  flucytosine (ANCOBON) capsule 2,000 mg  Status:  Discontinued        2,000 mg Oral Every 6 hours 08/26/23 2246 08/27/23 1837   08/26/23 2300  amphotericin B liposome (AMBISOME) 260 mg in dextrose 5 % 500 mL IVPB  Status:  Discontinued        3 mg/kg  87.5 kg (Adjusted) 282.5 mL/hr over 120 Minutes Intravenous Every 24 hours 08/26/23 2102 08/26/23 2146   08/26/23 2300  amphotericin B liposome (AMBISOME) 300 mg in dextrose 5 % 500 mL IVPB  Status:  Discontinued        300 mg 287.5 mL/hr over 120 Minutes Intravenous Every 24 hours 08/26/23 2146 08/27/23 1259   08/26/23 1445  bictegravir-emtricitabine-tenofovir AF (BIKTARVY) 50-200-25 MG per tablet 1 tablet  Status:  Discontinued        1 tablet Oral Daily 08/26/23 1349 08/27/23 0823   08/26/23 1445  sulfamethoxazole-trimethoprim (BACTRIM DS) 800-160 MG per tablet 1 tablet        1 tablet Oral Daily 08/26/23 1349         Medications: Scheduled Meds:  azithromycin  1,200 mg Oral Weekly   Chlorhexidine Gluconate Cloth  6 each Topical Daily   dextrose  10 mL Intravenous Q24H    dextrose  10 mL Intravenous Q24H   feeding supplement (PROSource TF20)  60 mL Per Tube Daily   heparin  5,000 Units Subcutaneous Q8H   insulin aspart  0-15 Units Subcutaneous Q4H   insulin glargine-yfgn  10 Units Subcutaneous BID   lactulose  10 g Oral Daily   mouth rinse  15 mL Mouth Rinse 4 times per day   sulfamethoxazole-trimethoprim  1 tablet Oral Daily   Continuous Infusions:  sodium chloride Stopped (08/27/23 2212)   amphotericin B liposome (AMBISOME) 350 mg in dextrose 5 % 500 mL IVPB 294 mL/hr at 08/28/23 0400   feeding supplement (OSMOLITE 1.5 CAL) 30 mL/hr at 08/28/23 0400   PRN Meds:.sodium chloride, acetaminophen, dextrose, diphenhydrAMINE **OR** diphenhydrAMINE, docusate sodium, hydrALAZINE, labetalol, LORazepam, meperidine (DEMEROL) injection, mouth rinse, polyethylene glycol    Objective: Weight change:   Intake/Output Summary (Last 24 hours) at 08/28/2023 1429 Last data filed at 08/28/2023 1000 Gross per 24 hour  Intake 5569.21 ml  Output 2300 ml  Net 3269.21 ml   Blood pressure (!) 156/101, pulse 99, temperature 98.1 F (36.7 C), temperature source Oral,  resp. rate (!) 21, height 5\' 7"  (1.702 m), weight 121.5 kg, SpO2 98%. Temp:  [98.1 F (36.7 C)] 98.1 F (36.7 C) (02/14 2000) Pulse Rate:  [96-117] 99 (02/15 1400) Resp:  [8-30] 21 (02/15 1400) BP: (136-177)/(87-126) 156/101 (02/15 1400) SpO2:  [92 %-100 %] 98 % (02/15 1400)  Physical Exam: Physical Exam Constitutional:      General: She is not in acute distress.    Appearance: She is well-developed. She is not diaphoretic.  HENT:     Head: Normocephalic and atraumatic.     Right Ear: External ear normal.     Left Ear: External ear normal.     Mouth/Throat:     Pharynx: No oropharyngeal exudate.  Eyes:     General: No scleral icterus.    Conjunctiva/sclera: Conjunctivae normal.     Pupils: Pupils are equal, round, and reactive to light.  Cardiovascular:     Rate and Rhythm: Normal rate and regular  rhythm.     Heart sounds: Normal heart sounds. No murmur heard.    No friction rub. No gallop.  Pulmonary:     Effort: Pulmonary effort is normal. No respiratory distress.     Breath sounds: Normal breath sounds. No wheezing or rales.  Abdominal:     Palpations: Abdomen is soft.  Musculoskeletal:        General: No tenderness. Normal range of motion.  Lymphadenopathy:     Cervical: No cervical adenopathy.  Skin:    General: Skin is warm and dry.     Coloration: Skin is not pale.     Findings: No erythema or rash.  Neurological:     General: No focal deficit present.     Mental Status: She is alert and oriented to person, place, and time.     Motor: No abnormal muscle tone.  Psychiatric:        Mood and Affect: Mood normal.        Behavior: Behavior normal.        Thought Content: Thought content normal.        Judgment: Judgment normal.      CBC:    BMET Recent Labs    08/27/23 0226 08/28/23 0804  NA 140 133*  K 4.0 3.6  CL 104 102  CO2 24 23  GLUCOSE 98 263*  BUN 12 12  CREATININE 1.01 1.35*  CALCIUM 8.9 8.2*     Liver Panel  Recent Labs    08/25/23 1739  PROT 9.1*  ALBUMIN 3.1*  AST 31  ALT 26  ALKPHOS 91  BILITOT 0.6       Sedimentation Rate No results for input(s): "ESRSEDRATE" in the last 72 hours. C-Reactive Protein No results for input(s): "CRP" in the last 72 hours.  Micro Results: Recent Results (from the past 720 hours)  Resp panel by RT-PCR (RSV, Flu A&B, Covid) Anterior Nasal Swab     Status: None   Collection Time: 08/25/23  7:20 PM   Specimen: Anterior Nasal Swab  Result Value Ref Range Status   SARS Coronavirus 2 by RT PCR NEGATIVE NEGATIVE Final   Influenza A by PCR NEGATIVE NEGATIVE Final   Influenza B by PCR NEGATIVE NEGATIVE Final    Comment: (NOTE) The Xpert Xpress SARS-CoV-2/FLU/RSV plus assay is intended as an aid in the diagnosis of influenza from Nasopharyngeal swab specimens and should not be used as a sole  basis for treatment. Nasal washings and aspirates are unacceptable for Xpert Xpress SARS-CoV-2/FLU/RSV testing.  Fact Sheet for Patients: BloggerCourse.com  Fact Sheet for Healthcare Providers: SeriousBroker.it  This test is not yet approved or cleared by the [place] FDA and has been authorized for detection and/or diagnosis of SARS-CoV-2 by FDA under an Emergency Use Authorization (EUA). This EUA will remain in effect (meaning this test can be used) for the duration of the COVID-19 declaration under Section 564(b)(1) of the Act, 21 U.S.C. section 360bbb-3(b)(1), unless the authorization is terminated or revoked.     Resp Syncytial Virus by PCR NEGATIVE NEGATIVE Final    Comment: (NOTE) Fact Sheet for Patients: BloggerCourse.com  Fact Sheet for Healthcare Providers: SeriousBroker.it  This test is not yet approved or cleared by the [place] FDA and has been authorized for detection and/or diagnosis of SARS-CoV-2 by FDA under an Emergency Use Authorization (EUA). This EUA will remain in effect (meaning this test can be used) for the duration of the COVID-19 declaration under Section 564(b)(1) of the Act, 21 U.S.C. section 360bbb-3(b)(1), unless the authorization is terminated or revoked.  Performed at [facility], [address] [place], [place] [address]   MRSA Next Gen by PCR, Nasal     Status: None   Collection Time: 08/25/23 10:49 PM   Specimen: Nasal Mucosa; Nasal Swab  Result Value Ref Range Status   MRSA by PCR Next Gen NOT DETECTED NOT DETECTED Final    Comment: (NOTE) The GeneXpert MRSA Assay (FDA approved for NASAL specimens only), is one component of a comprehensive MRSA colonization surveillance program. It is not intended to diagnose MRSA infection nor to guide or monitor treatment for MRSA infections. Test performance is not FDA approved in  patients less than 57 years old. Performed at [facility], [address], [place], [place] [address]   CSF culture w Gram Stain     Status: None (Preliminary result)   Collection Time: 08/27/23 12:12 PM   Specimen: PATH Cytology CSF; Cerebrospinal Fluid  Result Value Ref Range Status   Specimen Description CSF  Final   Special Requests NONE  Final   Gram Stain   Final    WBC PRESENT, PREDOMINANTLY MONONUCLEAR NO ORGANISMS SEEN CYTOSPIN SMEAR    Culture   Final    NO GROWTH < 24 HOURS Performed at [facility], [address], [place], [place] [address]    Report Status PENDING  Incomplete  Culture, Fungus without Smear     Status: None (Preliminary result)   Collection Time: 08/27/23 12:12 PM   Specimen: PATH Cytology CSF; Cerebrospinal Fluid  Result Value Ref Range Status   Specimen Description CSF  Final   Special Requests NONE  Final   Culture   Final    NO GROWTH < 24 HOURS Performed at [facility], [address] [place], [place] [address]    Report Status PENDING  Incomplete  Anaerobic culture w Gram Stain     Status: None (Preliminary result)   Collection Time: 08/27/23 12:12 PM   Specimen: PATH Cytology CSF; Cerebrospinal Fluid  Result Value Ref Range Status   Specimen Description CSF  Final   Special Requests NONE  Final   Gram Stain   Final    WBC PRESENT, PREDOMINANTLY MONONUCLEAR NO ORGANISMS SEEN CYTOSPIN SMEAR Performed at [facility], [address] [place], [place] [address]    Culture PENDING  Incomplete   Report Status PENDING  Incomplete  Fungus culture, blood     Status: None (Preliminary  result)   Collection Time: 08/27/23  8:15 PM   Specimen: BLOOD LEFT HAND  Result Value Ref Range Status   Specimen Description BLOOD LEFT HAND  Final   Special Requests   Final    BOTTLES DRAWN AEROBIC AND ANAEROBIC Blood Culture adequate volume   Culture   Final    NO GROWTH < 12 HOURS Performed at [facility], [address] [place], [place] [address]    Report Status PENDING  Incomplete    Studies/Results: MR BRAIN W WO CONTRAST Result Date: 08/27/2023 CLINICAL DATA:  Seizure, new onset, no history of trauma. Mental status change. EXAM: MRI HEAD WITHOUT AND WITH CONTRAST TECHNIQUE: Multiplanar, multiecho pulse sequences of the brain and surrounding structures were obtained without and with intravenous contrast. CONTRAST:  10mL GADAVIST GADOBUTROL 1 MMOL/ML IV SOLN COMPARISON:  Head CT 08/25/2023 FINDINGS: Brain: Diffusion imaging does not show any acute or subacute infarction or other cause of restricted diffusion. No focal abnormality affects the brainstem or cerebellum. 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 No large vessel territory stroke. Mesial temporal lobes are within normal limits. No sign of mass, hemorrhage, hydrocephalus or extra-axial collection. After contrast administration, there is no abnormal brain or leptomeningeal contrast enhancement. Vascular: Major vessels at the base of the brain show flow. Skull and upper cervical spine: Negative Sinuses/Orbits: Mild mucosal inflammatory changes of the paranasal sinuses. Orbits negative except for apparent divergent gaze. Other: None IMPRESSION: 1. No acute or reversible finding. 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 2. Mild mucosal inflammatory changes of the paranasal sinuses. Electronically Signed   By: Paulina Fusi M.D.   On: 08/27/2023 17:23   [place] EKG SITE RITE Result Date: 08/27/2023 If Site Rite image not attached, placement could not be confirmed due to current cardiac rhythm.  DG FL GUIDED LUMBAR PUNCTURE Result Date: 08/27/2023 CLINICAL DATA:  48 year old transgender male with recent seizure. Lumbar  puncture requested for cryptococcal meningitis evaluation. EXAM: DIAGNOSTIC LUMBAR PUNCTURE UNDER FLUOROSCOPIC GUIDANCE COMPARISON:  None Available. FLUOROSCOPY: Radiation Exposure Index (as provided by the fluoroscopic device): 17.30 mGy Kerma PROCEDURE: Informed consent was obtained from the patient prior to the procedure, including potential complications of headache, allergy, and pain. With the patient prone, the lower back was prepped with Betadine. 1% Lidocaine was used for local anesthesia. Lumbar puncture was performed at the L4-L5 level using a 20 gauge needle with return of clear CSF with an opening pressure of 12.5 cm water. Ten ml of CSF were obtained for laboratory studies. The patient tolerated the procedure well and there were no apparent complications. IMPRESSION: Technically successful lumbar puncture. Procedure performed by Buzzy Han, PA-C. Supervised by Jeronimo Greaves, M.D. Electronically Signed   By: Jeronimo Greaves M.D.   On: 08/27/2023 14:00      Assessment/Plan:  INTERVAL HISTORY:  patient remains on amphotericin   Principal Problem:   Hyperosmolar hyperglycemic state (HHS) (HCC) Active Problems:   Seizure (HCC)   Acute respiratory failure with hypoxia (HCC)   Acute encephalopathy   Hyperammonemia (HCC)   Pancytopenia (HCC)    Jeremiah Stevens is a 48 y.o. adult transgender male with advanced HIV, AIDS who was admitted with seizures in the setting of hyperglycemia and found to have a positive serum cryptococcal antigen.  Lumbar puncture though did not show cryptococcus antigen and goddess encephalitis panel was also negative.  She is also without headaches.  Fungal blood cultures were ordered last night by Dr. Drue Second  CSF cultures are no growth so far  #1 Cryptococcal infection: I suspect that patient has disease but NOT CNS disease. If she were fungemic that would also require amphotericin  -- Amphotericin for now -- Follow-up blood cultures CSF  cultures --check CT chest to look for cryptococcal disease there  #2 HIV/AIDS: ARV has been held but I think should be able to start again given CSF profile, MRI findings  She will need PCP prophylaxis as well  I have personally spent involved in face-to-face and non-face-to-face activities for this patient on the day of the visit. Professional time spent includes the following activities: Preparing to see the patient (review of tests), Obtaining and/or reviewing separately obtained history (admission/discharge record), Performing a medically appropriate examination and/or evaluation , Ordering medications/tests/procedures, referring and communicating with other health care professionals, Documenting clinical information in the EMR, Independently interpreting results (not separately reported), Communicating results to the patient/family/caregiver, Counseling and educating the patient/family/caregiver and Care coordination (not separately reported).   Evaluation of the patient requires complex antimicrobial therapy evaluation, counseling , isolation needs to reduce disease transmission and risk assessment and mitigation.     LOS: 3 days   Acey Lav 08/28/2023, 2:29 PM

## 2023-08-28 NOTE — Progress Notes (Signed)
 NAME:  Jeremiah Stevens, MRN:  [id number], DOB:  [birth year], LOS: 3 ADMISSION DATE:  08/25/2023, CONSULTATION DATE:  08/25/23 REFERRING MD:  Eloise Harman CHIEF COMPLAINT:  AMS   History of Present Illness:  Pt is encephelopathic; therefore, this HPI is obtained from chart review. Jeremiah Stevens is a 48 y.o. adult who has a PMH including but not limited to DM2 and HIV. He has not seen any providers in years and has been noncompliant with his medication regimen per his sister and brother. He lives at home with his brother.  He was in his normal state of health 2/11 and AM 2/12 when he at breakfast with his brother. Later that morning and into the afternoon, he was not his usual self and would have waxing and waning mental status along with mechanical falls. He progressed to the point of becoming minimally responsive and then had "clenched fingers and convulsions at home".  He does not have any prior hx of seizures.  He was brought to [facility] ED where he remained altered. Workup suggestive of HHS. He was started on fluids and insulin. He had head CT which was negative. CXR unremarkable.  Per brother and sister, he had not had any recent illness nor any complaints. Specifically denied any knowledge or recent fevers/chills/sweats, chest pain, cough, dyspnea, N/V/D, abd pain, myalgias, exposures to known sick contacts.  Given his AMS, PCCM asked to admit to ICU.  Pertinent  Medical History:  has Hyperosmolar hyperglycemic state (HHS) (HCC); Seizure (HCC); Acute respiratory failure with hypoxia (HCC); Acute encephalopathy; Hyperammonemia (HCC); and Pancytopenia (HCC) on their problem list.   Significant Hospital Events: Including procedures, antibiotic start and stop dates in addition to other pertinent events   2/12: admitted for HHS, initial eval by neurology  2/13: improved mental status today, still confused. EEG negative. Pending MRI brain. Started on keppra. ID seeing  2/14: cryptococcal  antigen highly positive. 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 Coverage started. MRI today and then IR for LP.   Interim History / Subjective:  Patient blood sugars are not well-controlled Other than that no overnight issues He passed swallow evaluation Cortrak was discontinued   Objective:  Blood pressure (!) 151/98, pulse 100, temperature 98.1 F (36.7 C), temperature source Oral, resp. rate 15, height 5\' 7"  (1.702 m), weight 121.5 kg, SpO2 97%.        Intake/Output Summary (Last 24 hours) at 08/28/2023 1239 Last data filed at 08/28/2023 1000 Gross per 24 hour  Intake 5573.49 ml  Output 2300 ml  Net 3273.49 ml   Filed Weights   08/25/23 1933 08/25/23 2300 08/27/23 0446  Weight: 122.5 kg 119.5 kg 121.5 kg    Examination: General: Chronically ill-appearing morbidly obese male, lying on the bed HEENT: [place]/AT, eyes anicteric.  moist mucus membranes Neuro: Alert, awake following commands Chest: Coarse breath sounds, no wheezes or rhonchi Heart: Regular rate and rhythm, no murmurs or gallops Abdomen: Soft, nontender, nondistended, bowel sounds present Skin: No rash  MRI brain: Showing no acute intracranial abnormality. LP opening pressure was 12   Resolved Hospital problems  Hyperosmolar hyperglycemic state, resolved   Assessment & Plan:  Type 2 diabetes, poorly controlled with hyperglycemia Pseudohyponatremia Presenting glucose 686, serum osmolality 345. BHB 0.27.  Ha1c 11.7.  Not taking antidiabetics at home.  Blood sugars remain elevated Started on Semglee 10 units twice daily Continue sliding scale insulin with CBG goal 140-180 Serum sodium trended down to 133, corrected with glucose, actual level is 135  Acute metabolic  encephalopathy in the setting of HHS New onset seizures  Patient had witnessed seizure at home, and then by EMS He was given 5mg  versed Likely seizure for due to HHS EEG was negative for active seizures MRI brain is negative for acute findings His  cryptococcal antigen was positive CSF PCR was negative for cryptococcal antigen, which rules out cryptococcal meningitis LP showed opening pressure of 12 Continue Keppra Mental status has significantly improved Avoid sedation  HIV with AIDS, noncompliant with medications Crypto cryptococcal, 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 Toxoplasma negative. Cryptococcus positive ID following Continue Amphotericin Flucytosine was stopped as cryptococcal meningitis was ruled out Continue Bactrim for PJP and toxo prophylaxis Continue azithromycin weekly for MAC prophylaxis Outpatient follow-up with ID  Polysubstance use disorder  U tox is positive for amphetamine and benzodiazepine per patient's sister he uses a lot of drugs including methamphetamine and cocaine  Closely watch for signs of withdrawal Substance abuse quit counseling provided  Pancytopenia Likely secondary to HIV with AIDS White count is 2.4, hemoglobin at 12.4 and platelet count 132 Closely monitor CBC  Acute kidney injury Serum creatinine again trended up, closely monitor Avoid nephrotoxic agent Repeat BMP in the morning  Morbid obesity Diet and exercise counseling provided  Best practice (evaluated daily):  Diet/type: Advance as tolerated DVT prophylaxis: prophylactic heparin  Pressure ulcer(s): pressure ulcer assessment deferred  GI prophylaxis: N/A Lines: N/A Foley:  N/A Code Status:  full code Last date of multidisciplinary goals of care discussion: updated sister over the phone 2/13  Labs   CBC: Recent Labs  Lab 08/25/23 1739 08/26/23 0933 08/27/23 0226  WBC 3.5* 2.6* 2.4*  NEUTROABS 2.7  --   --   HGB 13.5 11.6* 12.4*  HCT 39.4 33.3* 37.1*  MCV 86.0 85.2 87.1  PLT 166 123* 132*    Basic Metabolic Panel: Recent Labs  Lab 08/26/23 0328 08/26/23 0933 08/26/23 1145 08/27/23 0226 08/28/23 0804  NA 139 142 139 140 133*  K 4.0 3.7 4.1 4.0 3.6  CL 111 110 109 104 102  CO2 22 25 23 24  23   GLUCOSE 140* 95 129* 98 263*  BUN 16 14 15 12 12   CREATININE 1.01 0.99 1.04 1.01 1.35*  CALCIUM 8.3* 8.4* 8.4* 8.9 8.2*  MG  --  2.0  --  1.9 2.2  PHOS  --  3.3  --   --  4.3   GFR: Estimated Creatinine Clearance (by C-G formula based on SCr of 1.35 mg/dL (H)) Male: 16.1 mL/min (A) Male: 84.5 mL/min (A) Recent Labs  Lab 08/25/23 1739 08/26/23 0039 08/26/23 0933 08/27/23 0226  WBC 3.5*  --  2.6* 2.4*  LATICACIDVEN  --  1.7  --   --     Liver Function Tests: Recent Labs  Lab 08/25/23 1739  AST 31  ALT 26  ALKPHOS 91  BILITOT 0.6  PROT 9.1*  ALBUMIN 3.1*   No results for input(s): "LIPASE", "AMYLASE" in the last 168 hours. Recent Labs  Lab 08/26/23 0933  AMMONIA 51*    ABG    Component Value Date/Time   HCO3 26.6 08/26/2023 0931   ACIDBASEDEF 8.1 (H) 08/25/2023 1920   O2SAT 98.9 08/26/2023 0931     Coagulation Profile: No results for input(s): "INR", "PROTIME" in the last 168 hours.  Cardiac Enzymes: No results for input(s): "CKTOTAL", "CKMB", "CKMBINDEX", "TROPONINI" in the last 168 hours.  HbA1C: Hgb A1c MFr Bld  Date/Time Value Ref Range Status  08/26/2023 03:28 AM 11.7 (H)  4.8 - 5.6 % Final    Comment:    (NOTE) Pre diabetes:          5.7%-6.4%  Diabetes:              >6.4%  Glycemic control for   <7.0% adults with diabetes     CBG: Recent Labs  Lab 08/27/23 1937 08/28/23 0130 08/28/23 0407 08/28/23 0522 08/28/23 1133  GLUCAP 145* 238* 248* 233* 205*     Cheri Fowler, MD [facility] See Amion for pager If no response to pager, please call [phone] until 7pm After 7pm, Please call E-link [phone]

## 2023-08-28 NOTE — Plan of Care (Signed)
  Problem: Clinical Measurements: Goal: Diagnostic test results will improve Outcome: Progressing Goal: Respiratory complications will improve Outcome: Progressing   Problem: Activity: Goal: Risk for activity intolerance will decrease Outcome: Progressing   

## 2023-08-28 NOTE — Inpatient Diabetes Management (Signed)
 Inpatient Diabetes Program Recommendations  AACE/ADA: New Consensus Statement on Inpatient Glycemic Control (2015)  Target Ranges:  Prepandial:   less than 140 mg/dL      Peak postprandial:   less than 180 mg/dL (1-2 hours)      Critically ill patients:  140 - 180 mg/dL   Lab Results  Component Value Date   GLUCAP 233 (H) 08/28/2023   HGBA1C 11.7 (H) 08/26/2023    Review of Glycemic Control  Latest Reference Range & Units 08/27/23 07:31 08/27/23 13:00 08/27/23 16:51 08/27/23 19:37 08/28/23 01:30 08/28/23 04:07 08/28/23 05:22  Glucose-Capillary 70 - 99 mg/dL 469 (H) 629 (H) 528 (H) 145 (H) 238 (H) 248 (H) 233 (H)  (H): Data is abnormally high  Diabetes history: Type 2 DM Outpatient Diabetes medications: none Current orders for Inpatient glycemic control: Novolog 0-15 units Q4H, Osmolite @ 60 ml/hr  Inpatient Diabetes Program Recommendations:    Might consider Novolog 3 units Q4H tube feed coverage.  If feeds are discontinued or held, please hole tube feed coverage.  Will continue to follow while inpatient.  Thank you, Dulce Sellar, MSN, CDCES Diabetes Coordinator Inpatient Diabetes Program [phone] (team pager from 8a-5p)

## 2023-08-29 ENCOUNTER — Encounter (HOSPITAL_COMMUNITY): Payer: Self-pay | Admitting: *Deleted

## 2023-08-29 DIAGNOSIS — B459 Cryptococcosis, unspecified: Secondary | ICD-10-CM | POA: Diagnosis not present

## 2023-08-29 DIAGNOSIS — B20 Human immunodeficiency virus [HIV] disease: Secondary | ICD-10-CM | POA: Diagnosis not present

## 2023-08-29 LAB — BASIC METABOLIC PANEL
Anion gap: 10 (ref 5–15)
BUN: 12 mg/dL (ref 6–20)
CO2: 22 mmol/L (ref 22–32)
Calcium: 8.6 mg/dL — ABNORMAL LOW (ref 8.9–10.3)
Chloride: 103 mmol/L (ref 98–111)
Creatinine, Ser: 1.33 mg/dL — ABNORMAL HIGH (ref 0.61–1.24)
GFR, Estimated: >60 mL/min (ref >60)
Glucose, Bld: 130 mg/dL — ABNORMAL HIGH (ref 70–99)
Potassium: 3.9 mmol/L (ref 3.5–5.1)
Sodium: 135 mmol/L (ref 135–145)

## 2023-08-29 LAB — GLUCOSE, CAPILLARY
Glucose-Capillary: 158 mg/dL — ABNORMAL HIGH (ref 70–99)
Glucose-Capillary: 262 mg/dL — ABNORMAL HIGH (ref 70–99)
Glucose-Capillary: 216 mg/dL — ABNORMAL HIGH (ref 70–99)
Glucose-Capillary: 226 mg/dL — ABNORMAL HIGH (ref 70–99)
Glucose-Capillary: 156 mg/dL — ABNORMAL HIGH (ref 70–99)

## 2023-08-29 LAB — MAGNESIUM: Magnesium: 2 mg/dL (ref 1.7–2.4)

## 2023-08-29 MED ORDER — ACETAMINOPHEN 325 MG PO TABS
650.0000 mg | ORAL_TABLET | Freq: Four times a day (QID) | ORAL | Status: DC | PRN
  Administered 2023-08-29: 650 mg via ORAL
  Filled 2023-08-29: qty 2

## 2023-08-29 MED ORDER — ENOXAPARIN SODIUM 60 MG/0.6ML IJ SOSY
60.0000 mg | PREFILLED_SYRINGE | INTRAMUSCULAR | Status: DC
  Administered 2023-08-29 – 2023-09-02 (×5): 60 mg via SUBCUTANEOUS
  Filled 2023-08-29 (×5): qty 0.6

## 2023-08-29 MED ORDER — INSULIN ASPART 100 UNIT/ML IJ SOLN
0.0000 [IU] | Freq: Three times a day (TID) | INTRAMUSCULAR | Status: DC
  Administered 2023-08-29 (×2): 5 [IU] via SUBCUTANEOUS
  Administered 2023-08-30: 3 [IU] via SUBCUTANEOUS
  Administered 2023-08-30: 5 [IU] via SUBCUTANEOUS
  Administered 2023-08-30: 8 [IU] via SUBCUTANEOUS
  Administered 2023-08-31 (×3): 3 [IU] via SUBCUTANEOUS
  Administered 2023-09-01 (×2): 2 [IU] via SUBCUTANEOUS
  Administered 2023-09-01: 5 [IU] via SUBCUTANEOUS
  Administered 2023-09-02: 2 [IU] via SUBCUTANEOUS
  Administered 2023-09-02: 3 [IU] via SUBCUTANEOUS

## 2023-08-29 NOTE — Progress Notes (Signed)
 Jeremiah Stevens  OZH:[id number] DOB: 1975/07/25 DOA: 08/25/2023 PCP: Pcp, No    Brief Narrative:  48 year old with a history of HIV, obesity, and DM2 who does not routinely seek medical care and is noncompliant with his medication regimen who was noted to develop the acute onset of altered mental status 08/24/2022 associated with occasional mechanical falls.  After he suffered an episode of decreased responsiveness with clenched fist and convulsions at home he was brought to the ER for evaluation.  He was found to be in a hyperosmolar hyperglycemic state at presentation.  CT head was unrevealing.  CXR was unrevealing.  He was admitted to the ICU by PCCM.  Significant Events:  2/12 admitted with HHS 2/13 mental status improved -EEG unrevealing -Keppra initiated -ID consulted 2/14 cryptococcal antigen highly positive  2/15 MRI brain unrevealing - LP w/ negative CSF cryptococcal antigen 2/16 TR[facility] assumed care   Goals of Care:   Code Status: Full Code   DVT prophylaxis: heparin injection 5,000 Units Start: 08/25/23 2200 SCDs Start: 08/25/23 2116   Interim Hx: Afebrile.  Blood pressure elevated at 166 systolic and >100 diastolic.  Resting comfortably in bed.  Alert conversant and pleasant.  Denies any new complaints.  Assessment & Plan:  Uncontrolled DM2 with hyperosmolar hyperglycemic state A1c 11.7 -noncompliant with medical therapy in the outpatient setting -no longer in HHS but CBG still quite variable -adjust treatment regimen and follow  Metabolic encephalopathy due to HHS Resolved  New onset seizures Felt to have been incited by HHS -was dosed with Keppra initially with Neurology subsequently feeling that it is not not needed long-term  HIV with AIDS Noncompliant with medical therapy -CD4 43 -RPR negative -Toxoplasma negative -care per ID - Bactrim for PJP and toxo prophylaxis -azithromycin for MAC prophylaxis -will need outpatient follow-up in ID clinic  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  Polysubstance abuse UDS positive for amphetamines and benzodiazepines -family reported patient abuses methamphetamine and cocaine   Family Communication: Spoke with sister at bedside with permission from patient Disposition: To be determined once medically cleared from ID standpoint   Objective: Blood pressure (!) 166/107, pulse (!) 104, temperature 98.3 F (36.8 C), temperature source Oral, resp. rate 18, height 5\' 7"  (1.702 m), weight 119.3 kg, SpO2 96%.  Intake/Output Summary (Last 24 hours) at 08/29/2023 0931 Last data filed at 08/29/2023 0830 Gross per 24 hour  Intake 1486.94 ml  Output 1200 ml  Net 286.94 ml   Filed Weights   08/25/23 2300 08/27/23 0446 08/29/23 0412  Weight: 119.5 kg 121.5 kg 119.3 kg    Examination: General: No acute respiratory distress Lungs: Clear to auscultation bilaterally without wheezes or crackles Cardiovascular: Regular rate and rhythm without murmur gallop or rub normal S1 and S2 Abdomen: Nontender, nondistended, soft, bowel sounds positive, no rebound, no ascites, no appreciable mass Extremities: No significant cyanosis, clubbing, or edema bilateral lower extremities  CBC: Recent Labs  Lab 08/25/23 1739 08/26/23 0933 08/27/23 0226  WBC 3.5* 2.6* 2.4*  NEUTROABS 2.7  --   --   HGB 13.5 11.6* 12.4*  HCT 39.4 33.3* 37.1*  MCV 86.0 85.2 87.1  PLT 166 123* 132*   Basic  Metabolic Panel: Recent Labs  Lab 08/26/23 0933 08/26/23 1145 08/27/23 0226 08/28/23 0804 08/29/23 0317  NA 142   < > 140 133* 135  K 3.7   < > 4.0 3.6 3.9  CL 110   < > 104 102 103  CO2 25   < > 24 23 22    GLUCOSE 95   < > 98 263* 130*  BUN 14   < > 12 12 12   CREATININE 0.99   < > 1.01 1.35* 1.33*  CALCIUM 8.4*   < > 8.9 8.2* 8.6*  MG 2.0  --  1.9 2.2 2.0  PHOS 3.3  --   --  4.3  --    < > = values in this interval not displayed.   GFR: Estimated Creatinine Clearance (by C-G formula based on SCr of 1.33 mg/dL (H)) Male: 16.1 mL/min (A) Male: 84.9 mL/min (A)   Scheduled Meds:  azithromycin  1,200 mg Oral Weekly   Chlorhexidine Gluconate Cloth  6 each Topical Daily   dextrose  10 mL Intravenous Q24H   dextrose  10 mL Intravenous Q24H   feeding supplement (PROSource TF20)  60 mL Per Tube Daily   heparin  5,000 Units Subcutaneous Q8H   insulin aspart  0-15 Units Subcutaneous Q4H   insulin glargine-yfgn  10 Units Subcutaneous BID   lactulose  10 g Oral Daily   mouth rinse  15 mL Mouth Rinse 4 times per day   sulfamethoxazole-trimethoprim  1 tablet Oral Daily   Continuous Infusions:  amphotericin B liposome (AMBISOME) 350 mg in dextrose 5 % 500 mL IVPB 350 mg (08/29/23 0200)   feeding supplement (OSMOLITE 1.5 CAL) 30 mL/hr at 08/28/23 0400     LOS: 4 days   Lonia Blood, MD [facility] Office  [phone] Pager - Text Page per Loretha Stapler  If 7PM-7AM, please contact night-coverage per Amion 08/29/2023, 9:31 AM

## 2023-08-29 NOTE — Progress Notes (Addendum)
 Subjective:  No complaints  Antibiotics:  Anti-infectives (From admission, onward)    Start     Dose/Rate Route Frequency Ordered Stop   08/28/23 1000  azithromycin (ZITHROMAX) tablet 1,200 mg        1,200 mg Oral Weekly 08/27/23 1513     08/28/23 0200  amphotericin B liposome (AMBISOME) 350 mg in dextrose 5 % 500 mL IVPB        350 mg 293.8 mL/hr over 120 Minutes Intravenous Every 24 hours 08/27/23 1259     08/27/23 0000  flucytosine (ANCOBON) capsule 2,250 mg  Status:  Discontinued        25 mg/kg  87.5 kg (Adjusted) Oral Every 6 hours 08/26/23 2102 08/26/23 2143   08/27/23 0000  flucytosine (ANCOBON) capsule 1,750 mg  Status:  Discontinued        25 mg/kg  66.1 kg (Ideal) Oral Every 6 hours 08/26/23 2143 08/26/23 2246   08/27/23 0000  flucytosine (ANCOBON) capsule 2,000 mg  Status:  Discontinued        2,000 mg Oral Every 6 hours 08/26/23 2246 08/27/23 1837   08/26/23 2300  amphotericin B liposome (AMBISOME) 260 mg in dextrose 5 % 500 mL IVPB  Status:  Discontinued        3 mg/kg  87.5 kg (Adjusted) 282.5 mL/hr over 120 Minutes Intravenous Every 24 hours 08/26/23 2102 08/26/23 2146   08/26/23 2300  amphotericin B liposome (AMBISOME) 300 mg in dextrose 5 % 500 mL IVPB  Status:  Discontinued        300 mg 287.5 mL/hr over 120 Minutes Intravenous Every 24 hours 08/26/23 2146 08/27/23 1259   08/26/23 1445  bictegravir-emtricitabine-tenofovir AF (BIKTARVY) 50-200-25 MG per tablet 1 tablet  Status:  Discontinued        1 tablet Oral Daily 08/26/23 1349 08/27/23 0823   08/26/23 1445  sulfamethoxazole-trimethoprim (BACTRIM DS) 800-160 MG per tablet 1 tablet        1 tablet Oral Daily 08/26/23 1349         Medications: Scheduled Meds:  azithromycin  1,200 mg Oral Weekly   Chlorhexidine Gluconate Cloth  6 each Topical Daily   enoxaparin (LOVENOX) injection  60 mg Subcutaneous Q24H   feeding supplement (PROSource TF20)  60 mL Per Tube Daily   insulin aspart  0-15 Units  Subcutaneous TID WC   insulin glargine-yfgn  10 Units Subcutaneous BID   lactulose  10 g Oral Daily   mouth rinse  15 mL Mouth Rinse 4 times per day   sulfamethoxazole-trimethoprim  1 tablet Oral Daily   Continuous Infusions:  amphotericin B liposome (AMBISOME) 350 mg in dextrose 5 % 500 mL IVPB 350 mg (08/29/23 0200)   feeding supplement (OSMOLITE 1.5 CAL) 30 mL/hr at 08/28/23 0400   PRN Meds:.acetaminophen, dextrose, diphenhydrAMINE **OR** diphenhydrAMINE, docusate sodium, hydrALAZINE, meperidine (DEMEROL) injection, mouth rinse, polyethylene glycol    Objective: Weight change:   Intake/Output Summary (Last 24 hours) at 08/29/2023 1255 Last data filed at 08/29/2023 0830 Gross per 24 hour  Intake 1486.94 ml  Output 300 ml  Net 1186.94 ml   Blood pressure (!) 146/109, pulse 91, temperature 98.3 F (36.8 C), temperature source Oral, resp. rate 18, height 5\' 7"  (1.702 m), weight 119.3 kg, SpO2 96%. Temp:  [98 F (36.7 C)-98.9 F (37.2 C)] 98.3 F (36.8 C) (02/16 0830) Pulse Rate:  [91-106] 91 (02/16 1022) Resp:  [8-23] 18 (02/16 0830) BP: (143-166)/(85-109) 146/109 (02/16 1022) SpO2:  [  96 %-100 %] 96 % (02/16 0830) Weight:  [119.3 kg] 119.3 kg (02/16 0412)  Physical Exam: Physical Exam Constitutional:      General: She is not in acute distress.    Appearance: She is well-developed. She is not diaphoretic.  HENT:     Head: Normocephalic and atraumatic.     Right Ear: External ear normal.     Left Ear: External ear normal.     Mouth/Throat:     Pharynx: No oropharyngeal exudate.  Eyes:     General: No scleral icterus.    Conjunctiva/sclera: Conjunctivae normal.     Pupils: Pupils are equal, round, and reactive to light.  Cardiovascular:     Rate and Rhythm: Normal rate and regular rhythm.     Heart sounds: No murmur heard.    No gallop.  Pulmonary:     Effort: Pulmonary effort is normal. No respiratory distress.     Breath sounds: Normal breath sounds. No wheezing.   Abdominal:     General: Bowel sounds are normal. There is no distension.     Palpations: Abdomen is soft.     Tenderness: There is no abdominal tenderness. There is no rebound.  Musculoskeletal:        General: No tenderness. Normal range of motion.  Lymphadenopathy:     Cervical: No cervical adenopathy.  Skin:    General: Skin is warm and dry.     Coloration: Skin is not pale.     Findings: No erythema or rash.  Neurological:     General: No focal deficit present.     Mental Status: She is alert and oriented to person, place, and time.     Motor: No abnormal muscle tone.  Psychiatric:        Mood and Affect: Mood normal.        Behavior: Behavior normal.        Thought Content: Thought content normal.        Judgment: Judgment normal.      CBC:    BMET Recent Labs    08/28/23 0804 08/29/23 0317  NA 133* 135  K 3.6 3.9  CL 102 103  CO2 23 22  GLUCOSE 263* 130*  BUN 12 12  CREATININE 1.35* 1.33*  CALCIUM 8.2* 8.6*     Liver Panel  No results for input(s): "PROT", "ALBUMIN", "AST", "ALT", "ALKPHOS", "BILITOT", "BILIDIR", "IBILI" in the last 72 hours.      Sedimentation Rate No results for input(s): "ESRSEDRATE" in the last 72 hours. C-Reactive Protein No results for input(s): "CRP" in the last 72 hours.  Micro Results: Recent Results (from the past 720 hours)  Resp panel by RT-PCR (RSV, Flu A&B, Covid) Anterior Nasal Swab     Status: None   Collection Time: 08/25/23  7:20 PM   Specimen: Anterior Nasal Swab  Result Value Ref Range Status   SARS Coronavirus 2 by RT PCR NEGATIVE NEGATIVE Final   Influenza A by PCR NEGATIVE NEGATIVE Final   Influenza B by PCR NEGATIVE NEGATIVE Final    Comment: (NOTE) The Xpert Xpress SARS-CoV-2/FLU/RSV plus assay is intended as an aid in the diagnosis of influenza from Nasopharyngeal swab specimens and should not be used as a sole basis for treatment. Nasal washings and aspirates are unacceptable for Xpert Xpress  SARS-CoV-2/FLU/RSV testing.  Fact Sheet for Patients: BloggerCourse.com  Fact Sheet for Healthcare Providers: SeriousBroker.it  This test is not yet approved or cleared by the [place] FDA and has  been authorized for detection and/or diagnosis of SARS-CoV-2 by FDA under an Emergency Use Authorization (EUA). This EUA will remain in effect (meaning this test can be used) for the duration of the COVID-19 declaration under Section 564(b)(1) of the Act, 21 U.S.C. section 360bbb-3(b)(1), unless the authorization is terminated or revoked.     Resp Syncytial Virus by PCR NEGATIVE NEGATIVE Final    Comment: (NOTE) Fact Sheet for Patients: BloggerCourse.com  Fact Sheet for Healthcare Providers: SeriousBroker.it  This test is not yet approved or cleared by the [place] FDA and has been authorized for detection and/or diagnosis of SARS-CoV-2 by FDA under an Emergency Use Authorization (EUA). This EUA will remain in effect (meaning this test can be used) for the duration of the COVID-19 declaration under Section 564(b)(1) of the Act, 21 U.S.C. section 360bbb-3(b)(1), unless the authorization is terminated or revoked.  Performed at [facility], [address] [place], [place] [address]   MRSA Next Gen by PCR, Nasal     Status: None   Collection Time: 08/25/23 10:49 PM   Specimen: Nasal Mucosa; Nasal Swab  Result Value Ref Range Status   MRSA by PCR Next Gen NOT DETECTED NOT DETECTED Final    Comment: (NOTE) The GeneXpert MRSA Assay (FDA approved for NASAL specimens only), is one component of a comprehensive MRSA colonization surveillance program. It is not intended to diagnose MRSA infection nor to guide or monitor treatment for MRSA infections. Test performance is not FDA approved in patients less than 84 years old. Performed at [facility], [address] [place], [place] [address]   CSF culture w Gram Stain     Status: None (Preliminary result)   Collection Time: 08/27/23 12:12 PM   Specimen: PATH Cytology CSF; Cerebrospinal Fluid  Result Value Ref Range Status   Specimen Description CSF  Final   Special Requests NONE  Final   Gram Stain   Final    WBC PRESENT, PREDOMINANTLY MONONUCLEAR NO ORGANISMS SEEN CYTOSPIN SMEAR    Culture   Final    NO GROWTH 2 DAYS Performed at [facility], [address], [place], [place] [address]    Report Status PENDING  Incomplete  Culture, Fungus without Smear     Status: None (Preliminary result)   Collection Time: 08/27/23 12:12 PM   Specimen: PATH Cytology CSF; Cerebrospinal Fluid  Result Value Ref Range Status   Specimen Description CSF  Final   Special Requests NONE  Final   Culture   Final    NO FUNGUS ISOLATED AFTER 2 DAYS Performed at [facility], [address] [place], [place] [address]    Report Status PENDING  Incomplete  Anaerobic culture w Gram Stain     Status: None (Preliminary result)   Collection Time: 08/27/23 12:12 PM   Specimen: PATH Cytology CSF; Cerebrospinal Fluid  Result Value Ref Range Status   Specimen Description CSF  Final   Special Requests NONE  Final   Gram Stain   Final    WBC PRESENT, PREDOMINANTLY MONONUCLEAR NO ORGANISMS SEEN CYTOSPIN SMEAR Performed at [facility], [address] [place], [place] [address]    Culture PENDING  Incomplete   Report Status PENDING  Incomplete  Fungus culture, blood     Status: None (Preliminary result)   Collection Time: 08/27/23  8:15 PM   Specimen: BLOOD LEFT HAND  Result Value Ref Range Status   Specimen Description BLOOD LEFT HAND  Final   Special Requests   Final    BOTTLES DRAWN AEROBIC AND ANAEROBIC Blood Culture adequate volume   Culture   Final    NO GROWTH 2 DAYS Performed at [facility], [address], [place], [place] [address]    Report Status PENDING  Incomplete     Studies/Results: CT CHEST WO CONTRAST Result Date: 08/28/2023 CLINICAL DATA:  Fever of unknown origin EXAM: CT CHEST WITHOUT CONTRAST TECHNIQUE: Multidetector CT imaging of the chest was performed following the standard protocol without IV contrast. RADIATION DOSE REDUCTION: This exam was performed according to the departmental dose-optimization program which includes automated exposure control, adjustment of the mA and/or kV according to patient size and/or use of iterative reconstruction technique. COMPARISON:  10/04/2020, 08/02/2022 FINDINGS: Cardiovascular: Moderate coronary artery calcification. Global cardiac size within normal limits. No pericardial effusion. Central pulmonary arteries are of normal caliber. Thoracic aorta is unremarkable. Right upper extremity PICC line has been placed with its tip within the superior vena cava. Mediastinum/Nodes: Interval development of mediastinal adenopathy with index lymph nodes measuring 17 mm in short axis diameter within the subcarinal region, nonspecific, possibly or inflammatory in nature. The esophagus is unremarkable. Visualized thyroid is unremarkable. Lungs/Pleura: Stable 6 mm sub solid pulmonary nodule within the a right upper lobe since remote prior examination, axial image # 70/4, benign. Interval development of sparse ground-glass peribronchial pulmonary infiltrate within the right upper lobe, likely infectious or inflammatory in the acute setting. Interval development of an rounded 18 mm nodule within the paramediastinal left lower lobe, axial image # 101/4, indeterminate. Lungs are otherwise clear. Previously noted consolidation of the right lower lobe has resolved. No pneumothorax or pleural effusion. No central obstructing lesion. Upper Abdomen: No acute abnormality. Musculoskeletal: No chest wall mass or suspicious bone lesions identified. IMPRESSION: 1. Interval development of sparse ground-glass peribronchial pulmonary infiltrate within the  right upper lobe, likely infectious or inflammatory in the acute setting. Probable associated mediastinal reactive adenopathy. 2. Interval development of an rounded 18 mm nodule within the paramediastinal left lower lobe, indeterminate. Consider one of the following in 3 months for both low-risk and high-risk individuals: (a) repeat chest CT, (b) follow-up PET-CT, or (c) tissue sampling. This recommendation follows the consensus statement: Guidelines for Management of Incidental Pulmonary Nodules Detected on CT Images: From the Fleischner Society 2017; Radiology 2017; 284:228-243. 3. Moderate coronary artery calcification. Electronically Signed   By: Helyn Numbers M.D.   On: 08/28/2023 23:23   [place] EKG SITE RITE Result Date: 08/27/2023 If Site Rite image not attached, placement could not be confirmed due to current cardiac rhythm.     Assessment/Plan:  INTERVAL HISTORY: Cultures remain negative from blood and CSF  Principal Problem:   Cryptococcosis (HCC) Active Problems:   Hyperosmolar hyperglycemic state (HHS) (HCC)   Seizure (HCC)   Acute respiratory failure with hypoxia (HCC)   Acute encephalopathy   Hyperammonemia (HCC)   Pancytopenia (HCC)    Jeremiah Stevens is a 48 y.o. adult transgender male with advanced HIV, AIDS who was admitted with seizures in the setting of hyperglycemia and found to have a positive serum cryptococcal antigen.  Lumbar puncture though did not show cryptococcus antigen and goddess encephalitis panel was also negative.  She is also without headaches.  Fungal blood cultures were ordered last night by Dr. Drue Second  CSF cultures are no growth so far  #1 Cryptococcal infection: I suspect that patient has disease but NOT CNS disease. If she were fungemic that  would also require amphotericin  CT scan I ordered yesterday showed groundglass peribronchial pulmonary infiltrate as well as a rounded 8 mm nodule within the paramediastinal left lobe and some  moderate coraonary calcification  I suspect this pulmonary pathology is due to cryptococcus  I went back to IDSA guidelines and found following recommendation  "For cryptococcemia or dissemination (involvement of at least 2 noncontiguous sites or evidence of high fungal burden based on cryptococcal antigen titer ?1:512), treat as CNS disease (B-III)"  She WOULD meet the criteria of Ag > 1:512  I was going to change to fluconazole today but will keep the amphotericin going for now which she is tolerating well  #2 HIV/AIDS:   Think we can restart her Biktarvy soon and since there is no evidence of CNS involvement.  I will ensure that she is on Bactrim for PCP prevention. ds to reduce disease transmission and risk assessment and mitigation.   #3  coronary calcification seen in the patient is greater than 40 and HIV positive and therefore should be on a statin. Will need to navigate interacton with fluconazole when we choose one  #4 Transgender male to male: would likely add aldactone estradiol but not yet during hospitalization  I have personally spent 54 minutes involved in face-to-face and non-face-to-face activities for this patient on the day of the visit. Professional time spent includes the following activities: Preparing to see the patient (review of tests), Obtaining and/or reviewing separately obtained history (admission/discharge record), Performing a medically appropriate examination and/or evaluation , Ordering medications/tests/procedures, referring and communicating with other health care professionals, Documenting clinical information in the EMR, Independently interpreting results (not separately reported), Communicating results to the patient/family/caregiver, Counseling and educating the patient/family/caregiver and Care coordination (not separately reported).   Evaluation of the patient requires complex antimicrobial therapy evaluation, counseling , isolation needs to reduce  disease transmission and risk assessment and mitigation.     LOS: 4 days   Acey Lav 08/29/2023, 12:55 PM

## 2023-08-29 NOTE — Plan of Care (Signed)
  Problem: Clinical Measurements: Goal: Ability to maintain clinical measurements within normal limits will improve Outcome: Progressing Goal: Diagnostic test results will improve Outcome: Progressing Goal: Respiratory complications will improve Outcome: Progressing   Problem: Activity: Goal: Risk for activity intolerance will decrease Outcome: Progressing   

## 2023-08-30 ENCOUNTER — Encounter (HOSPITAL_COMMUNITY): Payer: Self-pay

## 2023-08-30 DIAGNOSIS — B459 Cryptococcosis, unspecified: Secondary | ICD-10-CM | POA: Diagnosis not present

## 2023-08-30 DIAGNOSIS — B20 Human immunodeficiency virus [HIV] disease: Secondary | ICD-10-CM | POA: Diagnosis not present

## 2023-08-30 DIAGNOSIS — E1100 Type 2 diabetes mellitus with hyperosmolarity without nonketotic hyperglycemic-hyperosmolar coma (NKHHC): Secondary | ICD-10-CM | POA: Diagnosis not present

## 2023-08-30 DIAGNOSIS — R569 Unspecified convulsions: Secondary | ICD-10-CM | POA: Diagnosis not present

## 2023-08-30 LAB — COMPREHENSIVE METABOLIC PANEL
ALT: 36 U/L (ref 0–44)
AST: 39 U/L (ref 15–41)
Albumin: 2.7 g/dL — ABNORMAL LOW (ref 3.5–5.0)
Alkaline Phosphatase: 67 U/L (ref 38–126)
Anion gap: 10 (ref 5–15)
BUN: 21 mg/dL — ABNORMAL HIGH (ref 6–20)
CO2: 22 mmol/L (ref 22–32)
Calcium: 8.3 mg/dL — ABNORMAL LOW (ref 8.9–10.3)
Chloride: 102 mmol/L (ref 98–111)
Creatinine, Ser: 1.93 mg/dL — ABNORMAL HIGH (ref 0.61–1.24)
GFR, Estimated: 42 mL/min — ABNORMAL LOW (ref >60)
Glucose, Bld: 292 mg/dL — ABNORMAL HIGH (ref 70–99)
Potassium: 4.3 mmol/L (ref 3.5–5.1)
Sodium: 134 mmol/L — ABNORMAL LOW (ref 135–145)
Total Bilirubin: 0.6 mg/dL (ref 0.0–1.2)
Total Protein: 7.8 g/dL (ref 6.5–8.1)

## 2023-08-30 LAB — CBC
HCT: 35.1 % — ABNORMAL LOW (ref 39.0–52.0)
Hemoglobin: 11.9 g/dL — ABNORMAL LOW (ref 13.0–17.0)
MCH: 29.5 pg (ref 26.0–34.0)
MCHC: 33.9 g/dL (ref 30.0–36.0)
MCV: 87.1 fL (ref 80.0–100.0)
Platelets: 126 10*3/uL — ABNORMAL LOW (ref 150–400)
RBC: 4.03 MIL/uL — ABNORMAL LOW (ref 4.22–5.81)
RDW: 13.5 % (ref 11.5–15.5)
WBC: 2.3 10*3/uL — ABNORMAL LOW (ref 4.0–10.5)
nRBC: 0 % (ref 0.0–0.2)

## 2023-08-30 LAB — GLUCOSE, CAPILLARY
Glucose-Capillary: 232 mg/dL — ABNORMAL HIGH (ref 70–99)
Glucose-Capillary: 192 mg/dL — ABNORMAL HIGH (ref 70–99)
Glucose-Capillary: 274 mg/dL — ABNORMAL HIGH (ref 70–99)
Glucose-Capillary: 234 mg/dL — ABNORMAL HIGH (ref 70–99)

## 2023-08-30 LAB — CSF CULTURE W GRAM STAIN: Culture: NO GROWTH

## 2023-08-30 MED ORDER — DILTIAZEM HCL 30 MG PO TABS
30.0000 mg | ORAL_TABLET | Freq: Four times a day (QID) | ORAL | Status: DC
Start: 2023-08-30 — End: 2023-08-31
  Administered 2023-08-30 – 2023-08-31 (×4): 30 mg via ORAL
  Filled 2023-08-30 (×5): qty 1

## 2023-08-30 MED ORDER — INSULIN STARTER KIT- PEN NEEDLES (ENGLISH)
1.0000 | Freq: Once | Status: AC
  Administered 2023-08-30: 1
  Filled 2023-08-30: qty 1

## 2023-08-30 MED ORDER — SODIUM CHLORIDE 0.9 % IV BOLUS FOR AMBISOME: 500.0000 mL | INTRAVENOUS | Status: DC

## 2023-08-30 MED ORDER — SODIUM CHLORIDE 0.9 % IV BOLUS
500.0000 mL | Freq: Once | INTRAVENOUS | Status: AC
  Administered 2023-08-30: 500 mL via INTRAVENOUS

## 2023-08-30 MED ORDER — FLUCONAZOLE IN SODIUM CHLORIDE 400-0.9 MG/200ML-% IV SOLN
800.0000 mg | INTRAVENOUS | Status: DC
  Administered 2023-08-31 – 2023-09-01 (×2): 800 mg via INTRAVENOUS
  Filled 2023-08-30 (×2): qty 400

## 2023-08-30 MED ORDER — LIVING WELL WITH DIABETES BOOK
Freq: Once | Status: AC
  Filled 2023-08-30: qty 1

## 2023-08-30 MED ORDER — SODIUM CHLORIDE 0.9 % IV SOLN: INTRAVENOUS | Status: DC

## 2023-08-30 MED ORDER — INSULIN GLARGINE-YFGN 100 UNIT/ML ~~LOC~~ SOLN
12.0000 [IU] | Freq: Two times a day (BID) | SUBCUTANEOUS | Status: DC
  Administered 2023-08-30 – 2023-09-02 (×7): 12 [IU] via SUBCUTANEOUS
  Filled 2023-08-30 (×8): qty 0.12

## 2023-08-30 NOTE — Progress Notes (Signed)
 Nutrition Follow-up  DOCUMENTATION CODES:   Obesity unspecified  INTERVENTION:   Encourage PO intake and protein intake on carb modified diet Monitor PO intake and add supplements as necessary, consider Glucerna   NUTRITION DIAGNOSIS:   Inadequate oral intake related to acute illness as evidenced by NPO status.  - Progressed now on carb modified diet   GOAL:   Patient will meet greater than or equal to 90% of their needs  - Meeting   MONITOR:   Diet advancement, TF tolerance, Weight trends, Labs, Skin  REASON FOR ASSESSMENT:   Rounds    ASSESSMENT:   48 yo admitted with hyperosmolar hyperglycemia, acute metabolic encephalopathy, new onset seizures, cryptococcal infection. PMH includes Type 2 DM, HIV with possible AIDS.  2/12 Admitted  2/14 - Cortrak placed 2/15 - Cortrak removed, Passed swallow eval, carb modified diet    Tube feeds started 2/14 and ended 2/15 for diet advancement. Patient with resolving metabolic encephalopathy, was able to provide some nutritional history. Eats good at home, 3 meals per day, no concerns for weight loss, UBW to be around 260-270 lbs. Eating 100% of  meals since diet advanced. Had 75% of breakfast. No N/V noted.   If PO intake decreases, may want to consider addition of Glucerna as A1C Is 11.7 and BG not well controlled.  Noted pt with hx of substance use; sister states pt uses a lot of party drugs/uppers including cocaine and meth   Admit weight: 119.5 kg Current weight: 119.2 kg    Average Meal Intake: 2/15-2/17: 100% intake x 3 recorded meals  Nutritionally Relevant Medications: Scheduled Meds:  insulin aspart  0-15 Units Subcutaneous TID WC   insulin glargine-yfgn  12 Units Subcutaneous BID   lactulose  10 g Oral Daily   Continuous Infusions:  sodium chloride 150 mL/hr at 08/30/23 1032   [START ON 08/31/2023] fluconazole (DIFLUCAN) IV     Labs Reviewed: Sodium 134, BUN 21, Creatinine 1.93, Calcium 8.3  CBG ranges  from 156-232 mg/dL over the last 24 hours HgbA1c 11.7  NUTRITION - FOCUSED PHYSICAL EXAM:  Flowsheet Row Most Recent Value  Orbital Region Mild depletion  Upper Arm Region No depletion  Thoracic and Lumbar Region No depletion  Buccal Region Mild depletion  Temple Region Mild depletion  Clavicle Bone Region No depletion  Clavicle and Acromion Bone Region No depletion  Scapular Bone Region No depletion  Dorsal Hand No depletion  Patellar Region No depletion  Anterior Thigh Region No depletion  Posterior Calf Region No depletion  Edema (RD Assessment) Mild  Hair Reviewed  Eyes Reviewed  Mouth Reviewed  Skin Reviewed  Nails Reviewed       Diet Order:   Diet Order             Diet Carb Modified Fluid consistency: Thin  Diet effective now                   EDUCATION NEEDS:   Education needs have been addressed  Skin:  Skin Assessment: Skin Integrity Issues: Skin Integrity Issues:: Other (Comment) Other: non pressure wound  Last BM:  no documented BM  Height:   Ht Readings from Last 1 Encounters:  08/25/23 5\' 7"  (1.702 m)    Weight:   Wt Readings from Last 1 Encounters:  08/30/23 119.2 kg    BMI:  Body mass index is 41.16 kg/m.  Estimated Nutritional Needs:   Kcal:  2000-2200 kcals  Protein:  100-115 g  Fluid:  >/=  2L  Elliot Dally, RD Registered Dietitian  See Amion for more information

## 2023-08-30 NOTE — Plan of Care (Signed)
  Problem: Clinical Measurements: Goal: Will remain free from infection Outcome: Progressing Goal: Respiratory complications will improve Outcome: Progressing Goal: Cardiovascular complication will be avoided Outcome: Progressing   Problem: Activity: Goal: Risk for activity intolerance will decrease Outcome: Progressing   

## 2023-08-30 NOTE — TOC Initial Note (Signed)
 Transition of Care [facility]) - Initial/Assessment Note    Patient Details  Name: Jeremiah Stevens MRN: [id number] Date of Birth: February 26, 1976  [facility]) CM/SW Contact:    Jeremiah Lewandowsky, RN Phone Number: 08/30/2023, 4:12 PM  Clinical Narrative:  Patient presented  for altered mental status. PTA patient was from home with her brother. Patient states PCP is Judyann Munson with Infectious Disease. Case Manager received consult for medication assistance and patient states that her meds are mostly free of charge. No home needs identified at this time. Case Manager will continue to follow for additional transition of care needs.              Expected Discharge Plan: Home/Self Care Barriers to Discharge: No Barriers Identified   Patient Goals and CMS Choice Patient states their goals for this hospitalization and ongoing recovery are:: plan to return home with brother.   Choice offered to / list presented to : NA      Expected Discharge Plan and Services In-house Referral: NA Discharge Planning Services: CM Consult Post Acute Care Choice: NA                     DME Agency: NA  Prior Living Arrangements/Services   Lives with:: Siblings Patient language and need for interpreter reviewed:: Yes Do you feel safe going back to the place where you live?: Yes        Care giver support system in place?: No (comment)   Criminal Activity/Legal Involvement Pertinent to Current Situation/Hospitalization: No - Comment as needed  Activities of Daily Living   ADL Screening (condition at time of admission) Independently performs ADLs?: Yes (appropriate for developmental age) Is the patient deaf or have difficulty hearing?: No Does the patient have difficulty seeing, even when wearing glasses/contacts?: No Does the patient have difficulty concentrating, remembering, or making decisions?: No  Permission Sought/Granted Permission sought to share information with :  Family Supports, Case Manager   Emotional Assessment Appearance:: Appears stated age Attitude/Demeanor/Rapport: Engaged Affect (typically observed): Appropriate Orientation: : Oriented to Self, Oriented to Place, Oriented to  Time, Oriented to Situation Alcohol / Substance Use: Not Applicable Psych Involvement: No (comment)  Admission diagnosis:  Hyperosmolar hyperglycemic state (HHS) (HCC) [E11.00] Patient Active Problem List   Diagnosis Date Noted   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 [place] [address] Phone: [phone] Fax: [phone]  Social Drivers of Health (SDOH) Social History: SDOH Screenings   Food Insecurity: Patient Unable To Answer (08/28/2023)  Housing: Patient Unable To Answer (08/28/2023)  Transportation Needs: Patient Unable To Answer (08/28/2023)  Utilities: Patient Unable To Answer (08/28/2023)   Readmission Risk Interventions     No data to display

## 2023-08-30 NOTE — Progress Notes (Signed)
 Jeremiah Stevens  ZHY:[id number] DOB: Jan 03, 1976 DOA: 08/25/2023 PCP: Pcp, No    Brief Narrative:  48 year old with a history of HIV, obesity, and DM2 who does not routinely seek medical care and is noncompliant with his prescribed medication regimen who was noted to develop the acute onset of altered mental status 08/24/2022 associated with occasional mechanical falls.  After he suffered an episode of decreased responsiveness with clenched fist and convulsions at home he was brought to the ER for evaluation.  He was found to be in a hyperosmolar hyperglycemic state at presentation.  CT head was unrevealing.  CXR was unrevealing.  He was admitted to the ICU by PCCM.  Significant Events:  2/12 admitted with HHS 2/13 mental status improved -EEG unrevealing -Keppra initiated -ID consulted 2/14 cryptococcal antigen highly positive  2/15 MRI brain unrevealing - LP w/ negative CSF cryptococcal antigen 2/16 TRH assumed care   Goals of Care:   Code Status: Full Code   DVT prophylaxis: SCDs Start: 08/25/23 2116   Interim Hx: Afebrile.  Blood pressure remains elevated with systolic 168.  Vitals otherwise stable.  CBG above goal at 226-232.  Resting comfortably in bed.  Denies any new complaints.  Is in good spirits today.  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  Polysubstance abuse UDS positive for amphetamines and benzodiazepines -family reported patient abuses methamphetamine and cocaine   Family Communication: No family present at time of exam today Disposition: To be determined once medically cleared from ID standpoint   Objective: Blood pressure (!) 168/104, pulse 89, temperature 97.9 F (36.6 C), temperature source Oral, resp. rate 18, height 5\' 7"  (1.702 m), weight 119.2 kg, SpO2 100%. No intake or output data in the 24 hours ending 08/30/23 0922  Filed Weights   08/27/23 0446 08/29/23 0412 08/30/23 0456  Weight: 121.5 kg 119.3 kg 119.2 kg    Examination: General: No acute respiratory distress Lungs: Clear to auscultation bilaterally without wheezes or crackles Cardiovascular: Regular rate and rhythm  Abdomen: NT/ND, soft, BS positive, no rebound Extremities: No significant cyanosis, clubbing, or edema bilateral lower extremities  CBC: Recent Labs  Lab 08/25/23 1739 08/26/23 0933 08/27/23 0226 08/30/23 0353  WBC 3.5* 2.6* 2.4* 2.3*  NEUTROABS 2.7  --   --   --   HGB 13.5 11.6* 12.4* 11.9*  HCT 39.4 33.3* 37.1* 35.1*  MCV 86.0 85.2 87.1 87.1  PLT 166 123*  132* 126*   Basic Metabolic Panel: Recent Labs  Lab 08/26/23 0933 08/26/23 1145 08/27/23 0226 08/28/23 0804 08/29/23 0317 08/30/23 0353  NA 142   < > 140 133* 135 134*  K 3.7   < > 4.0 3.6 3.9 4.3  CL 110   < > 104 102 103 102  CO2 25   < > 24 23 22  22   GLUCOSE 95   < > 98 263* 130* 292*  BUN 14   < > 12 12 12  21*  CREATININE 0.99   < > 1.01 1.35* 1.33* 1.93*  CALCIUM 8.4*   < > 8.9 8.2* 8.6* 8.3*  MG 2.0  --  1.9 2.2 2.0  --   PHOS 3.3  --   --  4.3  --   --    < > = values in this interval not displayed.   GFR: Estimated Creatinine Clearance (by C-G formula based on SCr of 1.93 mg/dL (H)) Male: 16.1 mL/min (A) Male: 58.4 mL/min (A)   Scheduled Meds:  azithromycin  1,200 mg Oral Weekly   Chlorhexidine Gluconate Cloth  6 each Topical Daily   enoxaparin (LOVENOX) injection  60 mg Subcutaneous Q24H   insulin aspart  0-15 Units Subcutaneous TID WC   insulin glargine-yfgn  10 Units Subcutaneous BID   lactulose  10 g Oral Daily   mouth rinse  15 mL Mouth Rinse 4 times per day   sulfamethoxazole-trimethoprim  1 tablet Oral Daily   Continuous Infusions:  amphotericin B liposome (AMBISOME) 350 mg in dextrose 5 % 500 mL IVPB 350 mg (08/30/23 0226)     LOS: 5 days   Lonia Blood, MD [facility] Office  [phone] Pager - Text Page per Loretha Stapler  If 7PM-7AM, please contact night-coverage per Amion 08/30/2023, 9:22 AM

## 2023-08-30 NOTE — Inpatient Diabetes Management (Signed)
 Inpatient Diabetes Program Recommendations  AACE/ADA: New Consensus Statement on Inpatient Glycemic Control (2015)  Target Ranges:  Prepandial:   less than 140 mg/dL      Peak postprandial:   less than 180 mg/dL (1-2 hours)      Critically ill patients:  140 - 180 mg/dL   Lab Results  Component Value Date   GLUCAP 192 (H) 08/30/2023   HGBA1C 11.7 (H) 08/26/2023    Review of Glycemic Control  Latest Reference Range & Units 08/29/23 21:11 08/30/23 08:54 08/30/23 12:11  Glucose-Capillary 70 - 99 mg/dL 161 (H) 096 (H) 045 (H)   Diabetes history: DM 2 Outpatient Diabetes medications: None noted Current orders for Inpatient glycemic control:  Novolog 0-15 units tid with meals Semglee 12 units daily  Inpatient Diabetes Program Recommendations:    Spoke with patient regarding A1C of 11.7%.  She states that she was on insulin in the past but stopped taking it (not clear why).  Discussed current elevated A1C and likely need for insulin to be restarted at home.  In the past, she used insulin pens and is comfortable with use.  Also patient will need meter, monitoring supplies, and f/u with PCP after d/c.  Will continue to follow.   Thanks , Lorenza Cambridge, RN, BC-ADM Inpatient Diabetes Coordinator Pager [phone]  (8a-5p)

## 2023-08-30 NOTE — Progress Notes (Signed)
 [facility]  Date of Admission:  08/25/2023      Total days of antibiotics 5   Amphotericin 2/13 >>  Flucytosine 2/13 >> 2/15         ASSESSMENT: Jeremiah Stevens is a 48 y.o. adult transfemale here with cryptococcal infection in the setting of advanced HIV / AIDS currently not on treatment.    Cryptococcal Infection -  Titer 1:640 --> 1:80 after 24h -  ME panel and CSF antigen negative but high serum titer. Fungal cultures from CSF so far without growth. AKI on amphotericin d/t IV boluses being stopped 48h ago. Will transition to high dose fluconazole and follow clinically. Suspect she will need at least 8 weeks of high dose 800 mg daily then maintenance treatment 400 mg  -Resume ART as below.  -stop l-ampho and start fluconazole 800 mg every day   AKI -  1 >> 1.93 - Due to IV fluid boluses being discontinued in the setting of amphotericin.  -Give 1 L fluids now.  -Follow daily creatinines and urine output.   HIV / AIDS, CD4 28 -  She is not opposed to resuming her ART outpatient. Main barrier was transportation to the pharmacy. With Medicaid, we could plan to mail them to her monthly. She will consider.  With ruling out CNS involvement will plan to resume Biktarvy once a day for treatment. Continue bactrim for PJP prophylaxis. RPR nonreactive.   Medication Monitoring -  QTc 428 - will repeat another EKG before discharge.  LFTs prior to D/C   CAD -  Coronary calcifications seen on scan - defer statin until lower dose fluconazole to avoid side effects. Would benefit from statin per REPRIEVE trial data.   Diabetes -  A1C 11%+    PLAN: Stop ampho Start fluconazole 800 mg daily  LFTs and repeat EKG prior to D/C Follow closely for any changes  Resume biktarvy daily  Continue bactrim prophy 1 L IVF over an hour now.  Daily BMP  Principal Problem:   Cryptococcosis (HCC) Active Problems:   Hyperosmolar hyperglycemic state  (HHS) (HCC)   Seizure (HCC)   Acute respiratory failure with hypoxia (HCC)   Acute encephalopathy   Hyperammonemia (HCC)   Pancytopenia (HCC)    azithromycin  1,200 mg Oral Weekly   Chlorhexidine Gluconate Cloth  6 each Topical Daily   diltiazem  30 mg Oral Q6H   enoxaparin (LOVENOX) injection  60 mg Subcutaneous Q24H   insulin aspart  0-15 Units Subcutaneous TID WC   insulin glargine-yfgn  12 Units Subcutaneous BID   lactulose  10 g Oral Daily   mouth rinse  15 mL Mouth Rinse 4 times per day   sulfamethoxazole-trimethoprim  1 tablet Oral Daily    SUBJECTIVE: Doing well today. No changes or new symptoms to declare. Gets a little off balance sometimes but not new.    Review of Systems: Review of Systems  Constitutional:  Negative for fever, malaise/fatigue and weight loss.  Respiratory:  Negative for cough.   Cardiovascular:  Negative for chest pain.  Gastrointestinal:  Negative for abdominal pain and vomiting.  Musculoskeletal:  Negative for back pain and neck pain.  Skin:  Negative for rash.     No Known Allergies  OBJECTIVE: Vitals:   08/29/23 1816 08/29/23 2107 08/30/23 0456 08/30/23 0852  BP: (!) 147/104 (!) 148/88 (!) 166/109 (!) 168/104  Pulse: 86 98 90 89  Resp: 17 18 18  18  Temp: 98.3 F (36.8 C) 98.2 F (36.8 C) 98 F (36.7 C) 97.9 F (36.6 C)  TempSrc: Oral Oral Oral Oral  SpO2: 99% 100% 98% 100%  Weight:   119.2 kg   Height:       Body mass index is 41.16 kg/m.  Physical Exam Vitals reviewed.  Constitutional:      Appearance: Normal appearance. She is not ill-appearing.  HENT:     Head: Normocephalic.     Mouth/Throat:     Mouth: Mucous membranes are moist.     Pharynx: Oropharynx is clear.  Eyes:     General: No scleral icterus. Cardiovascular:     Rate and Rhythm: Normal rate and regular rhythm.  Pulmonary:     Effort: Pulmonary effort is normal.  Musculoskeletal:        General: Normal range of motion.     Cervical back: Normal  range of motion.  Skin:    Coloration: Skin is not jaundiced or pale.  Neurological:     Mental Status: She is alert and oriented to person, place, and time.  Psychiatric:        Mood and Affect: Mood normal.        Judgment: Judgment normal.     Lab Results Lab Results  Component Value Date   WBC 2.3 (L) 08/30/2023   HGB 11.9 (L) 08/30/2023   HCT 35.1 (L) 08/30/2023   MCV 87.1 08/30/2023   PLT 126 (L) 08/30/2023    Lab Results  Component Value Date   CREATININE 1.93 (H) 08/30/2023   BUN 21 (H) 08/30/2023   NA 134 (L) 08/30/2023   K 4.3 08/30/2023   CL 102 08/30/2023   CO2 22 08/30/2023    Lab Results  Component Value Date   ALT 36 08/30/2023   AST 39 08/30/2023   ALKPHOS 67 08/30/2023   BILITOT 0.6 08/30/2023     Microbiology: Recent Results (from the past 240 hours)  Resp panel by RT-PCR (RSV, Flu A&B, Covid) Anterior Nasal Swab     Status: None   Collection Time: 08/25/23  7:20 PM   Specimen: Anterior Nasal Swab  Result Value Ref Range Status   SARS Coronavirus 2 by RT PCR NEGATIVE NEGATIVE Final   Influenza A by PCR NEGATIVE NEGATIVE Final   Influenza B by PCR NEGATIVE NEGATIVE Final    Comment: (NOTE) The Xpert Xpress SARS-CoV-2/FLU/RSV plus assay is intended as an aid in the diagnosis of influenza from Nasopharyngeal swab specimens and should not be used as a sole basis for treatment. Nasal washings and aspirates are unacceptable for Xpert Xpress SARS-CoV-2/FLU/RSV testing.  Fact Sheet for Patients: BloggerCourse.com  Fact Sheet for Healthcare Providers: SeriousBroker.it  This test is not yet approved or cleared by the [place] FDA and has been authorized for detection and/or diagnosis of SARS-CoV-2 by FDA under an Emergency Use Authorization (EUA). This EUA will remain in effect (meaning this test can be used) for the duration of the COVID-19 declaration under Section 564(b)(1) of the Act, 21  U.S.C. section 360bbb-3(b)(1), unless the authorization is terminated or revoked.     Resp Syncytial Virus by PCR NEGATIVE NEGATIVE Final    Comment: (NOTE) Fact Sheet for Patients: BloggerCourse.com  Fact Sheet for Healthcare Providers: SeriousBroker.it  This test is not yet approved or cleared by the [place] FDA and has been authorized for detection and/or diagnosis of SARS-CoV-2 by FDA under an Emergency Use Authorization (EUA). This EUA will remain in  effect (meaning this test can be used) for the duration of the COVID-19 declaration under Section 564(b)(1) of the Act, 21 U.S.C. section 360bbb-3(b)(1), unless the authorization is terminated or revoked.  Performed at [facility], [address], [place], [place] [address]   MRSA Next Gen by PCR, Nasal     Status: None   Collection Time: 08/25/23 10:49 PM   Specimen: Nasal Mucosa; Nasal Swab  Result Value Ref Range Status   MRSA by PCR Next Gen NOT DETECTED NOT DETECTED Final    Comment: (NOTE) The GeneXpert MRSA Assay (FDA approved for NASAL specimens only), is one component of a comprehensive MRSA colonization surveillance program. It is not intended to diagnose MRSA infection nor to guide or monitor treatment for MRSA infections. Test performance is not FDA approved in patients less than 51 years old. Performed at [facility], [address] [place], [place] [address]   CSF culture w Gram Stain     Status: None (Preliminary result)   Collection Time: 08/27/23 12:12 PM   Specimen: PATH Cytology CSF; Cerebrospinal Fluid  Result Value Ref Range Status   Specimen Description CSF  Final   Special Requests NONE  Final   Gram Stain   Final    WBC PRESENT, PREDOMINANTLY MONONUCLEAR NO ORGANISMS SEEN CYTOSPIN SMEAR    Culture   Final    NO GROWTH 2 DAYS Performed at [facility], [address], [place], [place] [address]    Report Status  PENDING  Incomplete  Culture, Fungus without Smear     Status: None (Preliminary result)   Collection Time: 08/27/23 12:12 PM   Specimen: PATH Cytology CSF; Cerebrospinal Fluid  Result Value Ref Range Status   Specimen Description CSF  Final   Special Requests NONE  Final   Culture   Final    NO FUNGUS ISOLATED AFTER 2 DAYS Performed at [facility], [address] [place], [place] [address]    Report Status PENDING  Incomplete  Anaerobic culture w Gram Stain     Status: None (Preliminary result)   Collection Time: 08/27/23 12:12 PM   Specimen: PATH Cytology CSF; Cerebrospinal Fluid  Result Value Ref Range Status   Specimen Description CSF  Final   Special Requests NONE  Final   Gram Stain   Final    WBC PRESENT, PREDOMINANTLY MONONUCLEAR NO ORGANISMS SEEN CYTOSPIN SMEAR Performed at [facility], [address], [place], [place] [address]    Culture   Final    NO ANAEROBES ISOLATED; CULTURE IN PROGRESS FOR 5 DAYS   Report Status PENDING  Incomplete  Fungus culture, blood     Status: None (Preliminary result)   Collection Time: 08/27/23  8:15 PM   Specimen: BLOOD LEFT HAND  Result Value Ref Range Status   Specimen Description BLOOD LEFT HAND  Final   Special Requests   Final    BOTTLES DRAWN AEROBIC AND ANAEROBIC Blood Culture adequate volume   Culture   Final    NO GROWTH 3 DAYS Performed at [facility], [address] [place], [place] [address]    Report Status PENDING  Incomplete    Rexene Alberts, MSN, NP-C [facility]  [email or web address] Pager: [phone] Office: [phone] [facility] Main Line: [phone] *Secure Chat Communication Welcome

## 2023-08-31 ENCOUNTER — Encounter (HOSPITAL_COMMUNITY): Payer: Self-pay

## 2023-08-31 DIAGNOSIS — B459 Cryptococcosis, unspecified: Secondary | ICD-10-CM | POA: Diagnosis not present

## 2023-08-31 LAB — GLUCOSE, CAPILLARY
Glucose-Capillary: 151 mg/dL — ABNORMAL HIGH (ref 70–99)
Glucose-Capillary: 180 mg/dL — ABNORMAL HIGH (ref 70–99)
Glucose-Capillary: 153 mg/dL — ABNORMAL HIGH (ref 70–99)
Glucose-Capillary: 225 mg/dL — ABNORMAL HIGH (ref 70–99)

## 2023-08-31 LAB — BASIC METABOLIC PANEL
Anion gap: 11 (ref 5–15)
BUN: 26 mg/dL — ABNORMAL HIGH (ref 6–20)
CO2: 20 mmol/L — ABNORMAL LOW (ref 22–32)
Calcium: 8.4 mg/dL — ABNORMAL LOW (ref 8.9–10.3)
Chloride: 110 mmol/L (ref 98–111)
Creatinine, Ser: 2.28 mg/dL — ABNORMAL HIGH (ref 0.61–1.24)
GFR, Estimated: 35 mL/min — ABNORMAL LOW (ref >60)
Glucose, Bld: 106 mg/dL — ABNORMAL HIGH (ref 70–99)
Potassium: 4.3 mmol/L (ref 3.5–5.1)
Sodium: 141 mmol/L (ref 135–145)

## 2023-08-31 MED ORDER — DILTIAZEM HCL 60 MG PO TABS
60.0000 mg | ORAL_TABLET | Freq: Four times a day (QID) | ORAL | Status: DC
  Administered 2023-08-31 – 2023-09-02 (×9): 60 mg via ORAL
  Filled 2023-08-31 (×10): qty 1

## 2023-08-31 MED ORDER — BICTEGRAVIR-EMTRICITAB-TENOFOV 50-200-25 MG PO TABS
1.0000 | ORAL_TABLET | Freq: Every day | ORAL | Status: DC
  Administered 2023-08-31 – 2023-09-02 (×3): 1 via ORAL
  Filled 2023-08-31 (×3): qty 1

## 2023-08-31 NOTE — Plan of Care (Signed)
  Problem: Education: Goal: Knowledge of General Education information will improve Description: Including pain rating scale, medication(s)/side effects and non-pharmacologic comfort measures 08/31/2023 1730 by Mindi Junker, RN Outcome: Progressing 08/31/2023 1730 by Mindi Junker, RN Outcome: Progressing   Problem: Clinical Measurements: Goal: Ability to maintain clinical measurements within normal limits will improve 08/31/2023 1730 by Mindi Junker, RN Outcome: Progressing 08/31/2023 1730 by Mindi Junker, RN Outcome: Progressing   Problem: Clinical Measurements: Goal: Diagnostic test results will improve 08/31/2023 1730 by Mindi Junker, RN Outcome: Progressing 08/31/2023 1730 by Mindi Junker, RN Outcome: Progressing   Problem: Coping: Goal: Level of anxiety will decrease 08/31/2023 1730 by Mindi Junker, RN Outcome: Progressing 08/31/2023 1730 by Mindi Junker, RN Outcome: Progressing

## 2023-08-31 NOTE — Progress Notes (Signed)
 ID PROGRESS NOTE  48yo F with poorly controlled hiv disease, and T2DM, with seizure and found cryptococcus antigenemia, ruling out fungemia. No evidence of meningitis.  - plan to start biktarvy today for hiv disease  - continue on high dose fluconazole for cryptococcal disease  - still needs ivf bolus to help with AKI associated L-ampho that has now been discontinued but suspect will get back to baseline.  Duke Salvia Drue Second MD MPH [facility] [phone]

## 2023-08-31 NOTE — Progress Notes (Signed)
 Jeremiah Stevens  ZOX:[id number] DOB: 1976/05/29 DOA: 08/25/2023 PCP: Pcp, No    Brief Narrative:  48 year old with a history of HIV, obesity, and DM2 who does not routinely seek medical care and is noncompliant with his prescribed medication regimen who was noted to develop the acute onset of altered mental status 08/24/2022 associated with occasional mechanical falls.  After he suffered an episode of decreased responsiveness with clenched fist and convulsions at home he was brought to the ER for evaluation.  He was found to be in a hyperosmolar hyperglycemic state at presentation.  CT head was unrevealing.  CXR was unrevealing.  He was admitted to the ICU by PCCM.  Significant Events:  2/12 admitted with HHS 2/13 mental status improved -EEG unrevealing -Keppra initiated -ID consulted 2/14 cryptococcal antigen highly positive  2/15 MRI brain unrevealing - LP w/ negative CSF cryptococcal antigen 2/16 [facility] assumed care   Goals of Care:   Code Status: Full Code   DVT prophylaxis: SCDs Start: 08/25/23 2116   Interim Hx: Afebrile.  Vitals otherwise stable.  Creatinine further elevated today.  No complaints at this time.  Resting comfortably in bed.  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 - Amphotericin transitioned to high-dose Diflucan per ID team due to AKI - CT scan chest suggests pulmonary involvement  Acute kidney injury Creatinine consistently climbing in setting of ongoing Amphotericin use in absence of IV fluid support -continue to hydrate with new orders for IVF - hopefully creatinine will plateau soon and then improve -continue CCB  HIV with AIDS Noncompliant with medical therapy -CD4 43 -RPR negative  -Toxoplasma negative -care per ID - Bactrim for PJP and toxo prophylaxis -azithromycin for MAC prophylaxis -will need outpatient follow-up in ID clinic  Metabolic encephalopathy due to HHS Resolved  HTN No apparent prior diagnosis of this, but BP consistently elevated while hospitalized - cont tx and follow trend -avoid ACE/ARB for now  New onset seizures Felt to have been incited by HHS - was dosed with Keppra initially with Neurology subsequently feeling that it is not not needed long-term  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  Polysubstance abuse UDS positive for amphetamines and benzodiazepines -family reported patient abuses methamphetamine and cocaine   Family Communication: No family present at time of exam today Disposition: To be determined once medically cleared from ID standpoint   Objective: Blood pressure (!) 157/99, pulse 89, temperature 98.5 F (36.9 C), temperature source Oral, resp. rate 18, height 5\' 7"  (1.702 m), weight 119.2 kg, SpO2 99%.  Intake/Output Summary (Last 24 hours) at 08/31/2023 0923 Last data filed at 08/30/2023 1841 Gross per 24 hour  Intake 2161 ml  Output --  Net 2161 ml    Filed Weights   08/27/23 0446 08/29/23 0412 08/30/23 0456  Weight: 121.5 kg 119.3 kg 119.2 kg    Examination: General: No acute respiratory distress Lungs: Clear to auscultation bilaterally without wheezes or crackles Cardiovascular: Regular rate and rhythm  Abdomen: NT/ND, soft, BS positive, no rebound Extremities: No significant C/C/E B LE   CBC: Recent Labs  Lab 08/25/23 1739 08/26/23 0933 08/27/23 0226 08/30/23 0353  WBC 3.5* 2.6* 2.4* 2.3*  NEUTROABS 2.7  --   --   --  HGB 13.5 11.6* 12.4* 11.9*  HCT 39.4 33.3* 37.1* 35.1*  MCV 86.0 85.2 87.1 87.1  PLT 166 123* 132* 126*   Basic Metabolic Panel: Recent Labs  Lab 08/26/23 0933 08/26/23 1145 08/27/23 0226 08/28/23 0804 08/29/23 0317 08/30/23 0353  08/31/23 0414  NA 142   < > 140 133* 135 134* 141  K 3.7   < > 4.0 3.6 3.9 4.3 4.3  CL 110   < > 104 102 103 102 110  CO2 25   < > 24 23 22 22  20*  GLUCOSE 95   < > 98 263* 130* 292* 106*  BUN 14   < > 12 12 12  21* 26*  CREATININE 0.99   < > 1.01 1.35* 1.33* 1.93* 2.28*  CALCIUM 8.4*   < > 8.9 8.2* 8.6* 8.3* 8.4*  MG 2.0  --  1.9 2.2 2.0  --   --   PHOS 3.3  --   --  4.3  --   --   --    < > = values in this interval not displayed.   GFR: Estimated Creatinine Clearance (by C-G formula based on SCr of 2.28 mg/dL (H)) Male: 08.6 mL/min (A) Male: 49.5 mL/min (A)   Scheduled Meds:  azithromycin  1,200 mg Oral Weekly   Chlorhexidine Gluconate Cloth  6 each Topical Daily   diltiazem  30 mg Oral Q6H   enoxaparin (LOVENOX) injection  60 mg Subcutaneous Q24H   insulin aspart  0-15 Units Subcutaneous TID WC   insulin glargine-yfgn  12 Units Subcutaneous BID   lactulose  10 g Oral Daily   mouth rinse  15 mL Mouth Rinse 4 times per day   sulfamethoxazole-trimethoprim  1 tablet Oral Daily   Continuous Infusions:  sodium chloride 150 mL/hr at 08/31/23 0434   fluconazole (DIFLUCAN) IV 800 mg (08/31/23 0437)     LOS: 6 days   Lonia Blood, MD [facility] Office  [phone] Pager - Text Page per Loretha Stapler  If 7PM-7AM, please contact night-coverage per Amion 08/31/2023, 9:23 AM

## 2023-08-31 NOTE — Discharge Instructions (Signed)
 Low Income Public Housing  [facility] [employer] [facility] [phone] [place] [facility] [phone]  [facility] [address] [place] [phone]  [facility] [address] [place] [phone]  [facility] [address] [place] [phone]  [place] [address] [place] [phone]  [place] [address]  [address], [place] [phone]  [employer] [address], [place] [phone]  [facility] [address] [place] [phone]  [facility] [address] [place] [phone]  [facility] [address]  [facility] [phone]  [facility]  [address] [place] [phone]  [place] [address]  [address] [place] [phone]   [place] [facility] [place] [phone]  [place] [facility] [address] [place] [phone] [address], [place] [phone]  [facility] [address], [place] [phone]  [place] [address] [place] [phone]  [facility] [place] [phone]  [employer] [facility]  [address], [place] [phone]  [facility] [place] [phone] [address]pts[address]  [address] [phone]  [facility] [place] [phone]  [place] [address]  [address] [facility] [phone]

## 2023-08-31 NOTE — Plan of Care (Signed)

## 2023-09-01 ENCOUNTER — Other Ambulatory Visit (HOSPITAL_COMMUNITY): Payer: Self-pay

## 2023-09-01 DIAGNOSIS — B459 Cryptococcosis, unspecified: Secondary | ICD-10-CM | POA: Diagnosis not present

## 2023-09-01 DIAGNOSIS — E1100 Type 2 diabetes mellitus with hyperosmolarity without nonketotic hyperglycemic-hyperosmolar coma (NKHHC): Secondary | ICD-10-CM | POA: Diagnosis not present

## 2023-09-01 DIAGNOSIS — R569 Unspecified convulsions: Secondary | ICD-10-CM | POA: Diagnosis not present

## 2023-09-01 DIAGNOSIS — B20 Human immunodeficiency virus [HIV] disease: Secondary | ICD-10-CM | POA: Diagnosis not present

## 2023-09-01 LAB — BASIC METABOLIC PANEL
Anion gap: 8 (ref 5–15)
BUN: 21 mg/dL — ABNORMAL HIGH (ref 6–20)
CO2: 19 mmol/L — ABNORMAL LOW (ref 22–32)
Calcium: 8.5 mg/dL — ABNORMAL LOW (ref 8.9–10.3)
Chloride: 111 mmol/L (ref 98–111)
Creatinine, Ser: 2.26 mg/dL — ABNORMAL HIGH (ref 0.61–1.24)
GFR, Estimated: 35 mL/min — ABNORMAL LOW (ref >60)
Glucose, Bld: 194 mg/dL — ABNORMAL HIGH (ref 70–99)
Potassium: 4 mmol/L (ref 3.5–5.1)
Sodium: 138 mmol/L (ref 135–145)

## 2023-09-01 LAB — GLUCOSE, CAPILLARY
Glucose-Capillary: 143 mg/dL — ABNORMAL HIGH (ref 70–99)
Glucose-Capillary: 141 mg/dL — ABNORMAL HIGH (ref 70–99)
Glucose-Capillary: 201 mg/dL — ABNORMAL HIGH (ref 70–99)
Glucose-Capillary: 228 mg/dL — ABNORMAL HIGH (ref 70–99)

## 2023-09-01 LAB — ANAEROBIC CULTURE W GRAM STAIN

## 2023-09-01 MED ORDER — FLUCONAZOLE 200 MG PO TABS
400.0000 mg | ORAL_TABLET | Freq: Every day | ORAL | Status: DC
Start: 2023-09-10 — End: 2023-09-02

## 2023-09-01 MED ORDER — FLUCONAZOLE 200 MG PO TABS
ORAL_TABLET | ORAL | 0 refills | Status: DC
  Filled 2023-09-01: qty 74, 30d supply, fill #0

## 2023-09-01 MED ORDER — AZITHROMYCIN 600 MG PO TABS
1200.0000 mg | ORAL_TABLET | ORAL | 0 refills | Status: DC
  Filled 2023-09-01: qty 8, 28d supply, fill #0

## 2023-09-01 MED ORDER — BICTEGRAVIR-EMTRICITAB-TENOFOV 50-200-25 MG PO TABS
1.0000 | ORAL_TABLET | Freq: Every day | ORAL | 2 refills | Status: DC
  Filled 2023-09-01: qty 30, 30d supply, fill #0

## 2023-09-01 MED ORDER — FLUCONAZOLE 200 MG PO TABS: 400.0000 mg | ORAL_TABLET | Freq: Every day | ORAL | Status: DC

## 2023-09-01 MED ORDER — FLUCONAZOLE IN SODIUM CHLORIDE 400-0.9 MG/200ML-% IV SOLN: 400.0000 mg | INTRAVENOUS | Status: DC

## 2023-09-01 MED ORDER — FLUCONAZOLE 200 MG PO TABS
800.0000 mg | ORAL_TABLET | Freq: Every day | ORAL | Status: DC
  Administered 2023-09-02: 800 mg via ORAL
  Filled 2023-09-01: qty 4

## 2023-09-01 MED ORDER — SULFAMETHOXAZOLE-TRIMETHOPRIM 800-160 MG PO TABS
1.0000 | ORAL_TABLET | Freq: Every day | ORAL | 2 refills | Status: DC
  Filled 2023-09-01: qty 30, 30d supply, fill #0

## 2023-09-01 NOTE — Progress Notes (Addendum)
 [facility]  Date of Admission:  08/25/2023      Total days of antibiotics 7   Fluconazole 2/12 > c  Amphotericin 2/13 >>  Flucytosine 2/13 >> 2/15         ASSESSMENT: Jeremiah Stevens is a 48 y.o. adult transfemale here with cryptococcal infection in the setting of advanced HIV / AIDS currently not on treatment.    Cryptococcal Infection -  Titer 1:640 --> 1:80 after 24h -  ME panel and CSF antigen negative but high serum titer. Fungal cultures from CSF so far without growth. AKI on amphotericin d/t IV boluses being stopped 48h ago. Will transition to high dose fluconazole and follow clinically. With isolated cryptococcal antigenemia based on HIV guidelines will finish out induction period of 2 weeks with 800 mg fluconazole PO daily (inpatient IV antifungals count for these days, making today day 7 for her). THEN 400 mg daily x 10 weeks THEN 200 mg daily at least 6 months or when CD4 recovers.  Treating Isolated Asymptomatic Cryptococcal Antigenemia (Serum CrAg Titer of LFA <1:640 [or <1:160 by EIA or Latex Agglutination]) Fluconazole 800-1,200 mg PO daily for 2 weeks, followed by fluconazole 400-800 mg PO daily for 10 weeks, then fluconazole 200 mg PO daily for a total of 6 months plus effective ART (BIII ) -Continue Biktarvy daily  -Bactrim 1 DS tab 3x weekly (daily OK if that helps adherence)  -Fluconazole 800 mg daily  -Azithromycin 1200 mg weekly  -FU arranged with Dr. Drue Second 3/13.   AKI -  Baseline Cr 1.04 -  Seems that the creatinine has plateau @ 2.26.   HIV / AIDS, CD4 34 -  She is not opposed to resuming her ART outpatient. Main barrier was transportation to the pharmacy. With Medicaid, we could plan to mail them to her monthly. She will consider.  With ruling out CNS involvement will plan to resume Biktarvy once a day for treatment. Continue bactrim for PJP prophylaxis. RPR nonreactive.   Medication Monitoring -  QTc 428 - will  repeat another EKG before discharge.  -LFTs prior to D/C   Housing Instability -  Will need [facility] to [facility] -  Medicaid has been re-activated. Would like to see if we can use [facility] to mail to her (lives in [place] at the moment).   CAD -  Coronary calcifications seen on scan - defer statin until lower dose fluconazole to avoid side effects. Would benefit from statin per REPRIEVE trial data.   Diabetes -  A1C 11%+  She does not have primary care and will need help arranging care for DM management after hospital stay.    PLAN: Continue   Principal Problem:   Cryptococcosis (HCC) Active Problems:   Hyperosmolar hyperglycemic state (HHS) (HCC)   Seizure (HCC)   Acute respiratory failure with hypoxia (HCC)   Acute encephalopathy   Hyperammonemia (HCC)   Pancytopenia (HCC)    azithromycin  1,200 mg Oral Weekly   bictegravir-emtricitabine-tenofovir AF  1 tablet Oral Daily   Chlorhexidine Gluconate Cloth  6 each Topical Daily   diltiazem  60 mg Oral Q6H   enoxaparin (LOVENOX) injection  60 mg Subcutaneous Q24H   insulin aspart  0-15 Units Subcutaneous TID WC   insulin glargine-yfgn  12 Units Subcutaneous BID   lactulose  10 g Oral Daily   mouth rinse  15 mL Mouth Rinse 4 times per day   sulfamethoxazole-trimethoprim  1 tablet Oral Daily    SUBJECTIVE: Doing well today. Discussing housing challenges and dental needs.    Review of Systems: Review of Systems  Constitutional:  Negative for fever, malaise/fatigue and weight loss.  Respiratory:  Negative for cough.   Cardiovascular:  Negative for chest pain.  Gastrointestinal:  Negative for abdominal pain and vomiting.  Musculoskeletal:  Negative for back pain and neck pain.  Skin:  Negative for rash.     No Known Allergies  OBJECTIVE: Vitals:   08/31/23 0757 08/31/23 1638 08/31/23 2133 09/01/23 0348  BP: (!) 157/99 (!) 161/88 (!) 147/92 (!) 153/87  Pulse: 89 85 86 96  Resp: 18 18 18 18   Temp:  98.5 F (36.9 C) 97.8 F (36.6 C) 98.8 F (37.1 C) 98.6 F (37 C)  TempSrc: Oral  Oral Oral  SpO2: 99% 100% 100% 100%  Weight: 120.8 kg     Height:       Body mass index is 41.71 kg/m.  Physical Exam Vitals reviewed.  Constitutional:      Appearance: Normal appearance. She is not ill-appearing.  HENT:     Head: Normocephalic.     Mouth/Throat:     Mouth: Mucous membranes are moist.     Pharynx: Oropharynx is clear.  Eyes:     General: No scleral icterus. Cardiovascular:     Rate and Rhythm: Normal rate and regular rhythm.  Pulmonary:     Effort: Pulmonary effort is normal.  Musculoskeletal:        General: Normal range of motion.     Cervical back: Normal range of motion.  Skin:    Coloration: Skin is not jaundiced or pale.  Neurological:     Mental Status: She is alert and oriented to person, place, and time.  Psychiatric:        Mood and Affect: Mood normal.        Judgment: Judgment normal.     Lab Results Lab Results  Component Value Date   WBC 2.3 (L) 08/30/2023   HGB 11.9 (L) 08/30/2023   HCT 35.1 (L) 08/30/2023   MCV 87.1 08/30/2023   PLT 126 (L) 08/30/2023    Lab Results  Component Value Date   CREATININE 2.26 (H) 09/01/2023   BUN 21 (H) 09/01/2023   NA 138 09/01/2023   K 4.0 09/01/2023   CL 111 09/01/2023   CO2 19 (L) 09/01/2023    Lab Results  Component Value Date   ALT 36 08/30/2023   AST 39 08/30/2023   ALKPHOS 67 08/30/2023   BILITOT 0.6 08/30/2023     Microbiology: Recent Results (from the past 240 hours)  Resp panel by RT-PCR (RSV, Flu A&B, Covid) Anterior Nasal Swab     Status: None   Collection Time: 08/25/23  7:20 PM   Specimen: Anterior Nasal Swab  Result Value Ref Range Status   SARS Coronavirus 2 by RT PCR NEGATIVE NEGATIVE Final   Influenza A by PCR NEGATIVE NEGATIVE Final   Influenza B by PCR NEGATIVE NEGATIVE Final    Comment: (NOTE) The Xpert Xpress SARS-CoV-2/FLU/RSV plus assay is intended as an aid in the  diagnosis of influenza from Nasopharyngeal swab specimens and should not be used as a sole basis for treatment. Nasal washings and aspirates are unacceptable for Xpert Xpress SARS-CoV-2/FLU/RSV testing.  Fact Sheet for Patients: BloggerCourse.com  Fact Sheet for Healthcare Providers: SeriousBroker.it  This test is not yet approved or cleared by the [place] and has been authorized  for detection and/or diagnosis of SARS-CoV-2 by FDA under an Emergency Use Authorization (EUA). This EUA will remain in effect (meaning this test can be used) for the duration of the COVID-19 declaration under Section 564(b)(1) of the Act, 21 U.S.C. section 360bbb-3(b)(1), unless the authorization is terminated or revoked.     Resp Syncytial Virus by PCR NEGATIVE NEGATIVE Final    Comment: (NOTE) Fact Sheet for Patients: BloggerCourse.com  Fact Sheet for Healthcare Providers: SeriousBroker.it  This test is not yet approved or cleared by the [place] FDA and has been authorized for detection and/or diagnosis of SARS-CoV-2 by FDA under an Emergency Use Authorization (EUA). This EUA will remain in effect (meaning this test can be used) for the duration of the COVID-19 declaration under Section 564(b)(1) of the Act, 21 U.S.C. section 360bbb-3(b)(1), unless the authorization is terminated or revoked.  Performed at [facility], [address] [place], [place] [address]   MRSA Next Gen by PCR, Nasal     Status: None   Collection Time: 08/25/23 10:49 PM   Specimen: Nasal Mucosa; Nasal Swab  Result Value Ref Range Status   MRSA by PCR Next Gen NOT DETECTED NOT DETECTED Final    Comment: (NOTE) The GeneXpert MRSA Assay (FDA approved for NASAL specimens only), is one component of a comprehensive MRSA colonization surveillance program. It is not intended to diagnose MRSA infection nor to  guide or monitor treatment for MRSA infections. Test performance is not FDA approved in patients less than 2 years old. Performed at [facility], [address] [place], [place] [address]   CSF culture w Gram Stain     Status: None   Collection Time: 08/27/23 12:12 PM   Specimen: PATH Cytology CSF; Cerebrospinal Fluid  Result Value Ref Range Status   Specimen Description CSF  Final   Special Requests NONE  Final   Gram Stain   Final    WBC PRESENT, PREDOMINANTLY MONONUCLEAR NO ORGANISMS SEEN CYTOSPIN SMEAR    Culture   Final    NO GROWTH 3 DAYS Performed at [facility], [address], [place], [place] [address]    Report Status 08/30/2023 FINAL  Final  Culture, Fungus without Smear     Status: None (Preliminary result)   Collection Time: 08/27/23 12:12 PM   Specimen: PATH Cytology CSF; Cerebrospinal Fluid  Result Value Ref Range Status   Specimen Description CSF  Final   Special Requests NONE  Final   Culture   Final    NO GROWTH 4 DAYS Performed at [facility], [address] [place], [place] [address]    Report Status PENDING  Incomplete  Anaerobic culture w Gram Stain     Status: None (Preliminary result)   Collection Time: 08/27/23 12:12 PM   Specimen: PATH Cytology CSF; Cerebrospinal Fluid  Result Value Ref Range Status   Specimen Description CSF  Final   Special Requests NONE  Final   Gram Stain   Final    WBC PRESENT, PREDOMINANTLY MONONUCLEAR NO ORGANISMS SEEN CYTOSPIN SMEAR Performed at [facility], [address], [place], [place] [address]    Culture   Final    NO ANAEROBES ISOLATED; CULTURE IN PROGRESS FOR 5 DAYS   Report Status PENDING  Incomplete  Fungus culture, blood     Status: None (Preliminary result)   Collection Time: 08/27/23  8:15 PM   Specimen: BLOOD LEFT HAND  Result Value Ref Range Status  Specimen Description BLOOD LEFT HAND  Final   Special Requests   Final    BOTTLES DRAWN AEROBIC AND ANAEROBIC Blood  Culture adequate volume   Culture   Final    NO GROWTH 5 DAYS Performed at [facility], [address], [place], [place] [address]    Report Status PENDING  Incomplete    Rexene Alberts, MSN, NP-C [facility]  [email or web address] Pager: [phone] Office: [phone] [facility] Main Line: [phone] *Secure Chat Communication Welcome

## 2023-09-01 NOTE — Plan of Care (Signed)
  Problem: Education: Goal: Knowledge of General Education information will improve Description: Including pain rating scale, medication(s)/side effects and non-pharmacologic comfort measures Outcome: Progressing   Problem: Health Behavior/Discharge Planning: Goal: Ability to manage health-related needs will improve Outcome: Progressing   Problem: Coping: Goal: Level of anxiety will decrease Outcome: Progressing   Problem: Skin Integrity: Goal: Risk for impaired skin integrity will decrease Outcome: Progressing

## 2023-09-01 NOTE — Plan of Care (Signed)

## 2023-09-01 NOTE — Progress Notes (Signed)
 PROGRESS NOTE  Jeremiah Stevens ZOX:[id number] DOB: 1975-07-20 DOA: 08/25/2023 PCP: Pcp, No   LOS: 7 days   Brief narrative:  48 year old male with past medical history of HIV, obesity, and DM2 who does not routinely seek medical care and is noncompliant with his prescribed medication regimen to the hospital with acute onset of mental status changes and mechanical fall.  He was noted to have convulsions at home with altered consciousness.  In the ED he was noted to be in hyperosmolar hyperglycemic state.  CT head, chest x-ray was unrevealing.  Patient was initially admitted to ICU   Significant Events:  2/12 admitted with HHS 2/13 mental status improved -EEG unrevealing -Keppra initiated -ID consulted 2/14 cryptococcal antigen highly positive  2/15 MRI brain unrevealing - LP w/ negative CSF cryptococcal antigen 2/16 [facility] assumed care  2/19-elevated creatinine level at plateau.    Assessment/Plan: Principal Problem:   Cryptococcosis (HCC) Active Problems:   Hyperosmolar hyperglycemic state (HHS) (HCC)   Seizure (HCC)   Acute respiratory failure with hypoxia (HCC)   Acute encephalopathy   Hyperammonemia (HCC)   Pancytopenia (HCC)  Uncontrolled DM2 with hyperosmolar hyperglycemic state Last hemoglobin A1c 11.7 -noncompliant with medical therapy in the outpatient setting -continue current insulin regimen with sliding scale insulin and Semglee 12 units twice a day.  Latest POC glucose of 143.  Cryptococcal infection No evidence of meningitis from lumbar puncture.  Was initially on Amphotericin which has been changed to high-dose Diflucan due to AKI.  CT scan of the chest showed pulmonary infiltrates.     Acute kidney injury Likely Amphotericin use induced.  Latest creatinine of 2.2 and has plateaued..  Continue IV hydration.  Positive balance for 16, 186.  Continue strict intake and output charting   HIV with AIDS Noncompliant with medical therapy as outpatient..  Recent CD4 43  -RPR negative. Toxoplasma negative.  Continue Bactrim for PJP and toxo prophylaxis , azithromycin for MAC prophylaxis.  Patient will need outpatient follow-up in ID clinic.  Has been started on Biktarvy.   Metabolic encephalopathy due to HHS Resolved   HTN No previous history of heart has been persistently high.  Avoid ACE/ARB for now   New onset seizures To be secondary to hyperosmolar state.  Was on Keppra initially and neurology with an impression that patient might not need for long-term.  Off Keppra at this time.  Pancytopenia Due to HIV AIDS.  Will continue to monitor.  No evidence of bleeding.  Latest platelet count of 126 with WBC at 2.3.  Hemoglobin 11.9.  Class III obesity- Body mass index is 41.16 kg/m.  Would benefit from weight loss as outpatient.   Polysubstance abuse UDS positive for amphetamines and benzodiazepines.    DVT prophylaxis: SCDs Start: 08/25/23 2116   Disposition: Home when okay with ID, pending renal function improvement  Status is: Inpatient Remains inpatient appropriate because: Pending renal function improvement,    Code Status:     Code Status: Full Code  Family Communication: None at bedside  Consultants: Infectious disease PCCM  Procedures: EEG Lumbar puncture  Anti-infectives:  Biktarvy Fluconazole Azithromycin Bactrim  Anti-infectives (From admission, onward)    Start     Dose/Rate Route Frequency Ordered Stop   08/31/23 1100  bictegravir-emtricitabine-tenofovir AF (BIKTARVY) 50-200-25 MG per tablet 1 tablet        1 tablet Oral Daily 08/31/23 1003     08/31/23 0600  fluconazole (DIFLUCAN) IVPB 800 mg        800 mg  100 mL/hr over 240 Minutes Intravenous Every 24 hours 08/30/23 0948     08/28/23 1000  azithromycin (ZITHROMAX) tablet 1,200 mg        1,200 mg Oral Weekly 08/27/23 1513     08/28/23 0200  amphotericin B liposome (AMBISOME) 350 mg in dextrose 5 % 500 mL IVPB  Status:  Discontinued        350 mg 293.8 mL/hr  over 120 Minutes Intravenous Every 24 hours 08/27/23 1259 08/30/23 0948   08/27/23 0000  flucytosine (ANCOBON) capsule 2,250 mg  Status:  Discontinued        25 mg/kg  87.5 kg (Adjusted) Oral Every 6 hours 08/26/23 2102 08/26/23 2143   08/27/23 0000  flucytosine (ANCOBON) capsule 1,750 mg  Status:  Discontinued        25 mg/kg  66.1 kg (Ideal) Oral Every 6 hours 08/26/23 2143 08/26/23 2246   08/27/23 0000  flucytosine (ANCOBON) capsule 2,000 mg  Status:  Discontinued        2,000 mg Oral Every 6 hours 08/26/23 2246 08/27/23 1837   08/26/23 2300  amphotericin B liposome (AMBISOME) 260 mg in dextrose 5 % 500 mL IVPB  Status:  Discontinued        3 mg/kg  87.5 kg (Adjusted) 282.5 mL/hr over 120 Minutes Intravenous Every 24 hours 08/26/23 2102 08/26/23 2146   08/26/23 2300  amphotericin B liposome (AMBISOME) 300 mg in dextrose 5 % 500 mL IVPB  Status:  Discontinued        300 mg 287.5 mL/hr over 120 Minutes Intravenous Every 24 hours 08/26/23 2146 08/27/23 1259   08/26/23 1445  bictegravir-emtricitabine-tenofovir AF (BIKTARVY) 50-200-25 MG per tablet 1 tablet  Status:  Discontinued        1 tablet Oral Daily 08/26/23 1349 08/27/23 0823   08/26/23 1445  sulfamethoxazole-trimethoprim (BACTRIM DS) 800-160 MG per tablet 1 tablet        1 tablet Oral Daily 08/26/23 1349          Subjective: Today, patient was seen and examined at bedside.  Patient denies any dizziness nausea vomiting fever chills or rigor.  Denies any headache.  Complains of occasional spots with vision.  Objective: Vitals:   08/31/23 2133 09/01/23 0348  BP: (!) 147/92 (!) 153/87  Pulse: 86 96  Resp: 18 18  Temp: 98.8 F (37.1 C) 98.6 F (37 C)  SpO2: 100% 100%    Intake/Output Summary (Last 24 hours) at 09/01/2023 1006 Last data filed at 09/01/2023 0900 Gross per 24 hour  Intake 6801.06 ml  Output --  Net 6801.06 ml   Filed Weights   08/29/23 0412 08/30/23 0456 08/31/23 0757  Weight: 119.3 kg 119.2 kg 120.8 kg    Body mass index is 41.71 kg/m.   Physical Exam: GENERAL: Patient is alert awake and oriented. Not in obvious distress.  Obese build. HENT: No scleral pallor or icterus. Pupils equally reactive to light. Oral mucosa is moist NECK: is supple, no gross swelling noted. CHEST: Clear to auscultation. No crackles or wheezes.  Diminished breath sounds bilaterally. CVS: S1 and S2 heard, no murmur. Regular rate and rhythm.  ABDOMEN: Soft, non-tender, bowel sounds are present. EXTREMITIES: No edema. CNS: Cranial nerves are intact. No focal motor deficits. SKIN: warm and dry without rashes.  Data Review: I have personally reviewed the following laboratory data and studies,  CBC: Recent Labs  Lab 08/25/23 1739 08/26/23 0933 08/27/23 0226 08/30/23 0353  WBC 3.5* 2.6* 2.4* 2.3*  NEUTROABS 2.7  --   --   --  HGB 13.5 11.6* 12.4* 11.9*  HCT 39.4 33.3* 37.1* 35.1*  MCV 86.0 85.2 87.1 87.1  PLT 166 123* 132* 126*   Basic Metabolic Panel: Recent Labs  Lab 08/26/23 0933 08/26/23 1145 08/27/23 0226 08/28/23 0804 08/29/23 0317 08/30/23 0353 08/31/23 0414 09/01/23 0557  NA 142   < > 140 133* 135 134* 141 138  K 3.7   < > 4.0 3.6 3.9 4.3 4.3 4.0  CL 110   < > 104 102 103 102 110 111  CO2 25   < > 24 23 22 22  20* 19*  GLUCOSE 95   < > 98 263* 130* 292* 106* 194*  BUN 14   < > 12 12 12  21* 26* 21*  CREATININE 0.99   < > 1.01 1.35* 1.33* 1.93* 2.28* 2.26*  CALCIUM 8.4*   < > 8.9 8.2* 8.6* 8.3* 8.4* 8.5*  MG 2.0  --  1.9 2.2 2.0  --   --   --   PHOS 3.3  --   --  4.3  --   --   --   --    < > = values in this interval not displayed.   Liver Function Tests: Recent Labs  Lab 08/25/23 1739 08/30/23 0353  AST 31 39  ALT 26 36  ALKPHOS 91 67  BILITOT 0.6 0.6  PROT 9.1* 7.8  ALBUMIN 3.1* 2.7*   No results for input(s): "LIPASE", "AMYLASE" in the last 168 hours. Recent Labs  Lab 08/26/23 0933  AMMONIA 51*   Cardiac Enzymes: No results for input(s): "CKTOTAL", "CKMB",  "CKMBINDEX", "TROPONINI" in the last 168 hours. BNP (last 3 results) No results for input(s): "BNP" in the last 8760 hours.  ProBNP (last 3 results) No results for input(s): "PROBNP" in the last 8760 hours.  CBG: Recent Labs  Lab 08/31/23 0758 08/31/23 1133 08/31/23 1641 08/31/23 2135 09/01/23 0828  GLUCAP 151* 180* 153* 225* 143*   Recent Results (from the past 240 hours)  Resp panel by RT-PCR (RSV, Flu A&B, Covid) Anterior Nasal Swab     Status: None   Collection Time: 08/25/23  7:20 PM   Specimen: Anterior Nasal Swab  Result Value Ref Range Status   SARS Coronavirus 2 by RT PCR NEGATIVE NEGATIVE Final   Influenza A by PCR NEGATIVE NEGATIVE Final   Influenza B by PCR NEGATIVE NEGATIVE Final    Comment: (NOTE) The Xpert Xpress SARS-CoV-2/FLU/RSV plus assay is intended as an aid in the diagnosis of influenza from Nasopharyngeal swab specimens and should not be used as a sole basis for treatment. Nasal washings and aspirates are unacceptable for Xpert Xpress SARS-CoV-2/FLU/RSV testing.  Fact Sheet for Patients: BloggerCourse.com  Fact Sheet for Healthcare Providers: SeriousBroker.it  This test is not yet approved or cleared by the [place] FDA and has been authorized for detection and/or diagnosis of SARS-CoV-2 by FDA under an Emergency Use Authorization (EUA). This EUA will remain in effect (meaning this test can be used) for the duration of the COVID-19 declaration under Section 564(b)(1) of the Act, 21 U.S.C. section 360bbb-3(b)(1), unless the authorization is terminated or revoked.     Resp Syncytial Virus by PCR NEGATIVE NEGATIVE Final    Comment: (NOTE) Fact Sheet for Patients: BloggerCourse.com  Fact Sheet for Healthcare Providers: SeriousBroker.it  This test is not yet approved or cleared by the [place] FDA and has been authorized for detection  and/or diagnosis of SARS-CoV-2 by FDA under an Emergency Use Authorization (EUA). This EUA  will remain in effect (meaning this test can be used) for the duration of the COVID-19 declaration under Section 564(b)(1) of the Act, 21 U.S.C. section 360bbb-3(b)(1), unless the authorization is terminated or revoked.  Performed at [facility], [address], [place], [place] [address]   MRSA Next Gen by PCR, Nasal     Status: None   Collection Time: 08/25/23 10:49 PM   Specimen: Nasal Mucosa; Nasal Swab  Result Value Ref Range Status   MRSA by PCR Next Gen NOT DETECTED NOT DETECTED Final    Comment: (NOTE) The GeneXpert MRSA Assay (FDA approved for NASAL specimens only), is one component of a comprehensive MRSA colonization surveillance program. It is not intended to diagnose MRSA infection nor to guide or monitor treatment for MRSA infections. Test performance is not FDA approved in patients less than 71 years old. Performed at [facility], [address] [place], [place] [address]   CSF culture w Gram Stain     Status: None   Collection Time: 08/27/23 12:12 PM   Specimen: PATH Cytology CSF; Cerebrospinal Fluid  Result Value Ref Range Status   Specimen Description CSF  Final   Special Requests NONE  Final   Gram Stain   Final    WBC PRESENT, PREDOMINANTLY MONONUCLEAR NO ORGANISMS SEEN CYTOSPIN SMEAR    Culture   Final    NO GROWTH 3 DAYS Performed at [facility], [address], [place], [place] [address]    Report Status 08/30/2023 FINAL  Final  Culture, Fungus without Smear     Status: None (Preliminary result)   Collection Time: 08/27/23 12:12 PM   Specimen: PATH Cytology CSF; Cerebrospinal Fluid  Result Value Ref Range Status   Specimen Description CSF  Final   Special Requests NONE  Final   Culture   Final    NO GROWTH 4 DAYS Performed at [facility], [address], [place], [place] [address]    Report Status PENDING  Incomplete   Anaerobic culture w Gram Stain     Status: None (Preliminary result)   Collection Time: 08/27/23 12:12 PM   Specimen: PATH Cytology CSF; Cerebrospinal Fluid  Result Value Ref Range Status   Specimen Description CSF  Final   Special Requests NONE  Final   Gram Stain   Final    WBC PRESENT, PREDOMINANTLY MONONUCLEAR NO ORGANISMS SEEN CYTOSPIN SMEAR Performed at [facility], [address] [place], [place] [address]    Culture   Final    NO ANAEROBES ISOLATED; CULTURE IN PROGRESS FOR 5 DAYS   Report Status PENDING  Incomplete  Fungus culture, blood     Status: None (Preliminary result)   Collection Time: 08/27/23  8:15 PM   Specimen: BLOOD LEFT HAND  Result Value Ref Range Status   Specimen Description BLOOD LEFT HAND  Final   Special Requests   Final    BOTTLES DRAWN AEROBIC AND ANAEROBIC Blood Culture adequate volume   Culture   Final    NO GROWTH 5 DAYS Performed at [facility], [address], [place], [place] [address]    Report Status PENDING  Incomplete     Studies: No results found.    Joycelyn Das, MD  [facility] 09/01/2023  If 7PM-7AM, please contact night-coverage

## 2023-09-02 ENCOUNTER — Other Ambulatory Visit (HOSPITAL_COMMUNITY): Payer: Self-pay

## 2023-09-02 DIAGNOSIS — B20 Human immunodeficiency virus [HIV] disease: Secondary | ICD-10-CM | POA: Diagnosis not present

## 2023-09-02 DIAGNOSIS — R569 Unspecified convulsions: Secondary | ICD-10-CM | POA: Diagnosis not present

## 2023-09-02 DIAGNOSIS — B459 Cryptococcosis, unspecified: Secondary | ICD-10-CM | POA: Diagnosis not present

## 2023-09-02 DIAGNOSIS — E1100 Type 2 diabetes mellitus with hyperosmolarity without nonketotic hyperglycemic-hyperosmolar coma (NKHHC): Secondary | ICD-10-CM | POA: Diagnosis not present

## 2023-09-02 LAB — BASIC METABOLIC PANEL
Anion gap: 8 (ref 5–15)
BUN: 20 mg/dL (ref 6–20)
CO2: 20 mmol/L — ABNORMAL LOW (ref 22–32)
Calcium: 8.6 mg/dL — ABNORMAL LOW (ref 8.9–10.3)
Chloride: 115 mmol/L — ABNORMAL HIGH (ref 98–111)
Creatinine, Ser: 2.02 mg/dL — ABNORMAL HIGH (ref 0.61–1.24)
GFR, Estimated: 40 mL/min — ABNORMAL LOW (ref >60)
Glucose, Bld: 108 mg/dL — ABNORMAL HIGH (ref 70–99)
Potassium: 4.2 mmol/L (ref 3.5–5.1)
Sodium: 143 mmol/L (ref 135–145)

## 2023-09-02 LAB — GLUCOSE, CAPILLARY
Glucose-Capillary: 129 mg/dL — ABNORMAL HIGH (ref 70–99)
Glucose-Capillary: 169 mg/dL — ABNORMAL HIGH (ref 70–99)
Glucose-Capillary: 114 mg/dL — ABNORMAL HIGH (ref 70–99)

## 2023-09-02 LAB — CBC
HCT: 31.2 % — ABNORMAL LOW (ref 39.0–52.0)
Hemoglobin: 10.6 g/dL — ABNORMAL LOW (ref 13.0–17.0)
MCH: 29.7 pg (ref 26.0–34.0)
MCHC: 34 g/dL (ref 30.0–36.0)
MCV: 87.4 fL (ref 80.0–100.0)
Platelets: 126 10*3/uL — ABNORMAL LOW (ref 150–400)
RBC: 3.57 MIL/uL — ABNORMAL LOW (ref 4.22–5.81)
RDW: 13.8 % (ref 11.5–15.5)
WBC: 2.9 10*3/uL — ABNORMAL LOW (ref 4.0–10.5)
nRBC: 0 % (ref 0.0–0.2)

## 2023-09-02 LAB — MAGNESIUM: Magnesium: 1.4 mg/dL — ABNORMAL LOW (ref 1.7–2.4)

## 2023-09-02 MED ORDER — DILTIAZEM HCL ER COATED BEADS 360 MG PO CP24
360.0000 mg | ORAL_CAPSULE | Freq: Every day | ORAL | 2 refills | Status: DC
  Filled 2023-09-02: qty 90, 90d supply, fill #0

## 2023-09-02 MED ORDER — LANCET DEVICE MISC
1.0000 | Freq: Three times a day (TID) | 0 refills | Status: DC
  Filled 2023-09-02: qty 1, fill #0

## 2023-09-02 MED ORDER — MAGNESIUM OXIDE -MG SUPPLEMENT 400 (240 MG) MG PO TABS
400.0000 mg | ORAL_TABLET | Freq: Two times a day (BID) | ORAL | Status: DC
  Administered 2023-09-02: 400 mg via ORAL
  Filled 2023-09-02: qty 1

## 2023-09-02 MED ORDER — MAGNESIUM SULFATE 2 GM/50ML IV SOLN
2.0000 g | Freq: Once | INTRAVENOUS | Status: AC
  Administered 2023-09-02: 2 g via INTRAVENOUS
  Filled 2023-09-02: qty 50

## 2023-09-02 MED ORDER — POLYETHYLENE GLYCOL 3350 17 GM/SCOOP PO POWD
17.0000 g | Freq: Every day | ORAL | 0 refills | Status: DC | PRN
  Filled 2023-09-02: qty 238, 14d supply, fill #0

## 2023-09-02 MED ORDER — BLOOD GLUCOSE TEST VI STRP
1.0000 | ORAL_STRIP | Freq: Three times a day (TID) | 0 refills | Status: DC
Start: 2023-09-02 — End: 2024-03-23
  Filled 2023-09-02: qty 100, 34d supply, fill #0

## 2023-09-02 MED ORDER — BLOOD GLUCOSE MONITOR SYSTEM W/DEVICE KIT
1.0000 | PACK | Freq: Three times a day (TID) | 0 refills | Status: DC
  Filled 2023-09-02: qty 1, 30d supply, fill #0

## 2023-09-02 MED ORDER — INSULIN PEN NEEDLE 32G X 4 MM MISC
1.0000 | Freq: Three times a day (TID) | 0 refills | Status: DC
  Filled 2023-09-02: qty 100, 34d supply, fill #0

## 2023-09-02 MED ORDER — ACCU-CHEK SOFTCLIX LANCETS MISC
1.0000 | Freq: Three times a day (TID) | 0 refills | Status: DC
  Filled 2023-09-02: qty 100, 34d supply, fill #0

## 2023-09-02 MED ORDER — INSULIN GLARGINE 100 UNIT/ML SOLOSTAR PEN
12.0000 [IU] | PEN_INJECTOR | Freq: Two times a day (BID) | SUBCUTANEOUS | 2 refills | Status: DC
  Filled 2023-09-02: qty 21, 88d supply, fill #0

## 2023-09-02 MED ORDER — MAGNESIUM OXIDE -MG SUPPLEMENT 400 (240 MG) MG PO TABS
400.0000 mg | ORAL_TABLET | Freq: Two times a day (BID) | ORAL | 0 refills | Status: AC
  Filled 2023-09-02: qty 20, 10d supply, fill #0

## 2023-09-02 NOTE — Plan of Care (Signed)

## 2023-09-02 NOTE — Discharge Summary (Signed)
 Physician Discharge Summary  Jeremiah Stevens ZOX:[id number] DOB: 08-23-1975 DOA: 08/25/2023  PCP: Pcp, No  Admit date: 08/25/2023 Discharge date: 09/02/2023  Admitted From: Home  Discharge disposition: Home  Recommendations for Outpatient Follow-Up:   Follow up with your primary care provider in one week.  Check CBC, BMP, magnesium in the next visit Follow-up with infectious disease in the clinic as arranged by the clinic.   Discharge Diagnosis:   Principal Problem:   Cryptococcosis (HCC) Active Problems:   Hyperosmolar hyperglycemic state (HHS) (HCC)   Seizure (HCC)   Acute respiratory failure with hypoxia (HCC)   Acute encephalopathy   Hyperammonemia (HCC)   Pancytopenia (HCC)   Discharge Condition: Improved.  Diet recommendation: Carbohydrate-modified.    Wound care: None.  Code status: Full.   History of Present Illness:   48 year old male with past medical history of HIV, obesity, and DM2 who does not routinely seek medical care and is noncompliant with his prescribed medication regimen to the hospital with acute onset of mental status changes and mechanical fall.  He was noted to have convulsions at home with altered consciousness.  In the ED he was noted to be in hyperosmolar hyperglycemic state.  CT head, chest x-ray was unrevealing.  Patient was initially admitted to ICU    Significant Events:  2/12 admitted with HHS 2/13 mental status improved -EEG unrevealing -Keppra initiated -ID consulted 2/14 cryptococcal antigen highly positive  2/15 MRI brain unrevealing - LP w/ negative CSF cryptococcal antigen 2/16 [facility] assumed care  2/19-elevated creatinine level at plateau, slightly downtrending.  Hospital Course:   Following conditions were addressed during hospitalization as listed below,  Uncontrolled DM2 with hyperosmolar hyperglycemic state Last hemoglobin A1c 11.7  Noncompliant with medical therapy in the outpatient setting. Patient will continue  on Lantus 12 units twice daily as outpatient..  Latest POC glucose of 143.   Cryptococcal antigenemia No evidence of meningitis from lumbar puncture.  Was initially on Amphotericin which has been changed to high-dose Diflucan due to AKI.  CT scan of the chest showed pulmonary infiltrates.     Acute kidney injury Likely Amphotericin use induced.  Latest creatinine of 2.0 from 2.2 and patient has been having good urinary output.  Discussed this with ID at this time and since improving consider for outpatient follow-up.  Encouraged outpatient hydration.   HIV with AIDS Noncompliant with medical therapy as outpatient..  Recent CD4 43. 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 Toxoplasma negative.  Continue Bactrim for PJP and toxo prophylaxis , azithromycin for MAC prophylaxis.  Patient will need outpatient follow-up in ID clinic.  Continue started on Biktarvy.   Metabolic encephalopathy due to HHS Resolved   HTN Patient will continue Cardizem CD on discharge.  Was on Cardizem short-acting while in the hospital.  Hypomagnesemia.  Received 2 g of magnesium sulfate IV prior to discharge.  Will continue magnesium oxide for few more days on discharge.   New onset seizures To be secondary to hyperosmolar state.  Was on Keppra initially and neurology with an impression that patient might not need for long-term.  No plans for antiepileptics on discharge.   Pancytopenia Due to HIV AIDS.  Will continue to monitor.  No evidence of bleeding.  Latest platelet count of 126 with WBC at 2.9.  Hemoglobin 10.6 on discharge.  Recommend outpatient follow-up.  Class III obesity- Body mass index is 41.16 kg/m.  Would benefit from weight loss as outpatient.   Polysubstance abuse UDS positive for amphetamines and benzodiazepines.  Counseling done.  Disposition.  At this time, patient is stable for disposition home with outpatient PCP and ID follow-up.  Medical Consultants:   Infectious disease Neurology  Procedures:    PICC line  placement EEG Lumbar puncture  Subjective:   Today, patient was seen and examined at bedside.  Feels okay.  Denies any nausea, vomiting, fever, chills or rigor.  No shortness of breath or chest pain.  Discharge Exam:   Vitals:   09/02/23 0803 09/02/23 1140  BP: 124/81 124/81  Pulse: 93   Resp:    Temp: 98.4 F (36.9 C)   SpO2: 98%    Vitals:   09/02/23 0500 09/02/23 0550 09/02/23 0803 09/02/23 1140  BP:  (!) 158/102 124/81 124/81  Pulse:  95 93   Resp:  18    Temp:  98.5 F (36.9 C) 98.4 F (36.9 C)   TempSrc:  Oral Oral   SpO2:  100% 98%   Weight: 120 kg     Height:       Body mass index is 41.43 kg/m.  General: Alert awake, not in obvious distress, obese built HENT: pupils equally reacting to light,  No scleral pallor or icterus noted. Oral mucosa is moist.  Chest:    Clear breath sounds bilaterally.Marland Kitchen No crackles or wheezes.  CVS: S1 &S2 heard. No murmur.  Regular rate and rhythm. Abdomen: Soft, nontender, nondistended.  Bowel sounds are heard.   Extremities: No cyanosis, clubbing or edema.  Peripheral pulses are palpable. Psych: Alert, awake and oriented, normal mood CNS:  No cranial nerve deficits.  Power equal in all extremities.   Skin: Warm and dry.  No rashes noted.  The results of significant diagnostics from this hospitalization (including imaging, microbiology, ancillary and laboratory) are listed below for reference.     Diagnostic Studies:   EEG adult Result Date: 08/26/2023 Charlsie Quest, MD     08/26/2023 10:22 AM Patient Name: Jeremiah Stevens MRN: [id number] Epilepsy Attending: Charlsie Quest Referring Physician/Provider: Rejeana Brock, MD Date: 08/26/2023 Duration: 08/26/2023 Patient history: 48 y.o. male who presents with seizures in the setting of severe hyperglycemia. Level of alertness: Awake, asleep AEDs during EEG study: LEV Technical aspects: This EEG study was done with scalp electrodes positioned according to the 10-20  International system of electrode placement. Electrical activity was reviewed with band pass filter of 1-70Hz , sensitivity of 7 uV/mm, display speed of 44mm/sec with a 60Hz  notched filter applied as appropriate. EEG data were recorded continuously and digitally stored.  Video monitoring was available and reviewed as appropriate. Description: The posterior dominant rhythm consists of 8 Hz activity of moderate voltage (25-35 uV) seen predominantly in posterior head regions, symmetric and reactive to eye opening and eye closing. Sleep was characterized by vertex waves, sleep spindles (12 to 14 Hz), maximal frontocentral region. Hyperventilation and photic stimulation were not performed. IMPRESSION: This study is within normal limits. No seizures or epileptiform discharges were seen throughout the recording. A normal interictal EEG does not exclude the diagnosis of epilepsy.  Charlsie Quest   CT Head Wo Contrast Result Date: 08/25/2023 CLINICAL DATA:  ams seizure EXAM: CT HEAD WITHOUT CONTRAST TECHNIQUE: Contiguous axial images were obtained from the base of the skull through the vertex without intravenous contrast. RADIATION DOSE REDUCTION: This exam was performed according to the departmental dose-optimization program which includes automated exposure control, adjustment of the mA and/or kV according to patient size and/or use of iterative reconstruction technique. COMPARISON:  None Available. FINDINGS: Brain:  No evidence of acute infarction, hemorrhage, hydrocephalus, extra-axial collection or mass lesion/mass effect. Vascular: No hyperdense vessel. Skull: No acute fracture. Sinuses/Orbits: Mostly clear sinuses.  No acute orbital findings. Other: No mastoid effusions. IMPRESSION: No evidence of acute intracranial abnormality. Electronically Signed   By: Feliberto Harts M.D.   On: 08/25/2023 20:41   DG Chest Portable 1 View Result Date: 08/25/2023 CLINICAL DATA:  Hypoxia EXAM: PORTABLE CHEST 1 VIEW  COMPARISON:  None Available. FINDINGS: The right inferior costophrenic angle is clipped off the edge of the film. Underinflation. Enlarged cardiopericardial silhouette. Mild perihilar opacities could relate to level of inflation and technique. Bronchovascular crowding. No separate consolidation, pneumothorax or effusion. IMPRESSION: Underinflation with enlarged heart and presumed bronchovascular crowding. Follow up study with improved inflation may be of some benefit however if there is further concern to better evaluate lung fields. The right inferior costophrenic angle is clipped off the edge of the film. Electronically Signed   By: Karen Kays M.D.   On: 08/25/2023 19:38     Labs:   Basic Metabolic Panel: Recent Labs  Lab 08/27/23 0226 08/28/23 0804 08/29/23 0317 08/30/23 0353 08/31/23 0414 09/01/23 0557 09/02/23 0300  NA 140 133* 135 134* 141 138 143  K 4.0 3.6 3.9 4.3 4.3 4.0 4.2  CL 104 102 103 102 110 111 115*  CO2 24 23 22 22  20* 19* 20*  GLUCOSE 98 263* 130* 292* 106* 194* 108*  BUN 12 12 12  21* 26* 21* 20  CREATININE 1.01 1.35* 1.33* 1.93* 2.28* 2.26* 2.02*  CALCIUM 8.9 8.2* 8.6* 8.3* 8.4* 8.5* 8.6*  MG 1.9 2.2 2.0  --   --   --  1.4*  PHOS  --  4.3  --   --   --   --   --    GFR Estimated Creatinine Clearance (by C-G formula based on SCr of 2.02 mg/dL (H)) Male: 41.3 mL/min (A) Male: 56.1 mL/min (A) Liver Function Tests: Recent Labs  Lab 08/30/23 0353  AST 39  ALT 36  ALKPHOS 67  BILITOT 0.6  PROT 7.8  ALBUMIN 2.7*   No results for input(s): "LIPASE", "AMYLASE" in the last 168 hours. No results for input(s): "AMMONIA" in the last 168 hours. Coagulation profile No results for input(s): "INR", "PROTIME" in the last 168 hours.  CBC: Recent Labs  Lab 08/27/23 0226 08/30/23 0353 09/02/23 0300  WBC 2.4* 2.3* 2.9*  HGB 12.4* 11.9* 10.6*  HCT 37.1* 35.1* 31.2*  MCV 87.1 87.1 87.4  PLT 132* 126* 126*   Cardiac Enzymes: No results for input(s):  "CKTOTAL", "CKMB", "CKMBINDEX", "TROPONINI" in the last 168 hours. BNP: Invalid input(s): "POCBNP" CBG: Recent Labs  Lab 09/01/23 0828 09/01/23 1220 09/01/23 1608 09/01/23 1926 09/02/23 0756  GLUCAP 143* 141* 201* 228* 129*   D-Dimer No results for input(s): "DDIMER" in the last 72 hours. Hgb A1c No results for input(s): "HGBA1C" in the last 72 hours. Lipid Profile No results for input(s): "CHOL", "HDL", "LDLCALC", "TRIG", "CHOLHDL", "LDLDIRECT" in the last 72 hours. Thyroid function studies No results for input(s): "TSH", "T4TOTAL", "T3FREE", "THYROIDAB" in the last 72 hours.  Invalid input(s): "FREET3" Anemia work up No results for input(s): "VITAMINB12", "FOLATE", "FERRITIN", "TIBC", "IRON", "RETICCTPCT" in the last 72 hours. Microbiology Recent Results (from the past 240 hours)  Resp panel by RT-PCR (RSV, Flu A&B, Covid) Anterior Nasal Swab     Status: None   Collection Time: 08/25/23  7:20 PM   Specimen: Anterior Nasal Swab  Result  Value Ref Range Status   SARS Coronavirus 2 by RT PCR NEGATIVE NEGATIVE Final   Influenza A by PCR NEGATIVE NEGATIVE Final   Influenza B by PCR NEGATIVE NEGATIVE Final    Comment: (NOTE) The Xpert Xpress SARS-CoV-2/FLU/RSV plus assay is intended as an aid in the diagnosis of influenza from Nasopharyngeal swab specimens and should not be used as a sole basis for treatment. Nasal washings and aspirates are unacceptable for Xpert Xpress SARS-CoV-2/FLU/RSV testing.  Fact Sheet for Patients: BloggerCourse.com  Fact Sheet for Healthcare Providers: SeriousBroker.it  This test is not yet approved or cleared by the [place] FDA and has been authorized for detection and/or diagnosis of SARS-CoV-2 by FDA under an Emergency Use Authorization (EUA). This EUA will remain in effect (meaning this test can be used) for the duration of the COVID-19 declaration under Section 564(b)(1) of the Act, 21  U.S.C. section 360bbb-3(b)(1), unless the authorization is terminated or revoked.     Resp Syncytial Virus by PCR NEGATIVE NEGATIVE Final    Comment: (NOTE) Fact Sheet for Patients: BloggerCourse.com  Fact Sheet for Healthcare Providers: SeriousBroker.it  This test is not yet approved or cleared by the [place] FDA and has been authorized for detection and/or diagnosis of SARS-CoV-2 by FDA under an Emergency Use Authorization (EUA). This EUA will remain in effect (meaning this test can be used) for the duration of the COVID-19 declaration under Section 564(b)(1) of the Act, 21 U.S.C. section 360bbb-3(b)(1), unless the authorization is terminated or revoked.  Performed at [facility], [address] [place], [place] [address]   MRSA Next Gen by PCR, Nasal     Status: None   Collection Time: 08/25/23 10:49 PM   Specimen: Nasal Mucosa; Nasal Swab  Result Value Ref Range Status   MRSA by PCR Next Gen NOT DETECTED NOT DETECTED Final    Comment: (NOTE) The GeneXpert MRSA Assay (FDA approved for NASAL specimens only), is one component of a comprehensive MRSA colonization surveillance program. It is not intended to diagnose MRSA infection nor to guide or monitor treatment for MRSA infections. Test performance is not FDA approved in patients less than 25 years old. Performed at [facility], [address] [place], [place] [address]   CSF culture w Gram Stain     Status: None   Collection Time: 08/27/23 12:12 PM   Specimen: PATH Cytology CSF; Cerebrospinal Fluid  Result Value Ref Range Status   Specimen Description CSF  Final   Special Requests NONE  Final   Gram Stain   Final    WBC PRESENT, PREDOMINANTLY MONONUCLEAR NO ORGANISMS SEEN CYTOSPIN SMEAR    Culture   Final    NO GROWTH 3 DAYS Performed at [facility], [address], [place], [place] [address]    Report Status 08/30/2023 FINAL  Final   Culture, Fungus without Smear     Status: None (Preliminary result)   Collection Time: 08/27/23 12:12 PM   Specimen: PATH Cytology CSF; Cerebrospinal Fluid  Result Value Ref Range Status   Specimen Description CSF  Final   Special Requests NONE  Final   Culture   Final    NO FUNGUS ISOLATED AFTER 6 DAYS Performed at [facility], [address] [place], [place] [address]    Report Status PENDING  Incomplete  Anaerobic culture w Gram Stain     Status: None   Collection Time: 08/27/23 12:12 PM   Specimen: PATH Cytology CSF;  Cerebrospinal Fluid  Result Value Ref Range Status   Specimen Description CSF  Final   Special Requests NONE  Final   Gram Stain   Final    WBC PRESENT, PREDOMINANTLY MONONUCLEAR NO ORGANISMS SEEN CYTOSPIN SMEAR    Culture   Final    NO ANAEROBES ISOLATED Performed at [facility], [address], [place], [place] [address]    Report Status 09/01/2023 FINAL  Final  Fungus culture, blood     Status: None (Preliminary result)   Collection Time: 08/27/23  8:15 PM   Specimen: BLOOD LEFT HAND  Result Value Ref Range Status   Specimen Description BLOOD LEFT HAND  Final   Special Requests   Final    BOTTLES DRAWN AEROBIC AND ANAEROBIC Blood Culture adequate volume   Culture   Final    NO GROWTH 6 DAYS Performed at [facility], [address] [place], [place] [address]    Report Status PENDING  Incomplete     Discharge Instructions:   Discharge Instructions     Call MD for:  persistant dizziness or light-headedness   Complete by: As directed    Call MD for:  severe uncontrolled pain   Complete by: As directed    Call MD for:  temperature >100.4   Complete by: As directed    Diet Carb Modified   Complete by: As directed    Discharge instructions   Complete by: As directed    Follow-up with your primary care provider in 1 week.  Check blood work at that time.  Follow-up with infectious disease clinic as scheduled by the clinic as  outpatient.  Take all the medications as prescribed including insulin and antibiotics.  Seek medical attention for worsening symptoms.   Increase activity slowly   Complete by: As directed    No wound care   Complete by: As directed       Allergies as of 09/02/2023   No Known Allergies      Medication List     TAKE these medications    Accu-Chek Guide Test test strip Generic drug: glucose blood Use as directed 3 (three) times daily to check blood sugar.   Accu-Chek Guide w/Device Kit Use as directed 3 (three) times daily.   Accu-Chek Softclix Lancets lancets Use as directed 3 (three) times daily to check blood sugar.   azithromycin 600 MG tablet Commonly known as: ZITHROMAX Take 2 tablets (1,200 mg total) by mouth every Saturday. Start taking on: September 04, 2023   BD Pen Needle Nano U/F 32G X 4 MM Misc Generic drug: Insulin Pen Needle Use as directed 3 (three) times daily.   Biktarvy 50-200-25 MG Tabs tablet Generic drug: bictegravir-emtricitabine-tenofovir AF Take 1 tablet by mouth daily.   diltiazem 360 MG 24 hr capsule Commonly known as: Cardizem CD Take 1 capsule (360 mg total) by mouth daily.   fluconazole 200 MG tablet Commonly known as: DIFLUCAN Take 4 tablets (800 mg total) by mouth daily for 7 days, THEN 2 tablets (400 mg total) daily for 23 days. Start taking on: September 02, 2023   Lancet Device Misc 1 each by Does not apply route 3 (three) times daily. May dispense any manufacturer covered by patient's insurance.   Lantus SoloStar 100 UNIT/ML Solostar Pen Generic drug: insulin glargine Inject 12 Units into the skin 2 (two) times daily.   magnesium oxide 400 (240 Mg) MG tablet Commonly known as: MAG-OX Take 1 tablet (400 mg total)  by mouth 2 (two) times daily for 10 days.   polyethylene glycol powder 17 GM/SCOOP powder Commonly known as: GLYCOLAX/MIRALAX Take 17 g by mouth daily as needed for moderate constipation.    sulfamethoxazole-trimethoprim 800-160 MG tablet Commonly known as: BACTRIM DS Take 1 tablet by mouth daily.        Follow-up Information     Primary care provider Follow up in 1 week(s).                   Time coordinating discharge: 39 minutes  Signed:  Averie Meiner  [facility] 09/02/2023, 12:55 PM

## 2023-09-02 NOTE — Progress Notes (Signed)
 [facility]    Date of Admission:  08/25/2023     ID: Jeremiah Stevens is a 48 y.o. adult with  advanced hiv disease Principal Problem:   Cryptococcosis (HCC) Active Problems:   Hyperosmolar hyperglycemic state (HHS) (HCC)   Seizure (HCC)   Acute respiratory failure with hypoxia (HCC)   Acute encephalopathy   Hyperammonemia (HCC)   Pancytopenia (HCC)    Subjective: Afebrile. Looking forward to going home  Medications:   azithromycin  1,200 mg Oral Weekly   bictegravir-emtricitabine-tenofovir AF  1 tablet Oral Daily   Chlorhexidine Gluconate Cloth  6 each Topical Daily   diltiazem  60 mg Oral Q6H   enoxaparin (LOVENOX) injection  60 mg Subcutaneous Q24H   fluconazole  800 mg Oral Daily   Followed by   Melene Muller ON 09/10/2023] fluconazole  400 mg Oral Daily   insulin aspart  0-15 Units Subcutaneous TID WC   insulin glargine-yfgn  12 Units Subcutaneous BID   lactulose  10 g Oral Daily   magnesium oxide  400 mg Oral BID   mouth rinse  15 mL Mouth Rinse 4 times per day   sulfamethoxazole-trimethoprim  1 tablet Oral Daily    Objective: Vital signs in last 24 hours: Temp:  [98.1 F (36.7 C)-98.8 F (37.1 C)] 98.4 F (36.9 C) (02/20 0803) Pulse Rate:  [93-96] 93 (02/20 0803) Resp:  [18] 18 (02/20 0550) BP: (124-158)/(69-102) 124/81 (02/20 0803) SpO2:  [98 %-100 %] 98 % (02/20 0803) Weight:  [120 kg] 120 kg (02/20 0500) Physical Exam  Constitutional: He is oriented to person, place, and time. He appears well-developed and well-nourished. No distress.  HENT:  Mouth/Throat: Oropharynx is clear and moist. No oropharyngeal exudate.  Cardiovascular: Normal rate, regular rhythm and normal heart sounds. Exam reveals no gallop and no friction rub.  No murmur heard.  Pulmonary/Chest: Effort normal and breath sounds normal. No respiratory distress. He has no wheezes.  Abdominal: Soft. Bowel sounds are normal. He exhibits no distension. There is no  tenderness.  Lymphadenopathy:  He has no cervical adenopathy.  Neurological: He is alert and oriented to person, place, and time.  Skin: Skin is warm and dry. No rash noted. No erythema.  Psychiatric: He has a normal mood and affect. His behavior is normal.    Lab Results Recent Labs    09/01/23 0557 09/02/23 0300  WBC  --  2.9*  HGB  --  10.6*  HCT  --  31.2*  NA 138 143  K 4.0 4.2  CL 111 115*  CO2 19* 20*  BUN 21* 20  CREATININE 2.26* 2.02*   Liver Panel No results for input(s): "PROT", "ALBUMIN", "AST", "ALT", "ALKPHOS", "BILITOT", "BILIDIR", "IBILI" in the last 72 hours. Sedimentation Rate No results for input(s): "ESRSEDRATE" in the last 72 hours. C-Reactive Protein No results for input(s): "CRP" in the last 72 hours.  Microbiology:  Studies/Results: No results found.   Assessment/Plan: HIV disease =continue on biktarvy daily  Oi proph = continue on bactrim ds daily plus azithromycin 1200mg  po weekly  Cryptococcal antigenemia = plan for 2 wk of fluconazole 800mg  then drop down to 400mg  po daily x 10 wk. This information is on prescription sent to [facility] pharmacy. We will check LFT at clinic visit and reassure adherence  Aki from L-ampho = appears steadily improving. Recommend good oral intake at home. We will check cr at clinic follow up  T2DM = please dispense necessary insulin for patient  Htn = continue on diltiazem.  RTC in 14 days for follow up.  Will sign off  [facility] Pager: [phone]  09/02/2023, 11:23 AM

## 2023-09-02 NOTE — Plan of Care (Signed)
 Adequate for discharge.

## 2023-09-03 ENCOUNTER — Encounter (HOSPITAL_COMMUNITY): Payer: Self-pay | Admitting: Internal Medicine

## 2023-09-03 LAB — FUNGUS CULTURE, BLOOD
Culture: NO GROWTH
Special Requests: ADEQUATE

## 2023-09-09 LAB — HIV-1 RNA, PCR (GRAPH) RFX/GENO EDI
HIV-1 RNA BY PCR: 1300000 {copies}/mL
HIV-1 RNA Quant, Log: 6.114 {Log_copies}/mL

## 2023-09-17 LAB — CULTURE, FUNGUS WITHOUT SMEAR

## 2023-09-23 ENCOUNTER — Ambulatory Visit: Payer: Medicaid Other | Admitting: Internal Medicine

## 2023-09-25 ENCOUNTER — Emergency Department (HOSPITAL_COMMUNITY)

## 2023-09-25 ENCOUNTER — Emergency Department (HOSPITAL_COMMUNITY): Admission: EM | Admit: 2023-09-25 | Discharge: 2023-09-25 | Disposition: A | Discharge status: Home / Self Care

## 2023-09-25 ENCOUNTER — Other Ambulatory Visit: Payer: Self-pay

## 2023-09-25 ENCOUNTER — Encounter (HOSPITAL_COMMUNITY): Payer: Self-pay | Admitting: *Deleted

## 2023-09-25 DIAGNOSIS — Z91010 Allergy to peanuts: Secondary | ICD-10-CM | POA: Insufficient documentation

## 2023-09-25 DIAGNOSIS — Z794 Long term (current) use of insulin: Secondary | ICD-10-CM | POA: Diagnosis not present

## 2023-09-25 DIAGNOSIS — R519 Headache, unspecified: Secondary | ICD-10-CM | POA: Insufficient documentation

## 2023-09-25 LAB — URINALYSIS, ROUTINE W REFLEX MICROSCOPIC
Bacteria, UA: NONE SEEN
Bilirubin Urine: NEGATIVE
Glucose, UA: >=500 mg/dL — AB
Ketones, ur: NEGATIVE mg/dL
Leukocytes,Ua: NEGATIVE
Nitrite: NEGATIVE
Protein, ur: 30 mg/dL — AB
Specific Gravity, Urine: 1.028 (ref 1.005–1.030)
pH: 5 (ref 5.0–8.0)

## 2023-09-25 LAB — CBC
HCT: 33 % — ABNORMAL LOW (ref 39.0–52.0)
Hemoglobin: 11.5 g/dL — ABNORMAL LOW (ref 13.0–17.0)
MCH: 29.9 pg (ref 26.0–34.0)
MCHC: 34.8 g/dL (ref 30.0–36.0)
MCV: 85.7 fL (ref 80.0–100.0)
Platelets: 215 10*3/uL (ref 150–400)
RBC: 3.85 MIL/uL — ABNORMAL LOW (ref 4.22–5.81)
RDW: 14.2 % (ref 11.5–15.5)
WBC: 5.6 10*3/uL (ref 4.0–10.5)
nRBC: 0 % (ref 0.0–0.2)

## 2023-09-25 LAB — COMPREHENSIVE METABOLIC PANEL
ALT: 45 U/L — ABNORMAL HIGH (ref 0–44)
AST: 29 U/L (ref 15–41)
Albumin: 3.2 g/dL — ABNORMAL LOW (ref 3.5–5.0)
Alkaline Phosphatase: 81 U/L (ref 38–126)
Anion gap: 12 (ref 5–15)
BUN: 11 mg/dL (ref 6–20)
CO2: 21 mmol/L — ABNORMAL LOW (ref 22–32)
Calcium: 9.9 mg/dL (ref 8.9–10.3)
Chloride: 97 mmol/L — ABNORMAL LOW (ref 98–111)
Creatinine, Ser: 1.18 mg/dL (ref 0.61–1.24)
GFR, Estimated: >60 mL/min (ref >60)
Glucose, Bld: 355 mg/dL — ABNORMAL HIGH (ref 70–99)
Potassium: 3.8 mmol/L (ref 3.5–5.1)
Sodium: 130 mmol/L — ABNORMAL LOW (ref 135–145)
Total Bilirubin: 0.4 mg/dL (ref 0.0–1.2)
Total Protein: 9 g/dL — ABNORMAL HIGH (ref 6.5–8.1)

## 2023-09-25 MED ORDER — INSULIN ASPART 100 UNIT/ML IJ SOLN
8.0000 [IU] | Freq: Once | INTRAMUSCULAR | Status: AC
  Administered 2023-09-25: 8 [IU] via SUBCUTANEOUS

## 2023-09-25 MED ORDER — PROCHLORPERAZINE EDISYLATE 10 MG/2ML IJ SOLN
10.0000 mg | Freq: Once | INTRAMUSCULAR | Status: AC
  Administered 2023-09-25: 10 mg via INTRAVENOUS
  Filled 2023-09-25: qty 2

## 2023-09-25 MED ORDER — ACETAMINOPHEN 500 MG PO TABS
1000.0000 mg | ORAL_TABLET | Freq: Once | ORAL | Status: AC
  Administered 2023-09-25: 1000 mg via ORAL
  Filled 2023-09-25: qty 2

## 2023-09-25 NOTE — ED Provider Notes (Signed)
 [facility] AT [facility] Provider Note   CSN: [id number] Arrival date & time: 09/25/23  0036     History  Chief Complaint  Patient presents with   Headache    Jeremiah Stevens is a 48 y.o. adult.  Patient complains of headache x 3 days, right-sided.  Also notes globus sensation for the past several days.  No vision changes, facial droop, weakness.  No chest pain or shortness of breath.   Headache      Home Medications Prior to Admission medications   Medication Sig Start Date End Date Taking? Authorizing Provider  Accu-Chek Softclix Lancets lancets Use as directed 3 (three) times daily to check blood sugar. 09/02/23   Pokhrel, Rebekah Chesterfield, MD  amLODipine (NORVASC) 10 MG tablet Take 1 tablet (10 mg total) by mouth daily. 08/04/22   Rodolph Bong, MD  azithromycin (ZITHROMAX) 600 MG tablet Take 2 tablets (1,200 mg total) by mouth every Saturday. 09/04/23   Judyann Munson, MD  bictegravir-emtricitabine-tenofovir AF (BIKTARVY) 50-200-25 MG TABS tablet Take 1 tablet by mouth daily. 08/05/22   Rodolph Bong, MD  bictegravir-emtricitabine-tenofovir AF (BIKTARVY) 50-200-25 MG TABS tablet Take 1 tablet by mouth daily. 09/02/23   Judyann Munson, MD  Blood Glucose Monitoring Suppl (BLOOD GLUCOSE MONITOR SYSTEM) w/Device KIT Use as directed 3 (three) times daily. 09/02/23   Pokhrel, Rebekah Chesterfield, MD  diltiazem (CARDIZEM CD) 360 MG 24 hr capsule Take 1 capsule (360 mg total) by mouth daily. 09/02/23 09/01/24  Pokhrel, Rebekah Chesterfield, MD  famotidine (PEPCID) 20 MG tablet Take 1 tablet (20 mg total) by mouth daily. 08/05/22   Rodolph Bong, MD  fluconazole (DIFLUCAN) 200 MG tablet Take 4 tablets (800 mg total) by mouth daily for 7 days, THEN 2 tablets (400 mg total) daily for 23 days. 09/02/23 10/02/23  Judyann Munson, MD  fluticasone (FLONASE) 50 MCG/ACT nasal spray Place 2 sprays into both nostrils daily. 08/05/22   Rodolph Bong, MD  Glucose Blood (BLOOD GLUCOSE  TEST STRIPS) STRP Use as directed 3 (three) times daily to check blood sugar. 09/02/23   Pokhrel, Rebekah Chesterfield, MD  insulin glargine (LANTUS) 100 UNIT/ML Solostar Pen Inject 12 Units into the skin 2 (two) times daily. 09/02/23 05/29/24  Pokhrel, Rebekah Chesterfield, MD  Insulin Pen Needle 32G X 4 MM MISC Use as directed 3 (three) times daily. 09/02/23   Pokhrel, Rebekah Chesterfield, MD  Ipratropium-Albuterol (COMBIVENT RESPIMAT) 20-100 MCG/ACT AERS respimat Inhale 1 puff into the lungs 3 (three) times daily for 7 days, THEN 1 puff every 6 (six) hours as needed for wheezing. 08/04/22 09/10/22  Rodolph Bong, MD  Lancet Device MISC 1 each by Does not apply route 3 (three) times daily. May dispense any manufacturer covered by patient's insurance. 09/02/23   Pokhrel, Rebekah Chesterfield, MD  loratadine (CLARITIN) 10 MG tablet Take 1 tablet (10 mg total) by mouth daily. 08/05/22   Rodolph Bong, MD  metoprolol tartrate (LOPRESSOR) 25 MG tablet Take 1 tablet (25 mg total) by mouth 2 (two) times daily. 08/04/22 11/02/22  Rodolph Bong, MD  nicotine (NICODERM CQ - DOSED IN MG/24 HOURS) 21 mg/24hr patch Place 1 patch (21 mg total) onto the skin daily. 08/05/22   Rodolph Bong, MD  polyethylene glycol powder (GLYCOLAX/MIRALAX) 17 GM/SCOOP powder Take 17 g by mouth daily as needed for moderate constipation. 09/02/23   Pokhrel, Rebekah Chesterfield, MD  sulfamethoxazole-trimethoprim (BACTRIM DS) 800-160 MG tablet Take 1 tablet by mouth daily. 08/05/22   Rodolph Bong, MD  sulfamethoxazole-trimethoprim (BACTRIM DS) 800-160 MG tablet Take 1 tablet by mouth daily. 09/02/23   Judyann Munson, MD      Allergies    Penicillins, Peanut-containing drug products, and Sustiva [efavirenz]    Review of Systems   Review of Systems  Neurological:  Positive for headaches.    Physical Exam Updated Vital Signs BP (!) 151/100 (BP Location: Right Arm)   Pulse (!) 101   Temp 98.4 F (36.9 C)   Resp 17   Ht 5\' 7"  (1.702 m)   Wt 120 kg   SpO2 93%   BMI 41.43 kg/m   Physical Exam Vitals and nursing note reviewed.  Constitutional:      General: She is not in acute distress.    Appearance: She is not toxic-appearing.  HENT:     Head: Normocephalic and atraumatic.  Eyes:     General: No visual field deficit. Cardiovascular:     Rate and Rhythm: Normal rate and regular rhythm.  Pulmonary:     Effort: Pulmonary effort is normal.  Abdominal:     General: Bowel sounds are normal.     Palpations: Abdomen is soft.  Musculoskeletal:        General: Normal range of motion.     Cervical back: Normal range of motion.  Skin:    General: Skin is warm and dry.     Capillary Refill: Capillary refill takes less than 2 seconds.  Neurological:     Mental Status: She is alert.     Cranial Nerves: No cranial nerve deficit, dysarthria or facial asymmetry.     Sensory: No sensory deficit.     Motor: No weakness.     Coordination: Coordination normal.  Psychiatric:        Mood and Affect: Mood normal.        Behavior: Behavior normal.     ED Results / Procedures / Treatments   Labs (all labs ordered are listed, but only abnormal results are displayed) Labs Reviewed  COMPREHENSIVE METABOLIC PANEL - Abnormal; Notable for the following components:      Result Value   Sodium 130 (*)    Chloride 97 (*)    CO2 21 (*)    Glucose, Bld 355 (*)    Total Protein 9.0 (*)    Albumin 3.2 (*)    ALT 45 (*)    All other components within normal limits  CBC - Abnormal; Notable for the following components:   RBC 3.85 (*)    Hemoglobin 11.5 (*)    HCT 33.0 (*)    All other components within normal limits  URINALYSIS, ROUTINE W REFLEX MICROSCOPIC - Abnormal; Notable for the following components:   Glucose, UA >=500 (*)    Hgb urine dipstick SMALL (*)    Protein, ur 30 (*)    All other components within normal limits    EKG None  Radiology CT Head Wo Contrast Result Date: 09/25/2023 CLINICAL DATA:  Headache EXAM: CT HEAD WITHOUT CONTRAST TECHNIQUE:  Contiguous axial images were obtained from the base of the skull through the vertex without intravenous contrast. RADIATION DOSE REDUCTION: This exam was performed according to the departmental dose-optimization program which includes automated exposure control, adjustment of the mA and/or kV according to patient size and/or use of iterative reconstruction technique. COMPARISON:  08/25/2023 FINDINGS: Brain: No mass,hemorrhage or extra-axial collection. Normal appearance of the parenchyma and CSF spaces. Vascular: No hyperdense vessel or unexpected vascular calcification. Skull: The visualized skull base, calvarium and  extracranial soft tissues are normal. Sinuses/Orbits: No fluid levels or advanced mucosal thickening of the visualized paranasal sinuses. No mastoid or middle ear effusion. Normal orbits. Other: None. IMPRESSION: Normal head CT. Electronically Signed   By: Deatra Robinson M.D.   On: 09/25/2023 02:59    Procedures Procedures    Medications Ordered in ED Medications  insulin aspart (novoLOG) injection 8 Units (has no administration in time range)  prochlorperazine (COMPAZINE) injection 10 mg (10 mg Intravenous Given 09/25/23 0343)  acetaminophen (TYLENOL) tablet 1,000 mg (1,000 mg Oral Given 09/25/23 0343)    ED Course/ Medical Decision Making/ A&P Clinical Course as of 09/25/23 0353  Sat Sep 25, 2023  0301 CT Head Wo Contrast IMPRESSION: Normal head CT.   [TY]    Clinical Course User Index [TY] Coral Spikes, DO                                 Medical Decision Making Well-appearing 48 year old male presenting emergency department for headache x 3 days.  He is afebrile nontachycardic, slightly elevated blood pressure.  No localizing deficits on exam; low suspicion for acute stroke.  Moving his neck freely without signs of meningitis.  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  Had 3  days of headache.  Alert and orient x 3.  No deficits on exam.  CT head repeated no acute pathology.  He is hyperglycemic, but does not appear to be in DKA.  No leukocytosis that would suggest systemic infection.  Stable anemia.  UA without evidence of urinary tract infection.  No ketones.  Treated with migraine cocktail.  Stable for discharge to follow-up outpatient.  Amount and/or Complexity of Data Reviewed Radiology: ordered. Decision-making details documented in ED Course.  Risk OTC drugs. Prescription drug management. Decision regarding hospitalization.          Final Clinical Impression(s) / ED Diagnoses Final diagnoses:  None    Rx / DC Orders ED Discharge Orders     None         Coral Spikes, DO 09/25/23 1610

## 2023-09-25 NOTE — ED Notes (Signed)
 Patient transported to CT

## 2023-09-25 NOTE — ED Triage Notes (Signed)
 The pt has  multiple headache hiccoughs  and he has had swallowing difficulty 3-4 days ago    the pt reports that he thinks he had a stroke 2 weeks ago and he came to the hospital

## 2023-09-25 NOTE — ED Notes (Signed)
Blood sugar 330

## 2023-09-25 NOTE — ED Notes (Signed)
 Pt verbalized understanding of discharge instructions. Pt ambulated from ed with steady gait. Family to drive home

## 2023-09-25 NOTE — Discharge Instructions (Signed)
 Please return immediately develop worsening headache, vision changes, facial droop, unilateral weakness, seizure-like activity, chest pain, shortness of breath, passout, palpitations or any new or worsening symptoms that are concerning to you.  You may take over-the-counter Tylenol alternating with ibuprofen for headaches.  Please take your medications as prescribed by your primary doctors.

## 2023-09-26 ENCOUNTER — Inpatient Hospital Stay (HOSPITAL_COMMUNITY)
Admission: EM | Admit: 2023-09-26 | Discharge: 2023-11-10 | DRG: 004 | Disposition: A | Discharge status: Skilled Nursing Facility | Attending: Internal Medicine | Admitting: Internal Medicine

## 2023-09-26 ENCOUNTER — Emergency Department (HOSPITAL_COMMUNITY)

## 2023-09-26 ENCOUNTER — Encounter (HOSPITAL_COMMUNITY): Payer: Self-pay | Admitting: Emergency Medicine

## 2023-09-26 ENCOUNTER — Inpatient Hospital Stay (HOSPITAL_COMMUNITY)

## 2023-09-26 ENCOUNTER — Other Ambulatory Visit: Payer: Self-pay

## 2023-09-26 DIAGNOSIS — E1122 Type 2 diabetes mellitus with diabetic chronic kidney disease: Secondary | ICD-10-CM | POA: Diagnosis present

## 2023-09-26 DIAGNOSIS — R502 Drug induced fever: Secondary | ICD-10-CM | POA: Diagnosis not present

## 2023-09-26 DIAGNOSIS — J9621 Acute and chronic respiratory failure with hypoxia: Secondary | ICD-10-CM | POA: Diagnosis not present

## 2023-09-26 DIAGNOSIS — G8191 Hemiplegia, unspecified affecting right dominant side: Secondary | ICD-10-CM | POA: Diagnosis not present

## 2023-09-26 DIAGNOSIS — I468 Cardiac arrest due to other underlying condition: Secondary | ICD-10-CM | POA: Diagnosis not present

## 2023-09-26 DIAGNOSIS — R270 Ataxia, unspecified: Secondary | ICD-10-CM | POA: Diagnosis not present

## 2023-09-26 DIAGNOSIS — Z1152 Encounter for screening for COVID-19: Secondary | ICD-10-CM | POA: Diagnosis not present

## 2023-09-26 DIAGNOSIS — J690 Pneumonitis due to inhalation of food and vomit: Secondary | ICD-10-CM | POA: Diagnosis present

## 2023-09-26 DIAGNOSIS — J189 Pneumonia, unspecified organism: Secondary | ICD-10-CM | POA: Diagnosis present

## 2023-09-26 DIAGNOSIS — R471 Dysarthria and anarthria: Secondary | ICD-10-CM | POA: Diagnosis not present

## 2023-09-26 DIAGNOSIS — B20 Human immunodeficiency virus [HIV] disease: Secondary | ICD-10-CM | POA: Diagnosis present

## 2023-09-26 DIAGNOSIS — I6389 Other cerebral infarction: Secondary | ICD-10-CM | POA: Diagnosis not present

## 2023-09-26 DIAGNOSIS — I5032 Chronic diastolic (congestive) heart failure: Secondary | ICD-10-CM | POA: Diagnosis present

## 2023-09-26 DIAGNOSIS — Z880 Allergy status to penicillin: Secondary | ICD-10-CM | POA: Diagnosis not present

## 2023-09-26 DIAGNOSIS — J9602 Acute respiratory failure with hypercapnia: Secondary | ICD-10-CM | POA: Diagnosis present

## 2023-09-26 DIAGNOSIS — Z23 Encounter for immunization: Secondary | ICD-10-CM | POA: Diagnosis not present

## 2023-09-26 DIAGNOSIS — B59 Pneumocystosis: Secondary | ICD-10-CM

## 2023-09-26 DIAGNOSIS — Z794 Long term (current) use of insulin: Secondary | ICD-10-CM | POA: Diagnosis not present

## 2023-09-26 DIAGNOSIS — I130 Hypertensive heart and chronic kidney disease with heart failure and stage 1 through stage 4 chronic kidney disease, or unspecified chronic kidney disease: Secondary | ICD-10-CM | POA: Diagnosis present

## 2023-09-26 DIAGNOSIS — N179 Acute kidney failure, unspecified: Secondary | ICD-10-CM | POA: Diagnosis not present

## 2023-09-26 DIAGNOSIS — J9690 Respiratory failure, unspecified, unspecified whether with hypoxia or hypercapnia: Secondary | ICD-10-CM | POA: Diagnosis not present

## 2023-09-26 DIAGNOSIS — E876 Hypokalemia: Secondary | ICD-10-CM | POA: Diagnosis not present

## 2023-09-26 DIAGNOSIS — R001 Bradycardia, unspecified: Secondary | ICD-10-CM | POA: Diagnosis not present

## 2023-09-26 DIAGNOSIS — E781 Pure hyperglyceridemia: Secondary | ICD-10-CM | POA: Diagnosis present

## 2023-09-26 DIAGNOSIS — I639 Cerebral infarction, unspecified: Secondary | ICD-10-CM

## 2023-09-26 DIAGNOSIS — I469 Cardiac arrest, cause unspecified: Secondary | ICD-10-CM

## 2023-09-26 DIAGNOSIS — R1312 Dysphagia, oropharyngeal phase: Secondary | ICD-10-CM | POA: Diagnosis present

## 2023-09-26 DIAGNOSIS — B451 Cerebral cryptococcosis: Secondary | ICD-10-CM | POA: Diagnosis not present

## 2023-09-26 DIAGNOSIS — Z5982 Transportation insecurity: Secondary | ICD-10-CM

## 2023-09-26 DIAGNOSIS — B459 Cryptococcosis, unspecified: Secondary | ICD-10-CM | POA: Diagnosis present

## 2023-09-26 DIAGNOSIS — J9601 Acute respiratory failure with hypoxia: Secondary | ICD-10-CM | POA: Diagnosis present

## 2023-09-26 DIAGNOSIS — D638 Anemia in other chronic diseases classified elsewhere: Secondary | ICD-10-CM | POA: Diagnosis present

## 2023-09-26 DIAGNOSIS — I10 Essential (primary) hypertension: Secondary | ICD-10-CM | POA: Diagnosis not present

## 2023-09-26 DIAGNOSIS — E11649 Type 2 diabetes mellitus with hypoglycemia without coma: Secondary | ICD-10-CM | POA: Diagnosis present

## 2023-09-26 DIAGNOSIS — E119 Type 2 diabetes mellitus without complications: Secondary | ICD-10-CM | POA: Diagnosis not present

## 2023-09-26 DIAGNOSIS — B457 Disseminated cryptococcosis: Secondary | ICD-10-CM | POA: Diagnosis present

## 2023-09-26 DIAGNOSIS — Z8249 Family history of ischemic heart disease and other diseases of the circulatory system: Secondary | ICD-10-CM

## 2023-09-26 DIAGNOSIS — G40409 Other generalized epilepsy and epileptic syndromes, not intractable, without status epilepticus: Secondary | ICD-10-CM | POA: Diagnosis not present

## 2023-09-26 DIAGNOSIS — I959 Hypotension, unspecified: Secondary | ICD-10-CM | POA: Diagnosis not present

## 2023-09-26 DIAGNOSIS — Z91010 Allergy to peanuts: Secondary | ICD-10-CM

## 2023-09-26 DIAGNOSIS — D509 Iron deficiency anemia, unspecified: Secondary | ICD-10-CM | POA: Diagnosis not present

## 2023-09-26 DIAGNOSIS — Z720 Tobacco use: Secondary | ICD-10-CM | POA: Diagnosis present

## 2023-09-26 DIAGNOSIS — E870 Hyperosmolality and hypernatremia: Secondary | ICD-10-CM | POA: Diagnosis present

## 2023-09-26 DIAGNOSIS — Z66 Do not resuscitate: Secondary | ICD-10-CM | POA: Diagnosis present

## 2023-09-26 DIAGNOSIS — J129 Viral pneumonia, unspecified: Secondary | ICD-10-CM | POA: Diagnosis not present

## 2023-09-26 DIAGNOSIS — Y828 Other medical devices associated with adverse incidents: Secondary | ICD-10-CM | POA: Diagnosis not present

## 2023-09-26 DIAGNOSIS — G9341 Metabolic encephalopathy: Secondary | ICD-10-CM | POA: Diagnosis present

## 2023-09-26 DIAGNOSIS — E66811 Obesity, class 1: Secondary | ICD-10-CM | POA: Diagnosis present

## 2023-09-26 DIAGNOSIS — E1165 Type 2 diabetes mellitus with hyperglycemia: Secondary | ICD-10-CM | POA: Diagnosis present

## 2023-09-26 DIAGNOSIS — F419 Anxiety disorder, unspecified: Secondary | ICD-10-CM | POA: Diagnosis present

## 2023-09-26 DIAGNOSIS — Z7982 Long term (current) use of aspirin: Secondary | ICD-10-CM

## 2023-09-26 DIAGNOSIS — I672 Cerebral atherosclerosis: Secondary | ICD-10-CM | POA: Diagnosis present

## 2023-09-26 DIAGNOSIS — E871 Hypo-osmolality and hyponatremia: Secondary | ICD-10-CM | POA: Diagnosis present

## 2023-09-26 DIAGNOSIS — R531 Weakness: Secondary | ICD-10-CM | POA: Diagnosis not present

## 2023-09-26 DIAGNOSIS — R509 Fever, unspecified: Secondary | ICD-10-CM | POA: Diagnosis not present

## 2023-09-26 DIAGNOSIS — R29810 Facial weakness: Secondary | ICD-10-CM | POA: Diagnosis not present

## 2023-09-26 DIAGNOSIS — Z91148 Patient's other noncompliance with medication regimen for other reason: Secondary | ICD-10-CM

## 2023-09-26 DIAGNOSIS — D6489 Other specified anemias: Secondary | ICD-10-CM | POA: Diagnosis present

## 2023-09-26 DIAGNOSIS — Z833 Family history of diabetes mellitus: Secondary | ICD-10-CM

## 2023-09-26 DIAGNOSIS — Z91119 Patient's noncompliance with dietary regimen due to unspecified reason: Secondary | ICD-10-CM

## 2023-09-26 DIAGNOSIS — F1220 Cannabis dependence, uncomplicated: Secondary | ICD-10-CM | POA: Diagnosis present

## 2023-09-26 DIAGNOSIS — R7989 Other specified abnormal findings of blood chemistry: Secondary | ICD-10-CM | POA: Diagnosis not present

## 2023-09-26 DIAGNOSIS — J9691 Respiratory failure, unspecified with hypoxia: Secondary | ICD-10-CM | POA: Diagnosis not present

## 2023-09-26 DIAGNOSIS — I5030 Unspecified diastolic (congestive) heart failure: Secondary | ICD-10-CM | POA: Diagnosis not present

## 2023-09-26 DIAGNOSIS — I69391 Dysphagia following cerebral infarction: Secondary | ICD-10-CM | POA: Diagnosis not present

## 2023-09-26 DIAGNOSIS — Z6834 Body mass index (BMI) 34.0-34.9, adult: Secondary | ICD-10-CM

## 2023-09-26 DIAGNOSIS — G4700 Insomnia, unspecified: Secondary | ICD-10-CM | POA: Diagnosis not present

## 2023-09-26 DIAGNOSIS — Z781 Physical restraint status: Secondary | ICD-10-CM

## 2023-09-26 DIAGNOSIS — G936 Cerebral edema: Secondary | ICD-10-CM | POA: Diagnosis present

## 2023-09-26 DIAGNOSIS — Z7902 Long term (current) use of antithrombotics/antiplatelets: Secondary | ICD-10-CM

## 2023-09-26 DIAGNOSIS — R569 Unspecified convulsions: Secondary | ICD-10-CM | POA: Diagnosis not present

## 2023-09-26 DIAGNOSIS — R09A2 Foreign body sensation, throat: Secondary | ICD-10-CM | POA: Diagnosis present

## 2023-09-26 DIAGNOSIS — Z823 Family history of stroke: Secondary | ICD-10-CM

## 2023-09-26 DIAGNOSIS — F17210 Nicotine dependence, cigarettes, uncomplicated: Secondary | ICD-10-CM | POA: Diagnosis present

## 2023-09-26 DIAGNOSIS — E873 Alkalosis: Secondary | ICD-10-CM | POA: Diagnosis present

## 2023-09-26 DIAGNOSIS — E875 Hyperkalemia: Secondary | ICD-10-CM | POA: Diagnosis present

## 2023-09-26 DIAGNOSIS — R1310 Dysphagia, unspecified: Secondary | ICD-10-CM | POA: Diagnosis not present

## 2023-09-26 DIAGNOSIS — Z79899 Other long term (current) drug therapy: Secondary | ICD-10-CM

## 2023-09-26 DIAGNOSIS — K219 Gastro-esophageal reflux disease without esophagitis: Secondary | ICD-10-CM | POA: Diagnosis present

## 2023-09-26 DIAGNOSIS — Z8673 Personal history of transient ischemic attack (TIA), and cerebral infarction without residual deficits: Secondary | ICD-10-CM

## 2023-09-26 DIAGNOSIS — B370 Candidal stomatitis: Secondary | ICD-10-CM | POA: Diagnosis present

## 2023-09-26 DIAGNOSIS — Z930 Tracheostomy status: Secondary | ICD-10-CM | POA: Diagnosis not present

## 2023-09-26 DIAGNOSIS — T884XXA Failed or difficult intubation, initial encounter: Secondary | ICD-10-CM | POA: Diagnosis not present

## 2023-09-26 DIAGNOSIS — F640 Transsexualism: Secondary | ICD-10-CM | POA: Diagnosis present

## 2023-09-26 DIAGNOSIS — F32A Depression, unspecified: Secondary | ICD-10-CM | POA: Diagnosis present

## 2023-09-26 DIAGNOSIS — D649 Anemia, unspecified: Secondary | ICD-10-CM | POA: Diagnosis not present

## 2023-09-26 DIAGNOSIS — Z888 Allergy status to other drugs, medicaments and biological substances status: Secondary | ICD-10-CM

## 2023-09-26 DIAGNOSIS — N182 Chronic kidney disease, stage 2 (mild): Secondary | ICD-10-CM | POA: Diagnosis present

## 2023-09-26 DIAGNOSIS — Z9911 Dependence on respirator [ventilator] status: Secondary | ICD-10-CM | POA: Diagnosis not present

## 2023-09-26 DIAGNOSIS — R29703 NIHSS score 3: Secondary | ICD-10-CM | POA: Diagnosis not present

## 2023-09-26 DIAGNOSIS — Z716 Tobacco abuse counseling: Secondary | ICD-10-CM

## 2023-09-26 DIAGNOSIS — I63541 Cerebral infarction due to unspecified occlusion or stenosis of right cerebellar artery: Secondary | ICD-10-CM | POA: Diagnosis not present

## 2023-09-26 LAB — COMPREHENSIVE METABOLIC PANEL
ALT: 45 U/L — ABNORMAL HIGH (ref 0–44)
AST: 24 U/L (ref 15–41)
Albumin: 3.3 g/dL — ABNORMAL LOW (ref 3.5–5.0)
Alkaline Phosphatase: 78 U/L (ref 38–126)
Anion gap: 8 (ref 5–15)
BUN: 18 mg/dL (ref 6–20)
CO2: 28 mmol/L (ref 22–32)
Calcium: 9.4 mg/dL (ref 8.9–10.3)
Chloride: 96 mmol/L — ABNORMAL LOW (ref 98–111)
Creatinine, Ser: 1.25 mg/dL — ABNORMAL HIGH (ref 0.61–1.24)
GFR, Estimated: >60 mL/min (ref >60)
Glucose, Bld: 230 mg/dL — ABNORMAL HIGH (ref 70–99)
Potassium: 3.9 mmol/L (ref 3.5–5.1)
Sodium: 132 mmol/L — ABNORMAL LOW (ref 135–145)
Total Bilirubin: 0.7 mg/dL (ref 0.0–1.2)
Total Protein: 9.3 g/dL — ABNORMAL HIGH (ref 6.5–8.1)

## 2023-09-26 LAB — BLOOD GAS, VENOUS
Acid-Base Excess: 9 mmol/L — ABNORMAL HIGH (ref 0.0–2.0)
Bicarbonate: 36.4 mmol/L — ABNORMAL HIGH (ref 20.0–28.0)
O2 Saturation: 42.2 %
Patient temperature: 37
pCO2, Ven: 63 mmHg — ABNORMAL HIGH (ref 44–60)
pH, Ven: 7.37 (ref 7.25–7.43)
pO2, Ven: <31 mmHg — CL (ref 32–45)

## 2023-09-26 LAB — CBC WITH DIFFERENTIAL/PLATELET
Abs Immature Granulocytes: 0.02 10*3/uL (ref 0.00–0.07)
Basophils Absolute: 0 10*3/uL (ref 0.0–0.1)
Basophils Relative: 0 %
Eosinophils Absolute: 0 10*3/uL (ref 0.0–0.5)
Eosinophils Relative: 0 %
HCT: 34.6 % — ABNORMAL LOW (ref 39.0–52.0)
Hemoglobin: 11.7 g/dL — ABNORMAL LOW (ref 13.0–17.0)
Immature Granulocytes: 0 %
Lymphocytes Relative: 10 %
Lymphs Abs: 0.7 10*3/uL (ref 0.7–4.0)
MCH: 29.5 pg (ref 26.0–34.0)
MCHC: 33.8 g/dL (ref 30.0–36.0)
MCV: 87.4 fL (ref 80.0–100.0)
Monocytes Absolute: 0.5 10*3/uL (ref 0.1–1.0)
Monocytes Relative: 7 %
Neutro Abs: 5.7 10*3/uL (ref 1.7–7.7)
Neutrophils Relative %: 83 %
Platelets: 194 10*3/uL (ref 150–400)
RBC: 3.96 MIL/uL — ABNORMAL LOW (ref 4.22–5.81)
RDW: 14.5 % (ref 11.5–15.5)
WBC: 6.9 10*3/uL (ref 4.0–10.5)
nRBC: 0 % (ref 0.0–0.2)

## 2023-09-26 LAB — I-STAT CHEM 8, ED
BUN: 17 mg/dL (ref 6–20)
Calcium, Ion: 1.25 mmol/L (ref 1.15–1.40)
Chloride: 98 mmol/L (ref 98–111)
Creatinine, Ser: 1.2 mg/dL (ref 0.61–1.24)
Glucose, Bld: 226 mg/dL — ABNORMAL HIGH (ref 70–99)
HCT: 34 % — ABNORMAL LOW (ref 39.0–52.0)
Hemoglobin: 11.6 g/dL — ABNORMAL LOW (ref 13.0–17.0)
Potassium: 4.2 mmol/L (ref 3.5–5.1)
Sodium: 135 mmol/L (ref 135–145)
TCO2: 31 mmol/L (ref 22–32)

## 2023-09-26 LAB — RESP PANEL BY RT-PCR (RSV, FLU A&B, COVID)  RVPGX2
Influenza A by PCR: NEGATIVE
Influenza B by PCR: NEGATIVE
Resp Syncytial Virus by PCR: NEGATIVE
SARS Coronavirus 2 by RT PCR: NEGATIVE

## 2023-09-26 LAB — APTT: aPTT: 32 s (ref 24–36)

## 2023-09-26 LAB — PROTIME-INR
INR: 1 (ref 0.8–1.2)
Prothrombin Time: 13.7 s (ref 11.4–15.2)

## 2023-09-26 MED ORDER — INSULIN ASPART 100 UNIT/ML IJ SOLN
0.0000 [IU] | INTRAMUSCULAR | Status: DC
  Administered 2023-09-27: 5 [IU] via SUBCUTANEOUS
  Administered 2023-09-27: 1 [IU] via SUBCUTANEOUS
  Administered 2023-09-27: 3 [IU] via SUBCUTANEOUS
  Administered 2023-09-27: 5 [IU] via SUBCUTANEOUS
  Administered 2023-09-27: 2 [IU] via SUBCUTANEOUS
  Administered 2023-09-28: 1 [IU] via SUBCUTANEOUS
  Administered 2023-09-28 (×2): 2 [IU] via SUBCUTANEOUS
  Administered 2023-09-29: 3 [IU] via SUBCUTANEOUS
  Administered 2023-09-29: 1 [IU] via SUBCUTANEOUS
  Administered 2023-09-29: 7 [IU] via SUBCUTANEOUS
  Administered 2023-09-29 – 2023-10-01 (×7): 1 [IU] via SUBCUTANEOUS
  Administered 2023-10-01 (×2): 3 [IU] via SUBCUTANEOUS
  Administered 2023-10-01 (×2): 2 [IU] via SUBCUTANEOUS
  Administered 2023-10-02 (×2): 3 [IU] via SUBCUTANEOUS
  Filled 2023-09-26: qty 0.09

## 2023-09-26 MED ORDER — RACEPINEPHRINE HCL 2.25 % IN NEBU
0.5000 mL | INHALATION_SOLUTION | Freq: Once | RESPIRATORY_TRACT | Status: AC
  Administered 2023-09-26: 0.5 mL via RESPIRATORY_TRACT
  Filled 2023-09-26: qty 0.5

## 2023-09-26 MED ORDER — PANTOPRAZOLE SODIUM 40 MG IV SOLR
40.0000 mg | Freq: Once | INTRAVENOUS | Status: AC
  Administered 2023-09-26: 40 mg via INTRAVENOUS
  Filled 2023-09-26: qty 10

## 2023-09-26 MED ORDER — SODIUM CHLORIDE 0.9 % IV SOLN
2.0000 g | Freq: Once | INTRAVENOUS | Status: AC
  Administered 2023-09-26: 2 g via INTRAVENOUS
  Filled 2023-09-26: qty 12.5

## 2023-09-26 MED ORDER — AZITHROMYCIN 250 MG PO TABS
500.0000 mg | ORAL_TABLET | Freq: Every day | ORAL | Status: DC
  Administered 2023-09-27: 500 mg via ORAL
  Filled 2023-09-26: qty 2

## 2023-09-26 MED ORDER — METHYLPREDNISOLONE SODIUM SUCC 125 MG IJ SOLR
125.0000 mg | Freq: Once | INTRAMUSCULAR | Status: AC
Start: 2023-09-26 — End: 2023-09-26
  Administered 2023-09-26: 125 mg via INTRAVENOUS
  Filled 2023-09-26: qty 2

## 2023-09-26 MED ORDER — VANCOMYCIN HCL 2000 MG/400ML IV SOLN
2000.0000 mg | Freq: Once | INTRAVENOUS | Status: AC
  Administered 2023-09-27: 2000 mg via INTRAVENOUS
  Filled 2023-09-26: qty 400

## 2023-09-26 MED ORDER — FLUCONAZOLE 100 MG PO TABS
400.0000 mg | ORAL_TABLET | Freq: Every day | ORAL | Status: DC
  Administered 2023-09-27 (×2): 400 mg via ORAL
  Filled 2023-09-26: qty 4
  Filled 2023-09-26 (×2): qty 2

## 2023-09-26 MED ORDER — LACTATED RINGERS IV BOLUS
1000.0000 mL | Freq: Once | INTRAVENOUS | Status: AC
  Administered 2023-09-26: 1000 mL via INTRAVENOUS

## 2023-09-26 MED ORDER — IOHEXOL 350 MG/ML SOLN
100.0000 mL | Freq: Once | INTRAVENOUS | Status: AC | PRN
  Administered 2023-09-27: 100 mL via INTRAVENOUS

## 2023-09-26 MED ORDER — SODIUM CHLORIDE 0.9 % IV SOLN
2.0000 g | Freq: Three times a day (TID) | INTRAVENOUS | Status: DC
  Administered 2023-09-27: 2 g via INTRAVENOUS
  Filled 2023-09-26: qty 12.5

## 2023-09-26 MED ORDER — IPRATROPIUM-ALBUTEROL 0.5-2.5 (3) MG/3ML IN SOLN
3.0000 mL | Freq: Once | RESPIRATORY_TRACT | Status: AC
  Administered 2023-09-26: 3 mL via RESPIRATORY_TRACT
  Filled 2023-09-26: qty 3

## 2023-09-26 NOTE — Subjective & Objective (Addendum)
 Patient states he cannot swallow for the past 3 days also endorses headache and hiccups  now states she can swallow and is asking ot eat Reports globus sensation in the throat but no pain, now resolved Noted to be tachypneic satting 86% on room air   feels dehydrated

## 2023-09-26 NOTE — Assessment & Plan Note (Signed)
-   Order Sensitive  SSI   - continue home insulin but decreased to  20units,  -  check TSH and HgA1C  - Hold by mouth medications

## 2023-09-26 NOTE — Assessment & Plan Note (Signed)
/  AIDS. Continue Biktarvy Appreciate ID consult Continue fluconazole And Bactrim

## 2023-09-26 NOTE — ED Provider Notes (Signed)
 [facility] Provider Note   CSN: [id number] Arrival date & time: 09/26/23  1959     History  Chief Complaint  Patient presents with   Dysphagia    Jeremiah Stevens is a 48 y.o. adult with HIV, seizures, T2DM who presents with shortness of breath. Patient is able to swallow secretions but states she often feels gagged and has been vomiting. Complained of a globus sensation in her throat 3 days ago at another ED visit, however states she doesn't feel that now. Chief complaint lists dysphagia but patient now denies dysphagia. Endorses cough and SOB. Denies CP. Doesn't feel pain in her throat or any sore throat. Noted to be 86% on RA and tachypneic. Feels dehydrated d/t inability to tolerate PO.    Past Medical History:  Diagnosis Date   Cancer (HCC)    seizures   Diabetes (HCC)    Diabetes mellitus without complication (HCC)    Heartburn    occasional; OTC as needed   Herpes genitalis in men    HIV (human immunodeficiency virus infection) (HCC)    HIV disease (HCC)    HTN (hypertension)    Hyperlipidemia    Hypertension    under control with med., has been on med. x 1 yr.   Lateral malleolar fracture 09/02/2013   left   Migraines    Tear of deltoid ligament of left ankle 09/02/2013   Type 2 diabetes mellitus with hyperosmolar nonketotic hyperglycemia (HCC) 02/10/2020       Home Medications Prior to Admission medications   Medication Sig Start Date End Date Taking? Authorizing Provider  Accu-Chek Softclix Lancets lancets Use as directed 3 (three) times daily to check blood sugar. 09/02/23   Pokhrel, Rebekah Chesterfield, MD  amLODipine (NORVASC) 10 MG tablet Take 1 tablet (10 mg total) by mouth daily. 08/04/22   Rodolph Bong, MD  azithromycin (ZITHROMAX) 600 MG tablet Take 2 tablets (1,200 mg total) by mouth every Saturday. 09/04/23   Judyann Munson, MD  bictegravir-emtricitabine-tenofovir AF (BIKTARVY) 50-200-25 MG TABS tablet Take 1  tablet by mouth daily. 08/05/22   Rodolph Bong, MD  bictegravir-emtricitabine-tenofovir AF (BIKTARVY) 50-200-25 MG TABS tablet Take 1 tablet by mouth daily. 09/02/23   Judyann Munson, MD  Blood Glucose Monitoring Suppl (BLOOD GLUCOSE MONITOR SYSTEM) w/Device KIT Use as directed 3 (three) times daily. 09/02/23   Pokhrel, Rebekah Chesterfield, MD  diltiazem (CARDIZEM CD) 360 MG 24 hr capsule Take 1 capsule (360 mg total) by mouth daily. 09/02/23 09/01/24  Pokhrel, Rebekah Chesterfield, MD  famotidine (PEPCID) 20 MG tablet Take 1 tablet (20 mg total) by mouth daily. 08/05/22   Rodolph Bong, MD  fluconazole (DIFLUCAN) 200 MG tablet Take 4 tablets (800 mg total) by mouth daily for 7 days, THEN 2 tablets (400 mg total) daily for 23 days. 09/02/23 10/02/23  Judyann Munson, MD  fluticasone (FLONASE) 50 MCG/ACT nasal spray Place 2 sprays into both nostrils daily. 08/05/22   Rodolph Bong, MD  Glucose Blood (BLOOD GLUCOSE TEST STRIPS) STRP Use as directed 3 (three) times daily to check blood sugar. 09/02/23   Pokhrel, Rebekah Chesterfield, MD  insulin glargine (LANTUS) 100 UNIT/ML Solostar Pen Inject 12 Units into the skin 2 (two) times daily. 09/02/23 05/29/24  Pokhrel, Rebekah Chesterfield, MD  Insulin Pen Needle 32G X 4 MM MISC Use as directed 3 (three) times daily. 09/02/23   Pokhrel, Rebekah Chesterfield, MD  Ipratropium-Albuterol (COMBIVENT RESPIMAT) 20-100 MCG/ACT AERS respimat Inhale 1 puff into the lungs 3 (  three) times daily for 7 days, THEN 1 puff every 6 (six) hours as needed for wheezing. 08/04/22 09/10/22  Rodolph Bong, MD  Lancet Device MISC 1 each by Does not apply route 3 (three) times daily. May dispense any manufacturer covered by patient's insurance. 09/02/23   Pokhrel, Rebekah Chesterfield, MD  loratadine (CLARITIN) 10 MG tablet Take 1 tablet (10 mg total) by mouth daily. 08/05/22   Rodolph Bong, MD  metoprolol tartrate (LOPRESSOR) 25 MG tablet Take 1 tablet (25 mg total) by mouth 2 (two) times daily. 08/04/22 11/02/22  Rodolph Bong, MD  nicotine  (NICODERM CQ - DOSED IN MG/24 HOURS) 21 mg/24hr patch Place 1 patch (21 mg total) onto the skin daily. 08/05/22   Rodolph Bong, MD  polyethylene glycol powder (GLYCOLAX/MIRALAX) 17 GM/SCOOP powder Take 17 g by mouth daily as needed for moderate constipation. 09/02/23   Pokhrel, Rebekah Chesterfield, MD  sulfamethoxazole-trimethoprim (BACTRIM DS) 800-160 MG tablet Take 1 tablet by mouth daily. 08/05/22   Rodolph Bong, MD  sulfamethoxazole-trimethoprim (BACTRIM DS) 800-160 MG tablet Take 1 tablet by mouth daily. 09/02/23   Judyann Munson, MD      Allergies    Penicillins, Peanut-containing drug products, and Sustiva [efavirenz]    Review of Systems   Review of Systems A 10 point review of systems was performed and is negative unless otherwise reported in HPI.  Physical Exam Updated Vital Signs BP (!) 176/101   Pulse (!) 109   Temp 98 F (36.7 C) (Axillary)   Resp 18   SpO2 99%  Physical Exam General: Uncomfortable appearing male, sitting on the edge of the  bed.  HEENT: PERRLA, Sclera anicteric, MMM, trachea midline. Clear oropharynx. Symmetric tonsillar pillars. Normal appearing uvula hanging midline. No oropharyngeal swelling.  Cardiology: regular tachycardic rate, no murmurs/rubs/gallops. Resp: mild respiratory distress, tachypnea. Coarse lung sounds bilaterally.  Abd: Soft, non-tender, non-distended. No rebound tenderness or guarding.  GU: Deferred. MSK: No peripheral edema or signs of trauma. Extremities without deformity or TTP. No cyanosis or clubbing. Skin: warm, dry.  Neuro: A&Ox4, CNs II-XII grossly intact. MAEs. Sensation grossly intact.  Psych: Normal mood and affect.   ED Results / Procedures / Treatments   Labs (all labs ordered are listed, but only abnormal results are displayed) Labs Reviewed  CBC WITH DIFFERENTIAL/PLATELET - Abnormal; Notable for the following components:      Result Value   RBC 3.96 (*)    Hemoglobin 11.7 (*)    HCT 34.6 (*)    All other  components within normal limits  COMPREHENSIVE METABOLIC PANEL - Abnormal; Notable for the following components:   Sodium 132 (*)    Chloride 96 (*)    Glucose, Bld 230 (*)    Creatinine, Ser 1.25 (*)    Total Protein 9.3 (*)    Albumin 3.3 (*)    ALT 45 (*)    All other components within normal limits  BLOOD GAS, VENOUS - Abnormal; Notable for the following components:   pCO2, Ven 63 (*)    pO2, Ven <31 (*)    Bicarbonate 36.4 (*)    Acid-Base Excess 9.0 (*)    All other components within normal limits  I-STAT CHEM 8, ED - Abnormal; Notable for the following components:   Glucose, Bld 226 (*)    Hemoglobin 11.6 (*)    HCT 34.0 (*)    All other components within normal limits  RESP PANEL BY RT-PCR (RSV, FLU A&B, COVID)  RVPGX2  CULTURE, BLOOD (ROUTINE X 2)  CULTURE, BLOOD (ROUTINE X 2)  PROTIME-INR  APTT  RAPID URINE DRUG SCREEN, HOSP PERFORMED  I-STAT CG4 LACTIC ACID, ED    EKG None  Radiology DG Chest Portable 1 View Result Date: 09/26/2023 CLINICAL DATA:  Shortness of breath, hypoxia EXAM: PORTABLE CHEST 1 VIEW COMPARISON:  Radiograph 08/25/2023 and CT chest 08/28/2023 FINDINGS: Stable cardiomegaly. Reticulonodular opacities in the lower lungs. Retrocardiac atelectasis or consolidation. No pleural effusion or pneumothorax. No displaced rib fractures. IMPRESSION: Reticulonodular opacities in the lower lungs, suggestive of small airway infection/inflammation. Electronically Signed   By: Minerva Fester M.D.   On: 09/26/2023 21:08   DG Neck Soft Tissue Result Date: 09/26/2023 CLINICAL DATA:  Dysphagia, drooling, concern for epiglottitis EXAM: NECK SOFT TISSUES - 1+ VIEW COMPARISON:  None Available. FINDINGS: There is no evidence of retropharyngeal soft tissue swelling or epiglottic enlargement. The cervical airway is unremarkable and no radio-opaque foreign body identified. IMPRESSION: Negative. Electronically Signed   By: Minerva Fester M.D.   On: 09/26/2023 21:06   CT Head  Wo Contrast Result Date: 09/25/2023 CLINICAL DATA:  Headache EXAM: CT HEAD WITHOUT CONTRAST TECHNIQUE: Contiguous axial images were obtained from the base of the skull through the vertex without intravenous contrast. RADIATION DOSE REDUCTION: This exam was performed according to the departmental dose-optimization program which includes automated exposure control, adjustment of the mA and/or kV according to patient size and/or use of iterative reconstruction technique. COMPARISON:  08/25/2023 FINDINGS: Brain: No mass,hemorrhage or extra-axial collection. Normal appearance of the parenchyma and CSF spaces. Vascular: No hyperdense vessel or unexpected vascular calcification. Skull: The visualized skull base, calvarium and extracranial soft tissues are normal. Sinuses/Orbits: No fluid levels or advanced mucosal thickening of the visualized paranasal sinuses. No mastoid or middle ear effusion. Normal orbits. Other: None. IMPRESSION: Normal head CT. Electronically Signed   By: Deatra Robinson M.D.   On: 09/25/2023 02:59    Procedures .Critical Care  Performed by: Loetta Rough, MD Authorized by: Loetta Rough, MD   Critical care provider statement:    Critical care time (minutes):  30   Critical care was necessary to treat or prevent imminent or life-threatening deterioration of the following conditions:  Respiratory failure   Critical care was time spent personally by me on the following activities:  Development of treatment plan with patient or surrogate, discussions with consultants, evaluation of patient's response to treatment, examination of patient, ordering and review of laboratory studies, ordering and review of radiographic studies, ordering and performing treatments and interventions, pulse oximetry, re-evaluation of patient's condition and review of old charts   Care discussed with: admitting provider       Medications Ordered in ED Medications  ceFEPIme (MAXIPIME) 2 g in sodium chloride  0.9 % 100 mL IVPB (has no administration in time range)  vancomycin (VANCOREADY) IVPB 2000 mg/400 mL (has no administration in time range)  insulin aspart (novoLOG) injection 0-9 Units (has no administration in time range)  lactated ringers bolus 1,000 mL (1,000 mLs Intravenous New Bag/Given 09/26/23 2207)  Racepinephrine HCl 2.25 % nebulizer solution 0.5 mL (0.5 mLs Nebulization Given 09/26/23 2037)  methylPREDNISolone sodium succinate (SOLU-MEDROL) 125 mg/2 mL injection 125 mg (125 mg Intravenous Given 09/26/23 2051)  ipratropium-albuterol (DUONEB) 0.5-2.5 (3) MG/3ML nebulizer solution 3 mL (3 mLs Nebulization Given 09/26/23 2159)  pantoprazole (PROTONIX) injection 40 mg (40 mg Intravenous Given 09/26/23 2204)    ED Course/ Medical Decision Making/ A&P  Medical Decision Making Amount and/or Complexity of Data Reviewed Labs: ordered. Decision-making details documented in ED Course. Radiology: ordered. Decision-making details documented in ED Course.  Risk OTC drugs. Prescription drug management. Decision regarding hospitalization.    This patient presents to the ED for concern of SOB, cough, this involves an extensive number of treatment options, and is a complaint that carries with it a high risk of complications and morbidity.  I considered the following differential and admission for this acute, potentially life threatening condition. Hypoxic to 80s % on RA with mild resp distress.  MDM:    DDX for dyspnea includes but is not limited to:  Initially was concerned for possible epiglottitis or RPA, given report of untreated HIV and likely immunocompromised status, or other laryngeal abnormality d/t complaint of globus sensation the other day now with resp complaints, but patient repeatedly denies globus sensation to me and denies sore throat.  Initially she was given racemic epi and also obtain lateral neck x-ray which did not demonstrate any signs of epiglottitis.   She does have coarse lung sounds and no stridor, her issues seems to be lower respiratory.  Chest x-ray demonstrates reticulonodular opacities evidence of small airway inflammation/infection.  She does not have any peripheral volume overload signs or obvious pulmonary edema to indicate CHF.  No chest pain to indicate MI.  She is sinus tachycardia on the monitor.  PE is lower on the differential.  Consider viral illness as likely cause of her symptoms though respiratory panel is negative.    Patient's respiratory status improves after humidified oxygen on high flow. Will be admitted to medicine.  Clinical Course as of 09/26/23 2301  Wynelle Link Sep 26, 2023  2053 Called RT immediately for evaluation who came to see patient. Patient mildly improved with humidified oxygen and racemic epi. Coughing up secretions. XR techs obtaining XR now. [HN]  2125 DG Neck Soft Tissue There is no evidence of retropharyngeal soft tissue swelling or epiglottic enlargement. The cervical airway is unremarkable and no radio-opaque foreign body identified.  IMPRESSION: Negative.   [HN]  2125 DG Chest Portable 1 View Reticulonodular opacities in the lower lungs, suggestive of small airway infection/inflammation.   [HN]  2129 WBC: 6.9 No leukocytosis  [HN]  2225 Resp panel by RT-PCR (RSV, Flu A&B, Covid) neg [HN]    Clinical Course User Index [HN] Loetta Rough, MD    Labs: I Ordered, and personally interpreted labs.  The pertinent results include:  those listed above  Imaging Studies ordered: I ordered imaging studies including CXR, lateral neck XR I independently visualized and interpreted imaging. I agree with the radiologist interpretation  Additional history obtained from chart review  Cardiac Monitoring: The patient was maintained on a cardiac monitor.  I personally viewed and interpreted the cardiac monitored which showed an underlying rhythm of: sinus tachycardia  Reevaluation: After the  interventions noted above, I reevaluated the patient and found that they have :improved  Social Determinants of Health: Lives independently  Disposition:  Admitted to medicine  Co morbidities that complicate the patient evaluation  Past Medical History:  Diagnosis Date   Cancer (HCC)    seizures   Diabetes (HCC)    Diabetes mellitus without complication (HCC)    Heartburn    occasional; OTC as needed   Herpes genitalis in men    HIV (human immunodeficiency virus infection) (HCC)    HIV disease (HCC)    HTN (hypertension)    Hyperlipidemia    Hypertension  under control with med., has been on med. x 1 yr.   Lateral malleolar fracture 09/02/2013   left   Migraines    Tear of deltoid ligament of left ankle 09/02/2013   Type 2 diabetes mellitus with hyperosmolar nonketotic hyperglycemia (HCC) 02/10/2020     Medicines Meds ordered this encounter  Medications   lactated ringers bolus 1,000 mL   Racepinephrine HCl 2.25 % nebulizer solution 0.5 mL   methylPREDNISolone sodium succinate (SOLU-MEDROL) 125 mg/2 mL injection 125 mg    IV methylprednisolone will be converted to either a q12h or q24h frequency with the same total daily dose (TDD).  Ordered Dose: 1 to 125 mg TDD; convert to: TDD q24h.  Ordered Dose: 126 to 250 mg TDD; convert to: TDD div q12h.  Ordered Dose: >250 mg TDD; DAW.   ipratropium-albuterol (DUONEB) 0.5-2.5 (3) MG/3ML nebulizer solution 3 mL   pantoprazole (PROTONIX) injection 40 mg   ceFEPIme (MAXIPIME) 2 g in sodium chloride 0.9 % 100 mL IVPB    Antibiotic Indication::   HCAP   vancomycin (VANCOREADY) IVPB 2000 mg/400 mL    Indication::   Pneumonia   insulin aspart (novoLOG) injection 0-9 Units    Correction coverage::   Sensitive (thin, NPO, renal)    CBG < 70::   Implement Hypoglycemia Standing Orders and refer to Hypoglycemia Standing Orders sidebar report    CBG 70 - 120::   0 units    CBG 121 - 150::   1 unit    CBG 151 - 200::   2 units    CBG  201 - 250::   3 units    CBG 251 - 300::   5 units    CBG 301 - 350::   7 units    CBG 351 - 400:   9 units    CBG > 400:   call MD and obtain STAT lab verification    I have reviewed the patients home medicines and have made adjustments as needed  Problem List / ED Course: Problem List Items Addressed This Visit   None               This note was created using dictation software, which may contain spelling or grammatical errors.    Loetta Rough, MD 10/08/23 [phone]

## 2023-09-26 NOTE — H&P (Signed)
 Jeremiah Stevens:[id number] DOB: 14-Jun-1976 DOA: 09/26/2023     PCP: Jeremiah Munson, MD     Patient arrived to ER on 09/26/23 at 1959 Referred by Attending Jeremiah Rough, MD   Patient coming from:    Home   Chief Complaint:   Chief Complaint  Patient presents with   Dysphagia    HPI: Jeremiah Stevens is a 48 y.o. adult with medical history significant of diabetes type 2, seizure disorder, HIV untreated, obesity, polysubstance abuse    Presented with inability to swallow  Patient states he cannot swallow for the past 3 days also endorses headache and hiccups and drooling Reports globus sensation in the throat but no pain Noted to be tachypneic satting 86% on room air Because unable to tolerate anything by mouth feels dehydrated   Patient states that he has been using his home medications as prescribed   Denies significant ETOH intake   Does  smoke   Lab Results  Component Value Date   SARSCOV2NAA NEGATIVE 09/26/2023   SARSCOV2NAA NEGATIVE 08/25/2023   SARSCOV2NAA NEGATIVE 08/02/2022   SARSCOV2NAA POSITIVE (A) 10/02/2020        Regarding pertinent Chronic problems:   Poorly controlled HIV With poor compliance 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 Per cdc hivi guidelines        DM 2 -  Lab Results  Component Value Date   HGBA1C 11.7 (H) 08/26/2023   on insulin,        Morbid obesity-   BMI Readings from Last 1 Encounters:  09/25/23 41.43 kg/m         CKD stage II   baseline Cr 1.2 Estimated Creatinine Clearance (by C-G formula based on SCr of 1.25 mg/dL (H)) Male: 81.1 mL/min (A) Male: 90.6 mL/min (A)  Lab Results  Component Value Date   CREATININE 1.25 (H) 09/26/2023   CREATININE 1.18 09/25/2023   CREATININE 2.02 (H) 09/02/2023   Lab Results  Component  Value Date   NA 132 (L) 09/26/2023   CL 96 (L) 09/26/2023   K 3.9 09/26/2023   CO2 28 09/26/2023   BUN 18 09/26/2023   CREATININE 1.25 (H) 09/26/2023   GFRNONAA >60 09/26/2023   CALCIUM 9.4 09/26/2023   PHOS 4.3 08/28/2023   ALBUMIN 3.3 (L) 09/26/2023   GLUCOSE 230 (H) 09/26/2023      Chronic anemia - baseline hg Hemoglobin & Hematocrit  Recent Labs    09/02/23 0300 09/25/23 0058 09/26/23 2101  HGB 10.6* 11.5* 11.7*   Iron/TIBC/Ferritin/ %Sat    Component Value Date/Time   FERRITIN 232 10/04/2020 0212     Seizure DO - last seizure was in February 2025 in the setting of a [facility] felt he no longer needs antiseizure meds  While in ER: Clinical Course as of 09/26/23 2252  Sun Sep 26, 2023  2053 Called RT immediately for evaluation who came to see patient. Patient mildly improved with humidified oxygen and racemic epi. Coughing up secretions. XR techs obtaining XR now. [HN]  2125 DG Neck Soft Tissue There is no evidence of retropharyngeal soft tissue swelling or epiglottic enlargement. The cervical airway is unremarkable and no radio-opaque foreign body identified.  IMPRESSION: Negative.   [HN]  2125 DG Chest Portable 1 View Reticulonodular opacities in the lower lungs, suggestive of  small airway infection/inflammation.   [HN]  2129 WBC: 6.9 No leukocytosis  [HN]  2225 Resp panel by RT-PCR (RSV, Flu A&B, Covid) neg [HN]    Clinical Course User Index [HN] Jeremiah Rough, MD     Lab Orders         Blood culture (routine x 2)         Resp panel by RT-PCR (RSV, Flu A&B, Covid) Anterior Nasal Swab         CBC with Differential         Comprehensive metabolic panel         Blood gas, venous (at [facility] and [facility])         Protime-INR         APTT         I-stat chem 8, ED (not at [facility], [facility] or [facility])         I-Stat CG4 Lactic Acid      Plain images of neck soft tissue unremarkable    CXR - Reticulonodular opacities in the lower lungs, suggestive of  small airway infection/inflammation.    CTA chest -ordered  Following Medications were ordered in ER: Medications  ceFEPIme (MAXIPIME) 2 g in sodium chloride 0.9 % 100 mL IVPB (has no administration in time range)  vancomycin (VANCOREADY) IVPB 2000 mg/400 mL (has no administration in time range)  lactated ringers bolus 1,000 mL (1,000 mLs Intravenous New Bag/Given 09/26/23 2207)  Racepinephrine HCl 2.25 % nebulizer solution 0.5 mL (0.5 mLs Nebulization Given 09/26/23 2037)  methylPREDNISolone sodium succinate (SOLU-MEDROL) 125 mg/2 mL injection 125 mg (125 mg Intravenous Given 09/26/23 2051)  ipratropium-albuterol (DUONEB) 0.5-2.5 (3) MG/3ML nebulizer solution 3 mL (3 mLs Nebulization Given 09/26/23 2159)  pantoprazole (PROTONIX) injection 40 mg (40 mg Intravenous Given 09/26/23 2204)       ED Triage Vitals [09/26/23 2012]  Encounter Vitals Group     BP (!) 176/101     Systolic BP Percentile      Diastolic BP Percentile      Pulse Rate (!) 109     Resp 18     Temp 98 F (36.7 C)     Temp Source Axillary     SpO2 90 %     Weight      Height      Head Circumference      Peak Flow      Pain Score 4     Pain Loc      Pain Education      Exclude from Growth Chart   TMAX(24)@     _________________________________________ Significant initial  Findings: Abnormal Labs Reviewed  CBC WITH DIFFERENTIAL/PLATELET - Abnormal; Notable for the following components:      Result Value   RBC 3.96 (*)    Hemoglobin 11.7 (*)    HCT 34.6 (*)    All other components within normal limits  COMPREHENSIVE METABOLIC PANEL - Abnormal; Notable for the following components:   Sodium 132 (*)    Chloride 96 (*)    Glucose, Bld 230 (*)    Creatinine, Ser 1.25 (*)    Total Protein 9.3 (*)    Albumin 3.3 (*)    ALT 45 (*)    All other components within normal limits  BLOOD GAS, VENOUS - Abnormal; Notable for the following components:   pCO2, Ven 63 (*)    pO2, Ven <31 (*)    Bicarbonate 36.4 (*)     Acid-Base Excess 9.0 (*)  All other components within normal limits    ECG: Ordered   COVID-19 Labs  No results for input(s): "DDIMER", "FERRITIN", "LDH", "CRP" in the last 72 hours.  Lab Results  Component Value Date   SARSCOV2NAA NEGATIVE 09/26/2023   SARSCOV2NAA NEGATIVE 08/25/2023   SARSCOV2NAA NEGATIVE 08/02/2022   SARSCOV2NAA POSITIVE (A) 10/02/2020    The recent clinical data is shown below. Vitals:   09/26/23 2012 09/26/23 2038  BP: (!) 176/101   Pulse: (!) 109   Resp: 18   Temp: 98 F (36.7 C)   TempSrc: Axillary   SpO2: 90% 99%    WBC     Component Value Date/Time   WBC 6.9 09/26/2023 2101   LYMPHSABS 0.7 09/26/2023 2101   MONOABS 0.5 09/26/2023 2101   EOSABS 0.0 09/26/2023 2101   BASOSABS 0.0 09/26/2023 2101      Procalcitonin   Ordered      UA   no evidence of UTI   Urine analysis:    Component Value Date/Time   COLORURINE YELLOW 09/25/2023 0052   APPEARANCEUR CLEAR 09/25/2023 0052   LABSPEC 1.028 09/25/2023 0052   PHURINE 5.0 09/25/2023 0052   GLUCOSEU >=500 (A) 09/25/2023 0052   HGBUR SMALL (A) 09/25/2023 0052   BILIRUBINUR NEGATIVE 09/25/2023 0052   KETONESUR NEGATIVE 09/25/2023 0052   PROTEINUR 30 (A) 09/25/2023 0052   UROBILINOGEN 0.2 10/08/2011 1145   NITRITE NEGATIVE 09/25/2023 0052   LEUKOCYTESUR NEGATIVE 09/25/2023 0052    Results for orders placed or performed during the hospital encounter of 09/26/23  Resp panel by RT-PCR (RSV, Flu A&B, Covid)     Status: None   Collection Time: 09/26/23  9:01 PM   Specimen: Nasal Swab  Result Value Ref Range Status   SARS Coronavirus 2 by RT PCR NEGATIVE NEGATIVE Final         Influenza A by PCR NEGATIVE NEGATIVE Final   Influenza B by PCR NEGATIVE NEGATIVE Final         Resp Syncytial Virus by PCR NEGATIVE NEGATIVE Final          ABX started Antibiotics Given (last 72 hours)     Date/Time Action Medication Dose Rate   09/26/23 2330 New Bag/Given   ceFEPIme (MAXIPIME) 2 g in  sodium chloride 0.9 % 100 mL IVPB 2 g 200 mL/hr      Venous  Blood Gas result:  pH   7.37 Acid-Base Excess 9.0 High  mmol/L   pCO2, Ven 63 High  mmHg O2 Saturation 42.2 %  pO2, Ven <31 Low Panic  mmHg       __________________________________________________________ Recent Labs  Lab 09/25/23 0058 09/26/23 2101  NA 130* 132*  K 3.8 3.9  CO2 21* 28  GLUCOSE 355* 230*  BUN 11 18  CREATININE 1.18 1.25*  CALCIUM 9.9 9.4    Cr    stable,   Lab Results  Component Value Date   CREATININE 1.25 (H) 09/26/2023   CREATININE 1.18 09/25/2023   CREATININE 2.02 (H) 09/02/2023    Recent Labs  Lab 09/25/23 0058 09/26/23 2101  AST 29 24  ALT 45* 45*  ALKPHOS 81 78  BILITOT 0.4 0.7  PROT 9.0* 9.3*  ALBUMIN 3.2* 3.3*   Lab Results  Component Value Date   CALCIUM 9.4 09/26/2023   PHOS 4.3 08/28/2023        Plt: Lab Results  Component Value Date   PLT 194 09/26/2023    Recent Labs  Lab 09/25/23 0058 09/26/23 2101  WBC 5.6 6.9  NEUTROABS  --  5.7  HGB 11.5* 11.7*  HCT 33.0* 34.6*  MCV 85.7 87.4  PLT 215 194    HG/HCT stable,       Component Value Date/Time   HGB 11.7 (L) 09/26/2023 2101   HCT 34.6 (L) 09/26/2023 2101   MCV 87.4 09/26/2023 2101      _______________________________________________ Hospitalist was called for admission for CAP and hypoxia   The following Work up has been ordered so far:  Orders Placed This Encounter  Procedures   Blood culture (routine x 2)   Resp panel by RT-PCR (RSV, Flu A&B, Covid) Anterior Nasal Swab   DG Neck Soft Tissue   DG Chest Portable 1 View   CBC with Differential   Comprehensive metabolic panel   Blood gas, venous (at [facility])   Protime-INR   APTT   Diet NPO time specified   ED Cardiac monitoring   Consult to hospitalist   Pulse oximetry, continuous   I-stat chem 8, ED (not at [facility], [facility] or [facility])   I-Stat CG4 Lactic Acid     OTHER Significant initial  Findings:  labs showing:     DM  labs:   HbA1C: Recent Labs    08/26/23 0328  HGBA1C 11.7*    CBG (last 3)  No results for input(s): "GLUCAP" in the last 72 hours.     Cultures:    Component Value Date/Time   SDES BLOOD LEFT HAND 08/27/2023 2015   SPECREQUEST  08/27/2023 2015    BOTTLES DRAWN AEROBIC AND ANAEROBIC Blood Culture adequate volume   CULT  08/27/2023 2015    NO GROWTH 7 DAYS NO FUNGUS ISOLATED Performed at [facility], [address] [place], [place] [address]    REPTSTATUS 09/03/2023 FINAL 08/27/2023 2015     Radiological Exams on Admission: DG Chest Portable 1 View Result Date: 09/26/2023 CLINICAL DATA:  Shortness of breath, hypoxia EXAM: PORTABLE CHEST 1 VIEW COMPARISON:  Radiograph 08/25/2023 and CT chest 08/28/2023 FINDINGS: Stable cardiomegaly. Reticulonodular opacities in the lower lungs. Retrocardiac atelectasis or consolidation. No pleural effusion or pneumothorax. No displaced rib fractures. IMPRESSION: Reticulonodular opacities in the lower lungs, suggestive of small airway infection/inflammation. Electronically Signed   By: Minerva Fester M.D.   On: 09/26/2023 21:08   DG Neck Soft Tissue Result Date: 09/26/2023 CLINICAL DATA:  Dysphagia, drooling, concern for epiglottitis EXAM: NECK SOFT TISSUES - 1+ VIEW COMPARISON:  None Available. FINDINGS: There is no evidence of retropharyngeal soft tissue swelling or epiglottic enlargement. The cervical airway is unremarkable and no radio-opaque foreign body identified. IMPRESSION: Negative. Electronically Signed   By: Minerva Fester M.D.   On: 09/26/2023 21:06   CT Head Wo Contrast Result Date: 09/25/2023 CLINICAL DATA:  Headache EXAM: CT HEAD WITHOUT CONTRAST TECHNIQUE: Contiguous axial images were obtained from the base of the skull through the vertex without intravenous contrast. RADIATION DOSE REDUCTION: This exam was performed according to the departmental dose-optimization program which includes automated exposure control, adjustment of the mA  and/or kV according to patient size and/or use of iterative reconstruction technique. COMPARISON:  08/25/2023 FINDINGS: Brain: No mass,hemorrhage or extra-axial collection. Normal appearance of the parenchyma and CSF spaces. Vascular: No hyperdense vessel or unexpected vascular calcification. Skull: The visualized skull base, calvarium and extracranial soft tissues are normal. Sinuses/Orbits: No fluid levels or advanced mucosal thickening of the visualized paranasal sinuses. No mastoid or middle ear effusion. Normal orbits. Other: None. IMPRESSION: Normal head CT.  Electronically Signed   By: Deatra Robinson M.D.   On: 09/25/2023 02:59   _______________________________________________________________________________________________________ Latest  Blood pressure (!) 176/101, pulse (!) 109, temperature 98 F (36.7 C), temperature source Axillary, resp. rate 18, SpO2 99%.   Vitals  labs and radiology finding personally reviewed  Review of Systems:    Pertinent positives include:   fatigue globus sensation productive cough,  Constitutional:  No weight loss, night sweats, Fevers, chills,, weight loss  HEENT:  No headaches, Difficulty swallowing,Tooth/dental problems,Sore throat,  No sneezing, itching, ear ache, nasal congestion, post nasal drip,  Cardio-vascular:  No chest pain, Orthopnea, PND, anasarca, dizziness, palpitations.no Bilateral lower extremity swelling  GI:  No heartburn, indigestion, abdominal pain, nausea, vomiting, diarrhea, change in bowel habits, loss of appetite, melena, blood in stool, hematemesis Resp:  no shortness of breath at rest. No dyspnea on exertion, No excess mucus, n    No coughing up of blood.No change in color of mucus.No wheezing. Skin:  no rash or lesions. No jaundice GU:  no dysuria, change in color of urine, no urgency or frequency. No straining to urinate.  No flank pain.  Musculoskeletal:  No joint pain or no joint swelling. No decreased range of motion. No  back pain.  Psych:  No change in mood or affect. No depression or anxiety. No memory loss.  Neuro: no localizing neurological complaints, no tingling, no weakness, no double vision, no gait abnormality, no slurred speech, no confusion  All systems reviewed and apart from HOPI all are negative _______________________________________________________________________________________________ Past Medical History:   Past Medical History:  Diagnosis Date   Cancer (HCC)    seizures   Diabetes (HCC)    Diabetes mellitus without complication (HCC)    Heartburn    occasional; OTC as needed   Herpes genitalis in men    HIV (human immunodeficiency virus infection) (HCC)    HIV disease (HCC)    HTN (hypertension)    Hyperlipidemia    Hypertension    under control with med., has been on med. x 1 yr.   Lateral malleolar fracture 09/02/2013   left   Migraines    Tear of deltoid ligament of left ankle 09/02/2013   Type 2 diabetes mellitus with hyperosmolar nonketotic hyperglycemia (HCC) 02/10/2020    Past Surgical History:  Procedure Laterality Date   NO PAST SURGERIES     ORIF ANKLE FRACTURE Left 09/13/2013   Procedure: OPEN REDUCTION INTERNAL FIXATION (ORIF) LEFT LATERAL MALLEOLUS ANKLE FRACTURE ;  Surgeon: Dannielle Huh, MD;  Location: [facility];  Service: Orthopedics;  Laterality: Left;    Social History:  Ambulatory   independently     reports that she has been smoking cigarettes. She started smoking about 26 years ago. She has a 13.1 pack-year smoking history. She has never used smokeless tobacco. She reports that she does not currently use alcohol. She reports that she does not currently use drugs after having used the following drugs: Marijuana. Frequency: 7.00 times per week.   Family History:   Family History  Problem Relation Age of Onset   Hypertension Mother     _________________________________________________________________________________________ Allergies: Allergies  Allergen Reactions   Penicillins Anaphylaxis   Peanut-Containing Drug Products Other (See Comments)    WALNUTS - SORES ON TONGUE   Sustiva [Efavirenz] Rash    Prior to Admission medications   Medication Sig Start Date End Date Taking? Authorizing Provider  Accu-Chek Softclix Lancets lancets Use as directed 3 (three) times daily to check blood  sugar. 09/02/23   Pokhrel, Laxman, MD  amLODipine (NORVASC) 10 MG tablet Take 1 tablet (10 mg total) by mouth daily. 08/04/22   Rodolph Bong, MD  azithromycin (ZITHROMAX) 600 MG tablet Take 2 tablets (1,200 mg total) by mouth every Saturday. 09/04/23   Jeremiah Munson, MD  bictegravir-emtricitabine-tenofovir AF (BIKTARVY) 50-200-25 MG TABS tablet Take 1 tablet by mouth daily. 08/05/22   Rodolph Bong, MD  bictegravir-emtricitabine-tenofovir AF (BIKTARVY) 50-200-25 MG TABS tablet Take 1 tablet by mouth daily. 09/02/23   Jeremiah Munson, MD  Blood Glucose Monitoring Suppl (BLOOD GLUCOSE MONITOR SYSTEM) w/Device KIT Use as directed 3 (three) times daily. 09/02/23   Pokhrel, Rebekah Chesterfield, MD  diltiazem (CARDIZEM CD) 360 MG 24 hr capsule Take 1 capsule (360 mg total) by mouth daily. 09/02/23 09/01/24  Pokhrel, Rebekah Chesterfield, MD  famotidine (PEPCID) 20 MG tablet Take 1 tablet (20 mg total) by mouth daily. 08/05/22   Rodolph Bong, MD  fluconazole (DIFLUCAN) 200 MG tablet Take 4 tablets (800 mg total) by mouth daily for 7 days, THEN 2 tablets (400 mg total) daily for 23 days. 09/02/23 10/02/23  Jeremiah Munson, MD  fluticasone (FLONASE) 50 MCG/ACT nasal spray Place 2 sprays into both nostrils daily. 08/05/22   Rodolph Bong, MD  Glucose Blood (BLOOD GLUCOSE TEST STRIPS) STRP Use as directed 3 (three) times daily to check blood sugar. 09/02/23   Pokhrel, Rebekah Chesterfield, MD  insulin glargine (LANTUS) 100 UNIT/ML Solostar Pen Inject 12 Units into the skin 2 (two) times  daily. 09/02/23 05/29/24  Pokhrel, Rebekah Chesterfield, MD  Insulin Pen Needle 32G X 4 MM MISC Use as directed 3 (three) times daily. 09/02/23   Pokhrel, Rebekah Chesterfield, MD  Ipratropium-Albuterol (COMBIVENT RESPIMAT) 20-100 MCG/ACT AERS respimat Inhale 1 puff into the lungs 3 (three) times daily for 7 days, THEN 1 puff every 6 (six) hours as needed for wheezing. 08/04/22 09/10/22  Rodolph Bong, MD  Lancet Device MISC 1 each by Does not apply route 3 (three) times daily. May dispense any manufacturer covered by patient's insurance. 09/02/23   Pokhrel, Rebekah Chesterfield, MD  loratadine (CLARITIN) 10 MG tablet Take 1 tablet (10 mg total) by mouth daily. 08/05/22   Rodolph Bong, MD  metoprolol tartrate (LOPRESSOR) 25 MG tablet Take 1 tablet (25 mg total) by mouth 2 (two) times daily. 08/04/22 11/02/22  Rodolph Bong, MD  nicotine (NICODERM CQ - DOSED IN MG/24 HOURS) 21 mg/24hr patch Place 1 patch (21 mg total) onto the skin daily. 08/05/22   Rodolph Bong, MD  polyethylene glycol powder (GLYCOLAX/MIRALAX) 17 GM/SCOOP powder Take 17 g by mouth daily as needed for moderate constipation. 09/02/23   Pokhrel, Rebekah Chesterfield, MD  sulfamethoxazole-trimethoprim (BACTRIM DS) 800-160 MG tablet Take 1 tablet by mouth daily. 08/05/22   Rodolph Bong, MD  sulfamethoxazole-trimethoprim (BACTRIM DS) 800-160 MG tablet Take 1 tablet by mouth daily. 09/02/23   Jeremiah Munson, MD    ___________________________________________________________________________________________________ Physical Exam:    09/26/2023    8:12 PM 09/25/2023    4:16 AM 09/25/2023   12:48 AM  Vitals with BMI  Height   5\' 7"   Weight   264 lbs 9 oz  BMI   41.42  Systolic 176 135   Diastolic 101 98   Pulse 109 77      1. General:  in No  Acute distress   Chronically ill   -appearing 2. Psychological: Alert and   Oriented 3. Head/ENT: Dry Mucous Membranes  Head Non traumatic, neck supple                            Poor Dentition 4. SKIN:   decreased Skin turgor,  Skin clean Dry and intact no rash    5. Heart: Regular rate and rhythm no  Murmur, no Rub or gallop 6. Lungs: no wheezes or crackles coarse breath sounds 7. Abdomen: Soft,  non-tender, Non distended   obese  bowel sounds present 8. Lower extremities: no clubbing, cyanosis, no  edema 9. Neurologically Grossly intact, moving all 4 extremities equally  10. MSK: Normal range of motion    Chart has been reviewed  _____________________________________________________________________________________  Assessment/Plan 48 y.o. adult with medical history significant of diabetes type 2, seizure disorder, HIV untreated, obesity, polysubstance abuse  Admitted for CAP and hypoxia    Present on Admission:  CAP (community acquired pneumonia)  HTN (hypertension)  Human immunodeficiency virus (HIV) disease  Tobacco abuse  Uncontrolled type 2 diabetes mellitus with hyperglycemia, without long-term current use of insulin (HCC)  Acute respiratory failure with hypoxia (HCC)  Globus sensation   HTN (hypertension) Continue Cardizem  Human immunodeficiency virus (HIV) disease /AIDS. Continue Biktarvy Appreciate ID consult Continue fluconazole And Bactrim  Tobacco abuse Encourage tobacco cessation.  Provide tobacco cessation materials  Uncontrolled type 2 diabetes mellitus with hyperglycemia, without long-term current use of insulin (HCC)  - Order Sensitive SSI   - continue home insulin but decreased to  20units,  -  check TSH and HgA1C  - Hold by mouth medications    Acute respiratory failure with hypoxia (HCC) In the setting of pneumonia  this patient has acute respiratory failure with Hypoxia  Hypercarbia as documented by the presence of following: O2 saturatio< 90% on RA  Likely due to:   Pneumonia,   Provide O2 therapy and titrate as needed  Continuous pulse ox   check Pulse ox with ambulation prior to discharge   may need  TC consult for home O2 set  up Hypercarbia well compensated  flutter valve ordered   Seizure (HCC) Was in the setting of HHS at dc neurology felt no need for anti seizure meds    CAP (community acquired pneumonia) A typical COVID-pneumonia. Obtain CT chest to further evaluate Check LDH Check DFA Appreciate ID consult for tonight cover with cefepime and azithromycin  Continue Bactrim for tonight as prophylaxis dose   Other plan as per orders.  DVT prophylaxis:  SCD      Code Status:    Code Status: Prior FULL CODE  as per patient   I had personally discussed CODE STATUS with patient   ACP   none  Family Communication:   Family not at  Bedside    Diet  Diet Orders (From admission, onward)     Start     Ordered   09/26/23 2014  Diet NPO time specified  Diet effective now        09/26/23 2013            Disposition Plan:     To home once workup is complete and patient is stable   Following barriers for discharge:  Electrolytes corrected                               Anemia   stable                                                       Will need consultants to evaluate patient prior to discharge       Consult Orders  (From admission, onward)           Start     Ordered   09/26/23 2235  Consult to hospitalist  Once       Provider:  (Not yet assigned)  Question Answer Comment  Place call to: [facility]   Reason for Consult Admit      09/26/23 2234                               Would benefit from PT/OT eval prior to DC  Ordered                   Swallow eval - SLP ordered                   Diabetes care coordinator                     Consults called: Sent message to ID   Admission status:  ED Disposition     ED Disposition  Admit   Condition  --   Comment  Hospital Area: [facility] [[id number]]  Level of Care: Progressive [102]  Admit to Progressive based on following criteria:  MULTISYSTEM THREATS such as stable sepsis, metabolic/electrolyte imbalance with or without encephalopathy that is responding to early treatment.  May admit patient to [facility] or [facility] if equivalent level of care is available:: No  Covid Evaluation: Asymptomatic - no recent exposure (last 10 days) testing not required  Diagnosis: CAP (community acquired pneumonia) [[id number]]  Admitting Physician: Therisa Doyne [[id number]]  Attending Physician: Therisa Doyne [[id number]]  Certification:: I certify this patient will need inpatient services for at least 2 midnights  Expected Medical Readiness: 09/30/2023            inpatient     I Expect 2 midnight stay secondary to severity of patient's current illness need for inpatient interventions justified by the following:  hemodynamic instability despite optimal treatment (tachycardia hypoxia, hypercapnia)   Severe lab/radiological/exam abnormalities including:    CAP and extensive comorbidities including: HIV/AIDS DM2    CKD    That are currently affecting medical management.   I expect  patient to be hospitalized for 2 midnights requiring inpatient medical care.  Patient is at high risk for adverse outcome (such as loss of life or disability) if not treated.  Indication for inpatient stay as follows:  Severe change from baseline regarding mental status   New or worsening hypoxia   Need for IV antibiotics, IV fluids,      Level of care        progressive          Lab Results  Component Value Date   SARSCOV2NAA NEGATIVE 09/26/2023     Precautions: admitted as   Covid  Negative  Rimas Gilham 09/26/2023, 11:53 PM    [facility]     after 2 AM please page floor coverage PA If 7AM-7PM, please contact the day team taking care of the patient using [email or web address]

## 2023-09-26 NOTE — Progress Notes (Signed)
 ED Pharmacy Antibiotic Sign Off An antibiotic consult was received from an ED provider for Vancomycin and Cefepime per pharmacy dosing for HCAP. A chart review was completed to assess appropriateness.   Patient with noted PCN allergy.  Review of EPIC history shows patient has tolerated cephalosporins in the past.  The following one time order(s) were placed:  Vancomycin 2g IV  Cefepime 2g IV  Further antibiotic and/or antibiotic pharmacy consults should be ordered by the admitting provider if indicated.   Thank you for allowing pharmacy to be a part of this patient's care.   Maryellen Pile, [facility]  Clinical Pharmacist 09/26/23 10:45 PM

## 2023-09-26 NOTE — Assessment & Plan Note (Signed)
 Continue Cardizem.

## 2023-09-26 NOTE — Assessment & Plan Note (Signed)
 Unclear etiology May benefit from swallow eval Patient currently states that he is able to eat and has been eating honey bun while in the ER He continues to have significant cough productive of gray sputum States he may have some trouble with large bites but small bites go down easy

## 2023-09-26 NOTE — ED Triage Notes (Signed)
 Patient c/o headache, hiccups, and inability to swallow x 3 days.  Patient is drooling.  MD Jearld Fenton made aware and will be by to assess patient.

## 2023-09-26 NOTE — Assessment & Plan Note (Addendum)
 Was in the setting of HHS at [facility] neurology felt no need for anti seizure meds

## 2023-09-26 NOTE — ED Notes (Signed)
 Patient transported to CT

## 2023-09-26 NOTE — Assessment & Plan Note (Signed)
 In the setting of pneumonia  this patient has acute respiratory failure with Hypoxia  Hypercarbia as documented by the presence of following: O2 saturatio< 90% on RA  Likely due to:   Pneumonia,   Provide O2 therapy and titrate as needed  Continuous pulse ox   check Pulse ox with ambulation prior to discharge   may need  TC consult for home O2 set up Hypercarbia well compensated  flutter valve ordered

## 2023-09-26 NOTE — Progress Notes (Signed)
 Pharmacy Antibiotic Note  Jeremiah Stevens is a 48 y.o. adult admitted on 09/26/2023 with pneumonia.  Pharmacy has been consulted for Cefepime dosing.  Plan: Cefepime 2g IV q8h Azithromycin per MD F/u culture results & sensitivities     Temp (24hrs), Avg:98 F (36.7 C), Min:98 F (36.7 C), Max:98 F (36.7 C)  Recent Labs  Lab 09/25/23 0058 09/26/23 2101 09/26/23 2214  WBC 5.6 6.9  --   CREATININE 1.18 1.25* 1.20    Estimated Creatinine Clearance (by C-G formula based on SCr of 1.2 mg/dL) Male: 91.4 mL/min Male: 94.4 mL/min    Allergies  Allergen Reactions   Penicillins Anaphylaxis   Peanut-Containing Drug Products Other (See Comments)    WALNUTS - SORES ON TONGUE   Sustiva [Efavirenz] Rash    Antimicrobials this admission: 3/16 Vancomycin x 1 3/16 Cefepime >>   3/17 Azithromycin >>  Dose adjustments this admission:    Microbiology results: 3/16 BCx:      Thank you for allowing pharmacy to be a part of this patient's care.  Maryellen Pile, PharmD 09/26/2023 11:33 PM

## 2023-09-26 NOTE — Assessment & Plan Note (Signed)
 Encourage tobacco cessation.  Provide tobacco cessation materials

## 2023-09-26 NOTE — Assessment & Plan Note (Signed)
 A typical COVID-pneumonia. Obtain CT chest to further evaluate Check LDH Check DFA Appreciate ID consult for tonight cover with cefepime and azithromycin  Continue Bactrim for tonight as prophylaxis dose

## 2023-09-27 DIAGNOSIS — B20 Human immunodeficiency virus [HIV] disease: Secondary | ICD-10-CM | POA: Diagnosis not present

## 2023-09-27 DIAGNOSIS — J129 Viral pneumonia, unspecified: Secondary | ICD-10-CM | POA: Diagnosis not present

## 2023-09-27 DIAGNOSIS — J189 Pneumonia, unspecified organism: Secondary | ICD-10-CM | POA: Diagnosis not present

## 2023-09-27 LAB — CBC
HCT: 34.6 % — ABNORMAL LOW (ref 39.0–52.0)
Hemoglobin: 11.4 g/dL — ABNORMAL LOW (ref 13.0–17.0)
MCH: 29.4 pg (ref 26.0–34.0)
MCHC: 32.9 g/dL (ref 30.0–36.0)
MCV: 89.2 fL (ref 80.0–100.0)
Platelets: 193 10*3/uL (ref 150–400)
RBC: 3.88 MIL/uL — ABNORMAL LOW (ref 4.22–5.81)
RDW: 14.9 % (ref 11.5–15.5)
WBC: 6.6 10*3/uL (ref 4.0–10.5)
nRBC: 0 % (ref 0.0–0.2)

## 2023-09-27 LAB — COMPREHENSIVE METABOLIC PANEL
ALT: 42 U/L (ref 0–44)
AST: 26 U/L (ref 15–41)
Albumin: 3.1 g/dL — ABNORMAL LOW (ref 3.5–5.0)
Alkaline Phosphatase: 76 U/L (ref 38–126)
Anion gap: 8 (ref 5–15)
BUN: 19 mg/dL (ref 6–20)
CO2: 25 mmol/L (ref 22–32)
Calcium: 8.8 mg/dL — ABNORMAL LOW (ref 8.9–10.3)
Chloride: 100 mmol/L (ref 98–111)
Creatinine, Ser: 1.27 mg/dL — ABNORMAL HIGH (ref 0.61–1.24)
GFR, Estimated: >60 mL/min (ref >60)
Glucose, Bld: 249 mg/dL — ABNORMAL HIGH (ref 70–99)
Potassium: 4.4 mmol/L (ref 3.5–5.1)
Sodium: 133 mmol/L — ABNORMAL LOW (ref 135–145)
Total Bilirubin: 0.5 mg/dL (ref 0.0–1.2)
Total Protein: 9.2 g/dL — ABNORMAL HIGH (ref 6.5–8.1)

## 2023-09-27 LAB — RAPID URINE DRUG SCREEN, HOSP PERFORMED
Amphetamines: NOT DETECTED
Barbiturates: NOT DETECTED
Benzodiazepines: NOT DETECTED
Cocaine: NOT DETECTED
Opiates: NOT DETECTED
Tetrahydrocannabinol: POSITIVE — AB

## 2023-09-27 LAB — GLUCOSE, CAPILLARY: Glucose-Capillary: 124 mg/dL — ABNORMAL HIGH (ref 70–99)

## 2023-09-27 LAB — CK: Total CK: 67 U/L (ref 49–397)

## 2023-09-27 LAB — CBG MONITORING, ED
Glucose-Capillary: 132 mg/dL — ABNORMAL HIGH (ref 70–99)
Glucose-Capillary: 139 mg/dL — ABNORMAL HIGH (ref 70–99)
Glucose-Capillary: 174 mg/dL — ABNORMAL HIGH (ref 70–99)
Glucose-Capillary: 184 mg/dL — ABNORMAL HIGH (ref 70–99)
Glucose-Capillary: 231 mg/dL — ABNORMAL HIGH (ref 70–99)
Glucose-Capillary: 272 mg/dL — ABNORMAL HIGH (ref 70–99)
Glucose-Capillary: 295 mg/dL — ABNORMAL HIGH (ref 70–99)
Glucose-Capillary: 330 mg/dL — ABNORMAL HIGH (ref 70–99)

## 2023-09-27 LAB — STREP PNEUMONIAE URINARY ANTIGEN: Strep Pneumo Urinary Antigen: NEGATIVE

## 2023-09-27 LAB — PHOSPHORUS: Phosphorus: 4.6 mg/dL (ref 2.5–4.6)

## 2023-09-27 LAB — T-HELPER CELLS (CD4) COUNT (NOT AT ARMC)
CD4 % Helper T Cell: 13 % — ABNORMAL LOW (ref 33–65)
CD4 T Cell Abs: 36 /uL — ABNORMAL LOW (ref 400–1790)

## 2023-09-27 LAB — EXPECTORATED SPUTUM ASSESSMENT W GRAM STAIN, RFLX TO RESP C

## 2023-09-27 LAB — MRSA NEXT GEN BY PCR, NASAL: MRSA by PCR Next Gen: NOT DETECTED

## 2023-09-27 LAB — TSH: TSH: 0.21 u[IU]/mL — ABNORMAL LOW (ref 0.350–4.500)

## 2023-09-27 LAB — SODIUM, URINE, RANDOM: Sodium, Ur: 19 mmol/L

## 2023-09-27 LAB — MAGNESIUM: Magnesium: 1.9 mg/dL (ref 1.7–2.4)

## 2023-09-27 LAB — OSMOLALITY: Osmolality: 305 mosm/kg — ABNORMAL HIGH (ref 275–295)

## 2023-09-27 LAB — I-STAT CG4 LACTIC ACID, ED
Lactic Acid, Venous: 0.5 mmol/L (ref 0.5–1.9)
Lactic Acid, Venous: 0.8 mmol/L (ref 0.5–1.9)
Lactic Acid, Venous: 1.4 mmol/L (ref 0.5–1.9)

## 2023-09-27 LAB — OSMOLALITY, URINE: Osmolality, Ur: 331 mosm/kg (ref 300–900)

## 2023-09-27 LAB — CREATININE, URINE, RANDOM: Creatinine, Urine: 61 mg/dL

## 2023-09-27 LAB — LACTATE DEHYDROGENASE: LDH: 154 U/L (ref 98–192)

## 2023-09-27 LAB — PROCALCITONIN: Procalcitonin: 0.25 ng/mL

## 2023-09-27 MED ORDER — HYDROCODONE-ACETAMINOPHEN 5-325 MG PO TABS: 1.0000 | ORAL_TABLET | ORAL | Status: DC | PRN

## 2023-09-27 MED ORDER — SODIUM CHLORIDE 0.9 % IV SOLN
2.0000 g | Freq: Every day | INTRAVENOUS | Status: DC
  Administered 2023-09-27 – 2023-09-28 (×2): 2 g via INTRAVENOUS
  Filled 2023-09-27 (×4): qty 20

## 2023-09-27 MED ORDER — ONDANSETRON HCL 4 MG PO TABS: 4.0000 mg | ORAL_TABLET | Freq: Four times a day (QID) | ORAL | Status: DC | PRN

## 2023-09-27 MED ORDER — ONDANSETRON HCL 4 MG/2ML IJ SOLN
4.0000 mg | Freq: Four times a day (QID) | INTRAMUSCULAR | Status: DC | PRN
  Administered 2023-10-14: 4 mg via INTRAVENOUS
  Filled 2023-09-27 (×2): qty 2

## 2023-09-27 MED ORDER — SODIUM CHLORIDE 0.9 % IV SOLN: INTRAVENOUS | Status: AC

## 2023-09-27 MED ORDER — METOPROLOL TARTRATE 5 MG/5ML IV SOLN
2.5000 mg | Freq: Once | INTRAVENOUS | Status: AC | PRN
  Administered 2023-09-27: 2.5 mg via INTRAVENOUS
  Filled 2023-09-27: qty 5

## 2023-09-27 MED ORDER — BICTEGRAVIR-EMTRICITAB-TENOFOV 50-200-25 MG PO TABS
1.0000 | ORAL_TABLET | Freq: Every day | ORAL | Status: DC
  Administered 2023-09-27: 1 via ORAL
  Filled 2023-09-27 (×4): qty 1

## 2023-09-27 MED ORDER — SULFAMETHOXAZOLE-TRIMETHOPRIM 800-160 MG PO TABS
1.0000 | ORAL_TABLET | Freq: Every day | ORAL | Status: DC
  Administered 2023-09-27: 1 via ORAL
  Filled 2023-09-27 (×2): qty 1

## 2023-09-27 MED ORDER — ACETAMINOPHEN 650 MG RE SUPP: 650.0000 mg | Freq: Four times a day (QID) | RECTAL | Status: DC | PRN

## 2023-09-27 MED ORDER — IPRATROPIUM-ALBUTEROL 0.5-2.5 (3) MG/3ML IN SOLN
3.0000 mL | Freq: Four times a day (QID) | RESPIRATORY_TRACT | Status: DC
  Administered 2023-09-27 – 2023-09-30 (×10): 3 mL via RESPIRATORY_TRACT
  Filled 2023-09-27 (×13): qty 3

## 2023-09-27 MED ORDER — ACETAMINOPHEN 325 MG PO TABS
650.0000 mg | ORAL_TABLET | Freq: Four times a day (QID) | ORAL | Status: DC | PRN
  Administered 2023-09-27: 650 mg via ORAL
  Filled 2023-09-27: qty 2

## 2023-09-27 MED ORDER — INSULIN GLARGINE-YFGN 100 UNIT/ML ~~LOC~~ SOLN: 20.0000 [IU] | Freq: Every day | SUBCUTANEOUS | Status: DC

## 2023-09-27 MED ORDER — INSULIN GLARGINE 100 UNIT/ML ~~LOC~~ SOLN
20.0000 [IU] | Freq: Every day | SUBCUTANEOUS | Status: DC
  Administered 2023-09-27 – 2023-10-01 (×6): 20 [IU] via SUBCUTANEOUS
  Filled 2023-09-27 (×9): qty 0.2

## 2023-09-27 MED ORDER — LEVALBUTEROL HCL 0.63 MG/3ML IN NEBU
0.6300 mg | INHALATION_SOLUTION | Freq: Four times a day (QID) | RESPIRATORY_TRACT | Status: DC | PRN
  Administered 2023-09-30 – 2023-10-03 (×2): 0.63 mg via RESPIRATORY_TRACT
  Filled 2023-09-27 (×2): qty 3

## 2023-09-27 MED ORDER — DILTIAZEM HCL ER COATED BEADS 180 MG PO CP24
360.0000 mg | ORAL_CAPSULE | Freq: Every day | ORAL | Status: DC
Start: 2023-09-27 — End: 2023-09-29
  Administered 2023-09-27 – 2023-09-28 (×2): 360 mg via ORAL
  Filled 2023-09-27: qty 3
  Filled 2023-09-27: qty 2

## 2023-09-27 MED ORDER — POLYETHYLENE GLYCOL 3350 17 G PO PACK: 17.0000 g | PACK | Freq: Every day | ORAL | Status: DC | PRN

## 2023-09-27 MED ORDER — PNEUMOCOCCAL 20-VAL CONJ VACC 0.5 ML IM SUSY
0.5000 mL | PREFILLED_SYRINGE | INTRAMUSCULAR | Status: DC
  Filled 2023-09-27: qty 0.5

## 2023-09-27 MED ORDER — GUAIFENESIN ER 600 MG PO TB12
600.0000 mg | ORAL_TABLET | Freq: Two times a day (BID) | ORAL | Status: DC
Start: 2023-09-27 — End: 2023-09-29
  Administered 2023-09-27 – 2023-09-28 (×4): 600 mg via ORAL
  Filled 2023-09-27 (×5): qty 1

## 2023-09-27 NOTE — Evaluation (Signed)
 Occupational Therapy Evaluation Patient Details Name: Jeremiah Stevens MRN: [id number] DOB: 09-17-1975 Today's Date: 09/27/2023   History of Present Illness   Jeremiah Stevens is a 48 y.o. adult admitted with complaints if inability to swallow. PMH: diabetes type 2, seizure disorder, HIV untreated, obesity, polysubstance abuse     Clinical Impressions PTA, patient was living wit family and independent with ADL's and mobility and reports driving. Patient presents now with NPO status, with difficulty communicating clearly due to hiccups an on 10 L HFNC with an extension. SPo2 100% SpO2, amb around bed and to commode with RW off SpO2 with sats dropping to 88% then recovery to 98% once placed back on 10 ltrs. Mild decreased STM and organization noted, mild balance deficits and decreased activity tolerance. Patient will benefit from continued Acute OT services to maximize independence and safety. Patient will most likely not require further follow up OT outside of initial family support. May require a RW for higher level mobility with IADL's.      If plan is discharge home, recommend the following:   A little help with walking and/or transfers;A little help with bathing/dressing/bathroom;Assistance with cooking/housework;Assistance with feeding;Direct supervision/assist for medications management;Direct supervision/assist for financial management;Assist for transportation;Help with stairs or ramp for entrance;Supervision due to cognitive status     Functional Status Assessment   Patient has had a recent decline in their functional status and demonstrates the ability to make significant improvements in function in a reasonable and predictable amount of time.     Equipment Recommendations    (RW)      Precautions/Restrictions   Precautions Precautions: Fall Restrictions Weight Bearing Restrictions Per Provider Order: No     Mobility Bed Mobility                General bed mobility comments: seated on stretcher edge    Transfers Overall transfer level: Needs assistance   Transfers: Sit to/from Stand, Bed to chair/wheelchair/BSC             General transfer comment: mild decreased standing level balance and sit to stand cues for powering up      Balance Overall balance assessment: Needs assistance Sitting-balance support: Feet supported, No upper extremity supported Sitting balance-Leahy Scale: Good     Standing balance support: During functional activity, No upper extremity supported Standing balance-Leahy Scale: Fair                             ADL either performed or assessed with clinical judgement   ADL Overall ADL's : Needs assistance/impaired Eating/Feeding:  (currently NPO but no issues bringing hands to mouth or coordination)   Grooming: Wash/dry hands;Wash/dry face;Brushing hair;Supervision/safety;Sitting   Upper Body Bathing: Supervision/ safety;Sitting   Lower Body Bathing: Contact guard assist;Sit to/from stand;Sitting/lateral leans   Upper Body Dressing : Supervision/safety;Sitting   Lower Body Dressing: Contact guard assist;Sitting/lateral leans;Sit to/from stand Lower Body Dressing Details (indicate cue type and reason): decreased functional reach to fasten tennis shoes Toilet Transfer: Contact guard assist;Ambulation;Rolling walker (2 wheels)   Toileting- Clothing Manipulation and Hygiene: Supervision/safety       Functional mobility during ADLs: Contact guard assist;Rolling walker (2 wheels) General ADL Comments: on 10 L HF O2 via  with amb around bed and then to commode amb level with RW     Vision Baseline Vision/History: 0 No visual deficits Ability to See in Adequate Light: 0 Adequate Patient Visual Report: No change from baseline  Vision Assessment?: No apparent visual deficits     Perception Perception: Within Functional Limits       Praxis Praxis: WFL       Pertinent  Vitals/Pain Pain Assessment Pain Assessment: No/denies pain     Extremity/Trunk Assessment Upper Extremity Assessment Upper Extremity Assessment: Overall WFL for tasks assessed;Left hand dominant   Lower Extremity Assessment Lower Extremity Assessment: Defer to PT evaluation   Cervical / Trunk Assessment Cervical / Trunk Assessment: Normal   Communication Communication Communication: Impaired Factors Affecting Communication: Reduced clarity of speech   Cognition Arousal: Alert Behavior During Therapy: Restless Cognition: Cognition impaired   Orientation impairments: Person, Place, Situation (cues for date)     Attention impairment (select first level of impairment): Selective attention Executive functioning impairment (select all impairments): Organization OT - Cognition Comments: mild disorganized thinking with decreased insight                 Following commands: Intact       Cueing  General Comments      dryness on B UE/LE' skin and nails           Home Living Family/patient expects to be discharged to:: Private residence Living Arrangements: Other relatives Available Help at Discharge: Family;Available PRN/intermittently Type of Home: House Home Access: Stairs to enter [employer] of Steps: 3   Home Layout: One level     Bathroom Shower/Tub: Curtain   Firefighter: Standard Bathroom Accessibility: No   Home Equipment: None          Prior Functioning/Environment Prior Level of Function : Independent/Modified Independent                    OT Problem List: Impaired balance (sitting and/or standing);Decreased cognition;Decreased safety awareness;Cardiopulmonary status limiting activity   OT Treatment/Interventions: Self-care/ADL training;Therapeutic exercise;Neuromuscular education;Energy conservation;DME and/or AE instruction;Therapeutic activities;Cognitive remediation/compensation;Patient/family education;Balance  training      OT Goals(Current goals can be found in the care plan section)   Acute Rehab OT Goals Patient Stated Goal: to be able to swallow OT Goal Formulation: With patient Time For Goal Achievement: 10/11/23 Potential to Achieve Goals: Good ADL Goals Pt Will Perform Lower Body Dressing: with modified independence Pt Will Transfer to Toilet: regular height toilet;with supervision Additional ADL Goal #1: pt will teach back 2/3 energy conservation techniques with BADL's and mobility   OT Frequency:  Min 2X/week       AM-PAC OT "6 Clicks" Daily Activity     Outcome Measure Help from another person eating meals?: A Little (simulated due to NPO) Help from another person taking care of personal grooming?: A Little Help from another person toileting, which includes using toliet, bedpan, or urinal?: A Little Help from another person bathing (including washing, rinsing, drying)?: A Little Help from another person to put on and taking off regular upper body clothing?: A Little Help from another person to put on and taking off regular lower body clothing?: A Little 6 Click Score: 18   End of Session Equipment Utilized During Treatment: Gait belt;Rolling walker (2 wheels);Oxygen Nurse Communication: Mobility status  Activity Tolerance: Patient tolerated treatment well Patient left: in bed;with call bell/phone within reach  OT Visit Diagnosis: Unsteadiness on feet (R26.81);Cognitive communication deficit (R41.841) Symptoms and signs involving cognitive functions: Cerebral infarction;Other cerebrovascular disease                Time: 1325-1405 OT Time Calculation (min): 40 min Charges:  OT General Charges $OT Visit: 1  Visit OT Evaluation $OT Eval Low Complexity: 1 Low OT Treatments $Self Care/Home Management : 23-37 mins Kimra Kantor OT/L Acute Rehabilitation Department  [phone]  09/27/2023, 3:32 PM

## 2023-09-27 NOTE — ED Notes (Signed)
 Pt continuously takes off NS cannula, leads, and pulse oximetry. Reapplied and educated that everything must stay on. Pt stated understanding. Pt made aware of NPO diet and SLP consult. Pt found with half eaten honey bun, and mint. Nurse trashed food w/ pt consent. Education on why they could not eat and drink at this time.

## 2023-09-27 NOTE — ED Notes (Signed)
 Patient wheeled in wheelchair to floor. Patient refused and continues to self removed IV fluids and monitoring devices.

## 2023-09-27 NOTE — ED Notes (Signed)
 This RN to bedside due to pt being disconnected. Found pt at sink drinking out the sink after this RN explained she was NPO due to intolerance of fluids and foods. Pt stated understanding, but stated "I need to wet my mouth." Jeanice Lim, RN and [place], RN provided swabs to wet mouth. This RN educated the importance of staying NPO to prevent aspiration and O2 desaturation.

## 2023-09-27 NOTE — Progress Notes (Signed)
 Progress Note   Patient: Jeremiah Stevens WJX:[id number] DOB: 09-19-1975 DOA: 09/26/2023     1 DOS: the patient was seen and examined on 09/27/2023   Brief hospital course: Jeremiah Stevens is a 48 y.o. adult with medical history significant of diabetes type 2, seizure disorder, HIV untreated, obesity, polysubstance abuse .  Patient is admitted for pneumonia with hypoxia in the setting of HIV.  Assessment and Plan: 48 y.o. adult with medical history significant of diabetes type 2, seizure disorder, HIV untreated, obesity, polysubstance abuse  Admitted for CAP and hypoxia.    Present on Admission:  CAP (community acquired pneumonia)  HTN (hypertension)  Human immunodeficiency virus (HIV) disease  Tobacco abuse  Uncontrolled type 2 diabetes mellitus with hyperglycemia, without long-term current use of insulin (HCC)  Acute respiratory failure with hypoxia (HCC)  Globus sensation  CAP (community acquired pneumonia) -CT scan negative for pulmonary embolism confirms lower lung opacities suggestive of multifocal pneumonia. -Continue IV antibiotics cefepime, azithromycin, fluconazole and Bactrim - Continue supportive care with DuoNebs/Xopenex. - Continue to monitor blood cultures, respiratory cultures - Respiratory panel for flu RSV and COVID are negative - Ordered respiratory culture, urine for Legionella and Streptococcus currently pending   HTN (hypertension) Continue Cardizem   Human immunodeficiency virus (HIV) disease /AIDS. Continue Biktarvy Appreciate ID consult Continue fluconazole And Bactrim   Tobacco abuse Encourage tobacco cessation.  Provide tobacco cessation materials   Uncontrolled type 2 diabetes mellitus with hyperglycemia, without long-term current use of insulin (HCC)  - Order Sensitive SSI   - continue home insulin but decreased to  20units,  -  check TSH and HgA1C  - Hold by mouth medications      Acute respiratory failure with hypoxia (HCC) In  the setting of pneumonia  this patient has acute respiratory failure with Hypoxia  Hypercarbia as documented by the presence of following: O2 saturatio< 90% on RA   Provide O2 therapy and titrate as needed  Continuous pulse ox   check Pulse ox with ambulation prior to discharge   may need  TC consult for home O2 set up Hypercarbia well compensated  flutter valve ordered     Seizure (HCC) [facility] felt no need for anti seizure meds        Other plan as per orders.   DVT prophylaxis:  SCD         Subjective: Patient was seen and examined at bedside today.  Patient still in the ER.  Patient reports some mild improvement in shortness of breath.  Physical Exam: Vitals:   09/27/23 0823 09/27/23 0827 09/27/23 0950 09/27/23 1142  BP: (!) 164/89  (!) 164/89 (!) 170/91  Pulse: (!) 106   98  Resp: 19   16  Temp:  98.4 F (36.9 C)    TempSrc: Oral Oral    SpO2: 99%   (!) 87%   1. General:  in No  Acute distress   Chronically ill   -appearing 2. Psychological: Alert and   Oriented 3. Head/ENT: Dry Mucous Membranes                          Head Non traumatic, neck supple                            Poor Dentition 4. SKIN:  decreased Skin turgor,  Skin clean Dry and intact  no rash    5. Heart: Regular rate and rhythm no  Murmur, no Rub or gallop 6. Lungs: no wheezes or crackles coarse breath sounds 7. Abdomen: Soft,  non-tender, Non distended   obese  bowel sounds present 8. Lower extremities: no clubbing, cyanosis, no  edema 9. Neurologically Grossly intact, moving all 4 extremities equally  10. MSK: Normal range of motion Data Reviewed:  BC, CMP  Family Communication: Patient is  alert and oriented.  Disposition: Status is: Inpatient Remains inpatient appropriate because: COVID-pneumonia with hypoxia  Planned Discharge Destination: Home    Time spent: 35 minutes  Author: Harold Hedge, MD 09/27/2023 1:39 PM  For on call  review [email or web address].

## 2023-09-27 NOTE — Consult Note (Signed)
 [facility]       Reason for Consult: Hypoxemia    Referring Physician: Dr. Adela Glimpse  Principal Problem:   CAP (community acquired pneumonia) Active Problems:   Human immunodeficiency virus (HIV) disease   HTN (hypertension)   Uncontrolled type 2 diabetes mellitus with hyperglycemia, without long-term current use of insulin (HCC)   Tobacco abuse   Seizure (HCC)   Acute respiratory failure with hypoxia (HCC)   Globus sensation    bictegravir-emtricitabine-tenofovir AF  1 tablet Oral Daily   diltiazem  360 mg Oral Daily   fluconazole  400 mg Oral Daily   guaiFENesin  600 mg Oral BID   insulin aspart  0-9 Units Subcutaneous Q4H   insulin glargine  20 Units Subcutaneous QHS   ipratropium-albuterol  3 mL Nebulization QID   sulfamethoxazole-trimethoprim  1 tablet Oral Daily    Recommendations: Will change antibiotics to ceftriaxone for community-acquired pneumonia Continue Bactrim Continue Biktarvy Continue with fluconazole  Assessment: She has hypoxemia cough and productive sputum or consistent with a viral pneumonia based on CT findings of the chest.  Pneumocystis less likely with improving oxygen prior months, now down to just a nasal cannula.  Previously she was on high flow oxygen.  Also with a productive sputum and normal LDH and reportedly taking the Bactrim prophylaxis, makes pneumocystis low likelihood.  Will watch her closely though and consider treatment if there are clinical changes.  Otherwise we will continue with ceftriaxone pending sputum studies.  Evaluation of this patient requires complex antimicrobial therapy evaluation and counseling + isolation needs for disease transmission risk assessment and mitigation.   HPI: Jeremiah Stevens is a 48 y.o. adult transgender male with a history of HIV and long history of poor compliance who comes in feeling poorly with shortness of breath and difficulty swallowing.  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  She has since restarted her medications including Biktarvy and Bactrim prophylaxis at that time and reports compliance the first 2 days when she was still feeling poorly after discharge.  She now is having a cough with productive sputum.  No known sick contacts.  Subjective fever at home.  CT scan of the lungs with multifocal opacities.  Procalcitonin 0.25.  LDH within normal limits.  Review of Systems:  Constitutional: positive for fevers and chills All other systems reviewed and are negative    Past Medical History:  Diagnosis Date   Cancer (HCC)    seizures   Diabetes (HCC)    Diabetes mellitus without complication (HCC)    Heartburn    occasional; OTC as needed   Herpes genitalis in men    HIV (human immunodeficiency virus infection) (HCC)    HIV disease (HCC)    HTN (hypertension)    Hyperlipidemia    Hypertension    under control with med., has been on med. x 1 yr.   Lateral malleolar fracture 09/02/2013   left   Migraines    Tear of deltoid ligament of left ankle 09/02/2013   Type 2 diabetes mellitus with hyperosmolar nonketotic hyperglycemia (HCC) 02/10/2020    Social History   Tobacco Use   Smoking status: Every Day    Current packs/day: 0.50    Average packs/day: 0.5 packs/day for 26.2 years (13.1 ttl pk-yrs)    Types: Cigarettes    Start date: 1999  Smokeless tobacco: Never  Vaping Use   Vaping status: Unknown  Substance Use Topics   Alcohol use: Not Currently   Drug use: Not Currently    Frequency: 7.0 times per week    Types: Marijuana    Comment: Daily     Family History  Problem Relation Age of Onset   Hypertension Mother     Allergies  Allergen Reactions   Penicillins Anaphylaxis   Peanut-Containing Drug Products Other (See Comments)    WALNUTS - SORES ON TONGUE    Sustiva [Efavirenz] Rash    Physical Exam: Constitutional: in no apparent distress  Vitals:   09/27/23 0827 09/27/23 0950  BP:  (!) 164/89  Pulse:    Resp:    Temp: 98.4 F (36.9 C)   SpO2:     EYES: anicteric Respiratory: diffuse congestion, normal respiratory effort  Lab Results  Component Value Date   WBC 6.6 09/27/2023   HGB 11.4 (L) 09/27/2023   HCT 34.6 (L) 09/27/2023   MCV 89.2 09/27/2023   PLT 193 09/27/2023    Lab Results  Component Value Date   CREATININE 1.27 (H) 09/27/2023   BUN 19 09/27/2023   NA 133 (L) 09/27/2023   K 4.4 09/27/2023   CL 100 09/27/2023   CO2 25 09/27/2023    Lab Results  Component Value Date   ALT 42 09/27/2023   AST 26 09/27/2023   ALKPHOS 76 09/27/2023     Microbiology: Recent Results (from the past 240 hours)  Blood culture (routine x 2)     Status: None (Preliminary result)   Collection Time: 09/26/23  9:01 PM   Specimen: BLOOD  Result Value Ref Range Status   Specimen Description   Final    BLOOD LEFT ANTECUBITAL Performed at [facility], [address], [place], [place] [address]    Special Requests   Final    BOTTLES DRAWN AEROBIC AND ANAEROBIC Blood Culture adequate volume Performed at [facility], [address], [place], [place] [address]    Culture   Final    NO GROWTH < 12 HOURS Performed at [facility], [address], [place], [place] [address]    Report Status PENDING  Incomplete  Blood culture (routine x 2)     Status: None (Preliminary result)   Collection Time: 09/26/23  9:01 PM   Specimen: BLOOD  Result Value Ref Range Status   Specimen Description   Final    BLOOD RIGHT ANTECUBITAL Performed at [facility], [address], [place], [place] [address]    Special Requests   Final    BOTTLES DRAWN AEROBIC AND ANAEROBIC Blood Culture adequate volume Performed at [facility], [address] [place], [place]  [address]    Culture   Final    NO GROWTH < 12 HOURS Performed at [facility], [address] [place], [place] [address]    Report Status PENDING  Incomplete  Resp panel by RT-PCR (RSV, Flu A&B, Covid)     Status: None   Collection Time: 09/26/23  9:01 PM   Specimen: Nasal Swab  Result Value Ref Range Status   SARS Coronavirus 2 by RT PCR NEGATIVE NEGATIVE Final    Comment: (NOTE) SARS-CoV-2 target nucleic acids are NOT DETECTED.  The SARS-CoV-2 RNA is generally detectable in upper respiratory specimens during the acute phase of infection. The lowest concentration of SARS-CoV-2 viral copies this assay can detect is 138  copies/mL. A negative result does not preclude SARS-Cov-2 infection and should not be used as the sole basis for treatment or other patient management decisions. A negative result may occur with  improper specimen collection/handling, submission of specimen other than nasopharyngeal swab, presence of viral mutation(s) within the areas targeted by this assay, and inadequate number of viral copies(<138 copies/mL). A negative result must be combined with clinical observations, patient history, and epidemiological information. The expected result is Negative.  Fact Sheet for Patients:  BloggerCourse.com  Fact Sheet for Healthcare Providers:  SeriousBroker.it  This test is no t yet approved or cleared by the [place] FDA and  has been authorized for detection and/or diagnosis of SARS-CoV-2 by FDA under an Emergency Use Authorization (EUA). This EUA will remain  in effect (meaning this test can be used) for the duration of the COVID-19 declaration under Section 564(b)(1) of the Act, 21 U.S.C.section 360bbb-3(b)(1), unless the authorization is terminated  or revoked sooner.       Influenza A by PCR NEGATIVE NEGATIVE Final   Influenza B by PCR NEGATIVE NEGATIVE Final    Comment: (NOTE) The Xpert Xpress  SARS-CoV-2/FLU/RSV plus assay is intended as an aid in the diagnosis of influenza from Nasopharyngeal swab specimens and should not be used as a sole basis for treatment. Nasal washings and aspirates are unacceptable for Xpert Xpress SARS-CoV-2/FLU/RSV testing.  Fact Sheet for Patients: BloggerCourse.com  Fact Sheet for Healthcare Providers: SeriousBroker.it  This test is not yet approved or cleared by the [place] FDA and has been authorized for detection and/or diagnosis of SARS-CoV-2 by FDA under an Emergency Use Authorization (EUA). This EUA will remain in effect (meaning this test can be used) for the duration of the COVID-19 declaration under Section 564(b)(1) of the Act, 21 U.S.C. section 360bbb-3(b)(1), unless the authorization is terminated or revoked.     Resp Syncytial Virus by PCR NEGATIVE NEGATIVE Final    Comment: (NOTE) Fact Sheet for Patients: BloggerCourse.com  Fact Sheet for Healthcare Providers: SeriousBroker.it  This test is not yet approved or cleared by the [place] FDA and has been authorized for detection and/or diagnosis of SARS-CoV-2 by FDA under an Emergency Use Authorization (EUA). This EUA will remain in effect (meaning this test can be used) for the duration of the COVID-19 declaration under Section 564(b)(1) of the Act, 21 U.S.C. section 360bbb-3(b)(1), unless the authorization is terminated or revoked.  Performed at [facility], [address], [place], [place] [address]   Expectorated Sputum Assessment w Gram Stain, Rflx to Resp Cult     Status: None   Collection Time: 09/27/23  9:04 AM   Specimen: Sputum  Result Value Ref Range Status   Specimen Description SPU  Final   Special Requests Immunocompromised  Final   Sputum evaluation   Final    THIS SPECIMEN IS ACCEPTABLE FOR SPUTUM CULTURE Performed at [facility], [address] [place], [place] [address]    Report Status 09/27/2023 FINAL  Final    Gardiner Barefoot, MD [facility] for Infectious Disease [facility] [email or web address] 09/27/2023, 10:40 AM

## 2023-09-27 NOTE — Evaluation (Signed)
 Clinical/Bedside Swallow Evaluation Patient Details  Name: Jeremiah Stevens MRN: [id number] Date of Birth: 20-Aug-1975  Today's Date: 09/27/2023 Time: SLP Start Time (ACUTE ONLY): 0915 SLP Stop Time (ACUTE ONLY): 0940 SLP Time Calculation (min) (ACUTE ONLY): 25 min  Past Medical History:  Past Medical History:  Diagnosis Date   Cancer (HCC)    seizures   Diabetes (HCC)    Diabetes mellitus without complication (HCC)    Heartburn    occasional; OTC as needed   Herpes genitalis in men    HIV (human immunodeficiency virus infection) (HCC)    HIV disease (HCC)    HTN (hypertension)    Hyperlipidemia    Hypertension    under control with med., has been on med. x 1 yr.   Lateral malleolar fracture 09/02/2013   left   Migraines    Tear of deltoid ligament of left ankle 09/02/2013   Type 2 diabetes mellitus with hyperosmolar nonketotic hyperglycemia (HCC) 02/10/2020   Past Surgical History:  Past Surgical History:  Procedure Laterality Date   NO PAST SURGERIES     ORIF ANKLE FRACTURE Left 09/13/2013   Procedure: OPEN REDUCTION INTERNAL FIXATION (ORIF) LEFT LATERAL MALLEOLUS ANKLE FRACTURE ;  Surgeon: Dannielle Huh, MD;  Location: [facility];  Service: Orthopedics;  Laterality: Left;   HPI:  Patient is a 48 y.o. male with PMH: DM-2, seizure disorder, HIV (untreated), obesity, polysubstance abuse, h/o cryptococcal disease. He presented to the [facility] ED on 09/26/23 with c/o of inability to swallow for the past three days with HA, hiccups and drooling. He was tachypneic in ED on RA and placed on oxygen via nasal cannula. DG neck soft tissue was negative for retropharyngeal soft tissue swelling or epiglottic enlargement, CXR portable suggestive of small airway infection/inflammation. CT angio/chest/PE showed Bilateral mid and lower lung airspace opacities concerning for multifocal pneumonia, less likely edema.    Assessment / Plan / Recommendation  Clinical Impression  Patient  presents with clinical s/s of dysphagia as per this bedside swallow evaluation but with SLP suspecting a primary esophageal based dysphagia as per patient's complaints and observed symptoms. Prior to PO intake, patient had frequent hiccups  which he reported have been going on for the past few days, along with globus sensation, difficulty swallowing and feeling of need to regurgitate most solid foods. 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 Patient spoke rapidly and speech was difficult to understand. Patient denies any prior need for GERD medications but that he did take some Tums last night. SLP assessed patient's swallow with sips of thin liquids (water) with swallow initiation appearing timely. No immediate coughing or throat clearing but patient did exhibit delayed throat clearing and brief period of increase in frequency of hiccups. SLP recommending to consider GI consult/esophageal assessment and consider trial of PPI. Recommend clear liquid PO's and will follow for toleration and need for further assessment. SLP Visit Diagnosis: Dysphagia, unspecified (R13.10)    Aspiration Risk  Mild aspiration risk    Diet Recommendation Other (Comment);Thin liquid (clear liquids)    Liquid Administration via: Cup;Straw Medication Administration: Other (Comment) (as tolerated) Supervision: Patient able to self feed Compensations: Slow rate;Small sips/bites Postural Changes: Seated upright at 90 degrees;Remain upright for at least 30 minutes after po intake  Other  Recommendations Recommended Consults: Consider GI evaluation;Consider esophageal assessment Oral Care Recommendations: Oral care BID;Patient independent with oral care    Recommendations for follow up therapy are one component of a multi-disciplinary discharge  planning process, led by the attending physician.  Recommendations may be updated based on patient status, additional functional criteria and insurance authorization.  Follow up Recommendations No SLP follow up      Assistance Recommended at Discharge    Functional Status Assessment Patient has had a recent decline in their functional status and demonstrates the ability to make significant improvements in function in a reasonable and predictable amount of time.  Frequency and Duration min 1 x/week  1 week       Prognosis Prognosis for improved oropharyngeal function: Good      Swallow Study   General Date of Onset: 09/26/23 HPI: Patient is a 48 y.o. male with PMH: DM-2, seizure disorder, HIV (untreated), obesity, polysubstance abuse, h/o cryptococcal disease. He presented to the [facility] ED on 09/26/23 with c/o of inability to swallow for the past three days with HA, hiccups and drooling. He was tachypneic in ED on RA and placed on oxygen via nasal cannula. DG neck soft tissue was negative for retropharyngeal soft tissue swelling or epiglottic enlargement, CXR portable suggestive of small airway infection/inflammation. CT angio/chest/PE showed Bilateral mid and lower lung airspace opacities concerning for multifocal pneumonia, less likely edema. Type of Study: Bedside Swallow Evaluation Previous Swallow Assessment: none found Diet Prior to this Study: NPO Temperature Spikes Noted: No Respiratory Status: Nasal cannula History of Recent Intubation: No Behavior/Cognition: Alert;Cooperative;Pleasant mood Oral Cavity Assessment: Within Functional Limits Oral Care Completed by SLP: No Oral Cavity - Dentition: Missing dentition;Adequate natural dentition Vision: Functional for self-feeding Self-Feeding Abilities: Able to feed self Patient Positioning: Upright in bed Baseline Vocal Quality: Normal Volitional Cough: Congested;Strong Volitional Swallow: Able to elicit    Oral/Motor/Sensory  Function Overall Oral Motor/Sensory Function: Mild impairment Facial ROM: Reduced right Facial Symmetry: Abnormal symmetry right Facial Strength: Reduced right Facial Sensation: Within Functional Limits Lingual ROM: Within Functional Limits Lingual Symmetry: Within Functional Limits Lingual Strength: Within Functional Limits Lingual Sensation: Within Functional Limits Velum: Within Functional Limits Mandible: Within Functional Limits   Ice Chips Ice chips: Not tested   Thin Liquid Thin Liquid: Impaired Presentation: Self Fed Pharyngeal  Phase Impairments: Throat Clearing - Delayed    Nectar Thick     Honey Thick     Puree Puree: Not tested   Solid     Solid: Not tested      Angela Nevin, MA, CCC-SLP Speech Therapy

## 2023-09-27 NOTE — ED Notes (Signed)
 ED TO INPATIENT HANDOFF REPORT  Name/Age/Gender Jeremiah Stevens 48 y.o. adult  Code Status    Code Status Orders  (From admission, onward)           Start     Ordered   09/26/23 2332  Full code  Continuous       Question:  By:  Answer:  Consent: discussion documented in EHR   09/26/23 2333           Code Status History     Date Active Date Inactive Code Status Order ID Comments User Context   08/25/2023 2116 09/02/2023 2120 Full Code [id number]  Marny Lowenstein ED   08/02/2022 2206 08/04/2022 2314 Full Code [id number]  Carollee Herter, DO ED   08/02/2022 2144 08/02/2022 2206 Full Code [id number]  Carollee Herter, DO ED   10/02/2020 1212 10/04/2020 2220 Full Code [id number]  Jae Dire, MD ED   02/10/2020 1701 02/12/2020 2147 Full Code [id number]  Gery Pray, MD Inpatient       Home/SNF/Other Home  Chief Complaint CAP (community acquired pneumonia) [J18.9]  Level of Care/Admitting Diagnosis ED Disposition     ED Disposition  Admit   Condition  --   Comment  Hospital Area: [facility] [[id number]]  Level of Care: Progressive [102]  Admit to Progressive based on following criteria: MULTISYSTEM THREATS such as stable sepsis, metabolic/electrolyte imbalance with or without encephalopathy that is responding to early treatment.  May admit patient to [facility] or [facility] if equivalent level of care is available:: No  Covid Evaluation: Asymptomatic - no recent exposure (last 10 days) testing not required  Diagnosis: CAP (community acquired pneumonia) [542706]  Admitting Physician: Therisa Doyne [[id number]]  Attending Physician: Therisa Doyne [[id number]]  Certification:: I certify this patient will need inpatient services for at least 2 midnights  Expected Medical Readiness: 09/30/2023          Medical History Past Medical History:  Diagnosis Date   Cancer (HCC)    seizures   Diabetes (HCC)    Diabetes mellitus without complication (HCC)     Heartburn    occasional; OTC as needed   Herpes genitalis in men    HIV (human immunodeficiency virus infection) (HCC)    HIV disease (HCC)    HTN (hypertension)    Hyperlipidemia    Hypertension    under control with med., has been on med. x 1 yr.   Lateral malleolar fracture 09/02/2013   left   Migraines    Tear of deltoid ligament of left ankle 09/02/2013   Type 2 diabetes mellitus with hyperosmolar nonketotic hyperglycemia (HCC) 02/10/2020    Allergies Allergies  Allergen Reactions   Penicillins Anaphylaxis   Peanut-Containing Drug Products Other (See Comments)    WALNUTS - SORES ON TONGUE   Sustiva [Efavirenz] Rash    IV Location/Drains/Wounds Patient Lines/Drains/Airways Status     Active Line/Drains/Airways     Name Placement date Placement time Site Days   Peripheral IV 09/26/23 20 G Left Antecubital 09/26/23  2037  Antecubital  1   Peripheral IV 09/26/23 20 G Right Antecubital 09/26/23  2135  Antecubital  1            Labs/Imaging Results for orders placed or performed during the hospital encounter of 09/26/23 (from the past 48 hours)  CBC with Differential     Status: Abnormal   Collection Time: 09/26/23  9:01 PM  Result Value Ref Range   WBC 6.9  4.0 - 10.5 K/uL   RBC 3.96 (L) 4.22 - 5.81 MIL/uL   Hemoglobin 11.7 (L) 13.0 - 17.0 g/dL   HCT 21.3 (L) 08.6 - 57.8 %   MCV 87.4 80.0 - 100.0 fL   MCH 29.5 26.0 - 34.0 pg   MCHC 33.8 30.0 - 36.0 g/dL   RDW 46.9 62.9 - 52.8 %   Platelets 194 150 - 400 K/uL   nRBC 0.0 0.0 - 0.2 %   Neutrophils Relative % 83 %   Neutro Abs 5.7 1.7 - 7.7 K/uL   Lymphocytes Relative 10 %   Lymphs Abs 0.7 0.7 - 4.0 K/uL   Monocytes Relative 7 %   Monocytes Absolute 0.5 0.1 - 1.0 K/uL   Eosinophils Relative 0 %   Eosinophils Absolute 0.0 0.0 - 0.5 K/uL   Basophils Relative 0 %   Basophils Absolute 0.0 0.0 - 0.1 K/uL   Immature Granulocytes 0 %   Abs Immature Granulocytes 0.02 0.00 - 0.07 K/uL    Comment: Performed at  [facility], [address] [place], [place] [address]  Comprehensive metabolic panel     Status: Abnormal   Collection Time: 09/26/23  9:01 PM  Result Value Ref Range   Sodium 132 (L) 135 - 145 mmol/L   Potassium 3.9 3.5 - 5.1 mmol/L   Chloride 96 (L) 98 - 111 mmol/L   CO2 28 22 - 32 mmol/L   Glucose, Bld 230 (H) 70 - 99 mg/dL    Comment: Glucose reference range applies only to samples taken after fasting for at least 8 hours.   BUN 18 6 - 20 mg/dL   Creatinine, Ser 4.01 (H) 0.61 - 1.24 mg/dL   Calcium 9.4 8.9 - 02.7 mg/dL   Total Protein 9.3 (H) 6.5 - 8.1 g/dL   Albumin 3.3 (L) 3.5 - 5.0 g/dL   AST 24 15 - 41 U/L   ALT 45 (H) 0 - 44 U/L   Alkaline Phosphatase 78 38 - 126 U/L   Total Bilirubin 0.7 0.0 - 1.2 mg/dL   GFR, Estimated >25 >36 mL/min    Comment: (NOTE) Calculated using the CKD-EPI Creatinine Equation (2021)    Anion gap 8 5 - 15    Comment: Performed at [facility], [address] [place], [place] [address]  Blood culture (routine x 2)     Status: None (Preliminary result)   Collection Time: 09/26/23  9:01 PM   Specimen: BLOOD  Result Value Ref Range   Specimen Description      BLOOD LEFT ANTECUBITAL Performed at [facility], [address] [place], [place] [address]    Special Requests      BOTTLES DRAWN AEROBIC AND ANAEROBIC Blood Culture adequate volume Performed at [facility], [address], [place], [place] [address]    Culture      NO GROWTH < 12 HOURS Performed at [facility], [address] [place], [place] [address]    Report Status PENDING   Blood culture (routine x 2)     Status: None (Preliminary result)   Collection Time: 09/26/23  9:01 PM   Specimen: BLOOD  Result Value Ref Range   Specimen Description      BLOOD RIGHT ANTECUBITAL Performed at [facility], [address] [place], [place] [address]    Special Requests       BOTTLES DRAWN AEROBIC AND ANAEROBIC Blood Culture adequate volume Performed at  [facility], [address] [place], [place] [address]    Culture      NO GROWTH < 12 HOURS Performed at [facility], [address], [place], [place] [address]    Report Status PENDING   Blood gas, venous (at [facility] and [facility])     Status: Abnormal   Collection Time: 09/26/23  9:01 PM  Result Value Ref Range   pH, Ven 7.37 7.25 - 7.43   pCO2, Ven 63 (H) 44 - 60 mmHg   pO2, Ven <31 (LL) 32 - 45 mmHg    Comment: CRITICAL RESULT CALLED TO, READ BACK BY AND VERIFIED WITH: R. JACKS, RN 2123 09/26/23 BY Jonnie Finner    Bicarbonate 36.4 (H) 20.0 - 28.0 mmol/L   Acid-Base Excess 9.0 (H) 0.0 - 2.0 mmol/L   O2 Saturation 42.2 %   Patient temperature 37.0     Comment: Performed at [facility], [address], [place], [place] [address]  Protime-INR     Status: None   Collection Time: 09/26/23  9:01 PM  Result Value Ref Range   Prothrombin Time 13.7 11.4 - 15.2 seconds   INR 1.0 0.8 - 1.2    Comment: (NOTE) INR goal varies based on device and disease states. Performed at [facility], [address] [place], [place] [address]   APTT     Status: None   Collection Time: 09/26/23  9:01 PM  Result Value Ref Range   aPTT 32 24 - 36 seconds    Comment: Performed at [facility], [address] [place], [place] [address]  Resp panel by RT-PCR (RSV, Flu A&B, Covid)     Status: None   Collection Time: 09/26/23  9:01 PM   Specimen: Nasal Swab  Result Value Ref Range   SARS Coronavirus 2 by RT PCR NEGATIVE NEGATIVE    Comment: (NOTE) SARS-CoV-2 target nucleic acids are NOT DETECTED.  The SARS-CoV-2 RNA is generally detectable in upper respiratory specimens during the acute phase of infection. The lowest concentration of SARS-CoV-2 viral copies this assay can detect is 138 copies/mL. A negative result does not preclude  SARS-Cov-2 infection and should not be used as the sole basis for treatment or other patient management decisions. A negative result may occur with  improper specimen collection/handling, submission of specimen other than nasopharyngeal swab, presence of viral mutation(s) within the areas targeted by this assay, and inadequate number of viral copies(<138 copies/mL). A negative result must be combined with clinical observations, patient history, and epidemiological information. The expected result is Negative.  Fact Sheet for Patients:  BloggerCourse.com  Fact Sheet for Healthcare Providers:  SeriousBroker.it  This test is no t yet approved or cleared by the [place] FDA and  has been authorized for detection and/or diagnosis of SARS-CoV-2 by FDA under an Emergency Use Authorization (EUA). This EUA will remain  in effect (meaning this test can be used) for the duration of the COVID-19 declaration under Section 564(b)(1) of the Act, 21 U.S.C.section 360bbb-3(b)(1), unless the authorization is terminated  or revoked sooner.       Influenza A by PCR NEGATIVE NEGATIVE   Influenza B by PCR NEGATIVE NEGATIVE    Comment: (NOTE) The Xpert Xpress SARS-CoV-2/FLU/RSV plus assay is intended as an aid in the diagnosis of influenza from Nasopharyngeal swab specimens and should not be used as a sole basis for treatment. Nasal washings and aspirates are unacceptable for Xpert Xpress SARS-CoV-2/FLU/RSV  testing.  Fact Sheet for Patients: BloggerCourse.com  Fact Sheet for Healthcare Providers: SeriousBroker.it  This test is not yet approved or cleared by the [place] FDA and has been authorized for detection and/or diagnosis of SARS-CoV-2 by FDA under an Emergency Use Authorization (EUA). This EUA will remain in effect (meaning this test can be used) for the duration of the COVID-19  declaration under Section 564(b)(1) of the Act, 21 U.S.C. section 360bbb-3(b)(1), unless the authorization is terminated or revoked.     Resp Syncytial Virus by PCR NEGATIVE NEGATIVE    Comment: (NOTE) Fact Sheet for Patients: BloggerCourse.com  Fact Sheet for Healthcare Providers: SeriousBroker.it  This test is not yet approved or cleared by the [place] FDA and has been authorized for detection and/or diagnosis of SARS-CoV-2 by FDA under an Emergency Use Authorization (EUA). This EUA will remain in effect (meaning this test can be used) for the duration of the COVID-19 declaration under Section 564(b)(1) of the Act, 21 U.S.C. section 360bbb-3(b)(1), unless the authorization is terminated or revoked.  Performed at [facility], [address], [place], [place] [address]   Lactate dehydrogenase     Status: None   Collection Time: 09/26/23  9:01 PM  Result Value Ref Range   LDH 154 98 - 192 U/L    Comment: Performed at [facility], [address], [place], [place] [address]  Strep pneumoniae urinary antigen     Status: None   Collection Time: 09/26/23  9:01 PM  Result Value Ref Range   Strep Pneumo Urinary Antigen NEGATIVE NEGATIVE    Comment:        Infection due to S. pneumoniae cannot be absolutely ruled out since the antigen present may be below the detection limit of the test. Performed at [facility], [address] [place], [place] [address]   I-stat chem 8, ED (not at [facility] or [facility])     Status: Abnormal   Collection Time: 09/26/23 10:14 PM  Result Value Ref Range   Sodium 135 135 - 145 mmol/L   Potassium 4.2 3.5 - 5.1 mmol/L   Chloride 98 98 - 111 mmol/L   BUN 17 6 - 20 mg/dL   Creatinine, Ser 8.41 0.61 - 1.24 mg/dL   Glucose, Bld 324 (H) 70 - 99 mg/dL    Comment: Glucose reference range applies only to samples taken after fasting for at least 8 hours.    Calcium, Ion 1.25 1.15 - 1.40 mmol/L   TCO2 31 22 - 32 mmol/L   Hemoglobin 11.6 (L) 13.0 - 17.0 g/dL   HCT 40.1 (L) 02.7 - 25.3 %  I-Stat CG4 Lactic Acid     Status: None   Collection Time: 09/26/23 11:33 PM  Result Value Ref Range   Lactic Acid, Venous 0.5 0.5 - 1.9 mmol/L  CBG monitoring, ED     Status: Abnormal   Collection Time: 09/27/23 12:44 AM  Result Value Ref Range   Glucose-Capillary 295 (H) 70 - 99 mg/dL    Comment: Glucose reference range applies only to samples taken after fasting for at least 8 hours.  I-Stat CG4 Lactic Acid     Status: None   Collection Time: 09/27/23  1:02 AM  Result Value Ref Range   Lactic Acid, Venous 0.8 0.5 - 1.9 mmol/L  Rapid urine drug screen (hospital performed)     Status: Abnormal   Collection Time: 09/27/23  1:15 AM  Result Value Ref Range  Opiates NONE DETECTED NONE DETECTED   Cocaine NONE DETECTED NONE DETECTED   Benzodiazepines NONE DETECTED NONE DETECTED   Amphetamines NONE DETECTED NONE DETECTED   Tetrahydrocannabinol POSITIVE (A) NONE DETECTED   Barbiturates NONE DETECTED NONE DETECTED    Comment: (NOTE) DRUG SCREEN FOR MEDICAL PURPOSES ONLY.  IF CONFIRMATION IS NEEDED FOR ANY PURPOSE, NOTIFY LAB WITHIN 5 DAYS.  LOWEST DETECTABLE LIMITS FOR URINE DRUG SCREEN Drug Class                     Cutoff (ng/mL) Amphetamine and metabolites    1000 Barbiturate and metabolites    200 Benzodiazepine                 200 Opiates and metabolites        300 Cocaine and metabolites        300 THC                            50 Performed at [facility], [address] [place], [place] [address]   Osmolality, urine     Status: None   Collection Time: 09/27/23  1:15 AM  Result Value Ref Range   Osmolality, Ur 331 300 - 900 mOsm/kg    Comment: Performed at [facility], [address], [place], [place] [address]  Creatinine, urine, random     Status: None   Collection Time: 09/27/23  1:15 AM  Result Value Ref  Range   Creatinine, Urine 61 mg/dL    Comment: Performed at [facility], [address] [place], [place] [address]  Sodium, urine, random     Status: None   Collection Time: 09/27/23  1:15 AM  Result Value Ref Range   Sodium, Ur 19 mmol/L    Comment: Performed at [facility], [address] [place], [place] [address]  CBG monitoring, ED     Status: Abnormal   Collection Time: 09/27/23  3:48 AM  Result Value Ref Range   Glucose-Capillary 272 (H) 70 - 99 mg/dL    Comment: Glucose reference range applies only to samples taken after fasting for at least 8 hours.  T-helper cells (CD4) count (not at [facility])     Status: Abnormal   Collection Time: 09/27/23  5:18 AM  Result Value Ref Range   CD4 T Cell Abs 36 (L) 400 - 1,790 /uL   CD4 % Helper T Cell 13 (L) 33 - 65 %    Comment: Performed at [facility], [address], [place], [place] [address]  Procalcitonin     Status: None   Collection Time: 09/27/23  5:18 AM  Result Value Ref Range   Procalcitonin 0.25 ng/mL    Comment:        Interpretation: PCT (Procalcitonin) <= 0.5 ng/mL: Systemic infection (sepsis) is not likely. Local bacterial infection is possible. (NOTE)       Sepsis PCT Algorithm           Lower Respiratory Tract                                      Infection PCT Algorithm    ----------------------------     ----------------------------         PCT < 0.25 ng/mL  PCT < 0.10 ng/mL          Strongly encourage             Strongly discourage   discontinuation of antibiotics    initiation of antibiotics    ----------------------------     -----------------------------       PCT 0.25 - 0.50 ng/mL            PCT 0.10 - 0.25 ng/mL               OR       >80% decrease in PCT            Discourage initiation of                                            antibiotics      Encourage discontinuation           of antibiotics    ----------------------------      -----------------------------         PCT >= 0.50 ng/mL              PCT 0.26 - 0.50 ng/mL               AND        <80% decrease in PCT             Encourage initiation of                                             antibiotics       Encourage continuation           of antibiotics    ----------------------------     -----------------------------        PCT >= 0.50 ng/mL                  PCT > 0.50 ng/mL               AND         increase in PCT                  Strongly encourage                                      initiation of antibiotics    Strongly encourage escalation           of antibiotics                                     -----------------------------                                           PCT <= 0.25 ng/mL                                                 OR                                        >  80% decrease in PCT                                      Discontinue / Do not initiate                                             antibiotics  Performed at [facility], [address], [place], [place] [address]   CK     Status: None   Collection Time: 09/27/23  5:18 AM  Result Value Ref Range   Total CK 67 49 - 397 U/L    Comment: Performed at [facility], [address], [place], [place] [address]  Osmolality     Status: Abnormal   Collection Time: 09/27/23  5:18 AM  Result Value Ref Range   Osmolality 305 (H) 275 - 295 mOsm/kg    Comment: REPEATED TO VERIFY Performed at [facility], [address], [place], [place] [address]   TSH     Status: Abnormal   Collection Time: 09/27/23  5:18 AM  Result Value Ref Range   TSH 0.210 (L) 0.350 - 4.500 uIU/mL    Comment: Performed by a 3rd Generation assay with a functional sensitivity of <=0.01 uIU/mL. Performed at [facility], [address], [place], [place] [address]   Magnesium     Status: None   Collection Time: 09/27/23  5:18 AM  Result Value  Ref Range   Magnesium 1.9 1.7 - 2.4 mg/dL    Comment: Performed at [facility], [address], [place], [place] [address]  Phosphorus     Status: None   Collection Time: 09/27/23  5:18 AM  Result Value Ref Range   Phosphorus 4.6 2.5 - 4.6 mg/dL    Comment: Performed at [facility], [address], [place], [place] [address]  Comprehensive metabolic panel     Status: Abnormal   Collection Time: 09/27/23  5:18 AM  Result Value Ref Range   Sodium 133 (L) 135 - 145 mmol/L   Potassium 4.4 3.5 - 5.1 mmol/L   Chloride 100 98 - 111 mmol/L   CO2 25 22 - 32 mmol/L   Glucose, Bld 249 (H) 70 - 99 mg/dL    Comment: Glucose reference range applies only to samples taken after fasting for at least 8 hours.   BUN 19 6 - 20 mg/dL   Creatinine, Ser 4.40 (H) 0.61 - 1.24 mg/dL   Calcium 8.8 (L) 8.9 - 10.3 mg/dL   Total Protein 9.2 (H) 6.5 - 8.1 g/dL   Albumin 3.1 (L) 3.5 - 5.0 g/dL   AST 26 15 - 41 U/L   ALT 42 0 - 44 U/L   Alkaline Phosphatase 76 38 - 126 U/L   Total Bilirubin 0.5 0.0 - 1.2 mg/dL   GFR, Estimated >34 >74 mL/min    Comment: (NOTE) Calculated using the CKD-EPI Creatinine Equation (2021)    Anion gap 8 5 - 15    Comment: Performed at [facility], [address], [place], [place] [address]  CBC     Status: Abnormal   Collection Time: 09/27/23  5:18 AM  Result Value Ref Range   WBC 6.6 4.0 - 10.5 K/uL   RBC  3.88 (L) 4.22 - 5.81 MIL/uL   Hemoglobin 11.4 (L) 13.0 - 17.0 g/dL   HCT 16.1 (L) 09.6 - 04.5 %   MCV 89.2 80.0 - 100.0 fL   MCH 29.4 26.0 - 34.0 pg   MCHC 32.9 30.0 - 36.0 g/dL   RDW 40.9 81.1 - 91.4 %   Platelets 193 150 - 400 K/uL   nRBC 0.0 0.0 - 0.2 %    Comment: Performed at [facility], [address], [place], [place] [address]  I-Stat CG4 Lactic Acid, ED     Status: None   Collection Time: 09/27/23  5:32 AM  Result Value Ref Range   Lactic Acid, Venous 1.4 0.5 - 1.9 mmol/L  MRSA Next  Gen by PCR, Nasal     Status: None   Collection Time: 09/27/23  8:10 AM   Specimen: Nasal Mucosa; Nasal Swab  Result Value Ref Range   MRSA by PCR Next Gen NOT DETECTED NOT DETECTED    Comment: (NOTE) The GeneXpert MRSA Assay (FDA approved for NASAL specimens only), is one component of a comprehensive MRSA colonization surveillance program. It is not intended to diagnose MRSA infection nor to guide or monitor treatment for MRSA infections. Test performance is not FDA approved in patients less than 69 years old. Performed at [facility], [address] [place], [place] [address]   CBG monitoring, ED     Status: Abnormal   Collection Time: 09/27/23  8:16 AM  Result Value Ref Range   Glucose-Capillary 184 (H) 70 - 99 mg/dL    Comment: Glucose reference range applies only to samples taken after fasting for at least 8 hours.  Expectorated Sputum Assessment w Gram Stain, Rflx to Resp Cult     Status: None   Collection Time: 09/27/23  9:04 AM   Specimen: Sputum  Result Value Ref Range   Specimen Description SPU    Special Requests Immunocompromised    Sputum evaluation      THIS SPECIMEN IS ACCEPTABLE FOR SPUTUM CULTURE Performed at [facility], [address] [place], [place] [address]    Report Status 09/27/2023 FINAL   Culture, Respiratory w Gram Stain     Status: None (Preliminary result)   Collection Time: 09/27/23  9:04 AM   Specimen: Sputum  Result Value Ref Range   Specimen Description      SPU Performed at [facility], [address] [place], [place] [address]    Special Requests      Immunocompromised Reflexed from [phone] Performed at [facility], [address] [place], [place] [address]    Gram Stain      ABUNDANT WBC PRESENT,BOTH PMN AND MONONUCLEAR MODERATE SQUAMOUS EPITHELIAL CELLS PRESENT MODERATE GRAM POSITIVE COCCI IN PAIRS RARE GRAM NEGATIVE RODS FEW GRAM POSITIVE  RODS Performed at [facility], [address] [place], [place] [address]    Culture PENDING    Report Status PENDING   CBG monitoring, ED     Status: Abnormal   Collection Time: 09/27/23 12:09 PM  Result Value Ref Range   Glucose-Capillary 231 (H) 70 - 99 mg/dL    Comment: Glucose reference range applies only to samples taken after fasting for at least 8 hours.  CBG monitoring, ED     Status: Abnormal   Collection Time: 09/27/23  4:05 PM  Result Value Ref Range   Glucose-Capillary 174 (H) 70 - 99 mg/dL    Comment:  Glucose reference range applies only to samples taken after fasting for at least 8 hours.  CBG monitoring, ED     Status: Abnormal   Collection Time: 09/27/23  6:29 PM  Result Value Ref Range   Glucose-Capillary 139 (H) 70 - 99 mg/dL    Comment: Glucose reference range applies only to samples taken after fasting for at least 8 hours.   CT Angio Chest Pulmonary Embolism (PE) W or WO Contrast Result Date: 09/27/2023 CLINICAL DATA:  Shortness of breath, hypoxia EXAM: CT ANGIOGRAPHY CHEST WITH CONTRAST TECHNIQUE: Multidetector CT imaging of the chest was performed using the standard protocol during bolus administration of intravenous contrast. Multiplanar CT image reconstructions and MIPs were obtained to evaluate the vascular anatomy. RADIATION DOSE REDUCTION: This exam was performed according to the departmental dose-optimization program which includes automated exposure control, adjustment of the mA and/or kV according to patient size and/or use of iterative reconstruction technique. CONTRAST:  OMNIPAQUE IOHEXOL 350 MG/ML SOLN COMPARISON:  08/28/2023 FINDINGS: Cardiovascular: Heart is normal size. Aorta is normal caliber. Scattered coronary artery calcifications. No filling defects in the pulmonary arteries to suggest pulmonary emboli. Mediastinum/Nodes: No mediastinal, hilar, or axillary adenopathy. Trachea and thyroid unremarkable. There is wall thickening involving the  distal esophagus suggesting esophagitis. Lungs/Pleura: Again noted is the 1.8 cm left lower lobe subpleural nodule medially on image 99, stable since prior study. Airspace disease noted in both lower lobes, right middle lobe and lingula concerning for pneumonia. No effusions. Upper Abdomen: Hepatic steatosis.  No acute findings. Musculoskeletal: Chest wall soft tissues are unremarkable. No acute bony abnormality. Review of the MIP images confirms the above findings. IMPRESSION: No evidence of pulmonary embolus. Bilateral mid and lower lung airspace opacities concerning for multifocal pneumonia, less likely edema. Hepatic steatosis. Scattered coronary artery disease. Electronically Signed   By: Charlett Nose M.D.   On: 09/27/2023 00:11   DG Chest Portable 1 View Result Date: 09/26/2023 CLINICAL DATA:  Shortness of breath, hypoxia EXAM: PORTABLE CHEST 1 VIEW COMPARISON:  Radiograph 08/25/2023 and CT chest 08/28/2023 FINDINGS: Stable cardiomegaly. Reticulonodular opacities in the lower lungs. Retrocardiac atelectasis or consolidation. No pleural effusion or pneumothorax. No displaced rib fractures. IMPRESSION: Reticulonodular opacities in the lower lungs, suggestive of small airway infection/inflammation. Electronically Signed   By: Minerva Fester M.D.   On: 09/26/2023 21:08   DG Neck Soft Tissue Result Date: 09/26/2023 CLINICAL DATA:  Dysphagia, drooling, concern for epiglottitis EXAM: NECK SOFT TISSUES - 1+ VIEW COMPARISON:  None Available. FINDINGS: There is no evidence of retropharyngeal soft tissue swelling or epiglottic enlargement. The cervical airway is unremarkable and no radio-opaque foreign body identified. IMPRESSION: Negative. Electronically Signed   By: Minerva Fester M.D.   On: 09/26/2023 21:06    Pending Labs Unresulted Labs (From admission, onward)     Start     Ordered   09/28/23 0500  CBC with Differential/Platelet  Tomorrow morning,   R        09/27/23 0828   09/28/23 0500   Comprehensive metabolic panel  Tomorrow morning,   R        09/27/23 0828   09/27/23 1352  Legionella Pneumophila Serogp 1 Ur Ag  Once,   R        09/27/23 1351   09/27/23 1352  Strep pneumoniae urinary antigen  Once,   R        09/27/23 1351   09/27/23 1349  Culture, Respiratory w Gram Stain  Once,   R  Question:  Patient immune status  Answer:  Immunocompromised   09/27/23 1351   09/26/23 2307  Legionella Pneumophila Serogp 1 Ur Ag  Once,   R        09/26/23 2307            Vitals/Pain Today's Vitals   09/27/23 1142 09/27/23 1415 09/27/23 1417 09/27/23 1830  BP: (!) 170/91 (!) 158/93  (!) 145/97  Pulse: 98 85  77  Resp: 16 19  20   Temp:  98.3 F (36.8 C) 98.3 F (36.8 C) 98.1 F (36.7 C)  TempSrc:  Oral  Oral  SpO2: (!) 87% 99%  96%  PainSc:        Isolation Precautions No active isolations  Medications Medications  insulin aspart (novoLOG) injection 0-9 Units (1 Units Subcutaneous Given 09/27/23 1849)  bictegravir-emtricitabine-tenofovir AF (BIKTARVY) 50-200-25 MG per tablet 1 tablet (1 tablet Oral Given 09/27/23 0951)  diltiazem (CARDIZEM CD) 24 hr capsule 360 mg (360 mg Oral Given 09/27/23 0950)  sulfamethoxazole-trimethoprim (BACTRIM DS) 800-160 MG per tablet 1 tablet (1 tablet Oral Given 09/27/23 0951)  0.9 %  sodium chloride infusion ( Intravenous Rate/Dose Verify 09/27/23 1419)  acetaminophen (TYLENOL) tablet 650 mg (650 mg Oral Given 09/27/23 0505)    Or  acetaminophen (TYLENOL) suppository 650 mg ( Rectal See Alternative 09/27/23 0505)  HYDROcodone-acetaminophen (NORCO/VICODIN) 5-325 MG per tablet 1-2 tablet (has no administration in time range)  ondansetron (ZOFRAN) tablet 4 mg (has no administration in time range)    Or  ondansetron (ZOFRAN) injection 4 mg (has no administration in time range)  polyethylene glycol (MIRALAX / GLYCOLAX) packet 17 g (has no administration in time range)  guaiFENesin (MUCINEX) 12 hr tablet 600 mg (600 mg Oral Given 09/27/23  0951)  levalbuterol (XOPENEX) nebulizer solution 0.63 mg (has no administration in time range)  ipratropium-albuterol (DUONEB) 0.5-2.5 (3) MG/3ML nebulizer solution 3 mL (3 mLs Nebulization Given 09/27/23 1849)  fluconazole (DIFLUCAN) tablet 400 mg (400 mg Oral Given 09/27/23 0951)  insulin glargine (LANTUS) injection 20 Units (20 Units Subcutaneous Given 09/27/23 0048)  cefTRIAXone (ROCEPHIN) 2 g in sodium chloride 0.9 % 100 mL IVPB (2 g Intravenous New Bag/Given 09/27/23 1849)  lactated ringers bolus 1,000 mL (0 mLs Intravenous Stopped 09/27/23 0054)  Racepinephrine HCl 2.25 % nebulizer solution 0.5 mL (0.5 mLs Nebulization Given 09/26/23 2037)  methylPREDNISolone sodium succinate (SOLU-MEDROL) 125 mg/2 mL injection 125 mg (125 mg Intravenous Given 09/26/23 2051)  ipratropium-albuterol (DUONEB) 0.5-2.5 (3) MG/3ML nebulizer solution 3 mL (3 mLs Nebulization Given 09/26/23 2159)  pantoprazole (PROTONIX) injection 40 mg (40 mg Intravenous Given 09/26/23 2204)  ceFEPIme (MAXIPIME) 2 g in sodium chloride 0.9 % 100 mL IVPB (0 g Intravenous Stopped 09/26/23 2357)  vancomycin (VANCOREADY) IVPB 2000 mg/400 mL (0 mg Intravenous Stopped 09/27/23 0305)  iohexol (OMNIPAQUE) 350 MG/ML injection 100 mL (100 mLs Intravenous Contrast Given 09/27/23 0001)  metoprolol tartrate (LOPRESSOR) injection 2.5 mg (2.5 mg Intravenous Given 09/27/23 0358)    Mobility walks

## 2023-09-27 NOTE — ED Notes (Signed)
 Check Pulse Oximetry while ambulating: Pt successfully ambulated down hall without assistance with a steady gait. Pt SpO2 remained greater than 93% on room air.

## 2023-09-27 NOTE — Evaluation (Signed)
 Physical Therapy Evaluation Patient Details Name: Jeremiah Stevens MRN: [id number] DOB: [birth year] Today's Date: 09/27/2023  History of Present Illness  Jeremiah Stevens is a 48 y.o. adult admitted with complaints if inability to swallow. PMH: diabetes type 2, seizure disorder, HIV untreated, obesity, polysubstance abuse  Clinical Impression  Pt admitted with above diagnosis.  Pt currently with functional limitations due to the deficits listed below (see PT Problem List). Pt will benefit from acute skilled PT to increase their independence and safety with mobility to allow discharge.     The patient is sitting up, on 10 L HFNC with an extension. SPo2 100%. Per RN, ok to ambukate on /ra and  check SPO2, Dropped to 85%. Replaced back on the [place]/10 L.  Patient's speech difficult to understand at times with the hiccups.  Patient also noted   with mild balance deficits with ambulation. Continue PT for mobility and monitor SPO2.      If plan is discharge home, recommend the following: Assist for transportation;Help with stairs or ramp for entrance   Can travel by private vehicle        Equipment Recommendations  (TBA)  Recommendations for Other Services       Functional Status Assessment Patient has had a recent decline in their functional status and demonstrates the ability to make significant improvements in function in a reasonable and predictable amount of time.     Precautions / Restrictions Precautions Precautions: Fall Restrictions Weight Bearing Restrictions Per Provider Order: No      Mobility  Bed Mobility               General bed mobility comments: seated on stretcher edge    Transfers Overall transfer level: Needs assistance   Transfers: Sit to/from Stand             General transfer comment: slight sway upon standing    Ambulation/Gait Ambulation/Gait assistance: Contact guard assist Gait Distance (Feet): 100 Feet Assistive device: 1  person hand held assist Gait Pattern/deviations: Step-to pattern, Drifts right/left Gait velocity: decr     General Gait Details: at times ,  steady assist/HHA  Stairs            Wheelchair Mobility     Tilt Bed    Modified Rankin (Stroke Patients Only)       Balance Overall balance assessment: Needs assistance Sitting-balance support: Feet supported, No upper extremity supported Sitting balance-Leahy Scale: Good     Standing balance support: During functional activity, No upper extremity supported Standing balance-Leahy Scale: Fair                               Pertinent Vitals/Pain Pain Assessment Pain Assessment: No/denies pain    Home Living Family/patient expects to be discharged to:: Private residence Living Arrangements: Other relatives Available Help at Discharge: Family;Available PRN/intermittently Type of Home: House Home Access: Stairs to enter   [employer] of Steps: 3   Home Layout: One level Home Equipment: None      Prior Function Prior Level of Function : Independent/Modified Independent                     Extremity/Trunk Assessment   Upper Extremity Assessment Upper Extremity Assessment: Overall WFL for tasks assessed    Lower Extremity Assessment Lower Extremity Assessment: Overall WFL for tasks assessed    Cervical / Trunk Assessment Cervical / Trunk Assessment: Normal  Communication   Communication Communication:  (has constant hiccups which affects clarity of speech.) Factors Affecting Communication: Reduced clarity of speech    Cognition Arousal: Alert Behavior During Therapy: Restless   PT - Cognitive impairments: Difficult to assess                       PT - Cognition Comments: Follows directions,  appears preoccupied on  the iphone Following commands: Intact       Cueing       General Comments      Exercises     Assessment/Plan    PT Assessment Patient needs  continued PT services  PT Problem List Decreased strength;Cardiopulmonary status limiting activity;Decreased activity tolerance;Decreased mobility       PT Treatment Interventions DME instruction;Therapeutic exercise;Gait training;Balance training;Functional mobility training;Therapeutic activities;Patient/family education    PT Goals (Current goals can be found in the Care Plan section)  Acute Rehab PT Goals Patient Stated Goal: agreed to ambulate PT Goal Formulation: With patient Time For Goal Achievement: 10/11/23 Potential to Achieve Goals: Good    Frequency Min 3X/week     Co-evaluation               AM-PAC PT "6 Clicks" Mobility  Outcome Measure Help needed turning from your back to your side while in a flat bed without using bedrails?: None Help needed moving from lying on your back to sitting on the side of a flat bed without using bedrails?: None Help needed moving to and from a bed to a chair (including a wheelchair)?: None Help needed standing up from a chair using your arms (e.g., wheelchair or bedside chair)?: None Help needed to walk in hospital room?: A Little Help needed climbing 3-5 steps with a railing? : A Lot 6 Click Score: 21    End of Session   Activity Tolerance: Patient tolerated treatment well Patient left: in bed;with nursing/sitter in room (seated) Nurse Communication: Mobility status PT Visit Diagnosis: Unsteadiness on feet (R26.81)    Time: 7829-5621 PT Time Calculation (min) (ACUTE ONLY): 17 min   Charges:   PT Evaluation $PT Eval Low Complexity: 1 Low   PT General Charges $$ ACUTE PT VISIT: 1 Visit         Jeremiah Stevens PT Acute Rehabilitation Services Office [phone] Weekend pager-[phone]   Jeremiah Stevens 09/27/2023, 11:18 AM

## 2023-09-27 NOTE — ED Notes (Signed)
 Sister called wanting update gave her the room number patient will go to and let her know the provider put in a diet order for the patient.

## 2023-09-27 NOTE — Progress Notes (Signed)
 PT Cancellation Note  Patient Details Name: Jeremiah Jeremiah MRN: [id number] DOB: 26-Jul-1975   Cancelled Treatment:    Reason Eval/Treat Not Completed: Other (comment) MD with patient. Will check back . Blanchard Kelch PT Acute Rehabilitation Services Office [phone]    Rada Hay 09/27/2023, 10:29 AM

## 2023-09-27 NOTE — ED Notes (Signed)
 Patient self removed IV fluids and monitor devices. Patient placed back on monitor and fluids at this time

## 2023-09-27 NOTE — ED Notes (Signed)
 Patient self removed IV fluids and monitor devices at this time. Patient placed back on monitor and hooked back up to fluids

## 2023-09-28 ENCOUNTER — Inpatient Hospital Stay (HOSPITAL_COMMUNITY)

## 2023-09-28 ENCOUNTER — Encounter (HOSPITAL_COMMUNITY): Payer: Self-pay | Admitting: Internal Medicine

## 2023-09-28 DIAGNOSIS — J189 Pneumonia, unspecified organism: Secondary | ICD-10-CM | POA: Diagnosis not present

## 2023-09-28 DIAGNOSIS — B20 Human immunodeficiency virus [HIV] disease: Secondary | ICD-10-CM | POA: Diagnosis not present

## 2023-09-28 DIAGNOSIS — R471 Dysarthria and anarthria: Secondary | ICD-10-CM | POA: Diagnosis not present

## 2023-09-28 DIAGNOSIS — R270 Ataxia, unspecified: Secondary | ICD-10-CM

## 2023-09-28 DIAGNOSIS — J9601 Acute respiratory failure with hypoxia: Secondary | ICD-10-CM | POA: Diagnosis not present

## 2023-09-28 DIAGNOSIS — B451 Cerebral cryptococcosis: Secondary | ICD-10-CM | POA: Diagnosis not present

## 2023-09-28 DIAGNOSIS — R531 Weakness: Secondary | ICD-10-CM

## 2023-09-28 DIAGNOSIS — R29810 Facial weakness: Secondary | ICD-10-CM | POA: Diagnosis not present

## 2023-09-28 DIAGNOSIS — R1310 Dysphagia, unspecified: Secondary | ICD-10-CM | POA: Diagnosis not present

## 2023-09-28 LAB — CBC WITH DIFFERENTIAL/PLATELET
Abs Immature Granulocytes: 0.04 10*3/uL (ref 0.00–0.07)
Basophils Absolute: 0 10*3/uL (ref 0.0–0.1)
Basophils Relative: 0 %
Eosinophils Absolute: 0 10*3/uL (ref 0.0–0.5)
Eosinophils Relative: 0 %
HCT: 35.2 % — ABNORMAL LOW (ref 39.0–52.0)
Hemoglobin: 11.4 g/dL — ABNORMAL LOW (ref 13.0–17.0)
Immature Granulocytes: 1 %
Lymphocytes Relative: 14 %
Lymphs Abs: 0.9 10*3/uL (ref 0.7–4.0)
MCH: 29.5 pg (ref 26.0–34.0)
MCHC: 32.4 g/dL (ref 30.0–36.0)
MCV: 91 fL (ref 80.0–100.0)
Monocytes Absolute: 0.6 10*3/uL (ref 0.1–1.0)
Monocytes Relative: 10 %
Neutro Abs: 4.5 10*3/uL (ref 1.7–7.7)
Neutrophils Relative %: 75 %
Platelets: 221 10*3/uL (ref 150–400)
RBC: 3.87 MIL/uL — ABNORMAL LOW (ref 4.22–5.81)
RDW: 15.4 % (ref 11.5–15.5)
Smear Review: NORMAL
WBC: 6 10*3/uL (ref 4.0–10.5)
nRBC: 0 % (ref 0.0–0.2)

## 2023-09-28 LAB — COMPREHENSIVE METABOLIC PANEL
ALT: 39 U/L (ref 0–44)
AST: 33 U/L (ref 15–41)
Albumin: 3.5 g/dL (ref 3.5–5.0)
Alkaline Phosphatase: 71 U/L (ref 38–126)
Anion gap: 10 (ref 5–15)
BUN: 32 mg/dL — ABNORMAL HIGH (ref 6–20)
CO2: 25 mmol/L (ref 22–32)
Calcium: 9.4 mg/dL (ref 8.9–10.3)
Chloride: 98 mmol/L (ref 98–111)
Creatinine, Ser: 1.24 mg/dL (ref 0.61–1.24)
GFR, Estimated: >60 mL/min (ref >60)
Glucose, Bld: 174 mg/dL — ABNORMAL HIGH (ref 70–99)
Potassium: 4.9 mmol/L (ref 3.5–5.1)
Sodium: 133 mmol/L — ABNORMAL LOW (ref 135–145)
Total Bilirubin: 1.1 mg/dL (ref 0.0–1.2)
Total Protein: 9.6 g/dL — ABNORMAL HIGH (ref 6.5–8.1)

## 2023-09-28 LAB — GLUCOSE, CAPILLARY
Glucose-Capillary: 164 mg/dL — ABNORMAL HIGH (ref 70–99)
Glucose-Capillary: 143 mg/dL — ABNORMAL HIGH (ref 70–99)
Glucose-Capillary: 170 mg/dL — ABNORMAL HIGH (ref 70–99)
Glucose-Capillary: 161 mg/dL — ABNORMAL HIGH (ref 70–99)
Glucose-Capillary: 151 mg/dL — ABNORMAL HIGH (ref 70–99)

## 2023-09-28 LAB — STREP PNEUMONIAE URINARY ANTIGEN: Strep Pneumo Urinary Antigen: NEGATIVE

## 2023-09-28 MED ORDER — FOOD THICKENER (SIMPLYTHICK)
1.0000 | ORAL | Status: DC | PRN
  Administered 2023-09-28: 1 via ORAL

## 2023-09-28 MED ORDER — LORAZEPAM 2 MG/ML IJ SOLN
0.5000 mg | Freq: Once | INTRAMUSCULAR | Status: AC
  Administered 2023-09-29: 0.5 mg via INTRAVENOUS
  Filled 2023-09-28: qty 1

## 2023-09-28 MED ORDER — FLUCONAZOLE IN SODIUM CHLORIDE 400-0.9 MG/200ML-% IV SOLN
400.0000 mg | Freq: Every day | INTRAVENOUS | Status: DC
  Administered 2023-09-28 – 2023-10-09 (×12): 400 mg via INTRAVENOUS
  Filled 2023-09-28 (×13): qty 200

## 2023-09-28 MED ORDER — STROKE: EARLY STAGES OF RECOVERY BOOK
Freq: Once | Status: AC
  Filled 2023-09-28: qty 1

## 2023-09-28 MED ORDER — ASPIRIN 81 MG PO TBEC: 81.0000 mg | DELAYED_RELEASE_TABLET | Freq: Every day | ORAL | Status: DC

## 2023-09-28 NOTE — Progress Notes (Signed)
 Speech Language Pathology Treatment: Dysphagia  Patient Details Name: Jeremiah Stevens MRN: [id number] DOB: Jun 11, 1976 Today's Date: 09/28/2023 Time: 5784-6962 SLP Time Calculation (min) (ACUTE ONLY): 13 min  Assessment / Plan / Recommendation Clinical Impression  Patient seen to review findings of MBS, reviewed fluoroscopy loops as well as compensation strategies. Patient sitting up in bed continues with snoring type respirations but reports that this is behavioral due to frustration. She endorses only thing that was helpful and x-ray to improve pharyngeal clearance was multiple swallows. SlP reviewed MBS with her explaining that her retention mixes with secretions and is subsequently aspirated. Pt reports awareness that MD is concerned about potential CVA - and MRI is pending. SLP educated her to aspiration and malnutrition risk with her current dysphagia but discussed option of full liquid nectar thick, single ice chips and allow thin water between meals. Encouraged her to continue to conduct multiple swallows and cough with every bolus. Pt expresses desire to eat regardless of aspiration including risk for pulmonary compromise, airway closure, and nutrition concerns. Will write for full liquids- nectar with strict precautions, allow single ice chips any time and water between meals after mouth care. Pt agreeable but when given Ensure, she stated "That's not going to be enough to fill me up". She states she is a "big girl and she eats". Note MRI brain pending. Will follow up next date - implored to pt to maintain strength of cough and expectoration.     HPI HPI: Patient is a 48 y.o. male with PMH: DM-2, seizure disorder, HIV (untreated), obesity, polysubstance abuse, h/o cryptococcal disease. He [facility] ED on 09/26/23 with c/o of inability to swallow for the past three days with HA, hiccups and drooling. He was tachypneic in ED on RA and placed on oxygen via nasal cannula. DG neck soft  tissue was negative for retropharyngeal soft tissue swelling or epiglottic enlargement, CXR portable suggestive of small airway infection/inflammation. CT angio/chest/PE showed Bilateral mid and lower lung airspace opacities concerning for multifocal pneumonia, less likely edema.  Pt with dysarthria and reports new SUDDEN onset of dysphagia x1 week ago and cough x4 days.  MBS indicated.      SLP Plan  Continue with current plan of care      Recommendations for follow up therapy are one component of a multi-disciplinary discharge planning process, led by the attending physician.  Recommendations may be updated based on patient status, additional functional criteria and insurance authorization.    Recommendations  Diet recommendations: Nectar-thick liquid (full liquids) Liquids provided via: Cup Medication Administration: Other (Comment) (crushed with nectar) Compensations: Slow rate;Small sips/bites;Multiple dry swallows after each bite/sip Postural Changes and/or Swallow Maneuvers: Seated upright 90 degrees;Upright 30-60 min after meal                  Oral care BID     Dysphagia, oropharyngeal phase (R13.12)     Continue with current plan of care    Jeremiah Infante, MS [facility] SLP Acute Rehab Services Office [phone]  Jeremiah Stevens  09/28/2023, 5:59 PM

## 2023-09-28 NOTE — Progress Notes (Addendum)
 Initial Nutrition Assessment  INTERVENTION:   -If patient deemed unsafe for PO, recommend placement of Cortrak tube  If diet is advanced: -Ensure MAX Protein po BID, each supplement provides 150 kcal and 30 grams of protein  -Multivitamin with minerals daily  -Placed "Carbohydrate Counting" handout in AVS  NUTRITION DIAGNOSIS:   Increased nutrient needs related to chronic illness as evidenced by estimated needs.  GOAL:   Patient will meet greater than or equal to 90% of their needs  MONITOR:   PO intake, Supplement acceptance  REASON FOR ASSESSMENT:   Consult Assessment of nutrition requirement/status  ASSESSMENT:   48 y.o. adult with medical history significant of diabetes type 2, seizure disorder, HIV untreated, obesity, polysubstance abuse .  Patient is admitted for pneumonia with hypoxia in the setting of HIV.  Patient not in room at time of visit. Will attempt to speak with patient at a later time. Per chart review, pt with history of needing nutrition support as recently as this past February d/t AMS. Pt with history polysubstance abuse.  Pt currently on carb modified diet but nursing reporting pt with coughing episodes while eating. Pt also having dysphagia PTA. SLP to eval. Will await results from MBS, recommend protein shakes to supplement intakes. Likely would benefit from daily MVI as well.   UBW ~260-270 lbs Current weight: 255 lbs  Medications reviewed.  Labs reviewed: CBGs: 124-231 Low Na   NUTRITION - FOCUSED PHYSICAL EXAM:  Unable to complete at time of visit.  Diet Order:   Diet Order             Diet Carb Modified Fluid consistency: Thin; Room service appropriate? Yes  Diet effective now                   EDUCATION NEEDS:   Education needs have been addressed  Skin:  Skin Assessment: Reviewed RN Assessment  Last BM:  3/17  Height:   Ht Readings from Last 1 Encounters:  09/27/23 6' (1.829 m)    Weight:   Wt Readings from  Last 1 Encounters:  09/27/23 115.8 kg    BMI:  Body mass index is 34.62 kg/m.  Estimated Nutritional Needs:   Kcal:  2400-2600  Protein:  110-120g  Fluid:  2.4L/day   Tilda Franco, MS, RD, LDN Inpatient Clinical Dietitian Contact via Secure chat

## 2023-09-28 NOTE — Plan of Care (Signed)
 Problem: Education: Goal: Ability to describe self-care measures that may prevent or decrease complications (Diabetes Survival Skills Education) will improve 09/28/2023 0025 by Marina Goodell, RN Outcome: Progressing 09/28/2023 0024 by Marina Goodell, RN Outcome: Progressing Goal: Individualized Educational Video(s) 09/28/2023 0025 by Marina Goodell, RN Outcome: Progressing 09/28/2023 0024 by Marina Goodell, RN Outcome: Progressing   Problem: Coping: Goal: Ability to adjust to condition or change in health will improve 09/28/2023 0025 by Marina Goodell, RN Outcome: Progressing 09/28/2023 0024 by Marina Goodell, RN Outcome: Progressing   Problem: Fluid Volume: Goal: Ability to maintain a balanced intake and output will improve 09/28/2023 0025 by Marina Goodell, RN Outcome: Progressing 09/28/2023 0024 by Marina Goodell, RN Outcome: Progressing   Problem: Health Behavior/Discharge Planning: Goal: Ability to identify and utilize available resources and services will improve 09/28/2023 0025 by Marina Goodell, RN Outcome: Progressing 09/28/2023 0024 by Marina Goodell, RN Outcome: Progressing Goal: Ability to manage health-related needs will improve 09/28/2023 0025 by Marina Goodell, RN Outcome: Progressing 09/28/2023 0024 by Marina Goodell, RN Outcome: Progressing   Problem: Metabolic: Goal: Ability to maintain appropriate glucose levels will improve 09/28/2023 0025 by Marina Goodell, RN Outcome: Progressing 09/28/2023 0024 by Marina Goodell, RN Outcome: Progressing   Problem: Nutritional: Goal: Maintenance of adequate nutrition will improve 09/28/2023 0025 by Marina Goodell, RN Outcome: Progressing 09/28/2023 0024 by Marina Goodell, RN Outcome: Progressing Goal: Progress toward achieving an optimal weight will improve 09/28/2023 0025 by Marina Goodell, RN Outcome: Progressing 09/28/2023 0024 by Marina Goodell, RN Outcome: Progressing   Problem: Skin Integrity: Goal: Risk for impaired skin  integrity will decrease 09/28/2023 0025 by Marina Goodell, RN Outcome: Progressing 09/28/2023 0024 by Marina Goodell, RN Outcome: Progressing   Problem: Tissue Perfusion: Goal: Adequacy of tissue perfusion will improve 09/28/2023 0025 by Marina Goodell, RN Outcome: Progressing 09/28/2023 0024 by Marina Goodell, RN Outcome: Progressing   Problem: Education: Goal: Knowledge of General Education information will improve Description: Including pain rating scale, medication(s)/side effects and non-pharmacologic comfort measures 09/28/2023 0025 by Marina Goodell, RN Outcome: Progressing 09/28/2023 0024 by Marina Goodell, RN Outcome: Progressing   Problem: Health Behavior/Discharge Planning: Goal: Ability to manage health-related needs will improve 09/28/2023 0025 by Marina Goodell, RN Outcome: Progressing 09/28/2023 0024 by Marina Goodell, RN Outcome: Progressing   Problem: Clinical Measurements: Goal: Ability to maintain clinical measurements within normal limits will improve 09/28/2023 0025 by Marina Goodell, RN Outcome: Progressing 09/28/2023 0024 by Marina Goodell, RN Outcome: Progressing Goal: Will remain free from infection 09/28/2023 0025 by Marina Goodell, RN Outcome: Progressing 09/28/2023 0024 by Marina Goodell, RN Outcome: Progressing Goal: Diagnostic test results will improve 09/28/2023 0025 by Marina Goodell, RN Outcome: Progressing 09/28/2023 0024 by Marina Goodell, RN Outcome: Progressing Goal: Respiratory complications will improve 09/28/2023 0025 by Marina Goodell, RN Outcome: Progressing 09/28/2023 0024 by Marina Goodell, RN Outcome: Progressing Goal: Cardiovascular complication will be avoided 09/28/2023 0025 by Marina Goodell, RN Outcome: Progressing 09/28/2023 0024 by Marina Goodell, RN Outcome: Progressing   Problem: Activity: Goal: Risk for activity intolerance will decrease 09/28/2023 0025 by Marina Goodell, RN Outcome: Progressing 09/28/2023 0024 by Marina Goodell, RN Outcome:  Progressing   Problem: Nutrition: Goal: Adequate nutrition will be maintained 09/28/2023 0025 by Marina Goodell, RN Outcome: Progressing 09/28/2023 0024 by Marina Goodell, RN Outcome: Progressing   Problem: Coping: Goal: Level of anxiety will decrease 09/28/2023 0025 by Marina Goodell, RN Outcome: Progressing 09/28/2023 0024 by Marina Goodell, RN Outcome: Progressing   Problem: Elimination: Goal: Will not experience complications related to bowel motility 09/28/2023 0025  by Marina Goodell, RN Outcome: Progressing 09/28/2023 0024 by Marina Goodell, RN Outcome: Progressing Goal: Will not experience complications related to urinary retention 09/28/2023 0025 by Marina Goodell, RN Outcome: Progressing 09/28/2023 0024 by Marina Goodell, RN Outcome: Progressing   Problem: Pain Managment: Goal: General experience of comfort will improve and/or be controlled 09/28/2023 0025 by Marina Goodell, RN Outcome: Progressing 09/28/2023 0024 by Marina Goodell, RN Outcome: Progressing   Problem: Safety: Goal: Ability to remain free from injury will improve 09/28/2023 0025 by Marina Goodell, RN Outcome: Progressing 09/28/2023 0024 by Marina Goodell, RN Outcome: Progressing   Problem: Skin Integrity: Goal: Risk for impaired skin integrity will decrease 09/28/2023 0025 by Marina Goodell, RN Outcome: Progressing 09/28/2023 0024 by Marina Goodell, RN Outcome: Progressing   Problem: Activity: Goal: Ability to tolerate increased activity will improve 09/28/2023 0025 by Marina Goodell, RN Outcome: Progressing 09/28/2023 0024 by Marina Goodell, RN Outcome: Progressing   Problem: Clinical Measurements: Goal: Ability to maintain a body temperature in the normal range will improve 09/28/2023 0025 by Marina Goodell, RN Outcome: Progressing 09/28/2023 0024 by Marina Goodell, RN Outcome: Progressing   Problem: Respiratory: Goal: Ability to maintain adequate ventilation will improve 09/28/2023 0025 by Marina Goodell,  RN Outcome: Progressing 09/28/2023 0024 by Marina Goodell, RN Outcome: Progressing Goal: Ability to maintain a clear airway will improve 09/28/2023 0025 by Marina Goodell, RN Outcome: Progressing 09/28/2023 0024 by Marina Goodell, RN Outcome: Progressing

## 2023-09-28 NOTE — Progress Notes (Signed)
 Pt siting up at 90 degrees eating breakfast. Coughing large amount of pieces of food. Pt told that RN would prefer him not eat since he was having a difficult time eating and catching his breath. Pt stated while coughing what you just expect me to be hungry.  RN got suction and placed at bedside and paged MD.  Orders received.  1610- Rt at bedside giving treatment.

## 2023-09-28 NOTE — Progress Notes (Signed)
 Speech Language Pathology Treatment: Dysphagia  Patient Details Name: Jeremiah Stevens MRN: [id number] DOB: 1976-03-09 Today's Date: 09/28/2023 Time: 1219-1229 SLP Time Calculation (min) (ACUTE ONLY): 10 min  Assessment / Plan / Recommendation Clinical Impression  Pt seen to address swallow function - at RN reports patient overtly coughing and expectorating sausage as well as medications. Upon entrance to room patient appears dysarthric and is hypernasal concerning for velar incompetence. He reports sudden onset of difficulties with swallowing approximately 1 week ago and cough starting 4 days ago. Patient reports sensation of food sticking in the left side of his throat pointing to mid area and states that he is coughing immediately post swallow. Upon observation of mouth patient appeared with viscous secretions that were blue coated, suspected due to medication administration earlier.   Observed him with consumption of single ice chip and small bite of applesauce. Immediately after swallow of ice patient began coughing and needed cues to cough and expectorate. Fortunately he is able to cough and expel secretions and able to use oral suction independently. In further discussion with patient and directly asking if he has nasal loss of liquids prior to admission he confirmed that this does occur contributing to concern for potential velar and or pharyngeal deficits.   MBS ordered and will attempt to expedite this today due to significant concerns for dysphagia. Advised patient to only consume single ice chips pending test.   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    HPI HPI: Patient is a 48 y.o. male with PMH: DM-2, seizure disorder, HIV (untreated), obesity, polysubstance abuse, h/o cryptococcal disease. He presented to the [facility] ED on 09/26/23 with c/o of inability to swallow for the past three days with HA, hiccups and drooling. He was tachypneic in ED on RA and placed on oxygen  via nasal cannula. DG neck soft tissue was negative for retropharyngeal soft tissue swelling or epiglottic enlargement, CXR portable suggestive of small airway infection/inflammation. CT angio/chest/PE showed Bilateral mid and lower lung airspace opacities concerning for multifocal pneumonia, less likely edema.      SLP Plan  MBS      Recommendations for follow up therapy are one component of a multi-disciplinary discharge planning process, led by the attending physician.  Recommendations may be updated based on patient status, additional functional criteria and insurance authorization.    Recommendations      Npo x ice pending MBS               Oral care BID     Dysphagia, unspecified (R13.10)     MBS  Rolena Infante, MS [facility] SLP Acute Rehab Services Office [phone]    Jeremiah Stevens  09/28/2023, 12:34 PM

## 2023-09-28 NOTE — Progress Notes (Signed)
 MBS completed, full report to follow.  Please see image below.  Pt appears with unilateral bulging on the right - ? Source.         Rolena Infante, MS [facility] SLP Acute [employer] [phone]

## 2023-09-28 NOTE — Progress Notes (Signed)
 [facility]   Reason for visit: Follow up on hypoxemia  Interval History: WBC is stable at 6.0, max of 100.0.  Day 2 of ceftriaxone.    Physical Exam: Constitutional:  Vitals:   09/28/23 1115 09/28/23 1157  BP: (!) 162/101   Pulse:    Resp:    Temp:    SpO2:  93%   patient appears in NAD  Review of Systems: Constitutional: negative for fevers and chills  Lab Results  Component Value Date   WBC 6.0 09/28/2023   HGB 11.4 (L) 09/28/2023   HCT 35.2 (L) 09/28/2023   MCV 91.0 09/28/2023   PLT 221 09/28/2023    Lab Results  Component Value Date   CREATININE 1.24 09/28/2023   BUN 32 (H) 09/28/2023   NA 133 (L) 09/28/2023   K 4.9 09/28/2023   CL 98 09/28/2023   CO2 25 09/28/2023    Lab Results  Component Value Date   ALT 39 09/28/2023   AST 33 09/28/2023   ALKPHOS 71 09/28/2023     Microbiology: Recent Results (from the past 240 hours)  Blood culture (routine x 2)     Status: None (Preliminary result)   Collection Time: 09/26/23  9:01 PM   Specimen: BLOOD  Result Value Ref Range Status   Specimen Description   Final    BLOOD LEFT ANTECUBITAL Performed at [facility], [address], [place], [place] [address]    Special Requests   Final    BOTTLES DRAWN AEROBIC AND ANAEROBIC Blood Culture adequate volume Performed at [facility], [address], [place], [place] [address]    Culture   Final    NO GROWTH 2 DAYS Performed at [facility], [address] [place], [place] [address]    Report Status PENDING  Incomplete  Blood culture (routine x 2)     Status: None (Preliminary result)   Collection Time: 09/26/23  9:01 PM   Specimen: BLOOD  Result Value Ref Range Status   Specimen Description   Final    BLOOD RIGHT ANTECUBITAL Performed at [facility], [address], [place], [place] [address]    Special Requests   Final    BOTTLES DRAWN AEROBIC AND ANAEROBIC  Blood Culture adequate volume Performed at [facility], [address] [place], [place] [address]    Culture   Final    NO GROWTH 2 DAYS Performed at [facility], [address], [place], [place] [address]    Report Status PENDING  Incomplete  Resp panel by RT-PCR (RSV, Flu A&B, Covid)     Status: None   Collection Time: 09/26/23  9:01 PM   Specimen: Nasal Swab  Result Value Ref Range Status   SARS Coronavirus 2 by RT PCR NEGATIVE NEGATIVE Final    Comment: (NOTE) SARS-CoV-2 target nucleic acids are NOT DETECTED.  The SARS-CoV-2 RNA is generally detectable in upper respiratory specimens during the acute phase of infection. The lowest concentration of SARS-CoV-2 viral copies this assay can detect is 138 copies/mL. A negative result does not preclude SARS-Cov-2 infection and should not be used as the sole basis for treatment or other patient management decisions. A negative result may occur with  improper specimen collection/handling, submission of specimen other than nasopharyngeal swab, presence of viral mutation(s) within the areas targeted by this assay, and inadequate number of viral copies(<138 copies/mL). A negative result must be combined  with clinical observations, patient history, and epidemiological information. The expected result is Negative.  Fact Sheet for Patients:  BloggerCourse.com  Fact Sheet for Healthcare Providers:  SeriousBroker.it  This test is no t yet approved or cleared by the [place] FDA and  has been authorized for detection and/or diagnosis of SARS-CoV-2 by FDA under an Emergency Use Authorization (EUA). This EUA will remain  in effect (meaning this test can be used) for the duration of the COVID-19 declaration under Section 564(b)(1) of the Act, 21 U.S.C.section 360bbb-3(b)(1), unless the authorization is terminated  or revoked sooner.       Influenza A by PCR  NEGATIVE NEGATIVE Final   Influenza B by PCR NEGATIVE NEGATIVE Final    Comment: (NOTE) The Xpert Xpress SARS-CoV-2/FLU/RSV plus assay is intended as an aid in the diagnosis of influenza from Nasopharyngeal swab specimens and should not be used as a sole basis for treatment. Nasal washings and aspirates are unacceptable for Xpert Xpress SARS-CoV-2/FLU/RSV testing.  Fact Sheet for Patients: BloggerCourse.com  Fact Sheet for Healthcare Providers: SeriousBroker.it  This test is not yet approved or cleared by the [place] FDA and has been authorized for detection and/or diagnosis of SARS-CoV-2 by FDA under an Emergency Use Authorization (EUA). This EUA will remain in effect (meaning this test can be used) for the duration of the COVID-19 declaration under Section 564(b)(1) of the Act, 21 U.S.C. section 360bbb-3(b)(1), unless the authorization is terminated or revoked.     Resp Syncytial Virus by PCR NEGATIVE NEGATIVE Final    Comment: (NOTE) Fact Sheet for Patients: BloggerCourse.com  Fact Sheet for Healthcare Providers: SeriousBroker.it  This test is not yet approved or cleared by the [place] FDA and has been authorized for detection and/or diagnosis of SARS-CoV-2 by FDA under an Emergency Use Authorization (EUA). This EUA will remain in effect (meaning this test can be used) for the duration of the COVID-19 declaration under Section 564(b)(1) of the Act, 21 U.S.C. section 360bbb-3(b)(1), unless the authorization is terminated or revoked.  Performed at [facility], [address], [place], [place] [address]   MRSA Next Gen by PCR, Nasal     Status: None   Collection Time: 09/27/23  8:10 AM   Specimen: Nasal Mucosa; Nasal Swab  Result Value Ref Range Status   MRSA by PCR Next Gen NOT DETECTED NOT DETECTED Final    Comment: (NOTE) The GeneXpert  MRSA Assay (FDA approved for NASAL specimens only), is one component of a comprehensive MRSA colonization surveillance program. It is not intended to diagnose MRSA infection nor to guide or monitor treatment for MRSA infections. Test performance is not FDA approved in patients less than 16 years old. Performed at [facility], [address], [place], [place] [address]   Expectorated Sputum Assessment w Gram Stain, Rflx to Resp Cult     Status: None   Collection Time: 09/27/23  9:04 AM   Specimen: Sputum  Result Value Ref Range Status   Specimen Description SPU  Final   Special Requests Immunocompromised  Final   Sputum evaluation   Final    THIS SPECIMEN IS ACCEPTABLE FOR SPUTUM CULTURE Performed at [facility], [address], [place], [place] [address]    Report Status 09/27/2023 FINAL  Final  Culture, Respiratory w Gram Stain     Status: None (Preliminary result)   Collection Time: 09/27/23  9:04 AM   Specimen: Sputum  Result Value Ref Range Status  Specimen Description   Final    SPU Performed at [facility], [address] [place], [place] [address]    Special Requests   Final    Immunocompromised Reflexed from [phone] Performed at [facility], [address], [place], [place] [address]    Gram Stain   Final    ABUNDANT WBC PRESENT,BOTH PMN AND MONONUCLEAR MODERATE SQUAMOUS EPITHELIAL CELLS PRESENT MODERATE GRAM POSITIVE COCCI IN PAIRS RARE GRAM NEGATIVE RODS FEW GRAM POSITIVE RODS    Culture   Final    CULTURE REINCUBATED FOR BETTER GROWTH Performed at [facility], [address], [place], [place] [address]    Report Status PENDING  Incomplete    Impression/Plan:  1.  Acute hypoxic respiratory failure with pneumonia.  Seen yesterday with significant hypoxemia, though today has been on room air.  Seems to be improving overall with oxygen needs.  On ceftriaxone for potential  bacterial pneumonia though procalcitonin reassuring and chest x-ray more consistent with viral etiology.  Will continue for now.  2.  HIV.  He has continued on Biktarvy after long term poor compliance previously.  Tolerating this well though was unable to get that today due to underlying swallowing difficulties and currently getting a swallow eval.  Biktarvy.  3.  Positive cryptococcal antigen in the blood.  He has been on fluconazole for this empirically though was negative for cryptococcal meningitis last hospitalization.  Will continue with fluconazole and will transition that to IV for now.  4.  Dysphagia.  He is currently getting a modified barium swallow for evaluation and also MRI ordered.  He did have some chronic findings on MRI in February.   Evaluation of this patient requires complex antimicrobial therapy evaluation and counseling + isolation needs for disease transmission risk assessment and mitigation.

## 2023-09-28 NOTE — Progress Notes (Signed)
 Modified Barium Swallow Study  Patient Details  Name: Jeremiah Stevens MRN: [id number] Date of Birth: 1976/07/10  Today's Date: 09/28/2023  Modified Barium Swallow completed.  Full report located under Chart Review in the Imaging Section.  History of Present Illness Patient is a 48 y.o. male with PMH: DM-2, seizure disorder, HIV (untreated), obesity, polysubstance abuse, h/o cryptococcal disease. He presented to the [facility] ED on 09/26/23 with c/o of inability to swallow for the past three days with HA, hiccups and drooling. He was tachypneic in ED on RA and placed on oxygen via nasal cannula. DG neck soft tissue was negative for retropharyngeal soft tissue swelling or epiglottic enlargement, CXR portable suggestive of small airway infection/inflammation. CT angio/chest/PE showed Bilateral mid and lower lung airspace opacities concerning for multifocal pneumonia, less likely edema.  Pt with dysarthria and reports new SUDDEN onset of dysphagia x1 week ago and cough x4 days.  MBS indicated.   Clinical Impression Patient presents with mild oral and moderately severe pharyngeal phase dysphagia.  Patient demonstrates significant difficulty initiating swallow - with liquids filling pyriform sinus prior to swallow initiation - for up to 4 seconds with boluses filling pyrifom sinus.  Pt demonstrates penetration of liquids prior to swallow initiation x1 as barium spills into open airway before the swallow. However mostly aspiration is occuring AFTER the swallow as retention adheres to significant pharyngeal secretionsand spills into airway post-swallow.  She is clearing approx 75-85% of boluses with swallowing - however post-swallow retention is aspirated.  A multitude of compensation strategies attempted including head turn right *where pt senses retention* and left as well as chin tuck with head turn left did not improve clearance.  Dry swallows helps with clearance but are very difficult for pt to conduct.  PES  opening was inadequate allowing retention in pyriform sinus.   Pt is aspirating secretions chronically.  She reports desire to eat - regardless of dysphagia.  Defer to MD for po option with mitigation strategies.   Pt may aspirate less frequently with full liquid - nectar at this time and allow thin water between meals. Factors that may increase risk of adverse event in presence of aspiration [employer] 2021): Aspiration of thick, dense, and/or acidic materials (poor secretion management)  Swallow Evaluation Recommendations Recommendations:  (defer to MD, but pt wants to eat)--  pt wants po regardless of aspiration risk Medication Administration: Other (Comment)  Rolena Infante, MS [facility] SLP Acute Rehab Services Office [phone]     Chales Abrahams 09/28/2023,4:15 PM

## 2023-09-28 NOTE — Consult Note (Signed)
 NEUROLOGY CONSULT NOTE   Date of service: September 28, 2023 Patient Name: Jeremiah Stevens MRN:  [id number] DOB:  Mar 26, 1976 Chief Complaint: "dysphagia, slight R facial droop and R leg weakness" Requesting Provider: Willeen Niece, MD  History of Present Illness  Jeremiah Stevens is a 48 y.o. adult with hx of HIV, diabetes, HTN, HLD, current smoker, who woke up 5 days ago and was unable to swallow with something stuck in my throat.she was admitted and treated for a pneumonia.  She endorses prior hx of stroke, endorses family hx of stroke, endorses hx f DM2, HTN, HLD she is a current everyday smoker.  LKW: 09/24/2023 Modified rankin score: 0-Completely asymptomatic and back to baseline post- stroke IV Thrombolysis: Not offered, she is outside the window.   EVT: Not offered, she is outside of window.    NIHSS components Score: Comment  1a Level of Conscious 0[x]  1[]  2[]  3[]      1b LOC Questions 0[x]  1[]  2[]       1c LOC Commands 0[x]  1[]  2[]       2 Best Gaze 0[x]  1[]  2[]       3 Visual 0[x]  1[]  2[]  3[]      4 Facial Palsy 0[]  1[x]  2[]  3[]      5a Motor Arm - left 0[x]  1[]  2[]  3[]  4[]  UN[]    5b Motor Arm - Right 0[x]  1[]  2[]  3[]  4[]  UN[]    6a Motor Leg - Left 0[x]  1[]  2[]  3[]  4[]  UN[]    6b Motor Leg - Right 0[x]  1[]  2[]  3[]  4[]  UN[]    7 Limb Ataxia 0[]  1[x]  2[]  3[]  UN[]     8 Sensory 0[x]  1[]  2[]  UN[]      9 Best Language 0[x]  1[]  2[]  3[]      10 Dysarthria 0[]  1[x]  2[]  UN[]      11 Extinct. and Inattention 0[x]  1[]  2[]       TOTAL: 3      ROS  Comprehensive ROS performed and pertinent positives documented in HPI   Past History   Past Medical History:  Diagnosis Date   Cancer (HCC)    seizures   Diabetes (HCC)    Diabetes mellitus without complication (HCC)    Heartburn    occasional; OTC as needed   Herpes genitalis in men    HIV (human immunodeficiency virus infection) (HCC)    HIV disease (HCC)    HTN (hypertension)    Hyperlipidemia    Hypertension    under control  with med., has been on med. x 1 yr.   Lateral malleolar fracture 09/02/2013   left   Migraines    Tear of deltoid ligament of left ankle 09/02/2013   Type 2 diabetes mellitus with hyperosmolar nonketotic hyperglycemia (HCC) 02/10/2020    Past Surgical History:  Procedure Laterality Date   NO PAST SURGERIES     ORIF ANKLE FRACTURE Left 09/13/2013   Procedure: OPEN REDUCTION INTERNAL FIXATION (ORIF) LEFT LATERAL MALLEOLUS ANKLE FRACTURE ;  Surgeon: Dannielle Huh, MD;  Location: [facility];  Service: Orthopedics;  Laterality: Left;    Family History: Family History  Problem Relation Age of Onset   Hypertension Mother     Social History  reports that she has been smoking cigarettes. She started smoking about 26 years ago. She has a 13.1 pack-year smoking history. She has never used smokeless tobacco. She reports that she does not currently use alcohol. She reports that she does not currently use drugs after having used the following drugs: Marijuana. Frequency: 7.00  times per week.  Allergies  Allergen Reactions   Penicillins Anaphylaxis   Peanut-Containing Drug Products Other (See Comments)    WALNUTS - SORES ON TONGUE   Sustiva [Efavirenz] Rash    Medications   Current Facility-Administered Medications:    acetaminophen (TYLENOL) tablet 650 mg, 650 mg, Oral, Q6H PRN, 650 mg at 09/27/23 0505 **OR** acetaminophen (TYLENOL) suppository 650 mg, 650 mg, Rectal, Q6H PRN, Doutova, Anastassia, MD   bictegravir-emtricitabine-tenofovir AF (BIKTARVY) 50-200-25 MG per tablet 1 tablet, 1 tablet, Oral, Daily, Doutova, Anastassia, MD, 1 tablet at 09/27/23 0951   cefTRIAXone (ROCEPHIN) 2 g in sodium chloride 0.9 % 100 mL IVPB, 2 g, Intravenous, Daily, Comer, Belia Heman, MD, Last Rate: 200 mL/hr at 09/28/23 1130, 2 g at 09/28/23 1130   diltiazem (CARDIZEM CD) 24 hr capsule 360 mg, 360 mg, Oral, Daily, Doutova, Anastassia, MD, 360 mg at 09/28/23 1115   fluconazole (DIFLUCAN) IVPB 400 mg,  400 mg, Intravenous, Daily, Idelle Leech, Pardeep, MD   guaiFENesin (MUCINEX) 12 hr tablet 600 mg, 600 mg, Oral, BID, Doutova, Anastassia, MD, 600 mg at 09/27/23 2339   HYDROcodone-acetaminophen (NORCO/VICODIN) 5-325 MG per tablet 1-2 tablet, 1-2 tablet, Oral, Q4H PRN, Doutova, Anastassia, MD   insulin aspart (novoLOG) injection 0-9 Units, 0-9 Units, Subcutaneous, Q4H, Doutova, Anastassia, MD, 2 Units at 09/28/23 0438   insulin glargine (LANTUS) injection 20 Units, 20 Units, Subcutaneous, QHS, Doutova, Anastassia, MD, 20 Units at 09/28/23 0005   ipratropium-albuterol (DUONEB) 0.5-2.5 (3) MG/3ML nebulizer solution 3 mL, 3 mL, Nebulization, QID, Doutova, Anastassia, MD, 3 mL at 09/28/23 1157   levalbuterol (XOPENEX) nebulizer solution 0.63 mg, 0.63 mg, Nebulization, Q6H PRN, Doutova, Anastassia, MD   ondansetron (ZOFRAN) tablet 4 mg, 4 mg, Oral, Q6H PRN **OR** ondansetron (ZOFRAN) injection 4 mg, 4 mg, Intravenous, Q6H PRN, Doutova, Anastassia, MD   pneumococcal 20-valent conjugate vaccine (PREVNAR 20) injection 0.5 mL, 0.5 mL, Intramuscular, Tomorrow-1000, Pudota, Kingsley P, MD   polyethylene glycol (MIRALAX / GLYCOLAX) packet 17 g, 17 g, Oral, Daily PRN, Doutova, Anastassia, MD   sulfamethoxazole-trimethoprim (BACTRIM DS) 800-160 MG per tablet 1 tablet, 1 tablet, Oral, Daily, Doutova, Anastassia, MD, 1 tablet at 09/27/23 0951  Vitals   Vitals:   09/28/23 0829 09/28/23 1023 09/28/23 1115 09/28/23 1157  BP:  (!) 162/101 (!) 162/101   Pulse:      Resp:      Temp:  98.2 F (36.8 C)    TempSrc:  Oral    SpO2: 95% 100%  93%  Weight:      Height:        Body mass index is 34.62 kg/m.  Physical Exam   General: Laying comfortably in bed; in no acute distress.  HENT: Normal oropharynx and mucosa. Normal external appearance of ears and nose.  Neck: Supple, no pain or tenderness  CV: No JVD. No peripheral edema.  Pulmonary: Symmetric Chest rise. Normal respiratory effort.  Abdomen: Soft to touch,  non-tender.  Ext: No cyanosis, edema, or deformity  Skin: No rash. Normal palpation of skin.   Musculoskeletal: Normal digits and nails by inspection. No clubbing.   Neurologic Examination  Mental status/Cognition: Alert, oriented to self, place, month and year, good attention.  Speech/language: Slurred speech, fluent, comprehension intact, object naming intact, repetition intact.  Cranial nerves:   CN II Pupils equal and reactive to light, no VF deficits    CN III,IV,VI EOM intact, no gaze preference or deviation, no nystagmus    CN V normal sensation in V1, V2,  and V3 segments bilaterally    CN VII Slight right facial droop.   CN VIII normal hearing to speech   CN IX & X normal palatal elevation, no uvular deviation    CN XI 5/5 head turn and 5/5 shoulder shrug bilaterally    CN XII midline tongue protrusion    Motor:  Muscle bulk: Normal, tone normal, pronator drift none tremor none Mvmt Root Nerve  Muscle Right Left Comments  SA C5/6 Ax Deltoid 5 5   EF C5/6 Mc Biceps 5 5   EE C6/7/8 Rad Triceps 5 5   WF C6/7 Med FCR     WE C7/8 PIN ECU     F Ab C8/T1 U ADM/FDI 5 5   HF L1/2/3 Fem Illopsoas 4+ 5   KE L2/3/4 Fem Quad 5 5   DF L4/5 D Peron Tib Ant 5 5   PF S1/2 Tibial Grc/Sol 5 5    Sensation:  Light touch Intact throughout   Pin prick    Temperature    Vibration   Proprioception    Coordination/Complex Motor:  - Finger to Nose intact bilaterally - Heel to shin with ataxia in right lower extremity. - Rapid alternating movement are slowed in right upper extremity. - Gait: Deferred for patient's safety.  Labs/Imaging/Neurodiagnostic studies   CBC:  Recent Labs  Lab Oct 20, 2023 2101 October 20, 2023 2214 09/27/23 0518 09/28/23 0500  WBC 6.9  --  6.6 6.0  NEUTROABS 5.7  --   --  4.5  HGB 11.7*   < > 11.4* 11.4*  HCT 34.6*   < > 34.6* 35.2*  MCV 87.4  --  89.2 91.0  PLT 194  --  193 221   < > = values in this interval not displayed.   Basic Metabolic Panel:  Lab  Results  Component Value Date   NA 133 (L) 09/28/2023   K 4.9 09/28/2023   CO2 25 09/28/2023   GLUCOSE 174 (H) 09/28/2023   BUN 32 (H) 09/28/2023   CREATININE 1.24 09/28/2023   CALCIUM 9.4 09/28/2023   GFRNONAA >60 09/28/2023   GFRAA 103 05/22/2020   Lipid Panel:  Lab Results  Component Value Date   LDLCALC 78 02/10/2020   HgbA1c:  Lab Results  Component Value Date   HGBA1C 11.7 (H) 08/26/2023   Urine Drug Screen:     Component Value Date/Time   LABOPIA NONE DETECTED 09/27/2023 0115   COCAINSCRNUR NONE DETECTED 09/27/2023 0115   LABBENZ NONE DETECTED 09/27/2023 0115   AMPHETMU NONE DETECTED 09/27/2023 0115   THCU POSITIVE (A) 09/27/2023 0115   LABBARB NONE DETECTED 09/27/2023 0115    Alcohol Level     Component Value Date/Time   ETH <10 08/25/2023 1739   INR  Lab Results  Component Value Date   INR 1.0 20-Oct-2023   APTT  Lab Results  Component Value Date   APTT 32 2023-10-20   AED levels: No results found for: "PHENYTOIN", "ZONISAMIDE", "LAMOTRIGINE", "LEVETIRACETA"  CT Head without contrast(Personally reviewed): CTH was negative for a large hypodensity concerning for a large territory infarct or hyperdensity concerning for an ICH  CT angio Head and Neck with contrast(Personally reviewed): Pending  MRI Brain: Pending   ASSESSMENT   Rhea Thrun is a 48 y.o. adult with hx of HIV, diabetes, HTN, HLD, current smoker, who woke up 5 days ago and was unable to swallow with globus sensation and slight right facial droop, dysarthric speech and mild right leg weakness and ataxia.  Presentation is concerning for a possible brainstem stroke.  RECOMMENDATIONS  - Frequent Neuro checks per stroke unit protocol - Recommend brain imaging with MRI Brain without contrast - Recommend Vascular imaging with CT angio of the head and neck with and without contrast.- Recommend obtaining TTE  - Recommend obtaining Lipid panel with LDL - Please start statin if LDL >  70 - Recommend HbA1c to evaluate for diabetes and how well it is controlled. - Antithrombotic -aspirin 81 mg daily along with Plavix 75 mg daily for 21 days, followed by aspirin 81 mg daily alone. - Recommend DVT ppx - SBP goal - permissive hypertension first 24 h < 220/110. Held home meds.  - Recommend Telemetry monitoring for arrythmia - Recommend bedside swallow screen prior to PO intake. - Stroke education booklet - Recommend PT/OT/SLP consult - Counseled her on the importance of quitting smoking to reduce risk of stroke in the future.  ______________________________________________________________________  Welton Flakes, MD [facility]

## 2023-09-28 NOTE — Progress Notes (Signed)
 PROGRESS NOTE    Jeremiah Stevens  LKG:[id number] DOB: March 22, 1976 DOA: 09/26/2023 PCP: Judyann Munson, MD   Brief Narrative:  This 48 yrs old Male with medical history significant of diabetes type 2, seizure disorder, HIV untreated, obesity, polysubstance abuse.  Patient is admitted for pneumonia with hypoxia in the setting of HIV.   Assessment & Plan:   Principal Problem:   CAP (community acquired pneumonia) Active Problems:   Human immunodeficiency virus (HIV) disease   HTN (hypertension)   Uncontrolled type 2 diabetes mellitus with hyperglycemia, without long-term current use of insulin (HCC)   Tobacco abuse   Seizure (HCC)   Acute respiratory failure with hypoxia (HCC)   Globus sensation    Acute hypoxic respiratory failure in the setting of pneumonia: Continue supplemental oxygen as needed. Continuous pulse ox check Pulse ox with ambulation prior to discharge may need  TC consult for home O2 set up Hypercarbia well compensated. Continue flutter valve ordered  CAP (community acquired pneumonia): CT scan negative for pulmonary embolism but confirms lower lung opacities suggestive of multifocal pneumonia. Continue IV antibiotics cefepime, azithromycin, fluconazole and Bactrim Continue supportive care with DuoNebs/Xopenex. Continue to monitor blood cultures, respiratory cultures Respiratory panel for flu RSV and COVID are negative Ordered respiratory culture, urine for Legionella and Streptococcus currently pending   HTN (hypertension): Continue Cardizem.   Human immunodeficiency virus (HIV) disease /AIDS. Continue Biktarvy. Appreciate ID consult Continue fluconazole and Bactrim.   Tobacco abuse Encourage tobacco cessation.  Provide tobacco cessation materials   Uncontrolled type 2 diabetes mellitus with hyperglycemia, without long-term current use of insulin (HCC) Continue SSI  Continue home insulin but decreased to  20units, Hold Oral diabetic medications    Seizure (HCC) in the setting of HHS at dc,  neurology felt no need for anti seizure meds.  Dysphagia : Likely in the setting of HIV. Patient underwent MBS there was a concern for neurological cause for dysphagia.   Neurology consulted ordered MRI.   DVT prophylaxis: SCDs Code Status: Full code Family Communication:No family at bed side Disposition Plan:    Status is: Inpatient Remains inpatient appropriate because: Severity of illness   Consultants:  Neurology  Procedures:   Antimicrobials:  Anti-infectives (From admission, onward)    Start     Dose/Rate Route Frequency Ordered Stop   09/27/23 1600  cefTRIAXone (ROCEPHIN) 2 g in sodium chloride 0.9 % 100 mL IVPB        2 g 200 mL/hr over 30 Minutes Intravenous Daily 09/27/23 1004     09/27/23 1000  azithromycin (ZITHROMAX) tablet 500 mg  Status:  Discontinued        500 mg Oral Daily 09/26/23 2307 09/27/23 1004   09/27/23 1000  bictegravir-emtricitabine-tenofovir AF (BIKTARVY) 50-200-25 MG per tablet 1 tablet        1 tablet Oral Daily 09/27/23 0004     09/27/23 1000  sulfamethoxazole-trimethoprim (BACTRIM DS) 800-160 MG per tablet 1 tablet        1 tablet Oral Daily 09/27/23 0017     09/27/23 0800  ceFEPIme (MAXIPIME) 2 g in sodium chloride 0.9 % 100 mL IVPB  Status:  Discontinued        2 g 200 mL/hr over 30 Minutes Intravenous Every 8 hours 09/26/23 2333 09/27/23 1004   09/27/23 0000  fluconazole (DIFLUCAN) tablet 400 mg        400 mg Oral Daily 09/26/23 2352     09/26/23 2300  ceFEPIme (MAXIPIME) 2 g in sodium chloride  0.9 % 100 mL IVPB        2 g 200 mL/hr over 30 Minutes Intravenous  Once 09/26/23 2245 09/26/23 2357   09/26/23 2300  vancomycin (VANCOREADY) IVPB 2000 mg/400 mL        2,000 mg 200 mL/hr over 120 Minutes Intravenous  Once 09/26/23 2245 09/27/23 0305       Subjective: Patient was seen and examined at bedside.  Overnight events noted.   She reports having difficulty  swallowing.  Objective: Vitals:   09/28/23 0829 09/28/23 1023 09/28/23 1115 09/28/23 1157  BP:  (!) 162/101 (!) 162/101   Pulse:      Resp:      Temp:  98.2 F (36.8 C)    TempSrc:  Oral    SpO2: 95% 100%  93%  Weight:      Height:       No intake or output data in the 24 hours ending 09/28/23 1331  Filed Weights   09/27/23 2256  Weight: 115.8 kg    Examination:  General exam: Appears calm and comfortable, deconditioned, not in any acute distress. Respiratory system: CTA Bilaterally. Respiratory effort normal.  RR 15 Cardiovascular system: S1 & S2 heard, RRR. No JVD, murmurs, rubs, gallops or clicks. No pedal edema. Gastrointestinal system: Abdomen is non distended, soft and non tender. Normal bowel sounds heard. Central nervous system: Alert and oriented x 3 . No focal neurological deficits. Extremities: Symmetric 5 x 5 power. Skin: No rashes, lesions or ulcers Psychiatry: Judgement and insight appear normal. Mood & affect appropriate.     Data Reviewed: I have personally reviewed following labs and imaging studies  CBC: Recent Labs  Lab 09/25/23 0058 09/26/23 2101 09/26/23 2214 09/27/23 0518 09/28/23 0500  WBC 5.6 6.9  --  6.6 6.0  NEUTROABS  --  5.7  --   --  4.5  HGB 11.5* 11.7* 11.6* 11.4* 11.4*  HCT 33.0* 34.6* 34.0* 34.6* 35.2*  MCV 85.7 87.4  --  89.2 91.0  PLT 215 194  --  193 221   Basic Metabolic Panel: Recent Labs  Lab 09/25/23 0058 09/26/23 2101 09/26/23 2214 09/27/23 0518 09/28/23 0500  NA 130* 132* 135 133* 133*  K 3.8 3.9 4.2 4.4 4.9  CL 97* 96* 98 100 98  CO2 21* 28  --  25 25  GLUCOSE 355* 230* 226* 249* 174*  BUN 11 18 17 19  32*  CREATININE 1.18 1.25* 1.20 1.27* 1.24  CALCIUM 9.9 9.4  --  8.8* 9.4  MG  --   --   --  1.9  --   PHOS  --   --   --  4.6  --    GFR: Estimated Creatinine Clearance (by C-G formula based on SCr of 1.24 mg/dL) Male: 16.1 mL/min Male: 96.8 mL/min Liver Function Tests: Recent Labs  Lab  09/25/23 0058 09/26/23 2101 09/27/23 0518 09/28/23 0500  AST 29 24 26  33  ALT 45* 45* 42 39  ALKPHOS 81 78 76 71  BILITOT 0.4 0.7 0.5 1.1  PROT 9.0* 9.3* 9.2* 9.6*  ALBUMIN 3.2* 3.3* 3.1* 3.5   No results for input(s): "LIPASE", "AMYLASE" in the last 168 hours. No results for input(s): "AMMONIA" in the last 168 hours. Coagulation Profile: Recent Labs  Lab 09/26/23 2101  INR 1.0   Cardiac Enzymes: Recent Labs  Lab 09/27/23 0518  CKTOTAL 67   BNP (last 3 results) No results for input(s): "PROBNP" in the last 8760 hours. HbA1C: No  results for input(s): "HGBA1C" in the last 72 hours. CBG: Recent Labs  Lab 09/27/23 2106 09/27/23 2346 09/28/23 0406 09/28/23 0725 09/28/23 1139  GLUCAP 132* 124* 164* 143* 170*   Lipid Profile: No results for input(s): "CHOL", "HDL", "LDLCALC", "TRIG", "CHOLHDL", "LDLDIRECT" in the last 72 hours. Thyroid Function Tests: Recent Labs    09/27/23 0518  TSH 0.210*   Anemia Panel: No results for input(s): "VITAMINB12", "FOLATE", "FERRITIN", "TIBC", "IRON", "RETICCTPCT" in the last 72 hours. Sepsis Labs: Recent Labs  Lab 09/26/23 2333 09/27/23 0102 09/27/23 0518 09/27/23 0532  PROCALCITON  --   --  0.25  --   LATICACIDVEN 0.5 0.8  --  1.4    Recent Results (from the past 240 hours)  Blood culture (routine x 2)     Status: None (Preliminary result)   Collection Time: 09/26/23  9:01 PM   Specimen: BLOOD  Result Value Ref Range Status   Specimen Description   Final    BLOOD LEFT ANTECUBITAL Performed at [facility], [address], [place], [place] [address]    Special Requests   Final    BOTTLES DRAWN AEROBIC AND ANAEROBIC Blood Culture adequate volume Performed at [facility], [address] [place], [place] [address]    Culture   Final    NO GROWTH 2 DAYS Performed at [facility], [address], [place], [place] [address]    Report Status PENDING  Incomplete  Blood  culture (routine x 2)     Status: None (Preliminary result)   Collection Time: 09/26/23  9:01 PM   Specimen: BLOOD  Result Value Ref Range Status   Specimen Description   Final    BLOOD RIGHT ANTECUBITAL Performed at [facility], [address] [place], [place] [address]    Special Requests   Final    BOTTLES DRAWN AEROBIC AND ANAEROBIC Blood Culture adequate volume Performed at [facility], [address], [place], [place] [address]    Culture   Final    NO GROWTH 2 DAYS Performed at [facility], [address], [place], [place] [address]    Report Status PENDING  Incomplete  Resp panel by RT-PCR (RSV, Flu A&B, Covid)     Status: None   Collection Time: 09/26/23  9:01 PM   Specimen: Nasal Swab  Result Value Ref Range Status   SARS Coronavirus 2 by RT PCR NEGATIVE NEGATIVE Final    Comment: (NOTE) SARS-CoV-2 target nucleic acids are NOT DETECTED.  The SARS-CoV-2 RNA is generally detectable in upper respiratory specimens during the acute phase of infection. The lowest concentration of SARS-CoV-2 viral copies this assay can detect is 138 copies/mL. A negative result does not preclude SARS-Cov-2 infection and should not be used as the sole basis for treatment or other patient management decisions. A negative result may occur with  improper specimen collection/handling, submission of specimen other than nasopharyngeal swab, presence of viral mutation(s) within the areas targeted by this assay, and inadequate number of viral copies(<138 copies/mL). A negative result must be combined with clinical observations, patient history, and epidemiological information. The expected result is Negative.  Fact Sheet for Patients:  BloggerCourse.com  Fact Sheet for Healthcare Providers:  SeriousBroker.it  This test is no t yet approved or cleared by the [place] FDA and  has been authorized  for detection and/or diagnosis of SARS-CoV-2 by FDA under an Emergency Use Authorization (EUA). This EUA will remain  in effect (meaning this test can be used) for the duration of the COVID-19 declaration under Section 564(b)(1) of the Act, 21 U.S.C.section 360bbb-3(b)(1), unless the authorization is terminated  or revoked sooner.       Influenza A by PCR NEGATIVE NEGATIVE Final   Influenza B by PCR NEGATIVE NEGATIVE Final    Comment: (NOTE) The Xpert Xpress SARS-CoV-2/FLU/RSV plus assay is intended as an aid in the diagnosis of influenza from Nasopharyngeal swab specimens and should not be used as a sole basis for treatment. Nasal washings and aspirates are unacceptable for Xpert Xpress SARS-CoV-2/FLU/RSV testing.  Fact Sheet for Patients: BloggerCourse.com  Fact Sheet for Healthcare Providers: SeriousBroker.it  This test is not yet approved or cleared by the [place] FDA and has been authorized for detection and/or diagnosis of SARS-CoV-2 by FDA under an Emergency Use Authorization (EUA). This EUA will remain in effect (meaning this test can be used) for the duration of the COVID-19 declaration under Section 564(b)(1) of the Act, 21 U.S.C. section 360bbb-3(b)(1), unless the authorization is terminated or revoked.     Resp Syncytial Virus by PCR NEGATIVE NEGATIVE Final    Comment: (NOTE) Fact Sheet for Patients: BloggerCourse.com  Fact Sheet for Healthcare Providers: SeriousBroker.it  This test is not yet approved or cleared by the [place] FDA and has been authorized for detection and/or diagnosis of SARS-CoV-2 by FDA under an Emergency Use Authorization (EUA). This EUA will remain in effect (meaning this test can be used) for the duration of the COVID-19 declaration under Section 564(b)(1) of the Act, 21 U.S.C. section 360bbb-3(b)(1), unless the authorization is  terminated or revoked.  Performed at [facility], [address], [place], [place] [address]   MRSA Next Gen by PCR, Nasal     Status: None   Collection Time: 09/27/23  8:10 AM   Specimen: Nasal Mucosa; Nasal Swab  Result Value Ref Range Status   MRSA by PCR Next Gen NOT DETECTED NOT DETECTED Final    Comment: (NOTE) The GeneXpert MRSA Assay (FDA approved for NASAL specimens only), is one component of a comprehensive MRSA colonization surveillance program. It is not intended to diagnose MRSA infection nor to guide or monitor treatment for MRSA infections. Test performance is not FDA approved in patients less than 62 years old. Performed at [facility], [address] [place], [place] [address]   Expectorated Sputum Assessment w Gram Stain, Rflx to Resp Cult     Status: None   Collection Time: 09/27/23  9:04 AM   Specimen: Sputum  Result Value Ref Range Status   Specimen Description SPU  Final   Special Requests Immunocompromised  Final   Sputum evaluation   Final    THIS SPECIMEN IS ACCEPTABLE FOR SPUTUM CULTURE Performed at [facility], [address], [place], [place] [address]    Report Status 09/27/2023 FINAL  Final  Culture, Respiratory w Gram Stain     Status: None (Preliminary result)   Collection Time: 09/27/23  9:04 AM   Specimen: Sputum  Result Value Ref Range Status   Specimen Description   Final    SPU Performed at [facility], [address], [place], [place] [address]    Special Requests   Final    Immunocompromised Reflexed from [phone] Performed at [facility], [address] [place], [place] [address]    Gram Stain   Final    ABUNDANT WBC PRESENT,BOTH PMN AND MONONUCLEAR  MODERATE SQUAMOUS EPITHELIAL CELLS PRESENT MODERATE GRAM POSITIVE COCCI IN PAIRS RARE GRAM NEGATIVE RODS FEW GRAM POSITIVE RODS    Culture   Final    CULTURE REINCUBATED FOR BETTER  GROWTH Performed at [facility], [address] [place], [place] [address]    Report Status PENDING  Incomplete    Radiology Studies: CT Angio Chest Pulmonary Embolism (PE) W or WO Contrast Result Date: 09/27/2023 CLINICAL DATA:  Shortness of breath, hypoxia EXAM: CT ANGIOGRAPHY CHEST WITH CONTRAST TECHNIQUE: Multidetector CT imaging of the chest was performed using the standard protocol during bolus administration of intravenous contrast. Multiplanar CT image reconstructions and MIPs were obtained to evaluate the vascular anatomy. RADIATION DOSE REDUCTION: This exam was performed according to the departmental dose-optimization program which includes automated exposure control, adjustment of the mA and/or kV according to patient size and/or use of iterative reconstruction technique. CONTRAST:  OMNIPAQUE IOHEXOL 350 MG/ML SOLN COMPARISON:  08/28/2023 FINDINGS: Cardiovascular: Heart is normal size. Aorta is normal caliber. Scattered coronary artery calcifications. No filling defects in the pulmonary arteries to suggest pulmonary emboli. Mediastinum/Nodes: No mediastinal, hilar, or axillary adenopathy. Trachea and thyroid unremarkable. There is wall thickening involving the distal esophagus suggesting esophagitis. Lungs/Pleura: Again noted is the 1.8 cm left lower lobe subpleural nodule medially on image 99, stable since prior study. Airspace disease noted in both lower lobes, right middle lobe and lingula concerning for pneumonia. No effusions. Upper Abdomen: Hepatic steatosis.  No acute findings. Musculoskeletal: Chest wall soft tissues are unremarkable. No acute bony abnormality. Review of the MIP images confirms the above findings. IMPRESSION: No evidence of pulmonary embolus. Bilateral mid and lower lung airspace opacities concerning for multifocal pneumonia, less likely edema. Hepatic steatosis. Scattered coronary artery disease. Electronically Signed   By: Charlett Nose M.D.   On: 09/27/2023  00:11   DG Chest Portable 1 View Result Date: 09/26/2023 CLINICAL DATA:  Shortness of breath, hypoxia EXAM: PORTABLE CHEST 1 VIEW COMPARISON:  Radiograph 08/25/2023 and CT chest 08/28/2023 FINDINGS: Stable cardiomegaly. Reticulonodular opacities in the lower lungs. Retrocardiac atelectasis or consolidation. No pleural effusion or pneumothorax. No displaced rib fractures. IMPRESSION: Reticulonodular opacities in the lower lungs, suggestive of small airway infection/inflammation. Electronically Signed   By: Minerva Fester M.D.   On: 09/26/2023 21:08   DG Neck Soft Tissue Result Date: 09/26/2023 CLINICAL DATA:  Dysphagia, drooling, concern for epiglottitis EXAM: NECK SOFT TISSUES - 1+ VIEW COMPARISON:  None Available. FINDINGS: There is no evidence of retropharyngeal soft tissue swelling or epiglottic enlargement. The cervical airway is unremarkable and no radio-opaque foreign body identified. IMPRESSION: Negative. Electronically Signed   By: Minerva Fester M.D.   On: 09/26/2023 21:06   Scheduled Meds:  bictegravir-emtricitabine-tenofovir AF  1 tablet Oral Daily   diltiazem  360 mg Oral Daily   fluconazole  400 mg Oral Daily   guaiFENesin  600 mg Oral BID   insulin aspart  0-9 Units Subcutaneous Q4H   insulin glargine  20 Units Subcutaneous QHS   ipratropium-albuterol  3 mL Nebulization QID   pneumococcal 20-valent conjugate vaccine  0.5 mL Intramuscular Tomorrow-1000   sulfamethoxazole-trimethoprim  1 tablet Oral Daily   Continuous Infusions:  cefTRIAXone (ROCEPHIN)  IV 2 g (09/28/23 1130)     LOS: 2 days    Time spent: 50 mins    Willeen Niece, MD [facility]   If 7PM-7AM, please contact night-coverage

## 2023-09-28 NOTE — Discharge Instructions (Addendum)
 Carbohydrate Counting For People With Diabetes  Foods with carbohydrates make your blood glucose level go up. Learning how to count carbohydrates can help you control your blood glucose levels. First, identify the foods you eat that contain carbohydrates. Then, using the Foods with Carbohydrates chart, determine about how much carbohydrates are in your meals and snacks. Make sure you are eating foods with fiber, protein, and healthy fat along with your carbohydrate foods. Foods with Carbohydrates The following table shows carbohydrate foods that have about 15 grams of carbohydrate each. Using measuring cups, spoons, or a food scale when you first begin learning about carbohydrate counting can help you learn about the portion sizes you typically eat. The following foods have 15 grams carbohydrate each:  Grains 1 slice bread (1 ounce)  1 small tortilla (6-inch size)   large bagel (1 ounce)  1/3 cup pasta or rice (cooked)   hamburger or hot dog bun ( ounce)   cup cooked cereal   to  cup ready-to-eat cereal  2 taco shells (5-inch size) Fruit 1 small fresh fruit ( to 1 cup)   medium banana  17 small grapes (3 ounces)  1 cup melon or berries   cup canned or frozen fruit  2 tablespoons dried fruit (blueberries, cherries, cranberries, raisins)   cup unsweetened fruit juice  Starchy Vegetables  cup cooked beans, peas, corn, potatoes/sweet potatoes   large baked potato (3 ounces)  1 cup acorn or butternut squash  Snack Foods 3 to 6 crackers  8 potato chips or 13 tortilla chips ( ounce to 1 ounce)  3 cups popped popcorn  Dairy 3/4 cup (6 ounces) nonfat plain yogurt, or yogurt with sugar-free sweetener  1 cup milk  1 cup plain rice, soy, coconut or flavored almond milk Sweets and Desserts  cup ice cream or frozen yogurt  1 tablespoon jam, jelly, pancake syrup, table sugar, or honey  2 tablespoons light pancake syrup  1 inch square of frosted cake or 2 inch square of unfrosted  cake  2 small cookies (2/3 ounce each) or  large cookie  Sometimes you'll have to estimate carbohydrate amounts if you don't know the exact recipe. One cup of mixed foods like soups can have 1 to 2 carbohydrate servings, while some casseroles might have 2 or more servings of carbohydrate. Foods that have less than 20 calories in each serving can be counted as "free" foods. Count 1 cup raw vegetables, or  cup cooked non-starchy vegetables as "free" foods. If you eat 3 or more servings at one meal, then count them as 1 carbohydrate serving.  Foods without Carbohydrates  Not all foods contain carbohydrates. Meat, some dairy, fats, non-starchy vegetables, and many beverages don't contain carbohydrate. So when you count carbohydrates, you can generally exclude chicken, pork, beef, fish, seafood, eggs, tofu, cheese, butter, sour cream, avocado, nuts, seeds, olives, mayonnaise, water , black coffee, unsweetened tea, and zero-calorie drinks. Vegetables with no or low carbohydrate include green beans, cauliflower, tomatoes, and onions. How much carbohydrate should I eat at each meal?  Carbohydrate counting can help you plan your meals and manage your weight. Following are some starting points for carbohydrate intake at each meal. Work with your registered dietitian nutritionist to find the best range that works for your blood glucose and weight.   To Lose Weight To Maintain Weight  Women 2 - 3 carb servings 3 - 4 carb servings  Men 3 - 4 carb servings 4 - 5 carb servings  Checking your  blood glucose after meals will help you know if you need to adjust the timing, type, or number of carbohydrate servings in your meal plan. Achieve and keep a healthy body weight by balancing your food intake and physical activity.  Tips How should I plan my meals?  Plan for half the food on your plate to include non-starchy vegetables, like salad greens, broccoli, or carrots. Try to eat 3 to 5 servings of non-starchy vegetables  every day. Have a protein food at each meal. Protein foods include chicken, fish, meat, eggs, or beans (note that beans contain carbohydrate). These two food groups (non-starchy vegetables and proteins) are low in carbohydrate. If you fill up your plate with these foods, you will eat less carbohydrate but still fill up your stomach. Try to limit your carbohydrate portion to  of the plate.  What fats are healthiest to eat?  Diabetes increases risk for heart disease. To help protect your heart, eat more healthy fats, such as olive oil, nuts, and avocado. Eat less saturated fats like butter, cream, and high-fat meats, like bacon and sausage. Avoid trans fats, which are in all foods that list "partially hydrogenated oil" as an ingredient. What should I drink?  Choose drinks that are not sweetened with sugar. The healthiest choices are water , carbonated or seltzer waters, and tea and coffee without added sugars.  Sweet drinks will make your blood glucose go up very quickly. One serving of soda or energy drink is  cup. It is best to drink these beverages only if your blood glucose is low.  Artificially sweetened, or diet drinks, typically do not increase your blood glucose if they have zero calories in them. Read labels of beverages, as some diet drinks do have carbohydrate and will raise your blood glucose. Label Reading Tips Read Nutrition Facts labels to find out how many grams of carbohydrate are in a food you want to eat. Don't forget: sometimes serving sizes on the label aren't the same as how much food you are going to eat, so you may need to calculate how much carbohydrate is in the food you are serving yourself.   Carbohydrate Counting for People with Diabetes Sample 1-Day Menu  Breakfast  cup yogurt, low fat, low sugar (1 carbohydrate serving)   cup cereal, ready-to-eat, unsweetened (1 carbohydrate serving)  1 cup strawberries (1 carbohydrate serving)   cup almonds ( carbohydrate serving)   Lunch 1, 5 ounce can chunk light tuna  2 ounces cheese, low fat cheddar  6 whole wheat crackers (1 carbohydrate serving)  1 small apple (1 carbohydrate servings)   cup carrots ( carbohydrate serving)   cup snap peas  1 cup 1% milk (1 carbohydrate serving)   Evening Meal Stir fry made with: 3 ounces chicken  1 cup brown rice (3 carbohydrate servings)   cup broccoli ( carbohydrate serving)   cup green beans   cup onions  1 tablespoon olive oil  2 tablespoons teriyaki sauce ( carbohydrate serving)  Evening Snack 1 extra small banana (1 carbohydrate serving)  1 tablespoon peanut butter   Carbohydrate Counting for People with Diabetes Vegan Sample 1-Day Menu  Breakfast 1 cup cooked oatmeal (2 carbohydrate servings)   cup blueberries (1 carbohydrate serving)  2 tablespoons flaxseeds  1 cup soymilk fortified with calcium  and vitamin D  1 cup coffee  Lunch 2 slices whole wheat bread (2 carbohydrate servings)   cup baked tofu   cup lettuce  2 slices tomato  2 slices avocado  cup baby carrots ( carbohydrate serving)  1 orange (1 carbohydrate serving)  1 cup soymilk fortified with calcium  and vitamin D   Evening Meal Burrito made with: 1 6-inch corn tortilla (1 carbohydrate serving)  1 cup refried vegetarian beans (2 carbohydrate servings)   cup chopped tomatoes   cup lettuce   cup salsa  1/3 cup brown rice (1 carbohydrate serving)  1 tablespoon olive oil for rice   cup zucchini   Evening Snack 6 small whole grain crackers (1 carbohydrate serving)  2 apricots ( carbohydrate serving)   cup unsalted peanuts ( carbohydrate serving)    Carbohydrate Counting for People with Diabetes Vegetarian (Lacto-Ovo) Sample 1-Day Menu  Breakfast 1 cup cooked oatmeal (2 carbohydrate servings)   cup blueberries (1 carbohydrate serving)  2 tablespoons flaxseeds  1 egg  1 cup 1% milk (1 carbohydrate serving)  1 cup coffee  Lunch 2 slices whole wheat bread (2 carbohydrate  servings)  2 ounces low-fat cheese   cup lettuce  2 slices tomato  2 slices avocado   cup baby carrots ( carbohydrate serving)  1 orange (1 carbohydrate serving)  1 cup unsweetened tea  Evening Meal Burrito made with: 1 6-inch corn tortilla (1 carbohydrate serving)   cup refried vegetarian beans (1 carbohydrate serving)   cup tomatoes   cup lettuce   cup salsa  1/3 cup brown rice (1 carbohydrate serving)  1 tablespoon olive oil for rice   cup zucchini  1 cup 1% milk (1 carbohydrate serving)  Evening Snack 6 small whole grain crackers (1 carbohydrate serving)  2 apricots ( carbohydrate serving)   cup unsalted peanuts ( carbohydrate serving)    Copyright 2020  Academy of Nutrition and Dietetics. All rights reserved.  Using Nutrition Labels: Carbohydrate  Serving Size  Look at the serving size. All the information on the label is based on this portion. Servings Per Container  The number of servings contained in the package. Guidelines for Carbohydrate  Look at the total grams of carbohydrate in the serving size.  1 carbohydrate choice = 15 grams of carbohydrate. Range of Carbohydrate Grams Per Choice  Carbohydrate Grams/Choice Carbohydrate Choices  6-10   11-20 1  21-25 1  26-35 2  36-40 2  41-50 3  51-55 3  56-65 4  66-70 4  71-80 5    Copyright 2020  Academy of Nutrition and Dietetics. All rights reserved.      -Disability Application Website: [email or web address] ou can also apply:  By phone - Call us  at [phone] from 7 a.m. to 7 p.m. Monday through Friday. If you are deaf or hard of hearing, you can call us  at [facility].  In person - Visit your Social Security Office: Physical Address: [address] [place], [place] [address] Phone: [phone]/TTY: [phone] / Fax:[phone]

## 2023-09-28 NOTE — TOC Initial Note (Signed)
 Transition of Care [facility]) - Initial/Assessment Note   Patient Details  Name: Jeremiah Stevens MRN: [id number] Date of Birth: [birth year]  Transition of Care [facility]) CM/SW Contact:    Ewing Schlein, LCSW Phone Number: 09/28/2023, 9:07 AM  Clinical Narrative: TOC consulted regarding housing as patient resides with sister and would like a different housing situation. CSW spoke with patient and explained that TOC cannot find housing for patient and patient will need to look for alternative housing after discharge. Medicaid transportation for [facility] added to AVS.                Expected Discharge Plan: Home/Self Care Barriers to Discharge: Continued Medical Work up  Patient Goals and CMS Choice Patient states their goals for this hospitalization and ongoing recovery are:: Have a better housing situation Choice offered to / list presented to : NA  Expected Discharge Plan and Services In-house Referral: Clinical Social Work Post Acute Care Choice: NA Living arrangements for the past 2 months: Single Family Home           DME Arranged: N/A DME Agency: NA  Prior Living Arrangements/Services Living arrangements for the past 2 months: Single Family Home Lives with:: Siblings Patient language and need for interpreter reviewed:: Yes Do you feel safe going back to the place where you live?: Yes      Need for Family Participation in Patient Care: No (Comment) Care giver support system in place?: Yes (comment) Criminal Activity/Legal Involvement Pertinent to Current Situation/Hospitalization: No - Comment as needed  Activities of Daily Living ADL Screening (condition at time of admission) Independently performs ADLs?: Yes (appropriate for developmental age) Is the patient deaf or have difficulty hearing?: No Does the patient have difficulty seeing, even when wearing glasses/contacts?: No Does the patient have difficulty concentrating, remembering, or making decisions?: No  Emotional  Assessment Attitude/Demeanor/Rapport: Engaged Affect (typically observed): Appropriate Orientation: : Oriented to Self, Oriented to Place, Oriented to  Time, Oriented to Situation Alcohol / Substance Use: Not Applicable Psych Involvement: No (comment)  Admission diagnosis:  CAP (community acquired pneumonia) [J18.9] Acute hypoxic respiratory failure (HCC) [J96.01] Patient Active Problem List   Diagnosis Date Noted   CAP (community acquired pneumonia) 09/26/2023   Globus sensation 09/26/2023   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   Seizure (HCC) 08/26/2023   Acute respiratory failure with hypoxia (HCC) 08/26/2023   Acute encephalopathy 08/26/2023   Hyperammonemia (HCC) 08/26/2023   Pancytopenia (HCC) 08/26/2023   Hyperosmolar hyperglycemic state (HHS) (HCC) 08/25/2023   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   HIV infection (HCC) 08/03/2022   RLL pneumonia 08/02/2022   AKI (acute kidney injury) (HCC) 08/02/2022   Uncontrolled type 2 diabetes mellitus with hyperglycemia, without long-term current use of insulin (HCC) 08/02/2022   Tobacco abuse 08/02/2022   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   Non-suicidal depressed mood 07/01/2018   Migraine 08/02/2013   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 [place] [address] Phone: [phone] Fax: [phone]  [facility] [place] - [place], [place] - [address] [place] [address] Phone: [phone] Fax: [phone]  [facility]  [facility] - [facility] [address] [place] [address] Phone: [phone] Fax: [phone]  Social Drivers of Health (SDOH) Social History: SDOH Screenings   Food Insecurity: No Food Insecurity (09/27/2023)  Housing: Low Risk  (09/27/2023)  Transportation Needs: Unmet Transportation Needs (09/27/2023)  Utilities: Not At Risk (09/27/2023)  Depression (PHQ2-9): Low Risk  (05/22/2020)  Tobacco Use: High Risk (09/26/2023)   SDOH Interventions: Transportation Interventions: Other (Comment), Inpatient TOC (Medicaid transportation information added to AVS.)  Readmission Risk Interventions     No data to display

## 2023-09-28 NOTE — Inpatient Diabetes Management (Signed)
 Inpatient Diabetes Program Recommendations  AACE/ADA: New Consensus Statement on Inpatient Glycemic Control (2015)  Target Ranges:  Prepandial:   less than 140 mg/dL      Peak postprandial:   less than 180 mg/dL (1-2 hours)      Critically ill patients:  140 - 180 mg/dL   Lab Results  Component Value Date   GLUCAP 143 (H) 09/28/2023   HGBA1C 11.7 (H) 08/26/2023    Review of Glycemic Control  Diabetes history: DM2 Outpatient Diabetes medications: Lantus 45 units QAM Current orders for Inpatient glycemic control: Lantus 20 at bedtime, Novolog 0-9 units Q4H  HgbA1C - 11.7%  Inpatient Diabetes Program Recommendations:   Agree with orders.  Spoke with pt in ED briefly regarding her diabetes and HgbA1C of 11.7%. States it's always high, last appt with PCP/Endo was in August 2024. States she monitors blood sugars with meter "sometimes." Was having trouble talking and very hard to understand.   Spoke with pt about her HgbA1C of 11.7% and goal of 7%. Apparently she was not tuned in to discuss. Pt was knitting and not much eye contact. Will try to speak with her again. Spoke with ED RN regarding visit with pt.   Watch.   Thank you. Ailene Ards, RD, LDN, CDCES Inpatient Diabetes Coordinator [phone]

## 2023-09-29 ENCOUNTER — Inpatient Hospital Stay (HOSPITAL_COMMUNITY)

## 2023-09-29 DIAGNOSIS — I6389 Other cerebral infarction: Secondary | ICD-10-CM | POA: Diagnosis not present

## 2023-09-29 DIAGNOSIS — E119 Type 2 diabetes mellitus without complications: Secondary | ICD-10-CM

## 2023-09-29 DIAGNOSIS — I69391 Dysphagia following cerebral infarction: Secondary | ICD-10-CM

## 2023-09-29 DIAGNOSIS — R569 Unspecified convulsions: Secondary | ICD-10-CM

## 2023-09-29 DIAGNOSIS — B20 Human immunodeficiency virus [HIV] disease: Secondary | ICD-10-CM | POA: Diagnosis not present

## 2023-09-29 DIAGNOSIS — I469 Cardiac arrest, cause unspecified: Secondary | ICD-10-CM | POA: Diagnosis not present

## 2023-09-29 DIAGNOSIS — J189 Pneumonia, unspecified organism: Secondary | ICD-10-CM | POA: Diagnosis not present

## 2023-09-29 DIAGNOSIS — J9601 Acute respiratory failure with hypoxia: Secondary | ICD-10-CM | POA: Diagnosis not present

## 2023-09-29 DIAGNOSIS — J690 Pneumonitis due to inhalation of food and vomit: Secondary | ICD-10-CM

## 2023-09-29 DIAGNOSIS — I639 Cerebral infarction, unspecified: Secondary | ICD-10-CM

## 2023-09-29 DIAGNOSIS — I63541 Cerebral infarction due to unspecified occlusion or stenosis of right cerebellar artery: Secondary | ICD-10-CM | POA: Diagnosis not present

## 2023-09-29 LAB — COMPREHENSIVE METABOLIC PANEL
ALT: 31 U/L (ref 0–44)
AST: 18 U/L (ref 15–41)
Albumin: 3.1 g/dL — ABNORMAL LOW (ref 3.5–5.0)
Alkaline Phosphatase: 71 U/L (ref 38–126)
Anion gap: 11 (ref 5–15)
BUN: 28 mg/dL — ABNORMAL HIGH (ref 6–20)
CO2: 25 mmol/L (ref 22–32)
Calcium: 9 mg/dL (ref 8.9–10.3)
Chloride: 97 mmol/L — ABNORMAL LOW (ref 98–111)
Creatinine, Ser: 0.98 mg/dL (ref 0.61–1.24)
GFR, Estimated: >60 mL/min (ref >60)
Glucose, Bld: 157 mg/dL — ABNORMAL HIGH (ref 70–99)
Potassium: 3.8 mmol/L (ref 3.5–5.1)
Sodium: 133 mmol/L — ABNORMAL LOW (ref 135–145)
Total Bilirubin: 0.6 mg/dL (ref 0.0–1.2)
Total Protein: 9.1 g/dL — ABNORMAL HIGH (ref 6.5–8.1)

## 2023-09-29 LAB — CBC WITH DIFFERENTIAL/PLATELET
Abs Immature Granulocytes: 0.04 10*3/uL (ref 0.00–0.07)
Basophils Absolute: 0 10*3/uL (ref 0.0–0.1)
Basophils Relative: 0 %
Eosinophils Absolute: 0 10*3/uL (ref 0.0–0.5)
Eosinophils Relative: 0 %
HCT: 36.5 % — ABNORMAL LOW (ref 39.0–52.0)
Hemoglobin: 12 g/dL — ABNORMAL LOW (ref 13.0–17.0)
Immature Granulocytes: 1 %
Lymphocytes Relative: 13 %
Lymphs Abs: 0.8 10*3/uL (ref 0.7–4.0)
MCH: 30.1 pg (ref 26.0–34.0)
MCHC: 32.9 g/dL (ref 30.0–36.0)
MCV: 91.5 fL (ref 80.0–100.0)
Monocytes Absolute: 0.7 10*3/uL (ref 0.1–1.0)
Monocytes Relative: 11 %
Neutro Abs: 4.9 10*3/uL (ref 1.7–7.7)
Neutrophils Relative %: 75 %
Platelets: 236 10*3/uL (ref 150–400)
RBC: 3.99 MIL/uL — ABNORMAL LOW (ref 4.22–5.81)
RDW: 15.2 % (ref 11.5–15.5)
WBC: 6.5 10*3/uL (ref 4.0–10.5)
nRBC: 0 % (ref 0.0–0.2)

## 2023-09-29 LAB — ECHOCARDIOGRAM COMPLETE
AR max vel: 2.51 cm2
AV Peak grad: 18.5 mmHg
Ao pk vel: 2.15 m/s
Area-P 1/2: 3.55 cm2
Height: 72 in
MV VTI: 3.27 cm2
S' Lateral: 3 cm
Weight: 4084.68 [oz_av]

## 2023-09-29 LAB — HEMOGLOBIN A1C
Hgb A1c MFr Bld: 10.8 % — ABNORMAL HIGH (ref 4.8–5.6)
Mean Plasma Glucose: 263.26 mg/dL

## 2023-09-29 LAB — BLOOD GAS, ARTERIAL
Acid-Base Excess: 5.3 mmol/L — ABNORMAL HIGH (ref 0.0–2.0)
Bicarbonate: 36 mmol/L — ABNORMAL HIGH (ref 20.0–28.0)
Drawn by: 72603
FIO2: 80 %
MECHVT: 500 mL
O2 Saturation: 98.2 %
PEEP: 5 cmH2O
Patient temperature: 36.5
RATE: 18 {breaths}/min
pCO2 arterial: 84 mmHg (ref 32–48)
pH, Arterial: 7.24 — ABNORMAL LOW (ref 7.35–7.45)
pO2, Arterial: 92 mmHg (ref 83–108)

## 2023-09-29 LAB — LEGIONELLA PNEUMOPHILA SEROGP 1 UR AG: L. pneumophila Serogp 1 Ur Ag: NEGATIVE

## 2023-09-29 LAB — LIPID PANEL
Cholesterol: 175 mg/dL (ref 0–200)
Cholesterol: 161 mg/dL (ref 0–200)
HDL: 29 mg/dL — ABNORMAL LOW (ref >40)
HDL: 28 mg/dL — ABNORMAL LOW (ref >40)
LDL Cholesterol: 104 mg/dL — ABNORMAL HIGH (ref 0–99)
LDL Cholesterol: 95 mg/dL (ref 0–99)
Total CHOL/HDL Ratio: 6 ratio
Total CHOL/HDL Ratio: 5.8 ratio
Triglycerides: 208 mg/dL — ABNORMAL HIGH (ref <150)
Triglycerides: 188 mg/dL — ABNORMAL HIGH (ref <150)
VLDL: 42 mg/dL — ABNORMAL HIGH (ref 0–40)
VLDL: 38 mg/dL (ref 0–40)

## 2023-09-29 LAB — GLUCOSE, CAPILLARY
Glucose-Capillary: 127 mg/dL — ABNORMAL HIGH (ref 70–99)
Glucose-Capillary: 136 mg/dL — ABNORMAL HIGH (ref 70–99)
Glucose-Capillary: 145 mg/dL — ABNORMAL HIGH (ref 70–99)
Glucose-Capillary: 198 mg/dL — ABNORMAL HIGH (ref 70–99)
Glucose-Capillary: 205 mg/dL — ABNORMAL HIGH (ref 70–99)
Glucose-Capillary: 314 mg/dL — ABNORMAL HIGH (ref 70–99)

## 2023-09-29 LAB — CULTURE, RESPIRATORY W GRAM STAIN

## 2023-09-29 LAB — LACTIC ACID, PLASMA: Lactic Acid, Venous: 1.9 mmol/L (ref 0.5–1.9)

## 2023-09-29 LAB — T4, FREE: Free T4: 0.83 ng/dL (ref 0.61–1.12)

## 2023-09-29 LAB — AMMONIA: Ammonia: 30 umol/L (ref 9–35)

## 2023-09-29 MED ORDER — ORAL CARE MOUTH RINSE
15.0000 mL | OROMUCOSAL | Status: DC
  Administered 2023-09-29 – 2023-10-27 (×309): 15 mL via OROMUCOSAL

## 2023-09-29 MED ORDER — FENTANYL 2500MCG IN NS 250ML (10MCG/ML) PREMIX INFUSION
50.0000 ug/h | INTRAVENOUS | Status: DC
  Administered 2023-09-29: 50 ug/h via INTRAVENOUS
  Administered 2023-09-30 (×2): 75 ug/h via INTRAVENOUS
  Filled 2023-09-29 (×3): qty 250

## 2023-09-29 MED ORDER — FUROSEMIDE 10 MG/ML IJ SOLN
40.0000 mg | Freq: Once | INTRAMUSCULAR | Status: AC
  Administered 2023-09-29: 40 mg via INTRAVENOUS

## 2023-09-29 MED ORDER — FENTANYL BOLUS VIA INFUSION
50.0000 ug | INTRAVENOUS | Status: DC | PRN
  Administered 2023-09-29 – 2023-10-01 (×14): 100 ug via INTRAVENOUS

## 2023-09-29 MED ORDER — PROPOFOL 1000 MG/100ML IV EMUL
5.0000 ug/kg/min | INTRAVENOUS | Status: DC
  Administered 2023-09-29: 25 ug/kg/min via INTRAVENOUS
  Administered 2023-09-29: 5 ug/kg/min via INTRAVENOUS
  Administered 2023-09-30 (×3): 20 ug/kg/min via INTRAVENOUS
  Administered 2023-10-01: 10 ug/kg/min via INTRAVENOUS
  Filled 2023-09-29 (×5): qty 100

## 2023-09-29 MED ORDER — SULFAMETHOXAZOLE-TRIMETHOPRIM 800-160 MG PO TABS
1.0000 | ORAL_TABLET | Freq: Every day | ORAL | Status: DC
  Administered 2023-09-30 – 2023-10-02 (×3): 1
  Filled 2023-09-29 (×4): qty 1

## 2023-09-29 MED ORDER — NICOTINE POLACRILEX 2 MG MT GUM
2.0000 mg | CHEWING_GUM | OROMUCOSAL | Status: DC | PRN
Start: 2023-09-29 — End: 2023-09-30
  Filled 2023-09-29: qty 1

## 2023-09-29 MED ORDER — LEVETIRACETAM IN NACL 1500 MG/100ML IV SOLN
3000.0000 mg | INTRAVENOUS | Status: AC
  Administered 2023-09-29: 3000 mg via INTRAVENOUS
  Filled 2023-09-29 (×2): qty 200

## 2023-09-29 MED ORDER — ASPIRIN 81 MG PO CHEW
81.0000 mg | CHEWABLE_TABLET | Freq: Every day | ORAL | Status: DC
  Administered 2023-09-29 – 2023-10-09 (×11): 81 mg
  Filled 2023-09-29 (×11): qty 1

## 2023-09-29 MED ORDER — LEVETIRACETAM IN NACL 500 MG/100ML IV SOLN
500.0000 mg | Freq: Two times a day (BID) | INTRAVENOUS | Status: DC
  Administered 2023-09-30 – 2023-10-05 (×11): 500 mg via INTRAVENOUS
  Filled 2023-09-29 (×11): qty 100

## 2023-09-29 MED ORDER — SODIUM CHLORIDE 0.9 % IV SOLN: 250.0000 mL | INTRAVENOUS | Status: AC

## 2023-09-29 MED ORDER — PROPOFOL 1000 MG/100ML IV EMUL
INTRAVENOUS | Status: AC
  Filled 2023-09-29: qty 100

## 2023-09-29 MED ORDER — METRONIDAZOLE 500 MG/100ML IV SOLN
500.0000 mg | Freq: Two times a day (BID) | INTRAVENOUS | Status: DC
  Administered 2023-09-29 – 2023-10-02 (×6): 500 mg via INTRAVENOUS
  Filled 2023-09-29 (×6): qty 100

## 2023-09-29 MED ORDER — MIDAZOLAM HCL 2 MG/2ML IJ SOLN
2.0000 mg | Freq: Once | INTRAMUSCULAR | Status: AC
  Administered 2023-09-29: 2 mg via INTRAVENOUS

## 2023-09-29 MED ORDER — LEVETIRACETAM IN NACL 500 MG/100ML IV SOLN: 500.0000 mg | Freq: Two times a day (BID) | INTRAVENOUS | Status: DC

## 2023-09-29 MED ORDER — ORAL CARE MOUTH RINSE: 15.0000 mL | OROMUCOSAL | Status: DC | PRN

## 2023-09-29 MED ORDER — IOHEXOL 350 MG/ML SOLN
75.0000 mL | Freq: Once | INTRAVENOUS | Status: AC | PRN
  Administered 2023-09-29: 75 mL via INTRAVENOUS

## 2023-09-29 MED ORDER — FENTANYL CITRATE (PF) 100 MCG/2ML IJ SOLN
100.0000 ug | Freq: Once | INTRAMUSCULAR | Status: AC
  Administered 2023-09-29: 100 ug via INTRAVENOUS

## 2023-09-29 MED ORDER — POLYETHYLENE GLYCOL 3350 17 G PO PACK
17.0000 g | PACK | Freq: Every day | ORAL | Status: DC
  Administered 2023-09-30: 17 g
  Filled 2023-09-29 (×2): qty 1

## 2023-09-29 MED ORDER — ADULT MULTIVITAMIN W/MINERALS CH
1.0000 | ORAL_TABLET | Freq: Every day | ORAL | Status: DC
  Administered 2023-09-30: 1 via ORAL
  Filled 2023-09-29: qty 1

## 2023-09-29 MED ORDER — CLOPIDOGREL BISULFATE 75 MG PO TABS
75.0000 mg | ORAL_TABLET | Freq: Every day | ORAL | Status: DC
  Administered 2023-09-30 – 2023-10-08 (×9): 75 mg
  Filled 2023-09-29 (×9): qty 1

## 2023-09-29 MED ORDER — LACTATED RINGERS IV BOLUS
500.0000 mL | Freq: Once | INTRAVENOUS | Status: AC
  Administered 2023-09-29: 500 mL via INTRAVENOUS

## 2023-09-29 MED ORDER — ATORVASTATIN CALCIUM 10 MG PO TABS
20.0000 mg | ORAL_TABLET | Freq: Every day | ORAL | Status: DC
  Administered 2023-09-30 – 2023-10-12 (×13): 20 mg
  Filled 2023-09-29 (×14): qty 2

## 2023-09-29 MED ORDER — ATORVASTATIN CALCIUM 40 MG PO TABS: 20.0000 mg | ORAL_TABLET | Freq: Every day | ORAL | Status: DC

## 2023-09-29 MED ORDER — FUROSEMIDE 10 MG/ML IJ SOLN
INTRAMUSCULAR | Status: AC
  Filled 2023-09-29: qty 4

## 2023-09-29 MED ORDER — CHLORHEXIDINE GLUCONATE CLOTH 2 % EX PADS
6.0000 | MEDICATED_PAD | Freq: Every day | CUTANEOUS | Status: DC
  Administered 2023-09-29 – 2023-11-09 (×36): 6 via TOPICAL

## 2023-09-29 MED ORDER — LEVETIRACETAM IN NACL 500 MG/100ML IV SOLN
500.0000 mg | Freq: Two times a day (BID) | INTRAVENOUS | Status: DC
  Filled 2023-09-29: qty 100

## 2023-09-29 MED ORDER — HYDROXYZINE HCL 10 MG PO TABS
10.0000 mg | ORAL_TABLET | Freq: Once | ORAL | Status: AC | PRN
  Administered 2023-09-29: 10 mg via ORAL
  Filled 2023-09-29: qty 1

## 2023-09-29 MED ORDER — LEVETIRACETAM IN NACL 1000 MG/100ML IV SOLN
1000.0000 mg | Freq: Once | INTRAVENOUS | Status: DC
  Filled 2023-09-29: qty 100

## 2023-09-29 MED ORDER — SODIUM CHLORIDE 0.9 % IV SOLN
2.0000 g | INTRAVENOUS | Status: AC
  Administered 2023-09-29 – 2023-10-02 (×4): 2 g via INTRAVENOUS
  Filled 2023-09-29 (×3): qty 20

## 2023-09-29 MED ORDER — DOCUSATE SODIUM 50 MG/5ML PO LIQD
100.0000 mg | Freq: Two times a day (BID) | ORAL | Status: DC
  Administered 2023-09-29 – 2023-10-05 (×13): 100 mg
  Filled 2023-09-29 (×14): qty 10

## 2023-09-29 MED ORDER — FENTANYL CITRATE PF 50 MCG/ML IJ SOSY
50.0000 ug | PREFILLED_SYRINGE | Freq: Once | INTRAMUSCULAR | Status: AC
  Administered 2023-09-29: 50 ug via INTRAVENOUS

## 2023-09-29 MED ORDER — NOREPINEPHRINE 4 MG/250ML-% IV SOLN
2.0000 ug/min | INTRAVENOUS | Status: DC
  Administered 2023-09-29: 2 ug/min via INTRAVENOUS
  Administered 2023-09-30: 9 ug/min via INTRAVENOUS
  Filled 2023-09-29: qty 250

## 2023-09-29 MED ORDER — HYDRALAZINE HCL 20 MG/ML IJ SOLN
10.0000 mg | Freq: Four times a day (QID) | INTRAMUSCULAR | Status: DC | PRN
  Administered 2023-10-01 – 2023-10-03 (×3): 10 mg via INTRAVENOUS
  Filled 2023-09-29 (×3): qty 1

## 2023-09-29 MED ORDER — CLOPIDOGREL BISULFATE 75 MG PO TABS: 75.0000 mg | ORAL_TABLET | Freq: Every day | ORAL | Status: DC

## 2023-09-29 MED ORDER — MIDAZOLAM HCL 2 MG/2ML IJ SOLN
1.0000 mg | INTRAMUSCULAR | Status: DC | PRN
  Administered 2023-09-29: 2 mg via INTRAVENOUS
  Filled 2023-09-29: qty 2

## 2023-09-29 MED ORDER — ENSURE MAX PROTEIN PO LIQD
11.0000 [oz_av] | Freq: Two times a day (BID) | ORAL | Status: DC
  Filled 2023-09-29 (×2): qty 330

## 2023-09-29 MED ORDER — LABETALOL HCL 5 MG/ML IV SOLN
10.0000 mg | INTRAVENOUS | Status: DC | PRN
  Administered 2023-10-03 – 2023-10-08 (×2): 10 mg via INTRAVENOUS
  Filled 2023-09-29 (×2): qty 4

## 2023-09-29 MED FILL — Medication: Qty: 1 | Status: AC

## 2023-09-29 NOTE — IPAL (Addendum)
  Interdisciplinary Goals of Care Family Meeting   Date carried out: 09/29/2023  Location of the meeting: Conference room  Member's involved: Family Member or next of kin, Audiological scientist or acting medical decision maker: Pt's sister.    Discussion: We discussed goals of care for Jeremiah Stevens. 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  Discussed concerns with complications including but not limited to anoxic encephalopathy etc. Family not sure how they would like to proceed in the  event of recurrent arrest but do acknowledge that they will need to make those difficult decisions now prior to any emergency occurring. Family to discuss amongst themselves further before deciding on whether to list him as DNR in the event of recurrent/future arrest or to continue as full code.  Code status:   Code Status: Full Code   Disposition: Continue current acute care  Time spent for the meeting: 20 minutes.   ADDENDUM 1750: Family has discussed further and have opted to list code status as DNR. Continue all supportive measures otherwise, just DNR in the event of recurrent arrest.    Rutherford Guys, PA-C  09/29/2023, 5:43 PM

## 2023-09-29 NOTE — Progress Notes (Signed)
   09/29/23 1643  Adult Ventilator Settings  Vent Type Servo i  Humidity HME  Vent Mode (S)  PRVC  Vt Set (S)  500 mL  Set Rate (S)  18 bmp  FiO2 (%) (S)  100 %  I Time 0.91 Sec(s)  PEEP (S)  5 cmH20   Pt intubated, placed on vent.

## 2023-09-29 NOTE — Significant Event (Signed)
 In transport to ICU, patient experienced hypoxia on 100% non-rebreather. BVM utilized in transport. On arrival to room patient had bradycardia and subsequently PEA arrest at 1603. Briefly regained a pulse at 1605 and had bradycardic- arrest at 1606, CPR restarted. Patient intubated and ROSC achieved at 1612. Total of 4 amps Epi and 2 amps Bicarb administered during event. During event 150 mls of vomit suctioned from patients mouth and airway. Patient then had posturing and seizure like rigidity and rhythmic movements of arms and legs at 1616. Critical care and hospitalist at bedside, verbal order for 2 mg. Versed. Sister notified of event. See CPR documentation for full event details.   Clothing had to be cut off during arrest, $80 found in patients bra and was placed in wallet found at bedside and locked in security locker. Key placed in shadow chart.   Lynden Oxford, RN

## 2023-09-29 NOTE — Progress Notes (Signed)
 Patients belongings were returned to patients sister Pierson Vantol, this was witnessed by [employer], $80 in cash, and the patients wallet. Family has also said they will be taking patients purse with them.

## 2023-09-29 NOTE — Progress Notes (Signed)
 1201- IV antibiotic rocephin hung  1215- Pt unhooked IV abx to brush her teeth in the bathroom. Sister arrived and called RN to room. Pt visibly upset and anxious. Discussing with sister if the patient wanted to stay in the hospital to receive care.   Sister spoke with this RN outside of room and voiced concerns about patient having capacity to make medical decisions for herself. MD notified. MD called patients sister. Order placed for psychiatric evaluation.    1300- Pt upset due to food tray not being delivered, anxious about staying in the hospital. Stated she didn't feel well. Vitals obtained, 195/105 (MAP 120), HR 112, RR 23, 99% on RA. MD made aware of BP, new orders placed. Rapid made aware. RRT called and notified.   1330-RN updated MD with concerns about giving patient PO meds due to aspiration. Speech therapist to come and re-evaluate the patient  1400- Speech therapist verbalized concerns about aspiration, patient made NPO with ice chips only. MD and neuro MD aware.   1533- IV rocephin hung again due to patient not receiving any of first dose hung at 1202. Pt stated she was hot, adjusted thermostat down as low as it would go and went to obtain ice packs. At this time, saw RRT in the med room, voiced concerns about the patient, RRT to bedside to deliver scheduled nebulizer.   1541- Nebulizer started by RRT with RN at bedside, O2 found to be in the 60s.Per RRT patient found to be ambulating in room when she entered. Pt positioned in bed to receive neb. O2 saturations were not improving on 7L while neb was being delivered. Rapid called. MD notified.  Chest x-ray ordered, 40mg  of IV lasix given, pt transferred to ICU for further treatment.

## 2023-09-29 NOTE — Progress Notes (Signed)
 PROGRESS NOTE    Jeremiah Stevens  NWG:[id number] DOB: [birth year] DOA: 09/26/2023 PCP: Jeremiah Munson, MD  Chief Complaint  Patient presents with   Dysphagia    Hospital Course:  Jeremiah Stevens is 48 y.o. adult with type 2 diabetes, seizure disorder, untreated HIV, obesity, polysubstance abuse.  She is admitted with pneumonia and hypoxia.  While receiving treatment appeared to have worsening dysphagia and a brain MRI reveals new CVA.  Neurology was consulted and stroke workup was initiated.   Subjective: Bedside RN endorses concerns of ongoing dysphagia, patient is insistent on eating food though she has visible aspiration, and worsening dysphagia. She also appears to be desaturating when ambulating.  On my evaluation patient is angry.  She endorses that someone is stealing her belongings and she would like to discharge home.  We discussed that her stroke workup is ongoing.  We also discussed that she is desaturating with ambulation and appears to require supplemental O2.  Patient very clearly states that she has no interest in staying in the hospital and plans to leave AGAINST MEDICAL ADVICE.  She is able to accurately report back why she is admitted, and she is alert oriented x 3.  She understands the consequences of leaving which include untreated infection, choking, recurrent CVAs, or even death.  After extensive discussion she is insistent she would like to leave AMA.  Patient's sister Jeremiah Stevens, then arrived and was able to talk the patient into staying for further workup.  Her sister reports that she seems far from her baseline and says she is endorsing events that did not occur.  She is requesting the patient be involuntarily committed.  We had extensive discussions regarding this.  We are pursuing a psychiatric consult.   Objective: Vitals:   09/28/23 2054 09/29/23 0021 09/29/23 0152 09/29/23 0421  BP:  (!) 165/76 (!) 159/96 (!) 151/98  Pulse:  89 98 (!) 101  Resp:  15  20   Temp:  98 F (36.7 C)  97.8 F (36.6 C)  TempSrc:    Oral  SpO2: 90% 95%  90%  Weight:      Height:        Intake/Output Summary (Last 24 hours) at 09/29/2023 0811 Last data filed at 09/28/2023 2200 Gross per 24 hour  Intake 416.68 ml  Output --  Net 416.68 ml   Filed Weights   09/27/23 2256  Weight: 115.8 kg    Examination: General exam: Appears agitated. Respiratory system: No work of breathing, symmetric chest wall expansion Cardiovascular system: Refuses cardiovascular exam Neuro: Alert and oriented x3.  Dysarthric  extremities: Symmetric, expected ROM Skin: No rashes, lesions Psychiatry: Patient is irritable and easily agitated.  She is difficult to redirect.  She curses loudly through evaluation.  Assessment & Plan:  Principal Problem:   CAP (community acquired pneumonia) Active Problems:   Human immunodeficiency virus (HIV) disease   HTN (hypertension)   Uncontrolled type 2 diabetes mellitus with hyperglycemia, without long-term current use of insulin (HCC)   Tobacco abuse   Seizure (HCC)   Acute respiratory failure with hypoxia (HCC)   Globus sensation    Acute ischemic CVA - Brain MRI positive for acute right medullary infarct and cytotoxic edema with no hemorrhage or mass effect - Underlying small chronic right cerebellar infarcts, likely also the right PICA territory - Patient is dysarthric, right leg weakness with ataxia. - Neurology consulted - CTA head and neck: Limited by motion artifact.  Advanced for age atherosclerosis of the  dominant left vertebral artery V4 segment and proximal basilar artery with moderate left V4 stenosis. - SLP consulted and endorses ongoing concerns of dysphagia.  Initially started on dysphagia diet but appears to be actively aspirating.  SLP recommending core track feeding tube for now.  Unfortunately given patient's current mental status she is unlikely to tolerate this.  Will continue to reassess. - PT/OT - Echo, ordered  and pending  -- hemoglobin A1c 0.8%, LDL 95 - On aspirin, s add Plavix 75 mg daily for 21 days then aspirin monotherapy.  Start statin. - Continue telemetry -Patient is demonstrating poor decision-making, though is alert and oriented x 3 on my evaluation.  Family reports that she appears far from baseline and does not believe that she has capacity at this time.  Psychiatry evaluation for capacity assessment.  Dysphagia - Secondary to CVA as above - Dysphagia appears to be getting worse, patient is visibly aspirating.  Proceed with n.p.o. for now.  If patient is calmer and more amendable may proceed with cortrack  Acute hypoxic respiratory failure Community-acquired pneumonia - CT scan negative for pulmonary embolism but confirms lower lung opacities suggestive of multifocal pneumonia - Initially concerning for bacterial pneumonia especially in the setting of HIV.  Procalcitonin was reassuring and chest x-ray most consistent with viral etiology - Infectious disease was consulted.  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 - Positive cryptococcal antigen in the blood, has been on fluconazole empirically  Tobacco abuse - Reiterated the importance of cessation  Uncontrolled type 2 diabetes with hyperglycemia with long-term use of insulin - Continue basal/bolus with sliding scale insulin.  Titrate up as tolerated - Hold oral diabetic meds for now -- A1c 10.8%  Seizure disorder - Prior seizure was in the setting  of [facility]HS, neurology felt no need for AEDs at that time - Continue to monitor for now  Dysphagia - Likely secondary to CVA as above  Substance abuse - UDS positive for THC, has been counseled on cessation  TSH low - TSH 0.21, T4 pending  DVT prophylaxis: Heparin   Code Status: Full Code Family Communication:  Discussed directly with patient, and sister, Jeremiah Stevens. Disposition:  Inpatient still hospitalized for stroke work up, severe dysphasia, psych eval. Will discharge to SNF/Rehab when medically stable  Consultants:  Neurology Behavioral Medicine   Procedures:    Antimicrobials:  Anti-infectives (From admission, onward)    Start     Dose/Rate Route Frequency Ordered Stop   09/28/23 1445  fluconazole (DIFLUCAN) IVPB 400 mg        400 mg 100 mL/hr over 120 Minutes Intravenous Daily 09/28/23 1359     09/27/23 1600  cefTRIAXone (ROCEPHIN) 2 g in sodium chloride 0.9 % 100 mL IVPB        2 g 200 mL/hr over 30 Minutes Intravenous Daily 09/27/23 1004     09/27/23 1000  azithromycin (ZITHROMAX) tablet 500 mg  Status:  Discontinued        500 mg Oral Daily 09/26/23 2307 09/27/23 1004   09/27/23 1000  bictegravir-emtricitabine-tenofovir AF (BIKTARVY) 50-200-25 MG per tablet 1 tablet  1 tablet Oral Daily 09/27/23 0004     09/27/23 1000  sulfamethoxazole-trimethoprim (BACTRIM DS) 800-160 MG per tablet 1 tablet        1 tablet Oral Daily 09/27/23 0017     09/27/23 0800  ceFEPIme (MAXIPIME) 2 g in sodium chloride 0.9 % 100 mL IVPB  Status:  Discontinued        2 g 200 mL/hr over 30 Minutes Intravenous Every 8 hours 09/26/23 2333 09/27/23 1004   09/27/23 0000  fluconazole (DIFLUCAN) tablet 400 mg  Status:  Discontinued        400 mg Oral Daily 09/26/23 2352 09/28/23 1359   09/26/23 2300  ceFEPIme (MAXIPIME) 2 g in sodium chloride 0.9 % 100 mL IVPB        2 g 200 mL/hr over 30 Minutes Intravenous  Once 09/26/23 2245 09/26/23 2357   09/26/23 2300  vancomycin (VANCOREADY) IVPB 2000  mg/400 mL        2,000 mg 200 mL/hr over 120 Minutes Intravenous  Once 09/26/23 2245 09/27/23 0305       Data Reviewed: I have personally reviewed following labs and imaging studies CBC: Recent Labs  Lab 09/25/23 0058 09/26/23 2101 09/26/23 2214 09/27/23 0518 09/28/23 0500  WBC 5.6 6.9  --  6.6 6.0  NEUTROABS  --  5.7  --   --  4.5  HGB 11.5* 11.7* 11.6* 11.4* 11.4*  HCT 33.0* 34.6* 34.0* 34.6* 35.2*  MCV 85.7 87.4  --  89.2 91.0  PLT 215 194  --  193 221   Basic Metabolic Panel: Recent Labs  Lab 09/25/23 0058 09/26/23 2101 09/26/23 2214 09/27/23 0518 09/28/23 0500  NA 130* 132* 135 133* 133*  K 3.8 3.9 4.2 4.4 4.9  CL 97* 96* 98 100 98  CO2 21* 28  --  25 25  GLUCOSE 355* 230* 226* 249* 174*  BUN 11 18 17 19  32*  CREATININE 1.18 1.25* 1.20 1.27* 1.24  CALCIUM 9.9 9.4  --  8.8* 9.4  MG  --   --   --  1.9  --   PHOS  --   --   --  4.6  --    GFR: Estimated Creatinine Clearance (by C-G formula based on SCr of 1.24 mg/dL) Male: 78.4 mL/min Male: 96.8 mL/min Liver Function Tests: Recent Labs  Lab 09/25/23 0058 09/26/23 2101 09/27/23 0518 09/28/23 0500  AST 29 24 26  33  ALT 45* 45* 42 39  ALKPHOS 81 78 76 71  BILITOT 0.4 0.7 0.5 1.1  PROT 9.0* 9.3* 9.2* 9.6*  ALBUMIN 3.2* 3.3* 3.1* 3.5   CBG: Recent Labs  Lab 09/28/23 1602 09/28/23 2008 09/29/23 0018 09/29/23 0418 09/29/23 0751  GLUCAP 161* 151* 136* 127* 145*    Recent Results (from the past 240 hours)  Blood culture (routine x 2)     Status: None (Preliminary result)   Collection Time: 09/26/23  9:01 PM   Specimen: BLOOD  Result Value Ref Range Status   Specimen Description   Final    BLOOD LEFT ANTECUBITAL Performed at [facility], [address], [place], [place] [address]    Special Requests   Final    BOTTLES DRAWN AEROBIC AND ANAEROBIC Blood Culture adequate volume Performed at [facility], [address], [place], [place] [address]     Culture   Final    NO GROWTH 3 DAYS Performed at [facility], [address], [place], [place] [address]  Report Status PENDING  Incomplete  Blood culture (routine x 2)     Status: None (Preliminary result)   Collection Time: 09/26/23  9:01 PM   Specimen: BLOOD  Result Value Ref Range Status   Specimen Description   Final    BLOOD RIGHT ANTECUBITAL Performed at [facility], [address], [place], [place] [address]    Special Requests   Final    BOTTLES DRAWN AEROBIC AND ANAEROBIC Blood Culture adequate volume Performed at [facility], [address] [place], [place] [address]    Culture   Final    NO GROWTH 3 DAYS Performed at [facility], [address], [place], [place] [address]    Report Status PENDING  Incomplete  Resp panel by RT-PCR (RSV, Flu A&B, Covid)     Status: None   Collection Time: 09/26/23  9:01 PM   Specimen: Nasal Swab  Result Value Ref Range Status   SARS Coronavirus 2 by RT PCR NEGATIVE NEGATIVE Final    Comment: (NOTE) SARS-CoV-2 target nucleic acids are NOT DETECTED.  The SARS-CoV-2 RNA is generally detectable in upper respiratory specimens during the acute phase of infection. The lowest concentration of SARS-CoV-2 viral copies this assay can detect is 138 copies/mL. A negative result does not preclude SARS-Cov-2 infection and should not be used as the sole basis for treatment or other patient management decisions. A negative result may occur with  improper specimen collection/handling, submission of specimen other than nasopharyngeal swab, presence of viral mutation(s) within the areas targeted by this assay, and inadequate number of viral copies(<138 copies/mL). A negative result must be combined with clinical observations, patient history, and epidemiological information. The expected result is Negative.  Fact Sheet for Patients:  BloggerCourse.com  Fact Sheet for  Healthcare Providers:  SeriousBroker.it  This test is no t yet approved or cleared by the [place] FDA and  has been authorized for detection and/or diagnosis of SARS-CoV-2 by FDA under an Emergency Use Authorization (EUA). This EUA will remain  in effect (meaning this test can be used) for the duration of the COVID-19 declaration under Section 564(b)(1) of the Act, 21 U.S.C.section 360bbb-3(b)(1), unless the authorization is terminated  or revoked sooner.       Influenza A by PCR NEGATIVE NEGATIVE Final   Influenza B by PCR NEGATIVE NEGATIVE Final    Comment: (NOTE) The Xpert Xpress SARS-CoV-2/FLU/RSV plus assay is intended as an aid in the diagnosis of influenza from Nasopharyngeal swab specimens and should not be used as a sole basis for treatment. Nasal washings and aspirates are unacceptable for Xpert Xpress SARS-CoV-2/FLU/RSV testing.  Fact Sheet for Patients: BloggerCourse.com  Fact Sheet for Healthcare Providers: SeriousBroker.it  This test is not yet approved or cleared by the [place] FDA and has been authorized for detection and/or diagnosis of SARS-CoV-2 by FDA under an Emergency Use Authorization (EUA). This EUA will remain in effect (meaning this test can be used) for the duration of the COVID-19 declaration under Section 564(b)(1) of the Act, 21 U.S.C. section 360bbb-3(b)(1), unless the authorization is terminated or revoked.     Resp Syncytial Virus by PCR NEGATIVE NEGATIVE Final    Comment: (NOTE) Fact Sheet for Patients: BloggerCourse.com  Fact Sheet for Healthcare Providers: SeriousBroker.it  This test is not yet approved or cleared by the [place] FDA and has been authorized for detection and/or diagnosis of SARS-CoV-2 by FDA under an Emergency Use Authorization (EUA). This EUA will remain  in effect (meaning this  test can be used) for the duration of the COVID-19 declaration under Section 564(b)(1) of the Act, 21 U.S.C. section 360bbb-3(b)(1), unless the authorization is terminated or revoked.  Performed at [facility], [address], [place], [place] [address]   MRSA Next Gen by PCR, Nasal     Status: None   Collection Time: 09/27/23  8:10 AM   Specimen: Nasal Mucosa; Nasal Swab  Result Value Ref Range Status   MRSA by PCR Next Gen NOT DETECTED NOT DETECTED Final    Comment: (NOTE) The GeneXpert MRSA Assay (FDA approved for NASAL specimens only), is one component of a comprehensive MRSA colonization surveillance program. It is not intended to diagnose MRSA infection nor to guide or monitor treatment for MRSA infections. Test performance is not FDA approved in patients less than 13 years old. Performed at [facility], [address] [place], [place] [address]   Expectorated Sputum Assessment w Gram Stain, Rflx to Resp Cult     Status: None   Collection Time: 09/27/23  9:04 AM   Specimen: Sputum  Result Value Ref Range Status   Specimen Description SPU  Final   Special Requests Immunocompromised  Final   Sputum evaluation   Final    THIS SPECIMEN IS ACCEPTABLE FOR SPUTUM CULTURE Performed at [facility], [address] [place], [place] [address]    Report Status 09/27/2023 FINAL  Final  Culture, Respiratory w Gram Stain     Status: None (Preliminary result)   Collection Time: 09/27/23  9:04 AM   Specimen: Sputum  Result Value Ref Range Status   Specimen Description   Final    SPU Performed at [facility], [address], [place], [place] [address]    Special Requests   Final    Immunocompromised Reflexed from [phone] Performed at [facility], [address] [place], [place] [address]    Gram Stain   Final    ABUNDANT WBC PRESENT,BOTH PMN AND MONONUCLEAR MODERATE SQUAMOUS  EPITHELIAL CELLS PRESENT MODERATE GRAM POSITIVE COCCI IN PAIRS RARE GRAM NEGATIVE RODS FEW GRAM POSITIVE RODS    Culture   Final    CULTURE REINCUBATED FOR BETTER GROWTH Performed at [facility], 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 EXAM: MRI HEAD WITHOUT CONTRAST TECHNIQUE: Multiplanar, multiecho pulse sequences of the brain and surrounding structures were obtained without intravenous contrast. COMPARISON:  Head CT yesterday, and earlier.  Brain MRI 08/27/2023. FINDINGS: Brain: Positive for restricted diffusion in the right lateral medulla near the level of the medullary pyramids series 5, image 8, series 8, image 16. Mild T2 and FLAIR hyperintensity associated with no hemorrhage or mass effect. No other convincing diffusion restriction. But there are small chronic right cerebellar infarcts on the February MRI there. Supratentorial chronic cystic encephalomalacia along the left aspect of the genu of the corpus callosum. And scattered additional periventricular, central and subcortical white matter T2 and FLAIR hyperintensity which was better demonstrated in February. No convincing chronic cerebral blood products on SWI. And deep gray nuclei relatively spared. Vascular: Major intracranial vascular flow voids are stable. Generalized intracranial artery tortuosity, most pronounced in the dominant distal left vertebral artery, basilar. Skull and upper cervical spine: Stable, negative. Sinuses/Orbits: Negative  orbits.  Improved paranasal sinus aeration. Other: Mastoids are clear. IMPRESSION: 1. Positive for Acute Right Medullary Infarct. Cytotoxic edema with no hemorrhage or mass effect. 2. Underlying small chronic right cerebellar infarcts, likely also the right PICA territory. And other stable  chronic cerebral white matter disease, generalized intracranial artery tortuosity. Electronically Signed   By: Odessa Fleming M.D.   On: 09/29/2023 05:34   DG Swallowing Func-Speech Pathology Result Date: 09/28/2023 Table formatting from the original result was not included. Images from the original result were not included. Modified Barium Swallow Study Patient Details Name: Nabil Bubolz MRN: [id number] Date of Birth: 03/19/1976 Today's Date: 09/28/2023 HPI/PMH: HPI: Patient is a 48 y.o. male with PMH: DM-2, seizure disorder, HIV (untreated), obesity, polysubstance abuse, h/o cryptococcal disease. He presented to the [facility] ED on 09/26/23 with c/o of inability to swallow for the past three days with HA, hiccups and drooling. He was tachypneic in ED on RA and placed on oxygen via nasal cannula. DG neck soft tissue was negative for retropharyngeal soft tissue swelling or epiglottic enlargement, CXR portable suggestive of small airway infection/inflammation. CT angio/chest/PE showed Bilateral mid and lower lung airspace opacities concerning for multifocal pneumonia, less likely edema.  Pt with dysarthria and reports new SUDDEN onset of dysphagia x1 week ago and cough x4 days.  MBS indicated. Clinical Impression: Clinical Impression: Patient presents with mild oral and moderately severe pharyngeal phase dysphagia.  Patient demonstrates significant difficulty initiating swallow - with liquids filling pyriform sinus prior to swallow initiation - for up to 4 seconds with boluses filling pyrifom sinus.  Pt demonstrates penetration of liquids prior to swallow initiation x1 as barium spills into open airway before the swallow. However mostly aspiration is occuring AFTER the swallow as retention adheres to significant pharyngeal secretionsand spills into airway post-swallow.  She is clearing approx 75-85% of boluses with swallowing - however post-swallow retention is aspirated.  A multitude of compensation strategies attempted  including head turn right *where pt senses retention* and left as well as chin tuck with head turn left did not improve clearance.  Dry swallows helps with clearance but are very difficult for pt to conduct.  PES opening was inadequate allowing retention in pyriform sinus.   Pt is aspirating secretions chronically.  She reports desire to eat - regardless of dysphagia.  Defer to MD for po option with mitigation strategies.   Pt may aspirate less frequently with full liquid - nectar at this time and allow thin water between meals. Factors that may increase risk of adverse event in presence of aspiration Rubye Oaks & Clearance Coots 2021): Factors that may increase risk of adverse event in presence of aspiration Rubye Oaks & Clearance Coots 2021): Aspiration of thick, dense, and/or acidic materials (poor secretion management) Recommendations/Plan: Swallowing Evaluation Recommendations Swallowing Evaluation Recommendations Recommendations: -- (defer to MD, but pt wants to eat) Medication Administration: Other (Comment) Treatment Plan Treatment Plan Treatment recommendations: Therapy as outlined in treatment plan below Follow-up recommendations: Follow physicians's recommendations for discharge plan and follow up therapies Functional status assessment: Patient has had a recent decline in their functional status and demonstrates the ability to make significant improvements in function in a reasonable and predictable amount of time. Treatment frequency: Min 2x/week Treatment duration: 1 week Interventions: Aspiration precaution training; Compensatory techniques; Patient/family education; Trials of upgraded texture/liquids Recommendations Recommendations for follow up therapy are one component of a multi-disciplinary discharge planning process, led by the attending physician.  Recommendations may be updated based on patient status, additional functional  criteria and insurance authorization. Assessment: Orofacial Exam: Orofacial Exam Oral Cavity:  Oral Hygiene: Pooled secretions (needing frequent oral suctioning) Oral Cavity - Dentition: Adequate natural dentition Orofacial Anatomy: WFL Oral Motor/Sensory Function: Suspected cranial nerve impairment CN V - Trigeminal: WFL CN VII - Facial: WFL CN IX - Glossopharyngeal, CN X - Vagus: -- (posterior tongue appears elevated on right compared to left with mouth opening) CN XII - Hypoglossal: -- (pt is dysarthric, subjectively appears with right sided weakness more than left- with pushing on right) Anatomy: Anatomy: Other (Comment) (? appearance of unilateral bulging on the right -) Boluses Administered: Boluses Administered Boluses Administered: Thin liquids (Level 0); Mildly thick liquids (Level 2, nectar thick); Moderately thick liquids (Level 3, honey thick); Puree; Solid  Oral Impairment Domain: Oral Impairment Domain Lip Closure: No labial escape Tongue control during bolus hold: Posterior escape of less than half of bolus Bolus preparation/mastication: Slow prolonged chewing/mashing with complete recollection Bolus transport/lingual motion: Brisk tongue motion Oral residue: Trace residue lining oral structures Location of oral residue : Tongue Initiation of pharyngeal swallow : Pyriform sinuses  Pharyngeal Impairment Domain: Pharyngeal Impairment Domain Soft palate elevation: No bolus between soft palate (SP)/pharyngeal wall (PW) Laryngeal elevation: Partial superior movement of thyroid cartilage/partial approximation of arytenoids to epiglottic petiole Anterior hyoid excursion: Partial anterior movement Epiglottic movement: Partial inversion Laryngeal vestibule closure: Incomplete, narrow column air/contrast in laryngeal vestibule Pharyngeal stripping wave : Present - diminished Pharyngeal contraction (A/P view only): Unilateral bulging Pharyngoesophageal segment opening: Partial distention/partial duration, partial obstruction of flow Tongue base retraction: Narrow column of contrast or air between tongue  base and PPW Pharyngeal residue: Collection of residue within or on pharyngeal structures; Trace residue within or on pharyngeal structures Location of pharyngeal residue: Valleculae; Pyriform sinuses; Aryepiglottic folds; Tongue base  Esophageal Impairment Domain: No data recorded Pill: Pill Consistency administered: -- (DNT) Penetration/Aspiration Scale Score: Penetration/Aspiration Scale Score 1.  Material does not enter airway: Solid 2.  Material enters airway, remains ABOVE vocal cords then ejected out: Puree 5.  Material enters airway, CONTACTS cords and not ejected out: Moderately thick liquids (Level 3, honey thick) 8.  Material enters airway, passes BELOW cords without attempt by patient to eject out (silent aspiration) : Thin liquids (Level 0); Mildly thick liquids (Level 2, nectar thick) Compensatory Strategies: Compensatory Strategies Compensatory strategies: Yes Effortful swallow: Ineffective Ineffective Effortful Swallow: Mildly thick liquid (Level 2, nectar thick); Thin liquid (Level 0) Multiple swallows: Effective Effective Multiple Swallows: Thin liquid (Level 0); Mildly thick liquid (Level 2, nectar thick); Moderately thick liquid (Level 3, honey thick); Puree; Solid (but difficult for pt to perform) Chin tuck: Ineffective Ineffective Chin Tuck: Thin liquid (Level 0); Mildly thick liquid (Level 2, nectar thick) Left head turn: Ineffective Ineffective Left Head Turn: Mildly thick liquid (Level 2, nectar thick); Thin liquid (Level 0) Right head turn: Ineffective Ineffective Right Head Turn: Thin liquid (Level 0); Mildly thick liquid (Level 2, nectar thick) Posterior head tilt: Ineffective Ineffective Posterior head tilt: Moderately thick liquid (Level 3, honey thick); Mildly thick liquid (Level 2, nectar thick) Chin tuck combined with head turn: Ineffective Ineffective Chin tuck combined with head turn: Mildly thick liquid (Level 2, nectar thick)   General Information: Caregiver present: No  Diet  Prior to this Study: NPO   Temperature : Normal   Respiratory Status: WFL (pt coughing on secretions)   Supplemental O2: None (Room air)   History of Recent Intubation: No  Behavior/Cognition: Alert; Cooperative; Pleasant mood Self-Feeding Abilities: Able to  self-feed Baseline vocal quality/speech: Abnormal resonance Volitional Cough: Able to elicit Volitional Swallow: -- (with effort, dry swallows are challenging for pt to conduct) Exam Limitations: No limitations Goal Planning: Prognosis for improved oropharyngeal function: Fair No data recorded No data recorded Patient/Family Stated Goal: pt wants to eat Consulted and agree with results and recommendations: Patient Pain: Pain Assessment Pain Assessment: No/denies pain End of Session: Start Time:SLP Start Time (ACUTE ONLY): 1400 Stop Time: SLP Stop Time (ACUTE ONLY): 1445 Time Calculation:SLP Time Calculation (min) (ACUTE ONLY): 45 min Charges: SLP Evaluations $ SLP Speech Visit: 1 Visit SLP Evaluations $BSS Swallow: 1 Procedure $MBS Swallow: 1 Procedure $Swallowing Treatment: 1 Procedure SLP visit diagnosis: SLP Visit Diagnosis: Dysphagia, pharyngoesophageal phase (R13.14); Dysphagia, oropharyngeal phase (R13.12) Past Medical History: Past Medical History: Diagnosis Date  Cancer (HCC)   seizures  Diabetes (HCC)   Diabetes mellitus without complication (HCC)   Heartburn   occasional; OTC as needed  Herpes genitalis in men   HIV (human immunodeficiency virus infection) (HCC)   HIV disease (HCC)   HTN (hypertension)   Hyperlipidemia   Hypertension   under control with med., has been on med. x 1 yr.  Lateral malleolar fracture 09/02/2013  left  Migraines   Tear of deltoid ligament of left ankle 09/02/2013  Type 2 diabetes mellitus with hyperosmolar nonketotic hyperglycemia (HCC) 02/10/2020 Past Surgical History: Past Surgical History: Procedure Laterality Date  NO PAST SURGERIES    ORIF ANKLE FRACTURE Left 09/13/2013  Procedure: OPEN REDUCTION INTERNAL FIXATION (ORIF)  LEFT LATERAL MALLEOLUS ANKLE FRACTURE ;  Surgeon: Dannielle Huh, MD;  Location: [facility];  Service: Orthopedics;  Laterality: Left; A-P view Rolena Infante, MS [facility] SLP Acute Rehab Services Office [phone] Chales Abrahams 09/28/2023, 4:16 PM  CT HEAD WO CONTRAST ( ) Result Date: 09/28/2023 CLINICAL DATA:  48 year old male with increased right side weakness, garbled speech, confusion. Neurologic deficit. Aspiration. EXAM: CT HEAD WITHOUT CONTRAST TECHNIQUE: Contiguous axial images were obtained from the base of the skull through the vertex without intravenous contrast. RADIATION DOSE REDUCTION: This exam was performed according to the departmental dose-optimization program which includes automated exposure control, adjustment of the mA and/or kV according to patient size and/or use of iterative reconstruction technique. COMPARISON:  Brain MRI 08/27/2023.  Head CT 09/25/2023. FINDINGS: Brain: Study is mildly degraded by motion artifact despite repeated imaging attempts. Normal cerebral volume. No midline shift, ventriculomegaly, mass effect, evidence of mass lesion, intracranial hemorrhage or evidence of cortically based acute infarction. Gray-white matter differentiation is within normal limits throughout the brain. Vascular: No suspicious intracranial vascular hyperdensity. Skull: Intact.  No acute osseous abnormality identified. Sinuses/Orbits: Visualized paranasal sinuses and mastoids are stable and well aerated. Other: Chronic Disconjugate gaze. Visualized scalp soft tissues are within normal limits. IMPRESSION: Stable and negative noncontrast CT appearance of the brain when allowing for mildly motion degraded exam. Electronically Signed   By: Odessa Fleming M.D.   On: 09/28/2023 13:58    Scheduled Meds:   stroke: early stages of recovery book   Does not apply Once   aspirin EC  81 mg Oral Daily   bictegravir-emtricitabine-tenofovir AF  1 tablet Oral Daily   diltiazem  360 mg Oral Daily    guaiFENesin  600 mg Oral BID   insulin aspart  0-9 Units Subcutaneous Q4H   insulin glargine  20 Units Subcutaneous QHS   ipratropium-albuterol  3 mL Nebulization QID   pneumococcal 20-valent conjugate vaccine  0.5 mL Intramuscular Tomorrow-1000   sulfamethoxazole-trimethoprim  1 tablet Oral Daily   Continuous Infusions:  cefTRIAXone (ROCEPHIN)  IV 2 g (09/28/23 1130)   fluconazole (DIFLUCAN) IV 400 mg (09/28/23 1631)     LOS: 3 days  MDM: Patient is high risk for one or more organ failure.  They necessitate ongoing hospitalization for continued IV therapies and subsequent lab monitoring. Total time spent interpreting labs and vitals, coordinating care amongst consultants and care team members, directly assessing and discussing care with the patient and/or family: 55 min    Debarah Crape, DO [facility]  To contact the attending physician between 7A-7P please use Epic Chat. To contact the covering physician during after hours 7P-7A, please review Amion.   09/29/2023, 8:11 AM   *This document has been created with the assistance of dictation software. Please excuse typographical errors. *

## 2023-09-29 NOTE — Progress Notes (Signed)
 Ceribell initiated per orders at 1747. Critical care team notified of initiation. Patient being stabilized prior to transfer to [facility] for closer neurological monitoring post seizure. See Rapid response note for full details regarding admission to ICU unit.   Patient is tolerating ventilator well at this time; VS stable. Keppra currently infusing as ordered by physician. All ordered labs sent.

## 2023-09-29 NOTE — Progress Notes (Signed)
 Patient identified as a high risk for falls, Pt refuses bed alarm at this time. RN educated patient on the risks of falling and informed patient the bed alarm was all in efforts to keep the patient safe. Patient responded, "I know when I can't walk and I am telling you right now I can walk."

## 2023-09-29 NOTE — Plan of Care (Incomplete)
 Brief plan of care note.  Briefly,Jeremiah Stevens is a 48 year old adult with history of HIV, diabetes, hypertension, current everyday smoker who presents with dysphagia with no pain, slight right facial droop and right leg weakness and ataxia.  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  Suspected the underlying etiology of her strokes is small vessel disease vs large artery atherosclerosis secondary to poorly controlled stroke risk factors. Full stroke workup is pending.   Plan: - Frequent Neuro checks per stroke unit protocol - TTE pending.  - LDL slightly greater than 70, will start Atorvastatin 20mg . - HbA1c is pending. - Antithrombotic -aspirin 81 mg daily along with Plavix 75 mg daily for 21 days, followed by aspirin 81 mg daily alone. - Recommend DVT ppx - SBP goal - aim for gradual normotension. - Recommend Telemetry monitoring for arrythmia - Recommend bedside swallow screen prior to PO intake. - Stroke education booklet - Recommend PT/OT/SLP consult   Erick Blinks [facility]

## 2023-09-29 NOTE — Procedures (Signed)
 Intubation Procedure Note  Jeremiah Stevens  [id number]  [birth year]  Date:09/29/23  Time:4:40 PM   Provider Performing:Sonni Barse Celine Mans    Procedure: Intubation (31500)  Indication(s) Respiratory Failure  Consent Unable to obtain consent due to emergent nature of procedure.   Anesthesia None, code blue    Time Out Verified patient identification, verified procedure, site/side was marked, verified correct patient position, special equipment/implants available, medications/allergies/relevant history reviewed, required imaging and test results available.   Sterile Technique Usual hand hygeine, masks, and gloves were used   Procedure Description Patient positioned in bed supine.  Sedation given as noted above.  Patient was intubated with endotracheal tube using Glidescope.  View was Grade 3 only epiglottis .  Number of attempts was  2 .  Colorimetric CO2 detector was consistent with tracheal placement. No visualization at all on initial attempt, only emesis and food debris throughout. Attempted to suction and could only then visualize top of epiglottis. Attempted intubation but likely intubated esophagus. Bagged pt more, suctioned aggressively and then re-attempted for 2nd time. More of epiglottis visible but lots of food debris in oropharynx and blurring glidescope screen. Did eventually manipulate laryngoscope some and managed to visualize cords then passed ETT successfully into trachea.   Complications/Tolerance None; patient tolerated the procedure well. Chest X-ray is ordered to verify placement.   EBL Minimal   Specimen(s) None   Rutherford Guys, PA - [facility] For pager details, please see AMION or use Epic chat  After 1900, please call [facility] for cross coverage needs 09/29/2023, 4:42 PM

## 2023-09-29 NOTE — Plan of Care (Signed)
  Problem: Coping: Goal: Ability to adjust to condition or change in health will improve Outcome: Progressing   Problem: Metabolic: Goal: Ability to maintain appropriate glucose levels will improve Outcome: Progressing   Problem: Clinical Measurements: Goal: Respiratory complications will improve Outcome: Progressing Goal: Cardiovascular complication will be avoided Outcome: Progressing   Problem: Education: Goal: Knowledge of disease or condition will improve Outcome: Progressing

## 2023-09-29 NOTE — Significant Event (Signed)
 Rapid response called on this patient for hypoxia after presumed aspiration event.  On arrival to the room patient has diffuse bilateral crackles.  She was saturating mid 80s on a nonrebreather.   Stat chest x-ray was ordered which showed diffuse vascular congestion.  40 mg IV push Lasix was ordered and given.  Patient was alert and trying to pull off nonrebreather.  Decision was made promptly to transfer patient to ICU as her work of breathing was increasing. While in transit to the ICU patient's O2 sats began to decline to the 60s.  We used Ambu bag to increase saturations while in transit. Not long after arrival to ICU we lost pulse.  CODE BLUE was called ICU PA-C present at bedside and ran code.  Patient received 4 rounds of epinephrine, 2 amp of bicarb.  After 9 minutes ROSC was achieved.  Patient was successfully intubated and sats rose to 100%. Please see ICU PA note for code details.  Sister, [name] was updated during the code and after.  Patient's father also updated after. Intensivist, Dr. Isaiah Serge will resume care of this patient moving forward. Direct hand off was given.     09/29/2023    4:26 PM 09/29/2023    4:24 PM 09/29/2023    3:00 PM  Vitals with BMI  Systolic 165  166  Diastolic 91  98  Pulse 117 118 112          CRITICAL CARE Performed by: Debarah Crape Total critical care time: 35 minutes Critical care time was exclusive of separately billable procedures and treating other patients. Critical care was necessary to treat or prevent imminent or life-threatening deterioration. Critical care was time spent personally by me on the following activities: development of treatment plan with patient and/or surrogate as well as nursing, discussions with consultants, evaluation of patient's response to treatment, examination of patient, obtaining history from patient or surrogate, ordering and performing treatments and interventions, ordering and review of laboratory studies,  ordering and review of radiographic studies, pulse oximetry and re-evaluation of patient's condition.

## 2023-09-29 NOTE — Progress Notes (Signed)
 Pt on a full liquid diet has ordered and obtained [employer] pizza delivery. Educated pt about concerns of eating this food and risk of aspiration. Pt states "I don't care, I have to eat". Pt is continuously coughing while attempting to eat ordered food. Continued to educate pt without success.

## 2023-09-29 NOTE — Progress Notes (Signed)
 Speech Language Pathology Treatment: Dysphagia  Patient Details Name: Jeremiah Stevens MRN: [id number] DOB: 12-30-1975 Today's Date: 09/29/2023 Time: 1431-1450 SLP Time Calculation (min) (ACUTE ONLY): 19 min  Assessment / Plan / Recommendation Clinical Impression  Patient seen today for dysphagia management notes she has had a lateral medullary CVA which is the cause of her dysphagia. Upon entrance to room patient was eating her lunch with copious coughing and expectoration using oral suction.  RN reports has congested lung sounds.    SLP was hopeful that pt could tolerate a full liquid nectar thick diet but she clearly is not managing this and at this point is aspirating even her secretions due to her medullary CVA. Now that source of her dysphagia has been determined, level of deficits coincide with her diagnosis.   Explained to patient that she is overtly aspirating and her risk of pulmonary compromise including potential airway obstruction and death were reviewed with patient. Pt reports understanding to information but uncertain if she understands fully this level of concern.    Advised consideration for alternative means of nutrition and follow-up for repeat MBS as patient clinically improves. Patient states she will only agree to this if Dr. Ilsa Iha reports it is necessary. SLP advised neurologist would be optimal to make that call given this is a neurological event. Messaged neurologist who agrees patient needs alternative means of nutrition, relayed said information to pt.  Pt states she would be agreeable if the neurologist said a Cortrak was necessary.   Note patient tried to leave AMA this a.m. and her ability to comprehend her dysphagia appears compromised. Hospitalist agreeable to making patient n.p.o. except single ice chips.    Understand concerns for pt to leave AMA if Cortrak is placed.    Will follow-up for dysphagia management at this point and Will defer cognitive  linguistic evaluation until next date as patient appears frustrated with current situation and this may negatively impact results. Educated patient using teach back and spoke to Charity fundraiser.     HPI HPI: Patient is a 48 y.o. male with PMH: DM-2, seizure disorder, HIV (untreated), obesity, polysubstance abuse, h/o cryptococcal disease. He presented to the [facility] ED on 09/26/23 with c/o of inability to swallow for the past three days with HA, hiccups and drooling. He was tachypneic in ED on RA and placed on oxygen via nasal cannula. DG neck soft tissue was negative for retropharyngeal soft tissue swelling or epiglottic enlargement, CXR portable suggestive of small airway infection/inflammation. CT angio/chest/PE showed Bilateral mid and lower lung airspace opacities concerning for multifocal pneumonia, less likely edema.  Pt with dysarthria and reports new SUDDEN onset of dysphagia x1 week ago and cough x4 days.  MBS indicated. Pt found to have a medullary CVA on MRI.  Follow up for dysphagia management indicated.      SLP Plan  Continue with current plan of care      Recommendations for follow up therapy are one component of a multi-disciplinary discharge planning process, led by the attending physician.  Recommendations may be updated based on patient status, additional functional criteria and insurance authorization.    Recommendations  Diet recommendations: NPO (ice chips)                  Oral care BID     Dysphagia, oropharyngeal phase (R13.12)     Continue with current plan of care    Rolena Infante, MS [facility] SLP Acute Rehab Services Office [phone]  Jeremiah Stevens  09/29/2023, 2:53 PM

## 2023-09-29 NOTE — Consult Note (Signed)
 NAME:  Jeremiah Stevens, MRN:  [id number], DOB:  1975/10/22, LOS: 3 ADMISSION DATE:  09/26/2023, CONSULTATION DATE:  09/29/23 REFERRING MD:  Jeremiah Stevens CHIEF COMPLAINT:  Hypoxia   History of Present Illness:  Pt is encephelopathic; therefore, this HPI is obtained from chart review. Jeremiah Stevens is a 48 y.o. adult who has a PMH as below including but not limited to DM2, seizures, HIV AIDS untreated, cryptococcal disease, obesity, polysubstance abuse, prior CVA. She was admitted 09/26/23 with dysphagia x 3 days along with headaches and drooling. He was found to be hypoxic to 80s on room air.  CTA was negative for PE but showed multifocal PNA. He was started on abx and BD's. Biktarvy was continued and ID was consulted.   3/18 He had right facial droop, right sided weakness, dysarthria. Neurology was consulted and recommended CTA and MRI. MRI demonstrated acute right medullary infarct.  Despite ongoing dysphagia (MBS showed moderately severe pharyngeal phase dysphagia and imaging showed unilateral bulging on right), pt insisted on eating. He reportedly ordered pizza overnight 3/18 and certainly aspirated. He had threatened to leave AMA despite stroke workup ongoing.  3/19, he had hypoxia for which rapid response was called. While being assess, he continued to desaturate down into the 60s and then 40s. He required BVM and was transferred to the ICU for further evaluation and management. Upon arrival in the ICU, she had ongoing desaturations and bradycardia before developing PEA arrest.  She had 9 minutes ACLS prior to ROSC including epi x 4 and bicarb x 2. During intubation attempt, she had copious food material and emesis in her oropharynx. Visualization of vocal cords was difficult due to amount of food material and emesis despite suctioning. On 2nd attempt, ETT was passed successfully and saturations improved shortly thereafter. There was grand mal seizure activity noted after intubation that  abated after Midazolam administration.  Pertinent  Medical History:  has Human immunodeficiency virus (HIV) disease; HTN (hypertension); Hypertriglyceridemia; Herpes; Migraine; HIV-1 associated autonomic neuropathy (HCC); Non-suicidal depressed mood; Gender dysphoria; RLL pneumonia; AKI (acute kidney injury) (HCC); Uncontrolled type 2 diabetes mellitus with hyperglycemia, without long-term current use of insulin (HCC); Tobacco abuse; HIV infection (HCC); Positive RPR test; Hyperosmolar hyperglycemic state (HHS) (HCC); Seizure (HCC); Acute hypoxic respiratory failure (HCC); Acute encephalopathy; Hyperammonemia (HCC); Pancytopenia (HCC); Cryptococcosis (HCC); CAP (community acquired pneumonia); Globus sensation; Aspiration pneumonia of both lungs due to vomit [facility]); and Cardiac arrest, cause unspecified (HCC) on their problem list.  Significant Hospital Events: Including procedures, antibiotic start and stop dates in addition to other pertinent events   3/16 admit 3/18 neuro consult, found to have acute CVA 3/19 ongoing aspiration leading to hypoxia and PEA arrest.  Interim History / Subjective:  Just intubated. Copious food material and emesis in oropharynx.  Objective:  Blood pressure 130/80, pulse (!) 127, temperature 97.7 F (36.5 C), temperature source Oral, resp. rate 18, height 6' (1.829 m), weight 116.4 kg, SpO2 94%.    Vent Mode: PRVC FiO2 (%):  [100 %] 100 % Set Rate:  [18 bmp] 18 bmp Vt Set:  [500 mL] 500 mL PEEP:  [5 cmH20] 5 cmH20 Plateau Pressure:  [18 cmH20] 18 cmH20   Intake/Output Summary (Last 24 hours) at 09/29/2023 1726 Last data filed at 09/28/2023 2200 Gross per 24 hour  Intake 416.68 ml  Output --  Net 416.68 ml   Filed Weights   09/27/23 2256 09/29/23 1655  Weight: 115.8 kg 116.4 kg    Examination: General: Adult male, critically  ill. Neuro: Sedated, not responsive. HEENT: [place]/AT. Sclerae anicteric. ETT in place. Copious emesis and secretions with food  material suctioned. Cardiovascular: RRR, no M/R/G.  Lungs: Respirations even and unlabored.  CTA bilaterally, No W/R/R. Abdomen: BS x 4, soft, NT/ND.  Musculoskeletal: No gross deformities, no edema.  Skin: Intact, warm, no rashes.  Labs/imaging personally reviewed:  CTA chest 3/16 > no PE, multifocal PNA. CT head 3/18 > neg. MRI brain 3/19 > acute right medullary infarct, cytotoxic edema with no hemorrhage or mass effect, small chronic right cerebellar infarcts. CTA head/neck 3/19 > no LVO, advanced for age atherosclerosis of L vertebral artery and prox basilar artery, possible mod L V4 stenosis.  Assessment & Plan:   Acute hypoxic respiratory failure leading to PEA arrest - 2/2 massive aspiration. Required 9 minutes ACLS prior to ROSC and difficult intubation due to copious food material and secretions/emesis in oropharynx making visualization technically challenging. - Full vent support. - Start Metronidazole in addition to Ceftriaxone (PCN allergy with anaphylaxis noted). - Get trach aspirate. - Follow cultures. - Bronchial hygiene. - CXR intermittently.  PEA arrest - as above. - Avoid fevers, if starts to spike then place cooling pads and plan for TTM normothermia. - Supportive care.  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 - Neurology following. - Continue ASA, Plavix, Statin. - PT/OT depending on neurologic recovery after PEA arrest.  Dysphagia - exacerbated by acute CVA. - NPO. - SLP depending on neurologic recovery after PEA arrest.  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 Hx cryptococcal disease. - Continue Biktarvy once able to take PO again. - ID following. - Continue Fluconazole, Bactrim.  Hx HTN. - D/c Diltiazem.  Hx DM2 uncontrolled. - SSI.  Hx seizures - not previously on AED's. Noted after intubation, ? Related to hypoxic insult. - Start Keppra empirically. - Start Ceribel. - Will notify neurology.  ? Hyperthyroidism - TSH low at 0.21, T3/T4 pending. -  F/u on T3, T4.  Hx tobacco dependence, THC dependence. - Cessation counseling when able.  Best practice (evaluated daily):  Diet/type: NPO DVT prophylaxis: SCD Pressure ulcer(s): pressure ulcer assessment deferred  GI prophylaxis: PPI Lines: N/A Foley:  N/A Code Status:  full code Last date of multidisciplinary goals of care discussion: None yet.  Labs   CBC: Recent Labs  Lab 09/25/23 0058 09/26/23 2101 09/26/23 2214 09/27/23 0518 09/28/23 0500 09/29/23 0846  WBC 5.6 6.9  --  6.6 6.0 6.5  NEUTROABS  --  5.7  --   --  4.5 4.9  HGB 11.5* 11.7* 11.6* 11.4* 11.4* 12.0*  HCT 33.0* 34.6* 34.0* 34.6* 35.2* 36.5*  MCV 85.7 87.4  --  89.2 91.0 91.5  PLT 215 194  --  193 221 236    Basic Metabolic Panel: Recent Labs  Lab 09/25/23 0058 09/26/23 2101 09/26/23 2214 09/27/23 0518 09/28/23 0500 09/29/23 0846  NA 130* 132* 135 133* 133* 133*  K 3.8 3.9 4.2 4.4 4.9 3.8  CL 97* 96* 98 100 98 97*  CO2 21* 28  --  25 25 25   GLUCOSE 355* 230* 226* 249* 174* 157*  BUN 11 18 17 19  32* 28*  CREATININE 1.18 1.25* 1.20 1.27* 1.24 0.98  CALCIUM 9.9 9.4  --  8.8* 9.4 9.0  MG  --   --   --  1.9  --   --   PHOS  --   --   --  4.6  --   --    GFR: Estimated Creatinine Clearance (  by C-G formula based on SCr of 0.98 mg/dL) Male: 284.1 mL/min Male: 122.7 mL/min Recent Labs  Lab 09/26/23 2101 09/26/23 2333 09/27/23 0102 09/27/23 0518 09/27/23 0532 09/28/23 0500 09/29/23 0846  PROCALCITON  --   --   --  0.25  --   --   --   WBC 6.9  --   --  6.6  --  6.0 6.5  LATICACIDVEN  --  0.5 0.8  --  1.4  --   --     Liver Function Tests: Recent Labs  Lab 09/25/23 0058 09/26/23 2101 09/27/23 0518 09/28/23 0500 09/29/23 0846  AST 29 24 26  33 18  ALT 45* 45* 42 39 31  ALKPHOS 81 78 76 71 71  BILITOT 0.4 0.7 0.5 1.1 0.6  PROT 9.0* 9.3* 9.2* 9.6* 9.1*  ALBUMIN 3.2* 3.3* 3.1* 3.5 3.1*   No results for input(s): "LIPASE", "AMYLASE" in the last 168 hours. No results for input(s):  "AMMONIA" in the last 168 hours.  ABG    Component Value Date/Time   PHART 7.35 08/02/2022 2010   PCO2ART 44 08/02/2022 2010   PO2ART 34 (LL) 08/02/2022 2010   HCO3 36.4 (H) 09/26/2023 2101   TCO2 31 09/26/2023 2214   ACIDBASEDEF 8.1 (H) 08/25/2023 1920   O2SAT 42.2 09/26/2023 2101     Coagulation Profile: Recent Labs  Lab 09/26/23 2101  INR 1.0    Cardiac Enzymes: Recent Labs  Lab 09/27/23 0518  CKTOTAL 67    HbA1C: Hgb A1c MFr Bld  Date/Time Value Ref Range Status  09/29/2023 08:46 AM 10.8 (H) 4.8 - 5.6 % Final    Comment:    (NOTE) Pre diabetes:          5.7%-6.4%  Diabetes:              >6.4%  Glycemic control for   <7.0% adults with diabetes   08/26/2023 03:28 AM 11.7 (H) 4.8 - 5.6 % Final    Comment:    (NOTE) Pre diabetes:          5.7%-6.4%  Diabetes:              >6.4%  Glycemic control for   <7.0% adults with diabetes     CBG: Recent Labs  Lab 09/29/23 0018 09/29/23 0418 09/29/23 0751 09/29/23 1204 09/29/23 1644  GLUCAP 136* 127* 145* 198* 314*    Review of Systems:   Unable to obtain as pt is encephalopathic.  Past Medical History:  She,  has a past medical history of Cancer (HCC), Diabetes (HCC), Diabetes mellitus without complication (HCC), Heartburn, Herpes genitalis in men, HIV (human immunodeficiency virus infection) (HCC), HIV disease (HCC), HTN (hypertension), Hyperlipidemia, Hypertension, Lateral malleolar fracture (09/02/2013), Migraines, Tear of deltoid ligament of left ankle (09/02/2013), and Type 2 diabetes mellitus with hyperosmolar nonketotic hyperglycemia (HCC) (02/10/2020).   Surgical History:   Past Surgical History:  Procedure Laterality Date   NO PAST SURGERIES     ORIF ANKLE FRACTURE Left 09/13/2013   Procedure: OPEN REDUCTION INTERNAL FIXATION (ORIF) LEFT LATERAL MALLEOLUS ANKLE FRACTURE ;  Surgeon: Dannielle Huh, MD;  Location: [facility];  Service: Orthopedics;  Laterality: Left;     Social  History:   reports that she has been smoking cigarettes. She started smoking about 26 years ago. She has a 13.1 pack-year smoking history. She has never used smokeless tobacco. She reports that she does not currently use alcohol. She reports that she does not currently use drugs  after having used the following drugs: Marijuana. Frequency: 7.00 times per week.   Family History:  Her family history includes Hypertension in her mother.   Allergies Allergies  Allergen Reactions   Penicillins Anaphylaxis   Peanut-Containing Drug Products Other (See Comments)    WALNUTS - SORES ON TONGUE   Sustiva [Efavirenz] Rash     Home Medications  Prior to Admission medications   Medication Sig Start Date End Date Taking? Authorizing Provider  azithromycin (ZITHROMAX) 600 MG tablet Take 2 tablets (1,200 mg total) by mouth every Saturday. 09/04/23  Yes Judyann Munson, MD  bictegravir-emtricitabine-tenofovir AF (BIKTARVY) 50-200-25 MG TABS tablet Take 1 tablet by mouth daily. 09/02/23  Yes Judyann Munson, MD  diltiazem (CARDIZEM CD) 360 MG 24 hr capsule Take 1 capsule (360 mg total) by mouth daily. 09/02/23 09/01/24 Yes Pokhrel, Laxman, MD  Magnesium 400 MG TABS Take 400 mg by mouth 2 (two) times daily.   Yes [provider]  polyethylene glycol powder (GLYCOLAX/MIRALAX) 17 GM/SCOOP powder Take 17 g by mouth daily as needed for moderate constipation. 09/02/23  Yes Pokhrel, Laxman, MD  sulfamethoxazole-trimethoprim (BACTRIM DS) 800-160 MG tablet Take 1 tablet by mouth daily. 09/02/23  Yes Judyann Munson, MD  Accu-Chek Softclix Lancets lancets Use as directed 3 (three) times daily to check blood sugar. 09/02/23   Pokhrel, Rebekah Chesterfield, MD  Blood Glucose Monitoring Suppl (BLOOD GLUCOSE MONITOR SYSTEM) w/Device KIT Use as directed 3 (three) times daily. 09/02/23   Pokhrel, Rebekah Chesterfield, MD  Glucose Blood (BLOOD GLUCOSE TEST STRIPS) STRP Use as directed 3 (three) times daily to check blood sugar. 09/02/23   Pokhrel, Rebekah Chesterfield,  MD  insulin glargine (LANTUS) 100 UNIT/ML Solostar Pen Inject 12 Units into the skin 2 (two) times daily. 09/02/23 05/29/24  Pokhrel, Rebekah Chesterfield, MD  Insulin Pen Needle 32G X 4 MM MISC Use as directed 3 (three) times daily. 09/02/23   Pokhrel, Rebekah Chesterfield, MD  Ipratropium-Albuterol (COMBIVENT RESPIMAT) 20-100 MCG/ACT AERS respimat Inhale 1 puff into the lungs 3 (three) times daily for 7 days, THEN 1 puff every 6 (six) hours as needed for wheezing. 08/04/22 09/10/22  Rodolph Bong, MD  Lancet Device MISC 1 each by Does not apply route 3 (three) times daily. May dispense any manufacturer covered by patient's insurance. 09/02/23   Pokhrel, Rebekah Chesterfield, MD  metoprolol tartrate (LOPRESSOR) 25 MG tablet Take 1 tablet (25 mg total) by mouth 2 (two) times daily. 08/04/22 11/02/22  Rodolph Bong, MD     Critical care time: 60 minutes.   Rutherford Guys, PA - [facility] For pager details, please see AMION or use Epic chat  After 1900, please call [facility] for cross coverage needs 09/29/2023, 5:26 PM

## 2023-09-29 NOTE — Progress Notes (Signed)
 Pt was upset due to her belongings (two shirts) being misplaced. She claims that they were stolen and states that she feels violated. Patient states she wanted to leave AMA. At this time MD entered room for morning rounds. MD explained the risk and benefits of leaving AMA, patients verbalized understanding but still wished to leave AMA. Pt AxOx4.This RN obtained AMA papers, before patient singed RN called patients sister with patients permission. Sister spoke with patient and after their conversation patient stated she wished to stay until her sister could arrive.   Pt then tried to walk off the unit to go smoke a cigarette. This RN, with nurse manager present, explained that hospital policy does not allow patients to leave the unit and smoke.   Pt returned to room and was agreeable to wear heart monitor and receive IV abx. O2 was found to be 87% on RN, pt refused to wear O2. When walking back into the room, patient was observed to have an unsteady gait. Explained the importance once again of the risks of falling and the reason for a bed alarm. Pt continued to refuse bed alarm.   MD notified that patient will be staying until she can speak to her sister. Nicorette gum order placed.   Call light left within reach.

## 2023-09-29 NOTE — Progress Notes (Signed)
 [facility]   Reason for visit: Follow up on hypoxemia  Interval History: MRI done yesterday and notable for acute right medullary infarct and now being worked up for this acute stroke.  She otherwise has no complaints. Barium swallow noted and unilateral bulging noted   Physical Exam: Constitutional:  Vitals:   09/29/23 0152 09/29/23 0421  BP: (!) 159/96 (!) 151/98  Pulse: 98 (!) 101  Resp:  20  Temp:  97.8 F (36.6 C)  SpO2:  90%   patient appears in NAD Respiratory: Normal respiratory effort  Review of Systems: Constitutional: negative for fevers and chills  Lab Results  Component Value Date   WBC 6.5 09/29/2023   HGB 12.0 (L) 09/29/2023   HCT 36.5 (L) 09/29/2023   MCV 91.5 09/29/2023   PLT 236 09/29/2023    Lab Results  Component Value Date   CREATININE 0.98 09/29/2023   BUN 28 (H) 09/29/2023   NA 133 (L) 09/29/2023   K 3.8 09/29/2023   CL 97 (L) 09/29/2023   CO2 25 09/29/2023    Lab Results  Component Value Date   ALT 31 09/29/2023   AST 18 09/29/2023   ALKPHOS 71 09/29/2023     Microbiology: Recent Results (from the past 240 hours)  Blood culture (routine x 2)     Status: None (Preliminary result)   Collection Time: 09/26/23  9:01 PM   Specimen: BLOOD  Result Value Ref Range Status   Specimen Description   Final    BLOOD LEFT ANTECUBITAL Performed at [facility], [address], [place], [place] [address]    Special Requests   Final    BOTTLES DRAWN AEROBIC AND ANAEROBIC Blood Culture adequate volume Performed at [facility], [address] [place], [place] [address]    Culture   Final    NO GROWTH 3 DAYS Performed at [facility], [address], [place], [place] [address]    Report Status PENDING  Incomplete  Blood culture (routine x 2)     Status: None (Preliminary result)   Collection Time: 09/26/23  9:01 PM   Specimen: BLOOD  Result Value Ref Range Status    Specimen Description   Final    BLOOD RIGHT ANTECUBITAL Performed at [facility], [address], [place], [place] [address]    Special Requests   Final    BOTTLES DRAWN AEROBIC AND ANAEROBIC Blood Culture adequate volume Performed at [facility], [address] [place], [place] [address]    Culture   Final    NO GROWTH 3 DAYS Performed at [facility], [address], [place], [place] [address]    Report Status PENDING  Incomplete  Resp panel by RT-PCR (RSV, Flu A&B, Covid)     Status: None   Collection Time: 09/26/23  9:01 PM   Specimen: Nasal Swab  Result Value Ref Range Status   SARS Coronavirus 2 by RT PCR NEGATIVE NEGATIVE Final    Comment: (NOTE) SARS-CoV-2 target nucleic acids are NOT DETECTED.  The SARS-CoV-2 RNA is generally detectable in upper respiratory specimens during the acute phase of infection. The lowest concentration of SARS-CoV-2 viral copies this assay can detect is 138 copies/mL. A negative result does not preclude SARS-Cov-2 infection and should not be used as the sole basis for treatment or other patient management decisions. A negative result may occur with  improper specimen collection/handling, submission of specimen  other than nasopharyngeal swab, presence of viral mutation(s) within the areas targeted by this assay, and inadequate number of viral copies(<138 copies/mL). A negative result must be combined with clinical observations, patient history, and epidemiological information. The expected result is Negative.  Fact Sheet for Patients:  BloggerCourse.com  Fact Sheet for Healthcare Providers:  SeriousBroker.it  This test is no t yet approved or cleared by the [place] FDA and  has been authorized for detection and/or diagnosis of SARS-CoV-2 by FDA under an Emergency Use Authorization (EUA). This EUA will remain  in effect (meaning this test  can be used) for the duration of the COVID-19 declaration under Section 564(b)(1) of the Act, 21 U.S.C.section 360bbb-3(b)(1), unless the authorization is terminated  or revoked sooner.       Influenza A by PCR NEGATIVE NEGATIVE Final   Influenza B by PCR NEGATIVE NEGATIVE Final    Comment: (NOTE) The Xpert Xpress SARS-CoV-2/FLU/RSV plus assay is intended as an aid in the diagnosis of influenza from Nasopharyngeal swab specimens and should not be used as a sole basis for treatment. Nasal washings and aspirates are unacceptable for Xpert Xpress SARS-CoV-2/FLU/RSV testing.  Fact Sheet for Patients: BloggerCourse.com  Fact Sheet for Healthcare Providers: SeriousBroker.it  This test is not yet approved or cleared by the [place] FDA and has been authorized for detection and/or diagnosis of SARS-CoV-2 by FDA under an Emergency Use Authorization (EUA). This EUA will remain in effect (meaning this test can be used) for the duration of the COVID-19 declaration under Section 564(b)(1) of the Act, 21 U.S.C. section 360bbb-3(b)(1), unless the authorization is terminated or revoked.     Resp Syncytial Virus by PCR NEGATIVE NEGATIVE Final    Comment: (NOTE) Fact Sheet for Patients: BloggerCourse.com  Fact Sheet for Healthcare Providers: SeriousBroker.it  This test is not yet approved or cleared by the [place] FDA and has been authorized for detection and/or diagnosis of SARS-CoV-2 by FDA under an Emergency Use Authorization (EUA). This EUA will remain in effect (meaning this test can be used) for the duration of the COVID-19 declaration under Section 564(b)(1) of the Act, 21 U.S.C. section 360bbb-3(b)(1), unless the authorization is terminated or revoked.  Performed at [facility], [address], [place], [place] [address]   MRSA Next Gen by PCR, Nasal      Status: None   Collection Time: 09/27/23  8:10 AM   Specimen: Nasal Mucosa; Nasal Swab  Result Value Ref Range Status   MRSA by PCR Next Gen NOT DETECTED NOT DETECTED Final    Comment: (NOTE) The GeneXpert MRSA Assay (FDA approved for NASAL specimens only), is one component of a comprehensive MRSA colonization surveillance program. It is not intended to diagnose MRSA infection nor to guide or monitor treatment for MRSA infections. Test performance is not FDA approved in patients less than 21 years old. Performed at [facility], [address], [place], [place] [address]   Expectorated Sputum Assessment w Gram Stain, Rflx to Resp Cult     Status: None   Collection Time: 09/27/23  9:04 AM   Specimen: Sputum  Result Value Ref Range Status   Specimen Description SPU  Final   Special Requests Immunocompromised  Final   Sputum evaluation   Final    THIS SPECIMEN IS ACCEPTABLE FOR SPUTUM CULTURE Performed at [facility], [address], [place], [place] [address]    Report Status 09/27/2023 FINAL  Final  Culture, Respiratory w Gram  Stain     Status: None   Collection Time: 09/27/23  9:04 AM   Specimen: Sputum  Result Value Ref Range Status   Specimen Description   Final    SPU Performed at [facility], [address] [place], [place] [address]    Special Requests   Final    Immunocompromised Reflexed from [phone] Performed at [facility], [address] [place], [place] [address]    Gram Stain   Final    ABUNDANT WBC PRESENT,BOTH PMN AND MONONUCLEAR MODERATE SQUAMOUS EPITHELIAL CELLS PRESENT MODERATE GRAM POSITIVE COCCI IN PAIRS RARE GRAM NEGATIVE RODS FEW GRAM POSITIVE RODS    Culture   Final    FEW Normal respiratory flora-no Staph aureus or Pseudomonas seen Performed at [facility], [address] [place], [place] [address]    Report Status 09/29/2023 FINAL  Final     Impression/Plan:  1.  Acute hypoxemic respiratory failure with pneumonia.  I suspect this is likely aspiration with findings of stroke and dysphagia.  On ceftriaxone for this and off of oxygen now.  Will continue with 3 days of ceftriaxone today and stop.  2.  HIV.  He is on Biktarvy and will continue with this.  No changes indicated.  3.  New acute stroke and evaluation by neurology.  Workup underway.  Discussed smoking cessation.  4.  Positive cryptococcal antigen in the blood.  Continues on IV fluconazole for this.   Evaluation of this patient requires complex antimicrobial therapy evaluation and counseling + isolation needs for disease transmission risk assessment and mitigation.

## 2023-09-29 NOTE — Code Documentation (Signed)
 CODE BLUE NOTE  Patient Name: Jeremiah Stevens   MRN: [id number]   Date of Birth/ Sex: Sep 05, 1975 , adult      Admission Date: 09/26/2023  Attending Provider: Debarah Crape, DO  Primary Diagnosis: CAP (community acquired pneumonia)    Indication: Pt was in her usual state of health until this PM, when she was noted to be hypoxic prior to developing PEA. Code blue was subsequently called. At the time of arrival on scene, ACLS protocol was underway.    Technical Description:  CPR performance duration:  9 minutes  Was defibrillation or cardioversion used? No   Was external pacer placed? Yes but not used  Was patient intubated pre/post CPR? Yes    Medications Administered: Y = Yes; Blank = No Amiodarone    Atropine    Calcium    Epinephrine   Yes x 4  Lidocaine    Magnesium    Norepinephrine    Phenylephrine    Sodium bicarbonate   Yes x 2  Vasopressin      Post CPR evaluation:  Final Status - Patient was successfully resuscitated. What is current rhythm? Sinus tachycardia What is current hemodynamic status? Stable   Miscellaneous Information:  Labs sent, including: CBC, CMP, Lactic Acid  Primary team notified?  Yes  Family Notified? Yes by primary team  Additional notes/ transfer status: ICU   Rutherford Guys, PA [facility] 09/29/2023, 4:49 PM

## 2023-09-29 NOTE — Progress Notes (Signed)
 Occupational Therapy Treatment Patient Details Name: Jeremiah Stevens MRN: [id number] DOB: 1975/11/02 Today's Date: 09/29/2023   History of present illness Jeremiah Stevens is a 47 y.o. adult admitted with complaints if inability to swallow. w/u reveals now medulary stroke. PMH: diabetes type 2, seizure disorder, HIV untreated, obesity, polysubstance abuse   OT comments   Patient seen for skilled OT session. Work up revealed now new medulary stroke and dysphasia with patient on dietary restriction with swallow precuations. Signage placed for safe swallow by ST with pt showing decreased regard for instructions, impulsivity, attempts to amb in room and hallway without assistance or RW despite bed alarm applied and education to not get up without assistance. Patient presents with standing balance deficits, decreased motor planning with ataxia,, limited activity tolerance and decreased cognitive deficits impacting A/IADL and mobility performance and safety. Patient requires continued skilled OT in Acute care setting with NEW recommendation for HHOT vs outpatient OT depending on patient's transportation, a RW and family support for safety.       If plan is discharge home, recommend the following:  A little help with walking and/or transfers;A little help with bathing/dressing/bathroom;Assistance with cooking/housework;Assistance with feeding;Direct supervision/assist for medications management;Direct supervision/assist for financial management;Assist for transportation;Help with stairs or ramp for entrance;Supervision due to cognitive status   Equipment Recommendations   (Rolling walker)       Precautions / Restrictions Precautions Precautions: Fall (swallowing) Restrictions Weight Bearing Restrictions Per Provider Order: No       Mobility Bed Mobility Overal bed mobility: Modified Independent                  Transfers Overall transfer level: Needs assistance Equipment  used: Rolling walker (2 wheels) Transfers: Sit to/from Stand, Bed to chair/wheelchair/BSC Sit to Stand: Contact guard assist           General transfer comment: mild decreased standing level balance and sit to stand cues for powering up     Balance Overall balance assessment: Needs assistance Sitting-balance support: No upper extremity supported, Feet supported Sitting balance-Leahy Scale: Good     Standing balance support: Single extremity supported, During functional activity Standing balance-Leahy Scale: Fair (several mild LOB noted with + patient self correction)                             ADL either performed or assessed with clinical judgement   ADL Overall ADL's : Needs assistance/impaired Eating/Feeding: Supervision/ safety;Cueing for safety (signage placed for safe swallow with pt showing decreased regard for instructions)   Grooming: Wash/dry hands;Wash/dry face;Oral care;Applying deodorant;Brushing hair;Supervision/safety;Sitting;Standing   Upper Body Bathing: Supervision/ safety;Sitting   Lower Body Bathing: Contact guard assist;Supervison/ safety   Upper Body Dressing : Supervision/safety   Lower Body Dressing: Supervision/safety;Contact guard assist;Set up;Sitting/lateral leans;Sit to/from stand Lower Body Dressing Details (indicate cue type and reason):  (attempts reaching without safe balance and stability) Toilet Transfer: Contact guard assist;Ambulation;Rolling walker (2 wheels)   Toileting- Clothing Manipulation and Hygiene: Supervision/safety       Functional mobility during ADLs: Contact guard assist;Rolling walker (2 wheels) General ADL Comments: RA this session with suction for secretions, poor safety awareness    Extremity/Trunk Assessment Upper Extremity Assessment Upper Extremity Assessment: Overall WFL for tasks assessed                     Communication Communication Communication: Impaired Factors Affecting  Communication: Reduced clarity of speech  Cognition Arousal: Alert Behavior During Therapy: Restless, Impulsive Cognition: Cognition impaired   Orientation impairments: Person, Place, Situation Awareness: Intellectual awareness impaired, Online awareness impaired   Attention impairment (select first level of impairment): Selective attention Executive functioning impairment (select all impairments): Organization OT - Cognition Comments: poor insight into balance and swallowing deficits, was witness amb with RW outside of room despite bed alarm set and reinforcement to use nurse call button prior to all mobility                 Following commands: Intact        Cueing   Cueing Techniques: Verbal cues             Pertinent Vitals/ Pain       Pain Assessment Pain Assessment: No/denies pain   Frequency  Min 2X/week        Progress Toward Goals  OT Goals(current goals can now be found in the care plan section)  Progress towards OT goals: Progressing toward goals  Acute Rehab OT Goals Patient Stated Goal: to go home OT Goal Formulation: With patient Time For Goal Achievement: 10/11/23 Potential to Achieve Goals: Good ADL Goals Pt Will Perform Lower Body Dressing: with modified independence Pt Will Transfer to Toilet: regular height toilet;with supervision Additional ADL Goal #1: pt will teach back 2/3 energy conservation techniques with BADL's and mobility  Plan         AM-PAC OT "6 Clicks" Daily Activity     Outcome Measure   Help from another person eating meals?: A Little Help from another person taking care of personal grooming?: A Little Help from another person toileting, which includes using toliet, bedpan, or urinal?: A Little Help from another person bathing (including washing, rinsing, drying)?: A Little Help from another person to put on and taking off regular upper body clothing?: A Little Help from another person to put on and taking off  regular lower body clothing?: A Little 6 Click Score: 18    End of Session Equipment Utilized During Treatment: Gait belt;Rolling walker (2 wheels) (suction)  OT Visit Diagnosis: Unsteadiness on feet (R26.81);Cognitive communication deficit (R41.841) Symptoms and signs involving cognitive functions: Cerebral infarction;Other cerebrovascular disease   Activity Tolerance Patient tolerated treatment well   Patient Left in bed;Other (comment);with bed alarm set;with call bell/phone within reach (EOB)   Nurse Communication Mobility status        Time: 1610-9604 OT Time Calculation (min): 26 min  Charges: OT General Charges $OT Visit: 1 Visit OT Treatments $Self Care/Home Management : 23-37 mins  Jeremiah Stevens OT/L Acute Rehabilitation Department  [phone]     09/29/2023, 12:08 PM

## 2023-09-29 NOTE — Progress Notes (Signed)
   09/29/23 1620  Vent Select  $ Ventilator Initial/Subsequent  Initial  $ Intubation  Yes  Invasive or Noninvasive Invasive  Adult Vent Y  Vent start date 09/29/23  Vent start time 1618  Airway 7.5 mm  Placement Date/Time: 09/29/23 1618   Placed By: ICU physician  Airway Device: Oral Pharyngeal Airway  ETT Types: Oral  Size (mm): 7.5 mm  Cuffed: Cuffed  Airway Equipment: Stylet;Video Laryngoscope  Placement Confirmation: Direct Visualization;Bilater...  Secured at (cm) (S)  24 cm  Measured From Lips  Secured Location Center  Secured By English as a second language teacher No  Prone position No  Head position Left  Cuff Pressure (cm H2O) Green OR 18-26 CmH2O  Site Condition Drainage (Comment) (lots of oral secretions.)  Adult Ventilator Settings  Vent Type Servo i  Humidity HME  Vent Mode (S)  PRVC  Vt Set (S)  500 mL  Set Rate (S)  18 bmp  FiO2 (%) (S)  100 %  I Time 0.91 Sec(s)  PEEP (S)  5 cmH20  Adult Ventilator Measurements  Peak Airway Pressure 31 L/min  Mean Airway Pressure 11 cmH20  Plateau Pressure 18 cmH20  Resp Rate Spontaneous 1 br/min  Resp Rate Total 19 br/min  Exhaled Vt 504 mL  Measured Ve 9.8 L  I:E Ratio Measured 1:2.6  Total PEEP 5 cmH20  SpO2 100 %  Adult Ventilator Alarms  Alarms On Y  Ve High Alarm 30 L/min  Ve Low Alarm 2 L/min  Resp Rate High Alarm 36 br/min  Resp Rate Low Alarm 12  PEEP Low Alarm 2 cmH2O  Press High Alarm 40 cmH2O  T Apnea 20 sec(s)  VAP Prevention  HOB> 30 Degrees Y  Breath Sounds  Bilateral Breath Sounds Rhonchi  Vent Respiratory Assessment  Level of Consciousness Unresponsive  Suction Method  Respiratory Interventions Oral suction;Airway suction  Oral Suctioning/Secretions  Suction Type Oral  Suction Device Yankauer  Secretion Amount Copious  Secretion Color Pink tinged  Secretion Consistency Thin  Suction Tolerance Tolerated well  Suctioning Adverse Effects None  Airway Suctioning/Secretions  Suction Type  ETT  Suction Device  Catheter  Secretion Amount Copious  Secretion Color Pink tinged  Secretion Consistency Thin  Suction Tolerance Tolerated well  Suctioning Adverse Effects None

## 2023-09-30 ENCOUNTER — Inpatient Hospital Stay (HOSPITAL_COMMUNITY)

## 2023-09-30 DIAGNOSIS — R1310 Dysphagia, unspecified: Secondary | ICD-10-CM

## 2023-09-30 DIAGNOSIS — R509 Fever, unspecified: Secondary | ICD-10-CM | POA: Diagnosis not present

## 2023-09-30 DIAGNOSIS — I959 Hypotension, unspecified: Secondary | ICD-10-CM | POA: Diagnosis not present

## 2023-09-30 DIAGNOSIS — I639 Cerebral infarction, unspecified: Secondary | ICD-10-CM | POA: Diagnosis not present

## 2023-09-30 DIAGNOSIS — J189 Pneumonia, unspecified organism: Secondary | ICD-10-CM | POA: Diagnosis not present

## 2023-09-30 DIAGNOSIS — J9601 Acute respiratory failure with hypoxia: Secondary | ICD-10-CM | POA: Diagnosis not present

## 2023-09-30 DIAGNOSIS — I469 Cardiac arrest, cause unspecified: Secondary | ICD-10-CM | POA: Diagnosis not present

## 2023-09-30 DIAGNOSIS — F17210 Nicotine dependence, cigarettes, uncomplicated: Secondary | ICD-10-CM

## 2023-09-30 DIAGNOSIS — R569 Unspecified convulsions: Secondary | ICD-10-CM | POA: Diagnosis not present

## 2023-09-30 LAB — POCT I-STAT 7, (LYTES, BLD GAS, ICA,H+H)
Acid-Base Excess: 7 mmol/L — ABNORMAL HIGH (ref 0.0–2.0)
Bicarbonate: 28.8 mmol/L — ABNORMAL HIGH (ref 20.0–28.0)
Calcium, Ion: 1.19 mmol/L (ref 1.15–1.40)
HCT: 30 % — ABNORMAL LOW (ref 39.0–52.0)
Hemoglobin: 10.2 g/dL — ABNORMAL LOW (ref 13.0–17.0)
O2 Saturation: 98 %
Patient temperature: 36.9
Potassium: 3.2 mmol/L — ABNORMAL LOW (ref 3.5–5.1)
Sodium: 142 mmol/L (ref 135–145)
TCO2: 30 mmol/L (ref 22–32)
pCO2 arterial: 32.1 mmHg (ref 32–48)
pH, Arterial: 7.561 — ABNORMAL HIGH (ref 7.35–7.45)
pO2, Arterial: 82 mmHg — ABNORMAL LOW (ref 83–108)

## 2023-09-30 LAB — BASIC METABOLIC PANEL
Anion gap: 8 (ref 5–15)
BUN: 27 mg/dL — ABNORMAL HIGH (ref 6–20)
CO2: 27 mmol/L (ref 22–32)
Calcium: 8.7 mg/dL — ABNORMAL LOW (ref 8.9–10.3)
Chloride: 103 mmol/L (ref 98–111)
Creatinine, Ser: 1.55 mg/dL — ABNORMAL HIGH (ref 0.61–1.24)
GFR, Estimated: 55 mL/min — ABNORMAL LOW (ref >60)
Glucose, Bld: 115 mg/dL — ABNORMAL HIGH (ref 70–99)
Potassium: 3.1 mmol/L — ABNORMAL LOW (ref 3.5–5.1)
Sodium: 138 mmol/L (ref 135–145)

## 2023-09-30 LAB — CBC
HCT: 30.6 % — ABNORMAL LOW (ref 39.0–52.0)
Hemoglobin: 10.4 g/dL — ABNORMAL LOW (ref 13.0–17.0)
MCH: 29.6 pg (ref 26.0–34.0)
MCHC: 34 g/dL (ref 30.0–36.0)
MCV: 87.2 fL (ref 80.0–100.0)
Platelets: 195 10*3/uL (ref 150–400)
RBC: 3.51 MIL/uL — ABNORMAL LOW (ref 4.22–5.81)
RDW: 14.7 % (ref 11.5–15.5)
WBC: 5.5 10*3/uL (ref 4.0–10.5)
nRBC: 0 % (ref 0.0–0.2)

## 2023-09-30 LAB — GLUCOSE, CAPILLARY
Glucose-Capillary: 102 mg/dL — ABNORMAL HIGH (ref 70–99)
Glucose-Capillary: 119 mg/dL — ABNORMAL HIGH (ref 70–99)
Glucose-Capillary: 126 mg/dL — ABNORMAL HIGH (ref 70–99)
Glucose-Capillary: 127 mg/dL — ABNORMAL HIGH (ref 70–99)
Glucose-Capillary: 134 mg/dL — ABNORMAL HIGH (ref 70–99)
Glucose-Capillary: 149 mg/dL — ABNORMAL HIGH (ref 70–99)

## 2023-09-30 LAB — MAGNESIUM: Magnesium: 2.1 mg/dL (ref 1.7–2.4)

## 2023-09-30 LAB — PHOSPHORUS: Phosphorus: 1.7 mg/dL — ABNORMAL LOW (ref 2.5–4.6)

## 2023-09-30 LAB — T3: T3, Total: 102 ng/dL (ref 71–180)

## 2023-09-30 LAB — CBG MONITORING, ED: Glucose-Capillary: 108 mg/dL — ABNORMAL HIGH (ref 70–99)

## 2023-09-30 LAB — MRSA NEXT GEN BY PCR, NASAL: MRSA by PCR Next Gen: NOT DETECTED

## 2023-09-30 LAB — TRIGLYCERIDES: Triglycerides: 219 mg/dL — ABNORMAL HIGH (ref <150)

## 2023-09-30 MED ORDER — PROSOURCE TF20 ENFIT COMPATIBL EN LIQD
60.0000 mL | Freq: Every day | ENTERAL | Status: DC
  Administered 2023-09-30 – 2023-11-09 (×41): 60 mL
  Filled 2023-09-30 (×41): qty 60

## 2023-09-30 MED ORDER — OSMOLITE 1.5 CAL PO LIQD
1000.0000 mL | ORAL | Status: DC
  Administered 2023-09-30 – 2023-10-09 (×9): 1000 mL
  Filled 2023-09-30 (×7): qty 1000

## 2023-09-30 MED ORDER — NICOTINE 14 MG/24HR TD PT24
14.0000 mg | MEDICATED_PATCH | Freq: Every day | TRANSDERMAL | Status: DC
Start: 2023-09-30 — End: 2023-11-10
  Administered 2023-09-30 – 2023-11-10 (×42): 14 mg via TRANSDERMAL
  Filled 2023-09-30 (×42): qty 1

## 2023-09-30 MED ORDER — ADULT MULTIVITAMIN W/MINERALS CH
1.0000 | ORAL_TABLET | Freq: Every day | ORAL | Status: DC
  Administered 2023-10-01 – 2023-11-07 (×37): 1
  Filled 2023-09-30 (×38): qty 1

## 2023-09-30 MED ORDER — ACETAMINOPHEN 325 MG PO TABS
650.0000 mg | ORAL_TABLET | Freq: Four times a day (QID) | ORAL | Status: DC | PRN
  Administered 2023-09-30 – 2023-11-07 (×17): 650 mg
  Filled 2023-09-30 (×18): qty 2

## 2023-09-30 MED ORDER — DOLUTEGRAVIR SODIUM 50 MG PO TABS
50.0000 mg | ORAL_TABLET | Freq: Every day | ORAL | Status: DC
  Administered 2023-09-30 – 2023-11-08 (×39): 50 mg
  Filled 2023-09-30 (×40): qty 1

## 2023-09-30 MED ORDER — OSMOLITE 1.5 CAL PO LIQD: 1000.0000 mL | ORAL | Status: DC

## 2023-09-30 MED ORDER — LEVALBUTEROL HCL 0.63 MG/3ML IN NEBU
0.6300 mg | INHALATION_SOLUTION | Freq: Four times a day (QID) | RESPIRATORY_TRACT | Status: DC
Start: 2023-09-30 — End: 2023-10-05
  Administered 2023-09-30 – 2023-10-05 (×19): 0.63 mg via RESPIRATORY_TRACT
  Filled 2023-09-30 (×19): qty 3

## 2023-09-30 MED ORDER — POTASSIUM PHOSPHATES 15 MMOLE/5ML IV SOLN
30.0000 mmol | Freq: Once | INTRAVENOUS | Status: AC
  Administered 2023-09-30: 30 mmol via INTRAVENOUS
  Filled 2023-09-30: qty 10

## 2023-09-30 MED ORDER — VITAL HIGH PROTEIN PO LIQD
1000.0000 mL | ORAL | Status: DC
  Administered 2023-09-30: 1000 mL

## 2023-09-30 MED ORDER — EMTRICITABINE-TENOFOVIR AF 200-25 MG PO TABS
1.0000 | ORAL_TABLET | Freq: Every day | ORAL | Status: DC
  Administered 2023-09-30 – 2023-10-13 (×14): 1
  Filled 2023-09-30 (×14): qty 1

## 2023-09-30 MED ORDER — ENOXAPARIN SODIUM 60 MG/0.6ML IJ SOSY
60.0000 mg | PREFILLED_SYRINGE | INTRAMUSCULAR | Status: DC
  Administered 2023-09-30 – 2023-10-03 (×4): 60 mg via SUBCUTANEOUS
  Filled 2023-09-30 (×5): qty 0.6

## 2023-09-30 MED ORDER — ACETAMINOPHEN 650 MG RE SUPP: 650.0000 mg | Freq: Four times a day (QID) | RECTAL | Status: DC | PRN

## 2023-09-30 NOTE — Consult Note (Signed)
 Psych consult received for capacity assessment. Chart review shows patient likely had a aspiration event and was found PEA. He is currently intubated at this time. Psychiatry consult service to sign off. Please reconsult if assistance is needed any further.

## 2023-09-30 NOTE — Progress Notes (Signed)
 STROKE TEAM PROGRESS NOTE    SIGNIFICANT HOSPITAL EVENTS  3/16: Admitted for defeat dysphagia, found to have multifocal pneumonia likely due to aspiration. 3/18: Developed right facial droop and right-sided weakness.  MRI shows acute right medullary infarct  3/19: Developed hypoxia and respiratory distress leading to cardiac arrest with 9-minute downtime followed by seizure episode.  Difficult airway was noted due to emesis.  Transferred to ICU.  Cerebellar EEG negative LTM EEG placed overnight  INTERIM HISTORY/SUBJECTIVE  No family at bedside.  RN at bedside. Patient still intubated, sedated, eyes closed, not following commands.  Corneal reflex present on left, absent on right.  Cough and gag present.  Continues to breathe over the vent. LTM EEG in place.  Will wean sedation as tolerated.  OBJECTIVE  CBC    Component Value Date/Time   WBC 5.5 09/30/2023 0442   RBC 3.51 (L) 09/30/2023 0442   HGB 10.4 (L) 09/30/2023 0442   HCT 30.6 (L) 09/30/2023 0442   PLT 195 09/30/2023 0442   MCV 87.2 09/30/2023 0442   MCH 29.6 09/30/2023 0442   MCHC 34.0 09/30/2023 0442   RDW 14.7 09/30/2023 0442   LYMPHSABS 0.8 09/29/2023 0846   MONOABS 0.7 09/29/2023 0846   EOSABS 0.0 09/29/2023 0846   BASOSABS 0.0 09/29/2023 0846    BMET    Component Value Date/Time   NA 138 09/30/2023 0442   K 3.1 (L) 09/30/2023 0442   CL 103 09/30/2023 0442   CO2 27 09/30/2023 0442   GLUCOSE 115 (H) 09/30/2023 0442   BUN 27 (H) 09/30/2023 0442   CREATININE 1.55 (H) 09/30/2023 0442   CREATININE 1.03 05/22/2020 1128   CALCIUM 8.7 (L) 09/30/2023 0442   GFRNONAA 55 (L) 09/30/2023 0442   GFRNONAA 89 05/22/2020 1128    IMAGING past 24 hours DG Abd 1 View Result Date: 09/29/2023 CLINICAL DATA:  Enteric catheter placement EXAM: ABDOMEN - 1 VIEW COMPARISON:  08/02/2022 FINDINGS: Frontal view of the lower chest and upper abdomen demonstrates enteric catheter passing below diaphragm, tip projecting over the gastric  antrum. Nonspecific gaseous distention of the large and small bowel. IMPRESSION: 1. Enteric catheter tip projecting over the gastric antrum. Electronically Signed   By: Sharlet Salina M.D.   On: 09/29/2023 17:30   DG Chest 1 View Result Date: 09/29/2023 CLINICAL DATA:  Intubated EXAM: CHEST  1 VIEW COMPARISON:  09/29/2023 FINDINGS: Single frontal view of the chest demonstrates endotracheal tube overlying tracheal air column, tip 2.2 cm above carina. Enteric catheter passes below diaphragm, tip excluded by collimation. Cardiac silhouette is enlarged but stable. There is increased pulmonary vascular congestion. No effusion or pneumothorax. Chronic elevation the right hemidiaphragm. No acute bony abnormalities. IMPRESSION: 1. Support devices as above. 2. Enlarged cardiac silhouette, with increased pulmonary vascular congestion. Electronically Signed   By: Sharlet Salina M.D.   On: 09/29/2023 17:28   DG Chest 1 View Result Date: 09/29/2023 CLINICAL DATA:  Shortness of breath. EXAM: CHEST  1 VIEW COMPARISON:  Chest radiograph dated 09/26/2023. FINDINGS: Mild cardiomegaly with mild vascular congestion. No focal consolidation, pleural effusion, pneumothorax. No acute osseous pathology. IMPRESSION: Mild cardiomegaly with mild vascular congestion. Electronically Signed   By: Elgie Collard M.D.   On: 09/29/2023 17:26   ECHOCARDIOGRAM COMPLETE Result Date: 09/29/2023    ECHOCARDIOGRAM REPORT   Patient Name:   Jeremiah Stevens Date of Exam: 09/29/2023 Medical Rec #:  [id number]    Height:       72.0 in Accession #:  [id number]   Weight:       255.3 lb Date of Birth:  09-17-1975   BSA:          2.363 m Patient Age:    48 years     BP:           151/98 mmHg Patient Gender: M            HR:           112 bpm. Exam Location:  Inpatient Procedure: 2D Echo, Cardiac Doppler and Color Doppler (Both Spectral and Color            Flow Doppler were utilized during procedure). Indications:    Stroke  History:        Patient has  prior history of Echocardiogram examinations. Risk                 Factors:Hypertension and Diabetes.  Sonographer:    Lamont Snowball Referring Phys: [id number] [facility]  Sonographer Comments: Image acquisition challenging due to uncooperative patient. IMPRESSIONS  1. Mild intracavitary gradient. Peak velocity 0.88 m/s. Peak gradient 3.1 mmHg. Left ventricular ejection fraction, by estimation, is 60 to 65%. The left ventricle has normal function. The left ventricle has no regional wall motion abnormalities. There is moderate concentric left ventricular hypertrophy. Left ventricular diastolic parameters are consistent with Grade I diastolic dysfunction (impaired relaxation).  2. Right ventricular systolic function is normal. The right ventricular size is normal. There is normal pulmonary artery systolic pressure.  3. The mitral valve is normal in structure. Trivial mitral valve regurgitation. No evidence of mitral stenosis.  4. The aortic valve is normal in structure. Aortic valve regurgitation is not visualized. No aortic stenosis is present.  5. The inferior vena cava is normal in size with greater than 50% respiratory variability, suggesting right atrial pressure of 3 mmHg. FINDINGS  Left Ventricle: Mild intracavitary gradient. Peak velocity 0.88 m/s. Peak gradient 3.1 mmHg. Left ventricular ejection fraction, by estimation, is 60 to 65%. The left ventricle has normal function. The left ventricle has no regional wall motion abnormalities. The left ventricular internal cavity size was normal in size. There is moderate concentric left ventricular hypertrophy. Left ventricular diastolic parameters are consistent with Grade I diastolic dysfunction (impaired relaxation). Indeterminate filling pressures. Right Ventricle: The right ventricular size is normal. No increase in right ventricular wall thickness. Right ventricular systolic function is normal. There is normal pulmonary artery systolic pressure. The  tricuspid regurgitant velocity is 1.94 m/s, and  with an assumed right atrial pressure of 3 mmHg, the estimated right ventricular systolic pressure is 18.1 mmHg. Left Atrium: Left atrial size was normal in size. Right Atrium: Right atrial size was normal in size. Pericardium: There is no evidence of pericardial effusion. Mitral Valve: The mitral valve is normal in structure. Trivial mitral valve regurgitation. No evidence of mitral valve stenosis. MV peak gradient, 6.4 mmHg. The mean mitral valve gradient is 3.0 mmHg. Tricuspid Valve: The tricuspid valve is normal in structure. Tricuspid valve regurgitation is trivial. No evidence of tricuspid stenosis. Aortic Valve: The aortic valve is normal in structure. Aortic valve regurgitation is not visualized. No aortic stenosis is present. Aortic valve peak gradient measures 18.5 mmHg. Pulmonic Valve: The pulmonic valve was normal in structure. Pulmonic valve regurgitation is not visualized. No evidence of pulmonic stenosis. Aorta: The aortic root is normal in size and structure. Venous: The inferior vena cava is normal in size with greater than 50% respiratory variability, suggesting right  atrial pressure of 3 mmHg. IAS/Shunts: No atrial level shunt detected by color flow Doppler.  LEFT VENTRICLE PLAX 2D LVIDd:         4.50 cm   Diastology LVIDs:         3.00 cm   LV e' medial:    8.27 cm/s LV PW:         1.50 cm   LV E/e' medial:  12.7 LV IVS:        1.40 cm   LV e' lateral:   7.72 cm/s LVOT diam:     2.20 cm   LV E/e' lateral: 13.6 LV SV:         82 LV SV Index:   35 LVOT Area:     3.80 cm  RIGHT VENTRICLE             IVC RV Basal diam:  4.40 cm     IVC diam: 1.40 cm RV S prime:     25.90 cm/s TAPSE (M-mode): 2.8 cm LEFT ATRIUM             Index        RIGHT ATRIUM           Index LA Vol (A2C):   65.4 ml 27.67 ml/m  RA Area:     18.80 cm LA Vol (A4C):   54.6 ml 23.10 ml/m  RA Volume:   52.90 ml  22.38 ml/m LA Biplane Vol: 60.0 ml 25.39 ml/m  AORTIC VALVE AV Area  (Vmax): 2.51 cm AV Vmax:        215.00 cm/s AV Peak Grad:   18.5 mmHg LVOT Vmax:      142.00 cm/s LVOT Vmean:     97.900 cm/s LVOT VTI:       0.217 m  AORTA Ao Root diam: 3.20 cm Ao Asc diam:  3.80 cm MITRAL VALVE                TRICUSPID VALVE MV Area (PHT): 3.55 cm     TR Peak grad:   15.1 mmHg MV Area VTI:   3.27 cm     TR Vmax:        194.00 cm/s MV Peak grad:  6.4 mmHg MV Mean grad:  3.0 mmHg     SHUNTS MV Vmax:       1.26 m/s     Systemic VTI:  0.22 m MV Vmean:      83.0 cm/s    Systemic Diam: 2.20 cm MV E velocity: 105.00 cm/s MV A velocity: 93.80 cm/s MV E/A ratio:  1.12 Chilton Si MD Electronically signed by Chilton Si MD Signature Date/Time: 09/29/2023/4:21:51 PM    Final     Vitals:   09/30/23 0645 09/30/23 0700 09/30/23 0715 09/30/23 0756  BP: 137/84 117/77 125/76   Pulse: 62 62 68   Resp: 19 19 16    Temp: (!) 97.3 F (36.3 C) (!) 97.2 F (36.2 C) (!) 97 F (36.1 C)   TempSrc:      SpO2: 100% 100% 100% 100%  Weight:      Height:         PHYSICAL EXAM General:  Alert, well-nourished, well-developed patient in no acute distress Psych:  Mood and affect appropriate for situation CV: Regular rate and rhythm on monitor Respiratory:  Regular, unlabored respirations on room air GI: Abdomen soft and nontender   NEURO:  Intubated, sedated.  Eyes closed does not open to voice or noxious stimuli.  Does not follow commands. Cranial Nerves:  II: With forced eye opening, pupils sluggish to light.  Corneal reflex present on left, absent on right. III, IV, VI: Midline, upper gaze position.  No blink to threat bilaterally, doll's eyes absent  VII: UTA due to ETT VIII: hearing intact to voice. IX, X: Cough and gag present. ZO:[id number] shrug 5/5. XII: UTA Motor/sensory: BUE: No movement on pain stimulation. BLE: Withdrawal to pain Coordination: Deferred Gait- deferred  Most Recent NIH: 16   ASSESSMENT/PLAN  Ms. Avishai Reihl is a 48 y.o. adult with history  of HIV, DM, HTN, HLD, seizures in the setting of severe hyperglycemia, current smoker who presented to ED due to inability to swallow for 5 days, admitted for pnemonia. Neurology consulted 3/19 for dysphagia.  NIH on consult: 3.  Respiratory failure ? Anoxic brain injury Aspiration pneumonia  Developed hypoxia and respiratory distress leading to cardiac arrest with 9-minute downtime followed by seizure episode.  Difficult airway was noted due to emesis.  Intubated on vent Vent management per CCM Appreciate CCM assistance On fentanyl and propofol On rocephin and flagyl Will repeat MRI to evaluate anoxic brain injury if no neuro improvement  Stroke:  right lateral medullary infarct, etiology: small or large vessel disease from uncontrolled stroke risk factors    CT head x 2 no acute finding CTA head & neck no LVO, advanced for age arthrosclerosis L vertebral V4 segment and proximal basilar artery MRI  Acute Right Medullary Infarct. Cytotoxic edema with no hemorrhage or mass effect. 2D Echo: LVEF 60-65%, Grade I diastolic dysfunction LDL 95 HgbA1c 10.8 UDS positive for THC VTE prophylaxis -Lovenox No antithrombotic prior to admission, now on aspirin 81 mg daily and clopidogrel 75 mg daily. Therapy recommendations:  Pending Disposition:  pending  Seizure  Post cardiac arrest seizure activity On keppra Now on LTM EEG  Hx of Hypertension Now hypotensive Home meds:  lopressor UnStable Required use of Pressor this AM, on levophed Avoid hypotension, goal MAP >65 Long term BP goal normotensive  Hyperlipidemia Home meds:  none LDL 95, goal < 70 Add Lipitor 20mg  Continue statin on discharge  Diabetes type II Uncontrolled Home meds: Lantus insulin (previously noted noncompliance) HgbA1c 10.8, goal < 7.0 CBGs SSI Recommend close follow-up with PCP for better DM control  Tobacco Abuse Patient smokes 0.5 packs per day for 26 years      Ready to quit? N/A Nicotine replacement  therapy provided  Substance Abuse UDS positive for  THC       Ready to quit? 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 These were not seen on 08/28/23 MRI  Other Active Problems Hx of HIV, Home Meds: Biktarvy, bactrim and diflucan   Hospital day # 4  Pt seen by Neuro NP/APP and later by MD. Note/plan to be edited by MD as needed.    Lynnae January, DNP, AGACNP-BC [facility] Please use AMION for contact information & EPIC for messaging.  ATTENDING NOTE: I reviewed above note and agree with the assessment and plan. Pt was seen and examined.   No family at bedside.  Patient still intubated on  sedation, eyes closed, not following commands. With forced eye opening, eyes in mildly upper gaze position, not blinking to visual threat, doll's eyes absent, not tracking, pupils sluggish to light. Corneal reflex present on the left, absent on the right, gag and cough present. Breathing over the vent.  Facial symmetry not able to test due to ET tube.  Tongue protrusion not cooperative. On pain stimulation, no movement in the upper extremity, however withdrawal bilateral lower extremities. Sensation, coordination and gait not tested.   Pt still intubated, on LTM EEG, no clinical seizure now on sedations. Continue keppra. If no neuro improved soon, will need to repeat MRI to rule out anoxic brain injury. On TF and Abx. Continue DAPT and statin. On pressor for BP support. Appreciate CCM assistance.   For detailed assessment and plan, please refer to above/below as I have made changes wherever appropriate.   Marvel Plan, MD  PhD Stroke Neurology 09/30/2023 9:17 PM  This patient is critically ill due to stroke, dysphagia, respiratory failure, cardiac arrest, seizure and at significant risk of neurological worsening, death form recurrent stroke anoxic brain injury, heart failure status epilepticus. This patient's care requires constant monitoring of vital signs, hemodynamics, respiratory and cardiac monitoring, review of multiple databases, neurological assessment, discussion with family, other specialists and medical decision making of high complexity. I spent 40 minutes of neurocritical care time in the care of this patient. I discussed with Dr. Vassie Loll CCM

## 2023-09-30 NOTE — Progress Notes (Signed)
 vLTM maintenance  All impedances below 10k.  P3 slightly higher.  Reprepped and repasted times 2.  No skin breakdown noted at P3  C3  T4  P4

## 2023-09-30 NOTE — Progress Notes (Signed)
 NAME:  Jeremiah Stevens, MRN:  [id number], DOB:  [birth year], LOS: 4 ADMISSION DATE:  09/26/2023, CONSULTATION DATE:  09/29/23 REFERRING MD:  Rennis Chris CHIEF COMPLAINT:  Hypoxia   History of Present Illness:  Pt is encephelopathic; therefore, this HPI is obtained from chart review. Jeremiah Stevens is a 48 y.o. adult who has a PMH as below including but not limited to DM2, seizures, HIV AIDS untreated, cryptococcal disease, obesity, polysubstance abuse, prior CVA. She was admitted 09/26/23 with dysphagia x 3 days along with headaches and drooling. He was found to be hypoxic to 80s on room air.  CTA was negative for PE but showed multifocal PNA. He was started on abx and BD's. Biktarvy was continued and ID was consulted.   3/18 He had right facial droop, right sided weakness, dysarthria. Neurology was consulted and recommended CTA and MRI. MRI demonstrated acute right medullary infarct.  Despite ongoing dysphagia (MBS showed moderately severe pharyngeal phase dysphagia and imaging showed unilateral bulging on right), pt insisted on eating. He reportedly ordered pizza overnight 3/18 and certainly aspirated. He had threatened to leave AMA despite stroke workup ongoing.  3/19, he had hypoxia for which rapid response was called. While being assess, he continued to desaturate down into the 60s and then 40s. He required BVM and was transferred to the ICU for further evaluation and management. Upon arrival in the ICU, she had ongoing desaturations and bradycardia before developing PEA arrest.  She had 9 minutes ACLS prior to ROSC including epi x 4 and bicarb x 2. During intubation attempt, she had copious food material and emesis in her oropharynx. Visualization of vocal cords was difficult due to amount of food material and emesis despite suctioning. On 2nd attempt, ETT was passed successfully and saturations improved shortly thereafter. There was grand mal seizure activity noted after intubation that  abated after Midazolam administration.  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[facility]); Non-suicidal depressed mood; Gender dysphoria; RLL pneumonia; AKI (acute kidney injury) (HCC); Uncontrolled type 2 diabetes mellitus with hyperglycemia, without long-term current use of insulin (HCC); Tobacco abuse; HIV infection (HCC); Positive RPR test; Hyperosmolar hyperglycemic state (HHS) (HCC); Seizure (HCC); Acute hypoxic respiratory failure (HCC); Acute encephalopathy; Hyperammonemia (HCC); Pancytopenia (HCC); Cryptococcosis (HCC); CAP (community acquired pneumonia); Globus sensation; Aspiration pneumonia of both lungs due to vomit (HCC); Cardiac arrest, cause unspecified (HCC); Cerebrovascular accident (CVA) (HCC); and Seizures (HCC) on their problem list.  Significant Hospital Events: Including procedures, antibiotic start and stop dates in addition to other pertinent events   3/16 admit 3/18 neuro consult, found to have acute CVA 3/19 Ongoing aspiration despite obvious dysphagia, was planning to leave AMA but sister talked pt into staying. then had a massive aspiration event leading to worse hypoxia and PEA arrest. Txf to [facility] 3/20 PSV  Interim History / Subjective:  Recently sedated after trying to pull out ETT On PSV   Objective:  Blood pressure 125/76, pulse 68, temperature (!) 97 F (36.1 C), resp. rate 16, height 6' (1.829 m), weight 116.4 kg, SpO2 100%.    Vent Mode: PSV;CPAP FiO2 (%):  [40 %-100 %] 40 % Set Rate:  [18 bmp-26 bmp] 18 bmp Vt Set:  [500 mL-620 mL] 620 mL PEEP:  [5 cmH20] 5 cmH20 Pressure Support:  [10 cmH20] 10 cmH20 Plateau Pressure:  [15 cmH20-23 cmH20] 23 cmH20   Intake/Output Summary (Last 24 hours) at 09/30/2023 1014 Last data filed  at 09/30/2023 0700 Gross per 24 hour  Intake 1866.52 ml  Output 880 ml  Net 986.52 ml   Filed Weights   09/27/23 2256  09/29/23 1655  Weight: 115.8 kg 116.4 kg    Examination: General: Chronically and acutely ill adult intubated sedated  Neuro: Sedated. Slightly disconjugate upward gaze  HEENT: NCAT pink mm ETT secure  Cardiovascular:  rr s1s2  Lungs: Coarse, some rhonchi. Mechanically ventilated  Abdomen: Soft round  Musculoskeletal: no obvious acute joint deformity Skin: c/d/w   Labs/imaging personally reviewed:  CTA chest 3/16 > no PE, multifocal PNA. CT head 3/18 > neg. MRI brain 3/19 > acute right medullary infarct, cytotoxic edema with no hemorrhage or mass effect, small chronic right cerebellar infarcts. CTA head/neck 3/19 > no LVO, advanced for age atherosclerosis of L vertebral artery and prox basilar artery, possible mod L V4 stenosis. BMP w K 3.1 Cr 1.55, Phos 1.7  T3 WNL  Trigs 219  Assessment & Plan:    PEA arrest Sz like activity  -reportedly purposeful movements 3/20  P -cEEG -keppra loaded 3/19, now on BID keppra 500mg   -neuro following -supportive care post arrest, avoid fevers  -depending on course the next few days, possible MRI   Acute hypoxic respiratory failure Multifocal PNA, CAP  Aspiration event Dysphagia  Tobacco use  P -VAP, pulm hygiene -WUA/SBT as able -cont rocephin flagyl  -when we are able to extubate, NPO  -nicotine patch   Acute R medullary infarct, cytotoxic edema Hx small R cerebellar infarcts, R PICA territory infarct  P - dapt statin -secondary w/u per stroke service   Hypotension, suspect sedation related -consider sepsis P -NE for MAP > 65  -wean sedation as tolerated; can also look at changing prop to precedex and see if this is better tolerated pending cEEG findings -abx as above; follow micro data   HFpEF Hx HTN HLD P -supportive care -BP limiting at present; holding antihypertensives, on pressors  -statin, as above   HIV / AIDS Hx +cryptococcal antigen  P -ID following -biktarvy  -fluconazole, bactrim ppx   DM2,  with hyperglycemia P -SSI + lantus 20 u at bedtime  Hypokalemia Hypophosphatemia P -replace, trend   Hx provoked sz (hyperglycemia)  Seizure like activity following PEA arrest 3/19  P -cEEG   Hyperthyroidism r/o  -TSH low T3 normal, T4 normal   THC use  -cessation counseling when able  DNR status     Best practice (evaluated daily):  Diet/type: NPO DVT prophylaxis: LMWH Pressure ulcer(s): pressure ulcer assessment deferred  GI prophylaxis: PPI Lines: N/A Foley:  Yes, and it is still needed Code Status:  DNR Last date of multidisciplinary goals of care discussion: 3/19 IPAL.   Labs   CBC: Recent Labs  Lab 09/26/23 2101 09/26/23 2214 09/27/23 0518 09/28/23 0500 09/29/23 0846 09/30/23 0436 09/30/23 0442  WBC 6.9  --  6.6 6.0 6.5  --  5.5  NEUTROABS 5.7  --   --  4.5 4.9  --   --   HGB 11.7*   < > 11.4* 11.4* 12.0* 10.2* 10.4*  HCT 34.6*   < > 34.6* 35.2* 36.5* 30.0* 30.6*  MCV 87.4  --  89.2 91.0 91.5  --  87.2  PLT 194  --  193 221 236  --  195   < > = values in this interval not displayed.    Basic Metabolic Panel: Recent Labs  Lab 09/26/23 2101 09/26/23 2214 09/27/23 0518 09/28/23 0500 09/29/23 9604  09/30/23 0436 09/30/23 0442  NA 132* 135 133* 133* 133* 142 138  K 3.9 4.2 4.4 4.9 3.8 3.2* 3.1*  CL 96* 98 100 98 97*  --  103  CO2 28  --  25 25 25   --  27  GLUCOSE 230* 226* 249* 174* 157*  --  115*  BUN 18 17 19  32* 28*  --  27*  CREATININE 1.25* 1.20 1.27* 1.24 0.98  --  1.55*  CALCIUM 9.4  --  8.8* 9.4 9.0  --  8.7*  MG  --   --  1.9  --   --   --  2.1  PHOS  --   --  4.6  --   --   --  1.7*   GFR: Estimated Creatinine Clearance (by C-G formula based on SCr of 1.55 mg/dL (H)) Male: 64 mL/min (A) Male: 77.6 mL/min (A) Recent Labs  Lab 09/26/23 2333 09/27/23 0102 09/27/23 0518 09/27/23 0532 09/28/23 0500 09/29/23 0846 09/29/23 1747 09/30/23 0442  PROCALCITON  --   --  0.25  --   --   --   --   --   WBC  --   --  6.6  --  6.0  6.5  --  5.5  LATICACIDVEN 0.5 0.8  --  1.4  --   --  1.9  --     Liver Function Tests: Recent Labs  Lab 09/25/23 0058 09/26/23 2101 09/27/23 0518 09/28/23 0500 09/29/23 0846  AST 29 24 26  33 18  ALT 45* 45* 42 39 31  ALKPHOS 81 78 76 71 71  BILITOT 0.4 0.7 0.5 1.1 0.6  PROT 9.0* 9.3* 9.2* 9.6* 9.1*  ALBUMIN 3.2* 3.3* 3.1* 3.5 3.1*   No results for input(s): "LIPASE", "AMYLASE" in the last 168 hours. Recent Labs  Lab 09/29/23 1747  AMMONIA 30    ABG    Component Value Date/Time   PHART 7.561 (H) 09/30/2023 0436   PCO2ART 32.1 09/30/2023 0436   PO2ART 82 (L) 09/30/2023 0436   HCO3 28.8 (H) 09/30/2023 0436   TCO2 30 09/30/2023 0436   ACIDBASEDEF 8.1 (H) 08/25/2023 1920   O2SAT 98 09/30/2023 0436     Coagulation Profile: Recent Labs  Lab 09/26/23 2101  INR 1.0    Cardiac Enzymes: Recent Labs  Lab 09/27/23 0518  CKTOTAL 67    HbA1C: Hgb A1c MFr Bld  Date/Time Value Ref Range Status  09/29/2023 08:46 AM 10.8 (H) 4.8 - 5.6 % Final    Comment:    (NOTE) Pre diabetes:          5.7%-6.4%  Diabetes:              >6.4%  Glycemic control for   <7.0% adults with diabetes   08/26/2023 03:28 AM 11.7 (H) 4.8 - 5.6 % Final    Comment:    (NOTE) Pre diabetes:          5.7%-6.4%  Diabetes:              >6.4%  Glycemic control for   <7.0% adults with diabetes     CBG: Recent Labs  Lab 09/29/23 1644 09/29/23 1944 09/30/23 0057 09/30/23 0337 09/30/23 0729  GLUCAP 314* 205* 108* 102* 119*    CRITICAL CARE Performed by: Lanier Clam   Total critical care time: 39 minutes  Critical care time was exclusive of separately billable procedures and treating other patients. Critical care was necessary to treat or prevent imminent  or life-threatening deterioration.  Critical care was time spent personally by me on the following activities: development of treatment plan with patient and/or surrogate as well as nursing, discussions with consultants,  evaluation of patient's response to treatment, examination of patient, obtaining history from patient or surrogate, ordering and performing treatments and interventions, ordering and review of laboratory studies, ordering and review of radiographic studies, pulse oximetry and re-evaluation of patient's condition.  Tessie Fass MSN, AGACNP-BC [facility] Pulmonary/Critical Care Medicine Amion for pager 09/30/2023, 10:14 AM

## 2023-09-30 NOTE — Progress Notes (Addendum)
 Nutrition Follow-up  DOCUMENTATION CODES:   Obesity unspecified  INTERVENTION:   Initiate tube feeding via OG tube: Osmolite 1.5 at 75 ml/h x 21 hours (1575 ml per day). Hold tube feed 2 hours prior to [place] and 1 hour after. Initiate Osmolite 1.5 At 35 mL/hr and advance by 10 mL q4 hours.   Prosource TF20 60 ml 1x daily.  Provides 2442 kcal, 118 gm protein, 1185 ml free water daily  D/c Vital HP.   NUTRITION DIAGNOSIS:   Increased nutrient needs related to chronic illness as evidenced by estimated needs. *ongoing  GOAL:   Patient will meet greater than or equal to 90% of their needs *progressing  MONITOR:   TF tolerance  REASON FOR ASSESSMENT:   Consult Enteral/tube feeding initiation and management  ASSESSMENT:   48 y.o. adult with medical history significant of diabetes type 2, seizure disorder, HIV untreated, obesity, polysubstance abuse .  Patient is admitted for pneumonia with hypoxia in the setting of HIV.  Attended rounds and discussed patient with providers. Sedation is held and hopeful for extubation, however pt requiring full vent support at this time.   3/18- Neuro consult, MRI showed acute R medullary infarct. 3/19-  Pt with ongoing aspiration with food despite MBS recommended nectar thick liquid diet. Pt ordered outside food which lead to aspiration event leading to worse hypoxia and PEA arrest; transferred to ICU. Intubated with seizure activity noted following PEA arrest.  3/20- sedation d/c. Trickle feeds started at 20 mL/hr.   OG tube- tip in gastric antrum per XR.   Labs reviewed:  Sodium 135 L BG 102-205   Meds reviewed. Colace BID, Miralax daily, 0-9 units of Novolog, multivitamin,     NUTRITION - FOCUSED PHYSICAL EXAM:  Flowsheet Row Most Recent Value  Orbital Region No depletion  Upper Arm Region No depletion  Thoracic and Lumbar Region No depletion  Buccal Region No depletion  Temple Region Mild depletion  Clavicle Bone Region  No depletion  Clavicle and Acromion Bone Region No depletion  Scapular Bone Region No depletion  Dorsal Hand No depletion  Patellar Region No depletion  Anterior Thigh Region No depletion  Posterior Calf Region No depletion  Edema (RD Assessment) Mild  Hair Reviewed  Eyes Reviewed  Mouth Reviewed  Skin Reviewed  Nails Reviewed       Diet Order:   Diet Order             Diet NPO time specified  Diet effective now                   EDUCATION NEEDS:   Education needs have been addressed  Skin:  Skin Assessment: Reviewed RN Assessment  Last BM:  3/18  Height:   Ht Readings from Last 1 Encounters:  09/29/23 6' (1.829 m)    Weight:   Wt Readings from Last 1 Encounters:  09/29/23 116.4 kg    Ideal Body Weight:     BMI:  Body mass index is 34.8 kg/m.  Estimated Nutritional Needs:   Kcal:  2400-2600  Protein:  110-120g  Fluid:  2.4L/day   Kathrynn Speed, MPH, RD, LDN Clinical Dietitian Contact information on amion

## 2023-09-30 NOTE — Progress Notes (Signed)
 [facility]  Date of Admission:  09/26/2023      Total days of antibiotics 5   ASSESSMENT: Charlies Stevens is a 48 y.o. adult admitted with:   Acute CVA 3/18 -  Seizure -  Fever -  [facility]. Intermittent low grade fevers noted - suspect stress response due to seizure. She is being treated for CAP/aspiration (day 5).  Following for neuroprognosis.   Hypoxia / Respiratory Failure -  Pneumonia -  PEA Arrest 3/19 -  Care per PCCM team appreciated. She is being treated for CA vs aspiration PNA (history of acute CVA and dysphagia recently with hiccoughs). Imaging not concerning for PCP and has been reportedly on her bactrim prophylaxis and biktarvy treatment. Requiring NE following 4 rounds of Epi + CPR with ROSC.  EF 60-65% on Echo yesterday following arrest. No valvular disease. 2/2 pulmonary event.    HIV, AIDS - CD4 36 -  Will change regimen to Tivicay + Descovy so can be crushed VT while she is intubated with any dysphagia she may experience 2/2 stroke.  VL 1.3 million last month will repeat today with reported compliance to see progress made towards viral suppression.  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 Given IV for now.    PCN Allergy (Anaphylaxis) -  Tolerating ceftriaxone fine.   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)   Cardiac arrest, cause unspecified (HCC)   Cerebrovascular accident (CVA) (HCC)   Seizures (HCC)    aspirin  81 mg Per Tube Daily   atorvastatin  20 mg Per Tube Daily   Chlorhexidine Gluconate Cloth  6 each Topical Daily   clopidogrel  75 mg Per Tube Daily   docusate  100 mg Per Tube BID   dolutegravir  50 mg Per Tube Daily   emtricitabine-tenofovir AF  1 tablet Per Tube Daily   enoxaparin (LOVENOX) injection  60 mg Subcutaneous Q24H   feeding supplement (VITAL HIGH PROTEIN)  1,000 mL Per Tube Q24H   insulin aspart  0-9 Units Subcutaneous Q4H   insulin glargine  20 Units Subcutaneous QHS   [START ON 10/01/2023] multivitamin with minerals  1 tablet Per Tube Daily   nicotine  14 mg Transdermal Daily   mouth rinse  15 mL Mouth Rinse Q2H   pneumococcal 20-valent conjugate vaccine  0.5 mL Intramuscular Tomorrow-1000   polyethylene glycol  17 g Per Tube Daily   sulfamethoxazole-trimethoprim  1 tablet Per Tube Daily    SUBJECTIVE: Intubated and sedated   Review of Systems: ROS unable to perform with sedation / intubation    Allergies  Allergen Reactions  Penicillins Anaphylaxis   Peanut-Containing Drug Products Other (See Comments)    WALNUTS - SORES ON TONGUE   Sustiva [Efavirenz] Rash    OBJECTIVE: Vitals:   09/30/23 1030 09/30/23 1038 09/30/23 1100 09/30/23 1130  BP: 100/65  100/64 (!) 83/63  Pulse: 80 87 (!) 103 92  Resp: (!) 21 17 18 18   Temp: 97.9 F (36.6 C) 97.9 F (36.6 C) 97.7 F (36.5 C) 97.7 F (36.5 C)  TempSrc:      SpO2: 98% 93% 98% 98%  Weight:      Height:       Body mass index is 34.8 kg/m.  Physical Exam Constitutional:      Appearance: She is ill-appearing.  Eyes:     Comments: Eyes closed. EEG leads in place  Cardiovascular:     Rate and Rhythm: Normal rate and regular  rhythm.  Pulmonary:     Effort: Pulmonary effort is normal. No respiratory distress.     Breath sounds: Normal breath sounds. No wheezing.  Abdominal:     General: Bowel sounds are normal. There is no distension.     Palpations: Abdomen is soft.  Musculoskeletal:        General: Swelling present.  Skin:    General: Skin is warm.     Lab Results Lab Results  Component Value Date   WBC 5.5 09/30/2023   HGB 10.4 (L) 09/30/2023   HCT 30.6 (L) 09/30/2023   MCV 87.2 09/30/2023   PLT 195 09/30/2023    Lab Results  Component Value Date   CREATININE 1.55 (H) 09/30/2023   BUN 27 (H) 09/30/2023   NA 138 09/30/2023   K 3.1 (L) 09/30/2023   CL 103 09/30/2023   CO2 27 09/30/2023    Lab Results  Component Value Date   ALT 31 09/29/2023   AST 18 09/29/2023   ALKPHOS 71 09/29/2023   BILITOT 0.6 09/29/2023     Microbiology: Recent Results (from the past 240 hours)  Blood culture (routine x 2)     Status: None (Preliminary result)   Collection Time: 09/26/23  9:01 PM   Specimen: BLOOD  Result Value Ref Range Status   Specimen Description   Final    BLOOD LEFT ANTECUBITAL Performed at [facility], [address], [place], [place] [address]    Special Requests   Final    BOTTLES DRAWN AEROBIC AND ANAEROBIC Blood Culture adequate volume Performed at [facility], [address] [place], [place] [address]    Culture   Final    NO GROWTH 4 DAYS Performed at [facility], [address], [place], [place] [address]    Report Status PENDING  Incomplete  Blood culture (routine x 2)     Status: None (Preliminary result)   Collection Time: 09/26/23  9:01 PM   Specimen: BLOOD  Result Value Ref Range Status   Specimen Description   Final    BLOOD RIGHT ANTECUBITAL Performed at [facility], [address], [place], [place] [address]    Special Requests   Final    BOTTLES DRAWN AEROBIC AND ANAEROBIC Blood Culture  adequate volume Performed at [facility], [address], [place], [place] [address]    Culture   Final    NO GROWTH 4 DAYS Performed at [facility], [address], [place], [place] [address]    Report Status PENDING  Incomplete  Resp panel by RT-PCR (RSV,  Flu A&B, Covid)     Status: None   Collection Time: 09/26/23  9:01 PM   Specimen: Nasal Swab  Result Value Ref Range Status   SARS Coronavirus 2 by RT PCR NEGATIVE NEGATIVE Final    Comment: (NOTE) SARS-CoV-2 target nucleic acids are NOT DETECTED.  The SARS-CoV-2 RNA is generally detectable in upper respiratory specimens during the acute phase of infection. The lowest concentration of SARS-CoV-2 viral copies this assay can detect is 138 copies/mL. A negative result does not preclude SARS-Cov-2 infection and should not be used as the sole basis for treatment or other patient management decisions. A negative result may occur with  improper specimen collection/handling, submission of specimen other than nasopharyngeal swab, presence of viral mutation(s) within the areas targeted by this assay, and inadequate number of viral copies(<138 copies/mL). A negative result must be combined with clinical observations, patient history, and epidemiological information. The expected result is Negative.  Fact Sheet for Patients:  BloggerCourse.com  Fact Sheet for Healthcare Providers:  SeriousBroker.it  This test is no t yet approved or cleared by the [place] FDA and  has been authorized for detection and/or diagnosis of SARS-CoV-2 by FDA under an Emergency Use Authorization (EUA). This EUA will remain  in effect (meaning this test can be used) for the duration of the COVID-19 declaration under Section 564(b)(1) of the Act, 21 U.S.C.section 360bbb-3(b)(1), unless the authorization is terminated  or revoked sooner.       Influenza A by PCR NEGATIVE  NEGATIVE Final   Influenza B by PCR NEGATIVE NEGATIVE Final    Comment: (NOTE) The Xpert Xpress SARS-CoV-2/FLU/RSV plus assay is intended as an aid in the diagnosis of influenza from Nasopharyngeal swab specimens and should not be used as a sole basis for treatment. Nasal washings and aspirates are unacceptable for Xpert Xpress SARS-CoV-2/FLU/RSV testing.  Fact Sheet for Patients: BloggerCourse.com  Fact Sheet for Healthcare Providers: SeriousBroker.it  This test is not yet approved or cleared by the [place] FDA and has been authorized for detection and/or diagnosis of SARS-CoV-2 by FDA under an Emergency Use Authorization (EUA). This EUA will remain in effect (meaning this test can be used) for the duration of the COVID-19 declaration under Section 564(b)(1) of the Act, 21 U.S.C. section 360bbb-3(b)(1), unless the authorization is terminated or revoked.     Resp Syncytial Virus by PCR NEGATIVE NEGATIVE Final    Comment: (NOTE) Fact Sheet for Patients: BloggerCourse.com  Fact Sheet for Healthcare Providers: SeriousBroker.it  This test is not yet approved or cleared by the [place] FDA and has been authorized for detection and/or diagnosis of SARS-CoV-2 by FDA under an Emergency Use Authorization (EUA). This EUA will remain in effect (meaning this test can be used) for the duration of the COVID-19 declaration under Section 564(b)(1) of the Act, 21 U.S.C. section 360bbb-3(b)(1), unless the authorization is terminated or revoked.  Performed at [facility], [address] [place], [place] [address]   MRSA Next Gen by PCR, Nasal     Status: None   Collection Time: 09/27/23  8:10 AM   Specimen: Nasal Mucosa; Nasal Swab  Result Value Ref Range Status   MRSA by PCR Next Gen NOT DETECTED NOT DETECTED Final    Comment: (NOTE) The GeneXpert MRSA Assay  (FDA approved for NASAL specimens only), is one component of a comprehensive MRSA colonization surveillance program. It is not intended to diagnose MRSA infection nor to guide or monitor treatment for MRSA infections. Test  performance is not FDA approved in patients less than 62 years old. Performed at [facility], [address], [place], [place] [address]   Expectorated Sputum Assessment w Gram Stain, Rflx to Resp Cult     Status: None   Collection Time: 09/27/23  9:04 AM   Specimen: Sputum  Result Value Ref Range Status   Specimen Description SPU  Final   Special Requests Immunocompromised  Final   Sputum evaluation   Final    THIS SPECIMEN IS ACCEPTABLE FOR SPUTUM CULTURE Performed at [facility], [address] [place], [place] [address]    Report Status 09/27/2023 FINAL  Final  Culture, Respiratory w Gram Stain     Status: None   Collection Time: 09/27/23  9:04 AM   Specimen: Sputum  Result Value Ref Range Status   Specimen Description   Final    SPU Performed at [facility], [address] [place], [place] [address]    Special Requests   Final    Immunocompromised Reflexed from [id number] Performed at [facility], [address], [place], [place] [address]    Gram Stain   Final    ABUNDANT WBC PRESENT,BOTH PMN AND MONONUCLEAR MODERATE SQUAMOUS EPITHELIAL CELLS PRESENT MODERATE GRAM POSITIVE COCCI IN PAIRS RARE GRAM NEGATIVE RODS FEW GRAM POSITIVE RODS    Culture   Final    FEW Normal respiratory flora-no Staph aureus or Pseudomonas seen Performed at [facility], [address], [place], [place] [address]    Report Status 09/29/2023 FINAL  Final  Culture, Respiratory w Gram Stain     Status: None (Preliminary result)   Collection Time: 09/29/23  5:08 PM   Specimen: Tracheal Aspirate; Respiratory  Result Value Ref Range Status   Specimen Description   Final    TRACHEAL  ASPIRATE Performed at [facility], [address], [place], [place] [address]    Special Requests   Final    NONE Performed at [facility], [address] [place], [place] [address]    Gram Stain NO WBC SEEN NO ORGANISMS SEEN   Final   Culture   Final    NO GROWTH < 12 HOURS Performed at [facility], [address] [place], [place] [address]    Report Status PENDING  Incomplete  MRSA Next Gen by PCR, Nasal     Status: None   Collection Time: 09/30/23  1:49 AM   Specimen: Nasal Mucosa; Nasal Swab  Result Value Ref Range Status   MRSA by PCR Next Gen NOT DETECTED NOT DETECTED Final    Comment: (NOTE) The GeneXpert MRSA Assay (FDA approved for NASAL specimens only), is one component of a comprehensive MRSA colonization surveillance program. It is not intended to diagnose MRSA infection nor to guide or monitor treatment for MRSA infections. Test performance is not FDA approved in patients less than 70 years old. Performed at [facility], [address] [place], [place] [address]     Rexene Alberts, MSN, NP-C [facility]  [email or web address] Pager: [phone] Office: [phone] [facility] Main Line: [phone] *Secure Chat Communication Welcome

## 2023-09-30 NOTE — Progress Notes (Signed)
 SLP Cancellation and D/C Note  Patient Details Name: Jeremiah Stevens MRN: [id number] DOB: [birth year]   Cancelled treatment:    Pt intubated yesterday and transferred to [facility] ICU.  SLP will sign off and await new orders if warranted.  Jeremiah Klippel L. Samson Frederic, MA CCC/SLP Clinical Specialist - Acute Care SLP Acute Rehabilitation Services Office number [phone]        Jeremiah Stevens 09/30/2023, 9:45 AM

## 2023-09-30 NOTE — Procedures (Signed)
 Patient Name: Jeremiah Stevens  MRN: [id number]  Epilepsy Attending: Charlsie Quest  Referring Physician/Provider: Kathlene Cote, PA-C  Date: 09/29/2023 Duration: 5 hours 54 mins  Patient history: 48yo M s/o cardiac arrest with seizure like activity. Rapid EEG to evaluate for seizure  Level of alertness:  comatose  AEDs during EEG study: Propofol LEV  Technical aspects: This EEG was obtained using a 10 lead EEG system positioned circumferentially without any parasagittal coverage (rapid EEG). Computer selected EEG is reviewed as  well as background features and all clinically significant events.  Description: EEG showed continuous generalized 3 to 6 Hz theta-delta slowing. Hyperventilation and photic stimulation were not performed.     ABNORMALITY - Continuous slow, generalized  IMPRESSION: This limited ceribell EEG is suggestive of moderate to severe diffuse encephalopathy. No seizures or epileptiform discharges were seen throughout the recording.  Jeremiah Stevens

## 2023-09-30 NOTE — Progress Notes (Addendum)
 LTM maint complete - no skin breakdown seen. [facility] monitored, Event button test confirmed by [facility].

## 2023-09-30 NOTE — Progress Notes (Signed)
 LTM EEG placed after rapid EEG placement. CT compatible leads. [facility] monitoring and test button tested.

## 2023-09-30 NOTE — Plan of Care (Signed)
  Problem: Education: Goal: Ability to describe self-care measures that may prevent or decrease complications (Diabetes Survival Skills Education) will improve Outcome: Progressing Goal: Individualized Educational Video(s) Outcome: Progressing   Problem: Coping: Goal: Ability to adjust to condition or change in health will improve Outcome: Progressing   Problem: Fluid Volume: Goal: Ability to maintain a balanced intake and output will improve Outcome: Progressing   Problem: Health Behavior/Discharge Planning: Goal: Ability to identify and utilize available resources and services will improve Outcome: Progressing Goal: Ability to manage health-related needs will improve Outcome: Progressing   Problem: Metabolic: Goal: Ability to maintain appropriate glucose levels will improve Outcome: Progressing   Problem: Nutritional: Goal: Maintenance of adequate nutrition will improve Outcome: Progressing Goal: Progress toward achieving an optimal weight will improve Outcome: Progressing   Problem: Skin Integrity: Goal: Risk for impaired skin integrity will decrease Outcome: Progressing   Problem: Tissue Perfusion: Goal: Adequacy of tissue perfusion will improve Outcome: Progressing   Problem: Education: Goal: Knowledge of General Education information will improve Description: Including pain rating scale, medication(s)/side effects and non-pharmacologic comfort measures Outcome: Progressing   Problem: Health Behavior/Discharge Planning: Goal: Ability to manage health-related needs will improve Outcome: Progressing   Problem: Clinical Measurements: Goal: Ability to maintain clinical measurements within normal limits will improve Outcome: Progressing Goal: Will remain free from infection Outcome: Progressing Goal: Diagnostic test results will improve Outcome: Progressing Goal: Respiratory complications will improve Outcome: Progressing Goal: Cardiovascular complication will  be avoided Outcome: Progressing   Problem: Activity: Goal: Risk for activity intolerance will decrease Outcome: Progressing   Problem: Nutrition: Goal: Adequate nutrition will be maintained Outcome: Progressing   Problem: Coping: Goal: Level of anxiety will decrease Outcome: Progressing   Problem: Elimination: Goal: Will not experience complications related to bowel motility Outcome: Progressing Goal: Will not experience complications related to urinary retention Outcome: Progressing   Problem: Pain Managment: Goal: General experience of comfort will improve and/or be controlled Outcome: Progressing   Problem: Safety: Goal: Ability to remain free from injury will improve Outcome: Progressing   Problem: Skin Integrity: Goal: Risk for impaired skin integrity will decrease Outcome: Progressing   Problem: Activity: Goal: Ability to tolerate increased activity will improve Outcome: Progressing   Problem: Clinical Measurements: Goal: Ability to maintain a body temperature in the normal range will improve Outcome: Progressing   Problem: Respiratory: Goal: Ability to maintain adequate ventilation will improve Outcome: Progressing Goal: Ability to maintain a clear airway will improve Outcome: Progressing   Problem: Education: Goal: Knowledge of disease or condition will improve Outcome: Progressing Goal: Knowledge of secondary prevention will improve (MUST DOCUMENT ALL) Outcome: Progressing Goal: Knowledge of patient specific risk factors will improve (DELETE if not current risk factor) Outcome: Progressing   Problem: Ischemic Stroke/TIA Tissue Perfusion: Goal: Complications of ischemic stroke/TIA will be minimized Outcome: Progressing   Problem: Coping: Goal: Will verbalize positive feelings about self Outcome: Progressing Goal: Will identify appropriate support needs Outcome: Progressing   Problem: Health Behavior/Discharge Planning: Goal: Ability to manage  health-related needs will improve Outcome: Progressing Goal: Goals will be collaboratively established with patient/family Outcome: Progressing

## 2023-09-30 NOTE — Progress Notes (Signed)
 Chaplain initially responded to code blue for Jeremiah Stevens and remained present when family arrived soon after. Chaplain provided emotional support to family including [name]'s siblings (brother and sister) as well as his [place] and his uncle.  They were understandably shocked and were doing their best to support each other and [name]. They stated that they were okay for the time being.  Chaplain offered to check on them in the morning and they were appreciative.

## 2023-09-30 NOTE — Progress Notes (Signed)
 PT Cancellation Note  Patient Details Name: Khori Rosevear MRN: [id number] DOB: 12-Mar-1976   Cancelled Treatment:    Reason Eval/Treat Not Completed: Patient not medically ready; patient sedated on vent.  Will follow up another day.   Elray Mcgregor 09/30/2023, 10:01 AM Sheran Lawless, PT Acute Rehabilitation Services Office:[phone] 09/30/2023

## 2023-09-30 NOTE — Progress Notes (Signed)
 Called and informed the patient's sister that she is on her way to [facility], [place]

## 2023-09-30 NOTE — Progress Notes (Signed)
  Progress Note   Date: 09/29/2023  Patient Name: Jeremiah Stevens        MRN#: [id number]  Clarification of diagnosis: Anemia of chronic disease

## 2023-09-30 NOTE — Progress Notes (Signed)
   09/30/23 1511  Spiritual Encounters  Type of Visit Initial  Care provided to: Pt and family  Reason for visit Routine spiritual support  OnCall Visit No   Visited with patient to follow up from care at [facility]. Patient had visitors in the room. Spoke briefly with patient and said a prayer, provided spiritual support to patient and family. Advised patient that I could return later as visitor were wanting more time alone with patient. Advised patient I will return when visitors left and she was alone so we could have more time together. Patient nodded in agreement and squeezed chaplain's hand.

## 2023-10-01 ENCOUNTER — Inpatient Hospital Stay (HOSPITAL_COMMUNITY)

## 2023-10-01 DIAGNOSIS — J9601 Acute respiratory failure with hypoxia: Secondary | ICD-10-CM | POA: Diagnosis not present

## 2023-10-01 DIAGNOSIS — Z880 Allergy status to penicillin: Secondary | ICD-10-CM

## 2023-10-01 DIAGNOSIS — J189 Pneumonia, unspecified organism: Secondary | ICD-10-CM | POA: Diagnosis not present

## 2023-10-01 DIAGNOSIS — R509 Fever, unspecified: Secondary | ICD-10-CM | POA: Diagnosis not present

## 2023-10-01 DIAGNOSIS — R1310 Dysphagia, unspecified: Secondary | ICD-10-CM | POA: Diagnosis not present

## 2023-10-01 DIAGNOSIS — R569 Unspecified convulsions: Secondary | ICD-10-CM | POA: Diagnosis not present

## 2023-10-01 DIAGNOSIS — I5030 Unspecified diastolic (congestive) heart failure: Secondary | ICD-10-CM

## 2023-10-01 DIAGNOSIS — I639 Cerebral infarction, unspecified: Secondary | ICD-10-CM | POA: Diagnosis not present

## 2023-10-01 DIAGNOSIS — E876 Hypokalemia: Secondary | ICD-10-CM

## 2023-10-01 DIAGNOSIS — I469 Cardiac arrest, cause unspecified: Secondary | ICD-10-CM | POA: Diagnosis not present

## 2023-10-01 LAB — BASIC METABOLIC PANEL
Anion gap: 11 (ref 5–15)
BUN: 27 mg/dL — ABNORMAL HIGH (ref 6–20)
CO2: 25 mmol/L (ref 22–32)
Calcium: 8.6 mg/dL — ABNORMAL LOW (ref 8.9–10.3)
Chloride: 105 mmol/L (ref 98–111)
Creatinine, Ser: 1.56 mg/dL — ABNORMAL HIGH (ref 0.61–1.24)
GFR, Estimated: 55 mL/min — ABNORMAL LOW (ref >60)
Glucose, Bld: 223 mg/dL — ABNORMAL HIGH (ref 70–99)
Potassium: 3.5 mmol/L (ref 3.5–5.1)
Sodium: 141 mmol/L (ref 135–145)

## 2023-10-01 LAB — CULTURE, BLOOD (ROUTINE X 2)
Culture: NO GROWTH
Culture: NO GROWTH
Special Requests: ADEQUATE
Special Requests: ADEQUATE

## 2023-10-01 LAB — POCT I-STAT 7, (LYTES, BLD GAS, ICA,H+H)
Acid-Base Excess: 4 mmol/L — ABNORMAL HIGH (ref 0.0–2.0)
Bicarbonate: 27.8 mmol/L (ref 20.0–28.0)
Calcium, Ion: 1.2 mmol/L (ref 1.15–1.40)
HCT: 30 % — ABNORMAL LOW (ref 39.0–52.0)
Hemoglobin: 10.2 g/dL — ABNORMAL LOW (ref 13.0–17.0)
O2 Saturation: 100 %
Potassium: 4.5 mmol/L (ref 3.5–5.1)
Sodium: 142 mmol/L (ref 135–145)
TCO2: 29 mmol/L (ref 22–32)
pCO2 arterial: 35.7 mmHg (ref 32–48)
pH, Arterial: 7.5 — ABNORMAL HIGH (ref 7.35–7.45)
pO2, Arterial: 239 mmHg — ABNORMAL HIGH (ref 83–108)

## 2023-10-01 LAB — CBC
HCT: 28.1 % — ABNORMAL LOW (ref 39.0–52.0)
Hemoglobin: 9.4 g/dL — ABNORMAL LOW (ref 13.0–17.0)
MCH: 29.4 pg (ref 26.0–34.0)
MCHC: 33.5 g/dL (ref 30.0–36.0)
MCV: 87.8 fL (ref 80.0–100.0)
Platelets: 159 10*3/uL (ref 150–400)
RBC: 3.2 MIL/uL — ABNORMAL LOW (ref 4.22–5.81)
RDW: 15 % (ref 11.5–15.5)
WBC: 4.4 10*3/uL (ref 4.0–10.5)
nRBC: 0 % (ref 0.0–0.2)

## 2023-10-01 LAB — GLUCOSE, CAPILLARY
Glucose-Capillary: 167 mg/dL — ABNORMAL HIGH (ref 70–99)
Glucose-Capillary: 202 mg/dL — ABNORMAL HIGH (ref 70–99)
Glucose-Capillary: 170 mg/dL — ABNORMAL HIGH (ref 70–99)
Glucose-Capillary: 133 mg/dL — ABNORMAL HIGH (ref 70–99)
Glucose-Capillary: 238 mg/dL — ABNORMAL HIGH (ref 70–99)

## 2023-10-01 LAB — MAGNESIUM: Magnesium: 2.1 mg/dL (ref 1.7–2.4)

## 2023-10-01 LAB — PHOSPHORUS: Phosphorus: 2.1 mg/dL — ABNORMAL LOW (ref 2.5–4.6)

## 2023-10-01 MED ORDER — MIDAZOLAM HCL 2 MG/2ML IJ SOLN
INTRAMUSCULAR | Status: AC
Start: 2023-10-01 — End: 2023-10-01
  Administered 2023-10-01: 2 mg via INTRAVENOUS
  Filled 2023-10-01: qty 2

## 2023-10-01 MED ORDER — FENTANYL CITRATE PF 50 MCG/ML IJ SOSY
50.0000 ug | PREFILLED_SYRINGE | INTRAMUSCULAR | Status: DC | PRN
  Administered 2023-10-01 (×2): 100 ug via INTRAVENOUS
  Administered 2023-10-01: 50 ug via INTRAVENOUS
  Administered 2023-10-01 – 2023-10-02 (×2): 100 ug via INTRAVENOUS
  Administered 2023-10-03: 50 ug via INTRAVENOUS
  Administered 2023-10-03: 200 ug via INTRAVENOUS
  Administered 2023-10-03: 50 ug via INTRAVENOUS
  Administered 2023-10-04 – 2023-10-06 (×4): 100 ug via INTRAVENOUS
  Administered 2023-10-07: 50 ug via INTRAVENOUS
  Administered 2023-10-08 (×2): 200 ug via INTRAVENOUS
  Administered 2023-10-08 (×2): 100 ug via INTRAVENOUS
  Administered 2023-10-08: 50 ug via INTRAVENOUS
  Administered 2023-10-09: 100 ug via INTRAVENOUS
  Administered 2023-10-09: 50 ug via INTRAVENOUS
  Administered 2023-10-11: 100 ug via INTRAVENOUS
  Administered 2023-10-11: 50 ug via INTRAVENOUS
  Administered 2023-10-12 (×4): 100 ug via INTRAVENOUS
  Filled 2023-10-01: qty 2
  Filled 2023-10-01: qty 4
  Filled 2023-10-01 (×2): qty 2
  Filled 2023-10-01: qty 1
  Filled 2023-10-01: qty 4
  Filled 2023-10-01 (×2): qty 2
  Filled 2023-10-01: qty 4
  Filled 2023-10-01 (×6): qty 2
  Filled 2023-10-01: qty 1
  Filled 2023-10-01: qty 2
  Filled 2023-10-01: qty 1
  Filled 2023-10-01: qty 2
  Filled 2023-10-01: qty 1
  Filled 2023-10-01 (×5): qty 2

## 2023-10-01 MED ORDER — ROCURONIUM BROMIDE 10 MG/ML (PF) SYRINGE
PREFILLED_SYRINGE | INTRAVENOUS | Status: AC
  Administered 2023-10-01: 100 mg via INTRAVENOUS
  Filled 2023-10-01: qty 10

## 2023-10-01 MED ORDER — ETOMIDATE 2 MG/ML IV SOLN
INTRAVENOUS | Status: AC
  Administered 2023-10-01: 40 mg via INTRAVENOUS
  Filled 2023-10-01: qty 20

## 2023-10-01 MED ORDER — ETOMIDATE 2 MG/ML IV SOLN: 20.0000 mg | Freq: Once | INTRAVENOUS | Status: DC

## 2023-10-01 MED ORDER — FENTANYL CITRATE PF 50 MCG/ML IJ SOSY
50.0000 ug | PREFILLED_SYRINGE | Freq: Once | INTRAMUSCULAR | Status: AC
  Administered 2023-10-01: 50 ug via INTRAVENOUS

## 2023-10-01 MED ORDER — KETAMINE HCL 50 MG/5ML IJ SOSY
PREFILLED_SYRINGE | INTRAMUSCULAR | Status: AC
  Filled 2023-10-01: qty 10

## 2023-10-01 MED ORDER — MIDAZOLAM HCL 2 MG/2ML IJ SOLN
1.0000 mg | INTRAMUSCULAR | Status: DC | PRN
  Administered 2023-10-03: 2 mg via INTRAVENOUS
  Filled 2023-10-01 (×2): qty 2

## 2023-10-01 MED ORDER — FENTANYL CITRATE PF 50 MCG/ML IJ SOSY
PREFILLED_SYRINGE | INTRAMUSCULAR | Status: AC
  Administered 2023-10-01: 50 ug via INTRAVENOUS
  Filled 2023-10-01: qty 2

## 2023-10-01 MED ORDER — DEXMEDETOMIDINE HCL IN NACL 400 MCG/100ML IV SOLN
0.0000 ug/kg/h | INTRAVENOUS | Status: DC
  Administered 2023-10-01 (×2): 0.4 ug/kg/h via INTRAVENOUS
  Administered 2023-10-01: 0.7 ug/kg/h via INTRAVENOUS
  Administered 2023-10-02 (×6): 0.8 ug/kg/h via INTRAVENOUS
  Administered 2023-10-03: 0.6 ug/kg/h via INTRAVENOUS
  Administered 2023-10-03: 1.2 ug/kg/h via INTRAVENOUS
  Administered 2023-10-03: 0.8 ug/kg/h via INTRAVENOUS
  Administered 2023-10-03 (×2): 1.2 ug/kg/h via INTRAVENOUS
  Administered 2023-10-04: 0.7 ug/kg/h via INTRAVENOUS
  Administered 2023-10-04: 0.6 ug/kg/h via INTRAVENOUS
  Administered 2023-10-04: 1.2 ug/kg/h via INTRAVENOUS
  Administered 2023-10-04: 0.7 ug/kg/h via INTRAVENOUS
  Administered 2023-10-04: 0.9 ug/kg/h via INTRAVENOUS
  Administered 2023-10-04: 1.2 ug/kg/h via INTRAVENOUS
  Administered 2023-10-05 (×2): 0.5 ug/kg/h via INTRAVENOUS
  Administered 2023-10-05 (×2): 0.8 ug/kg/h via INTRAVENOUS
  Filled 2023-10-01 (×12): qty 100
  Filled 2023-10-01 (×2): qty 200
  Filled 2023-10-01: qty 100
  Filled 2023-10-01: qty 200
  Filled 2023-10-01 (×4): qty 100

## 2023-10-01 MED ORDER — POLYETHYLENE GLYCOL 3350 17 G PO PACK
17.0000 g | PACK | Freq: Two times a day (BID) | ORAL | Status: DC
  Administered 2023-10-01 – 2023-10-05 (×10): 17 g
  Filled 2023-10-01 (×8): qty 1

## 2023-10-01 MED ORDER — ETOMIDATE 2 MG/ML IV SOLN
40.0000 mg | Freq: Once | INTRAVENOUS | Status: AC
Start: 2023-10-01 — End: 2023-10-01

## 2023-10-01 MED ORDER — PHENYLEPHRINE 80 MCG/ML (10ML) SYRINGE FOR IV PUSH (FOR BLOOD PRESSURE SUPPORT)
PREFILLED_SYRINGE | INTRAVENOUS | Status: AC
  Filled 2023-10-01: qty 10

## 2023-10-01 MED ORDER — ROCURONIUM BROMIDE 10 MG/ML (PF) SYRINGE: 100.0000 mg | PREFILLED_SYRINGE | Freq: Once | INTRAVENOUS | Status: AC

## 2023-10-01 MED ORDER — RACEPINEPHRINE HCL 2.25 % IN NEBU
INHALATION_SOLUTION | RESPIRATORY_TRACT | Status: AC
  Administered 2023-10-01: 0.5 mL via RESPIRATORY_TRACT
  Filled 2023-10-01: qty 0.5

## 2023-10-01 MED ORDER — POTASSIUM PHOSPHATES 15 MMOLE/5ML IV SOLN
30.0000 mmol | Freq: Once | INTRAVENOUS | Status: AC
  Administered 2023-10-01: 30 mmol via INTRAVENOUS
  Filled 2023-10-01: qty 10

## 2023-10-01 MED ORDER — METHYLPREDNISOLONE SODIUM SUCC 40 MG IJ SOLR
40.0000 mg | Freq: Every day | INTRAMUSCULAR | Status: DC
  Administered 2023-10-01 – 2023-10-03 (×3): 40 mg via INTRAVENOUS
  Filled 2023-10-01 (×3): qty 1

## 2023-10-01 MED ORDER — RACEPINEPHRINE HCL 2.25 % IN NEBU: 0.5000 mL | INHALATION_SOLUTION | Freq: Once | RESPIRATORY_TRACT | Status: AC

## 2023-10-01 MED ORDER — DEXAMETHASONE SODIUM PHOSPHATE 10 MG/ML IJ SOLN: 6.0000 mg | Freq: Once | INTRAMUSCULAR | Status: DC

## 2023-10-01 MED ORDER — MIDAZOLAM HCL 2 MG/2ML IJ SOLN: 2.0000 mg | Freq: Once | INTRAMUSCULAR | Status: AC

## 2023-10-01 MED ORDER — SUCCINYLCHOLINE CHLORIDE 200 MG/10ML IV SOSY
PREFILLED_SYRINGE | INTRAVENOUS | Status: AC
  Filled 2023-10-01: qty 10

## 2023-10-01 MED ORDER — FENTANYL CITRATE PF 50 MCG/ML IJ SOSY: 100.0000 ug | PREFILLED_SYRINGE | Freq: Once | INTRAMUSCULAR | Status: DC

## 2023-10-01 MED ORDER — FENTANYL CITRATE PF 50 MCG/ML IJ SOSY: 50.0000 ug | PREFILLED_SYRINGE | Freq: Once | INTRAMUSCULAR | Status: AC

## 2023-10-01 NOTE — Progress Notes (Signed)
 [facility]  Date of Admission:  09/26/2023      Total days of antibiotics 5   ASSESSMENT: Jeremiah Stevens is a 48 y.o. adult admitted with:   Acute CVA 3/18 -  Seizure -  Fever -  [facility] ICU. Intermittent low grade fevers noted - suspect stress response due to seizure vs aspiration. No OIs suspected - treated for asp pna vs cap with ceftriaxone + metronidazole,day 6 (EOT: 3/22) Following some simple commands. Tolerating SBTs, following for extubation. -ctx + met through 3/22 -continue bactrim for pjp prevention   Hypoxia / Respiratory Failure -  Pneumonia -  PEA Arrest 3/19 -  Care per [facility] team appreciated. She is being treated for CA vs aspiration PNA (history of acute CVA and dysphagia recently with hiccoughs). Imaging not concerning for PCP and has been reportedly on her bactrim prophylaxis and biktarvy treatment. Requiring NE following 4 rounds of Epi + CPR with ROSC.  EF 60-65% on Echo 3/19 following arrest. No valvular disease. 2/2 pulmonary event.   HIV, AIDS - CD4 36 -  Will continue Tivicay + Descovy so can be crushed VT while she is intubated with any dysphagia she may experience 2/2 stroke requiring ongoing cortrak VL 1.3 million last month >> repeat pending -continue bactrim prophy  -continue Tivicay + Descovy together crushed VT.   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 Given IV for now.    PCN Allergy (Anaphylaxis) -  Tolerating ceftriaxone fine.   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)   Uncontrolled type 2 diabetes mellitus with hyperglycemia, without long-term current use of insulin (HCC)   Tobacco abuse   Seizure (HCC)   Acute hypoxic respiratory failure (HCC)   Globus sensation   Aspiration pneumonia of both lungs due to vomit [facility])   Cardiac arrest, cause unspecified (HCC)   Cerebrovascular accident (CVA) (HCC)   Seizures (HCC)    aspirin  81 mg Per Tube Daily   atorvastatin  20 mg Per Tube Daily   Chlorhexidine Gluconate Cloth  6 each Topical Daily   clopidogrel  75 mg Per Tube Daily   docusate  100 mg Per Tube BID   dolutegravir  50 mg Per Tube Daily   emtricitabine-tenofovir AF  1 tablet Per Tube Daily   enoxaparin (LOVENOX) injection  60 mg Subcutaneous Q24H   feeding supplement (PROSource TF20)  60 mL Per Tube Daily   insulin aspart  0-9 Units Subcutaneous Q4H   insulin glargine  20 Units Subcutaneous QHS   levalbuterol  0.63 mg Nebulization Q6H   multivitamin with minerals  1 tablet Per Tube Daily   nicotine  14 mg Transdermal Daily   mouth rinse  15 mL Mouth Rinse Q2H   pneumococcal 20-valent conjugate vaccine  0.5 mL Intramuscular Tomorrow-1000   polyethylene glycol  17 g Per Tube BID   sulfamethoxazole-trimethoprim  1 tablet Per Tube Daily  SUBJECTIVE: Intubated and sedated   Review of Systems: ROS unable to perform with sedation / intubation    Allergies  Allergen Reactions   Penicillins Anaphylaxis   Peanut-Containing Drug Products Other (See Comments)    WALNUTS - SORES ON TONGUE   Sustiva [Efavirenz] Rash    OBJECTIVE: Vitals:   10/01/23 1059 10/01/23 1100 10/01/23 1200 10/01/23 1247  BP:  121/78 123/88 115/73  Pulse:  91 94 (!) 102  Resp:  (!) 22 (!) 32 (!) 29  Temp:  (!) 96.8 F (36 C) 98.2 F (36.8 C) 99 F (37.2 C)  TempSrc:      SpO2: 95% 95% 96% 95%  Weight:      Height:       Body mass index is 32.08 kg/m.  Physical Exam Constitutional:       Appearance: She is ill-appearing.  Eyes:     Comments: Eyes closed. EEG leads in place  Cardiovascular:     Rate and Rhythm: Normal rate and regular rhythm.  Pulmonary:     Effort: Pulmonary effort is normal. No respiratory distress.     Breath sounds: Normal breath sounds. No wheezing.  Abdominal:     General: Bowel sounds are normal. There is no distension.     Palpations: Abdomen is soft.  Musculoskeletal:        General: Swelling present.  Skin:    General: Skin is warm.     Lab Results Lab Results  Component Value Date   WBC 4.4 10/01/2023   HGB 9.4 (L) 10/01/2023   HCT 28.1 (L) 10/01/2023   MCV 87.8 10/01/2023   PLT 159 10/01/2023    Lab Results  Component Value Date   CREATININE 1.56 (H) 10/01/2023   BUN 27 (H) 10/01/2023   NA 141 10/01/2023   K 3.5 10/01/2023   CL 105 10/01/2023   CO2 25 10/01/2023    Lab Results  Component Value Date   ALT 31 09/29/2023   AST 18 09/29/2023   ALKPHOS 71 09/29/2023   BILITOT 0.6 09/29/2023     Microbiology: Recent Results (from the past 240 hours)  Blood culture (routine x 2)     Status: None   Collection Time: 09/26/23  9:01 PM   Specimen: BLOOD  Result Value Ref Range Status   Specimen Description   Final    BLOOD LEFT ANTECUBITAL Performed at [facility], [address], [place], [place] [address]    Special Requests   Final    BOTTLES DRAWN AEROBIC AND ANAEROBIC Blood Culture adequate volume Performed at [facility], [address], [place], [place] [address]    Culture   Final    NO GROWTH 5 DAYS Performed at [facility], [address] [place], [place] [address]    Report Status 10/01/2023 FINAL  Final  Blood culture (routine x 2)     Status: None   Collection Time: 09/26/23  9:01 PM   Specimen: BLOOD  Result Value Ref Range Status   Specimen Description   Final    BLOOD RIGHT ANTECUBITAL Performed at [facility], [address],  [place], [place] [address]    Special Requests   Final    BOTTLES DRAWN AEROBIC AND ANAEROBIC Blood Culture adequate volume Performed at [facility], [address] [place], [place] [address]    Culture   Final    NO GROWTH 5 DAYS Performed at [facility],  [address], [place], [place] [address]    Report Status 10/01/2023 FINAL  Final  Resp panel by RT-PCR (RSV, Flu A&B, Covid)     Status: None   Collection Time: 09/26/23  9:01 PM   Specimen: Nasal Swab  Result Value Ref Range Status   SARS Coronavirus 2 by RT PCR NEGATIVE NEGATIVE Final    Comment: (NOTE) SARS-CoV-2 target nucleic acids are NOT DETECTED.  The SARS-CoV-2 RNA is generally detectable in upper respiratory specimens during the acute phase of infection. The lowest concentration of SARS-CoV-2 viral copies this assay can detect is 138 copies/mL. A negative result does not preclude SARS-Cov-2 infection and should not be used as the sole basis for treatment or other patient management decisions. A negative result may occur with  improper specimen collection/handling, submission of specimen other than nasopharyngeal swab, presence of viral mutation(s) within the areas targeted by this assay, and inadequate number of viral copies(<138 copies/mL). A negative result must be combined with clinical observations, patient history, and epidemiological information. The expected result is Negative.  Fact Sheet for Patients:  BloggerCourse.com  Fact Sheet for Healthcare Providers:  SeriousBroker.it  This test is no t yet approved or cleared by the [place] FDA and  has been authorized for detection and/or diagnosis of SARS-CoV-2 by FDA under an Emergency Use Authorization (EUA). This EUA will remain  in effect (meaning this test can be used) for the duration of the COVID-19 declaration under Section 564(b)(1) of the Act, 21 U.S.C.section  360bbb-3(b)(1), unless the authorization is terminated  or revoked sooner.       Influenza A by PCR NEGATIVE NEGATIVE Final   Influenza B by PCR NEGATIVE NEGATIVE Final    Comment: (NOTE) The Xpert Xpress SARS-CoV-2/FLU/RSV plus assay is intended as an aid in the diagnosis of influenza from Nasopharyngeal swab specimens and should not be used as a sole basis for treatment. Nasal washings and aspirates are unacceptable for Xpert Xpress SARS-CoV-2/FLU/RSV testing.  Fact Sheet for Patients: BloggerCourse.com  Fact Sheet for Healthcare Providers: SeriousBroker.it  This test is not yet approved or cleared by the [place] FDA and has been authorized for detection and/or diagnosis of SARS-CoV-2 by FDA under an Emergency Use Authorization (EUA). This EUA will remain in effect (meaning this test can be used) for the duration of the COVID-19 declaration under Section 564(b)(1) of the Act, 21 U.S.C. section 360bbb-3(b)(1), unless the authorization is terminated or revoked.     Resp Syncytial Virus by PCR NEGATIVE NEGATIVE Final    Comment: (NOTE) Fact Sheet for Patients: BloggerCourse.com  Fact Sheet for Healthcare Providers: SeriousBroker.it  This test is not yet approved or cleared by the [place] FDA and has been authorized for detection and/or diagnosis of SARS-CoV-2 by FDA under an Emergency Use Authorization (EUA). This EUA will remain in effect (meaning this test can be used) for the duration of the COVID-19 declaration under Section 564(b)(1) of the Act, 21 U.S.C. section 360bbb-3(b)(1), unless the authorization is terminated or revoked.  Performed at [facility], [address] [place], [place] [address]   MRSA Next Gen by PCR, Nasal     Status: None   Collection Time: 09/27/23  8:10 AM   Specimen: Nasal Mucosa; Nasal Swab  Result Value Ref  Range Status   MRSA by PCR Next Gen NOT DETECTED NOT DETECTED Final    Comment: (NOTE) The GeneXpert MRSA Assay (FDA approved for NASAL specimens only), is one component of a comprehensive  MRSA colonization surveillance program. It is not intended to diagnose MRSA infection nor to guide or monitor treatment for MRSA infections. Test performance is not FDA approved in patients less than 75 years old. Performed at [facility], [address], [place], [place] [address]   Expectorated Sputum Assessment w Gram Stain, Rflx to Resp Cult     Status: None   Collection Time: 09/27/23  9:04 AM   Specimen: Sputum  Result Value Ref Range Status   Specimen Description SPU  Final   Special Requests Immunocompromised  Final   Sputum evaluation   Final    THIS SPECIMEN IS ACCEPTABLE FOR SPUTUM CULTURE Performed at [facility], [address] [place], [place] [address]    Report Status 09/27/2023 FINAL  Final  Culture, Respiratory w Gram Stain     Status: None   Collection Time: 09/27/23  9:04 AM   Specimen: Sputum  Result Value Ref Range Status   Specimen Description   Final    SPU Performed at [facility], [address], [place], [place] [address]    Special Requests   Final    Immunocompromised Reflexed from [phone] Performed at [facility], [address], [place], [place] [address]    Gram Stain   Final    ABUNDANT WBC PRESENT,BOTH PMN AND MONONUCLEAR MODERATE SQUAMOUS EPITHELIAL CELLS PRESENT MODERATE GRAM POSITIVE COCCI IN PAIRS RARE GRAM NEGATIVE RODS FEW GRAM POSITIVE RODS    Culture   Final    FEW Normal respiratory flora-no Staph aureus or Pseudomonas seen Performed at [facility], [address] [place], [place] [address]    Report Status 09/29/2023 FINAL  Final  Culture, Respiratory w Gram Stain     Status: None (Preliminary result)   Collection Time: 09/29/23  5:08 PM   Specimen:  Tracheal Aspirate; Respiratory  Result Value Ref Range Status   Specimen Description   Final    TRACHEAL ASPIRATE Performed at [facility], [address], [place], [place] [address]    Special Requests   Final    NONE Performed at [facility], [address], [place], [place] [address]    Gram Stain NO WBC SEEN NO ORGANISMS SEEN   Final   Culture   Final    CULTURE REINCUBATED FOR BETTER GROWTH Performed at [facility], [address], [place], [place] [address]    Report Status PENDING  Incomplete  MRSA Next Gen by PCR, Nasal     Status: None   Collection Time: 09/30/23  1:49 AM   Specimen: Nasal Mucosa; Nasal Swab  Result Value Ref Range Status   MRSA by PCR Next Gen NOT DETECTED NOT DETECTED Final    Comment: (NOTE) The GeneXpert MRSA Assay (FDA approved for NASAL specimens only), is one component of a comprehensive MRSA colonization surveillance program. It is not intended to diagnose MRSA infection nor to guide or monitor treatment for MRSA infections. Test performance is not FDA approved in patients less than 1 years old. Performed at [facility], [address], [place], [place] [address]     Rexene Alberts, MSN, NP-C [facility]  [email or web address] Pager: [phone] Office: [phone] [facility] Main Line: [phone] *Secure Chat Communication Welcome

## 2023-10-01 NOTE — Procedures (Addendum)
 Patient Name: Jeremiah Stevens  MRN: [id number]  Epilepsy Attending: Charlsie Quest  Referring Physician/Provider: Caryl Pina, MD   Duration: 09/30/2023 4098 to 10/01/2023 1191  Patient history: 48yo M s/o cardiac arrest with seizure like activity. EEG to evaluate for seizure   Level of alertness:  awake, asleep  AEDs during EEG study: Propofol, LEV  Technical aspects: This EEG study was done with scalp electrodes positioned according to the 10-20 International system of electrode placement. Electrical activity was reviewed with band pass filter of 1-70Hz , sensitivity of 7 uV/mm, display speed of 3mm/sec with a 60Hz  notched filter applied as appropriate. EEG data were recorded continuously and digitally stored.  Video monitoring was available and reviewed as appropriate.  Description: The posterior dominant rhythm consists of 8 Hz activity of moderate voltage (25-35 uV) seen predominantly in posterior head regions, symmetric and reactive to eye opening and eye closing. Sleep was characterized by vertex waves, sleep spindles (12 to 14 Hz), maximal frontocentral region.  EEG showed continuous/intermittent generalized polymorphic sharply contoured 3 to 6 Hz theta-delta slowing. Hyperventilation and photic stimulation were not performed.     ABNORMALITY - Intermittent slow, generalized  IMPRESSION: This study is suggestive of mild to moderate diffuse encephalopathy. No seizures or epileptiform discharges were seen throughout the recording.  Martina Brodbeck Annabelle Harman

## 2023-10-01 NOTE — Procedures (Signed)
 Cortrak  Person Inserting Tube:  Mahala Menghini, RD Tube Type:  Cortrak - 43 inches Tube Size:  10 Tube Location:  Left nare Initial Placement:  Stomach Secured by: Bridle Technique Used to Measure Tube Placement:  Marking at nare/corner of mouth Cortrak Secured At:  71 cm   Cortrak Tube Team Note:  Consult received to place a Cortrak feeding tube.   No x-ray is required. RN may begin using tube.   If the tube becomes dislodged please keep the tube and contact the Cortrak team at [email or web address] for replacement.  If after hours and replacement cannot be delayed, place a NG tube and confirm placement with an abdominal x-ray.    Mertie Clause, MS, RD, LDN Registered Dietitian II Please see AMiON for contact information.

## 2023-10-01 NOTE — Progress Notes (Signed)
 Called to bedside, s/p self extubated.  Stridorous, minimal air movement, awake/ eyes open, sats initially ok.  Getting racemic neb with plans to place on BiPAP  Spoke with NOK, sister Jeremiah Stevens, updated.  Reversed to full code.   Pt started to desaturate with worsening WOB requiring reintubation by Dr. Francine Graven.   Attempted to call sister back for update, went straight to VM which was not set up.    - pending CXR post intubation      Jeremiah Boyer, MSN, AG-ACNP-BC [facility] 10/01/2023, 1:57 PM  See Amion for pager If no response to pager , please call [phone] until 7pm After 7:00 pm call Elink  [phone]

## 2023-10-01 NOTE — Plan of Care (Signed)
  Problem: Fluid Volume: Goal: Ability to maintain a balanced intake and output will improve Outcome: Progressing   Problem: Metabolic: Goal: Ability to maintain appropriate glucose levels will improve Outcome: Progressing   Problem: Nutritional: Goal: Maintenance of adequate nutrition will improve Outcome: Progressing Goal: Progress toward achieving an optimal weight will improve Outcome: Progressing   Problem: Skin Integrity: Goal: Risk for impaired skin integrity will decrease Outcome: Progressing   Problem: Tissue Perfusion: Goal: Adequacy of tissue perfusion will improve Outcome: Progressing   Problem: Education: Goal: Ability to describe self-care measures that may prevent or decrease complications (Diabetes Survival Skills Education) will improve Outcome: Not Progressing Goal: Individualized Educational Video(s) Outcome: Not Progressing   Problem: Coping: Goal: Ability to adjust to condition or change in health will improve Outcome: Not Progressing   Problem: Health Behavior/Discharge Planning: Goal: Ability to identify and utilize available resources and services will improve Outcome: Not Progressing Goal: Ability to manage health-related needs will improve Outcome: Not Progressing

## 2023-10-01 NOTE — Progress Notes (Signed)
 NAME:  Jeremiah Stevens, MRN:  [id number], DOB:  09-09-1975, LOS: 5 ADMISSION DATE:  09/26/2023, CONSULTATION DATE:  09/29/23 REFERRING MD:  Rennis Chris CHIEF COMPLAINT:  Hypoxia   History of Present Illness:  Pt is encephelopathic; therefore, this HPI is obtained from chart review. Jeremiah Stevens is a 48 y.o. adult who has a PMH as below including but not limited to DM2, seizures, HIV AIDS untreated, cryptococcal disease, obesity, polysubstance abuse, prior CVA. She was admitted 09/26/23 with dysphagia x 3 days along with headaches and drooling. He was found to be hypoxic to 80s on room air.  CTA was negative for PE but showed multifocal PNA. He was started on abx and BD's. Biktarvy was continued and ID was consulted.   3/18 He had right facial droop, right sided weakness, dysarthria. Neurology was consulted and recommended CTA and MRI. MRI demonstrated acute right medullary infarct.  Despite ongoing dysphagia (MBS showed moderately severe pharyngeal phase dysphagia and imaging showed unilateral bulging on right), pt insisted on eating. He reportedly ordered pizza overnight 3/18 and certainly aspirated. He had threatened to leave AMA despite stroke workup ongoing.  3/19, he had hypoxia for which rapid response was called. While being assess, he continued to desaturate down into the 60s and then 40s. He required BVM and was transferred to the ICU for further evaluation and management. Upon arrival in the ICU, she had ongoing desaturations and bradycardia before developing PEA arrest.  She had 9 minutes ACLS prior to ROSC including epi x 4 and bicarb x 2. During intubation attempt, she had copious food material and emesis in her oropharynx. Visualization of vocal cords was difficult due to amount of food material and emesis despite suctioning. On 2nd attempt, ETT was passed successfully and saturations improved shortly thereafter. There was grand mal seizure activity noted after intubation that  abated after Midazolam administration.  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[facility]); Non-suicidal depressed mood; Gender dysphoria; RLL pneumonia; AKI (acute kidney injury) (HCC); Uncontrolled type 2 diabetes mellitus with hyperglycemia, without long-term current use of insulin (HCC); Tobacco abuse; HIV infection (HCC); Positive RPR test; Hyperosmolar hyperglycemic state (HHS) (HCC); Seizure (HCC); Acute hypoxic respiratory failure (HCC); Acute encephalopathy; Hyperammonemia (HCC); Pancytopenia (HCC); Cryptococcosis (HCC); CAP (community acquired pneumonia); Globus sensation; Aspiration pneumonia of both lungs due to vomit (HCC); Cardiac arrest, cause unspecified (HCC); Cerebrovascular accident (CVA) (HCC); and Seizures (HCC) on their problem list.  Significant Hospital Events: Including procedures, antibiotic start and stop dates in addition to other pertinent events   3/16 admit 3/18 neuro consult, found to have acute CVA 3/19 Ongoing aspiration despite obvious dysphagia, was planning to leave AMA but sister talked pt into staying. then had a massive aspiration event leading to worse hypoxia and PEA arrest. Txf to [facility] 3/20 PSV cumulative total of 5-6 hrs, following some commands/ purposefully  Interim History / Subjective:  Following commands On PSV- weaning efforts yest limited by rapid breathing at times, suspected more anxiety.  Overnight, dyssynchronous with vent at times.  LTM neg for seizures thus far  Objective:  Blood pressure 97/68, pulse 99, temperature 100 F (37.8 C), resp. rate 18, height 6' (1.829 m), weight 107.3 kg, SpO2 99%.    Vent Mode: PRVC FiO2 (%):  [40 %-50 %] 40 % Set Rate:  [18 bmp] 18 bmp Vt Set:  [620 mL] 620 mL PEEP:  [5 cmH20] 5 cmH20 Pressure Support:  [  10 cmH20-12 cmH20] 12 cmH20 Plateau Pressure:  [21 cmH20-26 cmH20] 21 cmH20    Intake/Output Summary (Last 24 hours) at 10/01/2023 0745 Last data filed at 10/01/2023 0500 Gross per 24 hour  Intake 2231.25 ml  Output 880 ml  Net 1351.25 ml   Filed Weights   09/27/23 2256 09/29/23 1655 10/01/23 0500  Weight: 115.8 kg 116.4 kg 107.3 kg    Examination: Propofol 10 General:  critically ill adult intubated in NAD HEENT: MM pink/moist, ETT/ OGT, pupils 3/r, anicteric, mild furrowed brow, eeg in place Neuro: opens eyes to verbal and follows simple commands, weaker on R CV: rr, NSR/ ST, no murmur, 2+ pulses PULM:  PSV 10/5, coarse with mild thick tan secretions, strong cough GI: obese, soft, bs+, NT, foley- cyu Extremities: warm/dry, no tibial edema  Skin: no rashes  Tmax 100.8 UOP 837ml/ 24hrs +1.3L Net +3.5L  Labs reviewed> K 3.5, sCr stable 1.56, phos 2.1, Hgb 9.4  Labs/imaging personally reviewed:  CTA chest 3/16 > no PE, multifocal PNA. CT head 3/18 > neg. MRI brain 3/19 > acute right medullary infarct, cytotoxic edema with no hemorrhage or mass effect, small chronic right cerebellar infarcts. CTA head/neck 3/19 > no LVO, advanced for age atherosclerosis of L vertebral artery and prox basilar artery, possible mod L V4 stenosis.   Assessment & Plan:   PEA arrest Sz like activity  -reportedly purposeful movements 3/20  P -cEEG per neuro> no seizures thus far -keppra loaded 3/19, now on BID keppra 500mg   -neuro following -supportive care post arrest, avoid fevers  -cont to minimize secretions  Acute hypoxic respiratory failure Multifocal PNA, CAP  Aspiration event Dysphagia  Tobacco use  P - cont MV, daily SBT.  Switching to precedex to see if will help with anxiety/ weaning efforts.  PRN fentanyl.  Increase bowel regimen, no BM thus far - Secretions improved but still thick - VAP, pulm hygiene - cont rocephin, flagyl, bactrim  - place cortrak today given dysphagia, can cont meds/ nutrition over weekend pending SLP - prn BD - nicotine  patch   Acute R medullary infarct, cytotoxic edema Hx small R cerebellar infarcts, R PICA territory infarct  P - Neuro following, cont ASA, plavix, statin - secondary w/u per stroke service  - PT/ OT  Hypotension, suspect sedation related, resolved  - likely 2/2 sepsis, frank aspiration PNA P - not requiring pressors.  MAP > 65  - trend WBC/ fever curve.  Cultures ngtd - abx as above  HFpEF Hx HTN HLD P - cont to monitor hemodynamics, pending dex response, consider add back pta meds - statin, as above   AKI mild - UOP/ sCr stable - hemodynamic support - cont foley, strict I/Os, avoid nephrotoxins  Normocytic anemia - trend CBC  HIV / AIDS Hx +cryptococcal antigen  P - ID following - descovy per tube - fluconazole, bactrim ppx  - pending VL  DM2, with hyperglycemia P - SSIs prn,  lantus 20 u at bedtime  Hypokalemia Hypophosphatemia P -replete w/ kphos, trend on labs, replete prn  Hx provoked sz (hyperglycemia/ hypoxemia)  Seizure like activity following PEA arrest 3/19  P - cEEG > remains neg, defer to neuro - AEDs as above  Hyperthyroidism r/o  -TSH low T3 normal, T4 normal   THC use  - cessation counseling when able  DNR status  - see iPAL note 3/19   Best practice (evaluated daily):  Diet/type: NPO; TF  DVT prophylaxis: LMWH Pressure ulcer(s): pressure ulcer  assessment deferred  GI prophylaxis: PPI Lines: N/A Foley:  Yes, and it is still needed Code Status:  DNR Last date of multidisciplinary goals of care discussion: 3/19 IPAL.   Pending 3/21, no family at bedside  Labs   CBC: Recent Labs  Lab 09/26/23 2101 09/26/23 2214 09/27/23 0518 09/28/23 0500 09/29/23 0846 09/30/23 0436 09/30/23 0442 10/01/23 0528  WBC 6.9  --  6.6 6.0 6.5  --  5.5 4.4  NEUTROABS 5.7  --   --  4.5 4.9  --   --   --   HGB 11.7*   < > 11.4* 11.4* 12.0* 10.2* 10.4* 9.4*  HCT 34.6*   < > 34.6* 35.2* 36.5* 30.0* 30.6* 28.1*  MCV 87.4  --  89.2 91.0 91.5   --  87.2 87.8  PLT 194  --  193 221 236  --  195 159   < > = values in this interval not displayed.    Basic Metabolic Panel: Recent Labs  Lab 09/27/23 0518 09/28/23 0500 09/29/23 0846 09/30/23 0436 09/30/23 0442 10/01/23 0528  NA 133* 133* 133* 142 138 141  K 4.4 4.9 3.8 3.2* 3.1* 3.5  CL 100 98 97*  --  103 105  CO2 25 25 25   --  27 25  GLUCOSE 249* 174* 157*  --  115* 223*  BUN 19 32* 28*  --  27* 27*  CREATININE 1.27* 1.24 0.98  --  1.55* 1.56*  CALCIUM 8.8* 9.4 9.0  --  8.7* 8.6*  MG 1.9  --   --   --  2.1 2.1  PHOS 4.6  --   --   --  1.7* 2.1*   GFR: Estimated Creatinine Clearance (by C-G formula based on SCr of 1.56 mg/dL (H)) Male: 29.5 mL/min (A) Male: 74.1 mL/min (A) Recent Labs  Lab 09/26/23 2333 09/27/23 0102 09/27/23 0518 09/27/23 0532 09/28/23 0500 09/29/23 0846 09/29/23 1747 09/30/23 0442 10/01/23 0528  PROCALCITON  --   --  0.25  --   --   --   --   --   --   WBC  --   --  6.6  --  6.0 6.5  --  5.5 4.4  LATICACIDVEN 0.5 0.8  --  1.4  --   --  1.9  --   --     Liver Function Tests: Recent Labs  Lab 09/25/23 0058 09/26/23 2101 09/27/23 0518 09/28/23 0500 09/29/23 0846  AST 29 24 26  33 18  ALT 45* 45* 42 39 31  ALKPHOS 81 78 76 71 71  BILITOT 0.4 0.7 0.5 1.1 0.6  PROT 9.0* 9.3* 9.2* 9.6* 9.1*  ALBUMIN 3.2* 3.3* 3.1* 3.5 3.1*   No results for input(s): "LIPASE", "AMYLASE" in the last 168 hours. Recent Labs  Lab 09/29/23 1747  AMMONIA 30    ABG    Component Value Date/Time   PHART 7.561 (H) 09/30/2023 0436   PCO2ART 32.1 09/30/2023 0436   PO2ART 82 (L) 09/30/2023 0436   HCO3 28.8 (H) 09/30/2023 0436   TCO2 30 09/30/2023 0436   ACIDBASEDEF 8.1 (H) 08/25/2023 1920   O2SAT 98 09/30/2023 0436     Coagulation Profile: Recent Labs  Lab 09/26/23 2101  INR 1.0    Cardiac Enzymes: Recent Labs  Lab 09/27/23 0518  CKTOTAL 67    HbA1C: Hgb A1c MFr Bld  Date/Time Value Ref Range Status  09/29/2023 08:46 AM 10.8 (H) 4.8 -  5.6 % Final  Comment:    (NOTE) Pre diabetes:          5.7%-6.4%  Diabetes:              >6.4%  Glycemic control for   <7.0% adults with diabetes   08/26/2023 03:28 AM 11.7 (H) 4.8 - 5.6 % Final    Comment:    (NOTE) Pre diabetes:          5.7%-6.4%  Diabetes:              >6.4%  Glycemic control for   <7.0% adults with diabetes     CBG: Recent Labs  Lab 09/30/23 1508 09/30/23 1954 09/30/23 2346 10/01/23 0403 10/01/23 0725  GLUCAP 127* 126* 149* 167* 202*    CRITICAL CARE Performed by: Posey Boyer   Total critical care time: 32 minutes  Critical care time was exclusive of separately billable procedures and treating other patients. Critical care was necessary to treat or prevent imminent or life-threatening deterioration.  Critical care was time spent personally by me on the following activities: development of treatment plan with patient and/or surrogate as well as nursing, discussions with consultants, evaluation of patient's response to treatment, examination of patient, obtaining history from patient or surrogate, ordering and performing treatments and interventions, ordering and review of laboratory studies, ordering and review of radiographic studies, pulse oximetry and re-evaluation of patient's condition.     Posey Boyer, MSN, AG-ACNP-BC [facility] 10/01/2023, 7:45 AM  See Amion for pager If no response to pager , please call [phone] until 7pm After 7:00 pm call Elink  [phone]

## 2023-10-01 NOTE — Evaluation (Signed)
 Physical Therapy Evaluation Patient Details Name: Jeremiah Stevens MRN: [id number] DOB: [birth year] Today's Date: 10/01/2023  History of Present Illness  Pt initially admitted to [facility] on for difficulty swallowing. Rapid response on 3/19 with PEA, ROSC after 2 min, then bradycardic arrest 1 min later, CPR resumed, pt intubated, and ROSC 6 min later. Seizure-like activity also noted briefly after ROSC. Pt transferred to [facility] 3/20. 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 PMH includes: HIV/AIDS, HTN, herpes, migraine, depression, DM II, tobacco use, and seizure.   Clinical Impression  Pt seen for re-evaluation after x2 arrests and transfer to [facility]. Pt in bed, alert, and following all commands upon my arrival. Pt answering yes/no orientation questions appropriately, and was able to follow commands for LE exercises and long-sitting in bed with modA. Further OOB transfers deferred due to lack of assistance given pt's size and pt currently intubated. Mid session, pt asked for fan, PT left with both wrists restrained but mitts not re-applied to grab fan and upon my return pt had partially self-extubated. RN alerted and multiple arrived to manage respiratory status. Will continue to follow and progress mobility as tolerated. Given mobility today, pt will need continued skilled PT after d/c, recommendations and goals updated accordingly.       If plan is discharge home, recommend the following: A little help with walking and/or transfers;A little help with bathing/dressing/bathroom;Assistance with cooking/housework;Direct supervision/assist for medications management;Direct supervision/assist for financial management;Assist for transportation;Help with stairs or ramp for entrance;Supervision due to cognitive status   Can travel by private vehicle        Equipment Recommendations Other (comment) (defer until gait training can be completed)  Recommendations for Other Services   Rehab consult    Functional Status Assessment Patient has had a recent decline in their functional status and demonstrates the ability to make significant improvements in function in a reasonable and predictable amount of time.     Precautions / Restrictions Precautions Precautions: Fall;Other (comment) Recall of Precautions/Restrictions: Impaired Precaution/Restrictions Comments: ETT, bilateral wrist restraints and mitts Restrictions Weight Bearing Restrictions Per Provider Order: No      Mobility  Bed Mobility Overal bed mobility: Needs Assistance Bed Mobility: Supine to Sit     Supine to sit: Mod assist     General bed mobility comments: pull to sit in bed with modA, pt held long-sitting with minA support. limited mobility due to intubation and restraints    Transfers                   General transfer comment: deferred due to lack of assist, ETT, and body habitus    Modified Rankin (Stroke Patients Only) Modified Rankin (Stroke Patients Only) Pre-Morbid Rankin Score: Moderate disability Modified Rankin: Severe disability     Balance Overall balance assessment: Needs assistance Sitting-balance support: Bilateral upper extremity supported Sitting balance-Leahy Scale: Fair Sitting balance - Comments: able to pull to sit in bed, minA to trunk                                     Pertinent Vitals/Pain Pain Assessment Pain Assessment: No/denies pain    Home Living Family/patient expects to be discharged to:: Private residence Living Arrangements: Other relatives Available Help at Discharge: Family;Available PRN/intermittently Type of Home: House Home Access: Stairs to enter   Entrance Stairs-Number of Steps: 3   Home Layout: One level Home  Equipment: None Additional Comments: info from prior chart    Prior Function Prior Level of Function : Independent/Modified Independent                     Extremity/Trunk Assessment    Upper Extremity Assessment Upper Extremity Assessment: Defer to OT evaluation;RUE deficits/detail RUE Deficits / Details: grossly 4/5, limited shoulder flexion against gravity but good grip and push/pull    Lower Extremity Assessment Lower Extremity Assessment: RLE deficits/detail RLE Deficits / Details: minimal ROM against gravity, but able to hold DF, extend knee, and flex hip against min resistance. pt reports feels heavier    Cervical / Trunk Assessment Cervical / Trunk Assessment: Normal  Communication   Communication Communication: Impaired Factors Affecting Communication: Trach/intubated    Cognition Arousal: Alert Behavior During Therapy: Restless, Impulsive   PT - Cognitive impairments: Difficult to assess Difficult to assess due to: Intubated                     PT - Cognition Comments: pt following all commands, nodding appropriately and answering orientation questions with yes/no questions. pt did pull  ETT partially out during session despite restraints, poor insight to deficits/safety Following commands: Intact       Cueing Cueing Techniques: Verbal cues     General Comments General comments (skin integrity, edema, etc.): VSS, when PT left room to get fan, pt was able to reach ET tubing and partially self-extubate. RN alerted, multiple came to assist with managing respiratory status. (after session respiratory arrived, pt eventually re-intubated)    Exercises General Exercises - Lower Extremity Ankle Circles/Pumps: AROM, Both, 10 reps Quad Sets: AROM, Both, 10 reps Short Arc Quad: AROM, Both, 10 reps Hip ABduction/ADduction: AROM, Both, 10 reps Hip Flexion/Marching: AROM, Both, 5 reps   Assessment/Plan    PT Assessment Patient needs continued PT services  PT Problem List Decreased strength;Decreased activity tolerance;Decreased balance;Decreased mobility;Decreased safety awareness       PT Treatment Interventions DME instruction;Therapeutic  exercise;Gait training;Balance training;Functional mobility training;Therapeutic activities;Patient/family education    PT Goals (Current goals can be found in the Care Plan section)  Acute Rehab PT Goals Patient Stated Goal: to return home PT Goal Formulation: With patient Time For Goal Achievement: 10/15/23 Potential to Achieve Goals: Good    Frequency Min 3X/week        AM-PAC PT "6 Clicks" Mobility  Outcome Measure Help needed turning from your back to your side while in a flat bed without using bedrails?: A Lot Help needed moving from lying on your back to sitting on the side of a flat bed without using bedrails?: A Lot Help needed moving to and from a bed to a chair (including a wheelchair)?: Total Help needed standing up from a chair using your arms (e.g., wheelchair or bedside chair)?: Total Help needed to walk in hospital room?: Total Help needed climbing 3-5 steps with a railing? : Total 6 Click Score: 8    End of Session Equipment Utilized During Treatment: Oxygen Activity Tolerance: Patient tolerated treatment well Patient left: in bed;with call bell/phone within reach;with nursing/sitter in room;with restraints reapplied Nurse Communication: Mobility status PT Visit Diagnosis: Unsteadiness on feet (R26.81)    Time: 1610-9604 PT Time Calculation (min) (ACUTE ONLY): 28 min   Charges:   PT Evaluation $PT Re-evaluation: 1 Re-eval PT Treatments $Therapeutic Activity: 8-22 mins PT General Charges $$ ACUTE PT VISIT: 1 Visit         Vickki Muff,  PT, DPT   Acute Rehabilitation Department Office [phone] Secure Chat Communication Preferred  Jeremiah Stevens 10/01/2023, 3:21 PM

## 2023-10-01 NOTE — Procedures (Signed)
 Intubation Procedure Note  Jeremiah Stevens  [id number]  06/13/1976  Date:10/01/23  Time:2:11 PM   Provider Performing:Salinda Snedeker B Leaman Abe    Procedure: Intubation (31500)  Indication(s) Respiratory Failure  Consent Unable to obtain consent due to emergent nature of procedure.   Anesthesia Etomidate, Versed, Fentanyl, and Rocuronium   Time Out Verified patient identification, verified procedure, site/side was marked, verified correct patient position, special equipment/implants available, medications/allergies/relevant history reviewed, required imaging and test results available.   Sterile Technique Usual hand hygeine, masks, and gloves were used   Procedure Description Patient positioned in bed supine.  Sedation given as noted above.  Patient was intubated with endotracheal tube using Glidescope.  View was Grade 1 full glottis .  Number of attempts was 1.  Colorimetric CO2 detector was consistent with tracheal placement.   Complications/Tolerance None; patient tolerated the procedure well. Chest X-ray is ordered to verify placement.   EBL Minimal   Specimen(s) None

## 2023-10-01 NOTE — Progress Notes (Signed)
   10/01/23 1100  Spiritual Encounters  Type of Visit Follow up  Care provided to: Patient  Reason for visit Routine spiritual support  OnCall Visit No   Follow up visit with patient. Sat with patient and prayed while patient held hand. No family or visitors present at this time. Quoted scripture with patient. Patient squeezed chaplains hand as tear fell. Patient appeared to be comfortable and dozing off. Advised patient chaplain will visit with her again.

## 2023-10-01 NOTE — Progress Notes (Addendum)
 STROKE TEAM PROGRESS NOTE    SIGNIFICANT HOSPITAL EVENTS  3/16: Admitted for defeat dysphagia, found to have multifocal pneumonia likely due to aspiration. 3/18: Developed right facial droop and right-sided weakness.  MRI shows acute right medullary infarct  3/19: Developed hypoxia and respiratory distress leading to cardiac arrest with 9-minute downtime followed by seizure episode.  Difficult airway was noted due to emesis.  Transferred to ICU.  Cerebellar EEG negative LTM EEG placed overnight  INTERIM HISTORY/SUBJECTIVE  No family at the bedside. Patient remains intubated and is on CPAP.  On propofol at 10 mcg and Precedex has recently been started at 0.4 mcg.  Fentanyl IV as has been turned off Neurological exam patient's eyes are open, following commands and is tracking has nystagmus with leftward gaze, pupils are equal and reactive, blinks bilaterally to threat, bilateral uppers 4/5 bilateral grips are equal, bilateral lowers 4/5  OBJECTIVE  CBC    Component Value Date/Time   WBC 4.4 10/01/2023 0528   RBC 3.20 (L) 10/01/2023 0528   HGB 9.4 (L) 10/01/2023 0528   HCT 28.1 (L) 10/01/2023 0528   PLT 159 10/01/2023 0528   MCV 87.8 10/01/2023 0528   MCH 29.4 10/01/2023 0528   MCHC 33.5 10/01/2023 0528   RDW 15.0 10/01/2023 0528   LYMPHSABS 0.8 09/29/2023 0846   MONOABS 0.7 09/29/2023 0846   EOSABS 0.0 09/29/2023 0846   BASOSABS 0.0 09/29/2023 0846    BMET    Component Value Date/Time   NA 141 10/01/2023 0528   K 3.5 10/01/2023 0528   CL 105 10/01/2023 0528   CO2 25 10/01/2023 0528   GLUCOSE 223 (H) 10/01/2023 0528   BUN 27 (H) 10/01/2023 0528   CREATININE 1.56 (H) 10/01/2023 0528   CREATININE 1.03 05/22/2020 1128   CALCIUM 8.6 (L) 10/01/2023 0528   GFRNONAA 55 (L) 10/01/2023 0528   GFRNONAA 89 05/22/2020 1128    IMAGING past 24 hours Overnight EEG with video Result Date: 10/01/2023 Jeremiah Quest, MD     10/01/2023  6:51 AM Patient Name: Jeremiah Stevens MRN:  [id number] Epilepsy Attending: Charlsie Stevens Referring Physician/Provider: Caryl Pina, MD  Duration: 09/30/2023 2025 to 10/01/2023 0645 Patient history: 48yo M s/o cardiac arrest with seizure like activity. EEG to evaluate for seizure Level of alertness:  awake, asleep AEDs during EEG study: Propofol, LEV Technical aspects: This EEG study was done with scalp electrodes positioned according to the 10-20 International system of electrode placement. Electrical activity was reviewed with band pass filter of 1-70Hz , sensitivity of 7 uV/mm, display speed of 45mm/sec with a 60Hz  notched filter applied as appropriate. EEG data were recorded continuously and digitally stored.  Video monitoring was available and reviewed as appropriate. Description: The posterior dominant rhythm consists of 8 Hz activity of moderate voltage (25-35 uV) seen predominantly in posterior head regions, symmetric and reactive to eye opening and eye closing. Sleep was characterized by vertex waves, sleep spindles (12 to 14 Hz), maximal frontocentral region.  EEG showed continuous/intermittent generalized polymorphic sharply contoured 3 to 6 Hz theta-delta slowing. Hyperventilation and photic stimulation were not performed.   ABNORMALITY - Intermittent slow, generalized IMPRESSION: This study is suggestive of mild to moderate diffuse encephalopathy. No seizures or epileptiform discharges were seen throughout the recording. Jeremiah Stevens    Vitals:   10/01/23 1059 10/01/23 1100 10/01/23 1200 10/01/23 1247  BP:  121/78 123/88 115/73  Pulse:  91 94 (!) 102  Resp:  (!) 22 (!) 32 (!) 29  Temp:  (!) 96.8 F (36 C) 98.2 F (36.8 C) 99 F (37.2 C)  TempSrc:      SpO2: 95% 95% 96% 95%  Weight:      Height:         PHYSICAL EXAM General: Critically ill in no apparent distress Psych:  Mood and affect appropriate for situation CV: Regular rate and rhythm on monitor Respiratory:  Regular, unlabored respirations mechanical  ventilator GI: Abdomen soft and nontender   NEURO:  Intubated, sedated.  Eyes are open, following commands Cranial Nerves:  II: Pupils equal and reactive III, IV, VI: Midline, tracks bilaterally, left gaze with nystagmus, blinks bilaterally VII: UTA due to ETT VIII: hearing intact to voice. IX, X: Cough and gag present. ZO:XWRUEAVW shrug 5/5. XII: UTA Motor/sensory: BUE: 4/5, equal grips BLE: 4/5 Coordination: Deferred Gait- deferred  Most Recent NIH: 8   ASSESSMENT/PLAN  [name] is a 48 y.o. adult with history of HIV, DM, HTN, HLD, seizures in the setting of severe hyperglycemia, current smoker who presented to ED due to inability to swallow for 5 days, admitted for pnemonia. Neurology consulted 3/19 for dysphagia.  NIH on consult: 3.  Respiratory failure Cardiac arrest  Aspiration pneumonia  Developed hypoxia and respiratory distress leading to cardiac arrest with 9-minute downtime followed by seizure episode.  Difficult airway was noted due to emesis.  Intubated 3/19-> self-extubated 3/21-> re-intubated 3/21 Vent management per CCM Appreciate CCM assistance fentanyl and propofol off Precedex infusion on and off On rocephin and flagyl  Stroke:  right lateral medullary infarct, etiology: small or large vessel disease from uncontrolled stroke risk factors    CT head x 2 no acute finding CTA head & neck no LVO, advanced for age arthrosclerosis L vertebral V4 segment and proximal basilar artery MRI  Acute Right Medullary Infarct. Cytotoxic edema with no hemorrhage or mass effect. 2D Echo: LVEF 60-65%, Grade I diastolic dysfunction LDL 95 HgbA1c 10.8 UDS positive for THC VTE prophylaxis -Lovenox No antithrombotic prior to admission, now on aspirin 81 mg daily and clopidogrel 75 mg daily. Therapy recommendations:  Pending Disposition:  pending  Seizure  Post cardiac arrest seizure activity On keppra Now on LTM EEG LTM 3/21 This study is suggestive of  mild to moderate diffuse encephalopathy. No seizures or epileptiform discharges were seen throughout the recording.   Hx of Hypertension Now hypotensive Home meds:  lopressor Stable Levophed off since 3/20 Avoid hypotension, goal MAP >65 Long term BP goal normotensive  Hyperlipidemia Home meds:  none LDL 95, goal < 70 Add Lipitor 20mg  Continue statin on discharge  Diabetes type II Uncontrolled Home meds: Lantus insulin (previously noted noncompliance) HgbA1c 10.8, goal < 7.0 CBGs SSI Recommend close follow-up with PCP for better DM control  Tobacco Abuse Patient smokes 0.5 packs per day for 26 years      Ready to quit? N/A Nicotine replacement therapy provided  Substance Abuse UDS positive for  THC       Ready to quit? 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 These were not seen on 08/28/23 MRI  Other Active Problems Hx of HIV, Home  Meds: Biktarvy, bactrim and diflucan   Hospital day # 5  Pt seen by Neuro NP/APP and later by MD. Note/Stevens to be edited by MD as needed.     Jeremiah Mart DNP, ACNPC-AG  [facility]  ATTENDING NOTE: I reviewed above note and agree with the assessment and Stevens. Pt was seen and examined.   RN at the bedside. Pt this morning during round, much awake alert off sedation, eyes open, able to follow midline commands as well as peripheral commands with b/l hands and feet. Symmetrical strength, no sign of anoxic brain injury. Pt self extubated later but still not able to protect airway, was re-intubated, on precedex. Still on LTM, no seizure. Continue on DAPT and  statin, continue Abx and HRRAT therapy as per CCM. Will follow   For detailed assessment and Stevens, please refer to above/below as I have made changes wherever appropriate.   Jeremiah Plan, MD PhD Stroke Neurology 10/01/2023 8:31 PM  This patient is critically ill due to stroke, dysphagia, respiratory failure, cardiac arrest, seizure and at significant risk of neurological worsening, death form recurrent stroke anoxic brain injury, heart failure status epilepticus. This patient's care requires constant monitoring of vital signs, hemodynamics, respiratory and cardiac monitoring, review of multiple databases, neurological assessment, discussion with family, other specialists and medical decision making of high complexity. I spent 40 minutes of neurocritical care time in the care of this patient.

## 2023-10-02 ENCOUNTER — Inpatient Hospital Stay (HOSPITAL_COMMUNITY)

## 2023-10-02 DIAGNOSIS — I639 Cerebral infarction, unspecified: Secondary | ICD-10-CM | POA: Diagnosis not present

## 2023-10-02 DIAGNOSIS — I469 Cardiac arrest, cause unspecified: Secondary | ICD-10-CM | POA: Diagnosis not present

## 2023-10-02 DIAGNOSIS — R1310 Dysphagia, unspecified: Secondary | ICD-10-CM | POA: Diagnosis not present

## 2023-10-02 DIAGNOSIS — J189 Pneumonia, unspecified organism: Secondary | ICD-10-CM | POA: Diagnosis not present

## 2023-10-02 DIAGNOSIS — J9601 Acute respiratory failure with hypoxia: Secondary | ICD-10-CM | POA: Diagnosis not present

## 2023-10-02 DIAGNOSIS — D509 Iron deficiency anemia, unspecified: Secondary | ICD-10-CM

## 2023-10-02 DIAGNOSIS — R569 Unspecified convulsions: Secondary | ICD-10-CM | POA: Diagnosis not present

## 2023-10-02 LAB — CBC
HCT: 30.1 % — ABNORMAL LOW (ref 39.0–52.0)
Hemoglobin: 10.2 g/dL — ABNORMAL LOW (ref 13.0–17.0)
MCH: 29.8 pg (ref 26.0–34.0)
MCHC: 33.9 g/dL (ref 30.0–36.0)
MCV: 88 fL (ref 80.0–100.0)
Platelets: 181 10*3/uL (ref 150–400)
RBC: 3.42 MIL/uL — ABNORMAL LOW (ref 4.22–5.81)
RDW: 14.7 % (ref 11.5–15.5)
WBC: 5.7 10*3/uL (ref 4.0–10.5)
nRBC: 0 % (ref 0.0–0.2)

## 2023-10-02 LAB — GLUCOSE, CAPILLARY
Glucose-Capillary: 237 mg/dL — ABNORMAL HIGH (ref 70–99)
Glucose-Capillary: 241 mg/dL — ABNORMAL HIGH (ref 70–99)
Glucose-Capillary: 270 mg/dL — ABNORMAL HIGH (ref 70–99)
Glucose-Capillary: 375 mg/dL — ABNORMAL HIGH (ref 70–99)
Glucose-Capillary: 376 mg/dL — ABNORMAL HIGH (ref 70–99)
Glucose-Capillary: 377 mg/dL — ABNORMAL HIGH (ref 70–99)
Glucose-Capillary: 380 mg/dL — ABNORMAL HIGH (ref 70–99)
Glucose-Capillary: 427 mg/dL — ABNORMAL HIGH (ref 70–99)
Glucose-Capillary: 433 mg/dL — ABNORMAL HIGH (ref 70–99)

## 2023-10-02 LAB — PHOSPHORUS: Phosphorus: 3.9 mg/dL (ref 2.5–4.6)

## 2023-10-02 LAB — BASIC METABOLIC PANEL
Anion gap: 9 (ref 5–15)
BUN: 25 mg/dL — ABNORMAL HIGH (ref 6–20)
CO2: 25 mmol/L (ref 22–32)
Calcium: 8.8 mg/dL — ABNORMAL LOW (ref 8.9–10.3)
Chloride: 107 mmol/L (ref 98–111)
Creatinine, Ser: 1.36 mg/dL — ABNORMAL HIGH (ref 0.61–1.24)
GFR, Estimated: >60 mL/min (ref >60)
Glucose, Bld: 253 mg/dL — ABNORMAL HIGH (ref 70–99)
Potassium: 4.2 mmol/L (ref 3.5–5.1)
Sodium: 141 mmol/L (ref 135–145)

## 2023-10-02 LAB — CULTURE, RESPIRATORY W GRAM STAIN
Culture: NORMAL
Gram Stain: NONE SEEN

## 2023-10-02 LAB — HIV-1 RNA QUANT-NO REFLEX-BLD
HIV 1 RNA Quant: 3410 {copies}/mL
LOG10 HIV-1 RNA: 3.533 {Log_copies}/mL

## 2023-10-02 LAB — MAGNESIUM: Magnesium: 2.3 mg/dL (ref 1.7–2.4)

## 2023-10-02 MED ORDER — INSULIN GLARGINE 100 UNIT/ML ~~LOC~~ SOLN
30.0000 [IU] | Freq: Every day | SUBCUTANEOUS | Status: DC
  Filled 2023-10-02: qty 0.3

## 2023-10-02 MED ORDER — INSULIN GLARGINE 100 UNIT/ML ~~LOC~~ SOLN
24.0000 [IU] | Freq: Every day | SUBCUTANEOUS | Status: DC
  Filled 2023-10-02: qty 0.24

## 2023-10-02 MED ORDER — FUROSEMIDE 10 MG/ML IJ SOLN
20.0000 mg | Freq: Once | INTRAMUSCULAR | Status: AC
  Administered 2023-10-02: 20 mg via INTRAVENOUS
  Filled 2023-10-02: qty 2

## 2023-10-02 MED ORDER — BISACODYL 10 MG RE SUPP: 10.0000 mg | Freq: Every day | RECTAL | Status: DC | PRN

## 2023-10-02 MED ORDER — INSULIN ASPART 100 UNIT/ML IJ SOLN
0.0000 [IU] | INTRAMUSCULAR | Status: DC
  Administered 2023-10-02 (×2): 15 [IU] via SUBCUTANEOUS
  Administered 2023-10-02: 8 [IU] via SUBCUTANEOUS
  Administered 2023-10-02 – 2023-10-03 (×2): 15 [IU] via SUBCUTANEOUS

## 2023-10-02 MED ORDER — LACTULOSE 10 GM/15ML PO SOLN
30.0000 g | Freq: Once | ORAL | Status: AC
  Administered 2023-10-02: 30 g
  Filled 2023-10-02: qty 45

## 2023-10-02 MED ORDER — METRONIDAZOLE 500 MG PO TABS
500.0000 mg | ORAL_TABLET | Freq: Two times a day (BID) | ORAL | Status: DC
  Administered 2023-10-02: 500 mg
  Filled 2023-10-02 (×2): qty 1

## 2023-10-02 MED ORDER — SENNA 8.6 MG PO TABS
2.0000 | ORAL_TABLET | Freq: Every day | ORAL | Status: DC
  Administered 2023-10-02 – 2023-10-11 (×8): 17.2 mg
  Filled 2023-10-02 (×8): qty 2

## 2023-10-02 MED ORDER — INSULIN GLARGINE 100 UNIT/ML ~~LOC~~ SOLN
30.0000 [IU] | Freq: Every day | SUBCUTANEOUS | Status: DC
  Administered 2023-10-02: 30 [IU] via SUBCUTANEOUS
  Filled 2023-10-02 (×2): qty 0.3

## 2023-10-02 MED ORDER — BISACODYL 10 MG RE SUPP
10.0000 mg | Freq: Once | RECTAL | Status: AC
  Administered 2023-10-02: 10 mg via RECTAL
  Filled 2023-10-02: qty 1

## 2023-10-02 MED ORDER — INSULIN ASPART 100 UNIT/ML IJ SOLN
4.0000 [IU] | INTRAMUSCULAR | Status: DC
  Administered 2023-10-02 – 2023-10-03 (×5): 4 [IU] via SUBCUTANEOUS

## 2023-10-02 NOTE — Progress Notes (Signed)
 LTM maint complete - no skin breakdown seen Serviced T7 T8 [facility] monitored, Event button test confirmed by [facility].

## 2023-10-02 NOTE — Progress Notes (Addendum)
 "STROKE TEAM PROGRESS NOTE    SIGNIFICANT HOSPITAL EVENTS  3/16: Admitted for defeat dysphagia, found to have multifocal pneumonia likely due to aspiration. 3/18: Developed right facial droop and right-sided weakness.  MRI shows acute right medullary infarct  3/19: Developed hypoxia and respiratory distress leading to cardiac arrest with 9-minute downtime followed by seizure episode.  Difficult airway was noted due to emesis.  Transferred to ICU.  Cerebellar EEG negative LTM EEG placed overnight  INTERIM HISTORY/SUBJECTIVE  No family at the bedside.  Patient self extubated yesterday and had gotten reintubated after he developed respiratory distress.  He is on Precedex at 0.8 mcg.  Still on LTM generalized slowing no seizures or epileptiform discharges noted.  Will DC LTM today  Neurological exam remains the same with right gaze preference dysconjugate gaze, moves all extremities follows commands  OBJECTIVE  CBC    Component Value Date/Time   WBC 5.7 10/02/2023 0358   RBC 3.42 (L) 10/02/2023 0358   HGB 10.2 (L) 10/02/2023 0358   HCT 30.1 (L) 10/02/2023 0358   PLT 181 10/02/2023 0358   MCV 88.0 10/02/2023 0358   MCH 29.8 10/02/2023 0358   MCHC 33.9 10/02/2023 0358   RDW 14.7 10/02/2023 0358   LYMPHSABS 0.8 09/29/2023 0846   MONOABS 0.7 09/29/2023 0846   EOSABS 0.0 09/29/2023 0846   BASOSABS 0.0 09/29/2023 0846    BMET    Component Value Date/Time   NA 141 10/02/2023 0358   K 4.2 10/02/2023 0358   CL 107 10/02/2023 0358   CO2 25 10/02/2023 0358   GLUCOSE 253 (H) 10/02/2023 0358   BUN 25 (H) 10/02/2023 0358   CREATININE 1.36 (H) 10/02/2023 0358   CREATININE 1.03 05/22/2020 1128   CALCIUM 8.8 (L) 10/02/2023 0358   GFRNONAA >60 10/02/2023 0358   GFRNONAA 89 05/22/2020 1128    IMAGING past 24 hours DG Chest Port 1 View Result Date: 10/01/2023 CLINICAL DATA:  Intubated EXAM: PORTABLE CHEST 1 VIEW COMPARISON:  09/30/2023, 09/29/2023, 09/26/2023 FINDINGS: Endotracheal tube  tip is partially obscured by feeding tube. Tip appears to be about 3.7 cm superior to carina. Enteric tube tip below the diaphragm but incompletely assessed. Cardiomegaly with worsening bibasilar airspace disease. No pleural effusion or pneumothorax. IMPRESSION: 1. Endotracheal tube tip partially obscured by feeding tube. Tip appears to be about 3.7 cm superior to carina. 2. Cardiomegaly with worsening bibasilar airspace disease, this could be due to atelectasis, pneumonia or aspiration Electronically Signed   By: Jasmine Pang M.D.   On: 10/01/2023 16:20    Vitals:   10/02/23 0840 10/02/23 0900 10/02/23 1000 10/02/23 1100  BP:   (!) 159/99 (!) 171/105  Pulse: 69 77 76 71  Resp: 16 19 (!) 21 15  Temp: 98.4 F (36.9 C) 98.8 F (37.1 C) 98.2 F (36.8 C) 98.4 F (36.9 C)  TempSrc:      SpO2: 98% 97% 99% 100%  Weight:      Height:         PHYSICAL EXAM General: Critically ill in no apparent distress Psych:  Mood and affect appropriate for situation CV: Regular rate and rhythm on monitor Respiratory:  Regular, unlabored respirations mechanical ventilator GI: Abdomen soft and nontender   NEURO:  Intubated, sedated.  Eyes are closed open opens to voice, following commands Cranial Nerves:  II: Pupils equal and reactive III, IV, VI: Midline, tracks bilaterally, blinks bilaterally VII: UTA due to ETT VIII: hearing intact to voice. IX, X: Cough and gag present. ZO:XWRUEAVW  shrug 5/5. XII: UTA Motor/sensory: BUE: 4/5, equal grips BLE: 4/5 Coordination: Deferred Gait- deferred  Most Recent NIH: 8   ASSESSMENT/PLAN  Jeremiah Stevens is a 48 y.o. adult with history of HIV, DM, HTN, HLD, seizures in the setting of severe hyperglycemia, current smoker who presented to ED due to inability to swallow for 5 days, admitted for pnemonia. Neurology consulted 3/19 for dysphagia.  NIH on consult: 3.  Respiratory failure Cardiac arrest  Aspiration pneumonia  Developed hypoxia and  respiratory distress leading to cardiac arrest with 9-minute downtime followed by seizure episode.  Difficult airway was noted due to emesis.  Intubated 3/19-> self-extubated 3/21-> re-intubated 3/21 Vent management per CCM Appreciate CCM assistance fentanyl and propofol off Precedex infusion at 0.8 mcg On rocephin and flagyl  Stroke:  right lateral medullary infarct, etiology: small or large vessel disease from uncontrolled stroke risk factors    CT head x 2 no acute finding CTA head & neck no LVO, advanced for age arthrosclerosis L vertebral V4 segment and proximal basilar artery MRI  Acute Right Medullary Infarct. Cytotoxic edema with no hemorrhage or mass effect. 2D Echo: LVEF 60-65%, Grade I diastolic dysfunction LDL 95 HgbA1c 10.8 UDS positive for THC VTE prophylaxis -Lovenox No antithrombotic prior to admission, now on aspirin 81 mg daily and clopidogrel 75 mg daily.  For 3 months then aspirin alone Therapy recommendations:  Pending Disposition:  pending  Seizure  Post cardiac arrest seizure activity On keppra Now on LTM EEG LTM 3/21 This study is suggestive of mild to moderate diffuse encephalopathy. No seizures or epileptiform discharges were seen throughout the recording.  LTM 3/22 This study is suggestive of mild to moderate diffuse encephalopathy. No seizures or epileptiform discharges were seen throughout the recording. Will DC LTM today  Hx of Hypertension Now hypotensive Home meds:  lopressor Stable Levophed off since 3/20 Avoid hypotension, goal MAP >65 Long term BP goal normotensive  Hyperlipidemia Home meds:  none LDL 95, goal < 70 Add Lipitor 20mg  Continue statin on discharge  Diabetes type II Uncontrolled Home meds: Lantus insulin (previously noted noncompliance) HgbA1c 10.8, goal < 7.0 CBGs SSI Recommend close follow-up with PCP for better DM control  Tobacco Abuse Patient smokes 0.5 packs per day for 26 years      Ready to quit? N/A Nicotine  replacement therapy provided  Substance Abuse UDS positive for  THC       Ready to quit? 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 These were not seen on 08/28/23 MRI  Other Active Problems Hx of HIV, Home Meds: Biktarvy, bactrim and diflucan   Hospital day # 6  Pt seen by Neuro NP/APP and later by MD. Note/plan to be edited by MD as needed.     Gevena Mart DNP, ACNPC-AG  [facility]  I have personally obtained history,examined this patient, reviewed notes, independently viewed imaging studies, participated in medical decision making and plan of care.ROS completed by me personally and pertinent positives fully documented  I have made any additions or clarifications directly to the above note. Agree with note above.  Patient self extubated himself yesterday  but then developed respiratory distress and has been reintubated.  Continue ventilatory support for respiratory failure as per critical care team.  Continue aspirin and Plavix for 3 months followed by aspirin alone for his intracranial atherosclerosis and stroke.  No family at the bedside.  Discussed with Dr. Francine Graven critical care medicine. This patient is critically ill and at significant risk of neurological worsening, death and care requires constant monitoring of vital signs, hemodynamics,respiratory and cardiac monitoring, extensive review of multiple databases, frequent neurological assessment, discussion with family, other specialists and medical decision making of high complexity.I have made any additions or clarifications directly to the above note.This  critical care time does not reflect procedure time, or teaching time or supervisory time of PA/NP/Med Resident etc but could involve care discussion time.  I spent 30 minutes of neurocritical care time  in the care of  this patient.     Delia Heady, MD Medical Director [facility] Pager: [phone] 10/02/2023 12:15 PM

## 2023-10-02 NOTE — Plan of Care (Signed)
  Problem: Ischemic Stroke/TIA Tissue Perfusion: Goal: Complications of ischemic stroke/TIA will be minimized 10/02/2023 1813 by Chauncy Passy, RN Outcome: Progressing 10/02/2023 1644 by Chauncy Passy, RN Outcome: Progressing

## 2023-10-02 NOTE — Progress Notes (Signed)
 eLink Physician-Brief Progress Note Patient Name: Gladstone Rosas DOB: 12-08-1975 MRN: [id number]   Date of Service  10/02/2023  HPI/Events of Note  Ongoing agitation, self extubated, recent seizure and stroke  eICU Interventions  Renew restraints as needed for patient safety   [phone] -severely hyperglycemic, currently on 30 units of Lantus nightly.  CBG greater than 400 and only received 4 units of tube feed coverage.  Will add additional 10 units of long-acting insulin.  Changed to resistant scale for short acting coverage every 4 hours.  Did not change the current scheduled Lantus dosing-defer to primary team.  One-time aspart 10 units  Intervention Category Minor Interventions: Routine modifications to care plan (e.g. PRN medications for pain, fever);Agitation / anxiety - evaluation and management  Shelise Maron 10/02/2023, 9:20 PM

## 2023-10-02 NOTE — Progress Notes (Addendum)
 NAME:  Jeremiah Stevens, MRN:  [id number], DOB:  [birth year], LOS: 6 ADMISSION DATE:  09/26/2023, CONSULTATION DATE:  09/29/23 REFERRING MD:  Rennis Chris CHIEF COMPLAINT:  Hypoxia   History of Present Illness:  Pt is encephelopathic; therefore, this HPI is obtained from chart review. Jeremiah Stevens is a 48 y.o. adult who has a PMH as below including but not limited to DM2, seizures, HIV AIDS untreated, cryptococcal disease, obesity, polysubstance abuse, prior CVA. She was admitted 09/26/23 with dysphagia x 3 days along with headaches and drooling. He was found to be hypoxic to 80s on room air.  CTA was negative for PE but showed multifocal PNA. He was started on abx and BD's. Biktarvy was continued and ID was consulted.   3/18 He had right facial droop, right sided weakness, dysarthria. Neurology was consulted and recommended CTA and MRI. MRI demonstrated acute right medullary infarct.  Despite ongoing dysphagia (MBS showed moderately severe pharyngeal phase dysphagia and imaging showed unilateral bulging on right), pt insisted on eating. He reportedly ordered pizza overnight 3/18 and certainly aspirated. He had threatened to leave AMA despite stroke workup ongoing.  3/19, he had hypoxia for which rapid response was called. While being assess, he continued to desaturate down into the 60s and then 40s. He required BVM and was transferred to the ICU for further evaluation and management. Upon arrival in the ICU, she had ongoing desaturations and bradycardia before developing PEA arrest.  She had 9 minutes ACLS prior to ROSC including epi x 4 and bicarb x 2. During intubation attempt, she had copious food material and emesis in her oropharynx. Visualization of vocal cords was difficult due to amount of food material and emesis despite suctioning. On 2nd attempt, ETT was passed successfully and saturations improved shortly thereafter. There was grand mal seizure activity noted after intubation that  abated after Midazolam administration.  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[facility]); Non-suicidal depressed mood; Gender dysphoria; RLL pneumonia; AKI (acute kidney injury) (HCC); Uncontrolled type 2 diabetes mellitus with hyperglycemia, without long-term current use of insulin (HCC); Tobacco abuse; HIV infection (HCC); Positive RPR test; Hyperosmolar hyperglycemic state (HHS) (HCC); Seizure (HCC); Acute hypoxic respiratory failure (HCC); Acute encephalopathy; Hyperammonemia (HCC); Pancytopenia (HCC); Cryptococcosis (HCC); CAP (community acquired pneumonia); Globus sensation; Aspiration pneumonia of both lungs due to vomit (HCC); Cardiac arrest, cause unspecified (HCC); Cerebrovascular accident (CVA) (HCC); and Seizures (HCC) on their problem list.  Significant Hospital Events: Including procedures, antibiotic start and stop dates in addition to other pertinent events   3/16 admit 3/18 neuro consult, found to have acute CVA 3/19 Ongoing aspiration despite obvious dysphagia, was planning to leave AMA but sister talked pt into staying. then had a massive aspiration event leading to worse hypoxia and PEA arrest. Txf to [facility] 3/20 PSV cumulative total of 5-6 hrs, following some commands/ purposefully 3/21 self extubated, was reintubated  Interim History / Subjective:   No acute events overnight Blood sugars are elevated, started steroids yesterday  Objective:  Blood pressure (!) 153/103, pulse 68, temperature (!) 97.2 F (36.2 C), resp. rate 16, height 6' (1.829 m), weight 107.3 kg, SpO2 96%.    Vent Mode: PRVC FiO2 (%):  [40 %-100 %] 40 % Set Rate:  [16 bmp-18 bmp] 16 bmp Vt Set:  [620 mL] 620 mL PEEP:  [5 cmH20] 5 cmH20 Pressure Support:  [10 cmH20-14 cmH20] 10 cmH20 Plateau Pressure:  [19 cmH20-27  cmH20] 23 cmH20   Intake/Output Summary (Last 24 hours) at 10/02/2023  0758 Last data filed at 10/02/2023 1610 Gross per 24 hour  Intake 1961.03 ml  Output 1100 ml  Net 861.03 ml   Filed Weights   09/27/23 2256 09/29/23 1655 10/01/23 0500  Weight: 115.8 kg 116.4 kg 107.3 kg    Examination: General:  critically ill adult intubated in NAD HEENT: MM pink/moist, ETT/ OGT, eeg in place Neuro: opens eyes to verbal and follows simple commands, weaker on R, PERRL CV: rr, NSR/ ST, no murmur, 2+ pulses PULM:  inspiratory chirps on right GI: obese, soft, bs+, NT, foley- cyu Extremities: warm/dry, no tibial edema  Skin: no rashes    Labs/imaging personally reviewed:  CTA chest 3/16 > no PE, multifocal PNA. CT head 3/18 > neg. MRI brain 3/19 > acute right medullary infarct, cytotoxic edema with no hemorrhage or mass effect, small chronic right cerebellar infarcts. CTA head/neck 3/19 > no LVO, advanced for age atherosclerosis of L vertebral artery and prox basilar artery, possible mod L V4 stenosis.   Assessment & Plan:   PEA arrest Sz like activity  -reportedly purposeful movements 3/20  P -cEEG per neuro> no seizures thus far -keppra loaded 3/19, now on BID keppra 500mg   -neuro following -supportive care post arrest, avoid fevers  -cont to minimize secretions  Acute hypoxic respiratory failure Multifocal PNA, CAP  Aspiration event Dysphagia  Tobacco use  P - cont MV, daily SBT - increase peep to 8 and reduce Vt to 7cc/kg to - precedex and PRN fentanyl for sedation - VAP, pulm hygiene - cont rocephin, flagyl, bactrim  - prn nebs - nicotine patch  - 40mg  solumedrol started 3/21 for upper airway edema post self-extubation - give 20mg  IV lasix this AM  Acute R medullary infarct, cytotoxic edema Hx small R cerebellar infarcts, R PICA territory infarct  Encephalopathy, multifactorial P - Neuro following, cont ASA, plavix, statin - secondary w/u per stroke service  - PT/ OT  HFpEF Hx HTN HLD P - cont to monitor hemodynamics,  pending dex response, consider add back pta meds - statin, as above  - lasix 20mg  IV this AM  AKI mild - UOP/ sCr stable - hemodynamic support - cont foley, strict I/Os, avoid nephrotoxins  Normocytic anemia - trend CBC  HIV / AIDS Hx +cryptococcal antigen  P - ID following - descovy per tube - fluconazole, bactrim ppx  - pending VL  DM2, with hyperglycemia P - SSIs prn,  lantus 24 units at bedtime  Anemia, normocytic, mild - monitor  Hypokalemia Hypophosphatemia P -replete w/ kphos, trend on labs, replete prn  Hx provoked sz (hyperglycemia/ hypoxemia)  Seizure like activity following PEA arrest 3/19  P - cEEG > remains neg, defer to neuro - AEDs as above  Hyperthyroidism r/o  -TSH low T3 normal, T4 normal   THC use  - cessation counseling when able  DNR status  - see iPAL note 3/19   Best practice (evaluated daily):  Diet/type: NPO; TF  DVT prophylaxis: LMWH Pressure ulcer(s): pressure ulcer assessment deferred  GI prophylaxis: PPI Lines: N/A Foley:  Yes, and it is still needed Code Status:  DNR Last date of multidisciplinary goals of care discussion: 3/19 IPAL.   Labs   CBC: Recent Labs  Lab 09/26/23 2101 09/26/23 2214 09/28/23 0500 09/29/23 0846 09/30/23 0436 09/30/23 0442 10/01/23 0528 10/01/23 1706 10/02/23 0358  WBC 6.9   < > 6.0 6.5  --  5.5  4.4  --  5.7  NEUTROABS 5.7  --  4.5 4.9  --   --   --   --   --   HGB 11.7*   < > 11.4* 12.0* 10.2* 10.4* 9.4* 10.2* 10.2*  HCT 34.6*   < > 35.2* 36.5* 30.0* 30.6* 28.1* 30.0* 30.1*  MCV 87.4   < > 91.0 91.5  --  87.2 87.8  --  88.0  PLT 194   < > 221 236  --  195 159  --  181   < > = values in this interval not displayed.    Basic Metabolic Panel: Recent Labs  Lab 09/27/23 0518 09/28/23 0500 09/29/23 0846 09/30/23 0436 09/30/23 0442 10/01/23 0528 10/01/23 1706 10/02/23 0358  NA 133* 133* 133* 142 138 141 142 141  K 4.4 4.9 3.8 3.2* 3.1* 3.5 4.5 4.2  CL 100 98 97*  --  103 105   --  107  CO2 25 25 25   --  27 25  --  25  GLUCOSE 249* 174* 157*  --  115* 223*  --  253*  BUN 19 32* 28*  --  27* 27*  --  25*  CREATININE 1.27* 1.24 0.98  --  1.55* 1.56*  --  1.36*  CALCIUM 8.8* 9.4 9.0  --  8.7* 8.6*  --  8.8*  MG 1.9  --   --   --  2.1 2.1  --  2.3  PHOS 4.6  --   --   --  1.7* 2.1*  --  3.9   GFR: Estimated Creatinine Clearance (by C-G formula based on SCr of 1.36 mg/dL (H)) Male: 16.1 mL/min (A) Male: 85 mL/min (A) Recent Labs  Lab 09/26/23 2333 09/27/23 0102 09/27/23 0518 09/27/23 0532 09/28/23 0500 09/29/23 0846 09/29/23 1747 09/30/23 0442 10/01/23 0528 10/02/23 0358  PROCALCITON  --   --  0.25  --   --   --   --   --   --   --   WBC  --   --  6.6  --    < > 6.5  --  5.5 4.4 5.7  LATICACIDVEN 0.5 0.8  --  1.4  --   --  1.9  --   --   --    < > = values in this interval not displayed.    Liver Function Tests: Recent Labs  Lab 09/26/23 2101 09/27/23 0518 09/28/23 0500 09/29/23 0846  AST 24 26 33 18  ALT 45* 42 39 31  ALKPHOS 78 76 71 71  BILITOT 0.7 0.5 1.1 0.6  PROT 9.3* 9.2* 9.6* 9.1*  ALBUMIN 3.3* 3.1* 3.5 3.1*   No results for input(s): "LIPASE", "AMYLASE" in the last 168 hours. Recent Labs  Lab 09/29/23 1747  AMMONIA 30    ABG    Component Value Date/Time   PHART 7.500 (H) 10/01/2023 1706   PCO2ART 35.7 10/01/2023 1706   PO2ART 239 (H) 10/01/2023 1706   HCO3 27.8 10/01/2023 1706   TCO2 29 10/01/2023 1706   ACIDBASEDEF 8.1 (H) 08/25/2023 1920   O2SAT 100 10/01/2023 1706     Coagulation Profile: Recent Labs  Lab 09/26/23 2101  INR 1.0    Cardiac Enzymes: Recent Labs  Lab 09/27/23 0518  CKTOTAL 67    HbA1C: Hgb A1c MFr Bld  Date/Time Value Ref Range Status  09/29/2023 08:46 AM 10.8 (H) 4.8 - 5.6 % Final    Comment:    (NOTE) Pre  diabetes:          5.7%-6.4%  Diabetes:              >6.4%  Glycemic control for   <7.0% adults with diabetes   08/26/2023 03:28 AM 11.7 (H) 4.8 - 5.6 % Final    Comment:     (NOTE) Pre diabetes:          5.7%-6.4%  Diabetes:              >6.4%  Glycemic control for   <7.0% adults with diabetes     CBG: Recent Labs  Lab 10/01/23 1527 10/01/23 2010 10/02/23 0000 10/02/23 0343 10/02/23 0752  GLUCAP 133* 238* 237* 241* 270*    CRITICAL CARE Performed by: Martina Sinner   Total critical care time: 40 minutes  Critical care time was exclusive of separately billable procedures and treating other patients. Critical care was necessary to treat or prevent imminent or life-threatening deterioration.  Critical care was time spent personally by me on the following activities: development of treatment plan with patient and/or surrogate as well as nursing, discussions with consultants, evaluation of patient's response to treatment, examination of patient, obtaining history from patient or surrogate, ordering and performing treatments and interventions, ordering and review of laboratory studies, ordering and review of radiographic studies, pulse oximetry and re-evaluation of patient's condition.    Melody Comas, MD [facility] Office: [phone]   See Amion for personal pager PCCM on call pager [phone] until 7pm. Please call Elink 7p-7a. [phone]

## 2023-10-02 NOTE — Procedures (Signed)
 Patient Name: Jeremiah Stevens  MRN: [id number]  Epilepsy Attending: Charlsie Quest  Referring Physician/Provider: Caryl Pina, MD   Duration: 10/01/2023 4098 to 10/02/2023 1191   Patient history: 48yo M s/o cardiac arrest with seizure like activity. EEG to evaluate for seizure    Level of alertness:  awake, asleep   AEDs during EEG study: LEV   Technical aspects: This EEG study was done with scalp electrodes positioned according to the 10-20 International system of electrode placement. Electrical activity was reviewed with band pass filter of 1-70Hz , sensitivity of 7 uV/mm, display speed of 43mm/sec with a 60Hz  notched filter applied as appropriate. EEG data were recorded continuously and digitally stored.  Video monitoring was available and reviewed as appropriate.   Description: The posterior dominant rhythm consists of 8 Hz activity of moderate voltage (25-35 uV) seen predominantly in posterior head regions, symmetric and reactive to eye opening and eye closing. Sleep was characterized by vertex waves, sleep spindles (12 to 14 Hz), maximal frontocentral region.  EEG showed continuous/intermittent generalized polymorphic sharply contoured 3 to 6 Hz theta-delta slowing. Hyperventilation and photic stimulation were not performed.      ABNORMALITY - Intermittent slow, generalized   IMPRESSION: This study is suggestive of mild to moderate diffuse encephalopathy. No seizures or epileptiform discharges were seen throughout the recording.   Jaylynn Siefert Annabelle Harman

## 2023-10-02 NOTE — Progress Notes (Signed)
 TF paused x1hr d/t blood sugars high. See MARs for meds

## 2023-10-02 NOTE — Progress Notes (Signed)
 LTM EEG disconnected - no skin breakdown at [facility].

## 2023-10-02 NOTE — Plan of Care (Signed)

## 2023-10-02 NOTE — Progress Notes (Signed)
 Inpatient Rehab Admissions Coordinator Note:   Per therapy patient was screened for CIR candidacy by Natoya Viscomi Luvenia Starch, CCC-SLP. Pt's insurance is out-of-network with CIR. Other rehab venues should be pursued. TOC made aware.    Wolfgang Phoenix, MS, CCC-SLP Admissions Coordinator [phone] 10/02/23 5:31 PM

## 2023-10-03 ENCOUNTER — Inpatient Hospital Stay (HOSPITAL_COMMUNITY)

## 2023-10-03 ENCOUNTER — Other Ambulatory Visit: Payer: Self-pay | Admitting: Infectious Disease

## 2023-10-03 DIAGNOSIS — J189 Pneumonia, unspecified organism: Secondary | ICD-10-CM | POA: Diagnosis not present

## 2023-10-03 DIAGNOSIS — B459 Cryptococcosis, unspecified: Secondary | ICD-10-CM

## 2023-10-03 DIAGNOSIS — J9691 Respiratory failure, unspecified with hypoxia: Secondary | ICD-10-CM | POA: Diagnosis not present

## 2023-10-03 DIAGNOSIS — B20 Human immunodeficiency virus [HIV] disease: Secondary | ICD-10-CM | POA: Diagnosis not present

## 2023-10-03 DIAGNOSIS — J9601 Acute respiratory failure with hypoxia: Secondary | ICD-10-CM | POA: Diagnosis not present

## 2023-10-03 DIAGNOSIS — I639 Cerebral infarction, unspecified: Secondary | ICD-10-CM | POA: Diagnosis not present

## 2023-10-03 DIAGNOSIS — R1310 Dysphagia, unspecified: Secondary | ICD-10-CM | POA: Diagnosis not present

## 2023-10-03 DIAGNOSIS — I469 Cardiac arrest, cause unspecified: Secondary | ICD-10-CM | POA: Diagnosis not present

## 2023-10-03 LAB — BASIC METABOLIC PANEL
Anion gap: 8 (ref 5–15)
BUN: 43 mg/dL — ABNORMAL HIGH (ref 6–20)
CO2: 24 mmol/L (ref 22–32)
Calcium: 8.8 mg/dL — ABNORMAL LOW (ref 8.9–10.3)
Chloride: 109 mmol/L (ref 98–111)
Creatinine, Ser: 1.66 mg/dL — ABNORMAL HIGH (ref 0.61–1.24)
GFR, Estimated: 51 mL/min — ABNORMAL LOW (ref >60)
Glucose, Bld: 430 mg/dL — ABNORMAL HIGH (ref 70–99)
Potassium: 5.1 mmol/L (ref 3.5–5.1)
Sodium: 141 mmol/L (ref 135–145)

## 2023-10-03 LAB — GLUCOSE, CAPILLARY
Glucose-Capillary: 172 mg/dL — ABNORMAL HIGH (ref 70–99)
Glucose-Capillary: 177 mg/dL — ABNORMAL HIGH (ref 70–99)
Glucose-Capillary: 191 mg/dL — ABNORMAL HIGH (ref 70–99)
Glucose-Capillary: 274 mg/dL — ABNORMAL HIGH (ref 70–99)
Glucose-Capillary: 305 mg/dL — ABNORMAL HIGH (ref 70–99)
Glucose-Capillary: 318 mg/dL — ABNORMAL HIGH (ref 70–99)
Glucose-Capillary: 402 mg/dL — ABNORMAL HIGH (ref 70–99)

## 2023-10-03 LAB — CBC
HCT: 31.9 % — ABNORMAL LOW (ref 39.0–52.0)
Hemoglobin: 10.1 g/dL — ABNORMAL LOW (ref 13.0–17.0)
MCH: 28.9 pg (ref 26.0–34.0)
MCHC: 31.7 g/dL (ref 30.0–36.0)
MCV: 91.1 fL (ref 80.0–100.0)
Platelets: 213 10*3/uL (ref 150–400)
RBC: 3.5 MIL/uL — ABNORMAL LOW (ref 4.22–5.81)
RDW: 15.4 % (ref 11.5–15.5)
WBC: 4.6 10*3/uL (ref 4.0–10.5)
nRBC: 0 % (ref 0.0–0.2)

## 2023-10-03 LAB — MAGNESIUM: Magnesium: 2.8 mg/dL — ABNORMAL HIGH (ref 1.7–2.4)

## 2023-10-03 LAB — PHOSPHORUS: Phosphorus: 3.8 mg/dL (ref 2.5–4.6)

## 2023-10-03 MED ORDER — METHYLPREDNISOLONE SODIUM SUCC 40 MG IJ SOLR
40.0000 mg | Freq: Two times a day (BID) | INTRAMUSCULAR | Status: DC
  Administered 2023-10-03 – 2023-10-06 (×7): 40 mg via INTRAVENOUS
  Filled 2023-10-03 (×8): qty 1

## 2023-10-03 MED ORDER — DILTIAZEM HCL 60 MG PO TABS
60.0000 mg | ORAL_TABLET | Freq: Four times a day (QID) | ORAL | Status: DC
  Administered 2023-10-03 – 2023-10-05 (×6): 60 mg via ORAL
  Filled 2023-10-03 (×6): qty 1

## 2023-10-03 MED ORDER — INSULIN ASPART 100 UNIT/ML IJ SOLN
8.0000 [IU] | INTRAMUSCULAR | Status: DC
  Administered 2023-10-03 – 2023-10-04 (×6): 8 [IU] via SUBCUTANEOUS

## 2023-10-03 MED ORDER — SODIUM CHLORIDE 0.9% IV SOLUTION: Freq: Once | INTRAVENOUS | Status: DC

## 2023-10-03 MED ORDER — INSULIN ASPART 100 UNIT/ML IJ SOLN
20.0000 [IU] | Freq: Once | INTRAMUSCULAR | Status: AC
  Administered 2023-10-03: 20 [IU] via SUBCUTANEOUS

## 2023-10-03 MED ORDER — PRIMAQUINE PHOSPHATE 26.3 (15 BASE) MG PO TABS
30.0000 mg | ORAL_TABLET | Freq: Every day | ORAL | Status: DC
  Administered 2023-10-03 – 2023-10-09 (×7): 30 mg via NASOGASTRIC
  Filled 2023-10-03 (×7): qty 2

## 2023-10-03 MED ORDER — CLINDAMYCIN PHOSPHATE 900 MG/50ML IV SOLN
900.0000 mg | Freq: Three times a day (TID) | INTRAVENOUS | Status: DC
  Administered 2023-10-03 – 2023-10-09 (×18): 900 mg via INTRAVENOUS
  Filled 2023-10-03 (×20): qty 50

## 2023-10-03 MED ORDER — INSULIN ASPART 100 UNIT/ML IJ SOLN
0.0000 [IU] | INTRAMUSCULAR | Status: DC
  Administered 2023-10-03: 4 [IU] via SUBCUTANEOUS
  Administered 2023-10-03: 15 [IU] via SUBCUTANEOUS
  Administered 2023-10-03: 4 [IU] via SUBCUTANEOUS
  Administered 2023-10-03: 11 [IU] via SUBCUTANEOUS
  Administered 2023-10-03 – 2023-10-04 (×2): 15 [IU] via SUBCUTANEOUS
  Administered 2023-10-04 (×2): 11 [IU] via SUBCUTANEOUS
  Administered 2023-10-04: 7 [IU] via SUBCUTANEOUS
  Administered 2023-10-04: 15 [IU] via SUBCUTANEOUS
  Administered 2023-10-04: 11 [IU] via SUBCUTANEOUS
  Administered 2023-10-05: 15 [IU] via SUBCUTANEOUS
  Administered 2023-10-05 (×2): 4 [IU] via SUBCUTANEOUS
  Administered 2023-10-05: 7 [IU] via SUBCUTANEOUS
  Administered 2023-10-05 (×2): 11 [IU] via SUBCUTANEOUS
  Administered 2023-10-06: 3 [IU] via SUBCUTANEOUS
  Administered 2023-10-06: 7 [IU] via SUBCUTANEOUS
  Administered 2023-10-06: 11 [IU] via SUBCUTANEOUS
  Administered 2023-10-06: 15 [IU] via SUBCUTANEOUS
  Administered 2023-10-06 – 2023-10-07 (×2): 4 [IU] via SUBCUTANEOUS
  Administered 2023-10-07: 7 [IU] via SUBCUTANEOUS
  Administered 2023-10-07 – 2023-10-08 (×2): 4 [IU] via SUBCUTANEOUS
  Administered 2023-10-08: 7 [IU] via SUBCUTANEOUS
  Administered 2023-10-08: 11 [IU] via SUBCUTANEOUS
  Administered 2023-10-08 (×3): 7 [IU] via SUBCUTANEOUS
  Administered 2023-10-09: 4 [IU] via SUBCUTANEOUS
  Administered 2023-10-09: 7 [IU] via SUBCUTANEOUS
  Administered 2023-10-09: 4 [IU] via SUBCUTANEOUS
  Administered 2023-10-09 (×2): 7 [IU] via SUBCUTANEOUS
  Administered 2023-10-09 – 2023-10-10 (×2): 4 [IU] via SUBCUTANEOUS
  Administered 2023-10-10: 7 [IU] via SUBCUTANEOUS
  Administered 2023-10-10: 4 [IU] via SUBCUTANEOUS
  Administered 2023-10-10 (×2): 11 [IU] via SUBCUTANEOUS
  Administered 2023-10-10 – 2023-10-11 (×2): 7 [IU] via SUBCUTANEOUS
  Administered 2023-10-11 (×2): 4 [IU] via SUBCUTANEOUS
  Administered 2023-10-11: 7 [IU] via SUBCUTANEOUS
  Administered 2023-10-11: 11 [IU] via SUBCUTANEOUS
  Administered 2023-10-12 (×3): 7 [IU] via SUBCUTANEOUS
  Administered 2023-10-12: 4 [IU] via SUBCUTANEOUS
  Administered 2023-10-12: 7 [IU] via SUBCUTANEOUS
  Administered 2023-10-13: 3 [IU] via SUBCUTANEOUS
  Administered 2023-10-13: 7 [IU] via SUBCUTANEOUS
  Administered 2023-10-13: 4 [IU] via SUBCUTANEOUS
  Administered 2023-10-13: 7 [IU] via SUBCUTANEOUS
  Administered 2023-10-13: 4 [IU] via SUBCUTANEOUS
  Administered 2023-10-14: 3 [IU] via SUBCUTANEOUS
  Administered 2023-10-14 (×2): 4 [IU] via SUBCUTANEOUS
  Administered 2023-10-14: 7 [IU] via SUBCUTANEOUS
  Administered 2023-10-15: 3 [IU] via SUBCUTANEOUS
  Administered 2023-10-15: 4 [IU] via SUBCUTANEOUS
  Administered 2023-10-15: 3 [IU] via SUBCUTANEOUS
  Administered 2023-10-15 (×2): 4 [IU] via SUBCUTANEOUS
  Administered 2023-10-15: 3 [IU] via SUBCUTANEOUS
  Administered 2023-10-16 – 2023-10-17 (×5): 4 [IU] via SUBCUTANEOUS
  Administered 2023-10-17 – 2023-10-18 (×4): 3 [IU] via SUBCUTANEOUS
  Administered 2023-10-19: 4 [IU] via SUBCUTANEOUS
  Administered 2023-10-20: 3 [IU] via SUBCUTANEOUS
  Administered 2023-10-20 – 2023-10-21 (×4): 4 [IU] via SUBCUTANEOUS
  Administered 2023-10-22 (×2): 3 [IU] via SUBCUTANEOUS
  Administered 2023-10-22 – 2023-10-23 (×4): 4 [IU] via SUBCUTANEOUS
  Administered 2023-10-23 – 2023-10-24 (×2): 3 [IU] via SUBCUTANEOUS
  Administered 2023-10-24: 4 [IU] via SUBCUTANEOUS
  Administered 2023-10-25 – 2023-10-26 (×3): 3 [IU] via SUBCUTANEOUS
  Administered 2023-10-26 – 2023-10-27 (×2): 4 [IU] via SUBCUTANEOUS
  Administered 2023-10-27 (×2): 3 [IU] via SUBCUTANEOUS
  Administered 2023-10-27 – 2023-10-28 (×3): 4 [IU] via SUBCUTANEOUS
  Administered 2023-10-29 – 2023-10-30 (×2): 3 [IU] via SUBCUTANEOUS
  Administered 2023-10-31: 4 [IU] via SUBCUTANEOUS
  Administered 2023-10-31 – 2023-11-02 (×8): 3 [IU] via SUBCUTANEOUS
  Administered 2023-11-02: 4 [IU] via SUBCUTANEOUS
  Administered 2023-11-03 (×2): 3 [IU] via SUBCUTANEOUS
  Administered 2023-11-03: 4 [IU] via SUBCUTANEOUS
  Administered 2023-11-03: 3 [IU] via SUBCUTANEOUS
  Administered 2023-11-04 (×2): 4 [IU] via SUBCUTANEOUS
  Administered 2023-11-04 – 2023-11-05 (×6): 3 [IU] via SUBCUTANEOUS
  Administered 2023-11-05: 4 [IU] via SUBCUTANEOUS
  Administered 2023-11-06: 3 [IU] via SUBCUTANEOUS
  Administered 2023-11-06: 4 [IU] via SUBCUTANEOUS
  Administered 2023-11-06: 3 [IU] via SUBCUTANEOUS
  Administered 2023-11-06: 4 [IU] via SUBCUTANEOUS
  Administered 2023-11-07 – 2023-11-08 (×4): 3 [IU] via SUBCUTANEOUS
  Administered 2023-11-08 (×3): 4 [IU] via SUBCUTANEOUS
  Administered 2023-11-08 – 2023-11-10 (×8): 3 [IU] via SUBCUTANEOUS
  Administered 2023-11-10: 4 [IU] via SUBCUTANEOUS
  Administered 2023-11-10: 3 [IU] via SUBCUTANEOUS

## 2023-10-03 MED ORDER — INSULIN GLARGINE 100 UNIT/ML ~~LOC~~ SOLN
30.0000 [IU] | Freq: Two times a day (BID) | SUBCUTANEOUS | Status: DC
  Administered 2023-10-03 (×2): 30 [IU] via SUBCUTANEOUS
  Filled 2023-10-03 (×4): qty 0.3

## 2023-10-03 MED ORDER — INSULIN GLARGINE 100 UNIT/ML ~~LOC~~ SOLN
10.0000 [IU] | Freq: Once | SUBCUTANEOUS | Status: AC
  Administered 2023-10-03: 10 [IU] via SUBCUTANEOUS
  Filled 2023-10-03 (×2): qty 0.1

## 2023-10-03 MED ORDER — INSULIN ASPART 100 UNIT/ML IJ SOLN: 0.0000 [IU] | INTRAMUSCULAR | Status: DC

## 2023-10-03 NOTE — Progress Notes (Signed)
 NAME:  Jeremiah Stevens, MRN:  [id number], DOB:  1975/09/03, LOS: 7 ADMISSION DATE:  09/26/2023, CONSULTATION DATE:  09/29/23 REFERRING MD:  Rennis Chris CHIEF COMPLAINT:  Hypoxia   History of Present Illness:  Pt is encephelopathic; therefore, this HPI is obtained from chart review. Jeremiah Stevens is a 48 y.o. adult who has a PMH as below including but not limited to DM2, seizures, HIV AIDS untreated, cryptococcal disease, obesity, polysubstance abuse, prior CVA. She was admitted 09/26/23 with dysphagia x 3 days along with headaches and drooling. He was found to be hypoxic to 80s on room air.  CTA was negative for PE but showed multifocal PNA. He was started on abx and BD's. Biktarvy was continued and ID was consulted.   3/18 He had right facial droop, right sided weakness, dysarthria. Neurology was consulted and recommended CTA and MRI. MRI demonstrated acute right medullary infarct.  Despite ongoing dysphagia (MBS showed moderately severe pharyngeal phase dysphagia and imaging showed unilateral bulging on right), pt insisted on eating. He reportedly ordered pizza overnight 3/18 and certainly aspirated. He had threatened to leave AMA despite stroke workup ongoing.  3/19, he had hypoxia for which rapid response was called. While being assess, he continued to desaturate down into the 60s and then 40s. He required BVM and was transferred to the ICU for further evaluation and management. Upon arrival in the ICU, she had ongoing desaturations and bradycardia before developing PEA arrest.  She had 9 minutes ACLS prior to ROSC including epi x 4 and bicarb x 2. During intubation attempt, she had copious food material and emesis in her oropharynx. Visualization of vocal cords was difficult due to amount of food material and emesis despite suctioning. On 2nd attempt, ETT was passed successfully and saturations improved shortly thereafter. There was grand mal seizure activity noted after intubation that  abated after Midazolam administration.  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[facility]); Non-suicidal depressed mood; Gender dysphoria; RLL pneumonia; AKI (acute kidney injury) (HCC); Uncontrolled type 2 diabetes mellitus with hyperglycemia, without long-term current use of insulin (HCC); Tobacco abuse; HIV infection (HCC); Positive RPR test; Hyperosmolar hyperglycemic state (HHS) (HCC); Seizure (HCC); Acute hypoxic respiratory failure (HCC); Acute encephalopathy; Hyperammonemia (HCC); Pancytopenia (HCC); Cryptococcosis (HCC); CAP (community acquired pneumonia); Globus sensation; Aspiration pneumonia of both lungs due to vomit (HCC); Cardiac arrest, cause unspecified (HCC); Cerebrovascular accident (CVA) (HCC); and Seizures (HCC) on their problem list.  Significant Hospital Events: Including procedures, antibiotic start and stop dates in addition to other pertinent events   3/16 admit 3/18 neuro consult, found to have acute CVA 3/19 Ongoing aspiration despite obvious dysphagia, was planning to leave AMA but sister talked pt into staying. then had a massive aspiration event leading to worse hypoxia and PEA arrest. Txf to [facility] 3/20 PSV cumulative total of 5-6 hrs, following some commands/ purposefully 3/21 self extubated, was reintubated  Interim History / Subjective:   No acute events overnight Blood sugars are elevated, tube feeds on hold as insulin is being adjusted Patient without complaints Case discussed with ID - Recommended PCP smear and PCR testing with addition of empiric treatment with abx and steroids  No bowel movement yet  Objective:  Blood pressure (!) 136/93, pulse 83, temperature 99 F (37.2 C), resp. rate 16, height 6' (1.829 m), weight 116.3 kg, SpO2 96%.    Vent Mode: PRVC FiO2 (%):  [40 %] 40 % Set Rate:  [  16 bmp] 16 bmp Vt Set:  [550 mL] 550 mL PEEP:  [8  cmH20] 8 cmH20 Plateau Pressure:  [18 cmH20-22 cmH20] 22 cmH20   Intake/Output Summary (Last 24 hours) at 10/03/2023 0724 Last data filed at 10/03/2023 0400 Gross per 24 hour  Intake 2817.67 ml  Output 2930 ml  Net -112.33 ml   Filed Weights   09/29/23 1655 10/01/23 0500 10/03/23 0500  Weight: 116.4 kg 107.3 kg 116.3 kg    Examination: General:  critically ill adult intubated in NAD HEENT: MM pink/moist, ETT/ OGT, eeg in place Neuro: opens eyes to verbal and follows simple commands, weaker on R, PERRL CV: rr, NSR/ ST, no murmur, 2+ pulses PULM:  course breath sounds, no wheezing or chirps GI: obese, soft, bs+, NT, foley- cyu Extremities: warm/dry, no tibial edema  Skin: no rashes    Labs/imaging personally reviewed:  CTA chest 3/16 > no PE, multifocal PNA. CT head 3/18 > neg. MRI brain 3/19 > acute right medullary infarct, cytotoxic edema with no hemorrhage or mass effect, small chronic right cerebellar infarcts. CTA head/neck 3/19 > no LVO, advanced for age atherosclerosis of L vertebral artery and prox basilar artery, possible mod L V4 stenosis.   Assessment & Plan:   PEA arrest Sz like activity  -reportedly purposeful movements 3/20  P -cEEG per neuro, stopped 3/22> no seizures noted -keppra loaded 3/19, now on BID keppra 500mg   -neuro following -supportive care post arrest, avoid fevers   Acute hypoxic respiratory failure Multifocal PNA, CAP  Possible pneumocystis pneumonia Aspiration event Dysphagia  Tobacco use  P - cont MV, daily SBT - peep to 8 and Vt to 7cc/kg to - precedex and PRN fentanyl for sedation - VAP, pulm hygiene - Check Pneumocystis smear and PCR from tracheal aspirate - prn nebs - nicotine patch  - 40mg  solumedrol IV BID and primaquine + clindamycin for pneumocystis coverage (avoiding bactrim due to renal function)  Acute R medullary infarct, cytotoxic edema Hx small R cerebellar infarcts, R PICA territory infarct   Encephalopathy, multifactorial P - Neuro following, cont ASA, plavix, statin - secondary w/u per stroke service  - PT/ OT  HFpEF Hx HTN HLD P - cont to monitor hemodynamics, pending dex response, consider add back pta meds - statin, as above   AKI  - UOP/ sCr stable - hemodynamic support - cont foley, strict I/Os, avoid nephrotoxins - hold lasix today  Normocytic anemia - trend CBC  HIV / AIDS Hx +cryptococcal antigen  P - ID following - descovy per tube - fluconazole 400mg  daily - checking PCP smear and PCR - start primaquine and clindamycin for PCP coverage - pending VL  DM2, with hyperglycemia P - Resistant SSI - semglee 30 units BID - 8 units q4hr TF coverage  Anemia, normocytic, mild - monitor  Hx provoked sz (hyperglycemia/ hypoxemia)  Seizure like activity following PEA arrest 3/19  P - AEDs as above  Hyperthyroidism r/o  -TSH low T3 normal, T4 normal   THC use  - cessation counseling when able  DNR status  - see iPAL note 3/19   Best practice (evaluated daily):  Diet/type: NPO; TF  DVT prophylaxis: LMWH Pressure ulcer(s): pressure ulcer assessment deferred  GI prophylaxis: PPI Lines: N/A Foley:  Yes, and it is still needed Code Status:  DNR Last date of multidisciplinary goals of care discussion: 3/19 IPAL.   Labs   CBC: Recent Labs  Lab 09/26/23 2101 09/26/23 2214 09/28/23 0500 09/29/23 0846 09/30/23  4010 09/30/23 0442 10/01/23 0528 10/01/23 1706 10/02/23 0358 10/03/23 0451  WBC 6.9   < > 6.0 6.5  --  5.5 4.4  --  5.7 4.6  NEUTROABS 5.7  --  4.5 4.9  --   --   --   --   --   --   HGB 11.7*   < > 11.4* 12.0*   < > 10.4* 9.4* 10.2* 10.2* 10.1*  HCT 34.6*   < > 35.2* 36.5*   < > 30.6* 28.1* 30.0* 30.1* 31.9*  MCV 87.4   < > 91.0 91.5  --  87.2 87.8  --  88.0 91.1  PLT 194   < > 221 236  --  195 159  --  181 213   < > = values in this interval not displayed.    Basic Metabolic Panel: Recent Labs  Lab 09/27/23 0518  09/28/23 0500 09/29/23 0846 09/30/23 0436 09/30/23 0442 10/01/23 0528 10/01/23 1706 10/02/23 0358 10/03/23 0451  NA 133*   < > 133*   < > 138 141 142 141 141  K 4.4   < > 3.8   < > 3.1* 3.5 4.5 4.2 5.1  CL 100   < > 97*  --  103 105  --  107 109  CO2 25   < > 25  --  27 25  --  25 24  GLUCOSE 249*   < > 157*  --  115* 223*  --  253* 430*  BUN 19   < > 28*  --  27* 27*  --  25* 43*  CREATININE 1.27*   < > 0.98  --  1.55* 1.56*  --  1.36* 1.66*  CALCIUM 8.8*   < > 9.0  --  8.7* 8.6*  --  8.8* 8.8*  MG 1.9  --   --   --  2.1 2.1  --  2.3 2.8*  PHOS 4.6  --   --   --  1.7* 2.1*  --  3.9 3.8   < > = values in this interval not displayed.   GFR: Estimated Creatinine Clearance (by C-G formula based on SCr of 1.66 mg/dL (H)) Male: 27.2 mL/min (A) Male: 72.4 mL/min (A) Recent Labs  Lab 09/26/23 2333 09/27/23 0102 09/27/23 0518 09/27/23 0532 09/28/23 0500 09/29/23 1747 09/30/23 0442 10/01/23 0528 10/02/23 0358 10/03/23 0451  PROCALCITON  --   --  0.25  --   --   --   --   --   --   --   WBC  --   --  6.6  --    < >  --  5.5 4.4 5.7 4.6  LATICACIDVEN 0.5 0.8  --  1.4  --  1.9  --   --   --   --    < > = values in this interval not displayed.    Liver Function Tests: Recent Labs  Lab 09/26/23 2101 09/27/23 0518 09/28/23 0500 09/29/23 0846  AST 24 26 33 18  ALT 45* 42 39 31  ALKPHOS 78 76 71 71  BILITOT 0.7 0.5 1.1 0.6  PROT 9.3* 9.2* 9.6* 9.1*  ALBUMIN 3.3* 3.1* 3.5 3.1*   No results for input(s): "LIPASE", "AMYLASE" in the last 168 hours. Recent Labs  Lab 09/29/23 1747  AMMONIA 30    ABG    Component Value Date/Time   PHART 7.500 (H) 10/01/2023 1706   PCO2ART 35.7 10/01/2023 1706   PO2ART 239 (  H) 10/01/2023 1706   HCO3 27.8 10/01/2023 1706   TCO2 29 10/01/2023 1706   ACIDBASEDEF 8.1 (H) 08/25/2023 1920   O2SAT 100 10/01/2023 1706     Coagulation Profile: Recent Labs  Lab 09/26/23 2101  INR 1.0    Cardiac Enzymes: Recent Labs  Lab  09/27/23 0518  CKTOTAL 67    HbA1C: Hgb A1c MFr Bld  Date/Time Value Ref Range Status  09/29/2023 08:46 AM 10.8 (H) 4.8 - 5.6 % Final    Comment:    (NOTE) Pre diabetes:          5.7%-6.4%  Diabetes:              >6.4%  Glycemic control for   <7.0% adults with diabetes   08/26/2023 03:28 AM 11.7 (H) 4.8 - 5.6 % Final    Comment:    (NOTE) Pre diabetes:          5.7%-6.4%  Diabetes:              >6.4%  Glycemic control for   <7.0% adults with diabetes     CBG: Recent Labs  Lab 10/02/23 1514 10/02/23 1758 10/02/23 2001 10/02/23 2350 10/03/23 0357  GLUCAP 427* 433* 375* 376* 402*    CRITICAL CARE Performed by: Martina Sinner   Total critical care time: 40 minutes  Critical care time was exclusive of separately billable procedures and treating other patients. Critical care was necessary to treat or prevent imminent or life-threatening deterioration.  Critical care was time spent personally by me on the following activities: development of treatment plan with patient and/or surrogate as well as nursing, discussions with consultants, evaluation of patient's response to treatment, examination of patient, obtaining history from patient or surrogate, ordering and performing treatments and interventions, ordering and review of laboratory studies, ordering and review of radiographic studies, pulse oximetry and re-evaluation of patient's condition.    Melody Comas, MD [facility] Office: [phone]   See Amion for personal pager PCCM on call pager [phone] until 7pm. Please call Elink 7p-7a. [phone]

## 2023-10-03 NOTE — Progress Notes (Signed)
 "STROKE TEAM PROGRESS NOTE    SIGNIFICANT HOSPITAL EVENTS  3/16: Admitted for defeat dysphagia, found to have multifocal pneumonia likely due to aspiration. 3/18: Developed right facial droop and right-sided weakness.  MRI shows acute right medullary infarct  3/19: Developed hypoxia and respiratory distress leading to cardiac arrest with 9-minute downtime followed by seizure episode.  Difficult airway was noted due to emesis.  Transferred to ICU.  Cerebellar EEG negative LTM EEG placed overnight  INTERIM HISTORY/SUBJECTIVE  No family at the bedside.  Patient remains intubated and on ventilatory support for respiratory failure.  He is awake and follows commands well and moves all 4 extremities well against gravity.Neurological exam remains the same with right gaze preference dysconjugate gaze, moves all extremities follows commands Vital signs stable. OBJECTIVE  CBC    Component Value Date/Time   WBC 4.6 10/03/2023 0451   RBC 3.50 (L) 10/03/2023 0451   HGB 10.1 (L) 10/03/2023 0451   HCT 31.9 (L) 10/03/2023 0451   PLT 213 10/03/2023 0451   MCV 91.1 10/03/2023 0451   MCH 28.9 10/03/2023 0451   MCHC 31.7 10/03/2023 0451   RDW 15.4 10/03/2023 0451   LYMPHSABS 0.8 09/29/2023 0846   MONOABS 0.7 09/29/2023 0846   EOSABS 0.0 09/29/2023 0846   BASOSABS 0.0 09/29/2023 0846    BMET    Component Value Date/Time   NA 141 10/03/2023 0451   K 5.1 10/03/2023 0451   CL 109 10/03/2023 0451   CO2 24 10/03/2023 0451   GLUCOSE 430 (H) 10/03/2023 0451   BUN 43 (H) 10/03/2023 0451   CREATININE 1.66 (H) 10/03/2023 0451   CREATININE 1.03 05/22/2020 1128   CALCIUM 8.8 (L) 10/03/2023 0451   GFRNONAA 51 (L) 10/03/2023 0451   GFRNONAA 89 05/22/2020 1128    IMAGING past 24 hours DG Abd 1 View Result Date: 10/03/2023 CLINICAL DATA:  Enteric tube placement EXAM: ABDOMEN - 1 VIEW COMPARISON:  Abdominal radiograph dated 09/29/2023 FINDINGS: Interval replacement of enteric tube with tip projecting  over the distal stomach/proximal duodenum. Nonspecific partially imaged gas-filled bowel loops in the upper abdomen. Partially imaged lower lungs with dense left retrocardiac opacity. IMPRESSION: Interval replacement of enteric tube with tip projecting over the distal stomach/proximal duodenum. Electronically Signed   By: Agustin Cree M.D.   On: 10/03/2023 11:57    Vitals:   10/03/23 1400 10/03/23 1417 10/03/23 1500 10/03/23 1600  BP: (!) 161/102  (!) 150/95 (!) 161/99  Pulse: 72 79 78 75  Resp: 19 17 16 16   Temp: (!) 100.8 F (38.2 C) 99.5 F (37.5 C) 100 F (37.8 C) (!) 100.4 F (38 C)  TempSrc:      SpO2: 99% 98% 98% 98%  Weight:      Height:         PHYSICAL EXAM General: Critically ill in no apparent distress Psych:  Mood and affect appropriate for situation CV: Regular rate and rhythm on monitor Respiratory:  Regular, unlabored respirations mechanical ventilator GI: Abdomen soft and nontender   NEURO:  Intubated, sedated.  Eyes are closed open opens to voice, following commands Cranial Nerves:  II: Pupils equal and reactive III, IV, VI: Midline, tracks bilaterally, blinks bilaterally VII: UTA due to ETT VIII: hearing intact to voice. IX, X: Cough and gag present. ZO:[id number] shrug 5/5. XII: UTA Motor/sensory: BUE: 4/5, equal grips BLE: 4/5 Coordination: Deferred Gait- deferred  Most Recent NIH: 8   ASSESSMENT/PLAN  Ms. Severus Brodzinski is a 48 y.o. adult with history of  HIV, DM, HTN, HLD, seizures in the setting of severe hyperglycemia, current smoker who presented to ED due to inability to swallow for 5 days, admitted for pnemonia. Neurology consulted 3/19 for dysphagia.  NIH on consult: 3.  Respiratory failure Cardiac arrest  Aspiration pneumonia  Developed hypoxia and respiratory distress leading to cardiac arrest with 9-minute downtime followed by seizure episode.  Difficult airway was noted due to emesis.  Intubated 3/19-> self-extubated 3/21->  re-intubated 3/21 Vent management per CCM Appreciate CCM assistance fentanyl and propofol off Precedex infusion at 0.8 mcg On rocephin and flagyl  Stroke:  right lateral medullary infarct, etiology: small or large vessel disease from uncontrolled stroke risk factors    CT head x 2 no acute finding CTA head & neck no LVO, advanced for age arthrosclerosis L vertebral V4 segment and proximal basilar artery MRI  Acute Right Medullary Infarct. Cytotoxic edema with no hemorrhage or mass effect. 2D Echo: LVEF 60-65%, Grade I diastolic dysfunction LDL 95 HgbA1c 10.8 UDS positive for THC VTE prophylaxis -Lovenox No antithrombotic prior to admission, now on aspirin 81 mg daily and clopidogrel 75 mg daily.  For 3 months then aspirin alone Therapy recommendations:  Pending Disposition:  pending  Seizure  Post cardiac arrest seizure activity On keppra Now on LTM EEG LTM 3/21 This study is suggestive of mild to moderate diffuse encephalopathy. No seizures or epileptiform discharges were seen throughout the recording.  LTM 3/22 This study is suggestive of mild to moderate diffuse encephalopathy. No seizures or epileptiform discharges were seen throughout the recording. Will DC LTM today  Hx of Hypertension Now hypotensive Home meds:  lopressor Stable Levophed off since 3/20 Avoid hypotension, goal MAP >65 Long term BP goal normotensive  Hyperlipidemia Home meds:  none LDL 95, goal < 70 Add Lipitor 20mg  Continue statin on discharge  Diabetes type II Uncontrolled Home meds: Lantus insulin (previously noted noncompliance) HgbA1c 10.8, goal < 7.0 CBGs SSI Recommend close follow-up with PCP for better DM control  Tobacco Abuse Patient smokes 0.5 packs per day for 26 years      Ready to quit? N/A Nicotine replacement therapy provided  Substance Abuse UDS positive for  THC       Ready to quit? 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 These were not seen on 08/28/23 MRI  Other Active Problems Hx of HIV, Home Meds: Biktarvy, bactrim and diflucan   Hospital day # 7  Pt seen by Neuro NP/APP and later by MD. Note/plan to be edited by MD as needed.       Patient remains intubated for respiratory failure.  Continue ventilatory support for respiratory failure as per critical care team.  Continue aspirin and Plavix for 3 months followed by aspirin alone for his intracranial atherosclerosis and stroke.  Repeat trial of extubation in a few days if patient does well.  No family at the bedside.  Discussed with Dr. Francine Graven critical care medicine.   This patient is critically ill and at significant risk of neurological worsening, death and care requires constant monitoring of vital signs, hemodynamics,respiratory and cardiac monitoring, extensive review of multiple databases, frequent  neurological assessment, discussion with family, other specialists and medical decision making of high complexity.I have made any additions or clarifications directly to the above note.This critical care time does not reflect procedure time, or teaching time or supervisory time of PA/NP/Med Resident etc but could involve care discussion time.  I spent 30 minutes of neurocritical care time  in the care of  this patient.        Delia Heady, MD Medical Director [facility] Pager: [phone] 10/03/2023 4:41 PM

## 2023-10-03 NOTE — Plan of Care (Signed)
  Problem: Education: Goal: Ability to describe self-care measures that may prevent or decrease complications (Diabetes Survival Skills Education) will improve Outcome: Not Progressing   Problem: Coping: Goal: Ability to adjust to condition or change in health will improve Outcome: Progressing   Problem: Fluid Volume: Goal: Ability to maintain a balanced intake and output will improve Outcome: Not Progressing   Problem: Health Behavior/Discharge Planning: Goal: Ability to identify and utilize available resources and services will improve Outcome: Progressing Goal: Ability to manage health-related needs will improve Outcome: Progressing   Problem: Metabolic: Goal: Ability to maintain appropriate glucose levels will improve Outcome: Not Progressing   Problem: Nutritional: Goal: Maintenance of adequate nutrition will improve Outcome: Progressing Goal: Progress toward achieving an optimal weight will improve Outcome: Not Progressing   Problem: Skin Integrity: Goal: Risk for impaired skin integrity will decrease Outcome: Progressing   Problem: Clinical Measurements: Goal: Ability to maintain clinical measurements within normal limits will improve Outcome: Progressing Goal: Will remain free from infection Outcome: Progressing Goal: Respiratory complications will improve Outcome: Progressing Goal: Cardiovascular complication will be avoided Outcome: Progressing   Problem: Education: Goal: Knowledge of disease or condition will improve Outcome: Not Progressing   Problem: Ischemic Stroke/TIA Tissue Perfusion: Goal: Complications of ischemic stroke/TIA will be minimized Outcome: Progressing   Problem: Health Behavior/Discharge Planning: Goal: Ability to manage health-related needs will improve Outcome: Progressing   Problem: Activity: Goal: Ability to tolerate increased activity will improve Outcome: Progressing   Problem: Respiratory: Goal: Ability to maintain a  clear airway and adequate ventilation will improve Outcome: Progressing   Problem: Role Relationship: Goal: Method of communication will improve Outcome: Progressing

## 2023-10-03 NOTE — Progress Notes (Addendum)
 Subjective: Gesturing for tube to come out  Antibiotics:  Anti-infectives (From admission, onward)    Start     Dose/Rate Route Frequency Ordered Stop   10/03/23 1100  clindamycin (CLEOCIN) IVPB 900 mg        900 mg 100 mL/hr over 30 Minutes Intravenous Every 8 hours 10/03/23 1014     10/03/23 1100  primaquine tablet 30 mg        30 mg Per NG tube Daily 10/03/23 1014     10/02/23 2200  metroNIDAZOLE (FLAGYL) tablet 500 mg  Status:  Discontinued        500 mg Per Tube Every 12 hours 10/02/23 1140 10/03/23 1014   09/30/23 1030  dolutegravir (TIVICAY) tablet 50 mg        50 mg Per Tube Daily 09/30/23 0931     09/30/23 1030  emtricitabine-tenofovir AF (DESCOVY) 200-25 MG per tablet 1 tablet        1 tablet Per Tube Daily 09/30/23 0931     09/30/23 1000  sulfamethoxazole-trimethoprim (BACTRIM DS) 800-160 MG per tablet 1 tablet  Status:  Discontinued        1 tablet Per Tube Daily 09/29/23 1637 10/03/23 1014   09/29/23 1800  metroNIDAZOLE (FLAGYL) IVPB 500 mg  Status:  Discontinued        500 mg 100 mL/hr over 60 Minutes Intravenous Every 12 hours 09/29/23 1716 10/02/23 1140   09/29/23 1400  cefTRIAXone (ROCEPHIN) 2 g in sodium chloride 0.9 % 100 mL IVPB        2 g 200 mL/hr over 30 Minutes Intravenous Every 24 hours 09/29/23 1317 10/02/23 1420   09/28/23 1445  fluconazole (DIFLUCAN) IVPB 400 mg        400 mg 100 mL/hr over 120 Minutes Intravenous Daily 09/28/23 1359     09/27/23 1600  cefTRIAXone (ROCEPHIN) 2 g in sodium chloride 0.9 % 100 mL IVPB  Status:  Discontinued        2 g 200 mL/hr over 30 Minutes Intravenous Daily 09/27/23 1004 09/29/23 1317   09/27/23 1000  azithromycin (ZITHROMAX) tablet 500 mg  Status:  Discontinued        500 mg Oral Daily 09/26/23 2307 09/27/23 1004   09/27/23 1000  bictegravir-emtricitabine-tenofovir AF (BIKTARVY) 50-200-25 MG per tablet 1 tablet  Status:  Discontinued        1 tablet Oral Daily 09/27/23 0004 09/30/23 0931   09/27/23 1000   sulfamethoxazole-trimethoprim (BACTRIM DS) 800-160 MG per tablet 1 tablet  Status:  Discontinued        1 tablet Oral Daily 09/27/23 0017 09/29/23 1637   09/27/23 0800  ceFEPIme (MAXIPIME) 2 g in sodium chloride 0.9 % 100 mL IVPB  Status:  Discontinued        2 g 200 mL/hr over 30 Minutes Intravenous Every 8 hours 09/26/23 2333 09/27/23 1004   09/27/23 0000  fluconazole (DIFLUCAN) tablet 400 mg  Status:  Discontinued        400 mg Oral Daily 09/26/23 2352 09/28/23 1359   09/26/23 2300  ceFEPIme (MAXIPIME) 2 g in sodium chloride 0.9 % 100 mL IVPB        2 g 200 mL/hr over 30 Minutes Intravenous  Once 09/26/23 2245 09/26/23 2357   09/26/23 2300  vancomycin (VANCOREADY) IVPB 2000 mg/400 mL        2,000 mg 200 mL/hr over 120 Minutes Intravenous  Once 09/26/23 2245 09/27/23 0305  Medications: Scheduled Meds:  aspirin  81 mg Per Tube Daily   atorvastatin  20 mg Per Tube Daily   Chlorhexidine Gluconate Cloth  6 each Topical Daily   clopidogrel  75 mg Per Tube Daily   docusate  100 mg Per Tube BID   dolutegravir  50 mg Per Tube Daily   emtricitabine-tenofovir AF  1 tablet Per Tube Daily   enoxaparin (LOVENOX) injection  60 mg Subcutaneous Q24H   feeding supplement (PROSource TF20)  60 mL Per Tube Daily   insulin aspart  0-20 Units Subcutaneous Q4H   insulin aspart  8 Units Subcutaneous Q4H   insulin glargine  30 Units Subcutaneous BID   levalbuterol  0.63 mg Nebulization Q6H   methylPREDNISolone (SOLU-MEDROL) injection  40 mg Intravenous BID   multivitamin with minerals  1 tablet Per Tube Daily   nicotine  14 mg Transdermal Daily   mouth rinse  15 mL Mouth Rinse Q2H   pneumococcal 20-valent conjugate vaccine  0.5 mL Intramuscular Tomorrow-1000   polyethylene glycol  17 g Per Tube BID   primaquine  30 mg Per NG tube Daily   senna  2 tablet Per Tube QHS   Continuous Infusions:  clindamycin (CLEOCIN) IV 900 mg (10/03/23 1236)   dexmedetomidine (PRECEDEX) IV infusion 1.2 mcg/kg/hr  (10/03/23 1235)   feeding supplement (OSMOLITE 1.5 CAL) 1,000 mL (10/03/23 1235)   fluconazole (DIFLUCAN) IV 100 mL/hr at 10/03/23 1200   levETIRAcetam Stopped (10/03/23 0949)   PRN Meds:.acetaminophen **OR** acetaminophen, bisacodyl, fentaNYL (SUBLIMAZE) injection, hydrALAZINE, labetalol, levalbuterol, midazolam, ondansetron **OR** ondansetron (ZOFRAN) IV, mouth rinse    Objective: Weight change:   Intake/Output Summary (Last 24 hours) at 10/03/2023 1348 Last data filed at 10/03/2023 1200 Gross per 24 hour  Intake 2525.28 ml  Output 3580 ml  Net -1054.72 ml   Blood pressure (!) 149/95, pulse 67, temperature 98.8 F (37.1 C), resp. rate (!) 22, height 6' (1.829 m), weight 116.3 kg, SpO2 98%. Temp:  [96.8 F (36 C)-99.9 F (37.7 C)] 98.8 F (37.1 C) (03/23 1100) Pulse Rate:  [67-98] 67 (03/23 1100) Resp:  [16-31] 22 (03/23 1100) BP: (106-154)/(76-98) 149/95 (03/23 1100) SpO2:  [94 %-98 %] 98 % (03/23 1100) FiO2 (%):  [40 %] 40 % (03/23 1100) Weight:  [116.3 kg] 116.3 kg (03/23 0500)  Physical Exam: Physical Exam Constitutional:      Appearance: She is obese.     Interventions: She is intubated.  Cardiovascular:     Heart sounds: No murmur heard.    No friction rub. No gallop.  Pulmonary:     Effort: She is intubated.     Breath sounds: No wheezing or rhonchi.  Musculoskeletal:        General: Normal range of motion.  Skin:    General: Skin is warm.  Neurological:     General: No focal deficit present.     Mental Status: She is alert and oriented to person, place, and time.      CBC:    BMET Recent Labs    10/02/23 0358 10/03/23 0451  NA 141 141  K 4.2 5.1  CL 107 109  CO2 25 24  GLUCOSE 253* 430*  BUN 25* 43*  CREATININE 1.36* 1.66*  CALCIUM 8.8* 8.8*     Liver Panel  No results for input(s): "PROT", "ALBUMIN", "AST", "ALT", "ALKPHOS", "BILITOT", "BILIDIR", "IBILI" in the last 72 hours.     Sedimentation Rate No results for input(s):  "ESRSEDRATE" in the last 72  hours. C-Reactive Protein No results for input(s): "CRP" in the last 72 hours.  Micro Results: Recent Results (from the past 720 hours)  Blood culture (routine x 2)     Status: None   Collection Time: 09/26/23  9:01 PM   Specimen: BLOOD  Result Value Ref Range Status   Specimen Description   Final    BLOOD LEFT ANTECUBITAL Performed at [facility], [address] [place], [place] [address]    Special Requests   Final    BOTTLES DRAWN AEROBIC AND ANAEROBIC Blood Culture adequate volume Performed at [facility], [address], [place], [place] [address]    Culture   Final    NO GROWTH 5 DAYS Performed at [facility], [address], [place], [place] [address]    Report Status 10/01/2023 FINAL  Final  Blood culture (routine x 2)     Status: None   Collection Time: 09/26/23  9:01 PM   Specimen: BLOOD  Result Value Ref Range Status   Specimen Description   Final    BLOOD RIGHT ANTECUBITAL Performed at [facility], [address] [place], [place] [address]    Special Requests   Final    BOTTLES DRAWN AEROBIC AND ANAEROBIC Blood Culture adequate volume Performed at [facility], [address] [place], [place] [address]    Culture   Final    NO GROWTH 5 DAYS Performed at [facility], [address] [place], [place] [address]    Report Status 10/01/2023 FINAL  Final  Resp panel by RT-PCR (RSV, Flu A&B, Covid)     Status: None   Collection Time: 09/26/23  9:01 PM   Specimen: Nasal Swab  Result Value Ref Range Status   SARS Coronavirus 2 by RT PCR NEGATIVE NEGATIVE Final    Comment: (NOTE) SARS-CoV-2 target nucleic acids are NOT DETECTED.  The SARS-CoV-2 RNA is generally detectable in upper respiratory specimens during the acute phase of infection. The lowest concentration of SARS-CoV-2 viral copies this assay can detect is 138 copies/mL. A negative  result does not preclude SARS-Cov-2 infection and should not be used as the sole basis for treatment or other patient management decisions. A negative result may occur with  improper specimen collection/handling, submission of specimen other than nasopharyngeal swab, presence of viral mutation(s) within the areas targeted by this assay, and inadequate number of viral copies(<138 copies/mL). A negative result must be combined with clinical observations, patient history, and epidemiological information. The expected result is Negative.  Fact Sheet for Patients:  BloggerCourse.com  Fact Sheet for Healthcare Providers:  SeriousBroker.it  This test is no t yet approved or cleared by the [place] FDA and  has been authorized for detection and/or diagnosis of SARS-CoV-2 by FDA under an Emergency Use Authorization (EUA). This EUA will remain  in effect (meaning this test can be used) for the duration of the COVID-19 declaration under Section 564(b)(1) of the Act, 21 U.S.C.section 360bbb-3(b)(1), unless the authorization is terminated  or revoked sooner.       Influenza A by PCR NEGATIVE NEGATIVE Final   Influenza B by PCR NEGATIVE NEGATIVE Final    Comment: (NOTE) The Xpert Xpress SARS-CoV-2/FLU/RSV plus assay is intended as an aid in the diagnosis of influenza from Nasopharyngeal swab specimens and should not be used as a sole basis for treatment. Nasal washings and aspirates are unacceptable for Xpert Xpress SARS-CoV-2/FLU/RSV testing.  Fact Sheet for  Patients: BloggerCourse.com  Fact Sheet for Healthcare Providers: SeriousBroker.it  This test is not yet approved or cleared by the [place] FDA and has been authorized for detection and/or diagnosis of SARS-CoV-2 by FDA under an Emergency Use Authorization (EUA). This EUA will remain in effect (meaning this test can be used)  for the duration of the COVID-19 declaration under Section 564(b)(1) of the Act, 21 U.S.C. section 360bbb-3(b)(1), unless the authorization is terminated or revoked.     Resp Syncytial Virus by PCR NEGATIVE NEGATIVE Final    Comment: (NOTE) Fact Sheet for Patients: BloggerCourse.com  Fact Sheet for Healthcare Providers: SeriousBroker.it  This test is not yet approved or cleared by the [place] FDA and has been authorized for detection and/or diagnosis of SARS-CoV-2 by FDA under an Emergency Use Authorization (EUA). This EUA will remain in effect (meaning this test can be used) for the duration of the COVID-19 declaration under Section 564(b)(1) of the Act, 21 U.S.C. section 360bbb-3(b)(1), unless the authorization is terminated or revoked.  Performed at [facility], [address], [place], [place] [address]   MRSA Next Gen by PCR, Nasal     Status: None   Collection Time: 09/27/23  8:10 AM   Specimen: Nasal Mucosa; Nasal Swab  Result Value Ref Range Status   MRSA by PCR Next Gen NOT DETECTED NOT DETECTED Final    Comment: (NOTE) The GeneXpert MRSA Assay (FDA approved for NASAL specimens only), is one component of a comprehensive MRSA colonization surveillance program. It is not intended to diagnose MRSA infection nor to guide or monitor treatment for MRSA infections. Test performance is not FDA approved in patients less than 81 years old. Performed at [facility], [address], [place], [place] [address]   Expectorated Sputum Assessment w Gram Stain, Rflx to Resp Cult     Status: None   Collection Time: 09/27/23  9:04 AM   Specimen: Sputum  Result Value Ref Range Status   Specimen Description SPU  Final   Special Requests Immunocompromised  Final   Sputum evaluation   Final    THIS SPECIMEN IS ACCEPTABLE FOR SPUTUM CULTURE Performed at [facility], [address] [place], [place] [address]    Report Status 09/27/2023 FINAL  Final  Culture, Respiratory w Gram Stain     Status: None   Collection Time: 09/27/23  9:04 AM   Specimen: Sputum  Result Value Ref Range Status   Specimen Description   Final    SPU Performed at [facility], [address], [place], [place] [address]    Special Requests   Final    Immunocompromised Reflexed from [id number] Performed at [facility], [address] [place], [place] [address]    Gram Stain   Final    ABUNDANT WBC PRESENT,BOTH PMN AND MONONUCLEAR MODERATE SQUAMOUS EPITHELIAL CELLS PRESENT MODERATE GRAM POSITIVE COCCI IN PAIRS RARE GRAM NEGATIVE RODS FEW GRAM POSITIVE RODS    Culture   Final    FEW Normal respiratory flora-no Staph aureus or Pseudomonas seen Performed at [facility], [address], [place], [place] [address]    Report Status 09/29/2023 FINAL  Final  Culture, Respiratory w Gram Stain     Status: None   Collection Time: 09/29/23  5:08 PM   Specimen: Tracheal Aspirate; Respiratory  Result Value Ref Range Status   Specimen Description   Final    TRACHEAL ASPIRATE Performed at [facility], [address], [place], [place] [address]    Special Requests   Final    NONE Performed at [facility], [address], [place], [place] [address]    Gram Stain NO WBC SEEN NO ORGANISMS SEEN   Final   Culture   Final    RARE Normal respiratory flora-no Staph aureus or Pseudomonas seen Performed at [facility], [address], [place], [place] [address]    Report Status 10/02/2023 FINAL  Final  MRSA Next Gen by PCR, Nasal     Status: None   Collection Time: 09/30/23  1:49 AM   Specimen: Nasal Mucosa; Nasal Swab  Result Value Ref Range Status   MRSA by PCR Next Gen NOT DETECTED NOT DETECTED Final    Comment: (NOTE) The GeneXpert MRSA Assay (FDA approved for NASAL specimens only), is one  component of a comprehensive MRSA colonization surveillance program. It is not intended to diagnose MRSA infection nor to guide or monitor treatment for MRSA infections. Test performance is not FDA approved in patients less than 60 years old. Performed at [facility], [address] [place], [place] [address]     Studies/Results: DG Abd 1 View Result Date: 10/03/2023 CLINICAL DATA:  Enteric tube placement EXAM: ABDOMEN - 1 VIEW COMPARISON:  Abdominal radiograph dated 09/29/2023 FINDINGS: Interval replacement of enteric tube with tip projecting over the distal stomach/proximal duodenum. Nonspecific partially imaged gas-filled bowel loops in the upper abdomen. Partially imaged lower lungs with dense left retrocardiac opacity. IMPRESSION: Interval replacement of enteric tube with tip projecting over the distal stomach/proximal duodenum. Electronically Signed   By: Agustin Cree M.D.   On: 10/03/2023 11:57   DG Chest Port 1 View Result Date: 10/01/2023 CLINICAL DATA:  Intubated EXAM: PORTABLE CHEST 1 VIEW COMPARISON:  09/30/2023, 09/29/2023, 09/26/2023 FINDINGS: Endotracheal tube tip is partially obscured by feeding tube. Tip appears to be about 3.7 cm superior to carina. Enteric tube tip below the diaphragm but incompletely assessed. Cardiomegaly with worsening bibasilar airspace disease. No pleural effusion or pneumothorax. IMPRESSION: 1. Endotracheal tube tip partially obscured by feeding tube. Tip appears to be about 3.7 cm superior to carina. 2. Cardiomegaly with worsening bibasilar airspace disease, this could be due to atelectasis, pneumonia or aspiration Electronically Signed   By: Jasmine Pang M.D.   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  She had hypoxia and respiratory failure and while being treated for aspiration pneumonia progressed and ultimately needed to be intubated.  #1 Hypoxemic respiratory failure:  Tracheal aspirate negative for bacterial pathogens  I  was concerned she might have PCP pneumonia and decided to start empiric treatment today after we obtain PCP smear and PCR.  Because she is hyperkalemic and has acute kidney injury we cannot use Bactrim so we will plan on using IV clindamycin with crushed primaclone and increased her solumedrol dose I have also ordered a G6PD level   The clindamycin will also cover for aspiration organisms  #2 HIV/AIDS:   Her viral load was 3410 on the 21st indicating some adherence to her therapy though clearly between January and today there has been interruption in treatrment or she would have long since been with VL <50 CD4 is still quite low at 36 percentage is increased  Currently on TIVICAY and DESCOVY via the NG tube but can be switched back to [facility] when she is extubated  #3 cryptococcal disease I presume that her pulmonary pathology that she had previously was due to cryptococcus she did not have cryptococcal meningitis she is currently on fluconazole which she will continue  #4 Transgender identity: It is my understanding that she identifies as being male though  the chart only uses male pronouns would make sure we use the pronouns that she wants [place] to use.  We can also help her with transitioning  Interestingly  the most predictive variable in ananalysis  presented at [facility] showed that amongst transgender women being engaged in HIV care was whether or not they were receiving estradiol  #4 CVA and seizures: being followed by Neurology I presume there is no concern that CVA is due to untreated cryptococcal meningitis --if the patient was not taking her fluconazole  CRITICAL CARE Performed by: Paulette Blanch [place]   Total critical care time: 34 minutes  Critical care time was exclusive of separately billable procedures and treating other patients.  Critical care was necessary to treat or prevent imminent or life-threatening deterioration.  Critical care was time spent personally by me on the following activities: development of treatment plan with patient and/or surrogate as well as nursing, discussions with consultants, evaluation of patient's response to treatment, examination of patient, obtaining history from patient or surrogate, ordering and performing treatments and interventions, ordering and review of laboratory studies, ordering and review of radiographic studies, pulse oximetry and re-evaluation of patient's condition. Evaluation of the patient requires complex antimicrobial therapy evaluation, counseling , isolation needs to reduce disease transmission and risk assessment and mitigation.     LOS: 7 days   Acey Lav 10/03/2023, 1:48 PM

## 2023-10-03 NOTE — Progress Notes (Signed)
 Hypertensive . BP inspite recent labetalol is currently 178/105, pulse 79. Contacted Elink to express concerns.

## 2023-10-03 NOTE — Progress Notes (Addendum)
 eLink Physician-Brief Progress Note Patient Name: Jacorie Ernsberger DOB: [birth year] MRN: [id number]   Date of Service  10/03/2023  HPI/Events of Note  Ongoing agitation, self extubated, recent seizure and stroke at high risk of complications  eICU Interventions  Renew restraints   2350 -refractory hypertension despite hydralazine and labetalol.  178/105.  Reviewed home meds-the patient is on metoprolol and diltiazem extended release no other antihypertensives.  Will initiate the diltiazem given the concurrent tachycardia.  Preserved EF.  LVH present.  Intervention Category Minor Interventions: Routine modifications to care plan (e.g. PRN medications for pain, fever);Agitation / anxiety - evaluation and management  [place] Landgrebe 10/03/2023, 8:41 PM

## 2023-10-03 NOTE — Plan of Care (Signed)
  Problem: Education: Goal: Ability to describe self-care measures that may prevent or decrease complications (Diabetes Survival Skills Education) will improve Outcome: Not Applicable Goal: Individualized Educational Video(s) Outcome: Not Applicable   Problem: Coping: Goal: Ability to adjust to condition or change in health will improve Outcome: Not Applicable   Problem: Fluid Volume: Goal: Ability to maintain a balanced intake and output will improve Outcome: Not Applicable

## 2023-10-04 DIAGNOSIS — I469 Cardiac arrest, cause unspecified: Secondary | ICD-10-CM | POA: Diagnosis not present

## 2023-10-04 DIAGNOSIS — I6389 Other cerebral infarction: Secondary | ICD-10-CM

## 2023-10-04 DIAGNOSIS — J189 Pneumonia, unspecified organism: Secondary | ICD-10-CM | POA: Diagnosis not present

## 2023-10-04 DIAGNOSIS — R1310 Dysphagia, unspecified: Secondary | ICD-10-CM | POA: Diagnosis not present

## 2023-10-04 DIAGNOSIS — J9601 Acute respiratory failure with hypoxia: Secondary | ICD-10-CM | POA: Diagnosis not present

## 2023-10-04 LAB — GLUCOSE, CAPILLARY
Glucose-Capillary: 229 mg/dL — ABNORMAL HIGH (ref 70–99)
Glucose-Capillary: 264 mg/dL — ABNORMAL HIGH (ref 70–99)
Glucose-Capillary: 275 mg/dL — ABNORMAL HIGH (ref 70–99)
Glucose-Capillary: 278 mg/dL — ABNORMAL HIGH (ref 70–99)
Glucose-Capillary: 303 mg/dL — ABNORMAL HIGH (ref 70–99)
Glucose-Capillary: 327 mg/dL — ABNORMAL HIGH (ref 70–99)

## 2023-10-04 LAB — CBC
HCT: 33.5 % — ABNORMAL LOW (ref 39.0–52.0)
Hemoglobin: 10.6 g/dL — ABNORMAL LOW (ref 13.0–17.0)
MCH: 29.4 pg (ref 26.0–34.0)
MCHC: 31.6 g/dL (ref 30.0–36.0)
MCV: 93.1 fL (ref 80.0–100.0)
Platelets: 203 10*3/uL (ref 150–400)
RBC: 3.6 MIL/uL — ABNORMAL LOW (ref 4.22–5.81)
RDW: 15 % (ref 11.5–15.5)
WBC: 4.9 10*3/uL (ref 4.0–10.5)
nRBC: 0 % (ref 0.0–0.2)

## 2023-10-04 LAB — BASIC METABOLIC PANEL
Anion gap: 12 (ref 5–15)
BUN: 49 mg/dL — ABNORMAL HIGH (ref 6–20)
CO2: 22 mmol/L (ref 22–32)
Calcium: 9.3 mg/dL (ref 8.9–10.3)
Chloride: 109 mmol/L (ref 98–111)
Creatinine, Ser: 1.63 mg/dL — ABNORMAL HIGH (ref 0.61–1.24)
GFR, Estimated: 52 mL/min — ABNORMAL LOW (ref >60)
Glucose, Bld: 268 mg/dL — ABNORMAL HIGH (ref 70–99)
Potassium: 5.4 mmol/L — ABNORMAL HIGH (ref 3.5–5.1)
Sodium: 143 mmol/L (ref 135–145)

## 2023-10-04 LAB — MAGNESIUM: Magnesium: 2.6 mg/dL — ABNORMAL HIGH (ref 1.7–2.4)

## 2023-10-04 LAB — PHOSPHORUS: Phosphorus: 4.5 mg/dL (ref 2.5–4.6)

## 2023-10-04 MED ORDER — INSULIN ASPART 100 UNIT/ML IJ SOLN
12.0000 [IU] | INTRAMUSCULAR | Status: DC
  Administered 2023-10-04 – 2023-10-05 (×5): 12 [IU] via SUBCUTANEOUS

## 2023-10-04 MED ORDER — ENOXAPARIN SODIUM 60 MG/0.6ML IJ SOSY
50.0000 mg | PREFILLED_SYRINGE | INTRAMUSCULAR | Status: AC
  Administered 2023-10-04 – 2023-10-09 (×6): 50 mg via SUBCUTANEOUS
  Filled 2023-10-04 (×6): qty 0.6

## 2023-10-04 MED ORDER — INSULIN GLARGINE-YFGN 100 UNIT/ML ~~LOC~~ SOLN
35.0000 [IU] | Freq: Two times a day (BID) | SUBCUTANEOUS | Status: DC
  Administered 2023-10-04: 35 [IU] via SUBCUTANEOUS
  Filled 2023-10-04 (×3): qty 0.35

## 2023-10-04 MED ORDER — INSULIN GLARGINE 100 UNIT/ML ~~LOC~~ SOLN
35.0000 [IU] | Freq: Two times a day (BID) | SUBCUTANEOUS | Status: DC
  Administered 2023-10-04: 35 [IU] via SUBCUTANEOUS
  Filled 2023-10-04 (×2): qty 0.35

## 2023-10-04 MED ORDER — INSULIN GLARGINE 100 UNIT/ML ~~LOC~~ SOLN
35.0000 [IU] | Freq: Two times a day (BID) | SUBCUTANEOUS | Status: DC
  Filled 2023-10-04 (×2): qty 0.35

## 2023-10-04 MED ORDER — FAMOTIDINE 20 MG PO TABS
20.0000 mg | ORAL_TABLET | Freq: Every day | ORAL | Status: DC
  Administered 2023-10-04 – 2023-10-11 (×8): 20 mg
  Filled 2023-10-04 (×9): qty 1

## 2023-10-04 NOTE — Progress Notes (Signed)
 "STROKE TEAM PROGRESS NOTE    SIGNIFICANT HOSPITAL EVENTS  3/16: Admitted for defeat dysphagia, found to have multifocal pneumonia likely due to aspiration. 3/18: Developed right facial droop and right-sided weakness.  MRI shows acute right medullary infarct  3/19: Developed hypoxia and respiratory distress leading to cardiac arrest with 9-minute downtime followed by seizure episode.  Difficult airway was noted due to emesis.  Transferred to ICU.  Cerebellar EEG negative LTM EEG placed overnight  INTERIM HISTORY/SUBJECTIVE  No family at the bedside.  Patient remains intubated and still requiring ventilatory support for respiratory failure.  He is sleepy but arousable and follows commands well and moves all 4 extremities well against gravity.Neurological exam remains the same with right gaze preference dysconjugate gaze, moves all extremities follows commands Vital signs stable.  Patient has been started on primaquine and clindamycin for pneumocystis this coverage by CCM OBJECTIVE  CBC    Component Value Date/Time   WBC 4.9 10/04/2023 0620   RBC 3.60 (L) 10/04/2023 0620   HGB 10.6 (L) 10/04/2023 0620   HCT 33.5 (L) 10/04/2023 0620   PLT 203 10/04/2023 0620   MCV 93.1 10/04/2023 0620   MCH 29.4 10/04/2023 0620   MCHC 31.6 10/04/2023 0620   RDW 15.0 10/04/2023 0620   LYMPHSABS 0.8 09/29/2023 0846   MONOABS 0.7 09/29/2023 0846   EOSABS 0.0 09/29/2023 0846   BASOSABS 0.0 09/29/2023 0846    BMET    Component Value Date/Time   NA 143 10/04/2023 0620   K 5.4 (H) 10/04/2023 0620   CL 109 10/04/2023 0620   CO2 22 10/04/2023 0620   GLUCOSE 268 (H) 10/04/2023 0620   BUN 49 (H) 10/04/2023 0620   CREATININE 1.63 (H) 10/04/2023 0620   CREATININE 1.03 05/22/2020 1128   CALCIUM 9.3 10/04/2023 0620   GFRNONAA 52 (L) 10/04/2023 0620   GFRNONAA 89 05/22/2020 1128    IMAGING past 24 hours No results found.   Vitals:   10/04/23 1430 10/04/23 1500 10/04/23 1518 10/04/23 1530  BP:  123/84 123/82  124/79  Pulse: 65 (!) 59 62 62  Resp: 16 16  16   Temp: 99.1 F (37.3 C) 99.1 F (37.3 C)  99 F (37.2 C)  TempSrc:      SpO2: 100% 100%  99%  Weight:      Height:         PHYSICAL EXAM General: Critically ill in no apparent distress Psych:  Mood and affect appropriate for situation CV: Regular rate and rhythm on monitor Respiratory:  Regular, unlabored respirations mechanical ventilator GI: Abdomen soft and nontender   NEURO:  Intubated, sedated.  Eyes are closed open opens to voice, following commands Cranial Nerves:  II: Pupils equal and reactive III, IV, VI: Midline, tracks bilaterally, blinks bilaterally VII: UTA due to ETT VIII: hearing intact to voice. IX, X: Cough and gag present. ZO:XWRUEAVW shrug 5/5. XII: UTA Motor/sensory: BUE: 4/5, equal grips BLE: 4/5 Coordination: Deferred Gait- deferred  Most Recent NIH: 8   ASSESSMENT/PLAN  Jeremiah Stevens is a 48 y.o. adult with history of HIV, DM, HTN, HLD, seizures in the setting of severe hyperglycemia, current smoker who presented to ED due to inability to swallow for 5 days, admitted for pnemonia. Neurology consulted 3/19 for dysphagia.  NIH on consult: 3.  Respiratory failure Cardiac arrest  Aspiration pneumonia  Developed hypoxia and respiratory distress leading to cardiac arrest with 9-minute downtime followed by seizure episode.  Difficult airway was noted due to emesis.  Intubated 3/19-> self-extubated 3/21-> re-intubated 3/21 Vent management per CCM Appreciate CCM assistance fentanyl and propofol off Precedex infusion at 0.8 mcg On rocephin and flagyl  Stroke:  right lateral medullary infarct, etiology: small or large vessel disease from uncontrolled stroke risk factors    CT head x 2 no acute finding CTA head & neck no LVO, advanced for age arthrosclerosis L vertebral V4 segment and proximal basilar artery MRI  Acute Right Medullary Infarct. Cytotoxic edema with no  hemorrhage or mass effect. 2D Echo: LVEF 60-65%, Grade I diastolic dysfunction LDL 95 HgbA1c 10.8 UDS positive for THC VTE prophylaxis -Lovenox No antithrombotic prior to admission, now on aspirin 81 mg daily and clopidogrel 75 mg daily.  For 3 months then aspirin alone Therapy recommendations:  Pending Disposition:  pending  Seizure  Post cardiac arrest seizure activity On keppra Now on LTM EEG LTM 3/21 This study is suggestive of mild to moderate diffuse encephalopathy. No seizures or epileptiform discharges were seen throughout the recording.  LTM 3/22 This study is suggestive of mild to moderate diffuse encephalopathy. No seizures or epileptiform discharges were seen throughout the recording. Will DC LTM today  Hx of Hypertension Now hypotensive Home meds:  lopressor Stable Levophed off since 3/20 Avoid hypotension, goal MAP >65 Long term BP goal normotensive  Hyperlipidemia Home meds:  none LDL 95, goal < 70 Add Lipitor 20mg  Continue statin on discharge  Diabetes type II Uncontrolled Home meds: Lantus insulin (previously noted noncompliance) HgbA1c 10.8, goal < 7.0 CBGs SSI Recommend close follow-up with PCP for better DM control  Tobacco Abuse Patient smokes 0.5 packs per day for 26 years      Ready to quit? N/A Nicotine replacement therapy provided  Substance Abuse UDS positive for  THC       Ready to quit? N/A TOC consult for cessation placed  Dysphagia Dysphagia on admission NPO now Intubated with OG tube  On tube feeding @35   Other Stroke Risk Factors Obesity, Body mass index is 30.74 kg/m., BMI >/= 30 associated with increased stroke risk, recommend weight loss, diet and exercise as appropriate  Migraines Hx of stroke on images - MRI revealed small chronic right cerebellar infarcts, likely also right PICA. These were not seen on 08/28/23 MRI  Other Active Problems Hx of HIV, Home Meds: Biktarvy, bactrim and diflucan   Hospital day # 8  Pt  seen by Neuro NP/APP and later by MD. Note/plan to be edited by MD as needed.      Patient remains intubated for respiratory failure.  Continue ventilatory support for respiratory failure as per critical care team.  Continue aspirin and Plavix for 3 months followed by aspirin alone for his intracranial atherosclerosis and stroke.  Repeat trial of extubation in a few days if patient does well.  No family at the bedside.  Discussed with Dr. Everardo All critical care medicine.   This patient is critically ill and at significant risk of neurological worsening, death and care requires constant monitoring of vital signs, hemodynamics,respiratory and cardiac monitoring, extensive review of multiple databases, frequent neurological assessment, discussion with family, other specialists and medical decision making of high complexity.I have made any additions or clarifications directly to the above note.This critical care time does not reflect procedure time, or teaching time or supervisory time of PA/NP/Med Resident etc but could involve care discussion time.  I spent 30 minutes of neurocritical care time  in the care of  this patient.  Delia Heady, MD Medical Director [facility] Pager: [phone] 10/04/2023 4:20 PM

## 2023-10-04 NOTE — Plan of Care (Signed)
 Problem: Health Behavior/Discharge Planning: Goal: Ability to identify and utilize available resources and services will improve Outcome: Not Progressing Goal: Ability to manage health-related needs will improve Outcome: Not Progressing   Problem: Metabolic: Goal: Ability to maintain appropriate glucose levels will improve Outcome: Not Progressing   Problem: Nutritional: Goal: Maintenance of adequate nutrition will improve Outcome: Not Progressing Goal: Progress toward achieving an optimal weight will improve Outcome: Not Progressing   Problem: Skin Integrity: Goal: Risk for impaired skin integrity will decrease Outcome: Progressing   Problem: Tissue Perfusion: Goal: Adequacy of tissue perfusion will improve Outcome: Progressing   Problem: Education: Goal: Knowledge of General Education information will improve Description: Including pain rating scale, medication(s)/side effects and non-pharmacologic comfort measures Outcome: Not Progressing   Problem: Health Behavior/Discharge Planning: Goal: Ability to manage health-related needs will improve Outcome: Not Progressing   Problem: Clinical Measurements: Goal: Ability to maintain clinical measurements within normal limits will improve Outcome: Not Progressing Goal: Will remain free from infection Outcome: Not Progressing Goal: Diagnostic test results will improve Outcome: Not Progressing Goal: Respiratory complications will improve Outcome: Not Progressing Goal: Cardiovascular complication will be avoided Outcome: Not Progressing   Problem: Activity: Goal: Risk for activity intolerance will decrease Outcome: Not Progressing   Problem: Nutrition: Goal: Adequate nutrition will be maintained Outcome: Not Progressing   Problem: Coping: Goal: Level of anxiety will decrease Outcome: Progressing   Problem: Elimination: Goal: Will not experience complications related to bowel motility Outcome: Not Progressing Goal:  Will not experience complications related to urinary retention Outcome: Progressing   Problem: Pain Managment: Goal: General experience of comfort will improve and/or be controlled Outcome: Progressing   Problem: Safety: Goal: Ability to remain free from injury will improve Outcome: Not Progressing   Problem: Skin Integrity: Goal: Risk for impaired skin integrity will decrease Outcome: Progressing   Problem: Activity: Goal: Ability to tolerate increased activity will improve Outcome: Progressing   Problem: Clinical Measurements: Goal: Ability to maintain a body temperature in the normal range will improve Outcome: Not Progressing   Problem: Respiratory: Goal: Ability to maintain adequate ventilation will improve Outcome: Not Progressing Goal: Ability to maintain a clear airway will improve Outcome: Not Progressing   Problem: Education: Goal: Knowledge of disease or condition will improve Outcome: Not Progressing Goal: Knowledge of secondary prevention will improve (MUST DOCUMENT ALL) Outcome: Not Progressing Goal: Knowledge of patient specific risk factors will improve (DELETE if not current risk factor) Outcome: Not Progressing   Problem: Ischemic Stroke/TIA Tissue Perfusion: Goal: Complications of ischemic stroke/TIA will be minimized Outcome: Not Progressing   Problem: Coping: Goal: Will verbalize positive feelings about self Outcome: Not Progressing Goal: Will identify appropriate support needs Outcome: Not Progressing   Problem: Health Behavior/Discharge Planning: Goal: Ability to manage health-related needs will improve Outcome: Not Progressing Goal: Goals will be collaboratively established with patient/family Outcome: Not Progressing   Problem: Self-Care: Goal: Ability to participate in self-care as condition permits will improve Outcome: Not Progressing Goal: Verbalization of feelings and concerns over difficulty with self-care will improve Outcome:  Not Progressing Goal: Ability to communicate needs accurately will improve Outcome: Not Progressing   Problem: Nutrition: Goal: Risk of aspiration will decrease Outcome: Not Progressing Goal: Dietary intake will improve Outcome: Not Progressing   Problem: Activity: Goal: Ability to tolerate increased activity will improve Outcome: Not Progressing   Problem: Respiratory: Goal: Ability to maintain a clear airway and adequate ventilation will improve Outcome: Not Progressing   Problem: Role Relationship: Goal: Method of communication will  improve Outcome: Not Progressing   Problem: Safety: Goal: Non-violent Restraint(s) Outcome: Completed/Met

## 2023-10-04 NOTE — TOC Progression Note (Signed)
 Transition of Care [facility]) - Progression Note    Patient Details  Name: Jeremiah Stevens MRN: [id number] Date of Birth: 05/13/1976  [facility] of [facility] CM/SW Contact  Mearl Latin, LCSW Phone Number: 10/04/2023, 10:05 AM  Clinical Narrative:    TOC continuing to follow. Patient is intubated.    Expected Discharge Plan: Home/Self Care Barriers to Discharge: Continued Medical Work up  Expected Discharge Plan and Services In-house Referral: Clinical Social Work   Post Acute Care Choice: NA Living arrangements for the past 2 months: Single Family Home                 DME Arranged: N/A DME Agency: NA                   Social Determinants of Health (SDOH) Interventions SDOH Screenings   Food Insecurity: No Food Insecurity (09/27/2023)  Housing: Low Risk  (09/27/2023)  Transportation Needs: Unmet Transportation Needs (09/27/2023)  Utilities: Not At Risk (09/27/2023)  Depression (PHQ2-9): Low Risk  (05/22/2020)  Tobacco Use: High Risk (09/26/2023)    Readmission Risk Interventions     No data to display

## 2023-10-04 NOTE — Progress Notes (Signed)
 NAME:  Jeremiah Stevens, MRN:  [id number], DOB:  12-19-1975, LOS: 8 ADMISSION DATE:  09/26/2023, CONSULTATION DATE:  09/29/23 REFERRING MD:  Rennis Chris CHIEF COMPLAINT:  Hypoxia   History of Present Illness:  Pt is encephelopathic; therefore, this HPI is obtained from chart review. Jeremiah Stevens is a 48 y.o. adult who has a PMH as below including but not limited to DM2, seizures, HIV AIDS untreated, cryptococcal disease, obesity, polysubstance abuse, prior CVA. She was admitted 09/26/23 with dysphagia x 3 days along with headaches and drooling. He was found to be hypoxic to 80s on room air.  CTA was negative for PE but showed multifocal PNA. He was started on abx and BD's. Biktarvy was continued and ID was consulted.   3/18 He had right facial droop, right sided weakness, dysarthria. Neurology was consulted and recommended CTA and MRI. MRI demonstrated acute right medullary infarct.  Despite ongoing dysphagia (MBS showed moderately severe pharyngeal phase dysphagia and imaging showed unilateral bulging on right), pt insisted on eating. He reportedly ordered pizza overnight 3/18 and certainly aspirated. He had threatened to leave AMA despite stroke workup ongoing.  3/19, he had hypoxia for which rapid response was called. While being assess, he continued to desaturate down into the 60s and then 40s. He required BVM and was transferred to the ICU for further evaluation and management. Upon arrival in the ICU, she had ongoing desaturations and bradycardia before developing PEA arrest.  She had 9 minutes ACLS prior to ROSC including epi x 4 and bicarb x 2. During intubation attempt, she had copious food material and emesis in her oropharynx. Visualization of vocal cords was difficult due to amount of food material and emesis despite suctioning. On 2nd attempt, ETT was passed successfully and saturations improved shortly thereafter. There was grand mal seizure activity noted after intubation that  abated after Midazolam administration.  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[facility]); Non-suicidal depressed mood; Gender dysphoria; RLL pneumonia; AKI (acute kidney injury) (HCC); Uncontrolled type 2 diabetes mellitus with hyperglycemia, without long-term current use of insulin (HCC); Tobacco abuse; HIV infection (HCC); Positive RPR test; Hyperosmolar hyperglycemic state (HHS) (HCC); Seizure (HCC); Acute hypoxic respiratory failure (HCC); Acute encephalopathy; Hyperammonemia (HCC); Pancytopenia (HCC); Cryptococcosis (HCC); CAP (community acquired pneumonia); Globus sensation; Aspiration pneumonia of both lungs due to vomit (HCC); Cardiac arrest, cause unspecified (HCC); Cerebrovascular accident (CVA) (HCC); and Seizures (HCC) on their problem list.  Significant Hospital Events: Including procedures, antibiotic start and stop dates in addition to other pertinent events   3/16 admit 3/18 neuro consult, found to have acute CVA 3/19 Ongoing aspiration despite obvious dysphagia, was planning to leave AMA but sister talked pt into staying. then had a massive aspiration event leading to worse hypoxia and PEA arrest. Txf to [facility] 3/20 PSV cumulative total of 5-6 hrs, following some commands/ purposefully 3/21 self extubated, was reintubated 3/23 Failed SBT due to WOB  Interim History / Subjective:   Hypertensive overnight requiring initiation of scheduled home diltiazem   Objective:  Blood pressure (!) 144/96, pulse 74, temperature 98.2 F (36.8 C), resp. rate 16, height 6' (1.829 m), weight 102.8 kg, SpO2 99%.    Vent Mode: PRVC FiO2 (%):  [40 %] 40 % Set Rate:  [16 bmp] 16 bmp Vt Set:  [550 mL] 550 mL PEEP:  [8 cmH20] 8 cmH20 Plateau Pressure:  [20 cmH20-23 cmH20] 21 cmH20   Intake/Output Summary (  Last 24 hours) at 10/04/2023 0819 Last data filed at 10/04/2023 0800 Gross per 24  hour  Intake 2517.2 ml  Output 2225 ml  Net 292.2 ml   Filed Weights   10/01/23 0500 10/03/23 0500 10/04/23 0342  Weight: 107.3 kg 116.3 kg 102.8 kg   Physical Exam: General: Chronically ill-appearing, sedated HENT: [place], AT, ETT in place Eyes: EOMI, no scleral icterus Respiratory: Clear to auscultation bilaterally.  No crackles, wheezing or rales Cardiovascular: RRR, -M/R/G, no JVD GI: BS+, soft, nontender Extremities:-Edema,-tenderness Neuro: Drowsy, pupils 2-3 mm, sluggishly reactive, CNII-XII grossly intact GU: External foley in place   K 5.4 BUN/CR 49/1.63, similar compared to yesterday   Labs/imaging personally reviewed:  CTA chest 3/16 > no PE, multifocal PNA. CT head 3/18 > neg. MRI brain 3/19 > acute right medullary infarct, cytotoxic edema with no hemorrhage or mass effect, small chronic right cerebellar infarcts. CTA head/neck 3/19 > no LVO, advanced for age atherosclerosis of L vertebral artery and prox basilar artery, possible mod L V4 stenosis.   Assessment & Plan:   PEA arrest Sz like activity  -reportedly purposeful movements 3/20  P -cEEG per neuro, stopped 3/22> no seizures noted -keppra loaded 3/19, now on BID keppra 500mg   -neuro following -supportive care post arrest, avoid fevers   Acute hypoxic respiratory failure Multifocal PNA, CAP  Possible pneumocystis pneumonia Aspiration event Dysphagia  Tobacco use  P - Full vent support - LTVV, 4-8cc/kg IBW with goal Pplat<30 and DP<15 - precedex and PRN fentanyl for sedation - SBT/WUA as tolerated - VAP, pulm hygiene - F/u Pneumocystis smear and PCR from tracheal aspirate - prn nebs - nicotine patch  - 3/23: 40mg  solumedrol IV BID and primaquine + clindamycin for pneumocystis coverage (avoiding bactrim due to renal function)  Acute R medullary infarct, cytotoxic edema Hx small R cerebellar infarcts, R PICA territory infarct  Encephalopathy, multifactorial P - Stroke following, cont ASA,  plavix, statin - secondary w/u per stroke service  - PT/ OT  HFpEF Hx HTN HLD P - cont to monitor hemodynamics, pending dex response, consider add back pta meds - statin, as above   AKI  - stable - Monitor UOP/Cr - hemodynamic support - cont foley, strict I/Os, avoid nephrotoxins  Normocytic anemia - trend CBC  HIV / AIDS Hx +cryptococcal antigen  VL 3410 CD4 36 P - ID following - descovy per tube - fluconazole 400mg  daily - F/u PCP smear and PCR - Primaquine and clindamycin for PCP coverage  DM2, with hyperglycemia P - Resistant SSI - semglee 30 units BID - 8 units q4hr TF coverage  Anemia, normocytic, mild - monitor  Hx provoked sz (hyperglycemia/ hypoxemia)  Seizure like activity following PEA arrest 3/19  P - AEDs as above  Hyperthyroidism r/o  -TSH low T3 normal, T4 normal   THC use  - cessation counseling when able  Code status - iPAL note 3/19 - DNR status - 3/21 patient self extubated and family reversed to full code - Will need to readdress code status when patient ready for extubation  Best practice (evaluated daily):  Diet/type: NPO; TF  DVT prophylaxis: LMWH Pressure ulcer(s): pressure ulcer assessment deferred  GI prophylaxis: PPI Lines: N/A Foley:  Yes, and it is still needed Code Status:  full code Last date of multidisciplinary goals of care discussion: 3/19 IPAL.   The patient is critically ill with multiple organ systems failure and requires high complexity decision making for assessment and support, frequent evaluation and titration  of therapies, application of advanced monitoring technologies and extensive interpretation of multiple databases.  Independent Critical Care Time: 35 Minutes.   Mechele Collin, M.D. [facility] Pulmonary/Critical Care Medicine 10/04/2023 8:19 AM   Please see Amion for pager number to reach on-call Pulmonary and Critical Care Team.

## 2023-10-04 NOTE — Progress Notes (Signed)
 Physical Therapy Treatment Patient Details Name: Jeremiah Stevens MRN: [id number] DOB: [birth year] Today's Date: 10/04/2023   History of Present Illness 48 yo pt initially admitted to [facility] on for difficulty swallowing. Rapid response on 3/19 with PEA, ROSC after 2 min, then bradycardic arrest 1 min later, CPR resumed, pt intubated, and ROSC 6 min later. Seizure-like activity also noted briefly after ROSC. Pt transferred to [facility] 3/20. 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 self extubated 3/21 reintubated PMH includes: HIV/AIDS, HTN, herpes, migraine, depression, DM II, tobacco use, and seizure.    PT Comments  The pt was agreeable to session, continues to demo great participation in therapy sessions, following all commands, and communicating with mix of nods and writing. The pt was able to assist with moving LE and trunk to sitting EOB with modA of 2. She maintained sitting balance with CGA-modA and cues to correct R lateral lean. The pt will continue to benefit from skilled PT acutely, recommendations remain appropriate at this time.    If plan is discharge home, recommend the following: A little help with walking and/or transfers;A little help with bathing/dressing/bathroom;Assistance with cooking/housework;Direct supervision/assist for medications management;Direct supervision/assist for financial management;Assist for transportation;Help with stairs or ramp for entrance;Supervision due to cognitive status   Can travel by private vehicle        Equipment Recommendations  Other (comment) (defer until gait training completed)    Recommendations for Other Services       Precautions / Restrictions Precautions Precautions: Fall Recall of Precautions/Restrictions: Impaired Precaution/Restrictions Comments: ETT, hand mittens, cortrak, primafit Restrictions Weight Bearing Restrictions Per Provider Order: No     Mobility  Bed Mobility Overal bed mobility:  Needs Assistance Bed Mobility: Rolling, Supine to Sit, Sit to Supine Rolling: Contact guard assist   Supine to sit: +2 for physical assistance, Mod assist, HOB elevated Sit to supine: Mod assist, HOB elevated   General bed mobility comments: pt able to bring BLE to EOB with 1 step commands. pt once BLE off eob asked to place R UE in lap and able to initiate.pt pushing with L UE and requires (A) to manage ETT tubing at this time. pt static sitting with lateral R lean. Pt returning to supine requires (A) with trunk and BIL onto bed surface. pt able to push with bil LE in bridge like position toward [facility]    Transfers                   General transfer comment: deferred      Modified Rankin (Stroke Patients Only) Modified Rankin (Stroke Patients Only) Pre-Morbid Rankin Score: Moderate disability Modified Rankin: Severe disability     Balance Overall balance assessment: Needs assistance Sitting-balance support: Single extremity supported, Bilateral upper extremity supported, Feet supported Sitting balance-Leahy Scale: Poor Sitting balance - Comments: leaning to R, needing cues and at times facilitation to reach midline, pt able to maintain Postural control: Right lateral lean                                  Communication Communication Communication: Impaired Factors Affecting Communication: Trach/intubated (pt is able to write)  Cognition Arousal: Alert Behavior During Therapy: WFL for tasks assessed/performed   PT - Cognitive impairments: Difficult to assess Difficult to assess due to: Intubated  PT - Cognition Comments: pt following all commands, nodding appropriately and answering orientation questions with yes/no questions. pt did pull  ETT partially out during session despite restraints, poor insight to deficits/safety Following commands: Intact      Cueing Cueing Techniques: Other (comments)  Exercises Other  Exercises Other Exercises: scapula retraction, shrug and elbow extension 15 reps sitting    General Comments General comments (skin integrity, edema, etc.): PRV Peep 8 40% vent, BP supine 163/102 HR 77 and sitting BP 132/92      Pertinent Vitals/Pain Pain Assessment Pain Assessment: No/denies pain    Home Living Family/patient expects to be discharged to:: Private residence Living Arrangements: Other relatives Available Help at Discharge: Family;Available PRN/intermittently Type of Home: House Home Access: Stairs to enter   [employer] of Steps: 3   Home Layout: One level Home Equipment: None Additional Comments: info from prior chart    Prior Function            PT Goals (current goals can now be found in the care plan section) Acute Rehab PT Goals Patient Stated Goal: to return home PT Goal Formulation: With patient Time For Goal Achievement: 10/15/23 Potential to Achieve Goals: Good Progress towards PT goals: Progressing toward goals    Frequency    Min 3X/week           Co-evaluation PT/OT/SLP Co-Evaluation/Treatment: Yes Reason for Co-Treatment: For patient/therapist safety;To address functional/ADL transfers;Complexity of the patient's impairments (multi-system involvement) PT goals addressed during session: Mobility/safety with mobility;Balance;Strengthening/ROM OT goals addressed during session: ADL's and self-care;Proper use of Adaptive equipment and DME;Strengthening/ROM      AM-PAC PT "6 Clicks" Mobility   Outcome Measure  Help needed turning from your back to your side while in a flat bed without using bedrails?: A Lot Help needed moving from lying on your back to sitting on the side of a flat bed without using bedrails?: A Lot Help needed moving to and from a bed to a chair (including a wheelchair)?: Total Help needed standing up from a chair using your arms (e.g., wheelchair or bedside chair)?: Total Help needed to walk in hospital  room?: Total Help needed climbing 3-5 steps with a railing? : Total 6 Click Score: 8    End of Session         PT Visit Diagnosis: Unsteadiness on feet (R26.81);Hemiplegia and hemiparesis;Muscle weakness (generalized) (M62.81) Hemiplegia - Right/Left: Right Hemiplegia - dominant/non-dominant: Non-dominant Hemiplegia - caused by: Cerebral infarction     Time: 1029-1059 PT Time Calculation (min) (ACUTE ONLY): 30 min  Charges:    $Therapeutic Exercise: 8-22 mins PT General Charges $$ ACUTE PT VISIT: 1 Visit                     Vickki Muff, PT, DPT   Acute Rehabilitation Department Office [phone] Secure Chat Communication Preferred   Ronnie Derby 10/04/2023, 1:12 PM

## 2023-10-04 NOTE — Plan of Care (Signed)
  Problem: Health Behavior/Discharge Planning: Goal: Ability to identify and utilize available resources and services will improve Outcome: Progressing Goal: Ability to manage health-related needs will improve Outcome: Progressing   Problem: Metabolic: Goal: Ability to maintain appropriate glucose levels will improve Outcome: Progressing   Problem: Nutritional: Goal: Maintenance of adequate nutrition will improve Outcome: Progressing   

## 2023-10-04 NOTE — Evaluation (Signed)
 Occupational Therapy reevaluation Patient Details Name: Jeremiah Stevens MRN: [id number] DOB: 1975-09-08 Today's Date: 10/04/2023   History of Present Illness   48 yo pt initially admitted to [facility] on for difficulty swallowing. Rapid response on 3/19 with PEA, ROSC after 2 min, then bradycardic arrest 1 min later, CPR resumed, pt intubated, and ROSC 6 min later. Seizure-like activity also noted briefly after ROSC. Pt transferred to [facility] 3/20. 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 self extubated 3/21 reintubated PMH includes: HIV/AIDS, HTN, herpes, migraine, depression, DM II, tobacco use, and seizure.     Clinical Impressions PT admitted with CVA and PEA transfer from [facility]. Pt currently with functional limitiations due to the deficits listed below (see OT problem list). Pt prior to admission to [facility] was indep with all adls. Pt at this time requires (A) with mobility and R side weakness. Pt is able to communicate with writing L hand while on vent.  Pt will benefit from skilled OT to increase their independence and safety with adls and balance to allow discharge Patient will benefit from intensive inpatient follow-up therapy, >3 hours/day .      If plan is discharge home, recommend the following:   Two people to help with walking and/or transfers;Two people to help with bathing/dressing/bathroom     Functional Status Assessment   Patient has had a recent decline in their functional status and demonstrates the ability to make significant improvements in function in a reasonable and predictable amount of time.     Equipment Recommendations   Wheelchair (measurements OT);Wheelchair cushion (measurements OT);BSC/3in1;Hospital bed     Recommendations for Other Services   Rehab consult;Speech consult     Precautions/Restrictions   Precautions Precautions: Fall Recall of Precautions/Restrictions: Impaired Precaution/Restrictions Comments: ETT,  hand mittens, cortrak,primafit     Mobility Bed Mobility Overal bed mobility: Needs Assistance Bed Mobility: Rolling, Supine to Sit, Sit to Supine Rolling: Contact guard assist   Supine to sit: +2 for physical assistance, Mod assist, HOB elevated Sit to supine: Mod assist, HOB elevated   General bed mobility comments: pt able to bring BLE to EOB with 1 step commands. pt once BLE off eob asked to place R UE in lap and able to initiate.pt pushing with L UE and requires (A) to manage ETT tubing at this time. pt static sitting with lateral R lean. Pt returning to supine requires (A) with trunk and BIL onto bed surface. pt able to push with bil LE in bridge like position toward [facility]    Transfers                   General transfer comment: deferred      Balance Overall balance assessment: Needs assistance Sitting-balance support: Single extremity supported, Bilateral upper extremity supported, Feet supported Sitting balance-Leahy Scale: Poor   Postural control: Right lateral lean                                 ADL either performed or assessed with clinical judgement   ADL Overall ADL's : Needs assistance/impaired Eating/Feeding: NPO   Grooming: Wash/dry face;Minimal assistance;Sitting   Upper Body Bathing: Moderate assistance;Sitting   Lower Body Bathing: Maximal assistance                               Vision   Vision Assessment?: No  apparent visual deficits     Perception         Praxis         Pertinent Vitals/Pain Pain Assessment Pain Assessment: No/denies pain     Extremity/Trunk Assessment Upper Extremity Assessment Upper Extremity Assessment: Left hand dominant;RUE deficits/detail RUE Deficits / Details: 3 out 5 shoulder flexion, 3+ /5 grasp elbow flexion RUE Coordination: decreased gross motor;decreased fine motor   Lower Extremity Assessment Lower Extremity Assessment: Defer to PT evaluation   Cervical / Trunk  Assessment Cervical / Trunk Assessment: Normal   Communication Communication Communication: Impaired Factors Affecting Communication: Trach/intubated   Cognition Arousal: Alert Behavior During Therapy: WFL for tasks assessed/performed Cognition: Difficult to assess Difficult to assess due to: Intubated Orientation impairments:  (writing march when asked) Awareness: Intellectual awareness intact, Online awareness impaired       OT - Cognition Comments: pt writing with L hand and communicating correct orientation questions. pt asking for pink oral swab for mouth. pt asking for mitten to be removed                 Following commands: Intact       Cueing  General Comments   Cueing Techniques: Other (comments)  PRV Peep 8 40% vent, BP supine 163/102 HR 77 and sitting BP 132/92   Exercises Exercises: Other exercises Other Exercises Other Exercises: scapula retraction, shrug and elbow extension 15 reps sitting   Shoulder Instructions      Home Living Family/patient expects to be discharged to:: Private residence Living Arrangements: Other relatives Available Help at Discharge: Family;Available PRN/intermittently Type of Home: House Home Access: Stairs to enter [employer] of Steps: 3   Home Layout: One level     Bathroom Shower/Tub: Curtain   Teacher, early years/pre: No   Home Equipment: None   Additional Comments: info from prior chart      Prior Functioning/Environment Prior Level of Function : Independent/Modified Independent                    OT Problem List: Impaired balance (sitting and/or standing);Decreased cognition;Decreased safety awareness;Cardiopulmonary status limiting activity;Decreased knowledge of use of DME or AE;Decreased knowledge of precautions;Obesity   OT Treatment/Interventions: Self-care/ADL training;Therapeutic exercise;Neuromuscular education;Energy conservation;DME and/or AE  instruction;Therapeutic activities;Cognitive remediation/compensation;Patient/family education;Balance training      OT Goals(Current goals can be found in the care plan section)   Acute Rehab OT Goals Patient Stated Goal: to sit up OT Goal Formulation: With patient Time For Goal Achievement: 10/18/23 Potential to Achieve Goals: Good   OT Frequency:  Min 2X/week    Co-evaluation PT/OT/SLP Co-Evaluation/Treatment: Yes Reason for Co-Treatment: For patient/therapist safety;To address functional/ADL transfers;Complexity of the patient's impairments (multi-system involvement)   OT goals addressed during session: ADL's and self-care;Proper use of Adaptive equipment and DME;Strengthening/ROM      AM-PAC OT "6 Clicks" Daily Activity     Outcome Measure Help from another person eating meals?: A Lot Help from another person taking care of personal grooming?: A Lot Help from another person toileting, which includes using toliet, bedpan, or urinal?: A Lot Help from another person bathing (including washing, rinsing, drying)?: A Lot Help from another person to put on and taking off regular upper body clothing?: A Lot Help from another person to put on and taking off regular lower body clothing?: Total 6 Click Score: 11   End of Session Equipment Utilized During Treatment: Oxygen Nurse Communication: Need for lift equipment;Precautions;Mobility status  Activity Tolerance: Patient  tolerated treatment well Patient left: in bed;with call bell/phone within reach;with bed alarm set;Other (comment) (restraints off as pt asked RN and Rn agreed during session)  OT Visit Diagnosis: Unsteadiness on feet (R26.81);Muscle weakness (generalized) (M62.81) Symptoms and signs involving cognitive functions: Cerebral infarction                Time: 1028-1100 OT Time Calculation (min): 32 min Charges:  OT General Charges $OT Visit: 1 Visit OT Evaluation $OT Re-eval: 1 Re-eval   Brynn, OTR/L  Acute  Rehabilitation Services Office: [phone] .   Mateo Flow 10/04/2023, 11:18 AM

## 2023-10-04 NOTE — Progress Notes (Signed)
 0730 patient wakes to voice and touch follows commands 0900 failed wean apneic deceasing sedation more 1100 patient placed on another wean not pulling any volumes sedation decreased buy half at this point sat to edge of bed with therapy tolerated well mitts taken off patient able to write needs

## 2023-10-05 DIAGNOSIS — J189 Pneumonia, unspecified organism: Secondary | ICD-10-CM | POA: Diagnosis not present

## 2023-10-05 DIAGNOSIS — R1310 Dysphagia, unspecified: Secondary | ICD-10-CM | POA: Diagnosis not present

## 2023-10-05 DIAGNOSIS — B59 Pneumocystosis: Secondary | ICD-10-CM

## 2023-10-05 DIAGNOSIS — J9690 Respiratory failure, unspecified, unspecified whether with hypoxia or hypercapnia: Secondary | ICD-10-CM

## 2023-10-05 DIAGNOSIS — B459 Cryptococcosis, unspecified: Secondary | ICD-10-CM | POA: Diagnosis not present

## 2023-10-05 DIAGNOSIS — I469 Cardiac arrest, cause unspecified: Secondary | ICD-10-CM | POA: Diagnosis not present

## 2023-10-05 DIAGNOSIS — B20 Human immunodeficiency virus [HIV] disease: Secondary | ICD-10-CM | POA: Diagnosis not present

## 2023-10-05 DIAGNOSIS — N179 Acute kidney failure, unspecified: Secondary | ICD-10-CM

## 2023-10-05 DIAGNOSIS — Z9911 Dependence on respirator [ventilator] status: Secondary | ICD-10-CM

## 2023-10-05 DIAGNOSIS — J9601 Acute respiratory failure with hypoxia: Secondary | ICD-10-CM | POA: Diagnosis not present

## 2023-10-05 DIAGNOSIS — I6389 Other cerebral infarction: Secondary | ICD-10-CM | POA: Diagnosis not present

## 2023-10-05 LAB — LACTATE DEHYDROGENASE: LDH: 180 U/L (ref 98–192)

## 2023-10-05 LAB — GLUCOSE, CAPILLARY
Glucose-Capillary: 176 mg/dL — ABNORMAL HIGH (ref 70–99)
Glucose-Capillary: 180 mg/dL — ABNORMAL HIGH (ref 70–99)
Glucose-Capillary: 205 mg/dL — ABNORMAL HIGH (ref 70–99)
Glucose-Capillary: 272 mg/dL — ABNORMAL HIGH (ref 70–99)
Glucose-Capillary: 299 mg/dL — ABNORMAL HIGH (ref 70–99)
Glucose-Capillary: 327 mg/dL — ABNORMAL HIGH (ref 70–99)

## 2023-10-05 LAB — BASIC METABOLIC PANEL
Anion gap: 11 (ref 5–15)
BUN: 53 mg/dL — ABNORMAL HIGH (ref 6–20)
CO2: 23 mmol/L (ref 22–32)
Calcium: 9.5 mg/dL (ref 8.9–10.3)
Chloride: 109 mmol/L (ref 98–111)
Creatinine, Ser: 1.37 mg/dL — ABNORMAL HIGH (ref 0.61–1.24)
GFR, Estimated: >60 mL/min (ref >60)
Glucose, Bld: 263 mg/dL — ABNORMAL HIGH (ref 70–99)
Potassium: 5.4 mmol/L — ABNORMAL HIGH (ref 3.5–5.1)
Sodium: 143 mmol/L (ref 135–145)

## 2023-10-05 LAB — CULTURE, RESPIRATORY W GRAM STAIN

## 2023-10-05 LAB — MAGNESIUM: Magnesium: 2.5 mg/dL — ABNORMAL HIGH (ref 1.7–2.4)

## 2023-10-05 MED ORDER — INSULIN ASPART 100 UNIT/ML IJ SOLN
15.0000 [IU] | INTRAMUSCULAR | Status: DC
  Administered 2023-10-05 – 2023-10-06 (×6): 15 [IU] via SUBCUTANEOUS

## 2023-10-05 MED ORDER — DILTIAZEM HCL 60 MG PO TABS
60.0000 mg | ORAL_TABLET | Freq: Four times a day (QID) | ORAL | Status: DC
  Administered 2023-10-05 – 2023-10-11 (×24): 60 mg
  Filled 2023-10-05 (×25): qty 1

## 2023-10-05 MED ORDER — INSULIN GLARGINE-YFGN 100 UNIT/ML ~~LOC~~ SOLN
45.0000 [IU] | Freq: Two times a day (BID) | SUBCUTANEOUS | Status: DC
  Administered 2023-10-05 (×2): 45 [IU] via SUBCUTANEOUS
  Filled 2023-10-05 (×4): qty 0.45

## 2023-10-05 MED ORDER — INSULIN ASPART 100 UNIT/ML IJ SOLN
15.0000 [IU] | Freq: Once | INTRAMUSCULAR | Status: AC
  Administered 2023-10-05: 15 [IU] via SUBCUTANEOUS

## 2023-10-05 NOTE — Progress Notes (Signed)
 "STROKE TEAM PROGRESS NOTE    SIGNIFICANT HOSPITAL EVENTS  3/16: Admitted for defeat dysphagia, found to have multifocal pneumonia likely due to aspiration. 3/18: Developed right facial droop and right-sided weakness.  MRI shows acute right medullary infarct  3/19: Developed hypoxia and respiratory distress leading to cardiac arrest with 9-minute downtime followed by seizure episode.  Difficult airway was noted due to emesis.  Transferred to ICU.  Cerebellar EEG negative LTM EEG placed overnight  INTERIM HISTORY/SUBJECTIVE  No family at the bedside.  Patient remains intubated and still requiring ventilatory support for respiratory failure.  He is sleepy but arousable and follows commands well and moves all 4 extremities well against gravity.Neurological exam remains the same with right gaze preference dysconjugate gaze, moves all extremities follows commands Vital signs stable.  Patient failed SBT trial yesterday due to increased work of breathing will reattempt today and wean Precedex OBJECTIVE  CBC    Component Value Date/Time   WBC 4.9 10/04/2023 0620   RBC 3.60 (L) 10/04/2023 0620   HGB 10.6 (L) 10/04/2023 0620   HCT 33.5 (L) 10/04/2023 0620   PLT 203 10/04/2023 0620   MCV 93.1 10/04/2023 0620   MCH 29.4 10/04/2023 0620   MCHC 31.6 10/04/2023 0620   RDW 15.0 10/04/2023 0620   LYMPHSABS 0.8 09/29/2023 0846   MONOABS 0.7 09/29/2023 0846   EOSABS 0.0 09/29/2023 0846   BASOSABS 0.0 09/29/2023 0846    BMET    Component Value Date/Time   NA 143 10/05/2023 0843   K 5.4 (H) 10/05/2023 0843   CL 109 10/05/2023 0843   CO2 23 10/05/2023 0843   GLUCOSE 263 (H) 10/05/2023 0843   BUN 53 (H) 10/05/2023 0843   CREATININE 1.37 (H) 10/05/2023 0843   CREATININE 1.03 05/22/2020 1128   CALCIUM 9.5 10/05/2023 0843   GFRNONAA >60 10/05/2023 0843   GFRNONAA 89 05/22/2020 1128    IMAGING past 24 hours No results found.   Vitals:   10/05/23 1100 10/05/23 1200 10/05/23 1300 10/05/23  1400  BP: 127/84 (!) 140/88 108/71 110/87  Pulse:  80  77  Resp: (!) 22 (!) 33 16 16  Temp: 98.4 F (36.9 C) 98.8 F (37.1 C) 99 F (37.2 C) 99.3 F (37.4 C)  TempSrc:      SpO2:  99%  (!) 89%  Weight:      Height:         PHYSICAL EXAM General: Critically ill in no apparent distress Psych:  Mood and affect appropriate for situation CV: Regular rate and rhythm on monitor Respiratory:  Regular, unlabored respirations mechanical ventilator GI: Abdomen soft and nontender   NEURO:  Intubated, sedated.  Eyes are closed open opens to voice, following commands Cranial Nerves:  II: Pupils equal and reactive III, IV, VI: Midline, tracks bilaterally, blinks bilaterally VII: UTA due to ETT VIII: hearing intact to voice. IX, X: Cough and gag present. ZO:[id number] shrug 5/5. XII: UTA Motor/sensory: BUE: 4/5, equal grips BLE: 4/5 Coordination: Deferred Gait- deferred  Most Recent NIH: 8   ASSESSMENT/PLAN  [name] is a 47 y.o. adult with history of HIV, DM, HTN, HLD, seizures in the setting of severe hyperglycemia, current smoker who presented to ED due to inability to swallow for 5 days, admitted for pnemonia. Neurology consulted 3/19 for dysphagia.  NIH on consult: 3.  Respiratory failure Cardiac arrest  Aspiration pneumonia  Developed hypoxia and respiratory distress leading to cardiac arrest with 9-minute downtime followed by seizure episode.  Difficult airway was noted due to emesis.  Intubated 3/19-> self-extubated 3/21-> re-intubated 3/21 Vent management per CCM Appreciate CCM assistance fentanyl and propofol off Precedex infusion at 0.8 mcg On rocephin and flagyl  Stroke:  right lateral medullary infarct, etiology: small or large vessel disease from uncontrolled stroke risk factors    CT head x 2 no acute finding CTA head & neck no LVO, advanced for age arthrosclerosis L vertebral V4 segment and proximal basilar artery MRI  Acute Right Medullary  Infarct. Cytotoxic edema with no hemorrhage or mass effect. 2D Echo: LVEF 60-65%, Grade I diastolic dysfunction LDL 95 HgbA1c 10.8 UDS positive for THC VTE prophylaxis -Lovenox No antithrombotic prior to admission, now on aspirin 81 mg daily and clopidogrel 75 mg daily.  For 3 months then aspirin alone Therapy recommendations:  Pending Disposition:  pending  Seizure  Post cardiac arrest seizure activity On keppra Now on LTM EEG LTM 3/21 This study is suggestive of mild to moderate diffuse encephalopathy. No seizures or epileptiform discharges were seen throughout the recording.  LTM 3/22 This study is suggestive of mild to moderate diffuse encephalopathy. No seizures or epileptiform discharges were seen throughout the recording. Will DC LTM today  Hx of Hypertension Now hypotensive Home meds:  lopressor Stable Levophed off since 3/20 Avoid hypotension, goal MAP >65 Long term BP goal normotensive  Hyperlipidemia Home meds:  none LDL 95, goal < 70 Add Lipitor 20mg  Continue statin on discharge  Diabetes type II Uncontrolled Home meds: Lantus insulin (previously noted noncompliance) HgbA1c 10.8, goal < 7.0 CBGs SSI Recommend close follow-up with PCP for better DM control  Tobacco Abuse Patient smokes 0.5 packs per day for 26 years      Ready to quit? N/A Nicotine replacement therapy provided  Substance Abuse UDS positive for  THC       Ready to quit? N/A TOC consult for cessation placed  Dysphagia Dysphagia on admission NPO now Intubated with OG tube  On tube feeding @35   Other Stroke Risk Factors Obesity, Body mass index is 30.83 kg/m., BMI >/= 30 associated with increased stroke risk, recommend weight loss, diet and exercise as appropriate  Migraines Hx of stroke on images - MRI revealed small chronic right cerebellar infarcts, likely also right PICA. These were not seen on 08/28/23 MRI  Other Active Problems Hx of HIV, Home Meds: Biktarvy, bactrim and  diflucan   Hospital day # 9  Pt seen by Neuro NP/APP and later by MD. Note/plan to be edited by MD as needed.      Patient remains intubated for respiratory failure.  Continue ventilatory support for respiratory failure as per critical care team.  Continue aspirin and Plavix for 3 months followed by aspirin alone for his intracranial atherosclerosis and stroke.  Patient weaning trial of weaning yesterday and today Precedex is being weaned.  He is doing better so far on spontaneous breathing.  Extubate as per critical care team.  No family at the bedside.  Discussed with Dr. Everardo All critical care medicine.     This patient is critically ill and at significant risk of neurological worsening, death and care requires constant monitoring of vital signs, hemodynamics,respiratory and cardiac monitoring, extensive review of multiple databases, frequent neurological assessment, discussion with family, other specialists and medical decision making of high complexity.I have made any additions or clarifications directly to the above note.This critical care time does not reflect procedure time, or teaching time or supervisory time of PA/NP/Med Resident etc but could involve  care discussion time.  I spent 30 minutes of neurocritical care time  in the care of  this patient.           Delia Heady, MD Medical Director [facility] Pager: [phone] 10/05/2023 2:52 PM

## 2023-10-05 NOTE — TOC CAGE-AID Note (Signed)
 Transition of Care [facility]) - CAGE-AID Screening   Patient Details  Name: Jeremiah Stevens MRN: [id number] Date of Birth: 02-02-1976  [facility] of [facility]) CM/SW Contact:    Mearl Latin, LCSW Phone Number: 10/05/2023, 9:14 AM   Clinical Narrative: Patient is currently intubated and unable to participate in screening.    CAGE-AID Screening: Substance Abuse Screening unable to be completed due to: : Patient unable to participate

## 2023-10-05 NOTE — Progress Notes (Addendum)
 I have seen and examined the patient. I have personally reviewed the clinical findings, laboratory findings, microbiological data and imaging studies. The assessment and treatment plan was discussed with the Nurse Practitioner. I agree with her/his recommendations except following additions/corrections.  Afebrile Weaning on precedex and SBT when seen this am Follows commands   Fu pending PJP studies No new recommendations today Standard Isolation precautions   I have personally spent 50 minutes involved in face-to-face and non-face-to-face activities for this patient on the day of the visit. Professional time spent includes the following activities: Preparing to see the patient (review of tests), Obtaining and/or reviewing separately obtained history (admission/discharge record), Performing a medically appropriate examination and/or evaluation , Ordering medications/tests/procedures, referring and communicating with other health care professionals, Documenting clinical information in the EMR, Independently interpreting results (not separately reported), Communicating results to the patient/family/caregiver, Counseling and educating the patient/family/caregiver and Care coordination (not separately reported).     [facility]  Date of Admission:  09/26/2023      Total days of antibiotics 5   ASSESSMENT: Jeremiah Stevens is a 48 y.o. adult admitted with:   Acute CVA 3/18 -  Seizure -  Fever -  [facility]. Fevers picked up on 3/20 and now a little improved since yesterday 3/24. She is following commands and doing much better.  -ctx + met through 3/22 -continue clinda + primaquine + steroids for possible PJP   Hypoxia / Respiratory Failure -  Pneumonia -  PEA Arrest 3/19 -  Care per [facility] team appreciated.  Self extubated and required immediate reintubation for stridor. Looks much better today and hopeful to be extubated  soon. Moves all extremities.  She is being treated for CAP vs aspiration PNA (history of acute CVA and dysphagia recently with hiccoughs) and more recently PJP coverage added with Clindamycin + Primaquine. Pneumocystis smear and PCR pending from respiratory samples. Sputums from 3/17 & 3/20 normal flora  EF 60-65% on Echo 3/19 following arrest. No valvular disease. 2/2 pulmonary event.  -weaning today and looks good - hopeful to extubate -continue steroids + clinda/primaquine for possible PJP  HIV, AIDS - CD4 36 -  Will continue Tivicay + Descovy so can be crushed VT while she is intubated with any dysphagia she may experience 2/2 stroke requiring ongoing cortrak -VL 1.3 million last month >> repeat 3,000 still.  -continue Tivicay + Descovy together crushed VT.   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 Given IV for now.    PCN Allergy (Anaphylaxis) -  Tolerating ceftriaxone fine.   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)   Uncontrolled type 2 diabetes mellitus with hyperglycemia, without long-term current use of insulin (HCC)   Tobacco abuse   Seizure (HCC)   Acute hypoxic respiratory failure (HCC)   Globus sensation   Aspiration pneumonia of both lungs due to vomit [facility])   Cardiac arrest, cause unspecified (HCC)   Cerebrovascular accident (CVA) (HCC)   Seizures (HCC)    aspirin  81 mg Per Tube Daily   atorvastatin  20 mg Per Tube Daily   Chlorhexidine Gluconate Cloth  6 each Topical Daily   clopidogrel  75 mg Per Tube Daily   diltiazem  60 mg Oral Q6H    docusate  100 mg Per Tube BID   dolutegravir  50 mg Per Tube Daily   emtricitabine-tenofovir AF  1 tablet Per Tube Daily   enoxaparin (LOVENOX) injection  50 mg Subcutaneous Q24H   famotidine  20 mg Per Tube Daily   feeding supplement (PROSource TF20)  60 mL Per Tube Daily   insulin aspart  0-20 Units Subcutaneous Q4H   insulin aspart  15 Units Subcutaneous Q4H   insulin glargine-yfgn  45 Units Subcutaneous BID   levalbuterol  0.63 mg Nebulization Q6H   methylPREDNISolone (SOLU-MEDROL) injection  40 mg Intravenous BID   multivitamin with minerals  1 tablet Per Tube Daily   nicotine  14 mg Transdermal Daily   mouth rinse  15 mL Mouth Rinse Q2H   pneumococcal 20-valent conjugate vaccine  0.5 mL Intramuscular Tomorrow-1000   polyethylene glycol  17 g Per Tube BID   primaquine  30 mg Per NG tube Daily   senna  2 tablet Per Tube QHS    SUBJECTIVE: Answers commands. Makes good eye contact. Weaning on ventilator now   Review of Systems: ROS unable to perform with sedation / intubation   Past Medical History:  Diagnosis Date   Cancer (HCC)    seizures   Diabetes (HCC)    Diabetes mellitus without complication (HCC)    Heartburn    occasional; OTC as needed   Herpes genitalis in men    HIV (human immunodeficiency virus infection) (HCC)    HIV disease (HCC)    HTN (hypertension)    Hyperlipidemia    Hypertension    under control with med., has been on med. x 1 yr.   Lateral malleolar fracture 09/02/2013   left   Migraines    Tear of deltoid ligament of left ankle 09/02/2013   Type 2 diabetes mellitus with hyperosmolar nonketotic hyperglycemia (HCC) 02/10/2020   Past Surgical History:  Procedure Laterality Date   NO PAST SURGERIES     ORIF ANKLE FRACTURE Left 09/13/2013   Procedure: OPEN REDUCTION INTERNAL FIXATION (ORIF) LEFT LATERAL MALLEOLUS ANKLE FRACTURE ;  Surgeon: Dannielle Huh, MD;  Location: [facility];  Service: Orthopedics;  Laterality: Left;    Allergies  Allergen Reactions   Penicillins Anaphylaxis   Peanut-Containing Drug Products Other (See Comments)    WALNUTS - SORES ON TONGUE   Sustiva [Efavirenz] Rash    OBJECTIVE: Vitals:   10/05/23 0700 10/05/23 0753 10/05/23 0800 10/05/23 1047  BP: 127/70  121/73   Pulse:  73  70  Resp: 15 19 (!) 29 (!) 24  Temp: 98.8 F (37.1 C)  99.1 F (37.3 C)   TempSrc:   Esophageal   SpO2:   100% 95%  Weight:      Height:       Body mass index is 30.83 kg/m.  Physical Exam Constitutional:      Appearance: She is ill-appearing.  Eyes:     Comments: Eyes closed. EEG leads in place  Cardiovascular:     Rate and Rhythm: Normal rate and regular rhythm.  Pulmonary:     Effort: Pulmonary effort is normal. No respiratory distress.     Breath sounds: Normal breath sounds. No wheezing.  Abdominal:  General: Bowel sounds are normal. There is no distension.     Palpations: Abdomen is soft.  Musculoskeletal:        General: Swelling present.  Skin:    General: Skin is warm.     Lab Results Lab Results  Component Value Date   WBC 4.9 10/04/2023   HGB 10.6 (L) 10/04/2023   HCT 33.5 (L) 10/04/2023   MCV 93.1 10/04/2023   PLT 203 10/04/2023    Lab Results  Component Value Date   CREATININE 1.37 (H) 10/05/2023   BUN 53 (H) 10/05/2023   NA 143 10/05/2023   K 5.4 (H) 10/05/2023   CL 109 10/05/2023   CO2 23 10/05/2023    Lab Results  Component Value Date   ALT 31 09/29/2023   AST 18 09/29/2023   ALKPHOS 71 09/29/2023   BILITOT 0.6 09/29/2023     Microbiology: Recent Results (from the past 240 hours)  Blood culture (routine x 2)     Status: None   Collection Time: 09/26/23  9:01 PM   Specimen: BLOOD  Result Value Ref Range Status   Specimen Description   Final    BLOOD LEFT ANTECUBITAL Performed at [facility], [address], [place], [place] [address]    Special Requests   Final    BOTTLES DRAWN AEROBIC AND ANAEROBIC Blood Culture  adequate volume Performed at [facility], [address], [place], [place] [address]    Culture   Final    NO GROWTH 5 DAYS Performed at [facility], [address], [place], [place] [address]    Report Status 10/01/2023 FINAL  Final  Blood culture (routine x 2)     Status: None   Collection Time: 09/26/23  9:01 PM   Specimen: BLOOD  Result Value Ref Range Status   Specimen Description   Final    BLOOD RIGHT ANTECUBITAL Performed at [facility], [address], [place], [place] [address]    Special Requests   Final    BOTTLES DRAWN AEROBIC AND ANAEROBIC Blood Culture adequate volume Performed at [facility], [address] [place], [place] [address]    Culture   Final    NO GROWTH 5 DAYS Performed at [facility], [address] [place], [place] [address]    Report Status 10/01/2023 FINAL  Final  Resp panel by RT-PCR (RSV, Flu A&B, Covid)     Status: None   Collection Time: 09/26/23  9:01 PM   Specimen: Nasal Swab  Result Value Ref Range Status   SARS Coronavirus 2 by RT PCR NEGATIVE NEGATIVE Final    Comment: (NOTE) SARS-CoV-2 target nucleic acids are NOT DETECTED.  The SARS-CoV-2 RNA is generally detectable in upper respiratory specimens during the acute phase of infection. The lowest concentration of SARS-CoV-2 viral copies this assay can detect is 138 copies/mL. A negative result does not preclude SARS-Cov-2 infection and should not be used as the sole basis for treatment or other patient management decisions. A negative result may occur with  improper specimen collection/handling, submission of specimen other than nasopharyngeal swab, presence of viral mutation(s) within the areas targeted by this assay, and inadequate number of viral copies(<138 copies/mL). A negative result must be combined with clinical observations, patient history, and epidemiological information. The expected result is  Negative.  Fact Sheet for Patients:  BloggerCourse.com  Fact Sheet for Healthcare Providers:  SeriousBroker.it  This test is no t yet  approved or cleared by the [place] and  has been authorized for detection and/or diagnosis of SARS-CoV-2 by FDA under an Emergency Use Authorization (EUA). This EUA will remain  in effect (meaning this test can be used) for the duration of the COVID-19 declaration under Section 564(b)(1) of the Act, 21 U.S.C.section 360bbb-3(b)(1), unless the authorization is terminated  or revoked sooner.       Influenza A by PCR NEGATIVE NEGATIVE Final   Influenza B by PCR NEGATIVE NEGATIVE Final    Comment: (NOTE) The Xpert Xpress SARS-CoV-2/FLU/RSV plus assay is intended as an aid in the diagnosis of influenza from Nasopharyngeal swab specimens and should not be used as a sole basis for treatment. Nasal washings and aspirates are unacceptable for Xpert Xpress SARS-CoV-2/FLU/RSV testing.  Fact Sheet for Patients: BloggerCourse.com  Fact Sheet for Healthcare Providers: SeriousBroker.it  This test is not yet approved or cleared by the [place] FDA and has been authorized for detection and/or diagnosis of SARS-CoV-2 by FDA under an Emergency Use Authorization (EUA). This EUA will remain in effect (meaning this test can be used) for the duration of the COVID-19 declaration under Section 564(b)(1) of the Act, 21 U.S.C. section 360bbb-3(b)(1), unless the authorization is terminated or revoked.     Resp Syncytial Virus by PCR NEGATIVE NEGATIVE Final    Comment: (NOTE) Fact Sheet for Patients: BloggerCourse.com  Fact Sheet for Healthcare Providers: SeriousBroker.it  This test is not yet approved or cleared by the [place] FDA and has been authorized for detection and/or diagnosis of  SARS-CoV-2 by FDA under an Emergency Use Authorization (EUA). This EUA will remain in effect (meaning this test can be used) for the duration of the COVID-19 declaration under Section 564(b)(1) of the Act, 21 U.S.C. section 360bbb-3(b)(1), unless the authorization is terminated or revoked.  Performed at [facility], [address], [place], [place] [address]   MRSA Next Gen by PCR, Nasal     Status: None   Collection Time: 09/27/23  8:10 AM   Specimen: Nasal Mucosa; Nasal Swab  Result Value Ref Range Status   MRSA by PCR Next Gen NOT DETECTED NOT DETECTED Final    Comment: (NOTE) The GeneXpert MRSA Assay (FDA approved for NASAL specimens only), is one component of a comprehensive MRSA colonization surveillance program. It is not intended to diagnose MRSA infection nor to guide or monitor treatment for MRSA infections. Test performance is not FDA approved in patients less than 25 years old. Performed at [facility], [address] [place], [place] [address]   Expectorated Sputum Assessment w Gram Stain, Rflx to Resp Cult     Status: None   Collection Time: 09/27/23  9:04 AM   Specimen: Sputum  Result Value Ref Range Status   Specimen Description SPU  Final   Special Requests Immunocompromised  Final   Sputum evaluation   Final    THIS SPECIMEN IS ACCEPTABLE FOR SPUTUM CULTURE Performed at [facility], [address], [place], [place] [address]    Report Status 09/27/2023 FINAL  Final  Culture, Respiratory w Gram Stain     Status: None   Collection Time: 09/27/23  9:04 AM   Specimen: Sputum  Result Value Ref Range Status   Specimen Description   Final    SPU Performed at [facility], [address] [place], [place] [address]    Special Requests   Final    Immunocompromised Reflexed from [id number]  Performed at [facility], [address], [place], [place] [address]    Gram  Stain   Final    ABUNDANT WBC PRESENT,BOTH PMN AND MONONUCLEAR MODERATE SQUAMOUS EPITHELIAL CELLS PRESENT MODERATE GRAM POSITIVE COCCI IN PAIRS RARE GRAM NEGATIVE RODS FEW GRAM POSITIVE RODS    Culture   Final    FEW Normal respiratory flora-no Staph aureus or Pseudomonas seen Performed at [facility], [address] [place], [place] [address]    Report Status 09/29/2023 FINAL  Final  Culture, Respiratory w Gram Stain     Status: None   Collection Time: 09/29/23  5:08 PM   Specimen: Tracheal Aspirate; Respiratory  Result Value Ref Range Status   Specimen Description   Final    TRACHEAL ASPIRATE Performed at [name], [address], [place], [place] [address]    Special Requests   Final    NONE Performed at [facility], [address] [place], [place] [address]    Gram Stain NO WBC SEEN NO ORGANISMS SEEN   Final   Culture   Final    RARE Normal respiratory flora-no Staph aureus or Pseudomonas seen Performed at [facility], [address] [place] [address], [place], [place] [address]    Report Status 10/02/2023 FINAL  Final  Culture, Respiratory w Gram Stain     Status: None   Collection Time: 09/30/23  1:17 AM   Specimen: Sputum; Respiratory  Result Value Ref Range Status   Specimen Description SPU  Final   Special Requests Immunocompromised  Final   Gram Stain   Final    MODERATE WBC PRESENT, PREDOMINANTLY PMN FEW GRAM POSITIVE COCCI    Culture   Final    Normal respiratory flora-no Staph aureus or Pseudomonas seen Performed at [facility], [address] [place], [place] [address]    Report Status 10/05/2023 FINAL  Final  MRSA Next Gen by PCR, Nasal     Status: None   Collection Time: 09/30/23  1:49 AM   Specimen: Nasal Mucosa; Nasal Swab  Result Value Ref Range Status   MRSA by PCR Next Gen NOT DETECTED NOT DETECTED Final    Comment: (NOTE) The GeneXpert MRSA Assay (FDA approved for NASAL specimens only), is one  component of a comprehensive MRSA colonization surveillance program. It is not intended to diagnose MRSA infection nor to guide or monitor treatment for MRSA infections. Test performance is not FDA approved in patients less than 77 years old. Performed at [facility], [address], [place], [place] [address]    Imaging DG Abd 1 View Result Date: 10/03/2023 CLINICAL DATA:  Enteric tube placement EXAM: ABDOMEN - 1 VIEW COMPARISON:  Abdominal radiograph dated 09/29/2023 FINDINGS: Interval replacement of enteric tube with tip projecting over the distal stomach/proximal duodenum. Nonspecific partially imaged gas-filled bowel loops in the upper abdomen. Partially imaged lower lungs with dense left retrocardiac opacity. IMPRESSION: Interval replacement of enteric tube with tip projecting over the distal stomach/proximal duodenum. Electronically Signed   By: Agustin Cree M.D.   On: 10/03/2023 11:57   DG Chest Port 1 View Result Date: 10/01/2023 CLINICAL DATA:  Intubated EXAM: PORTABLE CHEST 1 VIEW COMPARISON:  09/30/2023, 09/29/2023, 09/26/2023 FINDINGS: Endotracheal tube tip is partially obscured by feeding tube. Tip appears to be about 3.7 cm superior to carina. Enteric tube tip below the diaphragm but incompletely assessed. Cardiomegaly with worsening bibasilar airspace disease. No pleural effusion or pneumothorax. IMPRESSION:  1. Endotracheal tube tip partially obscured by feeding tube. Tip appears to be about 3.7 cm superior to carina. 2. Cardiomegaly with worsening bibasilar airspace disease, this could be due to atelectasis, pneumonia or aspiration Electronically Signed   By: Jasmine Pang M.D.   On: 10/01/2023 16:20   Overnight EEG with video Result Date: 10/01/2023 Charlsie Quest, MD     10/02/2023  7:17 AM Patient Name: Severin Bou MRN: [id number] Epilepsy Attending: Charlsie Quest Referring Physician/Provider: Caryl Pina, MD  Duration: 09/30/2023 4401 to 10/01/2023 0272 Patient  history: 48yo M s/o cardiac arrest with seizure like activity. EEG to evaluate for seizure Level of alertness:  awake, asleep AEDs during EEG study: Propofol, LEV Technical aspects: This EEG study was done with scalp electrodes positioned according to the 10-20 International system of electrode placement. Electrical activity was reviewed with band pass filter of 1-70Hz , sensitivity of 7 uV/mm, display speed of 47mm/sec with a 60Hz  notched filter applied as appropriate. EEG data were recorded continuously and digitally stored.  Video monitoring was available and reviewed as appropriate. Description: The posterior dominant rhythm consists of 8 Hz activity of moderate voltage (25-35 uV) seen predominantly in posterior head regions, symmetric and reactive to eye opening and eye closing. Sleep was characterized by vertex waves, sleep spindles (12 to 14 Hz), maximal frontocentral region.  EEG showed continuous/intermittent generalized polymorphic sharply contoured 3 to 6 Hz theta-delta slowing. Hyperventilation and photic stimulation were not performed.   ABNORMALITY - Intermittent slow, generalized IMPRESSION: This study is suggestive of mild to moderate diffuse encephalopathy. No seizures or epileptiform discharges were seen throughout the recording. Priyanka Annabelle Harman   Rapid EEG Result Date: 09/30/2023 Charlsie Quest, MD     09/30/2023 11:36 AM Patient Name: Fountain Derusha MRN: [id number] Epilepsy Attending: Charlsie Quest Referring Physician/Provider: Kathlene Cote, PA-C Date: 09/29/2023 Duration: 5 hours 54 mins Patient history: 48yo M s/o cardiac arrest with seizure like activity. Rapid EEG to evaluate for seizure Level of alertness:  comatose AEDs during EEG study: Propofol LEV Technical aspects: This EEG was obtained using a 10 lead EEG system positioned circumferentially without any parasagittal coverage (rapid EEG). Computer selected EEG is reviewed as  well as background features and all clinically  significant events. Description: EEG showed continuous generalized 3 to 6 Hz theta-delta slowing. Hyperventilation and photic stimulation were not performed.   ABNORMALITY - Continuous slow, generalized IMPRESSION: This limited ceribell EEG is suggestive of moderate to severe diffuse encephalopathy. No seizures or epileptiform discharges were seen throughout the recording. Charlsie Quest   Portable Chest xray Result Date: 09/30/2023 CLINICAL DATA:  Respiratory failure. EXAM: PORTABLE CHEST 1 VIEW COMPARISON:  September 29, 2023. FINDINGS: Stable cardiomediastinal silhouette. Endotracheal and nasogastric tubes are unchanged in position. Left lung is clear. Minimal right basilar subsegmental atelectasis or infiltrate is noted. Bony thorax is unremarkable. IMPRESSION: Stable support apparatus. Minimal right basilar subsegmental atelectasis or infiltrate is noted. Electronically Signed   By: Lupita Raider M.D.   On: 09/30/2023 10:17   DG Abd 1 View Result Date: 09/29/2023 CLINICAL DATA:  Enteric catheter placement EXAM: ABDOMEN - 1 VIEW COMPARISON:  08/02/2022 FINDINGS: Frontal view of the lower chest and upper abdomen demonstrates enteric catheter passing below diaphragm, tip projecting over the gastric antrum. Nonspecific gaseous distention of the large and small bowel. IMPRESSION: 1. Enteric catheter tip projecting over the gastric antrum. Electronically Signed   By: Sharlet Salina M.D.   On: 09/29/2023 17:30  DG Chest 1 View Result Date: 09/29/2023 CLINICAL DATA:  Intubated EXAM: CHEST  1 VIEW COMPARISON:  09/29/2023 FINDINGS: Single frontal view of the chest demonstrates endotracheal tube overlying tracheal air column, tip 2.2 cm above carina. Enteric catheter passes below diaphragm, tip excluded by collimation. Cardiac silhouette is enlarged but stable. There is increased pulmonary vascular congestion. No effusion or pneumothorax. Chronic elevation the right hemidiaphragm. No acute bony abnormalities.  IMPRESSION: 1. Support devices as above. 2. Enlarged cardiac silhouette, with increased pulmonary vascular congestion. Electronically Signed   By: Sharlet Salina M.D.   On: 09/29/2023 17:28   DG Chest 1 View Result Date: 09/29/2023 CLINICAL DATA:  Shortness of breath. EXAM: CHEST  1 VIEW COMPARISON:  Chest radiograph dated 09/26/2023. FINDINGS: Mild cardiomegaly with mild vascular congestion. No focal consolidation, pleural effusion, pneumothorax. No acute osseous pathology. IMPRESSION: Mild cardiomegaly with mild vascular congestion. Electronically Signed   By: Elgie Collard M.D.   On: 09/29/2023 17:26   ECHOCARDIOGRAM COMPLETE Result Date: 09/29/2023    ECHOCARDIOGRAM REPORT   Patient Name:   JASMEET GEHL Date of Exam: 09/29/2023 Medical Rec #:  [id number]    Height:       72.0 in Accession #:    [id number]   Weight:       255.3 lb Date of Birth:  [birth year]   BSA:          2.363 m Patient Age:    47 years     BP:           151/98 mmHg Patient Gender: M            HR:           112 bpm. Exam Location:  Inpatient Procedure: 2D Echo, Cardiac Doppler and Color Doppler (Both Spectral and Color            Flow Doppler were utilized during procedure). Indications:    Stroke  History:        Patient has prior history of Echocardiogram examinations. Risk                 Factors:Hypertension and Diabetes.  Sonographer:    Lamont Snowball Referring Phys: [id number] [facility]  Sonographer Comments: Image acquisition challenging due to uncooperative patient. IMPRESSIONS  1. Mild intracavitary gradient. Peak velocity 0.88 m/s. Peak gradient 3.1 mmHg. Left ventricular ejection fraction, by estimation, is 60 to 65%. The left ventricle has normal function. The left ventricle has no regional wall motion abnormalities. There is moderate concentric left ventricular hypertrophy. Left ventricular diastolic parameters are consistent with Grade I diastolic dysfunction (impaired relaxation).  2. Right ventricular systolic  function is normal. The right ventricular size is normal. There is normal pulmonary artery systolic pressure.  3. The mitral valve is normal in structure. Trivial mitral valve regurgitation. No evidence of mitral stenosis.  4. The aortic valve is normal in structure. Aortic valve regurgitation is not visualized. No aortic stenosis is present.  5. The inferior vena cava is normal in size with greater than 50% respiratory variability, suggesting right atrial pressure of 3 mmHg. FINDINGS  Left Ventricle: Mild intracavitary gradient. Peak velocity 0.88 m/s. Peak gradient 3.1 mmHg. Left ventricular ejection fraction, by estimation, is 60 to 65%. The left ventricle has normal function. The left ventricle has no regional wall motion abnormalities. The left ventricular internal cavity size was normal in size. There is moderate concentric left ventricular hypertrophy. Left ventricular diastolic parameters are consistent with Grade I diastolic  dysfunction (impaired relaxation). Indeterminate filling pressures. Right Ventricle: The right ventricular size is normal. No increase in right ventricular wall thickness. Right ventricular systolic function is normal. There is normal pulmonary artery systolic pressure. The tricuspid regurgitant velocity is 1.94 m/s, and  with an assumed right atrial pressure of 3 mmHg, the estimated right ventricular systolic pressure is 18.1 mmHg. Left Atrium: Left atrial size was normal in size. Right Atrium: Right atrial size was normal in size. Pericardium: There is no evidence of pericardial effusion. Mitral Valve: The mitral valve is normal in structure. Trivial mitral valve regurgitation. No evidence of mitral valve stenosis. MV peak gradient, 6.4 mmHg. The mean mitral valve gradient is 3.0 mmHg. Tricuspid Valve: The tricuspid valve is normal in structure. Tricuspid valve regurgitation is trivial. No evidence of tricuspid stenosis. Aortic Valve: The aortic valve is normal in structure. Aortic  valve regurgitation is not visualized. No aortic stenosis is present. Aortic valve peak gradient measures 18.5 mmHg. Pulmonic Valve: The pulmonic valve was normal in structure. Pulmonic valve regurgitation is not visualized. No evidence of pulmonic stenosis. Aorta: The aortic root is normal in size and structure. Venous: The inferior vena cava is normal in size with greater than 50% respiratory variability, suggesting right atrial pressure of 3 mmHg. IAS/Shunts: No atrial level shunt detected by color flow Doppler.  LEFT VENTRICLE PLAX 2D LVIDd:         4.50 cm   Diastology LVIDs:         3.00 cm   LV e' medial:    8.27 cm/s LV PW:         1.50 cm   LV E/e' medial:  12.7 LV IVS:        1.40 cm   LV e' lateral:   7.72 cm/s LVOT diam:     2.20 cm   LV E/e' lateral: 13.6 LV SV:         82 LV SV Index:   35 LVOT Area:     3.80 cm  RIGHT VENTRICLE             IVC RV Basal diam:  4.40 cm     IVC diam: 1.40 cm RV S prime:     25.90 cm/s TAPSE (M-mode): 2.8 cm LEFT ATRIUM             Index        RIGHT ATRIUM           Index LA Vol (A2C):   65.4 ml 27.67 ml/m  RA Area:     18.80 cm LA Vol (A4C):   54.6 ml 23.10 ml/m  RA Volume:   52.90 ml  22.38 ml/m LA Biplane Vol: 60.0 ml 25.39 ml/m  AORTIC VALVE AV Area (Vmax): 2.51 cm AV Vmax:        215.00 cm/s AV Peak Grad:   18.5 mmHg LVOT Vmax:      142.00 cm/s LVOT Vmean:     97.900 cm/s LVOT VTI:       0.217 m  AORTA Ao Root diam: 3.20 cm Ao Asc diam:  3.80 cm MITRAL VALVE                TRICUSPID VALVE MV Area (PHT): 3.55 cm     TR Peak grad:   15.1 mmHg MV Area VTI:   3.27 cm     TR Vmax:        194.00 cm/s MV Peak grad:  6.4 mmHg MV Mean grad:  3.0 mmHg     SHUNTS MV Vmax:       1.26 m/s     Systemic VTI:  0.22 m MV Vmean:      83.0 cm/s    Systemic Diam: 2.20 cm MV E velocity: 105.00 cm/s MV A velocity: 93.80 cm/s MV E/A ratio:  1.12 Chilton Si MD Electronically signed by Chilton Si MD Signature Date/Time: 09/29/2023/4:21:51 PM    Final    CT ANGIO HEAD  NECK W WO CM Result Date: 09/29/2023 CLINICAL DATA:  48 year old male with dysphagia and right lateral medullary infarct on MRI. EXAM: CT ANGIOGRAPHY HEAD AND NECK WITH AND WITHOUT CONTRAST TECHNIQUE: Multidetector CT imaging of the head and neck was performed using the standard protocol during bolus administration of intravenous contrast. Multiplanar CT image reconstructions and MIPs were obtained to evaluate the vascular anatomy. Carotid stenosis measurements (when applicable) are obtained utilizing NASCET criteria, using the distal internal carotid diameter as the denominator. RADIATION DOSE REDUCTION: This exam was performed according to the departmental dose-optimization program which includes automated exposure control, adjustment of the mA and/or kV according to patient size and/or use of iterative reconstruction technique. CONTRAST:  75mL OMNIPAQUE IOHEXOL 350 MG/ML SOLN COMPARISON:  Brain MRI this morning 0115 hours.  Head CT yesterday. FINDINGS: CT HEAD Brain: Intermittent motion artifact. The right medullary infarct is not apparent by CT. No acute intracranial hemorrhage identified. No intracranial mass effect or ventriculomegaly. Calvarium and skull base: Stable. Paranasal sinuses: Visualized paranasal sinuses and mastoids are stable and well aerated. Orbits: No acute orbit or scalp soft tissue finding identified. CTA NECK Skeleton: Carious dentition. No acute osseous abnormality identified. Upper chest: Negative, mild respiratory motion. Other neck: Intermittent motion artifact. Neck soft tissue spaces appear within normal limits. Aortic arch: 3 vessel arch.  Suboptimal aortic arch contrast. Right carotid system: Suboptimal contrast and mild motion artifact but no evidence of plaque or stenosis. Left carotid system: Similar to the opposite side, no evidence of plaque or stenosis. Vertebral arteries: Suboptimal vertebral artery contrast in the neck. The left vertebral artery appears to be dominant. Both  distal vertebral arteries are patent. But there is limited detail of the proximal vertebral arteries, especially the non dominant right. CTA HEAD Better although suboptimal intracranial arterial contrast. Posterior circulation: Dominant distal left vertebral artery with bulky calcified plaque remains patent although moderate distal V4 stenosis is possible on series 12, image 168. The non dominant right vertebral artery is patent, but detail of the right vertebrobasilar junction is limited. The basilar artery is tortuous and patent with calcified plaque proximally but no definite basilar stenosis. Basilar tip, SCA and PCA origins appear patent. Posterior communicating arteries are diminutive or absent. Grossly normal bilateral PCA branches. Anterior circulation: Both ICA siphons are patent, although detail is limited. Patent carotid termini, MCA and ACA origins. MCA M1 segments and bifurcations appear patent. ACA A2 segments appear within normal limits. Grossly normal visible MCA branches. Venous sinuses: Grossly patent. Anatomic variants: Dominant left vertebral artery. Review of the MIP images confirms the above findings IMPRESSION: 1. Suboptimal arterial contrast bolus and intermittent motion artifact. No large vessel occlusion identified. 2. Positive for advanced for age atherosclerosis of the Dominant Left Vertebral Artery V4 segment and proximal Basilar artery. Possible moderate Left V4 stenosis. 3. Limited detail of the non-dominant right vertebral artery, but seems to remain patent to the Basilar. 4. Limited ICA siphon and circle-of-Willis branch detail. Electronically Signed   By: Odessa Fleming M.D.   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 EXAM: MRI HEAD WITHOUT CONTRAST TECHNIQUE: Multiplanar, multiecho pulse sequences of the brain and surrounding structures were obtained without intravenous  contrast. COMPARISON:  Head CT yesterday, and earlier.  Brain MRI 08/27/2023. FINDINGS: Brain: Positive for restricted diffusion in the right lateral medulla near the level of the medullary pyramids series 5, image 8, series 8, image 16. Mild T2 and FLAIR hyperintensity associated with no hemorrhage or mass effect. No other convincing diffusion restriction. But there are small chronic right cerebellar infarcts on the February MRI there. Supratentorial chronic cystic encephalomalacia along the left aspect of the genu of the corpus callosum. And scattered additional periventricular, central and subcortical white matter T2 and FLAIR hyperintensity which was better demonstrated in February. No convincing chronic cerebral blood products on SWI. And deep gray nuclei relatively spared. Vascular: Major intracranial vascular flow voids are stable. Generalized intracranial artery tortuosity, most pronounced in the dominant distal left vertebral artery, basilar. Skull and upper cervical spine: Stable, negative. Sinuses/Orbits: Negative orbits.  Improved paranasal sinus aeration. Other: Mastoids are clear. IMPRESSION: 1. Positive for Acute Right Medullary Infarct. Cytotoxic edema with no hemorrhage or mass effect. 2. Underlying small chronic right cerebellar infarcts, likely also the right PICA territory. And other stable chronic cerebral white matter disease, generalized intracranial artery tortuosity. Electronically Signed   By: Odessa Fleming M.D.   On: 09/29/2023 05:34   DG Swallowing Func-Speech Pathology Result Date: 09/28/2023 Table formatting from the original result was not included. Images from the original result were not included. Modified Barium Swallow Study Patient Details Name: Jawara Latorre MRN: [id number] Date of Birth: June 23, 1976 Today's Date: 09/28/2023 HPI/PMH: HPI: Patient is a 48 y.o. male with PMH: DM-2, seizure disorder, HIV (untreated), obesity, polysubstance abuse, h/o cryptococcal disease. He  presented to the [facility] ED on 09/26/23 with c/o of inability to swallow for the past three days with HA, hiccups and drooling. He was tachypneic in ED on RA and placed on oxygen via nasal cannula. DG neck soft tissue was negative for retropharyngeal soft tissue swelling or epiglottic enlargement, CXR portable suggestive of small airway infection/inflammation. CT angio/chest/PE showed Bilateral mid and lower lung airspace opacities concerning for multifocal pneumonia, less likely edema.  Pt with dysarthria and reports new SUDDEN onset of dysphagia x1 week ago and cough x4 days.  MBS indicated. Clinical Impression: Clinical Impression: Patient presents with mild oral and moderately severe pharyngeal phase dysphagia.  Patient demonstrates significant difficulty initiating swallow - with liquids filling pyriform sinus prior to swallow initiation - for up to 4 seconds with boluses filling pyrifom sinus.  Pt demonstrates penetration of liquids prior to swallow initiation x1 as barium spills into open airway before the swallow. However mostly aspiration is occuring AFTER the swallow as retention adheres to significant pharyngeal secretionsand spills into airway post-swallow.  She is clearing approx 75-85% of boluses with swallowing - however post-swallow retention is aspirated.  A multitude of compensation strategies attempted including head turn right *where pt senses retention* and left as well as chin tuck with head turn left did not improve clearance.  Dry swallows helps with clearance but are very difficult for pt to conduct.  PES opening was inadequate allowing retention in pyriform sinus.   Pt is aspirating secretions chronically.  She reports desire to eat - regardless of dysphagia.  Defer to MD for po option with mitigation strategies.  Pt may aspirate less frequently with full liquid - nectar at this time and allow thin water between meals. Factors that may increase risk of adverse event in presence of aspiration  Rubye Oaks & Clearance Coots 2021): Factors that may increase risk of adverse event in presence of aspiration Rubye Oaks & Clearance Coots 2021): Aspiration of thick, dense, and/or acidic materials (poor secretion management) Recommendations/Plan: Swallowing Evaluation Recommendations Swallowing Evaluation Recommendations Recommendations: -- (defer to MD, but pt wants to eat) Medication Administration: Other (Comment) Treatment Plan Treatment Plan Treatment recommendations: Therapy as outlined in treatment plan below Follow-up recommendations: Follow physicians's recommendations for discharge plan and follow up therapies Functional status assessment: Patient has had a recent decline in their functional status and demonstrates the ability to make significant improvements in function in a reasonable and predictable amount of time. Treatment frequency: Min 2x/week Treatment duration: 1 week Interventions: Aspiration precaution training; Compensatory techniques; Patient/family education; Trials of upgraded texture/liquids Recommendations Recommendations for follow up therapy are one component of a multi-disciplinary discharge planning process, led by the attending physician.  Recommendations may be updated based on patient status, additional functional criteria and insurance authorization. Assessment: Orofacial Exam: Orofacial Exam Oral Cavity: Oral Hygiene: Pooled secretions (needing frequent oral suctioning) Oral Cavity - Dentition: Adequate natural dentition Orofacial Anatomy: WFL Oral Motor/Sensory Function: Suspected cranial nerve impairment CN V - Trigeminal: WFL CN VII - Facial: WFL CN IX - Glossopharyngeal, CN X - Vagus: -- (posterior tongue appears elevated on right compared to left with mouth opening) CN XII - Hypoglossal: -- (pt is dysarthric, subjectively appears with right sided weakness more than left- with pushing on right) Anatomy: Anatomy: Other (Comment) (? appearance of unilateral bulging on the right -) Boluses  Administered: Boluses Administered Boluses Administered: Thin liquids (Level 0); Mildly thick liquids (Level 2, nectar thick); Moderately thick liquids (Level 3, honey thick); Puree; Solid  Oral Impairment Domain: Oral Impairment Domain Lip Closure: No labial escape Tongue control during bolus hold: Posterior escape of less than half of bolus Bolus preparation/mastication: Slow prolonged chewing/mashing with complete recollection Bolus transport/lingual motion: Brisk tongue motion Oral residue: Trace residue lining oral structures Location of oral residue : Tongue Initiation of pharyngeal swallow : Pyriform sinuses  Pharyngeal Impairment Domain: Pharyngeal Impairment Domain Soft palate elevation: No bolus between soft palate (SP)/pharyngeal wall (PW) Laryngeal elevation: Partial superior movement of thyroid cartilage/partial approximation of arytenoids to epiglottic petiole Anterior hyoid excursion: Partial anterior movement Epiglottic movement: Partial inversion Laryngeal vestibule closure: Incomplete, narrow column air/contrast in laryngeal vestibule Pharyngeal stripping wave : Present - diminished Pharyngeal contraction (A/P view only): Unilateral bulging Pharyngoesophageal segment opening: Partial distention/partial duration, partial obstruction of flow Tongue base retraction: Narrow column of contrast or air between tongue base and PPW Pharyngeal residue: Collection of residue within or on pharyngeal structures; Trace residue within or on pharyngeal structures Location of pharyngeal residue: Valleculae; Pyriform sinuses; Aryepiglottic folds; Tongue base  Esophageal Impairment Domain: No data recorded Pill: Pill Consistency administered: -- (DNT) Penetration/Aspiration Scale Score: Penetration/Aspiration Scale Score 1.  Material does not enter airway: Solid 2.  Material enters airway, remains ABOVE vocal cords then ejected out: Puree 5.  Material enters airway, CONTACTS cords and not ejected out: Moderately  thick liquids (Level 3, honey thick) 8.  Material enters airway, passes BELOW cords without attempt by patient to eject out (silent aspiration) : Thin liquids (Level 0); Mildly thick liquids (Level 2, nectar thick) Compensatory Strategies: Compensatory Strategies Compensatory strategies: Yes Effortful swallow: Ineffective Ineffective Effortful Swallow: Mildly thick liquid (Level  2, nectar thick); Thin liquid (Level 0) Multiple swallows: Effective Effective Multiple Swallows: Thin liquid (Level 0); Mildly thick liquid (Level 2, nectar thick); Moderately thick liquid (Level 3, honey thick); Puree; Solid (but difficult for pt to perform) Chin tuck: Ineffective Ineffective Chin Tuck: Thin liquid (Level 0); Mildly thick liquid (Level 2, nectar thick) Left head turn: Ineffective Ineffective Left Head Turn: Mildly thick liquid (Level 2, nectar thick); Thin liquid (Level 0) Right head turn: Ineffective Ineffective Right Head Turn: Thin liquid (Level 0); Mildly thick liquid (Level 2, nectar thick) Posterior head tilt: Ineffective Ineffective Posterior head tilt: Moderately thick liquid (Level 3, honey thick); Mildly thick liquid (Level 2, nectar thick) Chin tuck combined with head turn: Ineffective Ineffective Chin tuck combined with head turn: Mildly thick liquid (Level 2, nectar thick)   General Information: Caregiver present: No  Diet Prior to this Study: NPO   Temperature : Normal   Respiratory Status: WFL (pt coughing on secretions)   Supplemental O2: None (Room air)   History of Recent Intubation: No  Behavior/Cognition: Alert; Cooperative; Pleasant mood Self-Feeding Abilities: Able to self-feed Baseline vocal quality/speech: Abnormal resonance Volitional Cough: Able to elicit Volitional Swallow: -- (with effort, dry swallows are challenging for pt to conduct) Exam Limitations: No limitations Goal Planning: Prognosis for improved oropharyngeal function: Fair No data recorded No data recorded Patient/Family Stated Goal:  pt wants to eat Consulted and agree with results and recommendations: Patient Pain: Pain Assessment Pain Assessment: No/denies pain End of Session: Start Time:SLP Start Time (ACUTE ONLY): 1400 Stop Time: SLP Stop Time (ACUTE ONLY): 1445 Time Calculation:SLP Time Calculation (min) (ACUTE ONLY): 45 min Charges: SLP Evaluations $ SLP Speech Visit: 1 Visit SLP Evaluations $BSS Swallow: 1 Procedure $MBS Swallow: 1 Procedure $Swallowing Treatment: 1 Procedure SLP visit diagnosis: SLP Visit Diagnosis: Dysphagia, pharyngoesophageal phase (R13.14); Dysphagia, oropharyngeal phase (R13.12) Past Medical History: Past Medical History: Diagnosis Date  Cancer (HCC)   seizures  Diabetes (HCC)   Diabetes mellitus without complication (HCC)   Heartburn   occasional; OTC as needed  Herpes genitalis in men   HIV (human immunodeficiency virus infection) (HCC)   HIV disease (HCC)   HTN (hypertension)   Hyperlipidemia   Hypertension   under control with med., has been on med. x 1 yr.  Lateral malleolar fracture 09/02/2013  left  Migraines   Tear of deltoid ligament of left ankle 09/02/2013  Type 2 diabetes mellitus with hyperosmolar nonketotic hyperglycemia (HCC) 02/10/2020 Past Surgical History: Past Surgical History: Procedure Laterality Date  NO PAST SURGERIES    ORIF ANKLE FRACTURE Left 09/13/2013  Procedure: OPEN REDUCTION INTERNAL FIXATION (ORIF) LEFT LATERAL MALLEOLUS ANKLE FRACTURE ;  Surgeon: Dannielle Huh, MD;  Location: [facility];  Service: Orthopedics;  Laterality: Left; A-P view Rolena Infante, MS [facility] SLP Acute Rehab Services Office [phone] Chales Abrahams 09/28/2023, 4:16 PM  CT HEAD WO CONTRAST ( ) Result Date: 09/28/2023 CLINICAL DATA:  48 year old male with increased right side weakness, garbled speech, confusion. Neurologic deficit. Aspiration. EXAM: CT HEAD WITHOUT CONTRAST TECHNIQUE: Contiguous axial images were obtained from the base of the skull through the vertex without intravenous contrast.  RADIATION DOSE REDUCTION: This exam was performed according to the departmental dose-optimization program which includes automated exposure control, adjustment of the mA and/or kV according to patient size and/or use of iterative reconstruction technique. COMPARISON:  Brain MRI 08/27/2023.  Head CT 09/25/2023. FINDINGS: Brain: Study is mildly degraded by motion artifact despite repeated imaging attempts. Normal cerebral volume. No  midline shift, ventriculomegaly, mass effect, evidence of mass lesion, intracranial hemorrhage or evidence of cortically based acute infarction. Gray-white matter differentiation is within normal limits throughout the brain. Vascular: No suspicious intracranial vascular hyperdensity. Skull: Intact.  No acute osseous abnormality identified. Sinuses/Orbits: Visualized paranasal sinuses and mastoids are stable and well aerated. Other: Chronic Disconjugate gaze. Visualized scalp soft tissues are within normal limits. IMPRESSION: Stable and negative noncontrast CT appearance of the brain when allowing for mildly motion degraded exam. Electronically Signed   By: Odessa Fleming M.D.   On: 09/28/2023 13:58   CT Angio Chest Pulmonary Embolism (PE) W or WO Contrast Result Date: 09/27/2023 CLINICAL DATA:  Shortness of breath, hypoxia EXAM: CT ANGIOGRAPHY CHEST WITH CONTRAST TECHNIQUE: Multidetector CT imaging of the chest was performed using the standard protocol during bolus administration of intravenous contrast. Multiplanar CT image reconstructions and MIPs were obtained to evaluate the vascular anatomy. RADIATION DOSE REDUCTION: This exam was performed according to the departmental dose-optimization program which includes automated exposure control, adjustment of the mA and/or kV according to patient size and/or use of iterative reconstruction technique. CONTRAST:  OMNIPAQUE IOHEXOL 350 MG/ML SOLN COMPARISON:  08/28/2023 FINDINGS: Cardiovascular: Heart is normal size. Aorta is normal caliber.  Scattered coronary artery calcifications. No filling defects in the pulmonary arteries to suggest pulmonary emboli. Mediastinum/Nodes: No mediastinal, hilar, or axillary adenopathy. Trachea and thyroid unremarkable. There is wall thickening involving the distal esophagus suggesting esophagitis. Lungs/Pleura: Again noted is the 1.8 cm left lower lobe subpleural nodule medially on image 99, stable since prior study. Airspace disease noted in both lower lobes, right middle lobe and lingula concerning for pneumonia. No effusions. Upper Abdomen: Hepatic steatosis.  No acute findings. Musculoskeletal: Chest wall soft tissues are unremarkable. No acute bony abnormality. Review of the MIP images confirms the above findings. IMPRESSION: No evidence of pulmonary embolus. Bilateral mid and lower lung airspace opacities concerning for multifocal pneumonia, less likely edema. Hepatic steatosis. Scattered coronary artery disease. Electronically Signed   By: Charlett Nose M.D.   On: 09/27/2023 00:11   DG Chest Portable 1 View Result Date: 09/26/2023 CLINICAL DATA:  Shortness of breath, hypoxia EXAM: PORTABLE CHEST 1 VIEW COMPARISON:  Radiograph 08/25/2023 and CT chest 08/28/2023 FINDINGS: Stable cardiomegaly. Reticulonodular opacities in the lower lungs. Retrocardiac atelectasis or consolidation. No pleural effusion or pneumothorax. No displaced rib fractures. IMPRESSION: Reticulonodular opacities in the lower lungs, suggestive of small airway infection/inflammation. Electronically Signed   By: Minerva Fester M.D.   On: 09/26/2023 21:08   DG Neck Soft Tissue Result Date: 09/26/2023 CLINICAL DATA:  Dysphagia, drooling, concern for epiglottitis EXAM: NECK SOFT TISSUES - 1+ VIEW COMPARISON:  None Available. FINDINGS: There is no evidence of retropharyngeal soft tissue swelling or epiglottic enlargement. The cervical airway is unremarkable and no radio-opaque foreign body identified. IMPRESSION: Negative. Electronically Signed    By: Minerva Fester M.D.   On: 09/26/2023 21:06   CT Head Wo Contrast Result Date: 09/25/2023 CLINICAL DATA:  Headache EXAM: CT HEAD WITHOUT CONTRAST TECHNIQUE: Contiguous axial images were obtained from the base of the skull through the vertex without intravenous contrast. RADIATION DOSE REDUCTION: This exam was performed according to the departmental dose-optimization program which includes automated exposure control, adjustment of the mA and/or kV according to patient size and/or use of iterative reconstruction technique. COMPARISON:  08/25/2023 FINDINGS: Brain: No mass,hemorrhage or extra-axial collection. Normal appearance of the parenchyma and CSF spaces. Vascular: No hyperdense vessel or unexpected vascular calcification. Skull: The visualized skull  base, calvarium and extracranial soft tissues are normal. Sinuses/Orbits: No fluid levels or advanced mucosal thickening of the visualized paranasal sinuses. No mastoid or middle ear effusion. Normal orbits. Other: None. IMPRESSION: Normal head CT. Electronically Signed   By: Deatra Robinson M.D.   On: 09/25/2023 02:59     Rexene Alberts, MSN, NP-C [facility]  [email or web address] Pager: [phone] Office: [phone] [facility] Main Line: [phone] *Secure Chat Communication Welcome

## 2023-10-05 NOTE — Progress Notes (Signed)
 NAME:  Jeremiah Stevens, MRN:  [id number], DOB:  Apr 26, 1976, LOS: 9 ADMISSION DATE:  09/26/2023, CONSULTATION DATE:  09/29/23 REFERRING MD:  Rennis Chris CHIEF COMPLAINT:  Hypoxia   History of Present Illness:  Pt is encephelopathic; therefore, this HPI is obtained from chart review. Jeremiah Stevens is a 48 y.o. adult who has a PMH as below including but not limited to DM2, seizures, HIV AIDS untreated, cryptococcal disease, obesity, polysubstance abuse, prior CVA. She was admitted 09/26/23 with dysphagia x 3 days along with headaches and drooling. He was found to be hypoxic to 80s on room air.  CTA was negative for PE but showed multifocal PNA. He was started on abx and BD's. Biktarvy was continued and ID was consulted.   3/18 He had right facial droop, right sided weakness, dysarthria. Neurology was consulted and recommended CTA and MRI. MRI demonstrated acute right medullary infarct.  Despite ongoing dysphagia (MBS showed moderately severe pharyngeal phase dysphagia and imaging showed unilateral bulging on right), pt insisted on eating. He reportedly ordered pizza overnight 3/18 and certainly aspirated. He had threatened to leave AMA despite stroke workup ongoing.  3/19, he had hypoxia for which rapid response was called. While being assess, he continued to desaturate down into the 60s and then 40s. He required BVM and was transferred to the ICU for further evaluation and management. Upon arrival in the ICU, she had ongoing desaturations and bradycardia before developing PEA arrest.  She had 9 minutes ACLS prior to ROSC including epi x 4 and bicarb x 2. During intubation attempt, she had copious food material and emesis in her oropharynx. Visualization of vocal cords was difficult due to amount of food material and emesis despite suctioning. On 2nd attempt, ETT was passed successfully and saturations improved shortly thereafter. There was grand mal seizure activity noted after intubation that  abated after Midazolam administration.  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[facility]); Non-suicidal depressed mood; Gender dysphoria; RLL pneumonia; AKI (acute kidney injury) (HCC); Uncontrolled type 2 diabetes mellitus with hyperglycemia, without long-term current use of insulin (HCC); Tobacco abuse; HIV infection (HCC); Positive RPR test; Hyperosmolar hyperglycemic state (HHS) (HCC); Seizure (HCC); Acute hypoxic respiratory failure (HCC); Acute encephalopathy; Hyperammonemia (HCC); Pancytopenia (HCC); Cryptococcosis (HCC); CAP (community acquired pneumonia); Globus sensation; Aspiration pneumonia of both lungs due to vomit (HCC); Cardiac arrest, cause unspecified (HCC); Cerebrovascular accident (CVA) (HCC); and Seizures (HCC) on their problem list.  Significant Hospital Events: Including procedures, antibiotic start and stop dates in addition to other pertinent events   3/16 admit 3/18 neuro consult, found to have acute CVA 3/19 Ongoing aspiration despite obvious dysphagia, was planning to leave AMA but sister talked pt into staying. then had a massive aspiration event leading to worse hypoxia and PEA arrest. Txf to [facility] 3/20 PSV cumulative total of 5-6 hrs, following some commands/ purposefully 3/21 self extubated, was reintubated 3/23 Failed SBT due to WOB 3/24 Failed SBT due to apnea  Interim History / Subjective:   Weaned Precedex to 0.4 Tolerating SBT this am  Objective:  Blood pressure 127/66, pulse 74, temperature 99.1 F (37.3 C), resp. rate 16, height 6' (1.829 m), weight 103.1 kg, SpO2 100%.    Vent Mode: PRVC FiO2 (%):  [40 %] 40 % Set Rate:  [16 bmp] 16 bmp Vt Set:  [550 mL] 550 mL PEEP:  [8 cmH20] 8 cmH20 Pressure Support:  [12 cmH20] 12 cmH20 Plateau  Pressure:  [20 cmH20-22 cmH20] 20 cmH20   Intake/Output Summary (Last 24 hours) at 10/05/2023 0741 Last  data filed at 10/05/2023 0600 Gross per 24 hour  Intake 2657.46 ml  Output 3050 ml  Net -392.54 ml   Filed Weights   10/03/23 0500 10/04/23 0342 10/05/23 0151  Weight: 116.3 kg 102.8 kg 103.1 kg   Physical Exam: General: Chronically ill-appearing, no acute distress HENT: [place], AT, ETT in place Eyes: EOMI, no scleral icterus Respiratory: Clear to auscultation bilaterally.  No crackles, wheezing or rales Cardiovascular: RRR, -M/R/G, no JVD GI: BS+, soft, nontender Extremities:-Edema,-tenderness Neuro: Awake, nodding head and following commands, CNII-XII grossly intact GU: External foley in place  3/24 K 5.4 BUN/CR 49/1.63  Today's labs pending Pneumocystis smear and resp culture pending  Labs/imaging personally reviewed:  CTA chest 3/16 > no PE, multifocal PNA. CT head 3/18 > neg. MRI brain 3/19 > acute right medullary infarct, cytotoxic edema with no hemorrhage or mass effect, small chronic right cerebellar infarcts. CTA head/neck 3/19 > no LVO, advanced for age atherosclerosis of L vertebral artery and prox basilar artery, possible mod L V4 stenosis.   Assessment & Plan:   PEA arrest Sz like activity  -reportedly purposeful movements 3/20  P -cEEG per neuro, stopped 3/22> no seizures noted -keppra loaded 3/19, now on BID keppra 500mg   -neuro following -supportive care post arrest, avoid fevers   Acute hypoxic respiratory failure Multifocal PNA, CAP  Possible pneumocystis pneumonia Aspiration event Dysphagia  Tobacco use  P - Full vent support - LTVV, 4-8cc/kg IBW with goal Pplat<30 and DP<15 - Wean precedex and PRN fentanyl for sedation - SBT/WUA. Consider extubation if passes - VAP, pulm hygiene - F/u Pneumocystis smear and PCR from tracheal aspirate - prn nebs - nicotine patch  - 3/23: 40mg  solumedrol IV BID and primaquine + clindamycin for pneumocystis coverage (avoiding bactrim due to renal function)  Acute R medullary infarct, cytotoxic edema Hx small  R cerebellar infarcts, R PICA territory infarct  Encephalopathy, multifactorial P - Stroke following, cont ASA, plavix, statin - secondary w/u per stroke service  - PT/ OT  HFpEF Hx HTN HLD P - cont to monitor hemodynamics, pending dex response, consider add back pta meds - statin, as above   AKI  - good UOP - Monitor UOP/Cr - hemodynamic support - cont foley, strict I/Os, avoid nephrotoxins  Normocytic anemia - trend CBC  HIV / AIDS Hx +cryptococcal antigen  VL 3410 CD4 36 P - ID following - descovy per tube - fluconazole 400mg  daily - F/u PCP smear and PCR - Primaquine and clindamycin for PCP coverage  DM2, with hyperglycemia P - Resistant SSI - semglee 30 units BID - 8 units q4hr TF coverage  Anemia, normocytic, mild - monitor  Hx provoked sz (hyperglycemia/ hypoxemia)  Seizure like activity following PEA arrest 3/19  P - AEDs as above  Hyperthyroidism r/o  -TSH low T3 normal, T4 normal   THC use  - cessation counseling when able  Code status - iPAL note 3/19 - DNR status - 3/21 patient self extubated and family reversed to full code - 3/25. Sister confirms full code for now but would like to engage patient when able  - Will need to readdress code status when patient able to communicate  Best practice (evaluated daily):  Diet/type: NPO; TF  DVT prophylaxis: LMWH Pressure ulcer(s): pressure ulcer assessment deferred  GI prophylaxis: PPI Lines: N/A Foley:  Yes, and it is still needed Code Status:  full code Last date of multidisciplinary goals of care discussion: 3/25. Sister confirms full code for now but would like to engage patient when able   The patient is critically ill with multiple organ systems failure and requires high complexity decision making for assessment and support, frequent evaluation and titration of therapies, application of advanced monitoring technologies and extensive interpretation of multiple databases.  Independent  Critical Care Time: 37 Minutes.   Mechele Collin, M.D. [facility] Pulmonary/Critical Care Medicine 10/05/2023 7:41 AM   Please see Amion for pager number to reach on-call Pulmonary and Critical Care Team.

## 2023-10-05 NOTE — Progress Notes (Signed)
 Vent mode changed to Pressure Control for patient comfort. Patient asynchronous with the ventilator but improved with mode change. Patient agitated at this time, RN at bedside. No respiratory distress noted at this time.    10/05/23 2352  Vent Select  Invasive or Noninvasive Invasive  Adult Vent Y  Airway 7.5 mm  Placement Date/Time: 10/01/23 1405   Grade View: Grade 1  Placed By: ICU physician  Airway Device: Endotracheal Tube  Laryngoscope Blade: 4  ETT Types: Oral  Size (mm): 7.5 mm  Cuffed: Cuffed  Insertion attempts: 1  Airway Equipment: Stylet;Video Lary...  Secured at (cm) 26 cm  Measured From Lips  Secured Location Center  Secured By English as a second language teacher Yes  Tube Holder Repositioned Yes  Prone position No  Cuff Pressure (cm H2O) Green OR 18-26 CmH2O  Site Condition Dry  Adult Ventilator Settings  Vent Type Servo i  Humidity HME  Vent Mode (S)  PCV  Set Rate 16 bmp  FiO2 (%) 40 %  I Time 1 Sec(s)  Pressure Control 20 cmH20  PEEP 8 cmH20  Adult Ventilator Measurements  Peak Airway Pressure 30 L/min  Mean Airway Pressure 17 cmH20  Resp Rate Total 28 br/min  Exhaled Vt 536 mL  Measured Ve 10.6 L  I:E Ratio Measured 1:1.6  SpO2 96 %  Adult Ventilator Alarms  Alarms On Y  Ve High Alarm 21 L/min  Ve Low Alarm 4 L/min  Resp Rate High Alarm 38 br/min  Resp Rate Low Alarm 0  PEEP Low Alarm 6 cmH2O  Press High Alarm 45 cmH2O  VAP Prevention  HOB> 30 Degrees Y  Breath Sounds  Bilateral Breath Sounds Clear;Diminished  Vent Respiratory Assessment  Respiratory Pattern Tachypnea  Airway Suctioning/Secretions  Suction Type  (Done by RN)

## 2023-10-06 DIAGNOSIS — I63541 Cerebral infarction due to unspecified occlusion or stenosis of right cerebellar artery: Secondary | ICD-10-CM | POA: Diagnosis not present

## 2023-10-06 DIAGNOSIS — I469 Cardiac arrest, cause unspecified: Secondary | ICD-10-CM | POA: Diagnosis not present

## 2023-10-06 DIAGNOSIS — I6389 Other cerebral infarction: Secondary | ICD-10-CM | POA: Diagnosis not present

## 2023-10-06 DIAGNOSIS — J189 Pneumonia, unspecified organism: Secondary | ICD-10-CM | POA: Diagnosis not present

## 2023-10-06 DIAGNOSIS — J9601 Acute respiratory failure with hypoxia: Secondary | ICD-10-CM | POA: Diagnosis not present

## 2023-10-06 LAB — BASIC METABOLIC PANEL
Anion gap: 11 (ref 5–15)
BUN: 62 mg/dL — ABNORMAL HIGH (ref 6–20)
CO2: 28 mmol/L (ref 22–32)
Calcium: 10 mg/dL (ref 8.9–10.3)
Chloride: 109 mmol/L (ref 98–111)
Creatinine, Ser: 1.52 mg/dL — ABNORMAL HIGH (ref 0.61–1.24)
GFR, Estimated: 57 mL/min — ABNORMAL LOW (ref >60)
Glucose, Bld: 177 mg/dL — ABNORMAL HIGH (ref 70–99)
Potassium: 5.1 mmol/L (ref 3.5–5.1)
Sodium: 148 mmol/L — ABNORMAL HIGH (ref 135–145)

## 2023-10-06 LAB — GLUCOSE 6 PHOSPHATE DEHYDROGENASE
G6PDH: 9.7 U/g{Hb} (ref 3.8–14.2)
Hemoglobin: 9.9 g/dL — ABNORMAL LOW (ref 13.0–17.7)

## 2023-10-06 LAB — GLUCOSE, CAPILLARY
Glucose-Capillary: 339 mg/dL — ABNORMAL HIGH (ref 70–99)
Glucose-Capillary: 278 mg/dL — ABNORMAL HIGH (ref 70–99)
Glucose-Capillary: 173 mg/dL — ABNORMAL HIGH (ref 70–99)
Glucose-Capillary: 136 mg/dL — ABNORMAL HIGH (ref 70–99)
Glucose-Capillary: 203 mg/dL — ABNORMAL HIGH (ref 70–99)

## 2023-10-06 LAB — MAGNESIUM: Magnesium: 2.9 mg/dL — ABNORMAL HIGH (ref 1.7–2.4)

## 2023-10-06 MED ORDER — INSULIN ASPART 100 UNIT/ML IJ SOLN
20.0000 [IU] | INTRAMUSCULAR | Status: DC
  Administered 2023-10-06 – 2023-10-07 (×7): 20 [IU] via SUBCUTANEOUS

## 2023-10-06 MED ORDER — DEXMEDETOMIDINE HCL IN NACL 400 MCG/100ML IV SOLN
0.0000 ug/kg/h | INTRAVENOUS | Status: AC
  Administered 2023-10-06: 1.2 ug/kg/h via INTRAVENOUS
  Administered 2023-10-06: 1.1 ug/kg/h via INTRAVENOUS
  Administered 2023-10-06: 1.8 ug/kg/h via INTRAVENOUS
  Administered 2023-10-06: 1.2 ug/kg/h via INTRAVENOUS
  Administered 2023-10-06: 1 ug/kg/h via INTRAVENOUS
  Administered 2023-10-06: 1.2 ug/kg/h via INTRAVENOUS
  Administered 2023-10-06: 1.5 ug/kg/h via INTRAVENOUS
  Administered 2023-10-07 (×5): 1.8 ug/kg/h via INTRAVENOUS
  Administered 2023-10-08: 0.5 ug/kg/h via INTRAVENOUS
  Administered 2023-10-08: 0.8 ug/kg/h via INTRAVENOUS
  Administered 2023-10-09: 0.5 ug/kg/h via INTRAVENOUS
  Administered 2023-10-09: 0.8 ug/kg/h via INTRAVENOUS
  Administered 2023-10-10: 1 ug/kg/h via INTRAVENOUS
  Administered 2023-10-10: 0.9 ug/kg/h via INTRAVENOUS
  Administered 2023-10-10: 0.7 ug/kg/h via INTRAVENOUS
  Administered 2023-10-10: 0.9 ug/kg/h via INTRAVENOUS
  Administered 2023-10-10: 1 ug/kg/h via INTRAVENOUS
  Administered 2023-10-10 – 2023-10-11 (×6): 0.9 ug/kg/h via INTRAVENOUS
  Administered 2023-10-11: 0.898 ug/kg/h via INTRAVENOUS
  Administered 2023-10-12: 0.9 ug/kg/h via INTRAVENOUS
  Administered 2023-10-12: 0.7 ug/kg/h via INTRAVENOUS
  Filled 2023-10-06 (×30): qty 100

## 2023-10-06 MED ORDER — INSULIN ASPART 100 UNIT/ML IJ SOLN: 20.0000 [IU] | Freq: Once | INTRAMUSCULAR | Status: AC

## 2023-10-06 MED ORDER — POLYETHYLENE GLYCOL 3350 17 G PO PACK
17.0000 g | PACK | Freq: Every day | ORAL | Status: DC
  Administered 2023-10-09 – 2023-10-12 (×4): 17 g
  Filled 2023-10-06 (×4): qty 1

## 2023-10-06 MED ORDER — MIDAZOLAM HCL 2 MG/2ML IJ SOLN
2.0000 mg | INTRAMUSCULAR | Status: AC | PRN
  Administered 2023-10-06 – 2023-10-07 (×10): 2 mg via INTRAVENOUS
  Filled 2023-10-06 (×11): qty 2

## 2023-10-06 MED ORDER — LORAZEPAM 2 MG/ML IJ SOLN
INTRAMUSCULAR | Status: AC
  Filled 2023-10-06: qty 1

## 2023-10-06 MED ORDER — METOPROLOL TARTRATE 25 MG PO TABS
25.0000 mg | ORAL_TABLET | Freq: Two times a day (BID) | ORAL | Status: DC
  Administered 2023-10-06 – 2023-10-10 (×10): 25 mg
  Filled 2023-10-06 (×11): qty 1

## 2023-10-06 MED ORDER — INSULIN GLARGINE-YFGN 100 UNIT/ML ~~LOC~~ SOLN
60.0000 [IU] | Freq: Two times a day (BID) | SUBCUTANEOUS | Status: DC
  Administered 2023-10-06 – 2023-10-07 (×3): 60 [IU] via SUBCUTANEOUS
  Filled 2023-10-06 (×4): qty 0.6

## 2023-10-06 MED ORDER — LORAZEPAM 2 MG/ML IJ SOLN
0.5000 mg | Freq: Once | INTRAMUSCULAR | Status: AC
  Administered 2023-10-06: 0.5 mg via INTRAVENOUS

## 2023-10-06 MED ORDER — LORAZEPAM 2 MG/ML IJ SOLN: 0.5000 mg | Freq: Once | INTRAMUSCULAR | Status: AC

## 2023-10-06 NOTE — Progress Notes (Signed)
 NAME:  Jeremiah Stevens, MRN:  [id number], DOB:  Oct 09, 1975, LOS: 10 ADMISSION DATE:  09/26/2023, CONSULTATION DATE:  09/29/23 REFERRING MD:  Rennis Chris CHIEF COMPLAINT:  Hypoxia   History of Present Illness:  Pt is encephelopathic; therefore, this HPI is obtained from chart review. Jeremiah Stevens is a 48 y.o. adult who has a PMH as below including but not limited to DM2, seizures, HIV AIDS untreated, cryptococcal disease, obesity, polysubstance abuse, prior CVA. She was admitted 09/26/23 with dysphagia x 3 days along with headaches and drooling. He was found to be hypoxic to 80s on room air.  CTA was negative for PE but showed multifocal PNA. He was started on abx and BD's. Biktarvy was continued and ID was consulted.   3/18 He had right facial droop, right sided weakness, dysarthria. Neurology was consulted and recommended CTA and MRI. MRI demonstrated acute right medullary infarct.  Despite ongoing dysphagia (MBS showed moderately severe pharyngeal phase dysphagia and imaging showed unilateral bulging on right), pt insisted on eating. He reportedly ordered pizza overnight 3/18 and certainly aspirated. He had threatened to leave AMA despite stroke workup ongoing.  3/19, he had hypoxia for which rapid response was called. While being assess, he continued to desaturate down into the 60s and then 40s. He required BVM and was transferred to the ICU for further evaluation and management. Upon arrival in the ICU, she had ongoing desaturations and bradycardia before developing PEA arrest.  She had 9 minutes ACLS prior to ROSC including epi x 4 and bicarb x 2. During intubation attempt, she had copious food material and emesis in her oropharynx. Visualization of vocal cords was difficult due to amount of food material and emesis despite suctioning. On 2nd attempt, ETT was passed successfully and saturations improved shortly thereafter. There was grand mal seizure activity noted after intubation that  abated after Midazolam administration.  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[facility]); Non-suicidal depressed mood; Gender dysphoria; RLL pneumonia; AKI (acute kidney injury) (HCC); Uncontrolled type 2 diabetes mellitus with hyperglycemia, without long-term current use of insulin (HCC); Tobacco abuse; HIV infection (HCC); Positive RPR test; Hyperosmolar hyperglycemic state (HHS) (HCC); Seizure (HCC); Acute hypoxic respiratory failure (HCC); Acute encephalopathy; Hyperammonemia (HCC); Pancytopenia (HCC); Cryptococcosis (HCC); CAP (community acquired pneumonia); Globus sensation; Aspiration pneumonia of both lungs due to vomit (HCC); Cardiac arrest, cause unspecified (HCC); Cerebrovascular accident (CVA) (HCC); Seizures (HCC); and 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  Significant Hospital Events: Including procedures, antibiotic start and stop dates in addition to other pertinent events   3/16 admit 3/18 neuro consult, found to have acute CVA 3/19 Ongoing aspiration despite obvious dysphagia, was planning to leave AMA but sister talked pt into staying. then had a massive aspiration event leading to worse hypoxia and PEA arrest. Txf to [facility] 3/20 PSV cumulative total of 5-6 hrs, following some commands/ purposefully 3/21 self extubated, was reintubated 3/23 Failed SBT due to WOB 3/24 Failed SBT due to apnea 3/25 Tolerated PS >4 hours  Interim History / Subjective:   Tolerated PS overnight Agitated overnight and required increased Precedex  Objective:  Blood pressure 135/84, pulse 90, temperature 98.7 F (37.1 C), temperature source Oral, resp. rate (!) 22, height 6' (1.829 m), weight 103.1 kg, SpO2 98%.    Vent Mode: PCV FiO2 (%):  [40 %] 40 % Set Rate:  [16 bmp] 16 bmp Vt Set:  [550 mL]  550 mL PEEP:  [8 cmH20-10 cmH20] 8 cmH20 Pressure Support:   [10 cmH20] 10 cmH20 Plateau Pressure:  [25 cmH20] 25 cmH20   Intake/Output Summary (Last 24 hours) at 10/06/2023 0816 Last data filed at 10/06/2023 1610 Gross per 24 hour  Intake 1724.14 ml  Output 1800 ml  Net -75.86 ml   Filed Weights   10/03/23 0500 10/04/23 0342 10/05/23 0151  Weight: 116.3 kg 102.8 kg 103.1 kg   Physical Exam: General: Chronically ill-appearing, no acute distress HENT: [place], AT, ETT in place Eyes: EOMI, no scleral icterus Respiratory: Clear to auscultation bilaterally.  No crackles, wheezing or rales Cardiovascular: RRR, -M/R/G, no JVD GI: BS+, soft, nontender Extremities:-Edema,-tenderness Neuro: Awake and alert, nods head appropriately, CNII-XII grossly intact,  moves extremities x 4. BUE/BLE 4/5 GU: External foley in place   Imaging, labs and test in EMR in the last 24 hours reviewed independently by me. Pertinent findings below:  3/25 Improved Cr, stable K  Pneumocystis smear Resp culture 3/20 normal flora  Labs/imaging personally reviewed:  CTA chest 3/16 > no PE, multifocal PNA. CT head 3/18 > neg. MRI brain 3/19 > acute right medullary infarct, cytotoxic edema with no hemorrhage or mass effect, small chronic right cerebellar infarcts. CTA head/neck 3/19 > no LVO, advanced for age atherosclerosis of L vertebral artery and prox basilar artery, possible mod L V4 stenosis.   Assessment & Plan:   PEA arrest Sz like activity  -reportedly purposeful movements 3/20  P -cEEG per neuro, stopped 3/22> no seizures noted -keppra loaded 3/19, now on BID keppra 500mg . Will discuss with Neuro regarding if able to DC -neuro following -supportive care post arrest, avoid fevers   Acute hypoxic respiratory failure Multifocal PNA, CAP  Possible pneumocystis pneumonia Aspiration event Dysphagia  Tobacco use  P - Full vent support - LTVV, 4-8cc/kg IBW with goal Pplat<30 and DP<15 - Wean sedation. precedex and PRN fentanyl for sedation - SBT/WUA.  Extubate if passes - VAP, pulm hygiene - F/u Pneumocystis smear and PCR from tracheal aspirate - prn nebs - nicotine patch  - 3/23: 40mg  solumedrol IV BID and primaquine + clindamycin for pneumocystis coverage (avoiding bactrim due to renal function)  Acute R medullary infarct, cytotoxic edema Hx small R cerebellar infarcts, R PICA territory infarct  Encephalopathy, multifactorial P - Stroke following, cont ASA, plavix, statin - secondary w/u per stroke service  - PT/ OT  HFpEF Hx HTN HLD P - Resume home diltiazem - Resume home metop 25 BID - cont to monitor hemodynamics, pending dex response, consider add back pta meds - statin, as above   AKI  - improving Cr, good UOP - Monitor UOP/Cr - hemodynamic support - cont foley, strict I/Os, avoid nephrotoxins  Normocytic anemia - trend CBC  HIV / AIDS Hx +cryptococcal antigen  VL 3410 CD4 36 P - ID following - descovy per tube - fluconazole 400mg  daily - F/u PCP smear and PCR - Primaquine and clindamycin for PCP coverage  DM2, with hyperglycemia P - Resistant SSI - semglee 30 units BID - 8 units q4hr TF coverage  Anemia, normocytic, mild - monitor  Hx provoked sz (hyperglycemia/ hypoxemia)  Seizure like activity following PEA arrest 3/19  P - AEDs as above  Hyperthyroidism r/o  -TSH low T3 normal, T4 normal   THC use  - cessation counseling when able  Code status - iPAL note 3/19 - DNR status - 3/21 patient self extubated and family reversed to full code - 3/25. Patient  and sister agreed to plan for extubation and re-intubation. Would want trach if indicated in the future  Best practice (evaluated daily):  Diet/type: NPO; Hold TF  DVT prophylaxis: LMWH Pressure ulcer(s): pressure ulcer assessment deferred  GI prophylaxis: PPI Lines: N/A Foley:  Yes, and it is still needed Code Status:  full code Last date of multidisciplinary goals of care discussion: 3/25. Patient and sister agreed to plan for  extubation and re-intubation. Would want trach if indicated in the future  The patient is critically ill with multiple organ systems failure and requires high complexity decision making for assessment and support, frequent evaluation and titration of therapies, application of advanced monitoring technologies and extensive interpretation of multiple databases.  Independent Critical Care Time: 35 Minutes.   Mechele Collin, M.D. [facility] Pulmonary/Critical Care Medicine 10/06/2023 8:16 AM   Please see Amion for pager number to reach on-call Pulmonary and Critical Care Team.

## 2023-10-06 NOTE — Progress Notes (Signed)
 Transported pt from 4N29 to 2M04 with bedside RN and Tech. Vitals are stable.

## 2023-10-06 NOTE — Progress Notes (Signed)
 Physical Therapy Treatment Patient Details Name: Jeremiah Stevens MRN: [id number] DOB: May 20, 1976 Today's Date: 10/06/2023   History of Present Illness 48 yo pt initially admitted to [facility] on for difficulty swallowing. Rapid response on 3/19 with PEA, ROSC after 2 min, then bradycardic arrest 1 min later, CPR resumed, pt intubated, and ROSC 6 min later. Seizure-like activity also noted briefly after ROSC. Pt transferred to [facility] 3/20. 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 self extubated 3/21 reintubated PMH includes: HIV/AIDS, HTN, herpes, migraine, depression, DM II, tobacco use, and seizure.    PT Comments  Focused session on progressing her independence with functional mobility and on activating her core to improve midline sitting alignment and on activating her bil quads. The pt was self aware to not pull at her ETT and able to identify the direction of her lean and correct it when sitting EOB. She continues to communicate appropriately through gestures and head shakes/nods. She also continues to follow simple cues appropriately. She demonstrates weakness on her R side that affects her independence with all functional mobility. She required modA to manage her R leg and trunk to transition supine to sit L EOB and modAx2 to transition sit to supine once she fatigued after standing. She was able to progress to transferring to stand 1x briefly with maxAx2, bil knee block, and bil HHA but leaned posteriorly and had difficulty fully extending her hips. She would benefit from further acute PT services along with intensive inpatient rehab, > 3 hours/day.    If plan is discharge home, recommend the following: Assistance with cooking/housework;Direct supervision/assist for medications management;Direct supervision/assist for financial management;Assist for transportation;Help with stairs or ramp for entrance;Supervision due to cognitive status;A lot of help with walking and/or  transfers;A little help with bathing/dressing/bathroom   Can travel by private vehicle        Equipment Recommendations  Other (comment) (defer until gait training completed)    Recommendations for Other Services       Precautions / Restrictions Precautions Precautions: Fall Recall of Precautions/Restrictions: Impaired Precaution/Restrictions Comments: ETT, cortrak, primafit Restrictions Weight Bearing Restrictions Per Provider Order: No     Mobility  Bed Mobility Overal bed mobility: Needs Assistance Bed Mobility: Supine to Sit, Sit to Supine     Supine to sit: Mod assist, HOB elevated, +2 for safety/equipment Sit to supine: Mod assist, HOB elevated, +2 for physical assistance, +2 for safety/equipment   General bed mobility comments: Pt able to bring L leg off L EOB but needed assistance to fully bring R leg off L EOB. Cues provided for pt to pull up on therapist to her L to ascend trunk to sit L EOB, modA needed at trunk and R leg. ModAx2 needed to control trunk, lift legs back onto bed, and manage lines with return to supine as pt fatigued.    Transfers Overall transfer level: Needs assistance Equipment used: 2 person hand held assist Transfers: Sit to/from Stand Sit to Stand: +2 physical assistance, +2 safety/equipment, Max assist           General transfer comment: Bil knees blocked and pt cued to hold onto therapists' upper arms to stand from EOB. Pt initiated extending her knees well but had difficulty extending her hips, needing maxAx2 to power up to stand, cue hip extension, and maintain balance as pt leaned posteriorly    Ambulation/Gait               General Gait Details: unable at  this time   Stairs             Wheelchair Mobility     Tilt Bed    Modified Rankin (Stroke Patients Only) Modified Rankin (Stroke Patients Only) Pre-Morbid Rankin Score: Moderate disability Modified Rankin: Severe disability     Balance Overall balance  assessment: Needs assistance Sitting-balance support: Single extremity supported, Bilateral upper extremity supported, Feet supported Sitting balance-Leahy Scale: Poor Sitting balance - Comments: Pt leans posteriorly and to the R but is able to identify her directional lean and correct it when cued. Pt cued to line herself up with therapist anterior to her for visual guidance as needed. Pt varied from needing CGA-modA for static sitting balance, varying based on pt's level of fatigue Postural control: Right lateral lean, Posterior lean Standing balance support: Bilateral upper extremity supported, During functional activity Standing balance-Leahy Scale: Poor Standing balance comment: Reliant on bil knee block, bil UE support, and maxAx2 to stand at EOB briefly. Posterior lean noted                            Communication Communication Communication: Impaired Factors Affecting Communication: Trach/intubated  Cognition Arousal: Alert Behavior During Therapy: WFL for tasks assessed/performed   PT - Cognitive impairments: Difficult to assess Difficult to assess due to: Intubated                     PT - Cognition Comments: pt following all commands, nodding appropriately and gesturing to indicate needs. Pt did not attempt to pull ETT today, much more aware of safety concerns as she did not have restraints today either. Pt self aware of her directional lean, indicating and correcting it when cued Following commands: Intact      Cueing Cueing Techniques: Tactile cues, Verbal cues, Gestural cues  Exercises General Exercises - Lower Extremity Long Arc Quad: AROM, Strengthening, Both, 5 reps, Seated (while cuing pt to maintain her sitting balance, CGA-modA for trunk support)    General Comments General comments (skin integrity, edema, etc.): VSS on vent 40% FiO2 PEEP 8      Pertinent Vitals/Pain Pain Assessment Pain Assessment: Faces Faces Pain Scale: No hurt Pain  Intervention(s): Monitored during session    Home Living                          Prior Function            PT Goals (current goals can now be found in the care plan section) Acute Rehab PT Goals Patient Stated Goal: to return home PT Goal Formulation: With patient/family Time For Goal Achievement: 10/15/23 Potential to Achieve Goals: Good Progress towards PT goals: Progressing toward goals    Frequency    Min 3X/week      PT Plan      Co-evaluation              AM-PAC PT "6 Clicks" Mobility   Outcome Measure  Help needed turning from your back to your side while in a flat bed without using bedrails?: A Lot Help needed moving from lying on your back to sitting on the side of a flat bed without using bedrails?: A Lot Help needed moving to and from a bed to a chair (including a wheelchair)?: Total Help needed standing up from a chair using your arms (e.g., wheelchair or bedside chair)?: Total Help needed to walk in hospital room?:  Total Help needed climbing 3-5 steps with a railing? : Total 6 Click Score: 8    End of Session Equipment Utilized During Treatment: Oxygen;Gait belt Activity Tolerance: Patient tolerated treatment well;Patient limited by fatigue Patient left: in bed;with call bell/phone within reach;with bed alarm set;with family/visitor present Nurse Communication: Mobility status PT Visit Diagnosis: Unsteadiness on feet (R26.81);Hemiplegia and hemiparesis;Muscle weakness (generalized) (M62.81);Other abnormalities of gait and mobility (R26.89);Difficulty in walking, not elsewhere classified (R26.2);Other symptoms and signs involving the nervous system (R29.898) Hemiplegia - Right/Left: Right Hemiplegia - dominant/non-dominant: Non-dominant Hemiplegia - caused by: Cerebral infarction     Time: 1610-9604 PT Time Calculation (min) (ACUTE ONLY): 23 min  Charges:    $Therapeutic Activity: 8-22 mins $Neuromuscular Re-education: 8-22  mins PT General Charges $$ ACUTE PT VISIT: 1 Visit                     Virgil Benedict, PT, DPT Acute Rehabilitation Services  Office: [phone]    Jeremiah Stevens 10/06/2023, 5:28 PM

## 2023-10-06 NOTE — Progress Notes (Signed)
 "STROKE TEAM PROGRESS NOTE    SIGNIFICANT HOSPITAL EVENTS  3/16: Admitted for defeat dysphagia, found to have multifocal pneumonia likely due to aspiration. 3/18: Developed right facial droop and right-sided weakness.  MRI shows acute right medullary infarct  3/19: Developed hypoxia and respiratory distress leading to cardiac arrest with 9-minute downtime followed by seizure episode.  Difficult airway was noted due to emesis.  Transferred to ICU.  Cerebellar EEG negative LTM EEG placed overnight  INTERIM HISTORY/SUBJECTIVE  No family at the bedside.  Patient remains intubated and still requiring ventilatory support for respiratory failure.  He is awake and interactive and follows commands well and moves all 4 extremities well against gravity but right side appears slightly weaker today..Neurological exam remains the same with right gaze preference dysconjugate gaze, moves all extremities follows commands Vital signs stable.  Patient failed SBT trial yesterday due to increased work of breathing will likely need tracheostomy.  Plan to transfer from neuro ICU to medical ICU today due to requirements of bed  OBJECTIVE  CBC    Component Value Date/Time   WBC 4.9 10/04/2023 0620   RBC 3.60 (L) 10/04/2023 0620   HGB 10.6 (L) 10/04/2023 0620   HGB 9.9 (L) 10/03/2023 1043   HCT 33.5 (L) 10/04/2023 0620   PLT 203 10/04/2023 0620   MCV 93.1 10/04/2023 0620   MCH 29.4 10/04/2023 0620   MCHC 31.6 10/04/2023 0620   RDW 15.0 10/04/2023 0620   LYMPHSABS 0.8 09/29/2023 0846   MONOABS 0.7 09/29/2023 0846   EOSABS 0.0 09/29/2023 0846   BASOSABS 0.0 09/29/2023 0846    BMET    Component Value Date/Time   NA 148 (H) 10/06/2023 1237   K 5.1 10/06/2023 1237   CL 109 10/06/2023 1237   CO2 28 10/06/2023 1237   GLUCOSE 177 (H) 10/06/2023 1237   BUN 62 (H) 10/06/2023 1237   CREATININE 1.52 (H) 10/06/2023 1237   CREATININE 1.03 05/22/2020 1128   CALCIUM 10.0 10/06/2023 1237   GFRNONAA 57 (L)  10/06/2023 1237   GFRNONAA 89 05/22/2020 1128    IMAGING past 24 hours No results found.   Vitals:   10/06/23 1035 10/06/23 1100 10/06/23 1200 10/06/23 1300  BP:  107/75 123/83 114/76  Pulse: 60 67 70 70  Resp: (!) 25 18 20 18   Temp:   99.3 F (37.4 C)   TempSrc:   Axillary   SpO2: 90% 100% 100% 95%  Weight:      Height:         PHYSICAL EXAM General: Critically ill in no apparent distress Psych:  Mood and affect appropriate for situation CV: Regular rate and rhythm on monitor Respiratory:  Regular, unlabored respirations mechanical ventilator GI: Abdomen soft and nontender   NEURO:  Intubated, sedated.  Eyes are closed open opens to voice, following commands Cranial Nerves:  II: Pupils equal and reactive III, IV, VI: Midline, tracks bilaterally, blinks bilaterally VII: UTA due to ETT VIII: hearing intact to voice. IX, X: Cough and gag present. [id number] shrug 5/5. XII: UTA Motor/sensory: BUE: 4/5, equal grips BLE: 4/5 Coordination: Deferred Gait- deferred  Most Recent NIH: 8   ASSESSMENT/PLAN  Jeremiah Stevens is a 48 y.o. adult with history of HIV, DM, HTN, HLD, seizures in the setting of severe hyperglycemia, current smoker who presented to ED due to inability to swallow for 5 days, admitted for pnemonia. Neurology consulted 3/19 for dysphagia.  NIH on consult: 3.  Respiratory failure Cardiac arrest  Aspiration pneumonia  Developed hypoxia and respiratory distress leading to cardiac arrest with 9-minute downtime followed by seizure episode.  Difficult airway was noted due to emesis.  Intubated 3/19-> self-extubated 3/21-> re-intubated 3/21 Vent management per CCM Appreciate CCM assistance fentanyl and propofol off Precedex infusion at 0.8 mcg On rocephin and flagyl  Stroke:  right lateral medullary infarct, etiology: small or large vessel disease from uncontrolled stroke risk factors    CT head x 2 no acute finding CTA head & neck no LVO,  advanced for age arthrosclerosis L vertebral V4 segment and proximal basilar artery MRI  Acute Right Medullary Infarct. Cytotoxic edema with no hemorrhage or mass effect. 2D Echo: LVEF 60-65%, Grade I diastolic dysfunction LDL 95 HgbA1c 10.8 UDS positive for THC VTE prophylaxis -Lovenox No antithrombotic prior to admission, now on aspirin 81 mg daily and clopidogrel 75 mg daily.  For 3 months then aspirin alone Therapy recommendations:  Pending Disposition:  pending  Seizure  Post cardiac arrest seizure activity On keppra Now on LTM EEG LTM 3/21 This study is suggestive of mild to moderate diffuse encephalopathy. No seizures or epileptiform discharges were seen throughout the recording.  LTM 3/22 This study is suggestive of mild to moderate diffuse encephalopathy. No seizures or epileptiform discharges were seen throughout the recording. Will DC LTM today  Hx of Hypertension Now hypotensive Home meds:  lopressor Stable Levophed off since 3/20 Avoid hypotension, goal MAP >65 Long term BP goal normotensive  Hyperlipidemia Home meds:  none LDL 95, goal < 70 Add Lipitor 20mg  Continue statin on discharge  Diabetes type II Uncontrolled Home meds: Lantus insulin (previously noted noncompliance) HgbA1c 10.8, goal < 7.0 CBGs SSI Recommend close follow-up with PCP for better DM control  Tobacco Abuse Patient smokes 0.5 packs per day for 26 years      Ready to quit? N/A Nicotine replacement therapy provided  Substance Abuse UDS positive for  THC       Ready to quit? 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 These were not seen on 08/28/23 MRI  Other Active Problems Hx of HIV, Home Meds: Biktarvy, bactrim and diflucan   Hospital day # 10  Patient continues to require ventilator support for respiratory failure and has been difficult to wean and may require tracheostomy.  Critical care team to decide on whether to give him a second trial of extubation and go straight to tracheostomy.  Transfer to medical ICU.  Discussed with Dr. Everardo All critical care medicine. This patient is critically ill and at significant risk of neurological worsening, death and care requires constant monitoring of vital signs, hemodynamics,respiratory and cardiac monitoring, extensive review of multiple databases, frequent neurological assessment, discussion with family, other specialists and medical decision making of high complexity.I have made any additions or clarifications directly to the above note.This critical care time does not reflect procedure time, or teaching time or supervisory time of PA/NP/Med Resident etc but could involve care discussion time.  I spent 30 minutes of neurocritical care time  in the care of  this patient.            Delia Heady, MD Medical Director  [facility] Pager: [phone] 10/06/2023 2:47 PM

## 2023-10-06 NOTE — Progress Notes (Signed)
 eLink Physician-Brief Progress Note Patient Name: Jeremiah Stevens DOB: 10/01/1975 MRN: [id number]   Date of Service  10/06/2023  HPI/Events of Note  Patient is on the ventilator and requires restraints to prevent self-extubation.  eICU Interventions  Restraints ordered.        Migdalia Dk 10/06/2023, 8:26 PM

## 2023-10-06 NOTE — Progress Notes (Signed)
 On first assessment patient wearing DNR bracelet, but full code on chart. Removed DNR bracelet as per chart pt is full code. Contacted elink to verify code status.

## 2023-10-07 ENCOUNTER — Inpatient Hospital Stay (HOSPITAL_COMMUNITY)

## 2023-10-07 DIAGNOSIS — I6389 Other cerebral infarction: Secondary | ICD-10-CM | POA: Diagnosis not present

## 2023-10-07 DIAGNOSIS — N179 Acute kidney failure, unspecified: Secondary | ICD-10-CM | POA: Diagnosis not present

## 2023-10-07 DIAGNOSIS — B20 Human immunodeficiency virus [HIV] disease: Secondary | ICD-10-CM | POA: Diagnosis not present

## 2023-10-07 DIAGNOSIS — J9690 Respiratory failure, unspecified, unspecified whether with hypoxia or hypercapnia: Secondary | ICD-10-CM | POA: Diagnosis not present

## 2023-10-07 DIAGNOSIS — B459 Cryptococcosis, unspecified: Secondary | ICD-10-CM | POA: Diagnosis not present

## 2023-10-07 DIAGNOSIS — Z9911 Dependence on respirator [ventilator] status: Secondary | ICD-10-CM | POA: Diagnosis not present

## 2023-10-07 DIAGNOSIS — J9601 Acute respiratory failure with hypoxia: Secondary | ICD-10-CM | POA: Diagnosis not present

## 2023-10-07 LAB — BASIC METABOLIC PANEL WITH GFR
Anion gap: 4 — ABNORMAL LOW (ref 5–15)
BUN: 57 mg/dL — ABNORMAL HIGH (ref 6–20)
CO2: 34 mmol/L — ABNORMAL HIGH (ref 22–32)
Calcium: 9.3 mg/dL (ref 8.9–10.3)
Chloride: 110 mmol/L (ref 98–111)
Creatinine, Ser: 1.59 mg/dL — ABNORMAL HIGH (ref 0.61–1.24)
GFR, Estimated: 54 mL/min — ABNORMAL LOW (ref >60)
Glucose, Bld: 222 mg/dL — ABNORMAL HIGH (ref 70–99)
Potassium: 5.5 mmol/L — ABNORMAL HIGH (ref 3.5–5.1)
Sodium: 148 mmol/L — ABNORMAL HIGH (ref 135–145)

## 2023-10-07 LAB — CBC
HCT: 36.1 % — ABNORMAL LOW (ref 39.0–52.0)
Hemoglobin: 11 g/dL — ABNORMAL LOW (ref 13.0–17.0)
MCH: 29.4 pg (ref 26.0–34.0)
MCHC: 30.5 g/dL (ref 30.0–36.0)
MCV: 96.5 fL (ref 80.0–100.0)
Platelets: 200 10*3/uL (ref 150–400)
RBC: 3.74 MIL/uL — ABNORMAL LOW (ref 4.22–5.81)
RDW: 14.7 % (ref 11.5–15.5)
WBC: 5.7 10*3/uL (ref 4.0–10.5)
nRBC: 0 % (ref 0.0–0.2)

## 2023-10-07 LAB — GLUCOSE, CAPILLARY
Glucose-Capillary: 102 mg/dL — ABNORMAL HIGH (ref 70–99)
Glucose-Capillary: 120 mg/dL — ABNORMAL HIGH (ref 70–99)
Glucose-Capillary: 175 mg/dL — ABNORMAL HIGH (ref 70–99)
Glucose-Capillary: 178 mg/dL — ABNORMAL HIGH (ref 70–99)
Glucose-Capillary: 212 mg/dL — ABNORMAL HIGH (ref 70–99)
Glucose-Capillary: 220 mg/dL — ABNORMAL HIGH (ref 70–99)
Glucose-Capillary: 31 mg/dL — CL (ref 70–99)
Glucose-Capillary: 61 mg/dL — ABNORMAL LOW (ref 70–99)
Glucose-Capillary: 65 mg/dL — ABNORMAL LOW (ref 70–99)
Glucose-Capillary: 67 mg/dL — ABNORMAL LOW (ref 70–99)
Glucose-Capillary: 79 mg/dL (ref 70–99)

## 2023-10-07 LAB — POTASSIUM: Potassium: 4.7 mmol/L (ref 3.5–5.1)

## 2023-10-07 LAB — MAGNESIUM: Magnesium: 2.8 mg/dL — ABNORMAL HIGH (ref 1.7–2.4)

## 2023-10-07 MED ORDER — METHYLPREDNISOLONE SODIUM SUCC 125 MG IJ SOLR
80.0000 mg | Freq: Every day | INTRAMUSCULAR | Status: AC
  Administered 2023-10-07: 80 mg via INTRAVENOUS

## 2023-10-07 MED ORDER — INSULIN GLARGINE-YFGN 100 UNIT/ML ~~LOC~~ SOLN
30.0000 [IU] | Freq: Two times a day (BID) | SUBCUTANEOUS | Status: DC
  Administered 2023-10-07 – 2023-10-10 (×6): 30 [IU] via SUBCUTANEOUS
  Filled 2023-10-07 (×7): qty 0.3

## 2023-10-07 MED ORDER — SODIUM CHLORIDE 0.45 % IV SOLN: INTRAVENOUS | Status: DC

## 2023-10-07 MED ORDER — METHYLPREDNISOLONE SODIUM SUCC 40 MG IJ SOLR: 20.0000 mg | Freq: Every day | INTRAMUSCULAR | Status: DC

## 2023-10-07 MED ORDER — INSULIN ASPART 100 UNIT/ML IJ SOLN
10.0000 [IU] | INTRAMUSCULAR | Status: DC
  Administered 2023-10-07 – 2023-10-10 (×16): 10 [IU] via SUBCUTANEOUS

## 2023-10-07 MED ORDER — METHYLPREDNISOLONE SODIUM SUCC 125 MG IJ SOLR
80.0000 mg | Freq: Every day | INTRAMUSCULAR | Status: DC
  Filled 2023-10-07: qty 2

## 2023-10-07 MED ORDER — METHYLPREDNISOLONE SODIUM SUCC 40 MG IJ SOLR
40.0000 mg | Freq: Every day | INTRAMUSCULAR | Status: DC
  Administered 2023-10-08 – 2023-10-09 (×2): 40 mg via INTRAVENOUS
  Filled 2023-10-07 (×2): qty 1

## 2023-10-07 MED ORDER — FREE WATER
200.0000 mL | Status: DC
  Administered 2023-10-07 – 2023-10-08 (×6): 200 mL

## 2023-10-07 NOTE — Progress Notes (Signed)
 "STROKE TEAM PROGRESS NOTE    SIGNIFICANT HOSPITAL EVENTS  3/16: Admitted for defeat dysphagia, found to have multifocal pneumonia likely due to aspiration. 3/18: Developed right facial droop and right-sided weakness.  MRI shows acute right medullary infarct  3/19: Developed hypoxia and respiratory distress leading to cardiac arrest with 9-minute downtime followed by seizure episode.  Difficult airway was noted due to emesis.  Transferred to ICU.  Cerebellar EEG negative LTM EEG placed overnight  INTERIM HISTORY/SUBJECTIVE  No family at the bedside.  Patient remains intubated and still requiring ventilatory support for respiratory failure.  He is drowsy but can arouse and follows commands well and moves left side well but right side appears slightly weaker today with minimal movement in the right hand.Marland KitchenMarland KitchenHe had a transient episode of hypotension this morning which recovered quickly. Vital signs stable.  Patient failed SBT trial yesterday due to increased work of breathing will likely need tracheostomy.  ENT has been consulted for tracheostomy. CT head repeated this morning due to increased right-sided weakness shows no acute abnormality. OBJECTIVE  CBC    Component Value Date/Time   WBC 5.7 10/07/2023 0207   RBC 3.74 (L) 10/07/2023 0207   HGB 11.0 (L) 10/07/2023 0207   HGB 9.9 (L) 10/03/2023 1043   HCT 36.1 (L) 10/07/2023 0207   PLT 200 10/07/2023 0207   MCV 96.5 10/07/2023 0207   MCH 29.4 10/07/2023 0207   MCHC 30.5 10/07/2023 0207   RDW 14.7 10/07/2023 0207   LYMPHSABS 0.8 09/29/2023 0846   MONOABS 0.7 09/29/2023 0846   EOSABS 0.0 09/29/2023 0846   BASOSABS 0.0 09/29/2023 0846    BMET    Component Value Date/Time   NA 148 (H) 10/07/2023 0207   K 4.7 10/07/2023 1214   CL 110 10/07/2023 0207   CO2 34 (H) 10/07/2023 0207   GLUCOSE 222 (H) 10/07/2023 0207   BUN 57 (H) 10/07/2023 0207   CREATININE 1.59 (H) 10/07/2023 0207   CREATININE 1.03 05/22/2020 1128   CALCIUM 9.3  10/07/2023 0207   GFRNONAA 54 (L) 10/07/2023 0207   GFRNONAA 89 05/22/2020 1128    IMAGING past 24 hours No results found.   Vitals:   10/07/23 1000 10/07/23 1100 10/07/23 1200 10/07/23 1300  BP: 91/65  123/64 114/61  Pulse: 69 71 70 72  Resp: 12 12 12 14   Temp: 99.9 F (37.7 C) 100 F (37.8 C) (!) 100.6 F (38.1 C) (!) 100.6 F (38.1 C)  TempSrc:      SpO2: 100% 100% 100% 100%  Weight:      Height:         PHYSICAL EXAM General: Critically ill in no apparent distress Psych:  Mood and affect appropriate for situation CV: Regular rate and rhythm on monitor Respiratory:  Regular, unlabored respirations mechanical ventilator GI: Abdomen soft and nontender   NEURO:  Intubated, sedated.  Eyes are closed open opens to voice, following commands Cranial Nerves:  II: Pupils equal and reactive III, IV, VI: Midline, tracks bilaterally, blinks bilaterally VII: UTA due to ETT VIII: hearing intact to voice. IX, X: Cough and gag present. [id number] shrug 5/5. XII: UTA Motor/sensory: LUE: 4/5, RUE 2/ 5 LLE: 4/5 RLE 2-3/5 Coordination: Deferred Gait- deferred  Most Recent NIH: 8   ASSESSMENT/PLAN  [name] is a 48 y.o. adult with history of HIV, DM, HTN, HLD, seizures in the setting of severe hyperglycemia, current smoker who presented to ED due to inability to swallow for 5 days, admitted for  pnemonia. Neurology consulted 3/19 for dysphagia.  NIH on consult: 3.  Respiratory failure Cardiac arrest  Aspiration pneumonia  Developed hypoxia and respiratory distress leading to cardiac arrest with 9-minute downtime followed by seizure episode.  Difficult airway was noted due to emesis.  Intubated 3/19-> self-extubated 3/21-> re-intubated 3/21 Vent management per CCM Appreciate CCM assistance fentanyl and propofol off Precedex infusion at 0.8 mcg On rocephin and flagyl  Stroke:  right lateral medullary infarct, etiology: small or large vessel disease from  uncontrolled stroke risk factors    CT head x 2 no acute finding CTA head & neck no LVO, advanced for age arthrosclerosis L vertebral V4 segment and proximal basilar artery MRI  Acute Right Medullary Infarct. Cytotoxic edema with no hemorrhage or mass effect. 2D Echo: LVEF 60-65%, Grade I diastolic dysfunction LDL 95 HgbA1c 10.8 UDS positive for THC VTE prophylaxis -Lovenox No antithrombotic prior to admission, now on aspirin 81 mg daily and clopidogrel 75 mg daily.  For 3 months then aspirin alone Therapy recommendations:  Pending Disposition:  pending  Seizure  Post cardiac arrest seizure activity On keppra Now on LTM EEG LTM 3/21 This study is suggestive of mild to moderate diffuse encephalopathy. No seizures or epileptiform discharges were seen throughout the recording.  LTM 3/22 This study is suggestive of mild to moderate diffuse encephalopathy. No seizures or epileptiform discharges were seen throughout the recording. Will DC LTM today  Hx of Hypertension Now hypotensive Home meds:  lopressor Stable Levophed off since 3/20 Avoid hypotension, goal MAP >65 Long term BP goal normotensive  Hyperlipidemia Home meds:  none LDL 95, goal < 70 Add Lipitor 20mg  Continue statin on discharge  Diabetes type II Uncontrolled Home meds: Lantus insulin (previously noted noncompliance) HgbA1c 10.8, goal < 7.0 CBGs SSI Recommend close follow-up with PCP for better DM control  Tobacco Abuse Patient smokes 0.5 packs per day for 26 years      Ready to quit? N/A Nicotine replacement therapy provided  Substance Abuse UDS positive for  THC       Ready to quit? 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 These were not seen on 08/28/23 MRI  Other Active Problems Hx of HIV, Home Meds: Biktarvy, bactrim and diflucan   Hospital day # 11  Patient continues to require ventilator support for respiratory failure and has been difficult to wean and may require tracheostomy.  Critical care team  has decided on elective tracheostomy.   Patient's right hemiparesis appeared to have worsened but repeat CT this morning shows no acute abnormality.  Discussed with Dr. Denese Killings critical care medicine.  This patient is critically ill and at significant risk of neurological worsening, death and care requires constant monitoring of vital signs, hemodynamics,respiratory and cardiac monitoring, extensive review of multiple databases, frequent neurological assessment, discussion with family, other specialists and medical decision making of high complexity.I have made any additions or clarifications directly to the above note.This critical care time does not reflect procedure time, or teaching time or supervisory time of PA/NP/Med Resident etc but could involve care discussion time.  I spent 30 minutes of neurocritical care time  in the care of  this patient.               Delia Heady, MD Medical Director [facility] Pager: [phone] 10/07/2023 1:43 PM

## 2023-10-07 NOTE — Consult Note (Signed)
 Reason for Consult: Tracheostomy Referring Physician: Luciano Cutter, MD  Jeremiah Stevens is an 48 y.o. adult.  HPI: History of untreated HIV, multiple infections, multiple strokes, diabetes, poor compliance, admitted with pneumonia, probably aspiration pneumonia, intubated and multiple attempts at extubation have failed.  Interested in tracheostomy.  Past Medical History:  Diagnosis Date   Cancer (HCC)    seizures   Diabetes (HCC)    Diabetes mellitus without complication (HCC)    Heartburn    occasional; OTC as needed   Herpes genitalis in men    HIV (human immunodeficiency virus infection) (HCC)    HIV disease (HCC)    HTN (hypertension)    Hyperlipidemia    Hypertension    under control with med., has been on med. x 1 yr.   Lateral malleolar fracture 09/02/2013   left   Migraines    Tear of deltoid ligament of left ankle 09/02/2013   Type 2 diabetes mellitus with hyperosmolar nonketotic hyperglycemia (HCC) 02/10/2020    Past Surgical History:  Procedure Laterality Date   NO PAST SURGERIES     ORIF ANKLE FRACTURE Left 09/13/2013   Procedure: OPEN REDUCTION INTERNAL FIXATION (ORIF) LEFT LATERAL MALLEOLUS ANKLE FRACTURE ;  Surgeon: Dannielle Huh, MD;  Location: [facility];  Service: Orthopedics;  Laterality: Left;    Family History  Problem Relation Age of Onset   Hypertension Mother     Social History:  reports that she has been smoking cigarettes. She started smoking about 26 years ago. She has a 13.1 pack-year smoking history. She has never used smokeless tobacco. She reports that she does not currently use alcohol. She reports that she does not currently use drugs after having used the following drugs: Marijuana. Frequency: 7.00 times per week.  Allergies:  Allergies  Allergen Reactions   Penicillins Anaphylaxis   Peanut-Containing Drug Products Other (See Comments)    WALNUTS - SORES ON TONGUE   Sustiva [Efavirenz] Rash    Medications:  Reviewed  Results for orders placed or performed during the hospital encounter of 09/26/23 (from the past 48 hours)  Glucose, capillary     Status: Abnormal   Collection Time: 10/05/23  3:17 PM  Result Value Ref Range   Glucose-Capillary 180 (H) 70 - 99 mg/dL    Comment: Glucose reference range applies only to samples taken after fasting for at least 8 hours.  Glucose, capillary     Status: Abnormal   Collection Time: 10/05/23  7:52 PM  Result Value Ref Range   Glucose-Capillary 272 (H) 70 - 99 mg/dL    Comment: Glucose reference range applies only to samples taken after fasting for at least 8 hours.  Glucose, capillary     Status: Abnormal   Collection Time: 10/05/23 11:53 PM  Result Value Ref Range   Glucose-Capillary 327 (H) 70 - 99 mg/dL    Comment: Glucose reference range applies only to samples taken after fasting for at least 8 hours.  Glucose, capillary     Status: Abnormal   Collection Time: 10/06/23  3:52 AM  Result Value Ref Range   Glucose-Capillary 339 (H) 70 - 99 mg/dL    Comment: Glucose reference range applies only to samples taken after fasting for at least 8 hours.  Glucose, capillary     Status: Abnormal   Collection Time: 10/06/23  8:29 AM  Result Value Ref Range   Glucose-Capillary 278 (H) 70 - 99 mg/dL    Comment: Glucose reference range applies only  to samples taken after fasting for at least 8 hours.  Glucose, capillary     Status: Abnormal   Collection Time: 10/06/23 11:58 AM  Result Value Ref Range   Glucose-Capillary 173 (H) 70 - 99 mg/dL    Comment: Glucose reference range applies only to samples taken after fasting for at least 8 hours.  Basic metabolic panel     Status: Abnormal   Collection Time: 10/06/23 12:37 PM  Result Value Ref Range   Sodium 148 (H) 135 - 145 mmol/L   Potassium 5.1 3.5 - 5.1 mmol/L   Chloride 109 98 - 111 mmol/L   CO2 28 22 - 32 mmol/L   Glucose, Bld 177 (H) 70 - 99 mg/dL    Comment: Glucose reference range applies only to  samples taken after fasting for at least 8 hours.   BUN 62 (H) 6 - 20 mg/dL   Creatinine, Ser 4.01 (H) 0.61 - 1.24 mg/dL   Calcium 02.7 8.9 - 25.3 mg/dL   GFR, Estimated 57 (L) >60 mL/min    Comment: (NOTE) Calculated using the CKD-EPI Creatinine Equation (2021)    Anion gap 11 5 - 15    Comment: Performed at [facility], [address] [place], [place] [address]  Magnesium     Status: Abnormal   Collection Time: 10/06/23 12:37 PM  Result Value Ref Range   Magnesium 2.9 (H) 1.7 - 2.4 mg/dL    Comment: Performed at [facility], [address] [place], [place] [address]  Glucose, capillary     Status: Abnormal   Collection Time: 10/06/23  2:45 PM  Result Value Ref Range   Glucose-Capillary 136 (H) 70 - 99 mg/dL    Comment: Glucose reference range applies only to samples taken after fasting for at least 8 hours.  Glucose, capillary     Status: Abnormal   Collection Time: 10/06/23  7:29 PM  Result Value Ref Range   Glucose-Capillary 203 (H) 70 - 99 mg/dL    Comment: Glucose reference range applies only to samples taken after fasting for at least 8 hours.  Glucose, capillary     Status: Abnormal   Collection Time: 10/06/23 11:59 PM  Result Value Ref Range   Glucose-Capillary 178 (H) 70 - 99 mg/dL    Comment: Glucose reference range applies only to samples taken after fasting for at least 8 hours.  Basic metabolic panel     Status: Abnormal   Collection Time: 10/07/23  2:07 AM  Result Value Ref Range   Sodium 148 (H) 135 - 145 mmol/L   Potassium 5.5 (H) 3.5 - 5.1 mmol/L   Chloride 110 98 - 111 mmol/L   CO2 34 (H) 22 - 32 mmol/L   Glucose, Bld 222 (H) 70 - 99 mg/dL    Comment: Glucose reference range applies only to samples taken after fasting for at least 8 hours.   BUN 57 (H) 6 - 20 mg/dL   Creatinine, Ser 5.95 (H) 0.61 - 1.24 mg/dL   Calcium 9.3 8.9 - 63.8 mg/dL   GFR, Estimated 54 (L) >60 mL/min    Comment: (NOTE) Calculated using the CKD-EPI Creatinine  Equation (2021)    Anion gap 4 (L) 5 - 15    Comment: ELECTROLYTES REPEATED TO VERIFY Performed at [facility], [address] [place], [place] [address]   Magnesium     Status: Abnormal   Collection Time: 10/07/23  2:07 AM  Result Value Ref Range  Magnesium 2.8 (H) 1.7 - 2.4 mg/dL    Comment: Performed at [facility], [address], [place], [place] [address]  CBC     Status: Abnormal   Collection Time: 10/07/23  2:07 AM  Result Value Ref Range   WBC 5.7 4.0 - 10.5 K/uL   RBC 3.74 (L) 4.22 - 5.81 MIL/uL   Hemoglobin 11.0 (L) 13.0 - 17.0 g/dL   HCT 91.4 (L) 78.2 - 95.6 %   MCV 96.5 80.0 - 100.0 fL   MCH 29.4 26.0 - 34.0 pg   MCHC 30.5 30.0 - 36.0 g/dL   RDW 21.3 08.6 - 57.8 %   Platelets 200 150 - 400 K/uL   nRBC 0.0 0.0 - 0.2 %    Comment: Performed at [facility], [address], [place], [place] [address]  Glucose, capillary     Status: Abnormal   Collection Time: 10/07/23  3:35 AM  Result Value Ref Range   Glucose-Capillary 120 (H) 70 - 99 mg/dL    Comment: Glucose reference range applies only to samples taken after fasting for at least 8 hours.  Glucose, capillary     Status: Abnormal   Collection Time: 10/07/23  7:39 AM  Result Value Ref Range   Glucose-Capillary 220 (H) 70 - 99 mg/dL    Comment: Glucose reference range applies only to samples taken after fasting for at least 8 hours.  Glucose, capillary     Status: None   Collection Time: 10/07/23 11:46 AM  Result Value Ref Range   Glucose-Capillary 79 70 - 99 mg/dL    Comment: Glucose reference range applies only to samples taken after fasting for at least 8 hours.    No results found.  XBM:[id number] except as listed in admit H&P  Blood pressure 91/65, pulse 71, temperature 100 F (37.8 C), resp. rate 12, height 6' (1.829 m), weight 106.3 kg, SpO2 100%.  PHYSICAL EXAM: Overall appearance:  Healthy appearing, in no distress, orally intubated. Head:  Normocephalic, atraumatic. Ears:  External ears look healthy. Nose: External nose is healthy in appearance. Internal nasal exam free of any lesions or obstruction. Oral Cavity/Pharynx:  There are no mucosal lesions or masses identified. Larynx/Hypopharynx: Deferred Neuro: He is nonverbal since he is intubated.  He is not able to write anything trying to use his left hand, when he attempts to write it is illegible.. Neck: No palpable neck masses.  Studies Reviewed: none  Procedures: none   Assessment/Plan: Concerned about long-term ventilatory support.  Agree with need for tracheostomy.  His sister is making medical decisions.  Recommend when she gets here later today to call me and we will discuss this in more detail.  Medical Decision Making: #/Complex Problems: 4  Data Reviewed:4  Management:4 (1-Straightforward, 2-Low, 3-Moderate, 4-High)   I63.9 R06.03  Jeremiah Stevens 10/07/2023, 12:42 PM

## 2023-10-07 NOTE — Progress Notes (Signed)
 RN called to bedside for unresponsive episode during physical therapy. Unresponsive episode lasted roughly 30 seconds - 1 minute. O2 sats dropped to 82% on 40% fiO2 and 8 PEEP. MD, RN, and PT at bedside. Pt became alert and vitals remained stable. Head CT order. Continuing to monitor.

## 2023-10-07 NOTE — Progress Notes (Signed)
 Patient constantly pushing call bell, bangs on table, or bed rail. When nurse tries to address patient need. Patient becomes combative and angry. Contacted elink to see if sedation needs to be changed. Currently maxed on precedex at 1.8

## 2023-10-07 NOTE — TOC Progression Note (Signed)
 Transition of Care [facility]) - Progression Note    Patient Details  Name: Jeremiah Stevens MRN: [id number] Date of Birth: July 26, 1975  Transition of Care [facility]) CM/SW Contact  Marliss Coots, LCSW Phone Number: 10/07/2023, 12:03 PM  Clinical Narrative:     12:03 PM Per chart review, therapy recommended patient discharge to IP Rehab upon hospitalization but patient's insurance is out of network with [facility]. CSW submitted referrals to other IP Rehab facilities ([facility] and [facility]).  Expected Discharge Plan: IP Rehab Facility Barriers to Discharge: Continued Medical Work up  Expected Discharge Plan and Services In-house Referral: Clinical Social Work   Post Acute Care Choice: NA Living arrangements for the past 2 months: Single Family Home                 DME Arranged: N/A DME Agency: NA                   Social Determinants of Health (SDOH) Interventions SDOH Screenings   Food Insecurity: No Food Insecurity (09/27/2023)  Housing: Low Risk  (09/27/2023)  Transportation Needs: Unmet Transportation Needs (09/27/2023)  Utilities: Not At Risk (09/27/2023)  Depression (PHQ2-9): Low Risk  (05/22/2020)  Tobacco Use: High Risk (09/26/2023)    Readmission Risk Interventions     No data to display

## 2023-10-07 NOTE — Progress Notes (Signed)
 Pt was transported to CT scan and back to 61m04 without complications.

## 2023-10-07 NOTE — Plan of Care (Signed)
 Problem: Nutritional: Goal: Maintenance of adequate nutrition will improve 10/07/2023 0537 by Governor Rooks, Charmian Muff, RN Outcome: Progressing 10/07/2023 0533 by Governor Rooks, Charmian Muff, RN Outcome: Progressing Goal: Progress toward achieving an optimal weight will improve 10/07/2023 0537 by Governor Rooks, Charmian Muff, RN Outcome: Progressing 10/07/2023 0533 by Governor Rooks, Charmian Muff, RN Outcome: Progressing   Problem: Skin Integrity: Goal: Risk for impaired skin integrity will decrease 10/07/2023 0537 by Governor Rooks, Charmian Muff, RN Outcome: Progressing 10/07/2023 0533 by Governor Rooks, Charmian Muff, RN Outcome: Progressing   Problem: Tissue Perfusion: Goal: Adequacy of tissue perfusion will improve 10/07/2023 0537 by Linde Gillis, RN Outcome: Progressing 10/07/2023 0533 by Governor Rooks, Charmian Muff, RN Outcome: Progressing   Problem: Education: Goal: Knowledge of General Education information will improve Description: Including pain rating scale, medication(s)/side effects and non-pharmacologic comfort measures 10/07/2023 0537 by Governor Rooks, Charmian Muff, RN Outcome: Progressing 10/07/2023 0533 by Linde Gillis, RN Outcome: Not Progressing   Problem: Health Behavior/Discharge Planning: Goal: Ability to manage health-related needs will improve 10/07/2023 0537 by Linde Gillis, RN Outcome: Progressing 10/07/2023 0533 by Governor Rooks, Charmian Muff, RN Outcome: Progressing   Problem: Clinical Measurements: Goal: Ability to maintain clinical measurements within normal limits will improve 10/07/2023 0537 by Linde Gillis, RN Outcome: Progressing 10/07/2023 0533 by Governor Rooks, Charmian Muff, RN Outcome: Progressing Goal: Will remain free from infection 10/07/2023 0537 by Linde Gillis, RN Outcome: Progressing 10/07/2023 0533 by Governor Rooks, Charmian Muff, RN Outcome: Progressing Goal: Diagnostic test results will improve 10/07/2023 0537 by Linde Gillis, RN Outcome: Progressing 10/07/2023  0533 by Linde Gillis, RN Outcome: Not Progressing Goal: Respiratory complications will improve 10/07/2023 0537 by Linde Gillis, RN Outcome: Progressing 10/07/2023 0533 by Governor Rooks, Charmian Muff, RN Outcome: Progressing Goal: Cardiovascular complication will be avoided 10/07/2023 0537 by Linde Gillis, RN Outcome: Progressing 10/07/2023 0533 by Linde Gillis, RN Outcome: Progressing   Problem: Activity: Goal: Risk for activity intolerance will decrease 10/07/2023 0537 by Governor Rooks, Charmian Muff, RN Outcome: Progressing 10/07/2023 0533 by Governor Rooks, Charmian Muff, RN Outcome: Not Progressing   Problem: Nutrition: Goal: Adequate nutrition will be maintained 10/07/2023 0537 by Governor Rooks, Charmian Muff, RN Outcome: Progressing 10/07/2023 0533 by Governor Rooks, Charmian Muff, RN Outcome: Progressing   Problem: Coping: Goal: Level of anxiety will decrease 10/07/2023 0537 by Governor Rooks, Charmian Muff, RN Outcome: Progressing 10/07/2023 0533 by Governor Rooks, Charmian Muff, RN Outcome: Progressing   Problem: Elimination: Goal: Will not experience complications related to bowel motility 10/07/2023 0537 by Linde Gillis, RN Outcome: Progressing 10/07/2023 0533 by Governor Rooks, Charmian Muff, RN Outcome: Progressing Goal: Will not experience complications related to urinary retention 10/07/2023 0537 by Linde Gillis, RN Outcome: Progressing 10/07/2023 0533 by Governor Rooks, Charmian Muff, RN Outcome: Progressing   Problem: Pain Managment: Goal: General experience of comfort will improve and/or be controlled 10/07/2023 0537 by Linde Gillis, RN Outcome: Progressing 10/07/2023 0533 by Governor Rooks, Charmian Muff, RN Outcome: Progressing   Problem: Safety: Goal: Ability to remain free from injury will improve 10/07/2023 0537 by Linde Gillis, RN Outcome: Progressing 10/07/2023 0533 by Governor Rooks, Charmian Muff, RN Outcome: Progressing   Problem: Skin Integrity: Goal: Risk for impaired skin  integrity will decrease 10/07/2023 0537 by Linde Gillis, RN Outcome: Progressing 10/07/2023 0533 by Linde Gillis, RN Outcome: Progressing   Problem: Activity: Goal: Ability to tolerate increased activity will improve 10/07/2023 0537 by Governor Rooks, Charmian Muff, RN Outcome: Progressing 10/07/2023 0533 by Governor Rooks, Charmian Muff, RN Outcome: Progressing   Problem: Clinical Measurements: Goal: Ability to maintain a  body temperature in the normal range will improve 10/07/2023 0537 by Governor Rooks, Charmian Muff, RN Outcome: Progressing 10/07/2023 0533 by Governor Rooks, Charmian Muff, RN Outcome: Progressing   Problem: Respiratory: Goal: Ability to maintain adequate ventilation will improve 10/07/2023 0537 by Governor Rooks, Charmian Muff, RN Outcome: Progressing 10/07/2023 0533 by Governor Rooks, Charmian Muff, RN Outcome: Progressing Goal: Ability to maintain a clear airway will improve 10/07/2023 0537 by Linde Gillis, RN Outcome: Progressing 10/07/2023 0533 by Governor Rooks, Charmian Muff, RN Outcome: Progressing   Problem: Education: Goal: Knowledge of disease or condition will improve 10/07/2023 0537 by Linde Gillis, RN Outcome: Progressing 10/07/2023 0533 by Governor Rooks, Charmian Muff, RN Outcome: Progressing Goal: Knowledge of secondary prevention will improve (MUST DOCUMENT ALL) 10/07/2023 0537 by Linde Gillis, RN Outcome: Progressing 10/07/2023 0533 by Governor Rooks, Charmian Muff, RN Outcome: Progressing Goal: Knowledge of patient specific risk factors will improve (DELETE if not current risk factor) 10/07/2023 0537 by Linde Gillis, RN Outcome: Progressing 10/07/2023 0533 by Governor Rooks, Charmian Muff, RN Outcome: Progressing   Problem: Ischemic Stroke/TIA Tissue Perfusion: Goal: Complications of ischemic stroke/TIA will be minimized 10/07/2023 0537 by Governor Rooks, Charmian Muff, RN Outcome: Progressing 10/07/2023 0533 by Governor Rooks, Charmian Muff, RN Outcome: Progressing   Problem: Coping: Goal: Will  verbalize positive feelings about self 10/07/2023 0537 by Linde Gillis, RN Outcome: Progressing 10/07/2023 0533 by Governor Rooks, Charmian Muff, RN Outcome: Progressing Goal: Will identify appropriate support needs 10/07/2023 0537 by Linde Gillis, RN Outcome: Progressing 10/07/2023 0533 by Governor Rooks, Charmian Muff, RN Outcome: Progressing   Problem: Health Behavior/Discharge Planning: Goal: Ability to manage health-related needs will improve 10/07/2023 0537 by Linde Gillis, RN Outcome: Progressing 10/07/2023 0533 by Linde Gillis, RN Outcome: Progressing Goal: Goals will be collaboratively established with patient/family 10/07/2023 [phone] by Governor Rooks, Charmian Muff, RN Outcome: Progressing 10/07/2023 0533 by Governor Rooks, Charmian Muff, RN Outcome: Progressing   Problem: Self-Care: Goal: Ability to participate in self-care as condition permits will improve 10/07/2023 0537 by Linde Gillis, RN Outcome: Progressing 10/07/2023 0533 by Governor Rooks, Charmian Muff, RN Outcome: Progressing Goal: Verbalization of feelings and concerns over difficulty with self-care will improve 10/07/2023 0537 by Linde Gillis, RN Outcome: Progressing 10/07/2023 0533 by Governor Rooks, Charmian Muff, RN Outcome: Progressing Goal: Ability to communicate needs accurately will improve 10/07/2023 0537 by Linde Gillis, RN Outcome: Progressing 10/07/2023 0533 by Governor Rooks, Charmian Muff, RN Outcome: Progressing   Problem: Nutrition: Goal: Risk of aspiration will decrease 10/07/2023 0537 by Linde Gillis, RN Outcome: Progressing 10/07/2023 0533 by Governor Rooks, Charmian Muff, RN Outcome: Progressing Goal: Dietary intake will improve 10/07/2023 0537 by Linde Gillis, RN Outcome: Progressing 10/07/2023 0533 by Linde Gillis, RN Outcome: Progressing   Problem: Activity: Goal: Ability to tolerate increased activity will improve 10/07/2023 0537 by Governor Rooks, Charmian Muff, RN Outcome:  Progressing 10/07/2023 0533 by Governor Rooks, Charmian Muff, RN Outcome: Progressing   Problem: Respiratory: Goal: Ability to maintain a clear airway and adequate ventilation will improve 10/07/2023 0537 by Linde Gillis, RN Outcome: Progressing 10/07/2023 0533 by Governor Rooks, Charmian Muff, RN Outcome: Progressing   Problem: Role Relationship: Goal: Method of communication will improve 10/07/2023 0537 by Linde Gillis, RN Outcome: Progressing 10/07/2023 0533 by Linde Gillis, RN Outcome: Progressing

## 2023-10-07 NOTE — H&P (View-Only) (Signed)
 Reason for Consult: Tracheostomy Referring Physician: Luciano Cutter, MD  Jeremiah Stevens is an 48 y.o. adult.  HPI: History of untreated HIV, multiple infections, multiple strokes, diabetes, poor compliance, admitted with pneumonia, probably aspiration pneumonia, intubated and multiple attempts at extubation have failed.  Interested in tracheostomy.  Past Medical History:  Diagnosis Date   Cancer (HCC)    seizures   Diabetes (HCC)    Diabetes mellitus without complication (HCC)    Heartburn    occasional; OTC as needed   Herpes genitalis in men    HIV (human immunodeficiency virus infection) (HCC)    HIV disease (HCC)    HTN (hypertension)    Hyperlipidemia    Hypertension    under control with med., has been on med. x 1 yr.   Lateral malleolar fracture 09/02/2013   left   Migraines    Tear of deltoid ligament of left ankle 09/02/2013   Type 2 diabetes mellitus with hyperosmolar nonketotic hyperglycemia (HCC) 02/10/2020    Past Surgical History:  Procedure Laterality Date   NO PAST SURGERIES     ORIF ANKLE FRACTURE Left 09/13/2013   Procedure: OPEN REDUCTION INTERNAL FIXATION (ORIF) LEFT LATERAL MALLEOLUS ANKLE FRACTURE ;  Surgeon: Dannielle Huh, MD;  Location: [facility];  Service: Orthopedics;  Laterality: Left;    Family History  Problem Relation Age of Onset   Hypertension Mother     Social History:  reports that she has been smoking cigarettes. She started smoking about 26 years ago. She has a 13.1 pack-year smoking history. She has never used smokeless tobacco. She reports that she does not currently use alcohol. She reports that she does not currently use drugs after having used the following drugs: Marijuana. Frequency: 7.00 times per week.  Allergies:  Allergies  Allergen Reactions   Penicillins Anaphylaxis   Peanut-Containing Drug Products Other (See Comments)    WALNUTS - SORES ON TONGUE   Sustiva [Efavirenz] Rash    Medications:  Reviewed  Results for orders placed or performed during the hospital encounter of 09/26/23 (from the past 48 hours)  Glucose, capillary     Status: Abnormal   Collection Time: 10/05/23  3:17 PM  Result Value Ref Range   Glucose-Capillary 180 (H) 70 - 99 mg/dL    Comment: Glucose reference range applies only to samples taken after fasting for at least 8 hours.  Glucose, capillary     Status: Abnormal   Collection Time: 10/05/23  7:52 PM  Result Value Ref Range   Glucose-Capillary 272 (H) 70 - 99 mg/dL    Comment: Glucose reference range applies only to samples taken after fasting for at least 8 hours.  Glucose, capillary     Status: Abnormal   Collection Time: 10/05/23 11:53 PM  Result Value Ref Range   Glucose-Capillary 327 (H) 70 - 99 mg/dL    Comment: Glucose reference range applies only to samples taken after fasting for at least 8 hours.  Glucose, capillary     Status: Abnormal   Collection Time: 10/06/23  3:52 AM  Result Value Ref Range   Glucose-Capillary 339 (H) 70 - 99 mg/dL    Comment: Glucose reference range applies only to samples taken after fasting for at least 8 hours.  Glucose, capillary     Status: Abnormal   Collection Time: 10/06/23  8:29 AM  Result Value Ref Range   Glucose-Capillary 278 (H) 70 - 99 mg/dL    Comment: Glucose reference range applies only  to samples taken after fasting for at least 8 hours.  Glucose, capillary     Status: Abnormal   Collection Time: 10/06/23 11:58 AM  Result Value Ref Range   Glucose-Capillary 173 (H) 70 - 99 mg/dL    Comment: Glucose reference range applies only to samples taken after fasting for at least 8 hours.  Basic metabolic panel     Status: Abnormal   Collection Time: 10/06/23 12:37 PM  Result Value Ref Range   Sodium 148 (H) 135 - 145 mmol/L   Potassium 5.1 3.5 - 5.1 mmol/L   Chloride 109 98 - 111 mmol/L   CO2 28 22 - 32 mmol/L   Glucose, Bld 177 (H) 70 - 99 mg/dL    Comment: Glucose reference range applies only to  samples taken after fasting for at least 8 hours.   BUN 62 (H) 6 - 20 mg/dL   Creatinine, Ser 4.01 (H) 0.61 - 1.24 mg/dL   Calcium 02.7 8.9 - 25.3 mg/dL   GFR, Estimated 57 (L) >60 mL/min    Comment: (NOTE) Calculated using the CKD-EPI Creatinine Equation (2021)    Anion gap 11 5 - 15    Comment: Performed at [facility], [address] [place], [place] [address]  Magnesium     Status: Abnormal   Collection Time: 10/06/23 12:37 PM  Result Value Ref Range   Magnesium 2.9 (H) 1.7 - 2.4 mg/dL    Comment: Performed at [facility], [address] [place], [place] [address]  Glucose, capillary     Status: Abnormal   Collection Time: 10/06/23  2:45 PM  Result Value Ref Range   Glucose-Capillary 136 (H) 70 - 99 mg/dL    Comment: Glucose reference range applies only to samples taken after fasting for at least 8 hours.  Glucose, capillary     Status: Abnormal   Collection Time: 10/06/23  7:29 PM  Result Value Ref Range   Glucose-Capillary 203 (H) 70 - 99 mg/dL    Comment: Glucose reference range applies only to samples taken after fasting for at least 8 hours.  Glucose, capillary     Status: Abnormal   Collection Time: 10/06/23 11:59 PM  Result Value Ref Range   Glucose-Capillary 178 (H) 70 - 99 mg/dL    Comment: Glucose reference range applies only to samples taken after fasting for at least 8 hours.  Basic metabolic panel     Status: Abnormal   Collection Time: 10/07/23  2:07 AM  Result Value Ref Range   Sodium 148 (H) 135 - 145 mmol/L   Potassium 5.5 (H) 3.5 - 5.1 mmol/L   Chloride 110 98 - 111 mmol/L   CO2 34 (H) 22 - 32 mmol/L   Glucose, Bld 222 (H) 70 - 99 mg/dL    Comment: Glucose reference range applies only to samples taken after fasting for at least 8 hours.   BUN 57 (H) 6 - 20 mg/dL   Creatinine, Ser 5.95 (H) 0.61 - 1.24 mg/dL   Calcium 9.3 8.9 - 63.8 mg/dL   GFR, Estimated 54 (L) >60 mL/min    Comment: (NOTE) Calculated using the CKD-EPI Creatinine  Equation (2021)    Anion gap 4 (L) 5 - 15    Comment: ELECTROLYTES REPEATED TO VERIFY Performed at [facility], [address] [place], [place] [address]   Magnesium     Status: Abnormal   Collection Time: 10/07/23  2:07 AM  Result Value Ref Range  Magnesium 2.8 (H) 1.7 - 2.4 mg/dL    Comment: Performed at [facility], [address], [place], [place] [address]  CBC     Status: Abnormal   Collection Time: 10/07/23  2:07 AM  Result Value Ref Range   WBC 5.7 4.0 - 10.5 K/uL   RBC 3.74 (L) 4.22 - 5.81 MIL/uL   Hemoglobin 11.0 (L) 13.0 - 17.0 g/dL   HCT 91.4 (L) 78.2 - 95.6 %   MCV 96.5 80.0 - 100.0 fL   MCH 29.4 26.0 - 34.0 pg   MCHC 30.5 30.0 - 36.0 g/dL   RDW 21.3 08.6 - 57.8 %   Platelets 200 150 - 400 K/uL   nRBC 0.0 0.0 - 0.2 %    Comment: Performed at [facility], [address], [place], [place] [address]  Glucose, capillary     Status: Abnormal   Collection Time: 10/07/23  3:35 AM  Result Value Ref Range   Glucose-Capillary 120 (H) 70 - 99 mg/dL    Comment: Glucose reference range applies only to samples taken after fasting for at least 8 hours.  Glucose, capillary     Status: Abnormal   Collection Time: 10/07/23  7:39 AM  Result Value Ref Range   Glucose-Capillary 220 (H) 70 - 99 mg/dL    Comment: Glucose reference range applies only to samples taken after fasting for at least 8 hours.  Glucose, capillary     Status: None   Collection Time: 10/07/23 11:46 AM  Result Value Ref Range   Glucose-Capillary 79 70 - 99 mg/dL    Comment: Glucose reference range applies only to samples taken after fasting for at least 8 hours.    No results found.  XBM:[id number] except as listed in admit H&P  Blood pressure 91/65, pulse 71, temperature 100 F (37.8 C), resp. rate 12, height 6' (1.829 m), weight 106.3 kg, SpO2 100%.  PHYSICAL EXAM: Overall appearance:  Healthy appearing, in no distress, orally intubated. Head:  Normocephalic, atraumatic. Ears:  External ears look healthy. Nose: External nose is healthy in appearance. Internal nasal exam free of any lesions or obstruction. Oral Cavity/Pharynx:  There are no mucosal lesions or masses identified. Larynx/Hypopharynx: Deferred Neuro: He is nonverbal since he is intubated.  He is not able to write anything trying to use his left hand, when he attempts to write it is illegible.. Neck: No palpable neck masses.  Studies Reviewed: none  Procedures: none   Assessment/Plan: Concerned about long-term ventilatory support.  Agree with need for tracheostomy.  His sister is making medical decisions.  Recommend when she gets here later today to call me and we will discuss this in more detail.  Medical Decision Making: #/Complex Problems: 4  Data Reviewed:4  Management:4 (1-Straightforward, 2-Low, 3-Moderate, 4-High)   I63.9 R06.03  Serena Colonel 10/07/2023, 12:42 PM

## 2023-10-07 NOTE — Progress Notes (Signed)
 eLink Physician-Brief Progress Note Patient Name: Jeremiah Stevens DOB: April 22, 1976 MRN: [id number]   Date of Service  10/07/2023  HPI/Events of Note  Patient needs restraints to prevent self extubation.  eICU Interventions  Restraints ordered.        Migdalia Dk 10/07/2023, 10:21 PM

## 2023-10-07 NOTE — Plan of Care (Signed)
  Problem: Health Behavior/Discharge Planning: Goal: Ability to identify and utilize available resources and services will improve Outcome: Progressing Goal: Ability to manage health-related needs will improve Outcome: Progressing   Problem: Nutritional: Goal: Maintenance of adequate nutrition will improve Outcome: Progressing Goal: Progress toward achieving an optimal weight will improve Outcome: Progressing   Problem: Skin Integrity: Goal: Risk for impaired skin integrity will decrease Outcome: Progressing   Problem: Tissue Perfusion: Goal: Adequacy of tissue perfusion will improve Outcome: Progressing   Problem: Health Behavior/Discharge Planning: Goal: Ability to manage health-related needs will improve Outcome: Progressing   Problem: Clinical Measurements: Goal: Ability to maintain clinical measurements within normal limits will improve Outcome: Progressing Goal: Will remain free from infection Outcome: Progressing Goal: Respiratory complications will improve Outcome: Progressing Goal: Cardiovascular complication will be avoided Outcome: Progressing   Problem: Nutrition: Goal: Adequate nutrition will be maintained Outcome: Progressing   Problem: Coping: Goal: Level of anxiety will decrease Outcome: Progressing   Problem: Elimination: Goal: Will not experience complications related to bowel motility Outcome: Progressing Goal: Will not experience complications related to urinary retention Outcome: Progressing   Problem: Pain Managment: Goal: General experience of comfort will improve and/or be controlled Outcome: Progressing   Problem: Safety: Goal: Ability to remain free from injury will improve Outcome: Progressing   Problem: Skin Integrity: Goal: Risk for impaired skin integrity will decrease Outcome: Progressing   Problem: Activity: Goal: Ability to tolerate increased activity will improve Outcome: Progressing   Problem: Clinical  Measurements: Goal: Ability to maintain a body temperature in the normal range will improve Outcome: Progressing   Problem: Respiratory: Goal: Ability to maintain adequate ventilation will improve Outcome: Progressing Goal: Ability to maintain a clear airway will improve Outcome: Progressing   Problem: Education: Goal: Knowledge of disease or condition will improve Outcome: Progressing Goal: Knowledge of secondary prevention will improve (MUST DOCUMENT ALL) Outcome: Progressing Goal: Knowledge of patient specific risk factors will improve (DELETE if not current risk factor) Outcome: Progressing   Problem: Ischemic Stroke/TIA Tissue Perfusion: Goal: Complications of ischemic stroke/TIA will be minimized Outcome: Progressing   Problem: Coping: Goal: Will verbalize positive feelings about self Outcome: Progressing Goal: Will identify appropriate support needs Outcome: Progressing   Problem: Health Behavior/Discharge Planning: Goal: Ability to manage health-related needs will improve Outcome: Progressing Goal: Goals will be collaboratively established with patient/family Outcome: Progressing   Problem: Self-Care: Goal: Ability to participate in self-care as condition permits will improve Outcome: Progressing Goal: Verbalization of feelings and concerns over difficulty with self-care will improve Outcome: Progressing Goal: Ability to communicate needs accurately will improve Outcome: Progressing   Problem: Nutrition: Goal: Risk of aspiration will decrease Outcome: Progressing Goal: Dietary intake will improve Outcome: Progressing   Problem: Activity: Goal: Ability to tolerate increased activity will improve Outcome: Progressing   Problem: Respiratory: Goal: Ability to maintain a clear airway and adequate ventilation will improve Outcome: Progressing   Problem: Role Relationship: Goal: Method of communication will improve Outcome: Progressing   Problem:  Metabolic: Goal: Ability to maintain appropriate glucose levels will improve Outcome: Not Progressing   Problem: Education: Goal: Knowledge of General Education information will improve Description: Including pain rating scale, medication(s)/side effects and non-pharmacologic comfort measures Outcome: Not Progressing   Problem: Clinical Measurements: Goal: Diagnostic test results will improve Outcome: Not Progressing   Problem: Activity: Goal: Risk for activity intolerance will decrease Outcome: Not Progressing

## 2023-10-07 NOTE — Progress Notes (Signed)
 NAME:  Jeremiah Stevens, MRN:  [id number], DOB:  [birth year], LOS: 11 ADMISSION DATE:  09/26/2023, CONSULTATION DATE:  09/29/23 REFERRING MD:  Rennis Chris CHIEF COMPLAINT:  Hypoxia   History of Present Illness:  Pt is encephelopathic; therefore, this HPI is obtained from chart review. Jeremiah Stevens is a 48 y.o. adult who has a PMH as below including but not limited to DM2, seizures, HIV AIDS untreated, cryptococcal disease, obesity, polysubstance abuse, prior CVA. She was admitted 09/26/23 with dysphagia x 3 days along with headaches and drooling. He was found to be hypoxic to 80s on room air.  CTA was negative for PE but showed multifocal PNA. He was started on abx and BD's. Biktarvy was continued and ID was consulted.   3/18 He had right facial droop, right sided weakness, dysarthria. Neurology was consulted and recommended CTA and MRI. MRI demonstrated acute right medullary infarct.  Despite ongoing dysphagia (MBS showed moderately severe pharyngeal phase dysphagia and imaging showed unilateral bulging on right), pt insisted on eating. He reportedly ordered pizza overnight 3/18 and certainly aspirated. He had threatened to leave AMA despite stroke workup ongoing.  3/19, he had hypoxia for which rapid response was called. While being assess, he continued to desaturate down into the 60s and then 40s. He required BVM and was transferred to the ICU for further evaluation and management. Upon arrival in the ICU, she had ongoing desaturations and bradycardia before developing PEA arrest.  She had 9 minutes ACLS prior to ROSC including epi x 4 and bicarb x 2. During intubation attempt, she had copious food material and emesis in her oropharynx. Visualization of vocal cords was difficult due to amount of food material and emesis despite suctioning. On 2nd attempt, ETT was passed successfully and saturations improved shortly thereafter. There was grand mal seizure activity noted after intubation that  abated after Midazolam administration.  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[facility]); Non-suicidal depressed mood; Gender dysphoria; RLL pneumonia; AKI (acute kidney injury) (HCC); Uncontrolled type 2 diabetes mellitus with hyperglycemia, without long-term current use of insulin (HCC); Tobacco abuse; HIV infection (HCC); Positive RPR test; Hyperosmolar hyperglycemic state (HHS) (HCC); Seizure (HCC); Acute hypoxic respiratory failure (HCC); Acute encephalopathy; Hyperammonemia (HCC); Pancytopenia (HCC); Cryptococcosis (HCC); CAP (community acquired pneumonia); Globus sensation; Aspiration pneumonia of both lungs due to vomit (HCC); Cardiac arrest, cause unspecified (HCC); Cerebrovascular accident (CVA) (HCC); Seizures (HCC); and 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  Significant Hospital Events: Including procedures, antibiotic start and stop dates in addition to other pertinent events   3/16 admit 3/18 neuro consult, found to have acute CVA 3/19 Ongoing aspiration despite obvious dysphagia, was planning to leave AMA but sister talked pt into staying. then had a massive aspiration event leading to worse hypoxia and PEA arrest. Txf to [facility] 3/20 PSV cumulative total of 5-6 hrs, following some commands/ purposefully 3/21 self extubated, was reintubated 3/23 Failed SBT due to WOB 3/24 Failed SBT due to apnea 3/25 Tolerated PS >4 hours 3/26: Intubated.  Interim History / Subjective:  Overnight patient was agitated.  This morning,  he had fevers to 101.4, also had < 1 min of being mildly responsive while working with PT, BP and SpO2 were stable. Objective:  Blood pressure 120/76, pulse 74, temperature (!) 101.1 F (38.4 C), temperature source Esophageal, resp. rate 15, height 6' (1.829 m), weight 106.3 kg, SpO2 100%.  Vent Mode: PRVC FiO2 (%):  [40 %]  40 % Set Rate:  [12 bmp] 12 bmp Vt Set:  [470 mL] 470 mL PEEP:  [8 cmH20] 8 cmH20 Pressure Support:  [10 cmH20-16 cmH20] 10 cmH20 Plateau Pressure:  [17 cmH20-21 cmH20] 18 cmH20   Intake/Output Summary (Last 24 hours) at 10/07/2023 0814 Last data filed at 10/07/2023 0800 Gross per 24 hour  Intake 3076.45 ml  Output 1650 ml  Net 1426.45 ml   Filed Weights   10/05/23 0151 10/06/23 1448 10/07/23 0456  Weight: 103.1 kg 108.7 kg 106.3 kg   Physical Exam: General: Chronically ill-appearing, no acute distress HENT: [place], AT, ETT in place Eyes: EOMI, no scleral icterus Respiratory: Clear to auscultation bilaterally.  No crackles, wheezing or rales Cardiovascular: RRR, -M/R/G, no JVD GI: BS+, soft, nontender Extremities:-Edema,-tenderness Neuro: Awake and alert, nods head appropriately, CNII-XII grossly intact,  moves extremities x 4. BUE/BLE 4/5 GU: External foley in place   Imaging, labs and test in EMR in the last 24 hours reviewed independently by me. Pertinent findings below:  3/27 WBC 5.7 SCR 1.59 Potassium 5.1 >>5.5 Na 148 Fasting glucose 220  Pneumocystis smear Resp culture 3/20 normal flora  Labs/imaging personally reviewed:  CTA chest 3/16 > no PE, multifocal PNA. CT head 3/18 > neg. MRI brain 3/19 > acute right medullary infarct, cytotoxic edema with no hemorrhage or mass effect, small chronic right cerebellar infarcts. CTA head/neck 3/19 > no LVO, advanced for age atherosclerosis of L vertebral artery and prox basilar artery, possible mod L V4 stenosis.   Assessment & Plan:   Acute hypoxic respiratory failure Multifocal PNA, CAP  Possible pneumocystis pneumonia Aspiration event Dysphagia  Tobacco use  Difficulty weaning off ventilator due to medullary stroke.  ENT consulted for tracheostomy.  Intermittent fevers this morning, likely secondary to presumptive PCP, currently on primaquine +clindamycin.  Not on Bactrim due to renal function.  -ENT consulted.   -Continue vent. - F/u Pneumocystis smear and PCR from tracheal aspirate - LTVV, 4-8cc/kg IBW with goal Pplat<30 and DP<15 - Wean sedation. precedex and PRN fentanyl for sedation - SBT/WUA. Extubate if passes - VAP, pulm hygiene  Acute R medullary infarct, cytotoxic edema Hx small R cerebellar infarcts, R PICA territory infarct  Encephalopathy, multifactorial P - Stroke following, cont ASA, plavix, statin - secondary w/u per stroke service  - PT/ OT  PEA arrest Sz like activity  -reportedly purposeful movements 3/20  P -cEEG per neuro, stopped 3/22> no seizures noted -keppra loaded 3/19, now on BID keppra 500mg . Will discuss with Neuro regarding if able to DC -neuro following -supportive care post arrest, avoid fevers   HFpEF Hx HTN HLD P - Cw diltiazem. - Cw metop 25 BID - cont to monitor hemodynamics, pending dex response, consider add back pta meds - statin, as above   AKI   Mild uptrend SCr 1.5 to >>1.59, K5.5.  Good UOP.  Hypernatremia, free water deficit of about 3.6L  - Free water per tube. - Monitor UOP/Cr - hemodynamic support - cont foley, strict I/Os, avoid nephrotoxins  Normocytic anemia - trend CBC  HIV / AIDS Hx +cryptococcal antigen  VL 3410 CD4 36 P - ID following - descovy per tube - fluconazole 400mg  daily - F/u PCP smear and PCR - Primaquine and clindamycin for PCP coverage  DM2, with hyperglycemia P - Resistant SSI - semglee 30 units BID - 8 units q4hr TF coverage  Anemia, normocytic, mild - monitor  Hx provoked sz (hyperglycemia/  hypoxemia)  Seizure like activity following PEA arrest 3/19  P - AEDs as above  Hyperthyroidism r/o  -TSH low T3 normal, T4 normal   THC use  - cessation counseling when able  Code status - iPAL note 3/19 - DNR status - 3/21 patient self extubated and family reversed to full code - 3/25. Patient and sister agreed to plan for extubation and re-intubation. Would want trach if indicated in the  future  Best practice (evaluated daily):  Diet/type: NPO; Hold TF  DVT prophylaxis: LMWH Pressure ulcer(s): pressure ulcer assessment deferred  GI prophylaxis: PPI Lines: N/A Foley:  Yes, and it is still needed Code Status:  full code Last date of multidisciplinary goals of care discussion: 3/25. Patient and sister agreed to plan for extubation and re-intubation. Would want trach if indicated in the future  Laretta Bolster, M.D.  Internal Medicine Resident, PGY-1 [facility]  Pager: [phone] 8:14 AM, 10/07/2023

## 2023-10-07 NOTE — Progress Notes (Signed)
 [facility]  Date of Admission:  09/26/2023      Total days of antibiotics 5   ASSESSMENT: Jeremiah Stevens is a 48 y.o. adult admitted with:   Acute CVA 3/18 -  Seizure -  Continues to receive treatment for possible pneumocystis pneumonia with sputum studies pending including a pneumocystis PCR.  She appears to be moving extremities well.  She is able to write to communicate though legibility is difficult.   Hypoxia / Respiratory Failure -  Multifocal Pneumonia -  PEA Arrest 3/19 -  Care per PCCM team appreciated. She is participating in daily weaning attempts. Had previously self extubated on 3/21 with immediate reintubation.  Her LDH is normal, suspect more of aspiration component not PJP. Sputum samples x 2 with only normal respiratory flora.  -Will continue treatment with clindamycin + primaquine and steroid taper while we wait on PJP PCR results. She continues to have some intermittent fevers.  -Consider full RVP should she have ongoing fevers - would expect if this was PJP would cool off on treatment (now day 5 today)   HIV, AIDS - CD4 36 -  Continue Tivicay + Descovy so can be crushed VT while she requires a core track VL 1.3 million last month >> still over 3000.  G6PD is normal -continue Tivicay + Descovy together crushed VT.  -We will repeat another viral load in the morning now that she has had observed therapy for a week since her last level.   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 Given IV for now.  Per 2010 IDSA guidelines she may need ongoing consolidative therapy until immune reconstitution.   PCN Allergy (Anaphylaxis) -  Tolerated ceftriaxone fine.   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)   Uncontrolled type 2 diabetes mellitus with hyperglycemia, without long-term current use of insulin (HCC)   Tobacco abuse   Seizure (HCC)   Acute hypoxic respiratory failure (HCC)   Globus sensation   Aspiration pneumonia of both lungs due to vomit [facility])   Cardiac arrest, cause unspecified (HCC)   Cerebrovascular accident (CVA) (HCC)   Seizures (HCC)   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)    aspirin  81 mg Per Tube Daily   atorvastatin  20 mg Per Tube Daily   Chlorhexidine Gluconate Cloth  6 each Topical Daily   clopidogrel  75 mg Per Tube Daily   diltiazem  60 mg Per Tube Q6H   dolutegravir  50 mg Per Tube Daily   emtricitabine-tenofovir AF  1 tablet Per Tube Daily   enoxaparin (LOVENOX) injection  50 mg Subcutaneous Q24H   famotidine  20 mg Per Tube Daily   feeding supplement (PROSource TF20)  60 mL Per Tube Daily   free water  200 mL Per Tube Q4H   insulin aspart  0-20 Units Subcutaneous Q4H   insulin aspart  20 Units Subcutaneous Q4H   insulin glargine-yfgn  60 Units Subcutaneous  BID   [START ON 10/08/2023] methylPREDNISolone (SOLU-MEDROL) injection  40 mg Intravenous Daily   Followed by   Melene Muller ON 10/13/2023] methylPREDNISolone (SOLU-MEDROL) injection  20 mg Intravenous Daily   metoprolol tartrate  25 mg Per Tube BID   multivitamin with minerals  1 tablet Per Tube Daily   nicotine  14 mg Transdermal Daily   mouth rinse  15 mL Mouth Rinse Q2H   pneumococcal 20-valent conjugate vaccine  0.5 mL Intramuscular Tomorrow-1000   polyethylene glycol  17 g Per Tube Daily   primaquine  30 mg Per NG tube Daily   senna  2 tablet Per Tube QHS    SUBJECTIVE: Intubated and sedated   Review of Systems: ROS unable to perform with  sedation / intubation    Allergies  Allergen Reactions   Penicillins Anaphylaxis   Peanut-Containing Drug Products Other (See Comments)    WALNUTS - SORES ON TONGUE   Sustiva [Efavirenz] Rash    OBJECTIVE: Vitals:   10/07/23 0902 10/07/23 0903 10/07/23 1000 10/07/23 1100  BP:  113/66 91/65   Pulse: 65 70 69 71  Resp: 15 15 12 12   Temp: (!) 101.1 F (38.4 C) (!) 101.1 F (38.4 C) 99.9 F (37.7 C) 100 F (37.8 C)  TempSrc:      SpO2: (!) 87% 99% 100% 100%  Weight:      Height:       Body mass index is 31.78 kg/m.  Physical Exam Constitutional:      Appearance: She is ill-appearing.  Eyes:     Comments: Eyes closed. EEG leads in place  Cardiovascular:     Rate and Rhythm: Normal rate and regular rhythm.  Pulmonary:     Effort: Pulmonary effort is normal. No respiratory distress.     Breath sounds: Normal breath sounds. No wheezing.  Abdominal:     General: Bowel sounds are normal. There is no distension.     Palpations: Abdomen is soft.  Musculoskeletal:        General: Swelling present.  Skin:    General: Skin is warm.     Lab Results Lab Results  Component Value Date   WBC 5.7 10/07/2023   HGB 11.0 (L) 10/07/2023   HCT 36.1 (L) 10/07/2023   MCV 96.5 10/07/2023   PLT 200 10/07/2023    Lab Results  Component Value Date   CREATININE 1.59 (H) 10/07/2023   BUN 57 (H) 10/07/2023   NA 148 (H) 10/07/2023   K 5.5 (H) 10/07/2023   CL 110 10/07/2023   CO2 34 (H) 10/07/2023    Lab Results  Component Value Date   ALT 31 09/29/2023   AST 18 09/29/2023   ALKPHOS 71 09/29/2023   BILITOT 0.6 09/29/2023     Microbiology: Recent Results (from the past 240 hours)  Culture, Respiratory w Gram Stain     Status: None   Collection Time: 09/29/23  5:08 PM   Specimen: Tracheal Aspirate; Respiratory  Result Value Ref Range Status   Specimen Description   Final    TRACHEAL ASPIRATE Performed at [facility], [address], [place],  [place] [address]    Special Requests   Final    NONE Performed at [facility], [address] [place], [place] [address]    Gram Stain NO WBC SEEN NO ORGANISMS SEEN   Final   Culture   Final    RARE Normal respiratory flora-no Staph aureus or Pseudomonas seen Performed at [facility], [address], [place], [place] [address]    Report Status 10/02/2023 FINAL  Final  Culture, Respiratory w Gram Stain     Status: None   Collection Time: 09/30/23  1:17 AM   Specimen: Sputum; Respiratory  Result Value Ref Range Status   Specimen Description SPU  Final   Special Requests Immunocompromised  Final   Gram Stain   Final    MODERATE WBC PRESENT, PREDOMINANTLY PMN FEW GRAM POSITIVE COCCI    Culture   Final    Normal respiratory flora-no Staph aureus or Pseudomonas seen Performed at [facility], [address] [place], [place] [address]    Report Status 10/05/2023 FINAL  Final  MRSA Next Gen by PCR, Nasal     Status: None   Collection Time: 09/30/23  1:49 AM   Specimen: Nasal Mucosa; Nasal Swab  Result Value Ref Range Status   MRSA by PCR Next Gen NOT DETECTED NOT DETECTED Final    Comment: (NOTE) The GeneXpert MRSA Assay (FDA approved for NASAL specimens only), is one component of a comprehensive MRSA colonization surveillance program. It is not intended to diagnose MRSA infection nor to guide or monitor treatment for MRSA infections. Test performance is not FDA approved in patients less than 77 years old. Performed at [facility], [address] [place], [place] [address]     Rexene Alberts, MSN, NP-C [facility]  [email or web address] Pager: [phone] Office: [phone] [facility] Main Line: [phone] *Secure Chat Communication Welcome

## 2023-10-07 NOTE — [place] FL2 (Signed)
 [place] MEDICAID FL2 LEVEL OF CARE FORM     IDENTIFICATION  Patient Name: Jeremiah Stevens Birthdate: 1975-11-09 Sex: adult Admission Date (Current Location): 09/26/2023  [facility] and [place] Number:  [employer] and Address:  [facility] [place], 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) 10/05/2023   Aspiration pneumonia of both lungs due to vomit (HCC) 09/29/2023   Cardiac arrest, cause unspecified (HCC) 09/29/2023   Cerebrovascular accident (CVA) (HCC) 09/29/2023   Seizures (HCC) 09/29/2023   CAP (community acquired pneumonia) 09/26/2023   Globus sensation 09/26/2023   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   Seizure (HCC) 08/26/2023   Acute hypoxic respiratory failure (HCC) 08/26/2023   Acute encephalopathy 08/26/2023   Hyperammonemia (HCC) 08/26/2023   Pancytopenia (HCC) 08/26/2023   Hyperosmolar hyperglycemic state (HHS) (HCC) 08/25/2023   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   HIV infection (HCC) 08/03/2022   RLL pneumonia 08/02/2022   AKI (acute kidney injury) (HCC) 08/02/2022   Uncontrolled type 2 diabetes mellitus with hyperglycemia, without long-term current use of insulin (HCC) 08/02/2022   Tobacco abuse 08/02/2022   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   Non-suicidal depressed mood 07/01/2018   Migraine 08/02/2013   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   Human immunodeficiency virus (HIV) disease 10/27/2011   HTN (hypertension) 10/27/2011   Hypertriglyceridemia  10/27/2011   Herpes 10/27/2011    Orientation RESPIRATION BLADDER Height & Weight        Vent (8L oxygen 40% FiO2; Trached; Was recently intubated) Continent, External catheter Weight: 234 lb 5.6 oz (106.3 kg) Height:  6' (182.9 cm)  BEHAVIORAL SYMPTOMS/MOOD NEUROLOGICAL BOWEL NUTRITION STATUS    Convulsions/Seizures (History of seizures) Incontinent Diet (Please see dc summary)  AMBULATORY STATUS COMMUNICATION OF NEEDS Skin   Extensive Assist Verbally Normal                       Personal Care Assistance Level of Assistance  Bathing, Feeding, Dressing Bathing Assistance: Maximum assistance Feeding assistance: Maximum assistance Dressing Assistance: Maximum assistance     Functional Limitations Info             SPECIAL CARE FACTORS FREQUENCY  OT (By licensed OT), PT (By licensed PT), Speech therapy     PT Frequency: 5x OT Frequency: 5x     Speech Therapy Frequency: Possibly 3x      Contractures Contractures Info: Not present    Additional Factors Info  Code Status, Allergies, Suctioning Needs, Insulin Sliding Scale Code Status Info: Full Code Allergies Info: Penicillins; Peanut-Containing Drug Products; Sustiva (Efavirenz)   Insulin Sliding Scale Info: Please see dc summary   Suctioning Needs: Currently as needed   Current Medications (10/07/2023):  This is the current hospital active medication list Current Facility-Administered Medications  Medication Dose Route Frequency Provider Last Rate Last Admin   acetaminophen (TYLENOL) tablet 650 mg  650 mg Per Tube Q6H PRN Mannam, Praveen, MD   650 mg at 10/07/23 0802   Or  acetaminophen (TYLENOL) suppository 650 mg  650 mg Rectal Q6H PRN Mannam, Praveen, MD       aspirin chewable tablet 81 mg  81 mg Per Tube Daily Desai, Rahul P, PA-C   81 mg at 10/07/23 0945   atorvastatin (LIPITOR) tablet 20 mg  20 mg Per Tube Daily Desai, Rahul P, PA-C   20 mg at 10/07/23 0946   bisacodyl (DULCOLAX) suppository 10 mg  10  mg Rectal Daily PRN Martina Sinner, MD       Chlorhexidine Gluconate Cloth 2 % PADS 6 each  6 each Topical Daily Desai, Rahul P, PA-C   6 each at 10/07/23 0981   clindamycin (CLEOCIN) IVPB 900 mg  900 mg Intravenous Q8H Daiva Eves, Lisette Grinder, MD   Stopped at 10/07/23 0455   clopidogrel (PLAVIX) tablet 75 mg  75 mg Per Tube Daily Desai, Rahul P, PA-C   75 mg at 10/07/23 0946   dexmedetomidine (PRECEDEX) 400 MCG/100ML (4 mcg/mL) infusion  0-1.8 mcg/kg/hr Intravenous Titrated Migdalia Dk, MD 48.3 mL/hr at 10/07/23 1100 1.8 mcg/kg/hr at 10/07/23 1100   diltiazem (CARDIZEM) tablet 60 mg  60 mg Per Tube Q6H Micki Riley, MD   60 mg at 10/07/23 1141   dolutegravir (TIVICAY) tablet 50 mg  50 mg Per Tube Daily Blanchard Kelch, NP   50 mg at 10/07/23 0951   emtricitabine-tenofovir AF (DESCOVY) 200-25 MG per tablet 1 tablet  1 tablet Per Tube Daily Blanchard Kelch, NP   1 tablet at 10/07/23 0951   enoxaparin (LOVENOX) injection 50 mg  50 mg Subcutaneous Q24H Calton Dach I, RPH   50 mg at 10/07/23 1000   famotidine (PEPCID) tablet 20 mg  20 mg Per Tube Daily Luciano Cutter, MD   20 mg at 10/07/23 0945   feeding supplement (OSMOLITE 1.5 CAL) liquid 1,000 mL  1,000 mL Per Tube Continuous Oretha Milch, MD 75 mL/hr at 10/07/23 1100 Infusion Verify at 10/07/23 1100   feeding supplement (PROSource TF20) liquid 60 mL  60 mL Per Tube Daily Oretha Milch, MD   60 mL at 10/06/23 1859   fentaNYL (SUBLIMAZE) injection 50-200 mcg  50-200 mcg Intravenous Q30 min PRN Selmer Dominion B, NP   50 mcg at 10/07/23 0211   fluconazole (DIFLUCAN) IVPB 400 mg  400 mg Intravenous Daily Willeen Niece, MD 100 mL/hr at 10/07/23 1100 Infusion Verify at 10/07/23 1100   free water 200 mL  200 mL Per Tube Q4H Morene Crocker, MD   200 mL at 10/07/23 1141   hydrALAZINE (APRESOLINE) injection 10 mg  10 mg Intravenous Q6H PRN Dezii, Alexandra, DO   10 mg at 10/03/23 1751   insulin aspart (novoLOG) injection  0-20 Units  0-20 Units Subcutaneous Q4H Martina Sinner, MD   7 Units at 10/07/23 0746   insulin aspart (novoLOG) injection 20 Units  20 Units Subcutaneous Q4H Luciano Cutter, MD   20 Units at 10/07/23 0746   insulin glargine-yfgn (SEMGLEE) injection 60 Units  60 Units Subcutaneous BID Luciano Cutter, MD   60 Units at 10/07/23 0951   labetalol (NORMODYNE) injection 10 mg  10 mg Intravenous Q2H PRN Dezii, Alexandra, DO   10 mg at 10/03/23 2326   levalbuterol (XOPENEX) nebulizer solution 0.63 mg  0.63 mg Nebulization Q6H PRN Therisa Doyne, MD   0.63 mg at 10/03/23 0943   [START ON 10/08/2023] methylPREDNISolone sodium succinate (SOLU-MEDROL) 40 mg/mL injection 40 mg  40 mg  Intravenous Daily Morene Crocker, MD       Followed by   Melene Muller ON 10/13/2023] methylPREDNISolone sodium succinate (SOLU-MEDROL) 40 mg/mL injection 20 mg  20 mg Intravenous Daily Morene Crocker, MD       metoprolol tartrate (LOPRESSOR) tablet 25 mg  25 mg Per Tube BID Luciano Cutter, MD   25 mg at 10/07/23 0946   midazolam (VERSED) injection 2 mg  2 mg Intravenous Q1H PRN Migdalia Dk, MD   2 mg at 10/07/23 0600   multivitamin with minerals tablet 1 tablet  1 tablet Per Tube Daily Oretha Milch, MD   1 tablet at 10/06/23 2240   nicotine (NICODERM CQ - dosed in mg/24 hours) patch 14 mg  14 mg Transdermal Daily Bowser, Kaylyn Layer, NP   14 mg at 10/07/23 1009   ondansetron (ZOFRAN) tablet 4 mg  4 mg Oral Q6H PRN Therisa Doyne, MD       Or   ondansetron (ZOFRAN) injection 4 mg  4 mg Intravenous Q6H PRN Therisa Doyne, MD       Oral care mouth rinse  15 mL Mouth Rinse Q2H Desai, Rahul P, PA-C   15 mL at 10/07/23 1141   Oral care mouth rinse  15 mL Mouth Rinse PRN Desai, Rahul P, PA-C       pneumococcal 20-valent conjugate vaccine (PREVNAR 20) injection 0.5 mL  0.5 mL Intramuscular Tomorrow-1000 [place], Elsie Ra, MD       polyethylene glycol (MIRALAX / GLYCOLAX) packet 17 g  17 g Per Tube  Daily Luciano Cutter, MD       primaquine tablet 30 mg  30 mg Per NG tube Daily Daiva Eves, Lisette Grinder, MD   30 mg at 10/07/23 1610   senna (SENOKOT) tablet 17.2 mg  2 tablet Per Tube QHS Martina Sinner, MD   17.2 mg at 10/06/23 2240     Discharge Medications: Please see discharge summary for a list of discharge medications.  Relevant Imaging Results:  Relevant Lab Results:   Additional Information SS# [id number]  Marliss Coots, LCSW

## 2023-10-07 NOTE — Progress Notes (Signed)
 Nutrition Follow-up  DOCUMENTATION CODES:  Obesity unspecified  INTERVENTION:  Continue tube feeding via cortrak tube: Osmolite 1.5 at 74mL/h x 21 hours (1575 ml per day) Hold tube feed 2 hours prior to Tivicay and 1 hour after  Prosource TF20 60 ml 1x/d Free water flushes q4 hours Provides 2442 kcal, 119 gm protein, 1200 ml free water daily (TF + flush = free water) MVI with  minerals daily  NUTRITION DIAGNOSIS:  Increased nutrient needs related to chronic illness as evidenced by estimated needs. - remains applicable  GOAL:  Patient will meet greater than or equal to 90% of their needs - progressing, TF meeting 100% of needs  MONITOR:  TF tolerance  REASON FOR ASSESSMENT:  Consult Enteral/tube feeding initiation and management  ASSESSMENT:  48 y.o. adult with medical history significant of diabetes type 2, seizure disorder, HIV untreated, obesity, polysubstance abuse .  Patient is admitted for pneumonia with hypoxia in the setting of HIV.  3/16 - Admitted to [facility] for [facility] 3/18 - MBS showed chronic aspiration of secretions, no diet formally recommended, MRI showed acute CVA 3/19 - PEA arrest and intubation 3/20 - transferred to [facility] for continuous EEG monitoring 3/21 - cortrak tube placed, pt self-extubated, emergently reintubated  Patient is currently intubated on ventilator support. Opens eyes slightly on exam. TF currently off for antiviral administration. RN to restart soon. Currently tolerating regimen, will leave as ordered.   Discussed in rounds, planning to go down for additional imaging this afternoon. Did not perform well on SBT earlier today.  Noted hypernatremia on labs, resident added free water flushes via OGT.  MV: 5.1 L/min Temp (24hrs), Avg:100.3 F (37.9 C), Min:98.6 F (37 C), Max:101.1 F (38.4 C) MAP (cuff):  Admit weight: 115.8 kg  Current weight: 106.3 kg 8.2% weight loss noted since admission, some loss could  be related to fluid shifts. Currently receiving TF at goal   Intake/Output Summary (Last 24 hours) at 10/07/2023 1326 Last data filed at 10/07/2023 1100 Gross per 24 hour  Intake 3191.87 ml  Output 1650 ml  Net 1541.87 ml  Net IO Since Admission: 5,517.9 mL [10/07/23 1326]  Drains/Lines: Cortrak UOP x 24 hours  Nutritionally Relevant Medications: Scheduled Meds:  atorvastatin  20 mg Per Tube Daily   dolutegravir  50 mg Per Tube Daily   emtricitabine-tenofovir AF  1 tablet Per Tube Daily   famotidine  20 mg Per Tube Daily   PROSource TF20  60 mL Per Tube Daily   insulin aspart  0-20 Units Subcutaneous Q4H   insulin aspart  20 Units Subcutaneous Q4H   insulin glargine-yfgn  60 Units Subcutaneous BID   methylPREDNISolone   80 mg Intravenous Daily   multivitamin with minerals  1 tablet Per Tube Daily   polyethylene glycol  17 g Per Tube Daily   primaquine  30 mg Per NG tube Daily   senna  2 tablet Per Tube QHS   Continuous Infusions:  sodium chloride 100 mL/hr at 10/07/23 0800   clindamycin (CLEOCIN) IV Stopped (10/07/23 0455)   feeding supplement (OSMOLITE 1.5 CAL) 75 mL/hr at 10/07/23 0800   fluconazole (DIFLUCAN) IV Stopped (10/06/23 1200)   PRN Meds: bisacodyl, ondansetron  Labs Reviewed: Na 148 K 5.5  BUN 57, creatinine 1.59 Magnesium 2.8 CBG ranges from 120-339 mg/dL over the last 24 hours HgbA1c 10.8% (3/19)   NUTRITION - FOCUSED PHYSICAL EXAM: Flowsheet Row Most Recent Value  Orbital Region No depletion  Upper Arm Region No depletion  Thoracic and Lumbar Region No depletion  Buccal Region No depletion  Temple Region Moderate depletion  Clavicle Bone Region No depletion  Clavicle and Acromion Bone Region No depletion  Scapular Bone Region No depletion  Dorsal Hand No depletion  Patellar Region No depletion  Anterior Thigh Region No depletion  Posterior Calf Region No depletion  Edema (RD Assessment) Mild  Hair Reviewed  Eyes Reviewed  Mouth  Reviewed  Skin Reviewed  Nails --  [broken and peeling]    Diet Order:   Diet Order             Diet NPO time specified  Diet effective now                   EDUCATION NEEDS:  Education needs have been addressed  Skin:  Skin Assessment: Reviewed RN Assessment  Last BM:  3/26 - type 6  Height:  Ht Readings from Last 1 Encounters:  09/29/23 6' (1.829 m)    Weight:  Wt Readings from Last 1 Encounters:  10/07/23 106.3 kg    Ideal Body Weight:  80.9 kg  BMI:  Body mass index is 31.78 kg/m.  Estimated Nutritional Needs:  Kcal:  2200-2500 kcal/d Protein:  110-120g Fluid:  2.4L/day    Greig Castilla, RD, LDN Registered Dietitian II Please reach out via secure chat

## 2023-10-07 NOTE — Progress Notes (Signed)
 Pt found biting tube. Patient bit down and broke off front tooth. Tooth removed from patients mouth and placed in biohazard.

## 2023-10-07 NOTE — Progress Notes (Signed)
 Physical Therapy Treatment Patient Details Name: Jeremiah Stevens MRN: [id number] DOB: Apr 28, 1976 Today's Date: 10/07/2023   History of Present Illness 48 yo pt admitted to [facility] 09/26/23 for difficulty swallowing. 3/19 PEA, ROSC after 2 min, then bradycardic arrest 1 min later, CPR resumed, pt intubated, and ROSC 6 min later. Seizure-like activity also noted briefly after ROSC. 3/20 pt transferred to [facility]. 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 3/21 self extubated and reintubated. PMHx: HIV/AIDS, HTN, herpes, migraine, depression, DM II, polysubstance use, and seizure.    PT Comments  Pt alert, and participating in therapy, stating being hot throughout despite 2 fans. She was able to assist with sitting balance in chair egress and sat grossly 8 min prior to fatigue. Limited activation into standing this session and discussed need to improve siting balance and trunk control to successfully achieve standing. Pt demonstrates 2/5 Rt hand grip and 2-/5 elbow flexion, 2/5 quad activation in sitting. With rolling pt communicating discomfort and when returned to supine and sitting pt unresponsive grossly 30 sec which coincided with SPO2 drop to 82% on 40% FiO2, PRVC, peep 8. Pt began to around after 30 sec and within a min returned to prior responsiveness, medical team present and aware. HR 69 and BP 113/66 (81). Will continue to follow and encouraged chair position during day.     If plan is discharge home, recommend the following: Assistance with cooking/housework;Direct supervision/assist for medications management;Direct supervision/assist for financial management;Assist for transportation;Help with stairs or ramp for entrance;Supervision due to cognitive status;Two people to help with bathing/dressing/bathroom;Two people to help with walking and/or transfers   Can travel by private Theme park manager cushion (measurements  PT);Wheelchair (measurements PT);Hoyer lift;Hospital bed    Recommendations for Other Services       Precautions / Restrictions Precautions Precautions: Fall;Other (comment) Recall of Precautions/Restrictions: Impaired Precaution/Restrictions Comments: ETT, cortrak, primofit, rectal pouch, oral temp probe     Mobility  Bed Mobility Overal bed mobility: Needs Assistance Bed Mobility: Supine to Sit, Sit to Supine, Rolling           General bed mobility comments: foot egress positioning for supine<>sit with pt able to pull trunk forward with rail with min assist to clear trunk. rt  lean in sitting needing continual support of LUE and mod cues for trunk and neck extension. total +2 to scoot to EOB and return to supine total +2. Max +2 to roll bil for pericare and linen change    Transfers Overall transfer level: Needs assistance   Transfers: Sit to/from Stand Sit to Stand: +2 physical assistance, +2 safety/equipment, Max assist, From elevated surface           General transfer comment: Bil knees blocked and pt cued to hold onto therapists' upper arms to stand from EOB. Pt initiated extending her knees on left not significantly on right with maintained anterior lean and unable to fully extend into hip and knee extension x 2 trials    Ambulation/Gait                   Stairs             Wheelchair Mobility     Tilt Bed    Modified Rankin (Stroke Patients Only) Modified Rankin (Stroke Patients Only) Pre-Morbid Rankin Score: No symptoms Modified Rankin: Severe disability     Balance Overall balance assessment: Needs assistance Sitting-balance support: Single extremity supported,  Feet supported Sitting balance-Leahy Scale: Poor Sitting balance - Comments: anterior right lean this session with mod cues and heavy reliance on LUE support to maintain sitting. Without UB support anterior flexion and unable to engage core without assist Postural control:  Right lateral lean Standing balance support: Bilateral upper extremity supported, During functional activity Standing balance-Leahy Scale: Zero                              Communication Communication Communication: Impaired Factors Affecting Communication: Trach/intubated  Cognition Arousal: Alert Behavior During Therapy: WFL for tasks assessed/performed   PT - Cognitive impairments: Difficult to assess Difficult to assess due to: Intubated                     PT - Cognition Comments: pt following cues for Left sided movement, delay with limited movement on right. mouthing name and "[place]", shaking head for answers. Attempted to use alphabet communication sheet and writing but not successful Following commands: Impaired Following commands impaired: Follows one step commands with increased time, Follows one step commands inconsistently    Cueing Cueing Techniques: Tactile cues, Verbal cues, Gestural cues  Exercises General Exercises - Upper Extremity Shoulder Flexion: Right, PROM, Seated, 10 reps General Exercises - Lower Extremity Short Arc Quad: AROM, Right, 10 reps, Seated    General Comments        Pertinent Vitals/Pain Pain Assessment Pain Assessment: No/denies pain Faces Pain Scale: No hurt    Home Living                          Prior Function            PT Goals (current goals can now be found in the care plan section) Progress towards PT goals: Progressing toward goals    Frequency    Min 2X/week      PT Plan      Co-evaluation              AM-PAC PT "6 Clicks" Mobility   Outcome Measure  Help needed turning from your back to your side while in a flat bed without using bedrails?: Total Help needed moving from lying on your back to sitting on the side of a flat bed without using bedrails?: Total Help needed moving to and from a bed to a chair (including a wheelchair)?: Total Help needed standing up from a  chair using your arms (e.g., wheelchair or bedside chair)?: Total Help needed to walk in hospital room?: Total Help needed climbing 3-5 steps with a railing? : Total 6 Click Score: 6    End of Session   Activity Tolerance: Patient tolerated treatment well Patient left: in bed;with call bell/phone within reach;with bed alarm set;with SCD's reapplied Nurse Communication: Mobility status;Need for lift equipment PT Visit Diagnosis: Unsteadiness on feet (R26.81);Hemiplegia and hemiparesis;Muscle weakness (generalized) (M62.81);Other abnormalities of gait and mobility (R26.89);Difficulty in walking, not elsewhere classified (R26.2);Other symptoms and signs involving the nervous system (R29.898) Hemiplegia - Right/Left: Right Hemiplegia - dominant/non-dominant: Non-dominant Hemiplegia - caused by: Cerebral infarction     Time: 0830-0904 PT Time Calculation (min) (ACUTE ONLY): 34 min  Charges:    $Therapeutic Activity: 23-37 mins PT General Charges $$ ACUTE PT VISIT: 1 Visit                     Katerin Negrete P, PT Acute Rehabilitation Services Office:  [phone]    Boyce Keltner B Sesilia Poucher 10/07/2023, 11:22 AM

## 2023-10-08 DIAGNOSIS — I6389 Other cerebral infarction: Secondary | ICD-10-CM | POA: Diagnosis not present

## 2023-10-08 DIAGNOSIS — J9621 Acute and chronic respiratory failure with hypoxia: Secondary | ICD-10-CM

## 2023-10-08 DIAGNOSIS — J9601 Acute respiratory failure with hypoxia: Secondary | ICD-10-CM | POA: Diagnosis not present

## 2023-10-08 DIAGNOSIS — J189 Pneumonia, unspecified organism: Secondary | ICD-10-CM | POA: Diagnosis not present

## 2023-10-08 LAB — PNEUMOCYSTIS PCR: Result Pneumocystis PCR: NEGATIVE

## 2023-10-08 LAB — CBC
HCT: 35.8 % — ABNORMAL LOW (ref 39.0–52.0)
Hemoglobin: 11 g/dL — ABNORMAL LOW (ref 13.0–17.0)
MCH: 29.3 pg (ref 26.0–34.0)
MCHC: 30.7 g/dL (ref 30.0–36.0)
MCV: 95.2 fL (ref 80.0–100.0)
Platelets: 201 10*3/uL (ref 150–400)
RBC: 3.76 MIL/uL — ABNORMAL LOW (ref 4.22–5.81)
RDW: 14.6 % (ref 11.5–15.5)
WBC: 10.5 10*3/uL (ref 4.0–10.5)
nRBC: 0 % (ref 0.0–0.2)

## 2023-10-08 LAB — GLUCOSE, CAPILLARY
Glucose-Capillary: 200 mg/dL — ABNORMAL HIGH (ref 70–99)
Glucose-Capillary: 212 mg/dL — ABNORMAL HIGH (ref 70–99)
Glucose-Capillary: 222 mg/dL — ABNORMAL HIGH (ref 70–99)
Glucose-Capillary: 234 mg/dL — ABNORMAL HIGH (ref 70–99)
Glucose-Capillary: 283 mg/dL — ABNORMAL HIGH (ref 70–99)

## 2023-10-08 LAB — BASIC METABOLIC PANEL WITH GFR
Anion gap: 7 (ref 5–15)
BUN: 47 mg/dL — ABNORMAL HIGH (ref 6–20)
CO2: 31 mmol/L (ref 22–32)
Calcium: 9.1 mg/dL (ref 8.9–10.3)
Chloride: 105 mmol/L (ref 98–111)
Creatinine, Ser: 1.3 mg/dL — ABNORMAL HIGH (ref 0.61–1.24)
GFR, Estimated: >60 mL/min (ref >60)
Glucose, Bld: 216 mg/dL — ABNORMAL HIGH (ref 70–99)
Potassium: 4.9 mmol/L (ref 3.5–5.1)
Sodium: 143 mmol/L (ref 135–145)

## 2023-10-08 LAB — MAGNESIUM: Magnesium: 2.4 mg/dL (ref 1.7–2.4)

## 2023-10-08 MED ORDER — FREE WATER
100.0000 mL | Status: DC
  Administered 2023-10-08 – 2023-11-10 (×185): 100 mL

## 2023-10-08 NOTE — Progress Notes (Signed)
 "STROKE TEAM PROGRESS NOTE    SIGNIFICANT HOSPITAL EVENTS  3/16: Admitted for defeat dysphagia, found to have multifocal pneumonia likely due to aspiration. 3/18: Developed right facial droop and right-sided weakness.  MRI shows acute right medullary infarct  3/19: Developed hypoxia and respiratory distress leading to cardiac arrest with 9-minute downtime followed by seizure episode.  Difficult airway was noted due to emesis.  Transferred to ICU.  Cerebellar EEG negative LTM EEG placed overnight  INTERIM HISTORY/SUBJECTIVE  No family at the bedside.  Patient remains intubated and still requiring ventilatory support for respiratory failure.  He is awake interactive and follows commands well and moves left side well but right side appears slightly weaker today though stronger than yesterday.Marland Kitchen  ENT has been consulted for tracheostomy which is planned for next week. OBJECTIVE  CBC    Component Value Date/Time   WBC 10.5 10/08/2023 0229   RBC 3.76 (L) 10/08/2023 0229   HGB 11.0 (L) 10/08/2023 0229   HGB 9.9 (L) 10/03/2023 1043   HCT 35.8 (L) 10/08/2023 0229   PLT 201 10/08/2023 0229   MCV 95.2 10/08/2023 0229   MCH 29.3 10/08/2023 0229   MCHC 30.7 10/08/2023 0229   RDW 14.6 10/08/2023 0229   LYMPHSABS 0.8 09/29/2023 0846   MONOABS 0.7 09/29/2023 0846   EOSABS 0.0 09/29/2023 0846   BASOSABS 0.0 09/29/2023 0846    BMET    Component Value Date/Time   NA 143 10/08/2023 0229   K 4.9 10/08/2023 0229   CL 105 10/08/2023 0229   CO2 31 10/08/2023 0229   GLUCOSE 216 (H) 10/08/2023 0229   BUN 47 (H) 10/08/2023 0229   CREATININE 1.30 (H) 10/08/2023 0229   CREATININE 1.03 05/22/2020 1128   CALCIUM 9.1 10/08/2023 0229   GFRNONAA >60 10/08/2023 0229   GFRNONAA 89 05/22/2020 1128    IMAGING past 24 hours No results found.   Vitals:   10/08/23 1112 10/08/23 1128 10/08/23 1144 10/08/23 1200  BP:  118/74 114/71 111/71  Pulse:    75  Resp:    12  Temp: 99 F (37.2 C)     TempSrc:  Axillary     SpO2:    100%  Weight:      Height:         PHYSICAL EXAM General: Critically ill in no apparent distress Psych:  Mood and affect appropriate for situation CV: Regular rate and rhythm on monitor Respiratory:  Regular, unlabored respirations mechanical ventilator GI: Abdomen soft and nontender   NEURO:  Intubated, sedated.  Eyes are closed open opens to voice, following commands Cranial Nerves:  II: Pupils equal and reactive III, IV, VI: Midline, tracks bilaterally, blinks bilaterally VII: UTA due to ETT VIII: hearing intact to voice. IX, X: Cough and gag present. ZO:[id number] shrug 5/5. XII: UTA Motor/sensory: LUE: 4/5, RUE 2/ 5 LLE: 4/5 RLE 2-3/5 Coordination: Deferred Gait- deferred  Most Recent NIH: 8   ASSESSMENT/PLAN  [name] is a 48 y.o. adult with history of HIV, DM, HTN, HLD, seizures in the setting of severe hyperglycemia, current smoker who presented to ED due to inability to swallow for 5 days, admitted for pnemonia. Neurology consulted 3/19 for dysphagia.  NIH on consult: 3.  Respiratory failure Cardiac arrest  Aspiration pneumonia  Developed hypoxia and respiratory distress leading to cardiac arrest with 9-minute downtime followed by seizure episode.  Difficult airway was noted due to emesis.  Intubated 3/19-> self-extubated 3/21-> re-intubated 3/21 Vent management per CCM Appreciate CCM  assistance fentanyl and propofol off Precedex infusion at 0.8 mcg On rocephin and flagyl  Stroke:  right lateral medullary infarct, etiology: small or large vessel disease from uncontrolled stroke risk factors    CT head x 2 no acute finding CTA head & neck no LVO, advanced for age arthrosclerosis L vertebral V4 segment and proximal basilar artery MRI  Acute Right Medullary Infarct. Cytotoxic edema with no hemorrhage or mass effect. 2D Echo: LVEF 60-65%, Grade I diastolic dysfunction LDL 95 HgbA1c 10.8 UDS positive for THC VTE  prophylaxis -Lovenox No antithrombotic prior to admission, now on aspirin 81 mg daily and clopidogrel 75 mg daily.  For 3 months then aspirin alone Therapy recommendations:  Pending Disposition:  pending  Seizure  Post cardiac arrest seizure activity On keppra Now on LTM EEG LTM 3/21 This study is suggestive of mild to moderate diffuse encephalopathy. No seizures or epileptiform discharges were seen throughout the recording.  LTM 3/22 This study is suggestive of mild to moderate diffuse encephalopathy. No seizures or epileptiform discharges were seen throughout the recording. Will DC LTM today  Hx of Hypertension Now hypotensive Home meds:  lopressor Stable Levophed off since 3/20 Avoid hypotension, goal MAP >65 Long term BP goal normotensive  Hyperlipidemia Home meds:  none LDL 95, goal < 70 Add Lipitor 20mg  Continue statin on discharge  Diabetes type II Uncontrolled Home meds: Lantus insulin (previously noted noncompliance) HgbA1c 10.8, goal < 7.0 CBGs SSI Recommend close follow-up with PCP for better DM control  Tobacco Abuse Patient smokes 0.5 packs per day for 26 years      Ready to quit? N/A Nicotine replacement therapy provided  Substance Abuse UDS positive for  THC       Ready to quit? 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 These were not seen on 08/28/23 MRI  Other Active Problems Hx of HIV, Home Meds: Biktarvy, bactrim and diflucan   Hospital day # 12  Patient continues to require ventilator support for respiratory failure and has been difficult to wean and may require tracheostomy.   Critical care team  has decided on elective tracheostomy to be done next week by ENT.  Agree with holding Plavix till after tracheostomy is done..  Discussed with Dr. Corliss Blacker critical care medicine.  Stroke team will sign off.  Kindly call for questions     This patient is critically ill and at significant risk of neurological worsening, death and care requires constant monitoring of vital signs, hemodynamics,respiratory and cardiac monitoring, extensive review of multiple databases, frequent neurological assessment, discussion with family, other specialists and medical decision making of high complexity.I have made any additions or clarifications directly to the above note.This critical care time does not reflect procedure time, or teaching time or supervisory time of PA/NP/Med Resident etc but could involve care discussion time.  I spent 30 minutes of neurocritical care time  in the care of  this patient.                Delia Heady, MD Medical Director [facility] Pager: [phone] 10/08/2023 2:05 PM

## 2023-10-08 NOTE — Progress Notes (Signed)
 Occupational Therapy Treatment Patient Details Name: Jeremiah Stevens MRN: [id number] DOB: [birth year] Today's Date: 10/08/2023   History of present illness 48 yo pt admitted to [facility] 09/26/23 for difficulty swallowing. 3/19 PEA, ROSC after 2 min, then bradycardic arrest 1 min later, CPR resumed, pt intubated, and ROSC 6 min later. Seizure-like activity also noted briefly after ROSC. 3/20 pt transferred to [facility]. 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 3/21 self extubated and reintubated. PMHx: HIV/AIDS, HTN, herpes, migraine, depression, DM II, polysubstance use, and seizure.   OT comments  Patient session focus on therapuetic exercises in order to increase activity tolerance. Patient alert and with family present and patient in near chair position upon entry. Patient completing ther-ex with breaks and VSS throughout. Retrograde massage and repositioning completed to R hand due to increased edema in session. Pull to sit not observed as patient began to fatigue at end of session. Very motivated to eat and get be extubated. OT recommendation remains appropriate at this time, OT will continue to follow.       If plan is discharge home, recommend the following:  Two people to help with walking and/or transfers;Two people to help with bathing/dressing/bathroom   Equipment Recommendations  Wheelchair (measurements OT);Wheelchair cushion (measurements OT);BSC/3in1;Hospital bed    Recommendations for Other Services      Precautions / Restrictions Precautions Precautions: Fall;Other (comment) Recall of Precautions/Restrictions: Impaired Precaution/Restrictions Comments: ETT, cortrak, primofit, rectal pouch, oral temp probe Restrictions Weight Bearing Restrictions Per Provider Order: No       Mobility Bed Mobility                    Transfers                         Balance                                           ADL  either performed or assessed with clinical judgement   ADL                                         General ADL Comments: Patient session focus on therapuetic exercises in order to increase activity tolerance. Patient alert and with family present and patient in near chair position upon entry. Patient completing ther-ex with breaks and VSS throughout. Retrograde massage and repositioning completed to R hand due to increased edema in session. Pull to sit not observed as patient began to fatigue at end of session. Very motivated to eat and get be extubated. OT recommendation remains appropriate at this time, OT will continue to follow.    Extremity/Trunk Assessment              Vision       Haematologist Communication Communication: Impaired Factors Affecting Communication: Trach/intubated   Cognition Arousal: Alert Behavior During Therapy: WFL for tasks assessed/performed Cognition: Difficult to assess Difficult to assess due to: Intubated           OT - Cognition Comments: Pt communicating via gestures, clipboard present for patient if needed                 Following  commands: Impaired Following commands impaired: Follows one step commands with increased time      Cueing   Cueing Techniques: Tactile cues, Verbal cues, Gestural cues  Exercises Exercises: General Upper Extremity, General Lower Extremity General Exercises - Upper Extremity Shoulder Flexion: Right, PROM, Seated, 10 reps, Left Shoulder Extension: Both, PROM, Seated, 10 reps Elbow Flexion: PROM, 10 reps, Seated, Both Elbow Extension: PROM, 10 reps, Both, Seated General Exercises - Lower Extremity Ankle Circles/Pumps: AROM, Both, 10 reps Quad Sets: AROM, Both, 10 reps Short Arc Quad: AROM, 10 reps, Seated, Both Hip ABduction/ADduction: AROM, Both, 10 reps Other Exercises Other Exercises: Completed quad activation with OT placing hand under kneecap x7  reps bilaterally Other Exercises: Educated on glute exercises as a precursor to standing    Shoulder Instructions       General Comments      Pertinent Vitals/ Pain       Pain Assessment Pain Assessment: PAINAD Facial Expression: Relaxed, neutral Body Movements: Absence of movements Muscle Tension: Relaxed Compliance with ventilator (intubated pts.): Tolerating ventilator or movement Vocalization (extubated pts.): N/A CPOT Total: 0  Home Living                                          Prior Functioning/Environment              Frequency  Min 2X/week        Progress Toward Goals  OT Goals(current goals can now be found in the care plan section)  Progress towards OT goals: Progressing toward goals  Acute Rehab OT Goals Patient Stated Goal: to get this tube out and eat OT Goal Formulation: With patient Time For Goal Achievement: 10/18/23 Potential to Achieve Goals: Good  Plan      Co-evaluation                 AM-PAC OT "6 Clicks" Daily Activity     Outcome Measure   Help from another person eating meals?: A Lot Help from another person taking care of personal grooming?: A Lot Help from another person toileting, which includes using toliet, bedpan, or urinal?: A Lot Help from another person bathing (including washing, rinsing, drying)?: A Lot Help from another person to put on and taking off regular upper body clothing?: A Lot Help from another person to put on and taking off regular lower body clothing?: Total 6 Click Score: 11    End of Session Equipment Utilized During Treatment: Oxygen  OT Visit Diagnosis: Unsteadiness on feet (R26.81);Muscle weakness (generalized) (M62.81) Symptoms and signs involving cognitive functions: Cerebral infarction   Activity Tolerance Patient tolerated treatment well   Patient Left in bed;with call bell/phone within reach;with bed alarm set;Other (comment);with family/visitor present   Nurse  Communication Need for lift equipment;Precautions;Mobility status        Time: 1353-1416 OT Time Calculation (min): 23 min  Charges: OT General Charges $OT Visit: 1 Visit OT Treatments $Therapeutic Exercise: 23-37 mins  Pollyann Glen E. Rozina Pointer, OTR/L Acute Rehabilitation Services [phone]   Cherlyn Cushing 10/08/2023, 2:35 PM

## 2023-10-08 NOTE — TOC Progression Note (Signed)
 Transition of Care [facility]) - Progression Note    Patient Details  Name: Ancil Dewan MRN: [id number] Date of Birth: 05-13-1976  [facility] CM/SW Contact  Marliss Coots, LCSW Phone Number: 10/08/2023, 2:50 PM  Clinical Narrative:     2:50 PM CSW was informed by [facility] IP Rehab admissions that they are out of network with patient's primary insurance. CSW was informed by [facility] IP Rehab admissions that they did not receive referral and provided fax number for CSW to refax referral. CSW refaxed referral to [facility] IP [facility]hab.  Expected Discharge Plan: IP Rehab Facility Barriers to Discharge: Continued Medical Work up  Expected Discharge Plan and Services In-house Referral: Clinical Social Work   Post Acute Care Choice: NA Living arrangements for the past 2 months: Single Family Home                 DME Arranged: N/A DME Agency: NA                   Social Determinants of Health (SDOH) Interventions SDOH Screenings   Food Insecurity: No Food Insecurity (09/27/2023)  Housing: Low Risk  (09/27/2023)  Transportation Needs: Unmet Transportation Needs (09/27/2023)  Utilities: Not At Risk (09/27/2023)  Depression (PHQ2-9): Low Risk  (05/22/2020)  Tobacco Use: High Risk (09/26/2023)    Readmission Risk Interventions     No data to display

## 2023-10-08 NOTE — Progress Notes (Signed)
 ID brief note  Afebrile for last 24 hrs    Latest Ref Rng & Units 10/08/2023    2:29 AM 10/07/2023    2:07 AM 10/04/2023    6:20 AM  CBC  WBC 4.0 - 10.5 K/uL 10.5  5.7  4.9   Hemoglobin 13.0 - 17.0 g/dL 65.7  84.6  96.2   Hematocrit 39.0 - 52.0 % 35.8  36.1  33.5   Platelets 150 - 400 K/uL 201  200  203       Latest Ref Rng & Units 10/08/2023    2:29 AM 10/07/2023   12:14 PM 10/07/2023    2:07 AM  CMP  Glucose 70 - 99 mg/dL 952   841   BUN 6 - 20 mg/dL 47   57   Creatinine 3.24 - 1.24 mg/dL 4.01   0.27   Sodium 253 - 145 mmol/L 143   148   Potassium 3.5 - 5.1 mmol/L 4.9  4.7  5.5   Chloride 98 - 111 mmol/L 105   110   CO2 22 - 32 mmol/L 31   34   Calcium 8.9 - 10.3 mg/dL 9.1   9.3    Results for orders placed or performed during the hospital encounter of 09/26/23  Blood culture (routine x 2)     Status: None   Collection Time: 09/26/23  9:01 PM   Specimen: BLOOD  Result Value Ref Range Status   Specimen Description   Final    BLOOD LEFT ANTECUBITAL Performed at [facility], [address] [place], [place] [address]    Special Requests   Final    BOTTLES DRAWN AEROBIC AND ANAEROBIC Blood Culture adequate volume Performed at [facility], [address] [place], [place] [address]    Culture   Final    NO GROWTH 5 DAYS Performed at [facility], [address] [place], [place] [address]    Report Status 10/01/2023 FINAL  Final  Blood culture (routine x 2)     Status: None   Collection Time: 09/26/23  9:01 PM   Specimen: BLOOD  Result Value Ref Range Status   Specimen Description   Final    BLOOD RIGHT ANTECUBITAL Performed at [facility], [address] [place], [place] [address]    Special Requests   Final    BOTTLES DRAWN AEROBIC AND ANAEROBIC Blood Culture adequate volume Performed at [facility], [address], [place], [place] [address]    Culture   Final    NO GROWTH 5  DAYS Performed at [facility], [address], [place], [place] [address]    Report Status 10/01/2023 FINAL  Final  Resp panel by RT-PCR (RSV, Flu A&B, Covid)     Status: None   Collection Time: 09/26/23  9:01 PM   Specimen: Nasal Swab  Result Value Ref Range Status   SARS Coronavirus 2 by RT PCR NEGATIVE NEGATIVE Final    Comment: (NOTE) SARS-CoV-2 target nucleic acids are NOT DETECTED.  The SARS-CoV-2 RNA is generally detectable in upper respiratory specimens during the acute phase of infection. The lowest concentration of SARS-CoV-2 viral copies this assay can detect is 138 copies/mL. A negative result does not preclude SARS-Cov-2 infection and should not be used as the sole basis for treatment or other patient management decisions. A negative result may occur with  improper specimen collection/handling, submission of specimen other than nasopharyngeal swab, presence of viral  mutation(s) within the areas targeted by this assay, and inadequate number of viral copies(<138 copies/mL). A negative result must be combined with clinical observations, patient history, and epidemiological information. The expected result is Negative.  Fact Sheet for Patients:  BloggerCourse.com  Fact Sheet for Healthcare Providers:  SeriousBroker.it  This test is no t yet approved or cleared by the [place] FDA and  has been authorized for detection and/or diagnosis of SARS-CoV-2 by FDA under an Emergency Use Authorization (EUA). This EUA will remain  in effect (meaning this test can be used) for the duration of the COVID-19 declaration under Section 564(b)(1) of the Act, 21 U.S.C.section 360bbb-3(b)(1), unless the authorization is terminated  or revoked sooner.       Influenza A by PCR NEGATIVE NEGATIVE Final   Influenza B by PCR NEGATIVE NEGATIVE Final    Comment: (NOTE) The Xpert Xpress SARS-CoV-2/FLU/RSV plus assay is intended as  an aid in the diagnosis of influenza from Nasopharyngeal swab specimens and should not be used as a sole basis for treatment. Nasal washings and aspirates are unacceptable for Xpert Xpress SARS-CoV-2/FLU/RSV testing.  Fact Sheet for Patients: BloggerCourse.com  Fact Sheet for Healthcare Providers: SeriousBroker.it  This test is not yet approved or cleared by the [place] FDA and has been authorized for detection and/or diagnosis of SARS-CoV-2 by FDA under an Emergency Use Authorization (EUA). This EUA will remain in effect (meaning this test can be used) for the duration of the COVID-19 declaration under Section 564(b)(1) of the Act, 21 U.S.C. section 360bbb-3(b)(1), unless the authorization is terminated or revoked.     Resp Syncytial Virus by PCR NEGATIVE NEGATIVE Final    Comment: (NOTE) Fact Sheet for Patients: BloggerCourse.com  Fact Sheet for Healthcare Providers: SeriousBroker.it  This test is not yet approved or cleared by the [place] FDA and has been authorized for detection and/or diagnosis of SARS-CoV-2 by FDA under an Emergency Use Authorization (EUA). This EUA will remain in effect (meaning this test can be used) for the duration of the COVID-19 declaration under Section 564(b)(1) of the Act, 21 U.S.C. section 360bbb-3(b)(1), unless the authorization is terminated or revoked.  Performed at [facility], [address] [place], [place] [address]   MRSA Next Gen by PCR, Nasal     Status: None   Collection Time: 09/27/23  8:10 AM   Specimen: Nasal Mucosa; Nasal Swab  Result Value Ref Range Status   MRSA by PCR Next Gen NOT DETECTED NOT DETECTED Final    Comment: (NOTE) The GeneXpert MRSA Assay (FDA approved for NASAL specimens only), is one component of a comprehensive MRSA colonization surveillance program. It is not intended to  diagnose MRSA infection nor to guide or monitor treatment for MRSA infections. Test performance is not FDA approved in patients less than 52 years old. Performed at [facility], [address] [place], [place] [address]   Expectorated Sputum Assessment w Gram Stain, Rflx to Resp Cult     Status: None   Collection Time: 09/27/23  9:04 AM   Specimen: Sputum  Result Value Ref Range Status   Specimen Description SPU  Final   Special Requests Immunocompromised  Final   Sputum evaluation   Final    THIS SPECIMEN IS ACCEPTABLE FOR SPUTUM CULTURE Performed at [facility], [address], [place], [place] [address]    Report Status 09/27/2023 FINAL  Final  Culture, Respiratory w Gram Stain     Status: None  Collection Time: 09/27/23  9:04 AM   Specimen: Sputum  Result Value Ref Range Status   Specimen Description   Final    SPU Performed at [facility], [address], [place], [place] [address]    Special Requests   Final    Immunocompromised Reflexed from [phone] Performed at [facility], [address], [place], [place] [address]    Gram Stain   Final    ABUNDANT WBC PRESENT,BOTH PMN AND MONONUCLEAR MODERATE SQUAMOUS EPITHELIAL CELLS PRESENT MODERATE GRAM POSITIVE COCCI IN PAIRS RARE GRAM NEGATIVE RODS FEW GRAM POSITIVE RODS    Culture   Final    FEW Normal respiratory flora-no Staph aureus or Pseudomonas seen Performed at [facility], [address] [place], [place] [address]    Report Status 09/29/2023 FINAL  Final  Culture, Respiratory w Gram Stain     Status: None   Collection Time: 09/29/23  5:08 PM   Specimen: Tracheal Aspirate; Respiratory  Result Value Ref Range Status   Specimen Description   Final    TRACHEAL ASPIRATE Performed at [facility], [address], [place], [place] [address]    Special Requests   Final    NONE Performed at [facility], [address] [place], [place] [address]    Gram Stain NO WBC SEEN NO ORGANISMS SEEN   Final   Culture   Final    RARE Normal respiratory flora-no Staph aureus or Pseudomonas seen Performed at [facility], [address], [place], [place] [address]    Report Status 10/02/2023 FINAL  Final  Culture, Respiratory w Gram Stain     Status: None   Collection Time: 09/30/23  1:17 AM   Specimen: Sputum; Respiratory  Result Value Ref Range Status   Specimen Description SPU  Final   Special Requests Immunocompromised  Final   Gram Stain   Final    MODERATE WBC PRESENT, PREDOMINANTLY PMN FEW GRAM POSITIVE COCCI    Culture   Final    Normal respiratory flora-no Staph aureus or Pseudomonas seen Performed at [facility], [address], [place], [place] [address]    Report Status 10/05/2023 FINAL  Final  MRSA Next Gen by PCR, Nasal     Status: None   Collection Time: 09/30/23  1:49 AM   Specimen: Nasal Mucosa; Nasal Swab  Result Value Ref Range Status   MRSA by PCR Next Gen NOT DETECTED NOT DETECTED Final    Comment: (NOTE) The GeneXpert MRSA Assay (FDA approved for NASAL specimens only), is one component of a comprehensive MRSA colonization surveillance program. It is not intended to diagnose MRSA infection nor to guide or monitor treatment for MRSA infections. Test performance is not FDA approved in patients less than 48 years old. Performed at [facility], [address], [place], [place] [address]   Culture, blood (Routine X 2) w Reflex to ID Panel     Status: None (Preliminary result)   Collection Time: 10/07/23 12:14 PM   Specimen: BLOOD LEFT ARM  Result Value Ref Range Status   Specimen Description BLOOD LEFT ARM  Final   Special Requests   Final    BOTTLES DRAWN AEROBIC AND ANAEROBIC Blood Culture results may not be optimal due to an inadequate volume of blood received in culture bottles   Culture   Final    NO GROWTH < 24 HOURS Performed at  [facility], [address] [place], [place] [address]    Report Status PENDING  Incomplete  Culture, blood (Routine X 2) w Reflex to ID Panel     Status: None (Preliminary result)   Collection Time: 10/07/23 12:14 PM   Specimen: BLOOD LEFT ARM  Result Value Ref Range Status   Specimen Description BLOOD LEFT ARM  Final   Special Requests   Final    BOTTLES DRAWN AEROBIC AND ANAEROBIC Blood Culture results may not be optimal due to an inadequate volume of blood received in culture bottles   Culture   Final    NO GROWTH < 24 HOURS Performed at [facility], [address], [place], [place] [address]    Report Status PENDING  Incomplete   Continue empiric tx for PJP pending Pneumocystis stain and PCR pending Continue descovy, tivcay and fluconazole  Monitor CBC and CMP Possible plans for trach next week  Dr Renold Don covering this week and will fu pending studies  Odette Fraction, MD Infectious Disease Physician [facility] [address] [place], [place] [address] Phone: [phone]  Fax: [phone]

## 2023-10-08 NOTE — Plan of Care (Signed)
  Problem: Nutritional: Goal: Maintenance of adequate nutrition will improve Outcome: Progressing   Problem: Skin Integrity: Goal: Risk for impaired skin integrity will decrease Outcome: Progressing   Problem: Education: Goal: Knowledge of General Education information will improve Description: Including pain rating scale, medication(s)/side effects and non-pharmacologic comfort measures Outcome: Progressing   Problem: Clinical Measurements: Goal: Will remain free from infection Outcome: Progressing Goal: Respiratory complications will improve Outcome: Progressing

## 2023-10-08 NOTE — Progress Notes (Addendum)
 NAME:  Jeremiah Stevens, MRN:  [id number], DOB:  10-Oct-1975, LOS: 12 ADMISSION DATE:  09/26/2023, CONSULTATION DATE:  09/29/23 REFERRING MD:  Rennis Chris CHIEF COMPLAINT:  Hypoxia   History of Present Illness:  Pt is encephelopathic; therefore, this HPI is obtained from chart review. Jeremiah Stevens is a 48 y.o. adult who has a PMH as below including but not limited to DM2, seizures, HIV AIDS untreated, cryptococcal disease, obesity, polysubstance abuse, prior CVA. She was admitted 09/26/23 with dysphagia x 3 days along with headaches and drooling. He was found to be hypoxic to 80s on room air.  CTA was negative for PE but showed multifocal PNA. He was started on abx and BD's. Biktarvy was continued and ID was consulted.   3/18 He had right facial droop, right sided weakness, dysarthria. Neurology was consulted and recommended CTA and MRI. MRI demonstrated acute right medullary infarct.  Despite ongoing dysphagia (MBS showed moderately severe pharyngeal phase dysphagia and imaging showed unilateral bulging on right), pt insisted on eating. He reportedly ordered pizza overnight 3/18 and certainly aspirated. He had threatened to leave AMA despite stroke workup ongoing.  3/19, he had hypoxia for which rapid response was called. While being assess, he continued to desaturate down into the 60s and then 40s. He required BVM and was transferred to the ICU for further evaluation and management. Upon arrival in the ICU, she had ongoing desaturations and bradycardia before developing PEA arrest.  She had 9 minutes ACLS prior to ROSC including epi x 4 and bicarb x 2. During intubation attempt, she had copious food material and emesis in her oropharynx. Visualization of vocal cords was difficult due to amount of food material and emesis despite suctioning. On 2nd attempt, ETT was passed successfully and saturations improved shortly thereafter. There was grand mal seizure activity noted after intubation that  abated after Midazolam administration.  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[facility]); Non-suicidal depressed mood; Gender dysphoria; RLL pneumonia; AKI (acute kidney injury) (HCC); Uncontrolled type 2 diabetes mellitus with hyperglycemia, without long-term current use of insulin (HCC); Tobacco abuse; HIV infection (HCC); Positive RPR test; Hyperosmolar hyperglycemic state (HHS) (HCC); Seizure (HCC); Acute hypoxic respiratory failure (HCC); Acute encephalopathy; Hyperammonemia (HCC); Pancytopenia (HCC); Cryptococcosis (HCC); CAP (community acquired pneumonia); Globus sensation; Aspiration pneumonia of both lungs due to vomit (HCC); Cardiac arrest, cause unspecified (HCC); Cerebrovascular accident (CVA) (HCC); Seizures (HCC); and 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  Significant Hospital Events: Including procedures, antibiotic start and stop dates in addition to other pertinent events   3/16 admit 3/18 neuro consult, found to have acute CVA 3/19 Ongoing aspiration despite obvious dysphagia, was planning to leave AMA but sister talked pt into staying. then had a massive aspiration event leading to worse hypoxia and PEA arrest. Txf to [facility] 3/20 PSV cumulative total of 5-6 hrs, following some commands/ purposefully 3/21 self extubated, was reintubated 3/23 Failed SBT due to WOB 3/24 Failed SBT due to apnea 3/25 Tolerated PS >4 hours 3/26: Intubated.  Interim History / Subjective:  Precedex was stopped yesterday.  Restraints ordered for overnight agitation.  This morning, patient  agitated, restarted PRN Precedex restarted, at a lower dose.  Objective:  Blood pressure (!) 128/94, pulse 88, temperature 98.6 F (37 C), temperature source Oral, resp. rate 12, height 6' (1.829 m), weight 114.2 kg, SpO2 100%.    Vent Mode: PRVC FiO2 (%):  [  30 %-40 %] 30  % Set Rate:  [12 bmp] 12 bmp Vt Set:  [470 mL] 470 mL PEEP:  [8 cmH20] 8 cmH20 Plateau Pressure:  [16 cmH20-17 cmH20] 17 cmH20   Intake/Output Summary (Last 24 hours) at 10/08/2023 0756 Last data filed at 10/08/2023 0700 Gross per 24 hour  Intake 2909.71 ml  Output 2200 ml  Net 709.71 ml   Filed Weights   10/06/23 1448 10/07/23 0456 10/08/23 0500  Weight: 108.7 kg 106.3 kg 114.2 kg   Physical Exam: General: Chronically ill-appearing, no acute distress HENT: [place], AT, ETT in place Eyes: EOMI, no scleral icterus Respiratory: Clear to auscultation bilaterally.  No crackles, wheezing or rales Cardiovascular: RRR, -M/R/G, no JVD GI: BS+, soft, nontender Extremities:-Edema,-tenderness Neuro: Awake and alert, nods head appropriately, CNII-XII grossly intact,  moves extremities x 4. BUE/BLE 4/5 GU: External foley in place  Imaging, labs and test in EMR in the last 24 hours reviewed independently by me. Pertinent findings below:  3/27 WBC 10.5 SCR 1.59 >>1.30. Blood glucose 216 PJP smear still pending  Labs/imaging personally reviewed:  CTA chest 3/16 > no PE, multifocal PNA. CT head 3/18 > neg. MRI brain 3/19 > acute right medullary infarct, cytotoxic edema with no hemorrhage or mass effect, small chronic right cerebellar infarcts. CTA head/neck 3/19 > no LVO, advanced for age atherosclerosis of L vertebral artery and prox basilar artery, possible mod L V4 stenosis.  Assessment & Plan:  Acute hypoxic respiratory failure Multifocal PNA, CAP  Possible pneumocystis pneumonia Aspiration event Dysphagia  Tobacco use  Difficulty weaning off ventilator due to medullary stroke.  ENT consulted for tracheostomy, with a tentative plan for surgery on  3/31 pending consent from patient's sister.  Patient on aspirin and Plavix, and I spoke to Dr. Pollyann Kennedy who recommends stopping Plavix today.   - Holding Plavix. - Continue on ceftriaxone and Flagyl for aspiration PNA. - Continue aspirin -  PRN Precedex for agitation - Primaquine and clindamycin for presumptive PJP pneumonia  Acute R medullary infarct, cytotoxic edema Hx small R cerebellar infarcts, R PICA territory infarct  Encephalopathy, multifactorial P - Stroke following, cont ASA, and statin - Holding Plavix as above. - secondary w/u per stroke service  - PT/ OT  PEA arrest Sz like activity  -reportedly purposeful movements 3/20  P -cEEG per neuro, stopped 3/22> no seizures noted -keppra loaded 3/19, now on BID keppra 500mg . Will discuss with Neuro regarding if able to DC -neuro following -supportive care post arrest, avoid fevers   HFpEF Hx HTN HLD P - Cw diltiazem. - Cw metop 25 BID - cont to monitor hemodynamics, pending dex response, consider add back pta meds - statin, as above   AKI   SCr improved 1.55 >>1.20.  Hypernatremia has resolved will decrease free water flushes to 100 mL/h.  - Monitor UOP/Cr - hemodynamic support - cont foley, strict I/Os, avoid nephrotoxins  Normocytic anemia - trend CBC  HIV / AIDS Hx +cryptococcal antigen  VL 3410 CD4 36 P  - ID following -Monitor for IRIS. - descovy per tube - fluconazole 400mg  daily - F/u PCP smear and PCR - Primaquine and clindamycin for PCP coverage  DM2, with hyperglycemia Elevated blood sugars, likely in setting of high-dose steroids.( 80 mg ) for PJP prophylaxis.  He is on 30 units of glargine and 10 units of lispro insulin.  Will hold off on insulin adjustment as steroid dosing will be reduced to 40 mg starting today.  -Solu-Medrol 40 mg  daily. -CBGs -30 Units glargine -10 units aspart TID.  Anemia, normocytic, mild - monitor  Hx provoked sz (hyperglycemia/ hypoxemia)  Seizure like activity following PEA arrest 3/19  P - AEDs as above  Hyperthyroidism r/o  -TSH low T3 normal, T4 normal   THC use  - cessation counseling when able  Code status - iPAL note 3/19 - DNR status - 3/21 patient self extubated and family  reversed to full code - 3/25. Patient and sister agreed to plan for extubation and re-intubation. Would want trach if indicated in the future  Best practice (evaluated daily):  Diet/type: Tube feeds. DVT prophylaxis: SCD Pressure ulcer(s): pressure ulcer assessment deferred  GI prophylaxis: PPI Lines: N/A Foley:  Yes, and it is still needed Code Status:  full code Last date of multidisciplinary goals of care discussion: 3/25. Patient and sister agreed to plan for extubation and re-intubation. Would want trach if indicated in the future  Laretta Bolster, M.D.  Internal Medicine Resident, PGY-1 [facility]  Pager: [phone] 7:56 AM, 10/08/2023

## 2023-10-09 DIAGNOSIS — I6389 Other cerebral infarction: Secondary | ICD-10-CM | POA: Diagnosis not present

## 2023-10-09 DIAGNOSIS — J9601 Acute respiratory failure with hypoxia: Secondary | ICD-10-CM | POA: Diagnosis not present

## 2023-10-09 DIAGNOSIS — J189 Pneumonia, unspecified organism: Secondary | ICD-10-CM | POA: Diagnosis not present

## 2023-10-09 DIAGNOSIS — J9621 Acute and chronic respiratory failure with hypoxia: Secondary | ICD-10-CM | POA: Diagnosis not present

## 2023-10-09 DIAGNOSIS — D649 Anemia, unspecified: Secondary | ICD-10-CM

## 2023-10-09 LAB — GLUCOSE, CAPILLARY
Glucose-Capillary: 168 mg/dL — ABNORMAL HIGH (ref 70–99)
Glucose-Capillary: 184 mg/dL — ABNORMAL HIGH (ref 70–99)
Glucose-Capillary: 187 mg/dL — ABNORMAL HIGH (ref 70–99)
Glucose-Capillary: 204 mg/dL — ABNORMAL HIGH (ref 70–99)
Glucose-Capillary: 233 mg/dL — ABNORMAL HIGH (ref 70–99)
Glucose-Capillary: 248 mg/dL — ABNORMAL HIGH (ref 70–99)

## 2023-10-09 LAB — CBC
HCT: 31.6 % — ABNORMAL LOW (ref 39.0–52.0)
Hemoglobin: 10.2 g/dL — ABNORMAL LOW (ref 13.0–17.0)
MCH: 29.9 pg (ref 26.0–34.0)
MCHC: 32.3 g/dL (ref 30.0–36.0)
MCV: 92.7 fL (ref 80.0–100.0)
Platelets: 180 10*3/uL (ref 150–400)
RBC: 3.41 MIL/uL — ABNORMAL LOW (ref 4.22–5.81)
RDW: 14.4 % (ref 11.5–15.5)
WBC: 4.9 10*3/uL (ref 4.0–10.5)
nRBC: 0 % (ref 0.0–0.2)

## 2023-10-09 LAB — MAGNESIUM: Magnesium: 2.3 mg/dL (ref 1.7–2.4)

## 2023-10-09 LAB — BASIC METABOLIC PANEL WITH GFR
Anion gap: 10 (ref 5–15)
BUN: 48 mg/dL — ABNORMAL HIGH (ref 6–20)
CO2: 31 mmol/L (ref 22–32)
Calcium: 9.5 mg/dL (ref 8.9–10.3)
Chloride: 101 mmol/L (ref 98–111)
Creatinine, Ser: 1.07 mg/dL (ref 0.61–1.24)
GFR, Estimated: >60 mL/min (ref >60)
Glucose, Bld: 259 mg/dL — ABNORMAL HIGH (ref 70–99)
Potassium: 4.5 mmol/L (ref 3.5–5.1)
Sodium: 142 mmol/L (ref 135–145)

## 2023-10-09 MED ORDER — PREDNISONE 10 MG PO TABS: 10.0000 mg | ORAL_TABLET | Freq: Every day | ORAL | Status: DC

## 2023-10-09 MED ORDER — PREDNISONE 20 MG PO TABS: 20.0000 mg | ORAL_TABLET | Freq: Every day | ORAL | Status: DC

## 2023-10-09 MED ORDER — PREDNISONE 20 MG PO TABS
20.0000 mg | ORAL_TABLET | Freq: Every day | ORAL | Status: AC
  Administered 2023-10-10: 20 mg
  Filled 2023-10-09: qty 1

## 2023-10-09 MED ORDER — FLUCONAZOLE 100 MG PO TABS
400.0000 mg | ORAL_TABLET | Freq: Every day | ORAL | Status: DC
  Administered 2023-10-09 – 2023-11-08 (×30): 400 mg
  Filled 2023-10-09 (×2): qty 4
  Filled 2023-10-09: qty 2
  Filled 2023-10-09: qty 4
  Filled 2023-10-09: qty 2
  Filled 2023-10-09: qty 4
  Filled 2023-10-09 (×3): qty 2
  Filled 2023-10-09: qty 4
  Filled 2023-10-09: qty 2
  Filled 2023-10-09 (×4): qty 4
  Filled 2023-10-09 (×2): qty 2
  Filled 2023-10-09 (×7): qty 4
  Filled 2023-10-09: qty 2
  Filled 2023-10-09 (×2): qty 4
  Filled 2023-10-09: qty 2
  Filled 2023-10-09 (×3): qty 4

## 2023-10-09 MED ORDER — OSMOLITE 1.5 CAL PO LIQD
1000.0000 mL | ORAL | Status: AC
  Administered 2023-10-09 – 2023-10-12 (×4): 1000 mL
  Filled 2023-10-09 (×6): qty 1000

## 2023-10-09 MED ORDER — ATOVAQUONE 750 MG/5ML PO SUSP
1500.0000 mg | Freq: Every day | ORAL | Status: DC
  Administered 2023-10-09 – 2023-11-07 (×30): 1500 mg
  Filled 2023-10-09 (×33): qty 10

## 2023-10-09 NOTE — Progress Notes (Signed)
 Id brief note   Pjp pcr negative  Has been getting clinda/prim for tx (elevated k/cr avoided bactrim) Also has been on pjp dose steroid  Intubated 30% fio2    -can d/c clinda/primaquine; aspiration pna already received enough days -can rapidly taper steroid over the next 3-5 days -- 20 mg daily to 10 mg daily and stopped or per pulmonology/ccm taper desire -continue hiv tx otherwise

## 2023-10-09 NOTE — Progress Notes (Signed)
 Brief Nutrition Follow-Up Note  RD working remotely.  Received secure chat from RN regarding pt feeling hungry on current tube feeding regimen. Pt last seen by RD on 10/07/23 for follow-up. Pt currently awake on vent support with trach planned for Monday, 10/11/23. Pt has a Cortrak tube in place with Osmolite 1.5 infusing at 75 mL/hr (tube feeds being held 2 hours prior to Tivicay administration and 1 hour after). Per RN, pt reporting ongoing feelings of hunger as well as uncomfortable hunger pains.  Discussed with RN that continuous tube feeds, even at a higher rate of 75 mL/hr, are still infusing at a slow enough pace that the body's "fullness response" is unlikely to be triggered, especially if pt is not experiencing delayed gastric emptying. Will increase rate from 75 mL/hr to 85 mL/hr in an effort to ease some of the pt's anxiety/stress related to feelings of hunger. Discussed challenge of administering bolus feeds via small-bore feeding tube. Recommend expediting process of PEG tube placement if pt unable to have diet advanced in a timely manner s/p trach. Once PEG tube in place, can transition from continuous to bolus tube feeding regimen that will more closely mimic normal eating patterns and help with uncomfortable feelings of hunger.  RD will adjust tube feeding regimen as follows: - Osmolite 1.5 @ 85 mL/hr x 21 hours (1785 mL daily) - Hold tube feeds 2 hours prior to Tivicay administration and 1 hour after - PROSource TF20 60 mL daily - Free water flushes of 100 mL every 4 hours  Adjusted tube feeding regimen provides 2758 kcal, 132 grams of protein, and 1360 ml of H2O.   Total free water with flushes: 1960 mL   Mertie Clause, MS, RD, LDN Registered Dietitian II Please see AMiON for contact information.

## 2023-10-09 NOTE — Progress Notes (Signed)
 NAME:  Jeremiah Stevens, MRN:  [id number], DOB:  07-22-1975, LOS: 13 ADMISSION DATE:  09/26/2023, CONSULTATION DATE:  09/29/23 REFERRING MD:  Rennis Chris CHIEF COMPLAINT:  Hypoxia   History of Present Illness:  Pt is encephelopathic; therefore, this HPI is obtained from chart review. Jeremiah Stevens is a 48 y.o. adult who has a PMH as below including but not limited to DM2, seizures, HIV AIDS untreated, cryptococcal disease, obesity, polysubstance abuse, prior CVA. She was admitted 09/26/23 with dysphagia x 3 days along with headaches and drooling. He was found to be hypoxic to 80s on room air.  CTA was negative for PE but showed multifocal PNA. He was started on abx and BD's. Biktarvy was continued and ID was consulted.   3/18 He had right facial droop, right sided weakness, dysarthria. Neurology was consulted and recommended CTA and MRI. MRI demonstrated acute right medullary infarct.  Despite ongoing dysphagia (MBS showed moderately severe pharyngeal phase dysphagia and imaging showed unilateral bulging on right), pt insisted on eating. He reportedly ordered pizza overnight 3/18 and certainly aspirated. He had threatened to leave AMA despite stroke workup ongoing.  3/19, he had hypoxia for which rapid response was called. While being assess, he continued to desaturate down into the 60s and then 40s. He required BVM and was transferred to the ICU for further evaluation and management. Upon arrival in the ICU, she had ongoing desaturations and bradycardia before developing PEA arrest.  He had 9 minutes ACLS prior to ROSC including epi x 4 and bicarb x 2. During intubation attempt, she had copious food material and emesis in her oropharynx. Visualization of vocal cords was difficult due to amount of food material and emesis despite suctioning. On 2nd attempt, ETT was passed successfully and saturations improved shortly thereafter. There was grand mal seizure activity noted after intubation that  abated after Midazolam administration.  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[facility]); Non-suicidal depressed mood; Gender dysphoria; RLL pneumonia; AKI (acute kidney injury) (HCC); Uncontrolled type 2 diabetes mellitus with hyperglycemia, without long-term current use of insulin (HCC); Tobacco abuse; HIV infection (HCC); Positive RPR test; Hyperosmolar hyperglycemic state (HHS) (HCC); Seizure (HCC); Acute hypoxic respiratory failure (HCC); Acute encephalopathy; Hyperammonemia (HCC); Pancytopenia (HCC); Cryptococcosis (HCC); CAP (community acquired pneumonia); Globus sensation; Aspiration pneumonia of both lungs due to vomit (HCC); Cardiac arrest, cause unspecified (HCC); Cerebrovascular accident (CVA) (HCC); Seizures (HCC); and 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  Significant Hospital Events: Including procedures, antibiotic start and stop dates in addition to other pertinent events   3/16 admit 3/18 neuro consult, found to have acute CVA 3/19 Ongoing aspiration despite obvious dysphagia, was planning to leave AMA but sister talked pt into staying. then had a massive aspiration event leading to worse hypoxia and PEA arrest. Txf to [facility] 3/20 PSV cumulative total of 5-6 hrs, following some commands/ purposefully 3/21 self extubated, was reintubated 3/23 Failed SBT due to WOB 3/24 Failed SBT due to apnea 3/25 Tolerated PS >4 hours 3/26: Intubated.  Interim History / Subjective:  Precedex 0.8 0.30+ PEEP 8, 470 x 12 I/O+ 6.5 L total  Objective:  Blood pressure 104/76, pulse 60, temperature 97.7 F (36.5 C), temperature source Axillary, resp. rate (!) 24, height 6' (1.829 m), weight 104.9 kg, SpO2 100%.    Vent Mode: PRVC FiO2 (%):  [30 %] 30 % Set Rate:  [12 bmp] 12 bmp Vt  Set:  [470 mL] 470 mL PEEP:  [8 cmH20] 8 cmH20 Plateau Pressure:  [15  cmH20-17 cmH20] 17 cmH20   Intake/Output Summary (Last 24 hours) at 10/09/2023 0724 Last data filed at 10/09/2023 0700 Gross per 24 hour  Intake 2685.92 ml  Output 1355 ml  Net 1330.92 ml   Filed Weights   10/07/23 0456 10/08/23 0500 10/09/23 0434  Weight: 106.3 kg 114.2 kg 104.9 kg   Physical Exam: General: Ill-appearing obese man, comfortable in bed, ventilated HENT: ET tube in place, no icterus, no secretion Respiratory: Clear bilaterally, decreased at both bases, no wheeze Cardiovascular: Regular, no murmur GI: Obese, somewhat protuberant, nontender with positive bowel sounds Extremities: No edema Neuro: Awake and alert, nods to questions, follows commands, writing notes.  Some residual left-sided weakness GU: External foley in place    Labs/imaging personally reviewed:  CTA chest 3/16 > no PE, multifocal PNA. CT head 3/18 > neg. MRI brain 3/19 > acute right medullary infarct, cytotoxic edema with no hemorrhage or mass effect, small chronic right cerebellar infarcts. CTA head/neck 3/19 > no LVO, advanced for age atherosclerosis of L vertebral artery and prox basilar artery, possible mod L V4 stenosis.  Assessment & Plan:  Acute hypoxic respiratory failure Multifocal PNA, CAP  Possible pneumocystis pneumonia Aspiration event Dysphagia  Tobacco use  -ENT assistance appreciated, planning for tracheostomy 3/31 for difficult ventilator weaning and also known dysphagia and poor airway protection -Plavix held, remains on aspirin -Ceftriaxone, metronidazole for aspiration pneumonia -Primaquine, clindamycin, steroid taper for presumptive PJP pneumonia, await smears  Acute R medullary infarct, cytotoxic edema Hx small R cerebellar infarcts, R PICA territory infarct  Encephalopathy, multifactorial P -Continue aspirin, statin -Reinitiate Plavix after tracheostomy placed -PT/OT  PEA arrest, presumed primary pulmonary arrest Sz like activity  -reportedly purposeful movements  3/20  P -cEEG per neuro, stopped 3/22> no seizures noted -Continue Keppra 500 twice daily for now.  May be able to consider discontinuation as he stabilizes and is seizure activity was in the setting of arrest -Neuroprotective measures  HFpEF Hx HTN HLD P -Continue diltiazem, metoprolol -Continue statin  AKI.  Resolved -Follow BMP, urine output  Normocytic anemia -Follow intermittent CBC  HIV / AIDS Hx +cryptococcal antigen  VL 3410 CD4 36 P -Appreciate ID input -Following for evidence IRIS as he is not compliant with his HAART outpatient -Descovy, Tivicay as ordered -Continue chronic fluconazole -Await PJP smear and PCR as above  DM2, with hyperglycemia, on steroids -Steroids are tapering -Semglee 30 units -Sliding scale insulin -10 units tube feed coverage  Hyperthyroidism r/o  -TSH low T3 normal, T4 normal   THC use  -Cessation counseling when able  Code status - iPAL note 3/19 - DNR status - 3/21 patient self extubated and family reversed to full code - 3/25. Patient and sister agreed to plan for extubation and re-intubation. Would want trach if indicated in the future  Best practice (evaluated daily):  Diet/type: Tube feeds. DVT prophylaxis: SCD Pressure ulcer(s): pressure ulcer assessment deferred  GI prophylaxis: PPI Lines: N/A Foley:  Yes, and it is still needed Code Status:  full code Last date of multidisciplinary goals of care discussion: 3/25. Patient and sister agreed to plan for extubation and re-intubation. Would want trach  Independent critical care time 32 minutes  Levy Pupa, MD, PhD 10/09/2023, 7:24 AM [facility] [phone] or if no answer before 7:00PM call [phone] For any issues after 7:00PM please call eLink [phone]

## 2023-10-09 NOTE — Plan of Care (Signed)
  Problem: Education: Goal: Knowledge of General Education information will improve Description Including pain rating scale, medication(s)/side effects and non-pharmacologic comfort measures Outcome: Progressing   Problem: Clinical Measurements: Goal: Will remain free from infection Outcome: Progressing Goal: Respiratory complications will improve Outcome: Progressing   Problem: Nutrition: Goal: Adequate nutrition will be maintained Outcome: Progressing   Problem: Coping: Goal: Level of anxiety will decrease Outcome: Progressing   Problem: Safety: Goal: Ability to remain free from injury will improve Outcome: Progressing   

## 2023-10-10 DIAGNOSIS — I6389 Other cerebral infarction: Secondary | ICD-10-CM | POA: Diagnosis not present

## 2023-10-10 DIAGNOSIS — J9601 Acute respiratory failure with hypoxia: Secondary | ICD-10-CM | POA: Diagnosis not present

## 2023-10-10 DIAGNOSIS — J9621 Acute and chronic respiratory failure with hypoxia: Secondary | ICD-10-CM | POA: Diagnosis not present

## 2023-10-10 DIAGNOSIS — J189 Pneumonia, unspecified organism: Secondary | ICD-10-CM | POA: Diagnosis not present

## 2023-10-10 LAB — BASIC METABOLIC PANEL WITH GFR
Anion gap: 13 (ref 5–15)
BUN: 41 mg/dL — ABNORMAL HIGH (ref 6–20)
CO2: 26 mmol/L (ref 22–32)
Calcium: 9.2 mg/dL (ref 8.9–10.3)
Chloride: 101 mmol/L (ref 98–111)
Creatinine, Ser: 1.03 mg/dL (ref 0.61–1.24)
GFR, Estimated: >60 mL/min (ref >60)
Glucose, Bld: 206 mg/dL — ABNORMAL HIGH (ref 70–99)
Potassium: 4.4 mmol/L (ref 3.5–5.1)
Sodium: 140 mmol/L (ref 135–145)

## 2023-10-10 LAB — GLUCOSE, CAPILLARY
Glucose-Capillary: 196 mg/dL — ABNORMAL HIGH (ref 70–99)
Glucose-Capillary: 200 mg/dL — ABNORMAL HIGH (ref 70–99)
Glucose-Capillary: 213 mg/dL — ABNORMAL HIGH (ref 70–99)
Glucose-Capillary: 229 mg/dL — ABNORMAL HIGH (ref 70–99)
Glucose-Capillary: 249 mg/dL — ABNORMAL HIGH (ref 70–99)
Glucose-Capillary: 255 mg/dL — ABNORMAL HIGH (ref 70–99)
Glucose-Capillary: 288 mg/dL — ABNORMAL HIGH (ref 70–99)

## 2023-10-10 LAB — MAGNESIUM: Magnesium: 2 mg/dL (ref 1.7–2.4)

## 2023-10-10 MED ORDER — INSULIN ASPART 100 UNIT/ML IJ SOLN
13.0000 [IU] | INTRAMUSCULAR | Status: DC
  Administered 2023-10-10 – 2023-10-11 (×5): 13 [IU] via SUBCUTANEOUS

## 2023-10-10 MED ORDER — TEMAZEPAM 15 MG PO CAPS
15.0000 mg | ORAL_CAPSULE | Freq: Once | ORAL | Status: AC
  Administered 2023-10-10: 15 mg via NASOGASTRIC
  Filled 2023-10-10: qty 1

## 2023-10-10 MED ORDER — INSULIN GLARGINE-YFGN 100 UNIT/ML ~~LOC~~ SOLN
30.0000 [IU] | Freq: Two times a day (BID) | SUBCUTANEOUS | Status: DC
  Administered 2023-10-11: 30 [IU] via SUBCUTANEOUS
  Filled 2023-10-10 (×3): qty 0.3

## 2023-10-10 MED ORDER — MELATONIN 5 MG PO TABS
10.0000 mg | ORAL_TABLET | Freq: Once | ORAL | Status: AC
  Administered 2023-10-10: 10 mg via ORAL
  Filled 2023-10-10: qty 2

## 2023-10-10 NOTE — Progress Notes (Addendum)
 eLink Physician-Brief Progress Note Patient Name: Jeremiah Stevens DOB: 1976/03/19 MRN: [id number]   Date of Service  10/10/2023  HPI/Events of Note  RN is requesting something to help patient's sleep.  Already on dexmedetomidine.  eICU Interventions  Melatonin ordered.     Intervention Category Minor Interventions: Routine modifications to care plan (e.g. PRN medications for pain, fever)  Addendum: Patient still attempting to self extubate after melatonin was given.  2-way A/V assessment of patient done.  Was reportedly very agitated but is now calm and communicative.   Dexmedetomidine increased to 1.5 from 0.9.  A one-time oral dose of temazepam via NG tube ordered as well.  Communicated with bedside nurse who was present for the video interaction.  [place] 10/10/2023, 1:14 AM

## 2023-10-10 NOTE — Plan of Care (Signed)
  Problem: Health Behavior/Discharge Planning: Goal: Ability to identify and utilize available resources and services will improve Outcome: Progressing Goal: Ability to manage health-related needs will improve Outcome: Progressing   Problem: Metabolic: Goal: Ability to maintain appropriate glucose levels will improve Outcome: Progressing   Problem: Nutritional: Goal: Maintenance of adequate nutrition will improve Outcome: Progressing Goal: Progress toward achieving an optimal weight will improve Outcome: Progressing   Problem: Skin Integrity: Goal: Risk for impaired skin integrity will decrease Outcome: Progressing   Problem: Tissue Perfusion: Goal: Adequacy of tissue perfusion will improve Outcome: Progressing   Problem: Clinical Measurements: Goal: Ability to maintain clinical measurements within normal limits will improve Outcome: Progressing Goal: Will remain free from infection Outcome: Progressing Goal: Diagnostic test results will improve Outcome: Progressing Goal: Respiratory complications will improve Outcome: Progressing Goal: Cardiovascular complication will be avoided Outcome: Progressing   Problem: Activity: Goal: Risk for activity intolerance will decrease Outcome: Progressing   Problem: Skin Integrity: Goal: Risk for impaired skin integrity will decrease Outcome: Progressing   Problem: Respiratory: Goal: Ability to maintain adequate ventilation will improve Outcome: Progressing Goal: Ability to maintain a clear airway will improve Outcome: Progressing

## 2023-10-10 NOTE — Progress Notes (Signed)
 NAME:  Jeremiah Stevens, MRN:  [id number], DOB:  1975/07/31, LOS: 14 ADMISSION DATE:  09/26/2023, CONSULTATION DATE:  09/29/23 REFERRING MD:  Rennis Jeremiah CHIEF COMPLAINT:  Hypoxia   History of Present Illness:  Pt is encephelopathic; therefore, this HPI is obtained from chart review. Jeremiah Stevens is a 48 y.o. adult who has a PMH as below including but not limited to DM2, seizures, HIV AIDS untreated, cryptococcal disease, obesity, polysubstance abuse, prior CVA. She was admitted 09/26/23 with dysphagia x 3 days along with headaches and drooling. He was found to be hypoxic to 80s on room air.  CTA was negative for PE but showed multifocal PNA. He was started on abx and BD's. Biktarvy was continued and ID was consulted.   3/18 He had right facial droop, right sided weakness, dysarthria. Neurology was consulted and recommended CTA and MRI. MRI demonstrated acute right medullary infarct.  Despite ongoing dysphagia (MBS showed moderately severe pharyngeal phase dysphagia and imaging showed unilateral bulging on right), pt insisted on eating. He reportedly ordered pizza overnight 3/18 and certainly aspirated. He had threatened to leave AMA despite stroke workup ongoing.  3/19, he had hypoxia for which rapid response was called. While being assess, he continued to desaturate down into the 60s and then 40s. He required BVM and was transferred to the ICU for further evaluation and management. Upon arrival in the ICU, she had ongoing desaturations and bradycardia before developing PEA arrest.  He had 9 minutes ACLS prior to ROSC including epi x 4 and bicarb x 2. During intubation attempt, she had copious food material and emesis in her oropharynx. Visualization of vocal cords was difficult due to amount of food material and emesis despite suctioning. On 2nd attempt, ETT was passed successfully and saturations improved shortly thereafter. There was grand mal seizure activity noted after intubation that  abated after Midazolam administration.  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[facility]); Non-suicidal depressed mood; Gender dysphoria; RLL pneumonia; AKI (acute kidney injury) (HCC); Uncontrolled type 2 diabetes mellitus with hyperglycemia, without long-term current use of insulin (HCC); Tobacco abuse; HIV infection (HCC); Positive RPR test; Hyperosmolar hyperglycemic state (HHS) (HCC); Seizure (HCC); Acute hypoxic respiratory failure (HCC); Acute encephalopathy; Hyperammonemia (HCC); Pancytopenia (HCC); Cryptococcosis (HCC); CAP (community acquired pneumonia); Globus sensation; Aspiration pneumonia of both lungs due to vomit (HCC); Cardiac arrest, cause unspecified (HCC); Cerebrovascular accident (CVA) (HCC); Seizures (HCC); and 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  Significant Hospital Events: Including procedures, antibiotic start and stop dates in addition to other pertinent events   3/16 admit 3/18 neuro consult, found to have acute CVA 3/19 Ongoing aspiration despite obvious dysphagia, was planning to leave AMA but sister talked pt into staying. then had a massive aspiration event leading to worse hypoxia and PEA arrest. Txf to [facility] 3/20 PSV cumulative total of 5-6 hrs, following some commands/ purposefully 3/21 self extubated, was reintubated 3/23 Failed SBT due to WOB 3/24 Failed SBT due to apnea 3/25 Tolerated PS >4 hours 3/26: Intubated.  Interim History / Subjective:  Some agitation reported overnight, Precedex increased and single dose temazepam given I/O+ 7 L total   Objective:  Blood pressure 121/81, pulse 83, temperature 97.9 F (36.6 C), resp. rate 19, height 6' (1.829 m), weight 104.9 kg, SpO2 94%.    Vent Mode: PRVC FiO2 (%):  [30 %] 30 % Set Rate:  [12 bmp] 12 bmp Vt  Set:  [470 mL-12470 mL] 470 mL PEEP:  [8 cmH20] 8  cmH20 Plateau Pressure:  [18 cmH20-20 cmH20] 20 cmH20   Intake/Output Summary (Last 24 hours) at 10/10/2023 0726 Last data filed at 10/10/2023 0630 Gross per 24 hour  Intake 2391.57 ml  Output 1850 ml  Net 541.57 ml   Filed Weights   10/07/23 0456 10/08/23 0500 10/09/23 0434  Weight: 106.3 kg 114.2 kg 104.9 kg   Physical Exam: General: Ill-appearing obese man, ventilated, comfortable on Precedex HENT: ET tube in place, minimal secretions Respiratory: Coarse bilaterally, decreased at both bases.  No wheezes, no crackles Cardiovascular: Regular, no murmur GI: Obese, protuberant, positive bowel sounds Extremities: No edema Neuro: Sleepy on Precedex 0.9, does wake up to voice, nods to questions, interacts, follows commands GU: External foley in place    Labs/imaging personally reviewed:  CTA chest 3/16 > no PE, multifocal PNA. CT head 3/18 > neg. MRI brain 3/19 > acute right medullary infarct, cytotoxic edema with no hemorrhage or mass effect, small chronic right cerebellar infarcts. CTA head/neck 3/19 > no LVO, advanced for age atherosclerosis of L vertebral artery and prox basilar artery, possible mod L V4 stenosis.  Assessment & Plan:  Acute hypoxic respiratory failure Multifocal PNA, CAP  Possible pneumocystis pneumonia Aspiration event Dysphagia  Tobacco use  -ENT assistance appreciated, tracheostomy planned for 3/31 given his known dysphagia, poor airway protection, difficulty weaning from MV -Plavix on hold, stop aspirin 3/30.  Restart after tracheostomy -Ceftriaxone and metronidazole for aspiration pneumonia -PJP was negative, changed to prophylactic atovaquone and rapid taper steroids  Acute R medullary infarct, cytotoxic edema Hx small R cerebellar infarcts, R PICA territory infarct  Encephalopathy, multifactorial P -Continue statin, hold aspirin 3/30 Restart Plavix and aspirin after trach placed PT/OT  PEA arrest, presumed primary pulmonary arrest Sz like  activity  -reportedly purposeful movements 3/20  P -cEEG per neuro, stopped 3/22> no seizures noted -Keppra 500 mg twice daily for now, need to determine duration since seizures occurred in the setting of his arrest -Neuroprotective measures  HFpEF Hx HTN HLD P -Continue diltiazem, metoprolol -Continue statin  AKI.  Resolved -Following urine output, intermittent BMP  Normocytic anemia -Follow intermittent CBC  HIV / AIDS Hx +cryptococcal antigen  VL 3410 CD4 36 P -Appreciate ID input -Continue to follow for evidence IRIS as he is not compliant with his HAART outpatient -Continue Descovy, Tivicay as ordered -Continue chronic fluconazole -PJP prophylaxis initiated 3/29 (PCR negative)  DM2, with hyperglycemia, on steroids -Steroids are tapering -Continue Semglee 30 units -Continue sliding scale insulin -Continue 10 units tube feed coverage  Hyperthyroidism r/o  -TSH low T3 normal, T4 normal   THC use  -Cessation counseling when able  Code status - iPAL note 3/19 - DNR status - 3/21 patient self extubated and family reversed to full code - 3/25. Patient and sister agreed to plan for extubation and re-intubation. Would want trach if indicated in the future  Best practice (evaluated daily):  Diet/type: Tube feeds. DVT prophylaxis: SCD Pressure ulcer(s): pressure ulcer assessment deferred  GI prophylaxis: PPI Lines: N/A Foley:  Yes, and it is still needed Code Status:  full code Last date of multidisciplinary goals of care discussion: 3/25. Patient and sister agreed to plan for extubation and re-intubation. Would want trach  Independent critical care time 31 minutes  Levy Pupa, MD, PhD 10/10/2023, 7:26 AM [facility] [phone] or if no answer before 7:00PM call [phone] For any issues after 7:00PM please  call eLink [phone]

## 2023-10-11 ENCOUNTER — Encounter (HOSPITAL_COMMUNITY)
Admission: EM | Disposition: A | Discharge status: Skilled Nursing Facility | Payer: Self-pay | Source: Home / Self Care | Attending: Pulmonary Disease

## 2023-10-11 ENCOUNTER — Encounter (HOSPITAL_COMMUNITY): Payer: Self-pay | Admitting: Pulmonary Disease

## 2023-10-11 ENCOUNTER — Inpatient Hospital Stay (HOSPITAL_COMMUNITY): Payer: Self-pay | Admitting: Certified Registered"

## 2023-10-11 ENCOUNTER — Other Ambulatory Visit: Payer: Self-pay

## 2023-10-11 DIAGNOSIS — I639 Cerebral infarction, unspecified: Secondary | ICD-10-CM

## 2023-10-11 DIAGNOSIS — J690 Pneumonitis due to inhalation of food and vomit: Secondary | ICD-10-CM

## 2023-10-11 DIAGNOSIS — J9601 Acute respiratory failure with hypoxia: Secondary | ICD-10-CM | POA: Diagnosis not present

## 2023-10-11 DIAGNOSIS — F17210 Nicotine dependence, cigarettes, uncomplicated: Secondary | ICD-10-CM

## 2023-10-11 DIAGNOSIS — Z794 Long term (current) use of insulin: Secondary | ICD-10-CM

## 2023-10-11 DIAGNOSIS — B20 Human immunodeficiency virus [HIV] disease: Secondary | ICD-10-CM

## 2023-10-11 DIAGNOSIS — I10 Essential (primary) hypertension: Secondary | ICD-10-CM

## 2023-10-11 DIAGNOSIS — E119 Type 2 diabetes mellitus without complications: Secondary | ICD-10-CM

## 2023-10-11 DIAGNOSIS — B459 Cryptococcosis, unspecified: Secondary | ICD-10-CM | POA: Diagnosis not present

## 2023-10-11 DIAGNOSIS — J9690 Respiratory failure, unspecified, unspecified whether with hypoxia or hypercapnia: Secondary | ICD-10-CM | POA: Diagnosis not present

## 2023-10-11 DIAGNOSIS — R1310 Dysphagia, unspecified: Secondary | ICD-10-CM | POA: Diagnosis not present

## 2023-10-11 HISTORY — PX: TRACHEOSTOMY TUBE PLACEMENT: SHX814

## 2023-10-11 LAB — GLUCOSE, CAPILLARY
Glucose-Capillary: 183 mg/dL — ABNORMAL HIGH (ref 70–99)
Glucose-Capillary: 127 mg/dL — ABNORMAL HIGH (ref 70–99)
Glucose-Capillary: 47 mg/dL — ABNORMAL LOW (ref 70–99)
Glucose-Capillary: 162 mg/dL — ABNORMAL HIGH (ref 70–99)
Glucose-Capillary: 200 mg/dL — ABNORMAL HIGH (ref 70–99)
Glucose-Capillary: 275 mg/dL — ABNORMAL HIGH (ref 70–99)
Glucose-Capillary: 237 mg/dL — ABNORMAL HIGH (ref 70–99)

## 2023-10-11 LAB — BASIC METABOLIC PANEL WITH GFR
Anion gap: 9 (ref 5–15)
BUN: 33 mg/dL — ABNORMAL HIGH (ref 6–20)
CO2: 33 mmol/L — ABNORMAL HIGH (ref 22–32)
Calcium: 9.6 mg/dL (ref 8.9–10.3)
Chloride: 102 mmol/L (ref 98–111)
Creatinine, Ser: 1.14 mg/dL (ref 0.61–1.24)
GFR, Estimated: >60 mL/min (ref >60)
Glucose, Bld: 64 mg/dL — ABNORMAL LOW (ref 70–99)
Potassium: 4.9 mmol/L (ref 3.5–5.1)
Sodium: 144 mmol/L (ref 135–145)

## 2023-10-11 LAB — CBC
HCT: 34.5 % — ABNORMAL LOW (ref 39.0–52.0)
Hemoglobin: 11 g/dL — ABNORMAL LOW (ref 13.0–17.0)
MCH: 30 pg (ref 26.0–34.0)
MCHC: 31.9 g/dL (ref 30.0–36.0)
MCV: 94 fL (ref 80.0–100.0)
Platelets: 194 10*3/uL (ref 150–400)
RBC: 3.67 MIL/uL — ABNORMAL LOW (ref 4.22–5.81)
RDW: 14.6 % (ref 11.5–15.5)
WBC: 10.4 10*3/uL (ref 4.0–10.5)
nRBC: 0 % (ref 0.0–0.2)

## 2023-10-11 LAB — HIV-1 RNA QUANT-NO REFLEX-BLD
HIV 1 RNA Quant: 240 {copies}/mL
LOG10 HIV-1 RNA: 2.38 {Log_copies}/mL

## 2023-10-11 LAB — MAGNESIUM: Magnesium: 2.1 mg/dL (ref 1.7–2.4)

## 2023-10-11 LAB — PHOSPHORUS: Phosphorus: 3.7 mg/dL (ref 2.5–4.6)

## 2023-10-11 SURGERY — CREATION, TRACHEOSTOMY
Anesthesia: General | Site: Neck

## 2023-10-11 MED ORDER — SODIUM CHLORIDE 0.9 % IV SOLN: INTRAVENOUS | Status: DC | PRN

## 2023-10-11 MED ORDER — INSULIN ASPART 100 UNIT/ML IJ SOLN
10.0000 [IU] | INTRAMUSCULAR | Status: DC
  Administered 2023-10-11 – 2023-10-12 (×4): 10 [IU] via SUBCUTANEOUS

## 2023-10-11 MED ORDER — DILTIAZEM HCL 60 MG PO TABS
60.0000 mg | ORAL_TABLET | Freq: Four times a day (QID) | ORAL | Status: DC
  Administered 2023-10-12 – 2023-10-26 (×51): 60 mg
  Filled 2023-10-11 (×57): qty 1

## 2023-10-11 MED ORDER — FENTANYL CITRATE (PF) 250 MCG/5ML IJ SOLN
INTRAMUSCULAR | Status: AC
  Filled 2023-10-11: qty 5

## 2023-10-11 MED ORDER — 0.9 % SODIUM CHLORIDE (POUR BTL) OPTIME
TOPICAL | Status: DC | PRN
  Administered 2023-10-11: 1000 mL

## 2023-10-11 MED ORDER — PHENYLEPHRINE 80 MCG/ML (10ML) SYRINGE FOR IV PUSH (FOR BLOOD PRESSURE SUPPORT)
PREFILLED_SYRINGE | INTRAVENOUS | Status: DC | PRN
  Administered 2023-10-11: 120 ug via INTRAVENOUS

## 2023-10-11 MED ORDER — PROPOFOL 10 MG/ML IV BOLUS
INTRAVENOUS | Status: AC
  Filled 2023-10-11: qty 20

## 2023-10-11 MED ORDER — MIDAZOLAM HCL 2 MG/2ML IJ SOLN
INTRAMUSCULAR | Status: AC
  Filled 2023-10-11: qty 2

## 2023-10-11 MED ORDER — ROCURONIUM BROMIDE 10 MG/ML (PF) SYRINGE
PREFILLED_SYRINGE | INTRAVENOUS | Status: DC | PRN
Start: 2023-10-11 — End: 2023-10-11
  Administered 2023-10-11: 40 mg via INTRAVENOUS

## 2023-10-11 MED ORDER — FENTANYL CITRATE (PF) 250 MCG/5ML IJ SOLN
INTRAMUSCULAR | Status: DC | PRN
  Administered 2023-10-11: 50 ug via INTRAVENOUS

## 2023-10-11 MED ORDER — PREDNISONE 10 MG PO TABS
10.0000 mg | ORAL_TABLET | Freq: Every day | ORAL | Status: AC
  Administered 2023-10-11: 10 mg
  Filled 2023-10-11: qty 1

## 2023-10-11 MED ORDER — MIDAZOLAM HCL 2 MG/2ML IJ SOLN
INTRAMUSCULAR | Status: DC | PRN
  Administered 2023-10-11: 2 mg via INTRAVENOUS

## 2023-10-11 MED ORDER — OSMOLITE 1.5 CAL PO LIQD
1000.0000 mL | ORAL | Status: DC
  Administered 2023-10-12 – 2023-10-18 (×8): 1000 mL
  Filled 2023-10-11 (×9): qty 1000

## 2023-10-11 MED ORDER — DEXTROSE 50 % IV SOLN
INTRAVENOUS | Status: AC
  Administered 2023-10-11: 50 mL
  Filled 2023-10-11: qty 50

## 2023-10-11 MED ORDER — METOPROLOL TARTRATE 25 MG PO TABS
25.0000 mg | ORAL_TABLET | Freq: Two times a day (BID) | ORAL | Status: DC
  Administered 2023-10-12 – 2023-10-26 (×29): 25 mg
  Filled 2023-10-11 (×29): qty 1

## 2023-10-11 SURGICAL SUPPLY — 32 items
BAG COUNTER SPONGE SURGICOUNT (BAG) ×1 IMPLANT
BENZOIN TINCTURE PRP APPL 2/3 (GAUZE/BANDAGES/DRESSINGS) IMPLANT
BLADE CLIPPER SURG (BLADE) IMPLANT
CANISTER SUCT 3000ML PPV (MISCELLANEOUS) ×1 IMPLANT
CLEANER TIP ELECTROSURG 2X2 (MISCELLANEOUS) ×1 IMPLANT
COVER SURGICAL LIGHT HANDLE (MISCELLANEOUS) ×1 IMPLANT
DRAPE HALF SHEET 40X57 (DRAPES) IMPLANT
ELECT COATED BLADE 2.86 ST (ELECTRODE) ×1 IMPLANT
ELECT REM PT RETURN 9FT ADLT (ELECTROSURGICAL) ×1 IMPLANT
ELECTRODE REM PT RTRN 9FT ADLT (ELECTROSURGICAL) ×1 IMPLANT
GAUZE 4X4 16PLY ~~LOC~~+RFID DBL (SPONGE) ×1 IMPLANT
GLOVE ECLIPSE 7.5 STRL STRAW (GLOVE) ×1 IMPLANT
GOWN STRL REUS W/ TWL LRG LVL3 (GOWN DISPOSABLE) ×2 IMPLANT
KIT BASIN OR (CUSTOM PROCEDURE TRAY) ×1 IMPLANT
KIT TURNOVER KIT B (KITS) ×1 IMPLANT
NDL PRECISIONGLIDE 27X1.5 (NEEDLE) ×1 IMPLANT
NEEDLE PRECISIONGLIDE 27X1.5 (NEEDLE) ×1 IMPLANT
NS IRRIG 1000ML POUR BTL (IV SOLUTION) ×1 IMPLANT
PAD ARMBOARD POSITIONER FOAM (MISCELLANEOUS) ×2 IMPLANT
PENCIL FOOT CONTROL (ELECTRODE) ×1 IMPLANT
SPIKE FLUID TRANSFER (MISCELLANEOUS) ×1 IMPLANT
SUT CHROMIC 2 0 SH (SUTURE) ×1 IMPLANT
SUT ETHILON 3 0 PS 1 (SUTURE) ×1 IMPLANT
SUT SILK 4 0 TIE 10X30 (SUTURE) ×1 IMPLANT
SUT SILK 4-0 18XBRD TIE 12 (SUTURE) ×1 IMPLANT
SYR 20ML LL LF (SYRINGE) ×1 IMPLANT
SYR CONTROL 10ML LL (SYRINGE) IMPLANT
TOWEL GREEN STERILE FF (TOWEL DISPOSABLE) ×1 IMPLANT
TRAY ENT MC OR (CUSTOM PROCEDURE TRAY) ×1 IMPLANT
TUBE CONNECTING 12X1/4 (SUCTIONS) ×1 IMPLANT
TUBE TRACH FLEX 8.5 CUFF (MISCELLANEOUS) IMPLANT
WATER STERILE IRR 1000ML POUR (IV SOLUTION) ×1 IMPLANT

## 2023-10-11 NOTE — Anesthesia Postprocedure Evaluation (Signed)
 Anesthesia Post Note  Patient: Bernarda Caffey Zubiate  Procedure(s) Performed: CREATION, TRACHEOSTOMY (Neck)     Patient location during evaluation: SICU Anesthesia Type: General Level of consciousness: sedated Pain management: pain level controlled Vital Signs Assessment: post-procedure vital signs reviewed and stable Respiratory status: patient on ventilator - see flowsheet for VS Cardiovascular status: stable Postop Assessment: no apparent nausea or vomiting Anesthetic complications: no  No notable events documented.  Last Vitals:  Vitals:   10/11/23 1000 10/11/23 1100  BP: 124/61 (!) 122/35  Pulse: 85 (!) 101  Resp: 20 (!) 23  Temp:    SpO2: 100% 99%    Last Pain:  Vitals:   10/11/23 0724  TempSrc: Axillary  PainSc:                  Shelton Silvas

## 2023-10-11 NOTE — Progress Notes (Addendum)
 Nutrition Follow-up  DOCUMENTATION CODES:  Obesity unspecified  INTERVENTION:  Continue tube feeding via cortrak tube: Osmolite 1.5 at 18mL/h x 18 hours (1530 ml per day, infuse from 12PM to 6AM) Ensure that tube feed is held 2 hours prior to Tivicay and 1 hour after (from 8:30AM-11:30AM) Prosource TF20 60 ml 1x/d Free water flushes q4 hours Provides 2375 kcal, 116 gm protein, 1166 ml free water daily (TF + flush = 2366 mL free water) MVI with minerals daily  NUTRITION DIAGNOSIS:  Increased nutrient needs related to chronic illness as evidenced by estimated needs. - remains applicable  GOAL:  Patient will meet greater than or equal to 90% of their needs - progressing, TF meeting 100% of needs  MONITOR:  TF tolerance  REASON FOR ASSESSMENT:  Consult Enteral/tube feeding initiation and management  ASSESSMENT:  48 y.o. adult with medical history significant of diabetes type 2, seizure disorder, HIV untreated, obesity, polysubstance abuse .  Patient is admitted for pneumonia with hypoxia in the setting of HIV.  3/16 - Admitted to [facility] for PNA 3/18 - MBS showed chronic aspiration of secretions, no diet formally recommended, MRI showed acute CVA 3/19 - PEA arrest and intubation 3/20 - transferred to [facility] for continuous EEG monitoring 3/21 - cortrak tube placed, pt self-extubated, emergently reintubated 3/31 - tracheostomy placed in OR with ENT  Pt out of room at the time of assessment, undergoing trach placement in OR with ENT. Noted that pt's TF infusion rate was increased over the weekend due to pt reporting hunger. Will adjust time frame of feeding to account for added kcal and protein to avoid overfeeding. Will attempt to transition to bolus feeds once pt has larger bore size tube.  Discussed possibility of PEG placement with attending. Will monitor for next venue of care to determine if PEG needs to be placed prior to discharge or if pt can be monitored for  progress towards PO diet eventually.   MV: 7.6 L/min Temp (24hrs), Avg:99.8 F (37.7 C), Min:98.7 F (37.1 C), Max:101.9 F (38.8 C) MAP (cuff):  Admit weight: 115.8 kg  Current weight: 116.5 kg   Intake/Output Summary (Last 24 hours) at 10/11/2023 1309 Last data filed at 10/11/2023 1200 Gross per 24 hour  Intake 1874.89 ml  Output 2150 ml  Net -275.11 ml  Net IO Since Admission: 7,528.28 mL [10/11/23 1309]  Drains/Lines: Cortrak UOP x 24 hours  Nutritionally Relevant Medications: Scheduled Meds:  atorvastatin  20 mg Per Tube Daily   atovaquone  1,500 mg Per Tube Q lunch   dolutegravir  50 mg Per Tube Daily   emtricitabine-tenofovir AF  1 tablet Per Tube Daily   famotidine  20 mg Per Tube Daily   PROSource TF20  60 mL Per Tube Daily   fluconazole  400 mg Per Tube Daily   free water  100 mL Per Tube Q4H   insulin aspart  0-20 Units Subcutaneous Q4H   insulin aspart  13 Units Subcutaneous Q4H   insulin glargine-yfgn  30 Units Subcutaneous BID   multivitamin with minerals  1 tablet Per Tube Daily   polyethylene glycol  17 g Per Tube Daily   senna  2 tablet Per Tube QHS   Continuous Infusions:  feeding supplement (OSMOLITE 1.5 CAL) 1,000 mL (10/11/23 1300)   PRN Meds: bisacodyl, ondansetron  Labs Reviewed: BUN 33 CBG ranges from 47-288 mg/dL over the last 24 hours HgbA1c 10.8% (3/19)   NUTRITION - FOCUSED PHYSICAL EXAM:  Flowsheet Row Most Recent Value  Orbital Region No depletion  Upper Arm Region No depletion  Thoracic and Lumbar Region No depletion  Buccal Region No depletion  Temple Region Moderate depletion  Clavicle Bone Region No depletion  Clavicle and Acromion Bone Region No depletion  Scapular Bone Region No depletion  Dorsal Hand No depletion  Patellar Region No depletion  Anterior Thigh Region No depletion  Posterior Calf Region No depletion  Edema (RD Assessment) Mild  Hair Reviewed  Eyes Reviewed  Mouth Reviewed  Skin Reviewed   Nails --  [broken and peeling]    Diet Order:   Diet Order             Diet NPO time specified  Diet effective now                   EDUCATION NEEDS:  Education needs have been addressed  Skin:  Skin Assessment: Reviewed RN Assessment  Last BM:  3/29 - type 6  Height:  Ht Readings from Last 1 Encounters:  10/07/23 6' (1.829 m)   Weight:  Wt Readings from Last 1 Encounters:  10/11/23 116.5 kg   Ideal Body Weight:  80.9 kg  BMI:  Body mass index is 34.83 kg/m.  Estimated Nutritional Needs:  Kcal:  2200-2500 kcal/d Protein:  110-120g Fluid:  2.4L/day    Greig Castilla, RD, LDN Registered Dietitian II Please reach out via secure chat

## 2023-10-11 NOTE — Progress Notes (Signed)
 PT Cancellation Note  Patient Details Name: Jeremiah Stevens MRN: [id number] DOB: Jun 20, 1976   Cancelled Treatment:    Reason Eval/Treat Not Completed: Patient at procedure or test/unavailable. Pt down for trach placement.   Angelina Ok [employer] [facility] 10/11/2023, 11:14 AM Skip Mayer PT Acute [employer] [phone]

## 2023-10-11 NOTE — Interval H&P Note (Signed)
 History and Physical Interval Note:  10/11/2023 11:02 AM  Jeremiah Stevens  has presented today for surgery, with the diagnosis of stroke/failure to ween.  The various methods of treatment have been discussed with the patient and family. After consideration of risks, benefits and other options for treatment, the patient has consented to  Procedure(s): CREATION, TRACHEOSTOMY (N/A) as a surgical intervention.  The patient's history has been reviewed, patient examined, no change in status, stable for surgery.  I have reviewed the patient's chart and labs.  Questions were answered to the patient's satisfaction.     Serena Colonel

## 2023-10-11 NOTE — Inpatient Diabetes Management (Signed)
 Inpatient Diabetes Program Recommendations  AACE/ADA: New Consensus Statement on Inpatient Glycemic Control (2015)  Target Ranges:  Prepandial:   less than 140 mg/dL      Peak postprandial:   less than 180 mg/dL (1-2 hours)      Critically ill patients:  140 - 180 mg/dL   Lab Results  Component Value Date   GLUCAP 47 (L) 10/11/2023   HGBA1C 10.8 (H) 09/29/2023    Inpatient Diabetes Program Recommendations:   Noted Semglee decreased to 30 units bid. Please consider: -Decrease Novolog correction to 0-9 units q 4 hrs.  Thank you, Jeremiah Stevens. Laddie Math, RN, MSN, CDCES  Diabetes Coordinator Inpatient Glycemic Control Team Team Pager [phone] (8am-5pm) 10/11/2023 12:44 PM

## 2023-10-11 NOTE — Plan of Care (Signed)
  Problem: Metabolic: Goal: Ability to maintain appropriate glucose levels will improve Outcome: Progressing   Problem: Nutritional: Goal: Maintenance of adequate nutrition will improve Outcome: Progressing Goal: Progress toward achieving an optimal weight will improve Outcome: Progressing   Problem: Skin Integrity: Goal: Risk for impaired skin integrity will decrease Outcome: Progressing   Problem: Tissue Perfusion: Goal: Adequacy of tissue perfusion will improve Outcome: Progressing   Problem: Clinical Measurements: Goal: Ability to maintain clinical measurements within normal limits will improve Outcome: Progressing Goal: Will remain free from infection Outcome: Progressing Goal: Diagnostic test results will improve Outcome: Progressing Goal: Respiratory complications will improve Outcome: Progressing Goal: Cardiovascular complication will be avoided Outcome: Progressing   Problem: Nutrition: Goal: Adequate nutrition will be maintained Outcome: Progressing   Problem: Coping: Goal: Level of anxiety will decrease Outcome: Progressing   Problem: Elimination: Goal: Will not experience complications related to bowel motility Outcome: Progressing Goal: Will not experience complications related to urinary retention Outcome: Progressing   Problem: Skin Integrity: Goal: Risk for impaired skin integrity will decrease Outcome: Progressing   Problem: Safety: Goal: Ability to remain free from injury will improve Outcome: Progressing   Problem: Pain Managment: Goal: General experience of comfort will improve and/or be controlled Outcome: Progressing   Problem: Respiratory: Goal: Ability to maintain adequate ventilation will improve Outcome: Progressing Goal: Ability to maintain a clear airway will improve Outcome: Progressing   Problem: Coping: Goal: Will verbalize positive feelings about self Outcome: Progressing Goal: Will identify appropriate support  needs Outcome: Progressing   Problem: Respiratory: Goal: Ability to maintain a clear airway and adequate ventilation will improve Outcome: Progressing

## 2023-10-11 NOTE — Progress Notes (Signed)
 NAME:  Jeremiah Stevens, MRN:  [id number], DOB:  1975/10/29, LOS: 15 ADMISSION DATE:  09/26/2023, CONSULTATION DATE:  09/29/23 REFERRING MD:  Rennis Chris CHIEF COMPLAINT:  Hypoxia   History of Present Illness:  Pt is encephelopathic; therefore, this HPI is obtained from chart review. Jeremiah Stevens is a 48 y.o. adult who has a PMH as below including but not limited to DM2, seizures, HIV AIDS untreated, cryptococcal disease, obesity, polysubstance abuse, prior CVA. She was admitted 09/26/23 with dysphagia x 3 days along with headaches and drooling. He was found to be hypoxic to 80s on room air.  CTA was negative for PE but showed multifocal PNA. He was started on abx and BD's. Biktarvy was continued and ID was consulted.   3/18 He had right facial droop, right sided weakness, dysarthria. Neurology was consulted and recommended CTA and MRI. MRI demonstrated acute right medullary infarct.  Despite ongoing dysphagia (MBS showed moderately severe pharyngeal phase dysphagia and imaging showed unilateral bulging on right), pt insisted on eating. He reportedly ordered pizza overnight 3/18 and certainly aspirated. He had threatened to leave AMA despite stroke workup ongoing.  3/19, he had hypoxia for which rapid response was called. While being assess, he continued to desaturate down into the 60s and then 40s. He required BVM and was transferred to the ICU for further evaluation and management. Upon arrival in the ICU, she had ongoing desaturations and bradycardia before developing PEA arrest.  He had 9 minutes ACLS prior to ROSC including epi x 4 and bicarb x 2. During intubation attempt, she had copious food material and emesis in her oropharynx. Visualization of vocal cords was difficult due to amount of food material and emesis despite suctioning. On 2nd attempt, ETT was passed successfully and saturations improved shortly thereafter. There was grand mal seizure activity noted after intubation that  abated after Midazolam administration.  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[facility]); Non-suicidal depressed mood; Gender dysphoria; RLL pneumonia; AKI (acute kidney injury) (HCC); Uncontrolled type 2 diabetes mellitus with hyperglycemia, without long-term current use of insulin (HCC); Tobacco abuse; HIV infection (HCC); Positive RPR test; Hyperosmolar hyperglycemic state (HHS) (HCC); Seizure (HCC); Acute hypoxic respiratory failure (HCC); Acute encephalopathy; Hyperammonemia (HCC); Pancytopenia (HCC); Cryptococcosis (HCC); CAP (community acquired pneumonia); Globus sensation; Aspiration pneumonia of both lungs due to vomit (HCC); Cardiac arrest, cause unspecified (HCC); Cerebrovascular accident (CVA) (HCC); Seizures (HCC); and 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  Significant Hospital Events: Including procedures, antibiotic start and stop dates in addition to other pertinent events   3/16 admit 3/18 neuro consult, found to have acute CVA 3/19 Ongoing aspiration despite obvious dysphagia, was planning to leave AMA but sister talked pt into staying. then had a massive aspiration event leading to worse hypoxia and PEA arrest. Txf to [facility] 3/20 PSV cumulative total of 5-6 hrs, following some commands/ purposefully 3/21 self extubated, was reintubated 3/23 Failed SBT due to WOB 3/24 Failed SBT due to apnea 3/25 Tolerated PS >4 hours 3/26: Intubated. 3/31: S/p tracheostomy with ENT.  Interim History / Subjective:  Overnight he had episodes of bradycardia , so dexmedetomidine dose was decreased.  This morning, had a fever to 101.  Holding any diltiazem and metoprolol  due to low HR overnight.  Objective:  Blood pressure 114/69, pulse 86, temperature (!) 101.9 F (38.8 C), temperature source Axillary, resp. rate 20, height 6' (1.829 m), weight  104.9 kg,  SpO2 100%.    Vent Mode: PRVC FiO2 (%):  [30 %] 30 % Set Rate:  [12 bmp] 12 bmp Vt Set:  [470 mL] 470 mL PEEP:  [8 cmH20] 8 cmH20 Plateau Pressure:  [16 cmH20-20 cmH20] 20 cmH20   Intake/Output Summary (Last 24 hours) at 10/11/2023 0844 Last data filed at 10/11/2023 1610 Gross per 24 hour  Intake 2208.66 ml  Output 2150 ml  Net 58.66 ml   Filed Weights   10/07/23 0456 10/08/23 0500 10/09/23 0434  Weight: 106.3 kg 114.2 kg 104.9 kg   Physical Exam: General: Ill-appearing obese man, ventilated, comfortable on Precedex HENT: ET tube in place, minimal secretions Respiratory: Coarse bilaterally, decreased at both bases.  No wheezes, no crackles Cardiovascular: Regular, no murmur GI: Obese, protuberant, positive bowel sounds Extremities: No edema Neuro: Sleepy on Precedex 0.9, does wake up to voice, nods to questions, interacts, follows commands GU: External foley in place  Labs/imaging personally reviewed:  CTA chest 3/16 > no PE, multifocal PNA. CT head 3/18 > neg. MRI brain 3/19 > acute right medullary infarct, cytotoxic edema with no hemorrhage or mass effect, small chronic right cerebellar infarcts. CTA head/neck 3/19 > no LVO, advanced for age atherosclerosis of L vertebral artery and prox basilar artery, possible mod L V4 stenosis.  Assessment & Plan:  Acute hypoxic respiratory failure Multifocal PNA, CAP  Possible pneumocystis pneumonia Aspiration event Dysphagia  Tobacco use Intermittent fevers  likely 2/2 to Precedex use. Negative blood and urine Cx's.  Scheduled with ENT for tracheostomy today, due to difficulty weaning off ventilator.  - Restart Plavix and aspirin after tracheostomy. - Ceftriaxone and metronidazole for aspiration pneumonia - PJP PCR was negative, on prophylactic atovaquone and rapid steroid taper.    Acute R medullary infarct, cytotoxic edema Hx small R cerebellar infarcts, R PICA territory infarct  Encephalopathy, multifactorial P -Continue  statin -Restart aspirin  - PT/OT  PEA arrest, presumed primary pulmonary arrest Sz like activity  -reportedly purposeful movements 3/20  P -cEEG per neuro, stopped 3/22> no seizures noted -Keppra 500 mg twice daily for now, need to determine duration since seizures occurred in the setting of his arrest -Neuroprotective measures  HFpEF Hx HTN HLD Intermittent bradycardia, could be secondary to Precedex use. Holding diltiazem and metoprolol as above  - Will restart 4/1 Continue statin  DM2, with hyperglycemia, on steroids  Fasting BG 120, Will hold glargine due to NPO status.   - Decrease Novolog 0-9 units q 4 hrs  - Hold Semglee 30 units  Normocytic anemia -Follow intermittent CBC  HIV / AIDS Hx +cryptococcal antigen  VL 3410 CD4 36 P -Appreciate ID input -Continue to follow for evidence IRIS as he is not compliant with his HAART outpatient -Continue Descovy, Tivicay as ordered -Continue chronic fluconazole -PJP prophylaxis initiated 3/29 (PCR negative)  Hyperthyroidism r/o  -TSH low T3 normal, T4 normal   THC use  -Cessation counseling when able  Code status - iPAL note 3/19 - DNR status - 3/21 patient self extubated and family reversed to full code - 3/25. Patient and sister agreed to plan for extubation and re-intubation. Would want trach if indicated in the future  Best practice (evaluated daily):  Diet/type: NPO DVT prophylaxis: SCD Pressure ulcer(s): pressure ulcer assessment deferred  GI prophylaxis: PPI Lines: N/A Foley:  Yes, and it is still needed Code Status:  full code Last date of multidisciplinary goals of care discussion: 3/25. Patient and sister agreed to plan for extubation  and re-intubation. Would want trach  Laretta Bolster, M.D.  Internal Medicine Resident, PGY-1 [facility]  Pager: [phone] 9:47 AM, 10/11/2023

## 2023-10-11 NOTE — Transfer of Care (Addendum)
 Immediate Anesthesia Transfer of Care Note  Patient: Jeremiah Stevens  Procedure(s) Performed: CREATION, TRACHEOSTOMY (Neck)  Patient Location: ICU  Anesthesia Type:General  Level of Consciousness: sedated  Airway & Oxygen Therapy: Patient remains intubated per anesthesia plan and Patient placed on Ventilator (see vital sign flow sheet for setting)  Post-op Assessment: Report given to RN and Post -op Vital signs reviewed and stable  Post vital signs: Reviewed and stable  Last Vitals:  Vitals Value Taken Time  BP 114/74   Temp    Pulse 92   Resp 18   SpO2 99%     Last Pain:  Vitals:   10/11/23 0724  TempSrc: Axillary  PainSc:       Patients Stated Pain Goal: 0 (10/05/23 0400)  Complications: No notable events documented.

## 2023-10-11 NOTE — Progress Notes (Addendum)
 eLink Physician-Brief Progress Note Patient Name: Jeremiah Stevens DOB: August 08, 1975 MRN: [id number]   Date of Service  10/11/2023  HPI/Events of Note  RN reports anxiety.  Apparently patient developed bradycardia so dexmedetomidine dose was decreased.  eICU Interventions  Evaluated patient by video after a brief delay because of other medical emergencies.  She was asleep and resting comfortably.  At this point, RN had increased dexmedetomidine back to her previous dose without bradycardia occurring.  Will leave her on dexmedetomidine overnight. Discussed with RN.     Intervention Category Minor Interventions: Agitation / anxiety - evaluation and management  Addendum: Patient still having transient episodes of bradycardia.  No hemodynamic instability associated with it.  Remains on dexmedetomidine. No intervention needed because patient is hemodynamically stable. Informed RN.  Carilyn Goodpasture 10/11/2023, 12:07 AM

## 2023-10-11 NOTE — TOC Progression Note (Addendum)
 Transition of Care [facility]) - Progression Note    Patient Details  Name: Jeremiah Stevens MRN: [id number] Date of Birth: May 03, 1976  [facility] CM/SW Contact  Marliss Coots, LCSW Phone Number: 10/11/2023, 12:50 PM  Clinical Narrative:     12:50 PM CSW contacted [facility] admissions regarding referral. Admissions informed CSW that they would review and follow up with decision but confirmed receipt of referral.  12:55 PM [facility] informed CSW that they did not receive referral and requested CSW to refax referral [phone]). CSW refaxed referral.  3:04 PM [facility] admissions called CSW regarding referral. Admissions informed CSW that they do not accept patients that will need suctioning or a vent.  Expected Discharge Plan: IP Rehab Facility Barriers to Discharge: Continued Medical Work up  Expected Discharge Plan and Services In-house Referral: Clinical Social Work   Post Acute Care Choice: NA Living arrangements for the past 2 months: Single Family Home                 DME Arranged: N/A DME Agency: NA                   Social Determinants of Health (SDOH) Interventions SDOH Screenings   Food Insecurity: No Food Insecurity (09/27/2023)  Housing: Low Risk  (09/27/2023)  Transportation Needs: Unmet Transportation Needs (09/27/2023)  Utilities: Not At Risk (09/27/2023)  Depression (PHQ2-9): Low Risk  (05/22/2020)  Tobacco Use: High Risk (10/11/2023)    Readmission Risk Interventions     No data to display

## 2023-10-11 NOTE — Plan of Care (Signed)
  Problem: Ischemic Stroke/TIA Tissue Perfusion: Goal: Complications of ischemic stroke/TIA will be minimized 10/11/2023 0640 by Laban Emperor, RN Outcome: Progressing 10/11/2023 0529 by Laban Emperor, RN Outcome: Progressing   Problem: Education: Goal: Knowledge of disease or condition will improve 10/11/2023 0640 by Laban Emperor, RN Outcome: Progressing 10/11/2023 0529 by Laban Emperor, RN Outcome: Progressing Goal: Knowledge of secondary prevention will improve (MUST DOCUMENT ALL) 10/11/2023 0640 by Laban Emperor, RN Outcome: Progressing 10/11/2023 0529 by Laban Emperor, RN Outcome: Progressing Goal: Knowledge of patient specific risk factors will improve (DELETE if not current risk factor) 10/11/2023 0640 by Laban Emperor, RN Outcome: Progressing 10/11/2023 0529 by Laban Emperor, RN Outcome: Progressing

## 2023-10-11 NOTE — Op Note (Signed)
 10/11/2023 11:53 AM   PATIENT:  Jeremiah Stevens, 48 y.o. adult  PRE-OPERATIVE DIAGNOSIS:  stroke/failure to wean  POST-OPERATIVE DIAGNOSIS:  stroke/failure to wean   PROCEDURE:  Procedure(s): TRACHEOSTOMY  SURGEON:  Surgeon(s): Susy Frizzle, MD  ASSISTANTS: none   ANESTHESIA:   general  EBL: Minimal   DRAINS: none   LOCAL MEDICATIONS USED:  NONE  COUNTS CORRECT:  YES  PROCEDURE DETAILS: Patient was taken to the operating room and placed on the operating table in the supine position. A shoulder roll was placed for positioning. The patient was previously orally intubated. The neck was prepped and draped in a standard fashion. A vertical incision was created just above the sternal notch using electrocautery. The midline fascia was divided. The isthmus of the thyroid was reflected superiorly and the upper trachea was exposed. A tracheotomy was created between the second and third tracheal rings in a horizontal fashion. A lower tracheal flap was created with scissors and the flap was sutured to the cervical skin using 2-0 chromic suture. The orotracheal tube was removed. The #8 Shiley tracheostomy tube was placed without difficulty and the cuff was inflated. The shield was secured to the neck using a Velcro straps and nylon suture. The patient was then transferred back to the intensive care unit in critical condition.  PLAN OF CARE: Transfer to ICU  PATIENT DISPOSITION:  ICU - hemodynamically stable.

## 2023-10-11 NOTE — Anesthesia Procedure Notes (Signed)
 Date/Time: 10/11/2023 11:15 AM  Performed by: Gwenyth Allegra, CRNAPatient Re-evaluated:Patient Re-evaluated prior to induction Oxygen Delivery Method: Circle system utilized Preoxygenation: Pre-oxygenation with 100% oxygen Induction Type: IV induction

## 2023-10-11 NOTE — Progress Notes (Addendum)
 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  Labs remarkable for WBC 10.4, hemoglobin 11   Assessment 48 year old transgender male with history of AIDS, DM 2, HTN, HLD, left lateral malleolar ankle fracture, history of recent cryptococcal antigenemia 9 w/o meningitis and possible pulmonary source) admitted with CVA followed by hypoxia and cardiac arrest leading to seizures with aspiration pneumonia  # Vent dependent respiratory failure/aspiration pneumonia -Status post completion of treatment for aspiration pneumonia -Seen by ENT and plan for trach today -Empiric PJP pneumonia treatment has been stopped after negative pneumocystis PCR.  Pneumocystis smear pending   # Cryptococcal antigenemia -on fluconazole 400mg  IV daily, planned for 10 weeks course previously ( start date was 2/27) but expect will need 6-12 months course for cryptococcal antigenemia with presumed pulmonary source - can be determined on OP fu  # HIV/AIDs - continue descovy/tivicay and  atovaquone for PJP ppx  # Fevers - one episode, no leukocytosis or new signs of infection, could be due to precedex.   Recommendation -Continue p.o. fluconazole 400 mg daily on dischare QTc 385 -Continue Descovy/Tivicay  -Continue atovaquone for PJP prophylaxis, Steroid taper per PCCM -Monitor CBC and CMP -Universal/standard isolation precautions  -Patient has a fu appointment @ [facility] on 4/10 at 4 pm -ID available as needed, recall back with questions or concerns.   Rest of the management as per the primary team. Thank you for the consult. Please page with pertinent questions or concerns.  ______________________________________________________________________ Subjective patient seen and examined at the bedside.  Does not voice any concerns  Past Medical History:  Diagnosis Date   Cancer (HCC)    seizures   Diabetes (HCC)    Diabetes mellitus without complication (HCC)    Heartburn    occasional; OTC as needed   Herpes genitalis in men    HIV (human immunodeficiency virus infection) (HCC)    HIV disease (HCC)    HTN (hypertension)    Hyperlipidemia    Hypertension    under control with med., has been on med. x 1 yr.   Lateral malleolar fracture 09/02/2013   left   Migraines    Tear of deltoid ligament of left ankle 09/02/2013   Type 2 diabetes mellitus with hyperosmolar nonketotic hyperglycemia (HCC) 02/10/2020   Past Surgical History:  Procedure Laterality Date   NO PAST SURGERIES     ORIF ANKLE FRACTURE Left 09/13/2013   Procedure: OPEN REDUCTION INTERNAL FIXATION (ORIF) LEFT LATERAL MALLEOLUS ANKLE FRACTURE ;  Surgeon: Dannielle Huh, MD;  Location: [facility];  Service: Orthopedics;  Laterality: Left;   Vitals BP (!) 122/35   Pulse (!) 101  Temp 99.2 F (37.3 C) (Oral)   Resp (!) 23   Ht 6' (1.829 m)   Wt 116.5 kg   SpO2 99%   BMI 34.83 kg/m     Physical Exam Constitutional: Adult transgender male lying in the bed, not toxic appearing     Comments: Orotracheally intubated  Cardiovascular:     Rate and Rhythm: Normal rate and regular rhythm.     Heart sounds:   Pulmonary:     Effort: Pulmonary effort is normal on vent    Comments:   Abdominal:     Palpations: Abdomen is soft.     Tenderness: Nondistended  Musculoskeletal:        General: No swelling or tenderness in peripheral joints  Skin:    Comments: No rashes  Neurological:     General: Awake, alert and oriented, follows commands, moves all extremities  Pertinent Microbiology Results for orders placed or performed during the hospital encounter of 09/26/23  Blood culture (routine x 2)     Status: None   Collection Time: 09/26/23  9:01 PM   Specimen: BLOOD  Result Value Ref Range Status   Specimen Description   Final    BLOOD LEFT ANTECUBITAL Performed at [facility], [address], [place], [place] [address]    Special Requests   Final    BOTTLES DRAWN AEROBIC AND ANAEROBIC Blood Culture adequate volume Performed at [facility], [address], [place], [place] [address]    Culture   Final    NO GROWTH 5 DAYS Performed at [facility], [address] [place], [place] [address]    Report Status 10/01/2023 FINAL  Final  Blood culture (routine x 2)     Status: None   Collection Time: 09/26/23  9:01 PM   Specimen: BLOOD  Result Value Ref Range Status   Specimen Description   Final    BLOOD RIGHT ANTECUBITAL Performed at [facility], [address], [place], [place] [address]    Special Requests   Final    BOTTLES DRAWN AEROBIC AND ANAEROBIC Blood Culture adequate volume Performed at [facility], [address] [place], [place] [address]    Culture   Final    NO GROWTH 5 DAYS Performed at [facility], [address] [place], [place] [address]    Report Status 10/01/2023 FINAL  Final  Resp panel by RT-PCR (RSV, Flu A&B, Covid)     Status: None    Collection Time: 09/26/23  9:01 PM   Specimen: Nasal Swab  Result Value Ref Range Status   SARS Coronavirus 2 by RT PCR NEGATIVE NEGATIVE Final    Comment: (NOTE) SARS-CoV-2 target nucleic acids are NOT DETECTED.  The SARS-CoV-2 RNA is generally detectable in upper respiratory specimens during the acute phase of infection. The lowest concentration of SARS-CoV-2 viral copies this assay can detect is 138 copies/mL. A negative result does not preclude SARS-Cov-2 infection and should not be used as the sole basis for treatment or other patient management decisions. A negative result may occur with  improper specimen collection/handling, submission of specimen other than nasopharyngeal swab, presence of viral mutation(s) within the areas targeted by this assay, and inadequate number of viral copies(<138 copies/mL). A negative result must be combined with clinical observations, patient history, and epidemiological information. The expected result is Negative.  Fact Sheet for Patients:  BloggerCourse.com  Fact Sheet for Healthcare Providers:  SeriousBroker.it  This  test is no t yet approved or cleared by the [place] and  has been authorized for detection and/or diagnosis of SARS-CoV-2 by FDA under an Emergency Use Authorization (EUA). This EUA will remain  in effect (meaning this test can be used) for the duration of the COVID-19 declaration under Section 564(b)(1) of the Act, 21 U.S.C.section 360bbb-3(b)(1), unless the authorization is terminated  or revoked sooner.       Influenza A by PCR NEGATIVE NEGATIVE Final   Influenza B by PCR NEGATIVE NEGATIVE Final    Comment: (NOTE) The Xpert Xpress SARS-CoV-2/FLU/RSV plus assay is intended as an aid in the diagnosis of influenza from Nasopharyngeal swab specimens and should not be used as a sole basis for treatment. Nasal washings and aspirates are unacceptable for Xpert Xpress  SARS-CoV-2/FLU/RSV testing.  Fact Sheet for Patients: BloggerCourse.com  Fact Sheet for Healthcare Providers: SeriousBroker.it  This test is not yet approved or cleared by the [place] FDA and has been authorized for detection and/or diagnosis of SARS-CoV-2 by FDA under an Emergency Use Authorization (EUA). This EUA will remain in effect (meaning this test can be used) for the duration of the COVID-19 declaration under Section 564(b)(1) of the Act, 21 U.S.C. section 360bbb-3(b)(1), unless the authorization is terminated or revoked.     Resp Syncytial Virus by PCR NEGATIVE NEGATIVE Final    Comment: (NOTE) Fact Sheet for Patients: BloggerCourse.com  Fact Sheet for Healthcare Providers: SeriousBroker.it  This test is not yet approved or cleared by the [place] FDA and has been authorized for detection and/or diagnosis of SARS-CoV-2 by FDA under an Emergency Use Authorization (EUA). This EUA will remain in effect (meaning this test can be used) for the duration of the COVID-19 declaration under Section 564(b)(1) of the Act, 21 U.S.C. section 360bbb-3(b)(1), unless the authorization is terminated or revoked.  Performed at [facility], [address] [place], [place] [address]   MRSA Next Gen by PCR, Nasal     Status: None   Collection Time: 09/27/23  8:10 AM   Specimen: Nasal Mucosa; Nasal Swab  Result Value Ref Range Status   MRSA by PCR Next Gen NOT DETECTED NOT DETECTED Final    Comment: (NOTE) The GeneXpert MRSA Assay (FDA approved for NASAL specimens only), is one component of a comprehensive MRSA colonization surveillance program. It is not intended to diagnose MRSA infection nor to guide or monitor treatment for MRSA infections. Test performance is not FDA approved in patients less than 26 years old. Performed at [facility], [address] [place], [place] [address]   Expectorated Sputum Assessment w Gram Stain, Rflx to Resp Cult     Status: None   Collection Time: 09/27/23  9:04 AM   Specimen: Sputum  Result Value Ref Range Status   Specimen Description SPU  Final   Special Requests Immunocompromised  Final   Sputum evaluation   Final    THIS SPECIMEN IS ACCEPTABLE FOR SPUTUM CULTURE Performed at [facility], [address] [place], [place] [address]    Report Status 09/27/2023 FINAL  Final  Culture, Respiratory w Gram Stain     Status: None   Collection Time: 09/27/23  9:04 AM   Specimen: Sputum  Result Value Ref Range Status   Specimen Description   Final    SPU Performed at [facility], [address] [place], [place] [address]    Special Requests   Final  Immunocompromised Reflexed from [id number] Performed at [facility], [address], [place], [place] [address]    Gram Stain   Final    ABUNDANT WBC PRESENT,BOTH PMN AND MONONUCLEAR MODERATE SQUAMOUS EPITHELIAL CELLS PRESENT MODERATE GRAM POSITIVE COCCI IN PAIRS RARE GRAM NEGATIVE RODS FEW GRAM POSITIVE RODS    Culture   Final    FEW Normal respiratory flora-no Staph aureus or Pseudomonas seen Performed at [facility], [address], [place], [place] [address]    Report Status 09/29/2023 FINAL  Final  Culture, Respiratory w Gram Stain     Status: None   Collection Time: 09/29/23  5:08 PM   Specimen: Tracheal Aspirate; Respiratory  Result Value Ref Range Status   Specimen Description   Final    TRACHEAL ASPIRATE Performed at [facility], [address], [place], [place] [address]    Special Requests   Final    NONE Performed at [facility], [address], [place], [place] [address]    Gram Stain NO WBC SEEN NO ORGANISMS SEEN   Final   Culture   Final    RARE Normal respiratory flora-no Staph aureus or  Pseudomonas seen Performed at [facility], [address] [place], [place] [address]    Report Status 10/02/2023 FINAL  Final  Culture, Respiratory w Gram Stain     Status: None   Collection Time: 09/30/23  1:17 AM   Specimen: Sputum; Respiratory  Result Value Ref Range Status   Specimen Description SPU  Final   Special Requests Immunocompromised  Final   Gram Stain   Final    MODERATE WBC PRESENT, PREDOMINANTLY PMN FEW GRAM POSITIVE COCCI    Culture   Final    Normal respiratory flora-no Staph aureus or Pseudomonas seen Performed at [facility], [address], [place], [place] [address]    Report Status 10/05/2023 FINAL  Final  MRSA Next Gen by PCR, Nasal     Status: None   Collection Time: 09/30/23  1:49 AM   Specimen: Nasal Mucosa; Nasal Swab  Result Value Ref Range Status   MRSA by PCR Next Gen NOT DETECTED NOT DETECTED Final    Comment: (NOTE) The GeneXpert MRSA Assay (FDA approved for NASAL specimens only), is one component of a comprehensive MRSA colonization surveillance program. It is not intended to diagnose MRSA infection nor to guide or monitor treatment for MRSA infections. Test performance is not FDA approved in patients less than 58 years old. Performed at [facility], [address] [place], [place] [address]   Culture, blood (Routine X 2) w Reflex to ID Panel     Status: None (Preliminary result)   Collection Time: 10/07/23 12:14 PM   Specimen: BLOOD LEFT ARM  Result Value Ref Range Status   Specimen Description BLOOD LEFT ARM  Final   Special Requests   Final    BOTTLES DRAWN AEROBIC AND ANAEROBIC Blood Culture results may not be optimal due to an inadequate volume of blood received in culture bottles   Culture   Final    NO GROWTH 4 DAYS Performed at [facility], [address], [place], [place] [address]    Report Status PENDING  Incomplete  Culture, blood (Routine X 2) w Reflex to ID Panel     Status: None (Preliminary  result)   Collection Time: 10/07/23 12:14 PM   Specimen: BLOOD LEFT ARM  Result Value  Ref Range Status   Specimen Description BLOOD LEFT ARM  Final   Special Requests   Final    BOTTLES DRAWN AEROBIC AND ANAEROBIC Blood Culture results may not be optimal due to an inadequate volume of blood received in culture bottles   Culture   Final    NO GROWTH 4 DAYS Performed at [facility], [address], [place], [place] [address]    Report Status PENDING  Incomplete   Pertinent Lab.    Latest Ref Rng & Units 10/11/2023   10:30 AM 10/09/2023    5:08 AM 10/08/2023    2:29 AM  CBC  WBC 4.0 - 10.5 K/uL 10.4  4.9  10.5   Hemoglobin 13.0 - 17.0 g/dL 48.5  46.2  70.3   Hematocrit 39.0 - 52.0 % 34.5  31.6  35.8   Platelets 150 - 400 K/uL 194  180  201       Latest Ref Rng & Units 10/11/2023   10:30 AM 10/10/2023    2:28 AM 10/09/2023    5:08 AM  CMP  Glucose 70 - 99 mg/dL 64  500  938   BUN 6 - 20 mg/dL 33  41  48   Creatinine 0.61 - 1.24 mg/dL 1.82  9.93  7.16   Sodium 135 - 145 mmol/L 144  140  142   Potassium 3.5 - 5.1 mmol/L 4.9  4.4  4.5   Chloride 98 - 111 mmol/L 102  101  101   CO2 22 - 32 mmol/L 33  26  31   Calcium 8.9 - 10.3 mg/dL 9.6  9.2  9.5     Pertinent Imaging today Plain films and CT images have been personally visualized and interpreted; radiology reports have been reviewed. Decision making incorporated into the Impression /   CT HEAD WO CONTRAST ( ) Result Date: 10/07/2023 CLINICAL DATA:  Stroke follow-up. Increased lethargy. Right hemiplegia. EXAM: CT HEAD WITHOUT CONTRAST TECHNIQUE: Contiguous axial images were obtained from the base of the skull through the vertex without intravenous contrast. RADIATION DOSE REDUCTION: This exam was performed according to the departmental dose-optimization program which includes automated exposure control, adjustment of the mA and/or kV according to patient size and/or use of iterative reconstruction technique. COMPARISON:  Head  MRI and CTA 09/29/2023 FINDINGS: Brain: There may be a small hypodense focus in the right medulla in the region of the small acute infarcts shown on recent CT, however streak artifact limits assessment. Artifact also limits assessment of the pons. No acute supratentorial infarct, intracranial hemorrhage, mass, midline shift, or extra-axial fluid collection is identified. A chronic white matter lacunar infarct is again noted near the genu of the corpus callosum on the left. The ventricles are normal in size. Vascular: No hyperdense vessel. Skull: No acute fracture or suspicious lesion. Sinuses/Orbits: Moderate to prominent right and mild left sphenoid sinus mucosal thickening. Mild posterior right ethmoid air cell opacification. Clear mastoid air cells. Unremarkable orbits. Other: Partially visualized endotracheal and enteric tubes. IMPRESSION: 1. Known recent medullary infarct with poor assessment of the brainstem due to artifact. 2. No acute supratentorial infarct or intracranial hemorrhage. Electronically Signed   By: Sebastian Ache M.D.   On: 10/07/2023 15:51   DG Abd 1 View Result Date: 10/03/2023 CLINICAL DATA:  Enteric tube placement EXAM: ABDOMEN - 1 VIEW COMPARISON:  Abdominal radiograph dated 09/29/2023 FINDINGS: Interval replacement of enteric tube with tip projecting over the distal stomach/proximal duodenum. Nonspecific partially imaged gas-filled bowel loops in  the upper abdomen. Partially imaged lower lungs with dense left retrocardiac opacity. IMPRESSION: Interval replacement of enteric tube with tip projecting over the distal stomach/proximal duodenum. Electronically Signed   By: Agustin Cree M.D.   On: 10/03/2023 11:57   DG Chest Port 1 View Result Date: 10/01/2023 CLINICAL DATA:  Intubated EXAM: PORTABLE CHEST 1 VIEW COMPARISON:  09/30/2023, 09/29/2023, 09/26/2023 FINDINGS: Endotracheal tube tip is partially obscured by feeding tube. Tip appears to be about 3.7 cm superior to carina. Enteric tube  tip below the diaphragm but incompletely assessed. Cardiomegaly with worsening bibasilar airspace disease. No pleural effusion or pneumothorax. IMPRESSION: 1. Endotracheal tube tip partially obscured by feeding tube. Tip appears to be about 3.7 cm superior to carina. 2. Cardiomegaly with worsening bibasilar airspace disease, this could be due to atelectasis, pneumonia or aspiration Electronically Signed   By: Jasmine Pang M.D.   On: 10/01/2023 16:20   Overnight EEG with video Result Date: 10/01/2023 Charlsie Quest, MD     10/02/2023  7:17 AM Patient Name: Romario Tith MRN: [id number] Epilepsy Attending: Charlsie Quest Referring Physician/Provider: Caryl Pina, MD  Duration: 09/30/2023 6433 to 10/01/2023 2951 Patient history: 48yo M s/o cardiac arrest with seizure like activity. EEG to evaluate for seizure Level of alertness:  awake, asleep AEDs during EEG study: Propofol, LEV Technical aspects: This EEG study was done with scalp electrodes positioned according to the 10-20 International system of electrode placement. Electrical activity was reviewed with band pass filter of 1-70Hz , sensitivity of 7 uV/mm, display speed of 32mm/sec with a 60Hz  notched filter applied as appropriate. EEG data were recorded continuously and digitally stored.  Video monitoring was available and reviewed as appropriate. Description: The posterior dominant rhythm consists of 8 Hz activity of moderate voltage (25-35 uV) seen predominantly in posterior head regions, symmetric and reactive to eye opening and eye closing. Sleep was characterized by vertex waves, sleep spindles (12 to 14 Hz), maximal frontocentral region.  EEG showed continuous/intermittent generalized polymorphic sharply contoured 3 to 6 Hz theta-delta slowing. Hyperventilation and photic stimulation were not performed.   ABNORMALITY - Intermittent slow, generalized IMPRESSION: This study is suggestive of mild to moderate diffuse encephalopathy. No seizures or  epileptiform discharges were seen throughout the recording. Priyanka Annabelle Harman   Rapid EEG Result Date: 09/30/2023 Charlsie Quest, MD     09/30/2023 11:36 AM Patient Name: Manasseh Pittsley MRN: [id number] Epilepsy Attending: Charlsie Quest Referring Physician/Provider: Kathlene Cote, PA-C Date: 09/29/2023 Duration: 5 hours 54 mins Patient history: 48yo M s/o cardiac arrest with seizure like activity. Rapid EEG to evaluate for seizure Level of alertness:  comatose AEDs during EEG study: Propofol LEV Technical aspects: This EEG was obtained using a 10 lead EEG system positioned circumferentially without any parasagittal coverage (rapid EEG). Computer selected EEG is reviewed as  well as background features and all clinically significant events. Description: EEG showed continuous generalized 3 to 6 Hz theta-delta slowing. Hyperventilation and photic stimulation were not performed.   ABNORMALITY - Continuous slow, generalized IMPRESSION: This limited ceribell EEG is suggestive of moderate to severe diffuse encephalopathy. No seizures or epileptiform discharges were seen throughout the recording. Charlsie Quest   Portable Chest xray Result Date: 09/30/2023 CLINICAL DATA:  Respiratory failure. EXAM: PORTABLE CHEST 1 VIEW COMPARISON:  September 29, 2023. FINDINGS: Stable cardiomediastinal silhouette. Endotracheal and nasogastric tubes are unchanged in position. Left lung is clear. Minimal right basilar subsegmental atelectasis or infiltrate is noted. Bony thorax is unremarkable. IMPRESSION:  Stable support apparatus. Minimal right basilar subsegmental atelectasis or infiltrate is noted. Electronically Signed   By: Lupita Raider M.D.   On: 09/30/2023 10:17   DG Abd 1 View Result Date: 09/29/2023 CLINICAL DATA:  Enteric catheter placement EXAM: ABDOMEN - 1 VIEW COMPARISON:  08/02/2022 FINDINGS: Frontal view of the lower chest and upper abdomen demonstrates enteric catheter passing below diaphragm, tip projecting  over the gastric antrum. Nonspecific gaseous distention of the large and small bowel. IMPRESSION: 1. Enteric catheter tip projecting over the gastric antrum. Electronically Signed   By: Sharlet Salina M.D.   On: 09/29/2023 17:30   DG Chest 1 View Result Date: 09/29/2023 CLINICAL DATA:  Intubated EXAM: CHEST  1 VIEW COMPARISON:  09/29/2023 FINDINGS: Single frontal view of the chest demonstrates endotracheal tube overlying tracheal air column, tip 2.2 cm above carina. Enteric catheter passes below diaphragm, tip excluded by collimation. Cardiac silhouette is enlarged but stable. There is increased pulmonary vascular congestion. No effusion or pneumothorax. Chronic elevation the right hemidiaphragm. No acute bony abnormalities. IMPRESSION: 1. Support devices as above. 2. Enlarged cardiac silhouette, with increased pulmonary vascular congestion. Electronically Signed   By: Sharlet Salina M.D.   On: 09/29/2023 17:28   DG Chest 1 View Result Date: 09/29/2023 CLINICAL DATA:  Shortness of breath. EXAM: CHEST  1 VIEW COMPARISON:  Chest radiograph dated 09/26/2023. FINDINGS: Mild cardiomegaly with mild vascular congestion. No focal consolidation, pleural effusion, pneumothorax. No acute osseous pathology. IMPRESSION: Mild cardiomegaly with mild vascular congestion. Electronically Signed   By: Elgie Collard M.D.   On: 09/29/2023 17:26   ECHOCARDIOGRAM COMPLETE Result Date: 09/29/2023    ECHOCARDIOGRAM REPORT   Patient Name:   MUTASIM TUCKEY Date of Exam: 09/29/2023 Medical Rec #:  [id number]    Height:       72.0 in Accession #:    [id number]   Weight:       255.3 lb Date of Birth:  1975-08-10   BSA:          2.363 m Patient Age:    47 years     BP:           151/98 mmHg Patient Gender: M            HR:           112 bpm. Exam Location:  Inpatient Procedure: 2D Echo, Cardiac Doppler and Color Doppler (Both Spectral and Color            Flow Doppler were utilized during procedure). Indications:    Stroke  History:         Patient has prior history of Echocardiogram examinations. Risk                 Factors:Hypertension and Diabetes.  Sonographer:    Lamont Snowball Referring Phys: [id number] [facility]  Sonographer Comments: Image acquisition challenging due to uncooperative patient. IMPRESSIONS  1. Mild intracavitary gradient. Peak velocity 0.88 m/s. Peak gradient 3.1 mmHg. Left ventricular ejection fraction, by estimation, is 60 to 65%. The left ventricle has normal function. The left ventricle has no regional wall motion abnormalities. There is moderate concentric left ventricular hypertrophy. Left ventricular diastolic parameters are consistent with Grade I diastolic dysfunction (impaired relaxation).  2. Right ventricular systolic function is normal. The right ventricular size is normal. There is normal pulmonary artery systolic pressure.  3. The mitral valve is normal in structure. Trivial mitral valve regurgitation. No evidence of mitral stenosis.  4. The  aortic valve is normal in structure. Aortic valve regurgitation is not visualized. No aortic stenosis is present.  5. The inferior vena cava is normal in size with greater than 50% respiratory variability, suggesting right atrial pressure of 3 mmHg. FINDINGS  Left Ventricle: Mild intracavitary gradient. Peak velocity 0.88 m/s. Peak gradient 3.1 mmHg. Left ventricular ejection fraction, by estimation, is 60 to 65%. The left ventricle has normal function. The left ventricle has no regional wall motion abnormalities. The left ventricular internal cavity size was normal in size. There is moderate concentric left ventricular hypertrophy. Left ventricular diastolic parameters are consistent with Grade I diastolic dysfunction (impaired relaxation). Indeterminate filling pressures. Right Ventricle: The right ventricular size is normal. No increase in right ventricular wall thickness. Right ventricular systolic function is normal. There is normal pulmonary artery systolic  pressure. The tricuspid regurgitant velocity is 1.94 m/s, and  with an assumed right atrial pressure of 3 mmHg, the estimated right ventricular systolic pressure is 18.1 mmHg. Left Atrium: Left atrial size was normal in size. Right Atrium: Right atrial size was normal in size. Pericardium: There is no evidence of pericardial effusion. Mitral Valve: The mitral valve is normal in structure. Trivial mitral valve regurgitation. No evidence of mitral valve stenosis. MV peak gradient, 6.4 mmHg. The mean mitral valve gradient is 3.0 mmHg. Tricuspid Valve: The tricuspid valve is normal in structure. Tricuspid valve regurgitation is trivial. No evidence of tricuspid stenosis. Aortic Valve: The aortic valve is normal in structure. Aortic valve regurgitation is not visualized. No aortic stenosis is present. Aortic valve peak gradient measures 18.5 mmHg. Pulmonic Valve: The pulmonic valve was normal in structure. Pulmonic valve regurgitation is not visualized. No evidence of pulmonic stenosis. Aorta: The aortic root is normal in size and structure. Venous: The inferior vena cava is normal in size with greater than 50% respiratory variability, suggesting right atrial pressure of 3 mmHg. IAS/Shunts: No atrial level shunt detected by color flow Doppler.  LEFT VENTRICLE PLAX 2D LVIDd:         4.50 cm   Diastology LVIDs:         3.00 cm   LV e' medial:    8.27 cm/s LV PW:         1.50 cm   LV E/e' medial:  12.7 LV IVS:        1.40 cm   LV e' lateral:   7.72 cm/s LVOT diam:     2.20 cm   LV E/e' lateral: 13.6 LV SV:         82 LV SV Index:   35 LVOT Area:     3.80 cm  RIGHT VENTRICLE             IVC RV Basal diam:  4.40 cm     IVC diam: 1.40 cm RV S prime:     25.90 cm/s TAPSE (M-mode): 2.8 cm LEFT ATRIUM             Index        RIGHT ATRIUM           Index LA Vol (A2C):   65.4 ml 27.67 ml/m  RA Area:     18.80 cm LA Vol (A4C):   54.6 ml 23.10 ml/m  RA Volume:   52.90 ml  22.38 ml/m LA Biplane Vol: 60.0 ml 25.39 ml/m  AORTIC  VALVE AV Area (Vmax): 2.51 cm AV Vmax:        215.00 cm/s AV Peak Grad:  18.5 mmHg LVOT Vmax:      142.00 cm/s LVOT Vmean:     97.900 cm/s LVOT VTI:       0.217 m  AORTA Ao Root diam: 3.20 cm Ao Asc diam:  3.80 cm MITRAL VALVE                TRICUSPID VALVE MV Area (PHT): 3.55 cm     TR Peak grad:   15.1 mmHg MV Area VTI:   3.27 cm     TR Vmax:        194.00 cm/s MV Peak grad:  6.4 mmHg MV Mean grad:  3.0 mmHg     SHUNTS MV Vmax:       1.26 m/s     Systemic VTI:  0.22 m MV Vmean:      83.0 cm/s    Systemic Diam: 2.20 cm MV E velocity: 105.00 cm/s MV A velocity: 93.80 cm/s MV E/A ratio:  1.12 Chilton Si MD Electronically signed by Chilton Si MD Signature Date/Time: 09/29/2023/4:21:51 PM    Final    CT ANGIO HEAD NECK W WO CM Result Date: 09/29/2023 CLINICAL DATA:  48 year old male with dysphagia and right lateral medullary infarct on MRI. EXAM: CT ANGIOGRAPHY HEAD AND NECK WITH AND WITHOUT CONTRAST TECHNIQUE: Multidetector CT imaging of the head and neck was performed using the standard protocol during bolus administration of intravenous contrast. Multiplanar CT image reconstructions and MIPs were obtained to evaluate the vascular anatomy. Carotid stenosis measurements (when applicable) are obtained utilizing NASCET criteria, using the distal internal carotid diameter as the denominator. RADIATION DOSE REDUCTION: This exam was performed according to the departmental dose-optimization program which includes automated exposure control, adjustment of the mA and/or kV according to patient size and/or use of iterative reconstruction technique. CONTRAST:  75mL OMNIPAQUE IOHEXOL 350 MG/ML SOLN COMPARISON:  Brain MRI this morning 0115 hours.  Head CT yesterday. FINDINGS: CT HEAD Brain: Intermittent motion artifact. The right medullary infarct is not apparent by CT. No acute intracranial hemorrhage identified. No intracranial mass effect or ventriculomegaly. Calvarium and skull base: Stable. Paranasal  sinuses: Visualized paranasal sinuses and mastoids are stable and well aerated. Orbits: No acute orbit or scalp soft tissue finding identified. CTA NECK Skeleton: Carious dentition. No acute osseous abnormality identified. Upper chest: Negative, mild respiratory motion. Other neck: Intermittent motion artifact. Neck soft tissue spaces appear within normal limits. Aortic arch: 3 vessel arch.  Suboptimal aortic arch contrast. Right carotid system: Suboptimal contrast and mild motion artifact but no evidence of plaque or stenosis. Left carotid system: Similar to the opposite side, no evidence of plaque or stenosis. Vertebral arteries: Suboptimal vertebral artery contrast in the neck. The left vertebral artery appears to be dominant. Both distal vertebral arteries are patent. But there is limited detail of the proximal vertebral arteries, especially the non dominant right. CTA HEAD Better although suboptimal intracranial arterial contrast. Posterior circulation: Dominant distal left vertebral artery with bulky calcified plaque remains patent although moderate distal V4 stenosis is possible on series 12, image 168. The non dominant right vertebral artery is patent, but detail of the right vertebrobasilar junction is limited. The basilar artery is tortuous and patent with calcified plaque proximally but no definite basilar stenosis. Basilar tip, SCA and PCA origins appear patent. Posterior communicating arteries are diminutive or absent. Grossly normal bilateral PCA branches. Anterior circulation: Both ICA siphons are patent, although detail is limited. Patent carotid termini, MCA and ACA origins. MCA M1 segments and bifurcations appear patent.  ACA A2 segments appear within normal limits. Grossly normal visible MCA branches. Venous sinuses: Grossly patent. Anatomic variants: Dominant left vertebral artery. Review of the MIP images confirms the above findings IMPRESSION: 1. Suboptimal arterial contrast bolus and  intermittent motion artifact. No large vessel occlusion identified. 2. Positive for advanced for age atherosclerosis of the Dominant Left Vertebral Artery V4 segment and proximal Basilar artery. Possible moderate Left V4 stenosis. 3. Limited detail of the non-dominant right vertebral artery, but seems to remain patent to the Basilar. 4. Limited ICA siphon and circle-of-Willis branch detail. Electronically Signed   By: Odessa Fleming M.D.   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 EXAM: MRI HEAD WITHOUT CONTRAST TECHNIQUE: Multiplanar, multiecho pulse sequences of the brain and surrounding structures were obtained without intravenous contrast. COMPARISON:  Head CT yesterday, and earlier.  Brain MRI 08/27/2023. FINDINGS: Brain: Positive for restricted diffusion in the right lateral medulla near the level of the medullary pyramids series 5, image 8, series 8, image 16. Mild T2 and FLAIR hyperintensity associated with no hemorrhage or mass effect. No other convincing diffusion restriction. But there are small chronic right cerebellar infarcts on the February MRI there. Supratentorial chronic cystic encephalomalacia along the left aspect of the genu of the corpus callosum. And scattered additional periventricular, central and subcortical white matter T2 and FLAIR hyperintensity which was better demonstrated in February. No convincing chronic cerebral blood products on SWI. And deep gray nuclei relatively spared. Vascular: Major intracranial vascular flow voids are stable. Generalized intracranial artery tortuosity, most pronounced in the dominant distal left vertebral artery, basilar. Skull and upper cervical spine: Stable, negative. Sinuses/Orbits: Negative orbits.  Improved paranasal sinus aeration. Other: Mastoids are clear. IMPRESSION: 1. Positive for Acute Right Medullary Infarct. Cytotoxic edema with no  hemorrhage or mass effect. 2. Underlying small chronic right cerebellar infarcts, likely also the right PICA territory. And other stable chronic cerebral white matter disease, generalized intracranial artery tortuosity. Electronically Signed   By: Odessa Fleming M.D.   On: 09/29/2023 05:34   DG Swallowing Func-Speech Pathology Result Date: 09/28/2023 Table formatting from the original result was not included. Images from the original result were not included. Modified Barium Swallow Study Patient Details Name: Ngoc Daughtridge MRN: [id number] Date of Birth: 1976-06-08 Today's Date: 09/28/2023 HPI/PMH: HPI: Patient is a 48 y.o. male with PMH: DM-2, seizure disorder, HIV (untreated), obesity, polysubstance abuse, h/o cryptococcal disease. He presented to the [facility] ED on 09/26/23 with c/o of inability to swallow for the past three days with HA, hiccups and drooling. He was tachypneic in ED on RA and placed on oxygen via nasal cannula. DG neck soft tissue was negative for retropharyngeal soft tissue swelling or epiglottic enlargement, CXR portable suggestive of small airway infection/inflammation. CT angio/chest/PE showed Bilateral mid and lower lung airspace opacities concerning for multifocal pneumonia, less likely edema.  Pt with dysarthria and reports new SUDDEN onset of dysphagia x1 week ago and cough x4 days.  MBS indicated. Clinical Impression: Clinical Impression: Patient presents with mild oral and moderately severe pharyngeal phase dysphagia.  Patient demonstrates significant difficulty initiating swallow - with liquids filling pyriform sinus prior to swallow initiation - for up to 4 seconds with boluses filling pyrifom sinus.  Pt demonstrates penetration of liquids prior to swallow initiation x1 as barium spills into open airway before the swallow. However mostly aspiration is occuring AFTER the swallow as retention  adheres to significant pharyngeal secretionsand spills into airway post-swallow.  She is clearing  approx 75-85% of boluses with swallowing - however post-swallow retention is aspirated.  A multitude of compensation strategies attempted including head turn right *where pt senses retention* and left as well as chin tuck with head turn left did not improve clearance.  Dry swallows helps with clearance but are very difficult for pt to conduct.  PES opening was inadequate allowing retention in pyriform sinus.   Pt is aspirating secretions chronically.  She reports desire to eat - regardless of dysphagia.  Defer to MD for po option with mitigation strategies.   Pt may aspirate less frequently with full liquid - nectar at this time and allow thin water between meals. Factors that may increase risk of adverse event in presence of aspiration Rubye Oaks & Clearance Coots 2021): Factors that may increase risk of adverse event in presence of aspiration Rubye Oaks & Clearance Coots 2021): Aspiration of thick, dense, and/or acidic materials (poor secretion management) Recommendations/Plan: Swallowing Evaluation Recommendations Swallowing Evaluation Recommendations Recommendations: -- (defer to MD, but pt wants to eat) Medication Administration: Other (Comment) Treatment Plan Treatment Plan Treatment recommendations: Therapy as outlined in treatment plan below Follow-up recommendations: Follow physicians's recommendations for discharge plan and follow up therapies Functional status assessment: Patient has had a recent decline in their functional status and demonstrates the ability to make significant improvements in function in a reasonable and predictable amount of time. Treatment frequency: Min 2x/week Treatment duration: 1 week Interventions: Aspiration precaution training; Compensatory techniques; Patient/family education; Trials of upgraded texture/liquids Recommendations Recommendations for follow up therapy are one component of a multi-disciplinary discharge planning process, led by the attending physician.  Recommendations may be updated  based on patient status, additional functional criteria and insurance authorization. Assessment: Orofacial Exam: Orofacial Exam Oral Cavity: Oral Hygiene: Pooled secretions (needing frequent oral suctioning) Oral Cavity - Dentition: Adequate natural dentition Orofacial Anatomy: WFL Oral Motor/Sensory Function: Suspected cranial nerve impairment CN V - Trigeminal: WFL CN VII - Facial: WFL CN IX - Glossopharyngeal, CN X - Vagus: -- (posterior tongue appears elevated on right compared to left with mouth opening) CN XII - Hypoglossal: -- (pt is dysarthric, subjectively appears with right sided weakness more than left- with pushing on right) Anatomy: Anatomy: Other (Comment) (? appearance of unilateral bulging on the right -) Boluses Administered: Boluses Administered Boluses Administered: Thin liquids (Level 0); Mildly thick liquids (Level 2, nectar thick); Moderately thick liquids (Level 3, honey thick); Puree; Solid  Oral Impairment Domain: Oral Impairment Domain Lip Closure: No labial escape Tongue control during bolus hold: Posterior escape of less than half of bolus Bolus preparation/mastication: Slow prolonged chewing/mashing with complete recollection Bolus transport/lingual motion: Brisk tongue motion Oral residue: Trace residue lining oral structures Location of oral residue : Tongue Initiation of pharyngeal swallow : Pyriform sinuses  Pharyngeal Impairment Domain: Pharyngeal Impairment Domain Soft palate elevation: No bolus between soft palate (SP)/pharyngeal wall (PW) Laryngeal elevation: Partial superior movement of thyroid cartilage/partial approximation of arytenoids to epiglottic petiole Anterior hyoid excursion: Partial anterior movement Epiglottic movement: Partial inversion Laryngeal vestibule closure: Incomplete, narrow column air/contrast in laryngeal vestibule Pharyngeal stripping wave : Present - diminished Pharyngeal contraction (A/P view only): Unilateral bulging Pharyngoesophageal segment  opening: Partial distention/partial duration, partial obstruction of flow Tongue base retraction: Narrow column of contrast or air between tongue base and PPW Pharyngeal residue: Collection of residue within or on pharyngeal structures; Trace residue within or on pharyngeal structures Location of pharyngeal residue: Valleculae; Pyriform sinuses;  Aryepiglottic folds; Tongue base  Esophageal Impairment Domain: No data recorded Pill: Pill Consistency administered: -- (DNT) Penetration/Aspiration Scale Score: Penetration/Aspiration Scale Score 1.  Material does not enter airway: Solid 2.  Material enters airway, remains ABOVE vocal cords then ejected out: Puree 5.  Material enters airway, CONTACTS cords and not ejected out: Moderately thick liquids (Level 3, honey thick) 8.  Material enters airway, passes BELOW cords without attempt by patient to eject out (silent aspiration) : Thin liquids (Level 0); Mildly thick liquids (Level 2, nectar thick) Compensatory Strategies: Compensatory Strategies Compensatory strategies: Yes Effortful swallow: Ineffective Ineffective Effortful Swallow: Mildly thick liquid (Level 2, nectar thick); Thin liquid (Level 0) Multiple swallows: Effective Effective Multiple Swallows: Thin liquid (Level 0); Mildly thick liquid (Level 2, nectar thick); Moderately thick liquid (Level 3, honey thick); Puree; Solid (but difficult for pt to perform) Chin tuck: Ineffective Ineffective Chin Tuck: Thin liquid (Level 0); Mildly thick liquid (Level 2, nectar thick) Left head turn: Ineffective Ineffective Left Head Turn: Mildly thick liquid (Level 2, nectar thick); Thin liquid (Level 0) Right head turn: Ineffective Ineffective Right Head Turn: Thin liquid (Level 0); Mildly thick liquid (Level 2, nectar thick) Posterior head tilt: Ineffective Ineffective Posterior head tilt: Moderately thick liquid (Level 3, honey thick); Mildly thick liquid (Level 2, nectar thick) Chin tuck combined with head turn: Ineffective  Ineffective Chin tuck combined with head turn: Mildly thick liquid (Level 2, nectar thick)   General Information: Caregiver present: No  Diet Prior to this Study: NPO   Temperature : Normal   Respiratory Status: WFL (pt coughing on secretions)   Supplemental O2: None (Room air)   History of Recent Intubation: No  Behavior/Cognition: Alert; Cooperative; Pleasant mood Self-Feeding Abilities: Able to self-feed Baseline vocal quality/speech: Abnormal resonance Volitional Cough: Able to elicit Volitional Swallow: -- (with effort, dry swallows are challenging for pt to conduct) Exam Limitations: No limitations Goal Planning: Prognosis for improved oropharyngeal function: Fair No data recorded No data recorded Patient/Family Stated Goal: pt wants to eat Consulted and agree with results and recommendations: Patient Pain: Pain Assessment Pain Assessment: No/denies pain End of Session: Start Time:SLP Start Time (ACUTE ONLY): 1400 Stop Time: SLP Stop Time (ACUTE ONLY): 1445 Time Calculation:SLP Time Calculation (min) (ACUTE ONLY): 45 min Charges: SLP Evaluations $ SLP Speech Visit: 1 Visit SLP Evaluations $BSS Swallow: 1 Procedure $MBS Swallow: 1 Procedure $Swallowing Treatment: 1 Procedure SLP visit diagnosis: SLP Visit Diagnosis: Dysphagia, pharyngoesophageal phase (R13.14); Dysphagia, oropharyngeal phase (R13.12) Past Medical History: Past Medical History: Diagnosis Date  Cancer (HCC)   seizures  Diabetes (HCC)   Diabetes mellitus without complication (HCC)   Heartburn   occasional; OTC as needed  Herpes genitalis in men   HIV (human immunodeficiency virus infection) (HCC)   HIV disease (HCC)   HTN (hypertension)   Hyperlipidemia   Hypertension   under control with med., has been on med. x 1 yr.  Lateral malleolar fracture 09/02/2013  left  Migraines   Tear of deltoid ligament of left ankle 09/02/2013  Type 2 diabetes mellitus with hyperosmolar nonketotic hyperglycemia (HCC) 02/10/2020 Past Surgical History: Past Surgical  History: Procedure Laterality Date  NO PAST SURGERIES    ORIF ANKLE FRACTURE Left 09/13/2013  Procedure: OPEN REDUCTION INTERNAL FIXATION (ORIF) LEFT LATERAL MALLEOLUS ANKLE FRACTURE ;  Surgeon: Dannielle Huh, MD;  Location: [facility];  Service: Orthopedics;  Laterality: Left; A-P view Rolena Infante, MS [facility] SLP Acute Rehab Services Office [phone] Chales Abrahams 09/28/2023, 4:16 PM  CT HEAD WO CONTRAST ( ) Result Date: 09/28/2023 CLINICAL DATA:  48 year old male with increased right side weakness, garbled speech, confusion. Neurologic deficit. Aspiration. EXAM: CT HEAD WITHOUT CONTRAST TECHNIQUE: Contiguous axial images were obtained from the base of the skull through the vertex without intravenous contrast. RADIATION DOSE REDUCTION: This exam was performed according to the departmental dose-optimization program which includes automated exposure control, adjustment of the mA and/or kV according to patient size and/or use of iterative reconstruction technique. COMPARISON:  Brain MRI 08/27/2023.  Head CT 09/25/2023. FINDINGS: Brain: Study is mildly degraded by motion artifact despite repeated imaging attempts. Normal cerebral volume. No midline shift, ventriculomegaly, mass effect, evidence of mass lesion, intracranial hemorrhage or evidence of cortically based acute infarction. Gray-white matter differentiation is within normal limits throughout the brain. Vascular: No suspicious intracranial vascular hyperdensity. Skull: Intact.  No acute osseous abnormality identified. Sinuses/Orbits: Visualized paranasal sinuses and mastoids are stable and well aerated. Other: Chronic Disconjugate gaze. Visualized scalp soft tissues are within normal limits. IMPRESSION: Stable and negative noncontrast CT appearance of the brain when allowing for mildly motion degraded exam. Electronically Signed   By: Odessa Fleming M.D.   On: 09/28/2023 13:58   CT Angio Chest Pulmonary Embolism (PE) W or WO Contrast Result Date:  09/27/2023 CLINICAL DATA:  Shortness of breath, hypoxia EXAM: CT ANGIOGRAPHY CHEST WITH CONTRAST TECHNIQUE: Multidetector CT imaging of the chest was performed using the standard protocol during bolus administration of intravenous contrast. Multiplanar CT image reconstructions and MIPs were obtained to evaluate the vascular anatomy. RADIATION DOSE REDUCTION: This exam was performed according to the departmental dose-optimization program which includes automated exposure control, adjustment of the mA and/or kV according to patient size and/or use of iterative reconstruction technique. CONTRAST:  OMNIPAQUE IOHEXOL 350 MG/ML SOLN COMPARISON:  08/28/2023 FINDINGS: Cardiovascular: Heart is normal size. Aorta is normal caliber. Scattered coronary artery calcifications. No filling defects in the pulmonary arteries to suggest pulmonary emboli. Mediastinum/Nodes: No mediastinal, hilar, or axillary adenopathy. Trachea and thyroid unremarkable. There is wall thickening involving the distal esophagus suggesting esophagitis. Lungs/Pleura: Again noted is the 1.8 cm left lower lobe subpleural nodule medially on image 99, stable since prior study. Airspace disease noted in both lower lobes, right middle lobe and lingula concerning for pneumonia. No effusions. Upper Abdomen: Hepatic steatosis.  No acute findings. Musculoskeletal: Chest wall soft tissues are unremarkable. No acute bony abnormality. Review of the MIP images confirms the above findings. IMPRESSION: No evidence of pulmonary embolus. Bilateral mid and lower lung airspace opacities concerning for multifocal pneumonia, less likely edema. Hepatic steatosis. Scattered coronary artery disease. Electronically Signed   By: Charlett Nose M.D.   On: 09/27/2023 00:11   DG Chest Portable 1 View Result Date: 09/26/2023 CLINICAL DATA:  Shortness of breath, hypoxia EXAM: PORTABLE CHEST 1 VIEW COMPARISON:  Radiograph 08/25/2023 and CT chest 08/28/2023 FINDINGS: Stable  cardiomegaly. Reticulonodular opacities in the lower lungs. Retrocardiac atelectasis or consolidation. No pleural effusion or pneumothorax. No displaced rib fractures. IMPRESSION: Reticulonodular opacities in the lower lungs, suggestive of small airway infection/inflammation. Electronically Signed   By: Minerva Fester M.D.   On: 09/26/2023 21:08   DG Neck Soft Tissue Result Date: 09/26/2023 CLINICAL DATA:  Dysphagia, drooling, concern for epiglottitis EXAM: NECK SOFT TISSUES - 1+ VIEW COMPARISON:  None Available. FINDINGS: There is no evidence of retropharyngeal soft tissue swelling or epiglottic enlargement. The cervical airway is unremarkable and no radio-opaque foreign body identified. IMPRESSION: Negative. Electronically Signed   By: Joselyn Glassman  Stutzman M.D.   On: 09/26/2023 21:06   CT Head Wo Contrast Result Date: 09/25/2023 CLINICAL DATA:  Headache EXAM: CT HEAD WITHOUT CONTRAST TECHNIQUE: Contiguous axial images were obtained from the base of the skull through the vertex without intravenous contrast. RADIATION DOSE REDUCTION: This exam was performed according to the departmental dose-optimization program which includes automated exposure control, adjustment of the mA and/or kV according to patient size and/or use of iterative reconstruction technique. COMPARISON:  08/25/2023 FINDINGS: Brain: No mass,hemorrhage or extra-axial collection. Normal appearance of the parenchyma and CSF spaces. Vascular: No hyperdense vessel or unexpected vascular calcification. Skull: The visualized skull base, calvarium and extracranial soft tissues are normal. Sinuses/Orbits: No fluid levels or advanced mucosal thickening of the visualized paranasal sinuses. No mastoid or middle ear effusion. Normal orbits. Other: None. IMPRESSION: Normal head CT. Electronically Signed   By: Deatra Robinson M.D.   On: 09/25/2023 02:59   I have personally spent  50 minutes involved in face-to-face and non-face-to-face activities for this  patient on the day of the visit. Professional time spent includes the following activities: Preparing to see the patient (review of tests), Obtaining and/or reviewing separately obtained history (admission/discharge record), Performing a medically appropriate examination and/or evaluation , Ordering medications/tests/procedures, referring and communicating with other health care professionals, Documenting clinical information in the EMR, Independently interpreting results (not separately reported), Communicating results to the patient/family/caregiver, Counseling and educating the patient/family/caregiver and Care coordination (not separately reported).   Plan d/w requesting provider as well as ID pharm D  Of note, portions of this note may have been created with voice recognition software. While this note has been edited for accuracy, occasional wrong-word or 'sound-a-like' substitutions may have occurred due to the inherent limitations of voice recognition software.   Electronically signed by:   Odette Fraction, MD Infectious Disease [facility]ician [facility] Infectious Disease Pager: [phone]

## 2023-10-11 NOTE — Anesthesia Preprocedure Evaluation (Addendum)
 Anesthesia Evaluation  Patient identified by MRN, date of birth, ID band  Reviewed: Allergy & Precautions, Patient's Chart, lab work & pertinent test results, Unable to perform ROS - Chart review only  Airway Mallampati: Intubated       Dental no notable dental hx.    Pulmonary Current Smoker    + decreased breath sounds      Cardiovascular hypertension, Pt. on medications  Rhythm:Regular Rate:Normal  Echo:   1. Mild intracavitary gradient. Peak velocity 0.88 m/s. Peak gradient 3.1  mmHg. Left ventricular ejection fraction, by estimation, is 60 to 65%. The  left ventricle has normal function. The left ventricle has no regional  wall motion abnormalities. There  is moderate concentric left ventricular hypertrophy. Left ventricular  diastolic parameters are consistent with Grade I diastolic dysfunction  (impaired relaxation).   2. Right ventricular systolic function is normal. The right ventricular  size is normal. There is normal pulmonary artery systolic pressure.   3. The mitral valve is normal in structure. Trivial mitral valve  regurgitation. No evidence of mitral stenosis.   4. The aortic valve is normal in structure. Aortic valve regurgitation is  not visualized. No aortic stenosis is present.   5. The inferior vena cava is normal in size with greater than 50%  respiratory variability, suggesting right atrial pressure of 3 mmHg.     Neuro/Psych  Headaches, Seizures -,  PSYCHIATRIC DISORDERS      CVA    GI/Hepatic negative GI ROS, Neg liver ROS,,,  Endo/Other  diabetes, Type 2, Insulin Dependent    Renal/GU Renal disease     Musculoskeletal negative musculoskeletal ROS (+)    Abdominal   Peds  Hematology  (+) Blood dyscrasia, anemia , HIV  Anesthesia Other Findings   Reproductive/Obstetrics                             Anesthesia Physical Anesthesia Plan  ASA: 4  Anesthesia Plan:  General   Post-op Pain Management:    Induction: Inhalational  PONV Risk Score and Plan: 1 and Ondansetron  Airway Management Planned: Tracheostomy  Additional Equipment: None  Intra-op Plan:   Post-operative Plan: Post-operative intubation/ventilation  Informed Consent: I have reviewed the patients History and Physical, chart, labs and discussed the procedure including the risks, benefits and alternatives for the proposed anesthesia with the patient or authorized representative who has indicated his/her understanding and acceptance.     History available from chart only and Consent reviewed with POA  Plan Discussed with: CRNA  Anesthesia Plan Comments:        Anesthesia Quick Evaluation

## 2023-10-12 ENCOUNTER — Encounter (HOSPITAL_COMMUNITY): Payer: Self-pay | Admitting: Otolaryngology

## 2023-10-12 DIAGNOSIS — I639 Cerebral infarction, unspecified: Secondary | ICD-10-CM | POA: Diagnosis not present

## 2023-10-12 DIAGNOSIS — J9601 Acute respiratory failure with hypoxia: Secondary | ICD-10-CM | POA: Diagnosis not present

## 2023-10-12 DIAGNOSIS — R1310 Dysphagia, unspecified: Secondary | ICD-10-CM | POA: Diagnosis not present

## 2023-10-12 LAB — CULTURE, BLOOD (ROUTINE X 2)
Culture: NO GROWTH
Culture: NO GROWTH

## 2023-10-12 LAB — PROCALCITONIN: Procalcitonin: 0.1 ng/mL

## 2023-10-12 LAB — GLUCOSE, CAPILLARY
Glucose-Capillary: 164 mg/dL — ABNORMAL HIGH (ref 70–99)
Glucose-Capillary: 195 mg/dL — ABNORMAL HIGH (ref 70–99)
Glucose-Capillary: 208 mg/dL — ABNORMAL HIGH (ref 70–99)
Glucose-Capillary: 221 mg/dL — ABNORMAL HIGH (ref 70–99)
Glucose-Capillary: 224 mg/dL — ABNORMAL HIGH (ref 70–99)
Glucose-Capillary: 234 mg/dL — ABNORMAL HIGH (ref 70–99)

## 2023-10-12 LAB — CBC
HCT: 29.9 % — ABNORMAL LOW (ref 39.0–52.0)
Hemoglobin: 9.6 g/dL — ABNORMAL LOW (ref 13.0–17.0)
MCH: 30.1 pg (ref 26.0–34.0)
MCHC: 32.1 g/dL (ref 30.0–36.0)
MCV: 93.7 fL (ref 80.0–100.0)
Platelets: 172 10*3/uL (ref 150–400)
RBC: 3.19 MIL/uL — ABNORMAL LOW (ref 4.22–5.81)
RDW: 15.1 % (ref 11.5–15.5)
WBC: 7 10*3/uL (ref 4.0–10.5)
nRBC: 0 % (ref 0.0–0.2)

## 2023-10-12 LAB — BASIC METABOLIC PANEL WITH GFR
Anion gap: 11 (ref 5–15)
BUN: 31 mg/dL — ABNORMAL HIGH (ref 6–20)
CO2: 30 mmol/L (ref 22–32)
Calcium: 9 mg/dL (ref 8.9–10.3)
Chloride: 98 mmol/L (ref 98–111)
Creatinine, Ser: 1.04 mg/dL (ref 0.61–1.24)
GFR, Estimated: >60 mL/min (ref >60)
Glucose, Bld: 251 mg/dL — ABNORMAL HIGH (ref 70–99)
Potassium: 4.7 mmol/L (ref 3.5–5.1)
Sodium: 139 mmol/L (ref 135–145)

## 2023-10-12 MED ORDER — SODIUM CHLORIDE 0.9 % IV SOLN
2.0000 g | Freq: Three times a day (TID) | INTRAVENOUS | Status: DC
  Administered 2023-10-13 – 2023-10-15 (×7): 2 g via INTRAVENOUS
  Filled 2023-10-12 (×7): qty 12.5

## 2023-10-12 MED ORDER — DEXTROSE 10 % IV SOLN: Freq: Every day | INTRAVENOUS | Status: AC

## 2023-10-12 MED ORDER — METRONIDAZOLE 500 MG/100ML IV SOLN
500.0000 mg | Freq: Two times a day (BID) | INTRAVENOUS | Status: DC
  Administered 2023-10-13 – 2023-10-15 (×5): 500 mg via INTRAVENOUS
  Filled 2023-10-12 (×5): qty 100

## 2023-10-12 MED ORDER — CLOPIDOGREL BISULFATE 75 MG PO TABS: 75.0000 mg | ORAL_TABLET | Freq: Every day | ORAL | Status: DC

## 2023-10-12 MED ORDER — ASPIRIN 81 MG PO CHEW
81.0000 mg | CHEWABLE_TABLET | Freq: Every day | ORAL | Status: DC
  Administered 2023-10-12 – 2023-11-08 (×27): 81 mg
  Filled 2023-10-12 (×27): qty 1

## 2023-10-12 MED ORDER — SODIUM CHLORIDE 0.9 % IV SOLN
INTRAVENOUS | Status: AC | PRN
  Administered 2023-10-12: 10 mL/h via INTRAVENOUS

## 2023-10-12 MED ORDER — FAMOTIDINE 20 MG PO TABS
20.0000 mg | ORAL_TABLET | Freq: Two times a day (BID) | ORAL | Status: DC
  Administered 2023-10-12 (×2): 20 mg
  Filled 2023-10-12: qty 1

## 2023-10-12 MED ORDER — PANTOPRAZOLE SODIUM 40 MG IV SOLR
40.0000 mg | Freq: Two times a day (BID) | INTRAVENOUS | Status: AC
Start: 2023-10-12 — End: 2023-10-30
  Administered 2023-10-12 – 2023-10-29 (×34): 40 mg via INTRAVENOUS
  Filled 2023-10-12 (×35): qty 10

## 2023-10-12 MED ORDER — ENOXAPARIN SODIUM 60 MG/0.6ML IJ SOSY
50.0000 mg | PREFILLED_SYRINGE | INTRAMUSCULAR | Status: DC
  Administered 2023-10-12 – 2023-11-10 (×29): 50 mg via SUBCUTANEOUS
  Filled 2023-10-12 (×30): qty 0.6

## 2023-10-12 MED ORDER — INSULIN ASPART 100 UNIT/ML IJ SOLN
12.0000 [IU] | INTRAMUSCULAR | Status: DC
  Administered 2023-10-12 – 2023-10-15 (×12): 12 [IU] via SUBCUTANEOUS

## 2023-10-12 MED ORDER — CLONAZEPAM 0.25 MG PO TBDP
0.5000 mg | ORAL_TABLET | Freq: Two times a day (BID) | ORAL | Status: DC
Start: 2023-10-12 — End: 2023-10-14
  Administered 2023-10-12 – 2023-10-14 (×5): 0.5 mg
  Filled 2023-10-12 (×5): qty 2

## 2023-10-12 MED ORDER — INSULIN GLARGINE-YFGN 100 UNIT/ML ~~LOC~~ SOLN
34.0000 [IU] | Freq: Two times a day (BID) | SUBCUTANEOUS | Status: DC
  Administered 2023-10-12 – 2023-10-23 (×20): 34 [IU] via SUBCUTANEOUS
  Filled 2023-10-12 (×26): qty 0.34

## 2023-10-12 MED ORDER — CLOPIDOGREL BISULFATE 75 MG PO TABS
75.0000 mg | ORAL_TABLET | Freq: Every day | ORAL | Status: DC
  Administered 2023-10-12 – 2023-10-15 (×4): 75 mg
  Filled 2023-10-12 (×4): qty 1

## 2023-10-12 MED ORDER — QUETIAPINE FUMARATE 50 MG PO TABS
50.0000 mg | ORAL_TABLET | Freq: Two times a day (BID) | ORAL | Status: DC
  Administered 2023-10-12 (×2): 50 mg
  Filled 2023-10-12 (×2): qty 1

## 2023-10-12 NOTE — Progress Notes (Incomplete)
 Pharmacy Antibiotic Note  Jeremiah Stevens is a 48 y.o. adult admitted since 09/26/2023 with trach placed on 4/1 now concerns for sepsis.  Pharmacy has been consulted for cefepime dosing.   -Concerns for drug induced fever w/ precedex >> switched to seroquel/klonopin  Plan: -Cefepime 2g IV every 8 hours  -Flagyl 500mg  IV every 12 hours -Monitor renal function -Follow up signs of clinical improvement, LOT, de-escalation of antibiotics   Height: 6' (182.9 cm) Weight: 116.1 kg (255 lb 15.3 oz) IBW/kg (Calculated) : 77.6  Temp (24hrs), Avg:101 F (38.3 C), Min:99.3 F (37.4 C), Max:103.1 F (39.5 C)  Recent Labs  Lab 10/07/23 0207 10/08/23 0229 10/09/23 0508 10/10/23 0228 10/11/23 1030 10/12/23 0632  WBC 5.7 10.5 4.9  --  10.4 7.0  CREATININE 1.59* 1.30* 1.07 1.03 1.14 1.04    Estimated Creatinine Clearance (by C-G formula based on SCr of 1.04 mg/dL) Male: 78.2 mL/min Male: 115.5 mL/min    Allergies  Allergen Reactions  . Penicillins Anaphylaxis  . Peanut-Containing Drug Products Other (See Comments)    WALNUTS - SORES ON TONGUE  . Sustiva [Efavirenz] Rash    Antimicrobials this admission: Atovaquone PJP px 3/30 >> Fluc from PTA meds Cefepime 4/1 >> Methylpred 40 BID 3/23> 3/28 40 daily> 20 > 10  Clinda 3/23 > 3/29 Primaquine 3/23 > 3/29 Flagyl 3/19>>3/23, 4/1 >> Rocephin 3/17 >>3/22 Azithromycin 3/17 x 1 Vanc/Cefepime 3/16 x 1  Microbiology results: *** BCx: *** *** UCx: ***  *** Sputum: ***  *** MRSA PCR: ***  Thank you for allowing pharmacy to be a part of this patient's care.  Fayrene Fearing [facility] 10/12/2023 11:34 PM

## 2023-10-12 NOTE — Progress Notes (Addendum)
 Pharmacy Antibiotic Note  Jeremiah Stevens is a 48 y.o. adult admitted since 09/26/2023 with trach placed on 4/1 now concerns for sepsis.  Pharmacy has been consulted for cefepime dosing (given PCN allergy).   -Concerns for drug induced fever w/ precedex >> switched to seroquel/klonopin with continued spiked fevers of 103.1 -WBC WNL -4/1 trach aspirate: GPC in pairs, blood cultures ngtd (new set collected 4/2)  Plan: -Cefepime 2g IV every 8 hours  -Flagyl 500mg  IV every 12 hours -Atovaquone 1500mg  per tube daily for PJP ppx -Fluconazole 400mg  per tube daily -Monitor renal function -Follow up signs of clinical improvement, LOT, de-escalation of antibiotics   Height: 6' (182.9 cm) Weight: 116.1 kg (255 lb 15.3 oz) IBW/kg (Calculated) : 77.6  Temp (24hrs), Avg:101 F (38.3 C), Min:99.3 F (37.4 C), Max:103.1 F (39.5 C)  Recent Labs  Lab 10/07/23 0207 10/08/23 0229 10/09/23 0508 10/10/23 0228 10/11/23 1030 10/12/23 0632  WBC 5.7 10.5 4.9  --  10.4 7.0  CREATININE 1.59* 1.30* 1.07 1.03 1.14 1.04    Estimated Creatinine Clearance (by C-G formula based on SCr of 1.04 mg/dL) Male: 04.5 mL/min Male: 115.5 mL/min    Allergies  Allergen Reactions   Penicillins Anaphylaxis   Peanut-Containing Drug Products Other (See Comments)    WALNUTS - SORES ON TONGUE   Sustiva [Efavirenz] Rash    Antimicrobials this admission: Atovaquone PJP px 3/30 >> Fluc from PTA meds Cefepime 4/1 >> Methylpred 40 BID 3/23> 3/28 40 daily> 20 > 10  Clinda 3/23 > 3/29 Primaquine 3/23 > 3/29 Flagyl 3/19>>3/23, 4/1 >> Rocephin 3/17 >>3/22 Azithromycin 3/17 x 1 Vanc/Cefepime 3/16 x 1  Microbiology results: 3/27 BCx: NGTD 4/1 Trach aspirate: GPC in pairs 4/2 Bcx: ordered  Thank you for allowing pharmacy to be a part of this patient's care.  Arabella Merles, PharmD. Clinical Pharmacist 10/13/2023 12:04 AM

## 2023-10-12 NOTE — Plan of Care (Signed)
  Problem: Education: Goal: Knowledge of disease or condition will improve Outcome: Progressing Goal: Knowledge of secondary prevention will improve (MUST DOCUMENT ALL) Outcome: Progressing   Problem: Ischemic Stroke/TIA Tissue Perfusion: Goal: Complications of ischemic stroke/TIA will be minimized Outcome: Progressing   Problem: Coping: Goal: Will verbalize positive feelings about self Outcome: Progressing Goal: Will identify appropriate support needs Outcome: Progressing

## 2023-10-12 NOTE — TOC Progression Note (Addendum)
 Transition of Care [facility]) - Progression Note    Patient Details  Name: Jeremiah Stevens MRN: [id number] Date of Birth: 1976/04/19  Transition of Care [facility]) CM/SW Contact  Lawerance Sabal, RN Phone Number: 10/12/2023, 10:02 AM  Clinical Narrative:     Received notification from [facility] that they have reviewed insurance and  would be able to potentially offer a bed. Spoke w patient at bedside, he is agreeable and would me to speak with his sister. I called his sister, [name], and we discussed LTACH and she is agreeable to speak with [facility] liaison, who has been notified to reach out to her.   Notified by [facility] that sister has chosen to proceed with [facility], [facility] will obtain insurance auth.   JAYMON, DUDEK (Sister) [phone]    Expected Discharge Plan: IP Rehab Facility Barriers to Discharge: Continued Medical Work up  Expected Discharge Plan and Services In-house Referral: Clinical Social Work   Post Acute Care Choice: NA Living arrangements for the past 2 months: Single Family Home                 DME Arranged: N/A DME Agency: NA                   Social Determinants of Health (SDOH) Interventions SDOH Screenings   Food Insecurity: No Food Insecurity (09/27/2023)  Housing: Low Risk  (09/27/2023)  Transportation Needs: Unmet Transportation Needs (09/27/2023)  Utilities: Not At Risk (09/27/2023)  Depression (PHQ2-9): Low Risk  (05/22/2020)  Tobacco Use: High Risk (10/11/2023)    Readmission Risk Interventions     No data to display

## 2023-10-12 NOTE — Progress Notes (Addendum)
 eLink Physician-Brief Progress Note Patient Name: Jeremiah Stevens DOB: January 11, 1976 MRN: [id number]   Date of Service  10/12/2023  HPI/Events of Note  Patient had a tracheostomy placed today.  Since that time, the patient has disconnected herself from the ventilator once, now has mittens in place.  Notified that the patient also had a bout of hematemesis-unwitnessed.  It appears to have come from her mouth and was expectorated over the tracheostomy/dressing.  Has a core track tube in place without evidence of melena, significant drop in hemoglobin, or epistaxis.  eICU Interventions  For now, we will switch prophylactic famotidine to therapeutic PPI twice daily.  Likely, this is a result of her recent tracheostomy and will continue to monitor.  If she has repeated events, we will place an NG to aspirate stomach contents and discussed the case with GI.  Will reassess a.m. hemoglobin.   Switch earlier from Precedex to Klonopin/Seroquel with concern for drug-induced fever.  Spiked another fever to 103.1.  Previously on empiric treatment for PJP with negative pneumocystis PCR.  Procalcitonin negative.  Blood cultures last collected 3/27.  Respiratory cultures with rods in pairs.  Will start Zosyn for now per primary team note.  Repeat blood cultures.  Trend procalcitonin.  Intervention Category Intermediate Interventions: Bleeding - evaluation and treatment with blood products  Xabi Wittler 10/12/2023, 10:07 PM

## 2023-10-12 NOTE — Progress Notes (Signed)
 Physical Therapy Treatment Patient Details Name: Jeremiah Stevens MRN: [id number] DOB: 1976/01/02 Today's Date: 10/12/2023   History of Present Illness Pt initially admitted to [facility] on for difficulty swallowing. Rapid response on 3/19 with PEA, ROSC after 2 min, then bradycardic arrest 1 min later, CPR resumed, pt intubated, and ROSC 6 min later. Seizure-like activity also noted briefly after ROSC. Pt transferred to [facility] 3/20. 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 Tracheostomy on 3/31. PMH includes: HIV/AIDS, HTN, herpes, migraine, depression, DM II, tobacco use, and seizure.    PT Comments  Pt tolerates treatment well s/p tracheostomy on 3/31. Pt continues to require significant physical assistance for functional mobility tasks but demonstrates periods of little assistance requirements to maintain sitting. Pt will benefit from continued frequent mobilization in an effort to improve strength and balance. PT updates recommendation to PT services at [facility] as this appears to be the medical plan after tracheostomy.Arlyss Gandy, PT, DPT Acute Rehabilitation Office [phone]    If plan is discharge home, recommend the following: Assistance with cooking/housework;Direct supervision/assist for medications management;Direct supervision/assist for financial management;Assist for transportation;Help with stairs or ramp for entrance;Supervision due to cognitive status;Two people to help with bathing/dressing/bathroom;Two people to help with walking and/or transfers   Can travel by private Theme park manager cushion (measurements PT);Wheelchair (measurements PT);Hoyer lift;Hospital bed    Recommendations for Other Services       Precautions / Restrictions Precautions Precautions: Fall;Other (comment) Recall of Precautions/Restrictions: Impaired Precaution/Restrictions Comments: trach to vent, cortrak, primofit Restrictions Weight  Bearing Restrictions Per Provider Order: No     Mobility  Bed Mobility Overal bed mobility: Needs Assistance Bed Mobility: Rolling, Supine to Sit, Sit to Supine Rolling: Max assist   Supine to sit: Max assist, +2 for physical assistance, HOB elevated Sit to supine: Max assist, +2 for physical assistance        Transfers Overall transfer level:  (deferred on this date due to recent tracheostomy and weaning period)                      Ambulation/Gait                   Stairs             Wheelchair Mobility     Tilt Bed    Modified Rankin (Stroke Patients Only)       Balance Overall balance assessment: Needs assistance Sitting-balance support: Single extremity supported, Bilateral upper extremity supported, Feet supported Sitting balance-Leahy Scale: Poor Sitting balance - Comments: modA for majority of sitting progresses to intermittent minA with verbal cueing to correct R lateral lean Postural control: Right lateral lean                                  Communication Communication Communication: Impaired Factors Affecting Communication: Trach/intubated  Cognition Arousal: Alert Behavior During Therapy: WFL for tasks assessed/performed   PT - Cognitive impairments: Difficult to assess Difficult to assess due to: Tracheostomy                     PT - Cognition Comments: pt follows commands with increased time, appears to respond appropriately to yes/no questions Following commands: Impaired Following commands impaired: Follows one step commands with increased time    Cueing Cueing Techniques: Verbal cues, Tactile  cues  Exercises      General Comments General comments (skin integrity, edema, etc.): pt on trach to vent, 30% FiO2, 8 PEEP, PSRV. Pt with intermittent coughing and mucous production orally initially after sitting, noted to have bright red coloring. This stops for majority of time seated. Pt then with  further bright red mucous production after returning to supine, RN made aware. HR jumps to 130s with periods of coughing but is otherwise 90-110 for rest of session. BP 125/76 in sitting.      Pertinent Vitals/Pain Pain Assessment Pain Assessment: CPOT Facial Expression: Grimacing (intermittent with mobility or hygiene tasks) Body Movements: Absence of movements Muscle Tension: Relaxed Compliance with ventilator (intubated pts.): Coughing but tolerating Vocalization (extubated pts.): N/A CPOT Total: 3 Pain Location: buttocks Pain Descriptors / Indicators: Grimacing Pain Intervention(s): Monitored during session    Home Living                          Prior Function            PT Goals (current goals can now be found in the care plan section) Acute Rehab PT Goals Patient Stated Goal: to return home Progress towards PT goals: Progressing toward goals    Frequency    Min 2X/week      PT Plan      Co-evaluation PT/OT/SLP Co-Evaluation/Treatment: Yes Reason for Co-Treatment: Complexity of the patient's impairments (multi-system involvement);Necessary to address cognition/behavior during functional activity;For patient/therapist safety PT goals addressed during session: Mobility/safety with mobility;Balance;Strengthening/ROM        AM-PAC PT "6 Clicks" Mobility   Outcome Measure  Help needed turning from your back to your side while in a flat bed without using bedrails?: A Lot Help needed moving from lying on your back to sitting on the side of a flat bed without using bedrails?: Total Help needed moving to and from a bed to a chair (including a wheelchair)?: Total Help needed standing up from a chair using your arms (e.g., wheelchair or bedside chair)?: Total Help needed to walk in hospital room?: Total Help needed climbing 3-5 steps with a railing? : Total 6 Click Score: 7    End of Session Equipment Utilized During Treatment: Oxygen Activity  Tolerance: Patient tolerated treatment well Patient left: in bed;with call bell/phone within reach;with bed alarm set;with nursing/sitter in room Nurse Communication: Mobility status;Need for lift equipment PT Visit Diagnosis: Unsteadiness on feet (R26.81);Hemiplegia and hemiparesis;Muscle weakness (generalized) (M62.81);Other abnormalities of gait and mobility (R26.89);Difficulty in walking, not elsewhere classified (R26.2);Other symptoms and signs involving the nervous system (R29.898) Hemiplegia - Right/Left: Right Hemiplegia - dominant/non-dominant: Non-dominant Hemiplegia - caused by: Cerebral infarction     Time: 1610-9604 PT Time Calculation (min) (ACUTE ONLY): 40 min  Charges:    $Therapeutic Activity: 8-22 mins PT General Charges $$ ACUTE PT VISIT: 1 Visit                     Arlyss Gandy, PT, DPT Acute Rehabilitation Office [phone]    Arlyss Gandy 10/12/2023, 2:39 PM

## 2023-10-12 NOTE — Progress Notes (Addendum)
 NAME:  Jeremiah Stevens, MRN:  [id number], DOB:  09-04-1975, LOS: 16 ADMISSION DATE:  09/26/2023, CONSULTATION DATE:  09/29/23 REFERRING MD:  Rennis Chris CHIEF COMPLAINT:  Hypoxia   History of Present Illness:  Pt is encephelopathic; therefore, this HPI is obtained from chart review. Jeremiah Stevens is a 48 y.o. adult who has a PMH as below including but not limited to DM2, seizures, HIV AIDS untreated, cryptococcal disease, obesity, polysubstance abuse, prior CVA. She was admitted 09/26/23 with dysphagia x 3 days along with headaches and drooling. He was found to be hypoxic to 80s on room air.  CTA was negative for PE but showed multifocal PNA. He was started on abx and BD's. Biktarvy was continued and ID was consulted.   3/18 He had right facial droop, right sided weakness, dysarthria. Neurology was consulted and recommended CTA and MRI. MRI demonstrated acute right medullary infarct.  Despite ongoing dysphagia (MBS showed moderately severe pharyngeal phase dysphagia and imaging showed unilateral bulging on right), pt insisted on eating. He reportedly ordered pizza overnight 3/18 and certainly aspirated. He had threatened to leave AMA despite stroke workup ongoing.  3/19, he had hypoxia for which rapid response was called. While being assess, he continued to desaturate down into the 60s and then 40s. He required BVM and was transferred to the ICU for further evaluation and management. Upon arrival in the ICU, she had ongoing desaturations and bradycardia before developing PEA arrest. He had 9 minutes ACLS prior to ROSC including epi x 4 and bicarb x 2. During intubation attempt, she had copious food material and emesis in her oropharynx. Visualization of vocal cords was difficult due to amount of food material and emesis despite suctioning. On 2nd attempt, ETT was passed successfully and saturations improved shortly thereafter. There was grand mal seizure activity noted after intubation that  abated after Midazolam administration.  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[facility]); Non-suicidal depressed mood; Gender dysphoria; RLL pneumonia; AKI (acute kidney injury) (HCC); Uncontrolled type 2 diabetes mellitus with hyperglycemia, without long-term current use of insulin (HCC); Tobacco abuse; HIV infection (HCC); Positive RPR test; Hyperosmolar hyperglycemic state (HHS) (HCC); Seizure (HCC); Acute hypoxic respiratory failure (HCC); Acute encephalopathy; Hyperammonemia (HCC); Pancytopenia (HCC); Cryptococcosis (HCC); CAP (community acquired pneumonia); Globus sensation; Aspiration pneumonia of both lungs due to vomit (HCC); Cardiac arrest, cause unspecified (HCC); Cerebrovascular accident (CVA) (HCC); Seizures (HCC); 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  Significant Hospital Events: Including procedures, antibiotic start and stop dates in addition to other pertinent events   3/16 admit 3/18 neuro consult, found to have acute CVA 3/19 Ongoing aspiration despite obvious dysphagia, was planning to leave AMA but sister talked pt into staying. then had a massive aspiration event leading to worse hypoxia and PEA arrest. Txf to [facility] 3/20 PSV cumulative total of 5-6 hrs, following some commands/ purposefully 3/21 self extubated, was reintubated 3/23 Failed SBT due to WOB 3/24 Failed SBT due to apnea 3/25 Tolerated PS >4 hours 3/26: Intubated. 3/31: S/p tracheostomy with ENT.  Interim History / Subjective:  He had one episode of low grade fever, VS. She says breathing is better with trach tube, still on the ventilator.  Patient has been accepted to [facility] , waiting on bed availability. Objective:  Blood pressure 139/69, pulse 82, temperature (!) 100.6 F (38.1 C),  temperature source  Oral, resp. rate 16, height 6' (1.829 m), weight 116.1 kg, SpO2 99%.    Vent Mode: PRVC FiO2 (%):  [30 %] 30 % Set Rate:  [12 bmp] 12 bmp Vt Set:  [470 mL] 470 mL PEEP:  [8 cmH20] 8 cmH20 Plateau Pressure:  [18 cmH20-20 cmH20] 18 cmH20   Intake/Output Summary (Last 24 hours) at 10/12/2023 0733 Last data filed at 10/12/2023 0700 Gross per 24 hour  Intake 1918.18 ml  Output 950 ml  Net 968.18 ml   Filed Weights   10/09/23 0434 10/11/23 0705 10/12/23 0500  Weight: 104.9 kg 116.5 kg 116.1 kg   Physical Exam: General: Ill-appearing obese man, ventilated, comfortable on Precedex HENT: Trach tube in place, stoma site without redness Respiratory: Cardiovascular: Regular, no murmur GI: Obese, protuberant, positive bowel sounds Extremities: No edema Neuro:  GU: External foley in place  Labs/imaging personally reviewed:  WBC 7.0 Hb 11.0.  >>9.6. Fasting BG 221, postprandial glucose 200-275  Assessment & Plan:  Acute hypoxic respiratory failure Multifocal PNA, CAP  Possible pneumocystis pneumonia Aspiration event Dysphagia  Tobacco use S/p  tracheostomy with ENT.  We have discontinued Precedex due to drug fever. Tolerating PSV 15/8, will attempt weaning to CPAP, if she can tolerate.  She has been accepted to [facility] waiting on bed offers.  - Wean vent to trach collar tomorrow - Start Klonopin and Seroquel - PJP PCR was negative, on prophylactic atovaquone and rapid steroid taper.    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 Continue statin  DM2, with hyperglycemia, on steroids Both fasting and postprandial hyperglycemia.  - Increase glargine to 34 units BID. - Increase tube feeds coverage to 12units q4hrs.  Normocytic anemia -Follow intermittent CBC  HIV / AIDS Hx +cryptococcal antigen  VL 3410 CD4 36 P -Appreciate ID input -Continue to follow for evidence IRIS as he is not compliant with his HAART outpatient -Continue Descovy, Tivicay as ordered -Continue chronic fluconazole -PJP prophylaxis initiated 3/29 (PCR negative)  Hyperthyroidism r/o  -TSH low T3 normal, T4 normal   THC use  -Cessation counseling when able  Code status - iPAL note 3/19 - DNR status - 3/21 patient self extubated and family reversed to full code - 3/25. Patient and sister agreed to plan for extubation and re-intubation. Would want trach if indicated in the future  Best practice (evaluated daily):  Diet/type: Tube feeds.  DVT prophylaxis: Heparin  Pressure ulcer(s): pressure ulcer assessment deferred  GI prophylaxis: PPI Lines: N/A Foley:  Yes, and it is still needed Code Status:  full code Last date of multidisciplinary goals of care discussion: 3/25. Patient and sister agreed to plan for extubation and re-intubation. Would want trach  Laretta Bolster, M.D.  Internal Medicine Resident, PGY-1 [facility]  Pager: [phone] 7:33 AM, 10/12/2023

## 2023-10-12 NOTE — TOC Progression Note (Signed)
 Transition of Care [facility]) - Progression Note    Patient Details  Name: Jeremiah Stevens MRN: [id number] Date of Birth: Sep 21, 1975  Transition of Care [facility]) CM/SW Contact  Marliss Coots, LCSW Phone Number: 10/12/2023, 11:16 AM  Clinical Narrative:     11:16 AM CSW introduced self and role to patient at bedside. CSW followed up on SDOH needs (transportation). Patient confirmed need. CSW provided printed transportation resources. CSW added these resources to AVS as well.  Expected Discharge Plan: Long Term Acute Care (LTAC) Barriers to Discharge: Continued Medical Work up  Expected Discharge Plan and Services In-house Referral: Clinical Social Work   Post Acute Care Choice: NA Living arrangements for the past 2 months: Single Family Home                 DME Arranged: N/A DME Agency: NA                   Social Determinants of Health (SDOH) Interventions SDOH Screenings   Food Insecurity: No Food Insecurity (09/27/2023)  Housing: Low Risk  (09/27/2023)  Transportation Needs: Unmet Transportation Needs (09/27/2023)  Utilities: Not At Risk (09/27/2023)  Depression (PHQ2-9): Low Risk  (05/22/2020)  Tobacco Use: High Risk (10/11/2023)    Readmission Risk Interventions     No data to display

## 2023-10-12 NOTE — Inpatient Diabetes Management (Signed)
 Inpatient Diabetes Program Recommendations  AACE/ADA: New Consensus Statement on Inpatient Glycemic Control (2015)  Target Ranges:  Prepandial:   less than 140 mg/dL      Peak postprandial:   less than 180 mg/dL (1-2 hours)      Critically ill patients:  140 - 180 mg/dL   Lab Results  Component Value Date   GLUCAP 221 (H) 10/12/2023   HGBA1C 10.8 (H) 09/29/2023    Latest Reference Range & Units 10/11/23 07:26 10/11/23 12:34 10/11/23 14:39 10/11/23 16:28 10/11/23 19:29 10/11/23 23:10 10/12/23 03:27 10/12/23 07:15  Glucose-Capillary 70 - 99 mg/dL 409 (H) 47 (L) 811 (H) 200 (H) 275 (H) 237 (H) 224 (H) 221 (H)  (H): Data is abnormally high (L): Data is abnormally low  Inpatient Diabetes Program Recommendations:   Noted Semglee Increased to 34 units bid. Please consider: -Increase Novolog tube feed coverage to 12 units q 4 hrs. If CBGs > 180.  Thank you, Billy Fischer. Dolce Sylvia, RN, MSN, CDCES  Diabetes Coordinator Inpatient Glycemic Control Team Team Pager [phone] (8am-5pm) 10/12/2023 9:42 AM

## 2023-10-12 NOTE — Progress Notes (Addendum)
 Occupational Therapy Treatment Patient Details Name: Jeremiah Stevens MRN: [id number] DOB: 03-24-1976 Today's Date: 10/12/2023   History of present illness Pt initially admitted to [facility] on for difficulty swallowing. Rapid response on 3/19 with PEA, ROSC after 2 min, then bradycardic arrest 1 min later, CPR resumed, pt intubated, and ROSC 6 min later. Seizure-like activity also noted briefly after ROSC. Pt transferred to [facility] 3/20. 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 Tracheostomy on 3/31. PMH includes: HIV/AIDS, HTN, herpes, migraine, depression, DM II, tobacco use, and seizure.   OT comments  Pt progressing toward goals, now seen s/p trach placement. Pt needing max A +2 for bed mobility, sits EOB x10 min for UE therex and suctioning. Pt coughing up bloody mucous/secretions during session, RN notified and present in room. Pt incontinent of BM needing max A for rolling at bed level for pericare. Pt presenting with impairments listed below, will follow acutely. Updating d/c recommendation to OT at [facility], per case mgmt note this is the venue being pursued s/p trach placement.  HR up to 110-130s during session 30% FiO2 8 PEEP trach-vent      If plan is discharge home, recommend the following:  Two people to help with walking and/or transfers;Two people to help with bathing/dressing/bathroom   Equipment Recommendations  Wheelchair (measurements OT);Wheelchair cushion (measurements OT);BSC/3in1;Hospital bed    Recommendations for Other Services Speech consult    Precautions / Restrictions Precautions Precautions: Fall;Other (comment) Recall of Precautions/Restrictions: Impaired Precaution/Restrictions Comments: trach to vent, cortrak, primofit Restrictions Weight Bearing Restrictions Per Provider Order: No       Mobility Bed Mobility Overal bed mobility: Needs Assistance Bed Mobility: Rolling, Supine to Sit, Sit to Supine Rolling: Max assist    Supine to sit: Max assist, +2 for physical assistance, HOB elevated Sit to supine: Max assist, +2 for physical assistance        Transfers                   General transfer comment: deferred     Balance Overall balance assessment: Needs assistance Sitting-balance support: Single extremity supported, Bilateral upper extremity supported, Feet supported Sitting balance-Leahy Scale: Poor Sitting balance - Comments: modA for majority of sitting progresses to intermittent minA with verbal cueing to correct R lateral lean Postural control: Right lateral lean                                 ADL either performed or assessed with clinical judgement   ADL Overall ADL's : Needs assistance/impaired Eating/Feeding: NPO                                   Functional mobility during ADLs: Maximal assistance;+2 for physical assistance      Extremity/Trunk Assessment Upper Extremity Assessment RUE Deficits / Details: 2+/5 shoulder ROM, able to bring hand to mouth with elbow and wrist supported RUE Coordination: decreased gross motor;decreased fine motor   Lower Extremity Assessment Lower Extremity Assessment: Defer to PT evaluation        Vision   Vision Assessment?: No apparent visual deficits   Perception Perception Perception: Within Functional Limits   Praxis Praxis Praxis: WFL   Communication Communication Communication: Impaired Factors Affecting Communication: Trach/intubated   Cognition Arousal: Alert Behavior During Therapy: [facility] for tasks assessed/performed  OT - Cognition Comments: nodding yes/no consistenly throughout session                 Following commands: Impaired Following commands impaired: Follows one step commands with increased time      Cueing   Cueing Techniques: Verbal cues, Tactile cues  Exercises General Exercises - Upper Extremity Shoulder Flexion: Right, PROM, Seated, 10 reps,  Left Other Exercises Other Exercises: AAROM of RUE elbow flex/ext    Shoulder Instructions       General Comments 30% FiO2, 8 PEEP, needing suction x2-3 during session and orally coughing up bloody mucous. RN notified. HR increased to 130s with episodes of coughing/suctioniong    Pertinent Vitals/ Pain       Pain Assessment Pain Assessment: Faces Pain Score: 6  Faces Pain Scale: Hurts even more Pain Location: peri area with pericare Pain Descriptors / Indicators: Grimacing Pain Intervention(s): Limited activity within patient's tolerance, Monitored during session, Repositioned  Home Living                                          Prior Functioning/Environment              Frequency  Min 2X/week        Progress Toward Goals  OT Goals(current goals can now be found in the care plan section)  Progress towards OT goals: Progressing toward goals  Acute Rehab OT Goals Patient Stated Goal: did not state OT Goal Formulation: With patient Time For Goal Achievement: 10/18/23 Potential to Achieve Goals: Good ADL Goals Pt Will Perform Lower Body Dressing: with mod assist;sit to/from stand Pt Will Transfer to Toilet: with +2 assist;with mod assist;stand pivot transfer;bedside commode Additional ADL Goal #1: pt will complete HEP R UE min (A) written  Plan      Co-evaluation    PT/OT/SLP Co-Evaluation/Treatment: Yes Reason for Co-Treatment: Complexity of the patient's impairments (multi-system involvement);Necessary to address cognition/behavior during functional activity;For patient/therapist safety PT goals addressed during session: Mobility/safety with mobility;Balance;Strengthening/ROM OT goals addressed during session: ADL's and self-care;Proper use of Adaptive equipment and DME;Strengthening/ROM      AM-PAC OT "6 Clicks" Daily Activity     Outcome Measure   Help from another person eating meals?: A Lot Help from another person taking care of  personal grooming?: A Lot Help from another person toileting, which includes using toliet, bedpan, or urinal?: Total Help from another person bathing (including washing, rinsing, drying)?: A Lot Help from another person to put on and taking off regular upper body clothing?: A Lot Help from another person to put on and taking off regular lower body clothing?: Total 6 Click Score: 10    End of Session Equipment Utilized During Treatment: Oxygen (trach/vent)  OT Visit Diagnosis: Unsteadiness on feet (R26.81);Muscle weakness (generalized) (M62.81) Symptoms and signs involving cognitive functions: Cerebral infarction   Activity Tolerance Patient tolerated treatment well   Patient Left in bed;with call bell/phone within reach;with bed alarm set;with nursing/sitter in room   Nurse Communication Need for lift equipment;Precautions;Mobility status        Time: 9147-8295 OT Time Calculation (min): 40 min  Charges: OT General Charges $OT Visit: 1 Visit OT Treatments $Self Care/Home Management : 8-22 mins $Therapeutic Activity: 8-22 mins  Carver Fila, OTD, OTR/L SecureChat Preferred Acute Rehab [phone]   Carver Fila Koonce 10/12/2023, 5:10 PM

## 2023-10-13 ENCOUNTER — Inpatient Hospital Stay (HOSPITAL_COMMUNITY)

## 2023-10-13 DIAGNOSIS — R1310 Dysphagia, unspecified: Secondary | ICD-10-CM | POA: Diagnosis not present

## 2023-10-13 DIAGNOSIS — I639 Cerebral infarction, unspecified: Secondary | ICD-10-CM | POA: Diagnosis not present

## 2023-10-13 DIAGNOSIS — J9601 Acute respiratory failure with hypoxia: Secondary | ICD-10-CM | POA: Diagnosis not present

## 2023-10-13 LAB — CBC
HCT: 29.9 % — ABNORMAL LOW (ref 39.0–52.0)
Hemoglobin: 9.4 g/dL — ABNORMAL LOW (ref 13.0–17.0)
MCH: 29.8 pg (ref 26.0–34.0)
MCHC: 31.4 g/dL (ref 30.0–36.0)
MCV: 94.9 fL (ref 80.0–100.0)
Platelets: 224 10*3/uL (ref 150–400)
RBC: 3.15 MIL/uL — ABNORMAL LOW (ref 4.22–5.81)
RDW: 15.4 % (ref 11.5–15.5)
WBC: 9.6 10*3/uL (ref 4.0–10.5)
nRBC: 0 % (ref 0.0–0.2)

## 2023-10-13 LAB — GLUCOSE, CAPILLARY
Glucose-Capillary: 210 mg/dL — ABNORMAL HIGH (ref 70–99)
Glucose-Capillary: 135 mg/dL — ABNORMAL HIGH (ref 70–99)
Glucose-Capillary: 112 mg/dL — ABNORMAL HIGH (ref 70–99)
Glucose-Capillary: 206 mg/dL — ABNORMAL HIGH (ref 70–99)
Glucose-Capillary: 177 mg/dL — ABNORMAL HIGH (ref 70–99)

## 2023-10-13 LAB — PROTIME-INR
INR: 1.1 (ref 0.8–1.2)
Prothrombin Time: 14.4 s (ref 11.4–15.2)

## 2023-10-13 LAB — BASIC METABOLIC PANEL WITH GFR
Anion gap: 8 (ref 5–15)
BUN: 28 mg/dL — ABNORMAL HIGH (ref 6–20)
CO2: 32 mmol/L (ref 22–32)
Calcium: 8.7 mg/dL — ABNORMAL LOW (ref 8.9–10.3)
Chloride: 98 mmol/L (ref 98–111)
Creatinine, Ser: 1.07 mg/dL (ref 0.61–1.24)
GFR, Estimated: >60 mL/min (ref >60)
Glucose, Bld: 217 mg/dL — ABNORMAL HIGH (ref 70–99)
Potassium: 4.8 mmol/L (ref 3.5–5.1)
Sodium: 138 mmol/L (ref 135–145)

## 2023-10-13 LAB — PROCALCITONIN: Procalcitonin: 0.17 ng/mL

## 2023-10-13 LAB — MAGNESIUM: Magnesium: 2.2 mg/dL (ref 1.7–2.4)

## 2023-10-13 MED ORDER — LOPERAMIDE HCL 1 MG/7.5ML PO SUSP
2.0000 mg | Freq: Three times a day (TID) | ORAL | Status: DC | PRN
  Administered 2023-10-13: 2 mg via ORAL
  Filled 2023-10-13 (×2): qty 15

## 2023-10-13 MED ORDER — ATORVASTATIN CALCIUM 10 MG PO TABS
20.0000 mg | ORAL_TABLET | Freq: Every day | ORAL | Status: DC
  Administered 2023-10-13 – 2023-11-08 (×26): 20 mg
  Filled 2023-10-13 (×27): qty 2

## 2023-10-13 MED ORDER — ALUM & MAG HYDROXIDE-SIMETH 200-200-20 MG/5ML PO SUSP: 30.0000 mL | ORAL | Status: DC | PRN

## 2023-10-13 MED ORDER — QUETIAPINE FUMARATE 50 MG PO TABS
50.0000 mg | ORAL_TABLET | Freq: Every day | ORAL | Status: DC
  Administered 2023-10-13 – 2023-10-17 (×5): 50 mg
  Filled 2023-10-13 (×5): qty 1

## 2023-10-13 MED ORDER — QUETIAPINE FUMARATE 100 MG PO TABS
100.0000 mg | ORAL_TABLET | Freq: Every day | ORAL | Status: DC
  Administered 2023-10-13 – 2023-10-16 (×4): 100 mg
  Filled 2023-10-13 (×4): qty 1

## 2023-10-13 MED ORDER — HALOPERIDOL LACTATE 5 MG/ML IJ SOLN
5.0000 mg | Freq: Once | INTRAMUSCULAR | Status: AC
Start: 2023-10-13 — End: 2023-10-13
  Administered 2023-10-13: 5 mg via INTRAVENOUS
  Filled 2023-10-13: qty 1

## 2023-10-13 MED ORDER — DEXTROSE 10 % IV SOLN: Freq: Every day | INTRAVENOUS | Status: DC

## 2023-10-13 MED ORDER — BANATROL TF EN LIQD
60.0000 mL | Freq: Two times a day (BID) | ENTERAL | Status: DC
  Administered 2023-10-13 – 2023-11-09 (×49): 60 mL
  Filled 2023-10-13 (×55): qty 60

## 2023-10-13 MED ORDER — FENTANYL CITRATE PF 50 MCG/ML IJ SOSY
50.0000 ug | PREFILLED_SYRINGE | INTRAMUSCULAR | Status: DC | PRN
  Administered 2023-10-14 – 2023-10-16 (×3): 50 ug via INTRAVENOUS
  Filled 2023-10-13 (×3): qty 1

## 2023-10-13 MED ORDER — POLYETHYLENE GLYCOL 3350 17 G PO PACK: 17.0000 g | PACK | Freq: Every day | ORAL | Status: DC | PRN

## 2023-10-13 MED ORDER — GERHARDT'S BUTT CREAM
TOPICAL_CREAM | Freq: Two times a day (BID) | CUTANEOUS | Status: DC
  Administered 2023-10-23 – 2023-11-09 (×7): 1 via TOPICAL
  Filled 2023-10-13 (×3): qty 60

## 2023-10-13 MED ORDER — EMTRICITABINE-TENOFOVIR DF 200-300 MG PO TABS
1.0000 | ORAL_TABLET | Freq: Every day | ORAL | Status: DC
Start: 2023-10-14 — End: 2023-10-15
  Administered 2023-10-14: 1 via ORAL
  Filled 2023-10-13 (×2): qty 1

## 2023-10-13 NOTE — Progress Notes (Signed)
 NAME:  Jeremiah Stevens, MRN:  [id number], DOB:  Nov 11, 1975, LOS: 17 ADMISSION DATE:  09/26/2023, CONSULTATION DATE:  09/29/23 REFERRING MD:  Rennis Chris CHIEF COMPLAINT:  Hypoxia   History of Present Illness:  Pt is encephelopathic; therefore, this HPI is obtained from chart review. Jeremiah Stevens is a 48 y.o. adult who has a PMH as below including but not limited to DM2, seizures, HIV AIDS untreated, cryptococcal disease, obesity, polysubstance abuse, prior CVA. She was admitted 09/26/23 with dysphagia x 3 days along with headaches and drooling. He was found to be hypoxic to 80s on room air. CTA was negative for PE but showed multifocal PNA. He was started on abx and BD's. Biktarvy was continued and ID was consulted. 3/18 He had right facial droop, right sided weakness, dysarthria. Neurology was consulted and recommended CTA and MRI. MRI demonstrated acute right medullary infarct. Despite ongoing dysphagia (MBS showed moderately severe pharyngeal phase dysphagia and imaging showed unilateral bulging on right), pt insisted on eating. He reportedly ordered pizza overnight 3/18 and certainly aspirated. He had threatened to leave AMA despite stroke workup ongoing. 3/19, he had hypoxia for which rapid response was called. While being assess, he continued to desaturate down into the 60s and then 40s. He required BVM and was transferred to the ICU for further evaluation and management. Upon arrival in the ICU, she had ongoing desaturations and bradycardia before developing PEA arrest. He had 9 minutes ACLS prior to ROSC including epi x 4 and bicarb x 2. During intubation attempt, she had copious food material and emesis in her oropharynx. Visualization of vocal cords was difficult due to amount of food material and emesis despite suctioning. On 2nd attempt, ETT was passed successfully and saturations improved shortly thereafter. There was grand mal seizure activity noted after intubation that abated after  Midazolam administration.  Pertinent  Medical History:  HIV/AIDS, DM II, seizures, polysubstance use, CVA, cryptococcal disease.   Significant Hospital Events: Including procedures, antibiotic start and stop dates in addition to other pertinent events   3/16 admit 3/18 neuro consult, found to have acute CVA 3/19 Ongoing aspiration despite obvious dysphagia, was planning to leave AMA but sister talked pt into staying. then had a massive aspiration event leading to worse hypoxia and PEA arrest. Txf to [facility] 3/20 PSV cumulative total of 5-6 hrs, following some commands/ purposefully 3/21 self extubated, was reintubated 3/23 Failed SBT due to WOB 3/24 Failed SBT due to apnea 3/25 Tolerated PS >4 hours 3/26: Intubated. 3/31: S/p tracheostomy with ENT.  Interim History / Subjective:  Fever to 103.5, tracheal aspirate and blood cultures sent.  Covered empirically with cefepime and Flagyl.  Got 1 dose of Haldol for agitation.  This morning, he continues to fever.   Currently working on dispo to [employer], spoke to Child psychotherapist, [facility] unable to accept patient as his current HIV/AIDS medicine will cost around $ 4K-5K at [employer].  [facility] reasonable and cheapest option.  Objective:  Blood pressure (!) 97/51, pulse (!) 125, temperature 99.5 F (37.5 C), temperature source Oral, resp. rate 13, height 6' (1.829 m), weight 107.2 kg, SpO2 100%.    Vent Mode: PRVC FiO2 (%):  [30 %] 30 % Set Rate:  [12 bmp] 12 bmp Vt Set:  [470 mL] 470 mL PEEP:  [8 cmH20] 8 cmH20 Plateau Pressure:  [15 cmH20-21 cmH20] 18 cmH20   Intake/Output Summary (Last 24 hours) at 10/13/2023 1304 Last data filed at 10/13/2023 1000 Gross per 24 hour  Intake 2715.73  ml  Output 1475 ml  Net 1240.73 ml   Filed Weights   10/11/23 0705 10/12/23 0500 10/13/23 0500  Weight: 116.5 kg 116.1 kg 107.2 kg   Physical Exam: General: Ill-appearing, lying in bed  HENT: Trach tube in place, stoma site without redness Respiratory:  Clear lungs bilaterally. Cardiovascular: Regular, no murmur GI: Obese, protuberant, positive bowel sounds Extremities: No edema Neuro: Awake and alert.  Communicates by writing with pencil. GU: External foley in place  Labs/imaging personally reviewed:   WBC 7.0 Hb 9.6 SCR 1.07  Assessment & Plan:  Acute hypoxic respiratory failure Multifocal PNA, CAP  Possible pneumocystis pneumonia Aspiration event Dysphagia  Tobacco use S/p tracheostomy with ENT, POD#2. Precedex was stopped yesterday due to concern for drug-induced fevers.  He had fever to 103.5, and tracheal aspirate currently growing gram-positive cocci in chains.  Negative BCx and chest x-ray. Covered broadly with cefepime and metronidazole.  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  - Follow-up blood culture, tracheal aspirate - Cw cefepime and flagyl - Wean vent to trach collar tomorrow - Increase Seroquel to 100 mg @ night . - Klonopin, Seroquel 50 mg( day time) - PJP PCR was negative, on prophylactic atovaquone and rapid steroid taper.    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 Continue statin  DM2, with hyperglycemia, on steroids Both fasting and postprandial hyperglycemia.  - Increase glargine to 34 units BID. - Increase tube feeds coverage to 12units q4hrs.  Normocytic anemia -Follow intermittent CBC  HIV / AIDS Hx +cryptococcal antigen  VL 3410 CD4  36 P -Appreciate ID input -Continue to follow for evidence IRIS as he is not compliant with his HAART outpatient -Continue Descovy, Tivicay as ordered -Continue chronic fluconazole -PJP prophylaxis initiated 3/29 (PCR negative)  Hyperthyroidism r/o  -TSH low T3 normal, T4 normal   THC use  -Cessation counseling when able  Code status - iPAL note 3/19 - DNR status - 3/21 patient self extubated and family reversed to full code - 3/25. Patient and sister agreed to plan for extubation and re-intubation. Would want trach if indicated in the future  Best practice (evaluated daily):  Diet/type: Tube feeds.  DVT prophylaxis: Heparin  Pressure ulcer(s): pressure ulcer assessment deferred  GI prophylaxis: PPI Lines: N/A Foley:  Yes, and it is still needed Code Status:  full code Last date of multidisciplinary goals of care discussion: 3/25. Patient and sister agreed to plan for extubation and re-intubation. Would want trach  Laretta Bolster, M.D.  Internal Medicine Resident, PGY-1 [facility]  Pager: [phone] 1:04 PM, 10/13/2023

## 2023-10-13 NOTE — Plan of Care (Signed)
  Problem: Health Behavior/Discharge Planning: Goal: Ability to identify and utilize available resources and services will improve Outcome: Progressing Goal: Ability to manage health-related needs will improve Outcome: Progressing   Problem: Metabolic: Goal: Ability to maintain appropriate glucose levels will improve Outcome: Progressing   Problem: Nutritional: Goal: Maintenance of adequate nutrition will improve Outcome: Progressing Goal: Progress toward achieving an optimal weight will improve Outcome: Progressing   Problem: Skin Integrity: Goal: Risk for impaired skin integrity will decrease Outcome: Progressing

## 2023-10-14 ENCOUNTER — Ambulatory Visit: Admitting: Internal Medicine

## 2023-10-14 DIAGNOSIS — I639 Cerebral infarction, unspecified: Secondary | ICD-10-CM | POA: Diagnosis not present

## 2023-10-14 DIAGNOSIS — J9601 Acute respiratory failure with hypoxia: Secondary | ICD-10-CM | POA: Diagnosis not present

## 2023-10-14 DIAGNOSIS — R1310 Dysphagia, unspecified: Secondary | ICD-10-CM | POA: Diagnosis not present

## 2023-10-14 LAB — BASIC METABOLIC PANEL WITH GFR
Anion gap: 9 (ref 5–15)
BUN: 29 mg/dL — ABNORMAL HIGH (ref 6–20)
CO2: 31 mmol/L (ref 22–32)
Calcium: 8.2 mg/dL — ABNORMAL LOW (ref 8.9–10.3)
Chloride: 97 mmol/L — ABNORMAL LOW (ref 98–111)
Creatinine, Ser: 1.13 mg/dL (ref 0.61–1.24)
GFR, Estimated: >60 mL/min (ref >60)
Glucose, Bld: 227 mg/dL — ABNORMAL HIGH (ref 70–99)
Potassium: 4.6 mmol/L (ref 3.5–5.1)
Sodium: 137 mmol/L (ref 135–145)

## 2023-10-14 LAB — CBC
HCT: 26.9 % — ABNORMAL LOW (ref 39.0–52.0)
Hemoglobin: 8.4 g/dL — ABNORMAL LOW (ref 13.0–17.0)
MCH: 29.6 pg (ref 26.0–34.0)
MCHC: 31.2 g/dL (ref 30.0–36.0)
MCV: 94.7 fL (ref 80.0–100.0)
Platelets: 197 10*3/uL (ref 150–400)
RBC: 2.84 MIL/uL — ABNORMAL LOW (ref 4.22–5.81)
RDW: 15.3 % (ref 11.5–15.5)
WBC: 5.1 10*3/uL (ref 4.0–10.5)
nRBC: 0 % (ref 0.0–0.2)

## 2023-10-14 LAB — MAGNESIUM: Magnesium: 2.4 mg/dL (ref 1.7–2.4)

## 2023-10-14 LAB — CULTURE, RESPIRATORY W GRAM STAIN

## 2023-10-14 LAB — GLUCOSE, CAPILLARY
Glucose-Capillary: 113 mg/dL — ABNORMAL HIGH (ref 70–99)
Glucose-Capillary: 137 mg/dL — ABNORMAL HIGH (ref 70–99)
Glucose-Capillary: 167 mg/dL — ABNORMAL HIGH (ref 70–99)
Glucose-Capillary: 177 mg/dL — ABNORMAL HIGH (ref 70–99)
Glucose-Capillary: 189 mg/dL — ABNORMAL HIGH (ref 70–99)
Glucose-Capillary: 215 mg/dL — ABNORMAL HIGH (ref 70–99)
Glucose-Capillary: 86 mg/dL (ref 70–99)

## 2023-10-14 LAB — PROCALCITONIN: Procalcitonin: 0.21 ng/mL

## 2023-10-14 MED ORDER — DEXTROSE 10 % IV SOLN: Freq: Every day | INTRAVENOUS | Status: DC | PRN

## 2023-10-14 MED ORDER — LOPERAMIDE HCL 1 MG/7.5ML PO SUSP
2.0000 mg | Freq: Three times a day (TID) | ORAL | Status: DC | PRN
  Administered 2023-10-17 – 2023-10-22 (×4): 2 mg
  Filled 2023-10-14 (×5): qty 15

## 2023-10-14 NOTE — Progress Notes (Signed)
 NAME:  Jeremiah Stevens, MRN:  [id number], DOB:  22-Feb-1976, LOS: 18 ADMISSION DATE:  09/26/2023, CONSULTATION DATE:  09/29/23 REFERRING MD:  Rennis Chris CHIEF COMPLAINT:  Hypoxia   History of Present Illness:  Pt is encephelopathic; therefore, this HPI is obtained from chart review. Jeremiah Stevens is a 48 y.o. adult who has a PMH as below including but not limited to DM2, seizures, HIV AIDS untreated, cryptococcal disease, obesity, polysubstance abuse, prior CVA. She was admitted 09/26/23 with dysphagia x 3 days along with headaches and drooling. He was found to be hypoxic to 80s on room air. CTA was negative for PE but showed multifocal PNA. He was started on abx and BD's. Biktarvy was continued and ID was consulted. 3/18 He had right facial droop, right sided weakness, dysarthria. Neurology was consulted and recommended CTA and MRI. MRI demonstrated acute right medullary infarct. Despite ongoing dysphagia (MBS showed moderately severe pharyngeal phase dysphagia and imaging showed unilateral bulging on right), pt insisted on eating. He reportedly ordered pizza overnight 3/18 and certainly aspirated. He had threatened to leave AMA despite stroke workup ongoing. 3/19, he had hypoxia for which rapid response was called. While being assess, he continued to desaturate down into the 60s and then 40s. He required BVM and was transferred to the ICU for further evaluation and management. Upon arrival in the ICU, she had ongoing desaturations and bradycardia before developing PEA arrest. He had 9 minutes ACLS prior to ROSC including epi x 4 and bicarb x 2. During intubation attempt, she had copious food material and emesis in her oropharynx. Visualization of vocal cords was difficult due to amount of food material and emesis despite suctioning. On 2nd attempt, ETT was passed successfully and saturations improved shortly thereafter. There was grand mal seizure activity noted after intubation that abated after  Midazolam administration.  Pertinent  Medical History:  HIV/AIDS, DM II, seizures, polysubstance use, CVA, cryptococcal disease.   Significant Hospital Events: Including procedures, antibiotic start and stop dates in addition to other pertinent events   3/16 admit 3/18 neuro consult, found to have acute CVA 3/19 Ongoing aspiration despite obvious dysphagia, was planning to leave AMA but sister talked pt into staying. then had a massive aspiration event leading to worse hypoxia and PEA arrest. Txf to [facility] 3/20 PSV cumulative total of 5-6 hrs, following some commands/ purposefully 3/21 self extubated, was reintubated 3/23 Failed SBT due to WOB 3/24 Failed SBT due to apnea 3/25 Tolerated PS >4 hours 3/26: Intubated. 3/31: S/p tracheostomy with ENT.  Interim History / Subjective:  No episodes of agitation overnight after we titrated antipsychotic.  He is more somnolent this morning.  Still on the vent, will do a trial of spontaneous breathing. Fever curve has improved overall.  Objective:  Blood pressure (!) 93/59, pulse (!) 106, temperature (!) 102.5 F (39.2 C), temperature source Oral, resp. rate 18, height 6' (1.829 m), weight 107.2 kg, SpO2 100%.    Vent Mode: PRVC FiO2 (%):  [30 %] 30 % Set Rate:  [12 bmp] 12 bmp Vt Set:  [470 mL] 470 mL PEEP:  [8 cmH20] 8 cmH20 Pressure Support:  [5 cmH20] 5 cmH20 Plateau Pressure:  [16 cmH20-20 cmH20] 18 cmH20   Intake/Output Summary (Last 24 hours) at 10/14/2023 1333 Last data filed at 10/14/2023 1200 Gross per 24 hour  Intake 2446.99 ml  Output 1100 ml  Net 1346.99 ml   Filed Weights   10/11/23 0705 10/12/23 0500 10/13/23 0500  Weight: 116.5 kg  116.1 kg 107.2 kg   Physical Exam: General: Ill-appearing, sitting in chair.   HENT: Trach tube in place, stoma site without redness Respiratory: Clear lungs bilaterally. Cardiovascular: Regular, no murmur GI: Obese, protuberant, positive bowel sounds Extremities: No edema Neuro:  Somnolent, able to follow some commands. GU: External foley in place  Labs/imaging personally reviewed:   WBC 7.0 >>5.4 Hb 9.6 >8.4 SCR 1.07  Assessment & Plan:  Acute hypoxic respiratory failure Multifocal PNA, CAP  Possible pneumocystis pneumonia Aspiration event Dysphagia  Tobacco use Overall fever curve has improved, BCx NGTD at 24 hours.  Tracheal aspirate growing gram-positive cocci in chains. Klonopin and Seroquel was started for agitation on 4/2, however, on exam today, patient is somnolent.  Will discontinue Klonopin, to see if that helps with somnolence.   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  -SBT -Wean vent to trach collar when possible - Discontinue Klonopin -Seroquel 50 mg @ day , Seroquel 100 mg at nighttime - PJP PCR was negative, on prophylactic atovaquone and rapid steroid taper.    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 Continue statin  DM2, with hyperglycemia, on steroids Improved  - Cw glargine 34 units BID. - Cw tube feeds coverage 12units q4hrs.  Normocytic anemia -Follow intermittent CBC  HIV / AIDS Hx +cryptococcal antigen  VL 3410 CD4 36 P -Switched to Tivicay and Truvada as that's affordable for LTAC. -Continue to follow for evidence IRIS as he is not compliant with his HAART outpatient -Continue chronic fluconazole -PJP prophylaxis initiated 3/29 (PCR negative)  Hyperthyroidism r/o  -TSH low T3 normal, T4 normal    THC use  -Cessation counseling when able  Code status - iPAL note 3/19 - DNR status - 3/21 patient self extubated and family reversed to full code - 3/25. Patient and sister agreed to plan for extubation and re-intubation. Would want trach if indicated in the future  Best practice (evaluated daily):  Diet/type: Tube feeds.  DVT prophylaxis: Heparin  Pressure ulcer(s): pressure ulcer assessment deferred  GI prophylaxis: PPI Lines: N/A Foley:  Yes, and it is still needed Code Status:  full code Last date of multidisciplinary goals of care discussion: 3/25. Patient and sister agreed to plan for extubation and re-intubation.   Laretta Bolster, M.D.  Internal Medicine Resident, PGY-1 [facility]  Pager: [phone] 1:33 PM, 10/14/2023

## 2023-10-14 NOTE — Plan of Care (Signed)
  Problem: Metabolic: Goal: Ability to maintain appropriate glucose levels will improve Outcome: Progressing   Problem: Nutritional: Goal: Maintenance of adequate nutrition will improve Outcome: Progressing   Problem: Skin Integrity: Goal: Risk for impaired skin integrity will decrease Outcome: Progressing   Problem: Tissue Perfusion: Goal: Adequacy of tissue perfusion will improve Outcome: Progressing   

## 2023-10-14 NOTE — Progress Notes (Addendum)
 Physical Therapy Treatment Patient Details Name: Jeremiah Stevens MRN: [id number] DOB: Jan 05, 1976 Today's Date: 10/14/2023   History of Present Illness 48 yo pt admitted to [facility] 09/26/23 for difficulty swallowing. 3/19 PEA, ROSC after 2 min, then bradycardic arrest 1 min later, CPR resumed, pt intubated, and ROSC 6 min later. Seizure-like activity also noted briefly after ROSC. 3/20 pt transferred to [facility]. 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 3/21 self extubated and reintubated. 3/31 trach. PMHx: HIV/AIDS, HTN, herpes, migraine, depression, DM II, polysubstance use, and seizure.    PT Comments  Pt pleasant with increased movement of right side grossly 2+/5 LUE and LLE. Pt lifted to chair via maxisky for bed exchange with improved sitting balance. Pt able to use UB to pull trunk fully off chair and perform UB and LB HEP in sitting. Pt with improved communication with ability to mouth words with trach and clearly state yes/no over vent. Pt wanting to go to the store and have food with education for continued vent and not able to have food or leave ICU yet. Will continue to follow.   FIO2 30%, PRVC, peep 8, SPO2 100% HR 111-130    If plan is discharge home, recommend the following: Assistance with cooking/housework;Direct supervision/assist for medications management;Direct supervision/assist for financial management;Assist for transportation;Help with stairs or ramp for entrance;Supervision due to cognitive status;Two people to help with bathing/dressing/bathroom;Two people to help with walking and/or transfers   Can travel by private Theme park manager cushion (measurements PT);Wheelchair (measurements PT);Hoyer lift;Hospital bed    Recommendations for Other Services       Precautions / Restrictions Precautions Precautions: Fall;Other (comment) Recall of Precautions/Restrictions: Impaired Precaution/Restrictions  Comments: trach to vent, cortrak, primofit     Mobility  Bed Mobility Overal bed mobility: Needs Assistance Bed Mobility: Rolling Rolling: Mod assist         General bed mobility comments: mod +2 safety to roll bil in bed to manage lines and place lift pad. lift to transition to full sitting and transferred bed to chair via maxi sky    Transfers Overall transfer level: Needs assistance   Transfers: Bed to chair/wheelchair/BSC             General transfer comment: total assist to lift to chair Transfer via Lift Equipment: Maxisky  Ambulation/Gait                   Stairs             Wheelchair Mobility     Tilt Bed    Modified Rankin (Stroke Patients Only) Modified Rankin (Stroke Patients Only) Pre-Morbid Rankin Score: No symptoms Modified Rankin: Severe disability     Balance Overall balance assessment: Needs assistance   Sitting balance-Leahy Scale: Zero                                      Communication Communication Communication: Impaired Factors Affecting Communication: Trach/intubated  Cognition Arousal: Alert Behavior During Therapy: WFL for tasks assessed/performed   PT - Cognitive impairments: Attention, Awareness, Problem solving, Safety/Judgement, Memory Difficult to assess due to: Tracheostomy                     PT - Cognition Comments: pt follows commands with increased time, appears to respond appropriately to yes/no questions. asking to have  food and go to the store so unaware of situation and vent Following commands: Impaired Following commands impaired: Follows one step commands with increased time    Cueing Cueing Techniques: Verbal cues, Tactile cues  Exercises General Exercises - Upper Extremity Shoulder Flexion: Right, Seated, 10 reps, Left, AAROM, AROM (AAROM on RUE, AROM on LUE) General Exercises - Lower Extremity Long Arc Quad: AROM, Strengthening, Both, Seated, 10 reps    General  Comments        Pertinent Vitals/Pain Pain Assessment Pain Score: 3  Pain Location: pt reports hunger Pain Descriptors / Indicators: Aching, Sore Pain Intervention(s): Monitored during session, Repositioned    Home Living                          Prior Function            PT Goals (current goals can now be found in the care plan section) Acute Rehab PT Goals Patient Stated Goal: to return home PT Goal Formulation: With patient Time For Goal Achievement: 10/28/23 Potential to Achieve Goals: Fair Progress towards PT goals: Goals downgraded-see care plan    Frequency    Min 2X/week      PT Plan      Co-evaluation              AM-PAC PT "6 Clicks" Mobility   Outcome Measure  Help needed turning from your back to your side while in a flat bed without using bedrails?: A Lot Help needed moving from lying on your back to sitting on the side of a flat bed without using bedrails?: Total Help needed moving to and from a bed to a chair (including a wheelchair)?: Total Help needed standing up from a chair using your arms (e.g., wheelchair or bedside chair)?: Total Help needed to walk in hospital room?: Total Help needed climbing 3-5 steps with a railing? : Total 6 Click Score: 7    End of Session Equipment Utilized During Treatment: Other (comment) (vent) Activity Tolerance: Patient tolerated treatment well Patient left: in chair;with call bell/phone within reach;with chair alarm set;with nursing/sitter in room Nurse Communication: Mobility status;Need for lift equipment PT Visit Diagnosis: Unsteadiness on feet (R26.81);Hemiplegia and hemiparesis;Muscle weakness (generalized) (M62.81);Other abnormalities of gait and mobility (R26.89);Difficulty in walking, not elsewhere classified (R26.2);Other symptoms and signs involving the nervous system (R29.898) Hemiplegia - Right/Left: Right Hemiplegia - dominant/non-dominant: Non-dominant Hemiplegia - caused by:  Cerebral infarction     Time: 0902-0930 PT Time Calculation (min) (ACUTE ONLY): 28 min  Charges:    $Therapeutic Activity: 23-37 mins PT General Charges $$ ACUTE PT VISIT: 1 Visit                     Merryl Hacker, PT Acute Rehabilitation Services Office: [phone]    Enedina Finner Jemell Town 10/14/2023, 9:45 AM

## 2023-10-15 DIAGNOSIS — R1310 Dysphagia, unspecified: Secondary | ICD-10-CM | POA: Diagnosis not present

## 2023-10-15 DIAGNOSIS — I639 Cerebral infarction, unspecified: Secondary | ICD-10-CM | POA: Diagnosis not present

## 2023-10-15 DIAGNOSIS — J9601 Acute respiratory failure with hypoxia: Secondary | ICD-10-CM | POA: Diagnosis not present

## 2023-10-15 LAB — GLUCOSE, CAPILLARY
Glucose-Capillary: 175 mg/dL — ABNORMAL HIGH (ref 70–99)
Glucose-Capillary: 170 mg/dL — ABNORMAL HIGH (ref 70–99)
Glucose-Capillary: 149 mg/dL — ABNORMAL HIGH (ref 70–99)
Glucose-Capillary: 142 mg/dL — ABNORMAL HIGH (ref 70–99)
Glucose-Capillary: 144 mg/dL — ABNORMAL HIGH (ref 70–99)
Glucose-Capillary: 148 mg/dL — ABNORMAL HIGH (ref 70–99)

## 2023-10-15 LAB — CBC
HCT: 28.9 % — ABNORMAL LOW (ref 39.0–52.0)
Hemoglobin: 8.7 g/dL — ABNORMAL LOW (ref 13.0–17.0)
MCH: 28.9 pg (ref 26.0–34.0)
MCHC: 30.1 g/dL (ref 30.0–36.0)
MCV: 96 fL (ref 80.0–100.0)
Platelets: 223 10*3/uL (ref 150–400)
RBC: 3.01 MIL/uL — ABNORMAL LOW (ref 4.22–5.81)
RDW: 15.5 % (ref 11.5–15.5)
WBC: 4.3 10*3/uL (ref 4.0–10.5)
nRBC: 0 % (ref 0.0–0.2)

## 2023-10-15 LAB — BASIC METABOLIC PANEL WITH GFR
Anion gap: 6 (ref 5–15)
BUN: 27 mg/dL — ABNORMAL HIGH (ref 6–20)
CO2: 35 mmol/L — ABNORMAL HIGH (ref 22–32)
Calcium: 8.3 mg/dL — ABNORMAL LOW (ref 8.9–10.3)
Chloride: 100 mmol/L (ref 98–111)
Creatinine, Ser: 1.27 mg/dL — ABNORMAL HIGH (ref 0.61–1.24)
GFR, Estimated: >60 mL/min (ref >60)
Glucose, Bld: 195 mg/dL — ABNORMAL HIGH (ref 70–99)
Potassium: 4.4 mmol/L (ref 3.5–5.1)
Sodium: 141 mmol/L (ref 135–145)

## 2023-10-15 LAB — MAGNESIUM: Magnesium: 2.6 mg/dL — ABNORMAL HIGH (ref 1.7–2.4)

## 2023-10-15 LAB — PROCALCITONIN: Procalcitonin: 0.18 ng/mL

## 2023-10-15 MED ORDER — CLOPIDOGREL BISULFATE 75 MG PO TABS
75.0000 mg | ORAL_TABLET | Freq: Every day | ORAL | Status: DC
  Administered 2023-10-16 – 2023-10-23 (×8): 75 mg
  Filled 2023-10-15 (×8): qty 1

## 2023-10-15 MED ORDER — OXYCODONE HCL 5 MG PO TABS
5.0000 mg | ORAL_TABLET | ORAL | Status: DC | PRN
  Administered 2023-10-19 – 2023-10-29 (×6): 5 mg via ORAL
  Filled 2023-10-15 (×8): qty 1

## 2023-10-15 MED ORDER — SODIUM CHLORIDE 0.9 % IV SOLN
2.0000 g | Freq: Three times a day (TID) | INTRAVENOUS | Status: AC
  Administered 2023-10-15 – 2023-10-22 (×20): 2 g via INTRAVENOUS
  Filled 2023-10-15 (×21): qty 12.5

## 2023-10-15 MED ORDER — PAROXETINE HCL 20 MG PO TABS
20.0000 mg | ORAL_TABLET | Freq: Every day | ORAL | Status: DC
  Administered 2023-10-15 – 2023-11-08 (×24): 20 mg
  Filled 2023-10-15 (×26): qty 1

## 2023-10-15 MED ORDER — EMTRICITABINE-TENOFOVIR DF 200-300 MG PO TABS
1.0000 | ORAL_TABLET | Freq: Every day | ORAL | Status: DC
  Administered 2023-10-15 – 2023-11-09 (×25): 1
  Filled 2023-10-15 (×26): qty 1

## 2023-10-15 MED ORDER — HYDROXYZINE HCL 25 MG PO TABS
25.0000 mg | ORAL_TABLET | Freq: Three times a day (TID) | ORAL | Status: DC | PRN
  Administered 2023-10-15 – 2023-10-28 (×9): 25 mg
  Filled 2023-10-15 (×9): qty 1

## 2023-10-15 MED ORDER — INSULIN ASPART 100 UNIT/ML IJ SOLN
12.0000 [IU] | INTRAMUSCULAR | Status: DC
  Administered 2023-10-15 – 2023-10-17 (×6): 12 [IU] via SUBCUTANEOUS

## 2023-10-15 NOTE — Progress Notes (Signed)
 Physical Therapy Treatment Patient Details Name: Jeremiah Stevens MRN: [id number] DOB: 06-23-1976 Today's Date: 10/15/2023   History of Present Illness 48 yo pt admitted to [facility] 09/26/23 for difficulty swallowing. 3/19 PEA, ROSC after 2 min, then bradycardic arrest 1 min later, CPR resumed, pt intubated, and ROSC 6 min later. Seizure-like activity also noted briefly after ROSC. 3/20 pt transferred to [facility]. 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 3/21 self extubated and reintubated. 3/31 trach. PMHx: HIV/AIDS, HTN, herpes, migraine, depression, DM II, polysubstance use, and seizure.    PT Comments  Jeremiah Stevens with significant eye rolling today when complemented on hair do. She was on PS/CPAP at 30%, peep 8, SPO2 100% throughout session with a few periods of brief desaturation into the 80s and RR up to 50 but able to recover with static positioning and cues to control breathing. Pt disconnected self from vent x 3 during session despite education for tubing placement and precautions. She demonstrated increased ability with rolling, pulling trunk from surface, scooting hips in chair with use of UB and HEP but denied any attempts for standing due to hunger. She is able to perform oral suction without assist with significant secretions during transition to sitting. Will continue to follow. Pt in chair via lift with alarm on and RN present and aware.  HR 110-130 with activity 136/77 (93)   If plan is discharge home, recommend the following: Assistance with cooking/housework;Direct supervision/assist for medications management;Direct supervision/assist for financial management;Assist for transportation;Help with stairs or ramp for entrance;Supervision due to cognitive status;Two people to help with bathing/dressing/bathroom;Two people to help with walking and/or transfers   Can travel by private Theme park manager cushion (measurements  PT);Wheelchair (measurements PT);Hoyer lift;Hospital bed    Recommendations for Other Services       Precautions / Restrictions Precautions Precautions: Fall;Other (comment) Recall of Precautions/Restrictions: Impaired Precaution/Restrictions Comments: trach to vent, cortrak, primofit- needs min cues to watch arm placement to prevent disconnecting from vent     Mobility  Bed Mobility Overal bed mobility: Needs Assistance Bed Mobility: Rolling, Supine to Sit Rolling: Mod assist, Min assist         General bed mobility comments: min assist to roll right, mod assist to roll left. physical assist to place pt under pt in bed and lift via maxisky to full sitting and transfer to chair. In sitting pt able to pull trunk fully off surface wtih UB    Transfers                   General transfer comment: lift to chair. she denied attempting standing this date due to hunger    Ambulation/Gait                   Stairs             Wheelchair Mobility     Tilt Bed    Modified Rankin (Stroke Patients Only) Modified Rankin (Stroke Patients Only) Pre-Morbid Rankin Score: No symptoms Modified Rankin: Severe disability     Balance Overall balance assessment: Needs assistance Sitting-balance support: Single extremity supported, Bilateral upper extremity supported, Feet supported Sitting balance-Leahy Scale: Fair                                      Communication Communication Communication: Impaired Factors Affecting Communication: Trach/intubated  Cognition Arousal: Alert Behavior During Therapy: WFL for tasks assessed/performed   PT - Cognitive impairments: Attention, Awareness, Problem solving, Safety/Judgement, Memory, Difficult to assess Difficult to assess due to: Tracheostomy                     PT - Cognition Comments: pt follows commands with increased time, appears to respond appropriately to yes/no questions. reports  hunger limiting function, disconnected self from vent x 3 during session due to placing arm on vent tubing despite education Following commands: Impaired Following commands impaired: Follows one step commands with increased time    Cueing Cueing Techniques: Verbal cues, Tactile cues  Exercises General Exercises - Upper Extremity Shoulder Flexion: Right, Seated, 10 reps, AAROM General Exercises - Lower Extremity Long Arc Quad: AROM, Right, Seated, 10 reps Hip Flexion/Marching: AROM, Right, Seated, 10 reps    General Comments        Pertinent Vitals/Pain Pain Assessment Pain Score: 4  Pain Location: pt reports hunger and abdominal pain Pain Descriptors / Indicators: Aching Pain Intervention(s): Limited activity within patient's tolerance, Monitored during session, Repositioned    Home Living                          Prior Function            PT Goals (current goals can now be found in the care plan section) Progress towards PT goals: Progressing toward goals    Frequency    Min 2X/week      PT Plan      Co-evaluation              AM-PAC PT "6 Clicks" Mobility   Outcome Measure  Help needed turning from your back to your side while in a flat bed without using bedrails?: A Lot Help needed moving from lying on your back to sitting on the side of a flat bed without using bedrails?: Total Help needed moving to and from a bed to a chair (including a wheelchair)?: Total Help needed standing up from a chair using your arms (e.g., wheelchair or bedside chair)?: Total Help needed to walk in hospital room?: Total Help needed climbing 3-5 steps with a railing? : Total 6 Click Score: 7    End of Session Equipment Utilized During Treatment: Other (comment) (vent) Activity Tolerance: Patient tolerated treatment well Patient left: in chair;with call bell/phone within reach;with chair alarm set;with nursing/sitter in room Nurse Communication: Mobility  status;Need for lift equipment PT Visit Diagnosis: Unsteadiness on feet (R26.81);Hemiplegia and hemiparesis;Muscle weakness (generalized) (M62.81);Other abnormalities of gait and mobility (R26.89);Difficulty in walking, not elsewhere classified (R26.2);Other symptoms and signs involving the nervous system (R29.898) Hemiplegia - Right/Left: Right Hemiplegia - dominant/non-dominant: Non-dominant Hemiplegia - caused by: Cerebral infarction     Time: 1610-9604 PT Time Calculation (min) (ACUTE ONLY): 30 min  Charges:    $Therapeutic Activity: 23-37 mins PT General Charges $$ ACUTE PT VISIT: 1 Visit                     Merryl Hacker, PT Acute Rehabilitation Services Office: [phone]    Betzaira Mentel B Doneen Ollinger 10/15/2023, 10:20 AM

## 2023-10-15 NOTE — Progress Notes (Signed)
 Nutrition Follow-up  DOCUMENTATION CODES:   Obesity unspecified  INTERVENTION:   Continue tube feeding via cortrak tube: Osmolite 1.5 at 7mL/h x 18 hours (1530 ml per day, infuse from 12PM to 6AM) Ensure that tube feed is held 2 hours prior to Tivicay and 1 hour after (from 8:30AM-11:30AM) Prosource TF20 60 ml 1x/d Free water flushes q4 hours Provides 2375 kcal, 116 gm protein, 1166 ml free water daily (TF + flush = 1766 mL free water) MVI with minerals daily via tube.  Continue Banatrol TF BID via tube.   NUTRITION DIAGNOSIS:   Increased nutrient needs related to chronic illness as evidenced by estimated needs.  Ongoing   GOAL:   Patient will meet greater than or equal to 90% of their needs  Met with TF  MONITOR:   TF tolerance  REASON FOR ASSESSMENT:   Consult Enteral/tube feeding initiation and management  ASSESSMENT:   48 y.o. adult with medical history significant of diabetes type 2, seizure disorder, HIV untreated, obesity, polysubstance abuse .  Patient is admitted for pneumonia with hypoxia in the setting of HIV.  3/31: S/P tracheostomy  TOC consulted for [facility] evaluation. Working on vent weaning.  Trach aspirate was positive for gram positive cocci, on IV antibiotics.   Per discussion with RN, patient is tolerating TF well. TF is currently being held for 3-4 hours each day for Tivicay administration. Patient is having a lot of loose stools, so she is receiving Banatrol TF BID.   Remains on ventilator support via trach. Patient sitting up in chair during RD visit.  MV: 12.5 L/min Temp (24hrs), Avg:99.3 F (37.4 C), Min:98.8 F (37.1 C), Max:99.9 F (37.7 C)   Labs reviewed.  CBG: [phone]  Medications reviewed and include tivicay, banatrol TF, novolog, semglee, MVI with minerals, protonix, IV antibiotics.  Admit weight 115.8 kg (3/17) Current weight 107.1 kg (4/4)   Intake/Output Summary (Last 24 hours) at 10/15/2023 1147 Last data  filed at 10/15/2023 0600 Gross per 24 hour  Intake 2037.97 ml  Output 1200 ml  Net 837.97 ml    Diet Order:   Diet Order             Diet NPO time specified  Diet effective now                   EDUCATION NEEDS:   Education needs have been addressed  Skin:  Skin Assessment: Reviewed RN Assessment  Last BM:  4/4  Height:   Ht Readings from Last 1 Encounters:  10/07/23 6' (1.829 m)    Weight:   Wt Readings from Last 1 Encounters:  10/15/23 107.1 kg    Ideal Body Weight:  80.9 kg  BMI:  Body mass index is 32.02 kg/m.  Estimated Nutritional Needs:   Kcal:  2200-2500 kcal/d  Protein:  110-120g  Fluid:  2.4L/day   Gabriel Rainwater RD, LDN, CNSC Contact via secure chat. If unavailable, use group chat "RD Inpatient."

## 2023-10-15 NOTE — Plan of Care (Signed)
  Problem: Health Behavior/Discharge Planning: Goal: Ability to identify and utilize available resources and services will improve Outcome: Progressing Goal: Ability to manage health-related needs will improve Outcome: Progressing   Problem: Metabolic: Goal: Ability to maintain appropriate glucose levels will improve Outcome: Progressing   Problem: Nutritional: Goal: Maintenance of adequate nutrition will improve Outcome: Progressing Goal: Progress toward achieving an optimal weight will improve Outcome: Progressing   Problem: Skin Integrity: Goal: Risk for impaired skin integrity will decrease Outcome: Progressing   Problem: Tissue Perfusion: Goal: Adequacy of tissue perfusion will improve Outcome: Progressing   Problem: Education: Goal: Knowledge of General Education information will improve Description: Including pain rating scale, medication(s)/side effects and non-pharmacologic comfort measures Outcome: Progressing   Problem: Health Behavior/Discharge Planning: Goal: Ability to manage health-related needs will improve Outcome: Progressing   Problem: Clinical Measurements: Goal: Ability to maintain clinical measurements within normal limits will improve Outcome: Progressing Goal: Will remain free from infection Outcome: Progressing Goal: Diagnostic test results will improve Outcome: Progressing Goal: Respiratory complications will improve Outcome: Progressing Goal: Cardiovascular complication will be avoided Outcome: Progressing   Problem: Activity: Goal: Risk for activity intolerance will decrease Outcome: Progressing   Problem: Nutrition: Goal: Adequate nutrition will be maintained Outcome: Progressing   Problem: Coping: Goal: Level of anxiety will decrease Outcome: Progressing   Problem: Elimination: Goal: Will not experience complications related to bowel motility Outcome: Progressing Goal: Will not experience complications related to urinary  retention Outcome: Progressing   Problem: Pain Managment: Goal: General experience of comfort will improve and/or be controlled Outcome: Progressing   Problem: Safety: Goal: Ability to remain free from injury will improve Outcome: Progressing   Problem: Skin Integrity: Goal: Risk for impaired skin integrity will decrease Outcome: Progressing   Problem: Activity: Goal: Ability to tolerate increased activity will improve Outcome: Progressing   Problem: Clinical Measurements: Goal: Ability to maintain a body temperature in the normal range will improve Outcome: Progressing   Problem: Respiratory: Goal: Ability to maintain adequate ventilation will improve Outcome: Progressing Goal: Ability to maintain a clear airway will improve Outcome: Progressing   Problem: Education: Goal: Knowledge of disease or condition will improve Outcome: Progressing Goal: Knowledge of secondary prevention will improve (MUST DOCUMENT ALL) Outcome: Progressing Goal: Knowledge of patient specific risk factors will improve (DELETE if not current risk factor) Outcome: Progressing   Problem: Ischemic Stroke/TIA Tissue Perfusion: Goal: Complications of ischemic stroke/TIA will be minimized Outcome: Progressing   Problem: Coping: Goal: Will verbalize positive feelings about self Outcome: Progressing Goal: Will identify appropriate support needs Outcome: Progressing   Problem: Health Behavior/Discharge Planning: Goal: Ability to manage health-related needs will improve Outcome: Progressing Goal: Goals will be collaboratively established with patient/family Outcome: Progressing   Problem: Self-Care: Goal: Ability to participate in self-care as condition permits will improve Outcome: Progressing Goal: Verbalization of feelings and concerns over difficulty with self-care will improve Outcome: Progressing Goal: Ability to communicate needs accurately will improve Outcome: Progressing   Problem:  Nutrition: Goal: Risk of aspiration will decrease Outcome: Progressing Goal: Dietary intake will improve Outcome: Progressing   Problem: Activity: Goal: Ability to tolerate increased activity will improve Outcome: Progressing   Problem: Respiratory: Goal: Ability to maintain a clear airway and adequate ventilation will improve Outcome: Progressing   Problem: Role Relationship: Goal: Method of communication will improve Outcome: Progressing   Problem: Nutrition: Goal: Dietary intake will improve Outcome: Progressing

## 2023-10-15 NOTE — Progress Notes (Signed)
 Pt is was desynchronous with the ventilator. After changing through different modes, pt stated that PRVC SIMV PSV was the most comfortable. Elink aware and okay with vent changes.    10/15/23 0325  Vent Select  Invasive or Noninvasive Invasive  Adult Vent Y  Tracheostomy Shiley Flexible 8 mm Cuffed  Placement Date/Time: 10/11/23 1200   Brand: Shiley Flexible  Size (mm): 8 mm  Style: Cuffed  Status Secured with trach ties and sutures  Site Assessment Clean;Dry  Site Care Cleansed;Dried  Inner Cannula Care Changed/new  Ties Assessment Clean, Dry  Cuff Pressure (cm H2O) Green OR 18-26 CmH2O  Tracheostomy Equipment at bedside Yes and checklist posted at head of bed  Adult Ventilator Settings  Vent Type Servo i  Humidity HME  Vent Mode (S)  PRVC;SIMV;PSV  Vt Set 470 mL  Set Rate 12 bmp  FiO2 (%) 30 %  I Time 0.9 Sec(s)  Pressure Support 10 cmH20  PEEP 8 cmH20  Adult Ventilator Measurements  Peak Airway Pressure 25 L/min  Mean Airway Pressure 16 cmH20  Plateau Pressure 19 cmH20  Resp Rate Spontaneous 20 br/min  Resp Rate Total 32 br/min  Exhaled Vt 541 mL  Measured Ve 12.1 L  I:E Ratio Measured 1:1.9  Auto PEEP 0 cmH20  Total PEEP 8 cmH20  SpO2 100 %  Adult Ventilator Alarms  Alarms On Y  Ve High Alarm 20 L/min  Ve Low Alarm 4 L/min  Resp Rate High Alarm 48 br/min  Resp Rate Low Alarm 3  PEEP Low Alarm 3 cmH2O  Press High Alarm 48 cmH2O  VAP Prevention  HOB> 30 Degrees Y  Breath Sounds  Bilateral Breath Sounds Diminished;Rhonchi  Vent Respiratory Assessment  Respiratory Pattern Regular;Labored  Suction Method  Respiratory Interventions Airway suction  Airway Suctioning/Secretions  Suction Type Tracheal  Suction Device  Catheter  Secretion Amount Copious  Secretion Color Tan  Secretion Consistency Thick  Suction Tolerance Tolerated fairly well  Suctioning Adverse Effects Anxiety

## 2023-10-15 NOTE — Progress Notes (Signed)
 eLink Physician-Brief Progress Note Patient Name: Jeremiah Stevens DOB: 08/16/1975 MRN: [id number]   Date of Service  10/15/2023  HPI/Events of Note  Patient is having some ventilatory dyssynchrony despite pressure control, spontaneous ventilation, and PRVC with SIMV.  Seems like the patient is having hiccups and is not otherwise distressed.  Adequately ventilating and adequately oxygenating.  eICU Interventions  No immediate intervention is indicated        Congetta Odriscoll 10/15/2023, 4:15 AM

## 2023-10-15 NOTE — Progress Notes (Addendum)
 NAME:  Jeremiah Stevens, MRN:  [id number], DOB:  26-Apr-1976, LOS: 19 ADMISSION DATE:  09/26/2023, CONSULTATION DATE:  09/29/23 REFERRING MD:  Rennis Chris CHIEF COMPLAINT:  Hypoxia   History of Present Illness:  Pt is encephelopathic; therefore, this HPI is obtained from chart review. Jeremiah Stevens is a 48 y.o. adult who has a PMH as below including but not limited to DM2, seizures, HIV AIDS untreated, cryptococcal disease, obesity, polysubstance abuse, prior CVA. She was admitted 09/26/23 with dysphagia x 3 days along with headaches and drooling. He was found to be hypoxic to 80s on room air. CTA was negative for PE but showed multifocal PNA. He was started on abx and BD's. Biktarvy was continued and ID was consulted. 3/18 He had right facial droop, right sided weakness, dysarthria. Neurology was consulted and recommended CTA and MRI. MRI demonstrated acute right medullary infarct. Despite ongoing dysphagia (MBS showed moderately severe pharyngeal phase dysphagia and imaging showed unilateral bulging on right), pt insisted on eating. He reportedly ordered pizza overnight 3/18 and certainly aspirated. He had threatened to leave AMA despite stroke workup ongoing. 3/19, he had hypoxia for which rapid response was called. While being assess, he continued to desaturate down into the 60s and then 40s. He required BVM and was transferred to the ICU for further evaluation and management. Upon arrival in the ICU, she had ongoing desaturations and bradycardia before developing PEA arrest. He had 9 minutes ACLS prior to ROSC including epi x 4 and bicarb x 2. During intubation attempt, she had copious food material and emesis in her oropharynx. Visualization of vocal cords was difficult due to amount of food material and emesis despite suctioning. On 2nd attempt, ETT was passed successfully and saturations improved shortly thereafter. There was grand mal seizure activity noted after intubation that abated after  Midazolam administration.  Pertinent  Medical History:  HIV/AIDS, DM II, seizures, polysubstance use, CVA, cryptococcal disease.   Significant Hospital Events: Including procedures, antibiotic start and stop dates in addition to other pertinent events   3/16 admit 3/18 neuro consult, found to have acute CVA 3/19 Ongoing aspiration despite obvious dysphagia, was planning to leave AMA but sister talked pt into staying. then had a massive aspiration event leading to worse hypoxia and PEA arrest. Txf to [facility] 3/20 PSV cumulative total of 5-6 hrs, following some commands/ purposefully 3/21 self extubated, was reintubated 3/23 Failed SBT due to WOB 3/24 Failed SBT due to apnea 3/25 Tolerated PS >4 hours 3/26: Intubated. 3/31: S/p tracheostomy with ENT.  Interim History / Subjective:  Fever curve is improved.  Intermittent dyssynchrony on the vent, was switched to [facility].    She is more awake and alert this morning, slightly anxious.  Objective:  Blood pressure 132/76, pulse (!) 119, temperature 99.3 F (37.4 C), temperature source Oral, resp. rate (!) 31, height 6' (1.829 m), weight 107.1 kg, SpO2 100%.    Vent Mode: PRVC;SIMV;PSV FiO2 (%):  [30 %] 30 % Set Rate:  [12 bmp] 12 bmp Vt Set:  [470 mL] 470 mL PEEP:  [8 cmH20] 8 cmH20 Pressure Support:  [5 cmH20-10 cmH20] 10 cmH20 Plateau Pressure:  [17 cmH20-24 cmH20] 19 cmH20   Intake/Output Summary (Last 24 hours) at 10/15/2023 0745 Last data filed at 10/15/2023 0600 Gross per 24 hour  Intake 2127.07 ml  Output 1200 ml  Net 927.07 ml   Filed Weights   10/12/23 0500 10/13/23 0500 10/15/23 0441  Weight: 116.1 kg 107.2 kg 107.1 kg  Physical Exam: General: Ill-appearing, sitting in chair.   HENT: Trach tube in place, stoma site without redness Respiratory: Clear lungs bilaterally. Cardiovascular: Regular, no murmur GI: Obese, protuberant, positive bowel sounds Extremities: No edema Neuro: Somnolent, able to follow some  commands. GU: External foley in place  Labs/imaging personally reviewed:   WBC 5.4 >>8.2 Hb 8.4 >>8.7 SCR 1.07  Assessment & Plan:  Acute hypoxic respiratory failure Multifocal PNA, CAP  Possible pneumocystis pneumonia Aspiration event Dysphagia  Tobacco use Fever curve improved, no leukocytosis. dyssynchronous on the vent as a result of agitation.  Previously was on paroxetine for anxiety/depression.  Will optimize pain regimen with oxy and Atarax PRN, and restart SSRI.  Tracheal aspirate grew gram-positive cocci in pairs, no anaerobes, will treat with cefepime for 7 days Medically stable to leave ICU, waiting on insurance authorization to LTAC.  -SBT, and Wean vent to trach collar when possible -  - #3/7 Cefepime.  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 Continue statin  DM2, with hyperglycemia, on steroids Improved  - Cw glargine 34 units BID. - Cw tube feeds coverage 12units q4hrs.  Normocytic anemia -Follow intermittent CBC  HIV / AIDS Hx +cryptococcal antigen  VL 3410 CD4 36 P -Switched to [place] and Truvada as that's affordable for LTAC. -Continue to follow for evidence IRIS as he is not compliant with his HAART outpatient -Continue chronic fluconazole -PJP prophylaxis initiated 3/29 (PCR negative)  Hyperthyroidism r/o  -TSH low T3 normal, T4 normal   THC use   -Cessation counseling when able  Code status - iPAL note 3/19 - DNR status - 3/21 patient self extubated and family reversed to full code - 3/25. Patient and sister agreed to plan for extubation and re-intubation. Would want trach if indicated in the future  Best practice (evaluated daily):  Diet/type: Tube feeds.  DVT prophylaxis: Heparin  Pressure ulcer(s): pressure ulcer assessment deferred  GI prophylaxis: PPI Lines: N/A Foley:  Yes, and it is still needed Code Status:  full code Last date of multidisciplinary goals of care discussion: 3/25. Patient and sister agreed to plan for extubation and re-intubation.   Laretta Bolster, M.D.  Internal Medicine Resident, PGY-1 [facility]  Pager: [phone] 7:45 AM, 10/15/2023

## 2023-10-16 DIAGNOSIS — B20 Human immunodeficiency virus [HIV] disease: Secondary | ICD-10-CM | POA: Diagnosis not present

## 2023-10-16 DIAGNOSIS — J189 Pneumonia, unspecified organism: Secondary | ICD-10-CM | POA: Diagnosis not present

## 2023-10-16 DIAGNOSIS — I639 Cerebral infarction, unspecified: Secondary | ICD-10-CM | POA: Diagnosis not present

## 2023-10-16 DIAGNOSIS — J9601 Acute respiratory failure with hypoxia: Secondary | ICD-10-CM | POA: Diagnosis not present

## 2023-10-16 LAB — BASIC METABOLIC PANEL WITH GFR
Anion gap: 5 (ref 5–15)
BUN: 22 mg/dL — ABNORMAL HIGH (ref 6–20)
CO2: 31 mmol/L (ref 22–32)
Calcium: 8.1 mg/dL — ABNORMAL LOW (ref 8.9–10.3)
Chloride: 103 mmol/L (ref 98–111)
Creatinine, Ser: 0.98 mg/dL (ref 0.61–1.24)
GFR, Estimated: >60 mL/min (ref >60)
Glucose, Bld: 175 mg/dL — ABNORMAL HIGH (ref 70–99)
Potassium: 4.3 mmol/L (ref 3.5–5.1)
Sodium: 139 mmol/L (ref 135–145)

## 2023-10-16 LAB — CBC
HCT: 26 % — ABNORMAL LOW (ref 39.0–52.0)
Hemoglobin: 8.1 g/dL — ABNORMAL LOW (ref 13.0–17.0)
MCH: 29.6 pg (ref 26.0–34.0)
MCHC: 31.2 g/dL (ref 30.0–36.0)
MCV: 94.9 fL (ref 80.0–100.0)
Platelets: 249 10*3/uL (ref 150–400)
RBC: 2.74 MIL/uL — ABNORMAL LOW (ref 4.22–5.81)
RDW: 15.3 % (ref 11.5–15.5)
WBC: 4.3 10*3/uL (ref 4.0–10.5)
nRBC: 0 % (ref 0.0–0.2)

## 2023-10-16 LAB — GLUCOSE, CAPILLARY
Glucose-Capillary: 107 mg/dL — ABNORMAL HIGH (ref 70–99)
Glucose-Capillary: 116 mg/dL — ABNORMAL HIGH (ref 70–99)
Glucose-Capillary: 153 mg/dL — ABNORMAL HIGH (ref 70–99)
Glucose-Capillary: 155 mg/dL — ABNORMAL HIGH (ref 70–99)
Glucose-Capillary: 184 mg/dL — ABNORMAL HIGH (ref 70–99)
Glucose-Capillary: 186 mg/dL — ABNORMAL HIGH (ref 70–99)
Glucose-Capillary: 57 mg/dL — ABNORMAL LOW (ref 70–99)
Glucose-Capillary: 73 mg/dL (ref 70–99)

## 2023-10-16 LAB — HEPATIC FUNCTION PANEL
ALT: 29 U/L (ref 0–44)
AST: 19 U/L (ref 15–41)
Albumin: 2.2 g/dL — ABNORMAL LOW (ref 3.5–5.0)
Alkaline Phosphatase: 78 U/L (ref 38–126)
Bilirubin, Direct: <0.1 mg/dL (ref 0.0–0.2)
Total Bilirubin: 0.4 mg/dL (ref 0.0–1.2)
Total Protein: 7.1 g/dL (ref 6.5–8.1)

## 2023-10-16 LAB — MAGNESIUM: Magnesium: 2.4 mg/dL (ref 1.7–2.4)

## 2023-10-16 MED ORDER — DEXTROSE 50 % IV SOLN: 12.5000 g | INTRAVENOUS | Status: AC

## 2023-10-16 MED ORDER — NYSTATIN 100000 UNIT/ML MT SUSP
5.0000 mL | Freq: Four times a day (QID) | OROMUCOSAL | Status: AC
  Administered 2023-10-16 – 2023-10-28 (×47): 500000 [IU] via ORAL
  Filled 2023-10-16 (×50): qty 5

## 2023-10-16 MED ORDER — DEXTROSE 50 % IV SOLN
INTRAVENOUS | Status: AC
  Administered 2023-10-16: 12.5 g via INTRAVENOUS
  Filled 2023-10-16: qty 50

## 2023-10-16 NOTE — Plan of Care (Signed)
  Problem: Skin Integrity: Goal: Risk for impaired skin integrity will decrease Outcome: Progressing   Problem: Tissue Perfusion: Goal: Adequacy of tissue perfusion will improve Outcome: Progressing   Problem: Education: Goal: Knowledge of General Education information will improve Description: Including pain rating scale, medication(s)/side effects and non-pharmacologic comfort measures Outcome: Progressing   Problem: Health Behavior/Discharge Planning: Goal: Ability to manage health-related needs will improve Outcome: Progressing   Problem: Clinical Measurements: Goal: Ability to maintain clinical measurements within normal limits will improve Outcome: Progressing Goal: Diagnostic test results will improve Outcome: Progressing

## 2023-10-16 NOTE — Progress Notes (Signed)
 Occupational Therapy Treatment Patient Details Name: Jeremiah Stevens MRN: [id number] DOB: 1976-02-14 Today's Date: 10/16/2023   History of present illness 48 yo pt admitted to [facility] 09/26/23 for difficulty swallowing. 3/19 PEA, ROSC after 2 min, then bradycardic arrest 1 min later, CPR resumed, pt intubated, and ROSC 6 min later. Seizure-like activity also noted briefly after ROSC. 3/20 pt transferred to [facility]. 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 3/21 self extubated and reintubated. 3/31 trach. PMHx: HIV/AIDS, HTN, herpes, migraine, depression, DM II, polysubstance use, and seizure.   OT comments  Patient received in supine and agreeable to OT treatment. Nursing states patient able to stand earlier today for linen change and patient agreed to use Stedy for transfer to recliner instead of maxisky. Patient able to get into long sitting without assistance and required mod assist to pivot to EOB and scoot forward. Patient stood from EOB into stedy with mod assist +2 and min assist +2 to stand in stedy before lowering to recliner with cues for hand placement. AAROM performed while up in recliner. Acute OT to continue to follow to address established goals to facilitate DC to next venue of care.       If plan is discharge home, recommend the following:  Two people to help with walking and/or transfers;Two people to help with bathing/dressing/bathroom   Equipment Recommendations  Wheelchair (measurements OT);Wheelchair cushion (measurements OT);BSC/3in1;Hospital bed    Recommendations for Other Services      Precautions / Restrictions Precautions Precautions: Fall;Other (comment) Recall of Precautions/Restrictions: Impaired Precaution/Restrictions Comments: trach to vent, cortrak, primofit- needs min cues to watch arm placement to prevent disconnecting from vent Restrictions Weight Bearing Restrictions Per Provider Order: No       Mobility Bed  Mobility Overal bed mobility: Needs Assistance Bed Mobility: Supine to Sit     Supine to sit: Mod assist     General bed mobility comments: patient long sitting in bed with LLE hanging off EOB and mod assist to pivot hips towards EOB and to scoot forward.    Transfers Overall transfer level: Needs assistance Equipment used: Ambulation equipment used Transfers: Bed to chair/wheelchair/BSC Sit to Stand: Mod assist, +2 physical assistance, From elevated surface           General transfer comment: agreeable to transfer with [facility]. Mod assist +2 to stand from EOB and min assist +2 from stedy pads, cues for hand placement Transfer via Lift Equipment: Stedy   Balance Overall balance assessment: Needs assistance Sitting-balance support: Single extremity supported, Bilateral upper extremity supported, Feet supported Sitting balance-Leahy Scale: Fair Sitting balance - Comments: supervision EOB   Standing balance support: Bilateral upper extremity supported, During functional activity Standing balance-Leahy Scale: Poor Standing balance comment: stood in stedy for transfers with mod assist +2                           ADL either performed or assessed with clinical judgement   ADL Overall ADL's : Needs assistance/impaired Eating/Feeding: NPO                   Lower Body Dressing: Maximal assistance;Sitting/lateral leans Lower Body Dressing Details (indicate cue type and reason): long sitting in bed with patient assisting with pulling up socks once donned               General ADL Comments: declined grooming tasks, focused on transfer to recliner and RUE AAROM  Extremity/Trunk Assessment              Occupational psychologist Communication: Impaired Factors Affecting Communication: Trach/intubated   Cognition Arousal: Alert Behavior During Therapy: WFL for tasks assessed/performed Cognition:  Difficult to assess     Awareness: Intellectual awareness intact, Online awareness impaired   Attention impairment (select first level of impairment): Selective attention   OT - Cognition Comments: nodding yes/no appropriately                 Following commands: Impaired Following commands impaired: Follows one step commands with increased time      Cueing   Cueing Techniques: Verbal cues, Tactile cues, Visual cues  Exercises Exercises: General Upper Extremity General Exercises - Upper Extremity Shoulder Flexion: AAROM, Right, 10 reps, Seated Shoulder ABduction: AAROM, Right, 10 reps, Seated Elbow Flexion: AAROM, Right, 10 reps, Seated Elbow Extension: AAROM, Right, 10 reps, Seated    Shoulder Instructions       General Comments      Pertinent Vitals/ Pain       Pain Assessment Pain Assessment: Faces Faces Pain Scale: No hurt Pain Intervention(s): Monitored during session  Home Living                                          Prior Functioning/Environment              Frequency  Min 2X/week        Progress Toward Goals  OT Goals(current goals can now be found in the care plan section)  Progress towards OT goals: Progressing toward goals  Acute Rehab OT Goals Patient Stated Goal: did not state OT Goal Formulation: With patient Time For Goal Achievement: 10/18/23 Potential to Achieve Goals: Good ADL Goals Pt Will Perform Lower Body Dressing: with mod assist;sit to/from stand Pt Will Transfer to Toilet: with +2 assist;with mod assist;stand pivot transfer;bedside commode Additional ADL Goal #1: pt will complete HEP R UE min (A) written  Plan      Co-evaluation                 AM-PAC OT "6 Clicks" Daily Activity     Outcome Measure   Help from another person eating meals?: A Lot Help from another person taking care of personal grooming?: A Little Help from another person toileting, which includes using toliet,  bedpan, or urinal?: Total Help from another person bathing (including washing, rinsing, drying)?: A Lot Help from another person to put on and taking off regular upper body clothing?: A Lot Help from another person to put on and taking off regular lower body clothing?: Total 6 Click Score: 11    End of Session Equipment Utilized During Treatment: Oxygen (trach/vent)  OT Visit Diagnosis: Unsteadiness on feet (R26.81);Muscle weakness (generalized) (M62.81) Symptoms and signs involving cognitive functions: Cerebral infarction   Activity Tolerance Patient tolerated treatment well   Patient Left in chair;with call bell/phone within reach;with chair alarm set   Nurse Communication Mobility status;Need for lift equipment;Precautions        Time: 1132-1201 OT Time Calculation (min): 29 min  Charges: OT General Charges $OT Visit: 1 Visit OT Treatments $Therapeutic Activity: 8-22 mins $Therapeutic Exercise: 8-22 mins  Alfonse Flavors, OTA Acute Rehabilitation Services  Office [phone]   Tiasia Weberg Jeannett Senior  10/16/2023, 12:59 PM

## 2023-10-16 NOTE — Plan of Care (Signed)
  Problem: Health Behavior/Discharge Planning: Goal: Ability to identify and utilize available resources and services will improve Outcome: Progressing Goal: Ability to manage health-related needs will improve Outcome: Progressing   Problem: Metabolic: Goal: Ability to maintain appropriate glucose levels will improve Outcome: Progressing   Problem: Nutritional: Goal: Maintenance of adequate nutrition will improve Outcome: Progressing   

## 2023-10-16 NOTE — Progress Notes (Signed)
 NAME:  Jeremiah Stevens, MRN:  [id number], DOB:  15-Aug-1975, LOS: 20 ADMISSION DATE:  09/26/2023, CONSULTATION DATE:  09/29/23 REFERRING MD:  Rennis Chris CHIEF COMPLAINT:  Hypoxia   History of Present Illness:  Pt is encephelopathic; therefore, this HPI is obtained from chart review. Jeremiah Stevens is a 48 y.o. adult who has a PMH as below including but not limited to DM2, seizures, HIV AIDS untreated, cryptococcal disease, obesity, polysubstance abuse, prior CVA. She was admitted 09/26/23 with dysphagia x 3 days along with headaches and drooling. He was found to be hypoxic to 80s on room air. CTA was negative for PE but showed multifocal PNA. He was started on abx and BD's. Biktarvy was continued and ID was consulted. 3/18 He had right facial droop, right sided weakness, dysarthria. Neurology was consulted and recommended CTA and MRI. MRI demonstrated acute right medullary infarct. Despite ongoing dysphagia (MBS showed moderately severe pharyngeal phase dysphagia and imaging showed unilateral bulging on right), pt insisted on eating. He reportedly ordered pizza overnight 3/18 and certainly aspirated. He had threatened to leave AMA despite stroke workup ongoing. 3/19, he had hypoxia for which rapid response was called. While being assess, he continued to desaturate down into the 60s and then 40s. He required BVM and was transferred to the ICU for further evaluation and management. Upon arrival in the ICU, she had ongoing desaturations and bradycardia before developing PEA arrest. He had 9 minutes ACLS prior to ROSC including epi x 4 and bicarb x 2. During intubation attempt, she had copious food material and emesis in her oropharynx. Visualization of vocal cords was difficult due to amount of food material and emesis despite suctioning. On 2nd attempt, ETT was passed successfully and saturations improved shortly thereafter. There was grand mal seizure activity noted after intubation that abated after  Midazolam administration.  Pertinent  Medical History:  HIV/AIDS, DM II, seizures, polysubstance use, CVA, cryptococcal disease.   Significant Hospital Events: Including procedures, antibiotic start and stop dates in addition to other pertinent events   3/16 admit 3/18 neuro consult, found to have acute CVA 3/19 Ongoing aspiration despite obvious dysphagia, was planning to leave AMA but sister talked pt into staying. then had a massive aspiration event leading to worse hypoxia and PEA arrest. Txf to [facility] 3/20 PSV cumulative total of 5-6 hrs, following some commands/ purposefully 3/21 self extubated, was reintubated 3/23 Failed SBT due to WOB 3/24 Failed SBT due to apnea 3/25 Tolerated PS >4 hours 3/26: Intubated. 3/31: S/p tracheostomy with ENT. 4/4: trach collar starting 1200  4/5 cont on trach collar   Interim History / Subjective:   NAEO  In good spirits this morning   Objective:  Blood pressure 125/85, pulse 91, temperature 99 F (37.2 C), temperature source Axillary, resp. rate 13, height 6' (1.829 m), weight 107.1 kg, SpO2 99%.    FiO2 (%):  [40 %-100 %] 40 %   Intake/Output Summary (Last 24 hours) at 10/16/2023 1029 Last data filed at 10/16/2023 0800 Gross per 24 hour  Intake 1545 ml  Output 500 ml  Net 1045 ml   Filed Weights   10/12/23 0500 10/13/23 0500 10/15/23 0441  Weight: 116.1 kg 107.2 kg 107.1 kg   Physical Exam: General: middle aged adult reclined in bed NAD  HENT: NCAT. 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, R PICA territory infarct  P -PT/OT/SLP -ASA, statin   Acute encephalopathy Anxiety, depression  P -minimize CNS depressing meds -delirium precautions   PEA arrest  -?Sz like activity following  P -supportive care   HFpEF Hx HTN HLD -statin, dilt, metop PRN hydral and PRN labetalol   DM2, with hyperglycemia, on steroids -cont basal + SSI  Normocytic anemia -Follow intermittent CBC  HIV / AIDS Hx +cryptococcal antigen  P -Tivicay and Truvada (affordable for LTAC) -follow s/sx IRIS given sub optimal compliant with HAART outpatient -cont fluc -cont PJP prophylaxis ( initiated 3/29, PCR negative)  Code status - iPAL note 3/19 - DNR status - 3/21 patient self extubated and family reversed to full code - 3/25. Patient and sister agreed to plan for extubation and re-intubation. Would want trach if indicated in the future  Dispo: -in immediate sense, if on ATC x 24hr will txf out of ICU -big picture, would benefit from [facility] practice (evaluated daily):  Diet/type: Tube feeds.  DVT prophylaxis: Heparin  Pressure ulcer(s): pressure ulcer assessment deferred  GI prophylaxis: PPI Lines: N/A Foley:  N/A Code Status:  full code Last date of multidisciplinary goals of care discussion: 4/5 pt updated    CCT na  Tessie Fass MSN, AGACNP-BC [facility] Pulmonary/Critical Care Medicine Amion for pager 10/16/2023, 10:29 AM

## 2023-10-17 DIAGNOSIS — J189 Pneumonia, unspecified organism: Secondary | ICD-10-CM | POA: Diagnosis not present

## 2023-10-17 DIAGNOSIS — B20 Human immunodeficiency virus [HIV] disease: Secondary | ICD-10-CM | POA: Diagnosis not present

## 2023-10-17 DIAGNOSIS — I639 Cerebral infarction, unspecified: Secondary | ICD-10-CM | POA: Diagnosis not present

## 2023-10-17 DIAGNOSIS — J9601 Acute respiratory failure with hypoxia: Secondary | ICD-10-CM | POA: Diagnosis not present

## 2023-10-17 LAB — GLUCOSE, CAPILLARY
Glucose-Capillary: 102 mg/dL — ABNORMAL HIGH (ref 70–99)
Glucose-Capillary: 138 mg/dL — ABNORMAL HIGH (ref 70–99)
Glucose-Capillary: 145 mg/dL — ABNORMAL HIGH (ref 70–99)
Glucose-Capillary: 174 mg/dL — ABNORMAL HIGH (ref 70–99)
Glucose-Capillary: 66 mg/dL — ABNORMAL LOW (ref 70–99)
Glucose-Capillary: 72 mg/dL (ref 70–99)
Glucose-Capillary: 79 mg/dL (ref 70–99)
Glucose-Capillary: 84 mg/dL (ref 70–99)

## 2023-10-17 MED ORDER — FUROSEMIDE 10 MG/ML IJ SOLN
40.0000 mg | Freq: Once | INTRAMUSCULAR | Status: AC
  Administered 2023-10-17: 40 mg via INTRAVENOUS
  Filled 2023-10-17: qty 4

## 2023-10-17 MED ORDER — INSULIN ASPART 100 UNIT/ML IJ SOLN
10.0000 [IU] | INTRAMUSCULAR | Status: DC
  Administered 2023-10-18 – 2023-10-24 (×12): 10 [IU] via SUBCUTANEOUS

## 2023-10-17 MED ORDER — QUETIAPINE FUMARATE 50 MG PO TABS
50.0000 mg | ORAL_TABLET | Freq: Two times a day (BID) | ORAL | Status: DC
  Administered 2023-10-17 – 2023-10-28 (×23): 50 mg
  Filled 2023-10-17 (×16): qty 1
  Filled 2023-10-17: qty 2
  Filled 2023-10-17 (×6): qty 1

## 2023-10-17 MED ORDER — GLYCOPYRROLATE 0.2 MG/ML IJ SOLN
0.1000 mg | Freq: Four times a day (QID) | INTRAMUSCULAR | Status: AC
  Administered 2023-10-17 – 2023-10-19 (×8): 0.1 mg via INTRAVENOUS
  Filled 2023-10-17 (×8): qty 1

## 2023-10-17 MED ORDER — MELATONIN 5 MG PO TABS
5.0000 mg | ORAL_TABLET | Freq: Every evening | ORAL | Status: DC | PRN
  Administered 2023-10-17: 5 mg via ORAL
  Filled 2023-10-17: qty 1

## 2023-10-17 NOTE — Progress Notes (Addendum)
 eLink Physician-Brief Progress Note Patient Name: Jeremiah Stevens DOB: 1976-04-16 MRN: [id number]   Date of Service  10/17/2023  HPI/Events of Note  Patient with respiratory failure with anticipated LTAC placement has progressive orders but has significant secretions through deep oral and tracheal secretions  eICU Interventions  Continue aspiration as possible,add Robinul   2157 -requesting something for anxiety/insomnia.  Has Seroquel ordered twice daily and hydroxyzine as needed.  Added melatonin as needed for nighttime  Intervention Category Minor Interventions: Routine modifications to care plan (e.g. PRN medications for pain, fever)  Bethanny Toelle 10/17/2023, 8:00 PM

## 2023-10-17 NOTE — Progress Notes (Signed)
 During last 7p shift (10/16/23), pt attempted to obtain drinks from multiple staff members throughout the shift despite being educated three times about her tests showing she is aspirating.  Each time, a staff member approached this nurse to ask if pt could have fluids and/or ice chips.  Pt continues to be non-compliant.  Pt initially refused 2200 medications, saying the medications cause her nausea.  Pt has Dobhoff tube which is post-pyloric and not in the stomach.  Pt then was found to have disconnected her tube feeding and threw the tube to the ground.  This nurse was not notified by the pt that she had disconnected her nutrition.  Pt then complained of feeling "shaky."  Glucose checked and was 72.  Tube feeds reconnected and pt educated that she cannot remove this as it may results in a significant drop in her glucose.  Pt verbalized understanding.

## 2023-10-17 NOTE — Progress Notes (Addendum)
 NAME:  Jeremiah Stevens, MRN:  [id number], DOB:  March 14, 1976, LOS: 21 ADMISSION DATE:  09/26/2023, CONSULTATION DATE:  09/29/23 REFERRING MD:  Rennis Chris CHIEF COMPLAINT:  Hypoxia   History of Present Illness:  Pt is encephelopathic; therefore, this HPI is obtained from chart review. Jeremiah Stevens is a 48 y.o. adult who has a PMH as below including but not limited to DM2, seizures, HIV AIDS untreated, cryptococcal disease, obesity, polysubstance abuse, prior CVA. She was admitted 09/26/23 with dysphagia x 3 days along with headaches and drooling. He was found to be hypoxic to 80s on room air. CTA was negative for PE but showed multifocal PNA. He was started on abx and BD's. Biktarvy was continued and ID was consulted. 3/18 He had right facial droop, right sided weakness, dysarthria. Neurology was consulted and recommended CTA and MRI. MRI demonstrated acute right medullary infarct. Despite ongoing dysphagia (MBS showed moderately severe pharyngeal phase dysphagia and imaging showed unilateral bulging on right), pt insisted on eating. He reportedly ordered pizza overnight 3/18 and certainly aspirated. He had threatened to leave AMA despite stroke workup ongoing. 3/19, he had hypoxia for which rapid response was called. While being assess, he continued to desaturate down into the 60s and then 40s. He required BVM and was transferred to the ICU for further evaluation and management. Upon arrival in the ICU, she had ongoing desaturations and bradycardia before developing PEA arrest. He had 9 minutes ACLS prior to ROSC including epi x 4 and bicarb x 2. During intubation attempt, she had copious food material and emesis in her oropharynx. Visualization of vocal cords was difficult due to amount of food material and emesis despite suctioning. On 2nd attempt, ETT was passed successfully and saturations improved shortly thereafter. There was grand mal seizure activity noted after intubation that abated after  Midazolam administration.  Pertinent  Medical History:  HIV/AIDS, DM II, seizures, polysubstance use, CVA, cryptococcal disease.  Significant Hospital Events: Including procedures, antibiotic start and stop dates in addition to other pertinent events   3/16 admit 3/18 neuro consult, found to have acute CVA 3/19 Ongoing aspiration despite obvious dysphagia, was planning to leave AMA but sister talked pt into staying. then had a massive aspiration event leading to worse hypoxia and PEA arrest. Txf to [facility] 3/20 PSV cumulative total of 5-6 hrs, following some commands/ purposefully 3/21 self extubated, was reintubated 3/23 Failed SBT due to WOB 3/24 Failed SBT due to apnea 3/25 Tolerated PS >4 hours 3/26: Intubated. 3/31: S/p tracheostomy with ENT. 4/4: trach collar starting 1200  4/5 cont on trach collar   Interim History / Subjective:  OV, increased FiO2 requirement,  transfer out of the ICU deferred  This morning, awake and alert, on trach collar. Objective:  Blood pressure 136/65, pulse 86, temperature 98.2 F (36.8 C), temperature source Oral, resp. rate (!) 29, height 6' (1.829 m), weight 107.1 kg, SpO2 98%.    FiO2 (%):  [28 %-50 %] 35 %   Intake/Output Summary (Last 24 hours) at 10/17/2023 0920 Last data filed at 10/17/2023 0100 Gross per 24 hour  Intake 710.07 ml  Output 425 ml  Net 285.07 ml   Filed Weights   10/12/23 0500 10/13/23 0500 10/15/23 0441  Weight: 116.1 kg 107.2 kg 107.1 kg   Physical Exam: General: middle aged adult reclined in bed NAD  HENT: NCAT. Cortrak in L nares with thick clear secretions, Trach midline, secure  Respiratory: Upper rhonchi  Cardiovascular: rrr GI: soft ndnt  Extremities:  no acute joint deformity  Neuro: awake, following commands GU: external cath   Labs/imaging personally reviewed:  BMP CBC w hgb 8.1  Assessment & Plan:   Acute hypoxic resp failure S/p trach Multifocal PNA Aspiration event  Hx tobacco  use Dysphagia Tolerated trach collar trial off the vent, with mildly increased FiO2 requirement 25-40%  .  Will do a one-time dose of IV Lasix trial to improve the respiratory status.   He is medically stable to leave the ICU to medical floor.  -GPC on trach asp 4/1, on cefepime #6/7 -IV lasix 40 mg. 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, R PICA territory infarct  P -PT/OT/SLP -ASA, statin   Acute encephalopathy Anxiety, depression  P -minimize CNS depressing meds -delirium precautions   PEA arrest  -?Sz like activity following  P -supportive care   HFpEF Hx HTN HLD -statin, dilt, metop PRN hydral and PRN labetalol   DM2, with hyperglycemia, on steroids -cont basal + SSI  Normocytic anemia -Follow intermittent CBC  HIV / AIDS Hx +cryptococcal antigen  P -Tivicay and Truvada (affordable for LTAC) -follow s/sx IRIS given sub optimal compliant with HAART outpatient -cont fluc -cont PJP prophylaxis ( initiated 3/29, PCR negative)  Code status - iPAL note 3/19 - DNR status - 3/21 patient self extubated and family reversed to full code - 3/25. Patient and sister agreed to plan for extubation and re-intubation. Would want trach if indicated in the future  Dispo: -in immediate sense, if on ATC x 24hr will txf out of ICU -big picture, would benefit from [facility] practice (evaluated daily):  Diet/type: Tube feeds.  DVT prophylaxis: Heparin  Pressure ulcer(s): pressure ulcer assessment deferred  GI prophylaxis: PPI Lines: N/A Foley:  N/A Code Status:  full code Last date of multidisciplinary goals of care discussion: 4/5 pt updated   Laretta Bolster, M.D.  Internal Medicine Resident, PGY-1 [facility]  Pager: [phone] 9:20 AM, 10/17/2023

## 2023-10-17 NOTE — Evaluation (Signed)
 [facility]y-[facility] Speaking Valve - Evaluation Patient Details  Name: Jeremiah Stevens MRN: [id number] Date of Birth: October 12, 1975  Today's Date: 10/17/2023 Time: 1000-1023 SLP Time Calculation (min) (ACUTE ONLY): 23 min  Past Medical History:  Past Medical History:  Diagnosis Date   Cancer (HCC)    seizures   Diabetes (HCC)    Diabetes mellitus without complication (HCC)    Heartburn    occasional; OTC as needed   Herpes genitalis in men    HIV (human immunodeficiency virus infection) (HCC)    HIV disease (HCC)    HTN (hypertension)    Hyperlipidemia    Hypertension    under control with med., has been on med. x 1 yr.   Lateral malleolar fracture 09/02/2013   left   Migraines    Tear of deltoid ligament of left ankle 09/02/2013   Type 2 diabetes mellitus with hyperosmolar nonketotic hyperglycemia (HCC) 02/10/2020   Past Surgical History:  Past Surgical History:  Procedure Laterality Date   NO PAST SURGERIES     ORIF ANKLE FRACTURE Left 09/13/2013   Procedure: OPEN REDUCTION INTERNAL FIXATION (ORIF) LEFT LATERAL MALLEOLUS ANKLE FRACTURE ;  Surgeon: Dannielle Huh, MD;  Location: [facility];  Service: Orthopedics;  Laterality: Left;   TRACHEOSTOMY TUBE PLACEMENT N/A 10/11/2023   Procedure: CREATION, TRACHEOSTOMY;  Surgeon: Serena Colonel, MD;  Location: [facility] OR;  Service: ENT;  Laterality: N/A;   HPI:  Jeremiah Stevens is a 48 y.o. adult who identifies as male, goes by Jeremiah Stevens, admitted 09/26/23 with dysphagia x 3 days along with headaches and drooling. She was found to be hypoxic to 80s on room air. CTA showed multifocal PNA.  3/18 she had right facial droop, right sided weakness, dysarthria. MRI demonstrated acute right medullary infarct. Despite ongoing dysphagia (MBS showed moderately severe pharyngeal phase dysphagia and imaging showed unilateral bulging on right), pt insisted on eating. She reportedly ordered pizza overnight 3/18 and certainly aspirated, desaturated,  ultimately suffered PEA arrest, 9 minutes ACLS, copious food material and emesis in oropharynx. Self extubated 3/21, reintubated and extubated 3/26, Trach on 3/31, ATC on 4/4.    Assessment / Plan / Recommendation  Clinical Impression  Pt tolerated PMSV placement on 8 trach with cuff deflated. O2 sats consistently improved from baseline, staying at 100 during trial. Pt able to orally expectorate secretions, noted to have recurrent wet vocal quality. No back pressure on removal of trach. Pt able to phonate, though vocal quality breathy and breath support decreased. Pt improved with cues, but tended to ramble without awareness that she was unintelligible or aphonic at times. Pt admits she often falls asleep when she is alone, so although she could toelrate PMSV all waking hours, she will need valve placed when a staff member or visitor is present to remain alert. Will f/u for further training and to resume dysphagia therapy when appropriate.      SLP Assessment       Recommendations for follow up therapy are one component of a multi-disciplinary discharge planning process, led by the attending physician.  Recommendations may be updated based on patient status, additional functional criteria and insurance authorization.  Follow Up Recommendations  Skilled nursing-short term rehab (<3 hours/day)    Assistance Recommended at Discharge Frequent or constant Supervision/Assistance  Functional Status Assessment Patient has had a recent decline in their functional status and demonstrates the ability to make significant improvements in function in a reasonable and predictable amount of time.  Frequency and Duration min  2x/week  2 weeks    PMSV Trial PMSV was placed for: 20 minutes Able to redirect subglottic air through upper airway: Yes Able to Attain Phonation: Yes Voice Quality: Breathy;Low vocal intensity Able to Expectorate Secretions: Yes Level of Secretion Expectoration with PMSV:  Oral Breath Support for Phonation: Moderately decreased Intelligibility: Intelligibility reduced Word: 50-74% accurate Phrase: 50-74% accurate Sentence: 50-74% accurate Conversation: 25-49% accurate Respirations During Trial: 20 SpO2 During Trial: 100 %   Tracheostomy Tube  Additional Tracheostomy Tube Assessment Fenestrated: No Trach Collar Period: all waking hours Secretion Description: thin clear    Vent Dependency  Vent Dependent: No FiO2 (%): 35 %    Cuff Deflation Trial Tolerated Cuff Deflation: Yes Length of Time for Cuff Deflation Trial: baseline         Jeremiah Stevens, Jeremiah Stevens 10/17/2023, 11:43 AM

## 2023-10-17 NOTE — Progress Notes (Signed)
 eLink Physician-Brief Progress Note Patient Name: Jeremiah Stevens DOB: 1975/08/20 MRN: [id number]   Date of Service  10/17/2023  HPI/Events of Note  Transfer from ICU canceled for increasing oxygen needs  eICU Interventions       Intervention Category Intermediate Interventions: Other:  Henry Russel, P 10/17/2023, 1:15 AM

## 2023-10-17 NOTE — Plan of Care (Signed)
  Problem: Health Behavior/Discharge Planning: Goal: Ability to identify and utilize available resources and services will improve Outcome: Progressing Goal: Ability to manage health-related needs will improve Outcome: Progressing   Problem: Metabolic: Goal: Ability to maintain appropriate glucose levels will improve Outcome: Progressing   Problem: Nutritional: Goal: Maintenance of adequate nutrition will improve Outcome: Progressing   Problem: Education: Goal: Knowledge of General Education information will improve Description: Including pain rating scale, medication(s)/side effects and non-pharmacologic comfort measures Outcome: Progressing   Problem: Health Behavior/Discharge Planning: Goal: Ability to manage health-related needs will improve Outcome: Progressing   Problem: Nutritional: Goal: Progress toward achieving an optimal weight will improve Outcome: Not Progressing

## 2023-10-18 ENCOUNTER — Inpatient Hospital Stay (HOSPITAL_COMMUNITY)

## 2023-10-18 DIAGNOSIS — J9601 Acute respiratory failure with hypoxia: Secondary | ICD-10-CM | POA: Diagnosis not present

## 2023-10-18 DIAGNOSIS — J690 Pneumonitis due to inhalation of food and vomit: Secondary | ICD-10-CM | POA: Diagnosis not present

## 2023-10-18 DIAGNOSIS — R1312 Dysphagia, oropharyngeal phase: Secondary | ICD-10-CM | POA: Diagnosis not present

## 2023-10-18 LAB — CBC
HCT: 28.2 % — ABNORMAL LOW (ref 39.0–52.0)
Hemoglobin: 9.1 g/dL — ABNORMAL LOW (ref 13.0–17.0)
MCH: 30 pg (ref 26.0–34.0)
MCHC: 32.3 g/dL (ref 30.0–36.0)
MCV: 93.1 fL (ref 80.0–100.0)
Platelets: 299 10*3/uL (ref 150–400)
RBC: 3.03 MIL/uL — ABNORMAL LOW (ref 4.22–5.81)
RDW: 15 % (ref 11.5–15.5)
WBC: 5.2 10*3/uL (ref 4.0–10.5)
nRBC: 0 % (ref 0.0–0.2)

## 2023-10-18 LAB — GLUCOSE, CAPILLARY
Glucose-Capillary: 120 mg/dL — ABNORMAL HIGH (ref 70–99)
Glucose-Capillary: 106 mg/dL — ABNORMAL HIGH (ref 70–99)
Glucose-Capillary: 106 mg/dL — ABNORMAL HIGH (ref 70–99)
Glucose-Capillary: 141 mg/dL — ABNORMAL HIGH (ref 70–99)
Glucose-Capillary: 98 mg/dL (ref 70–99)
Glucose-Capillary: 101 mg/dL — ABNORMAL HIGH (ref 70–99)
Glucose-Capillary: 141 mg/dL — ABNORMAL HIGH (ref 70–99)

## 2023-10-18 LAB — CULTURE, BLOOD (ROUTINE X 2)
Culture: NO GROWTH
Culture: NO GROWTH
Special Requests: ADEQUATE
Special Requests: ADEQUATE

## 2023-10-18 LAB — BASIC METABOLIC PANEL WITH GFR
Anion gap: 7 (ref 5–15)
BUN: 21 mg/dL — ABNORMAL HIGH (ref 6–20)
CO2: 28 mmol/L (ref 22–32)
Calcium: 8.4 mg/dL — ABNORMAL LOW (ref 8.9–10.3)
Chloride: 101 mmol/L (ref 98–111)
Creatinine, Ser: 1.08 mg/dL (ref 0.61–1.24)
GFR, Estimated: >60 mL/min (ref >60)
Glucose, Bld: 145 mg/dL — ABNORMAL HIGH (ref 70–99)
Potassium: 4.4 mmol/L (ref 3.5–5.1)
Sodium: 136 mmol/L (ref 135–145)

## 2023-10-18 NOTE — TOC Progression Note (Addendum)
 Transition of Care [facility]) - Progression Note    Patient Details  Name: Jeremiah Stevens MRN: [id number] Date of Birth: 08-14-1975  [facility] CM/SW Contact  Marliss Coots, LCSW Phone Number: 10/18/2023, 2:22 PM  Clinical Narrative:     2:22 PM Per [facility] Liaison, patient's insurance will not make a contract for [facility] and is no longer able to admit to [facility]. Patient is not a candidate for IP Rehab due to trach and vent care needs. Patient is eligible to admit to SNF upon bed availability and 30 day trach requirement (day 8 as of today). CSW will continue to follow.  2:33 PM CSW contacted 2/3 SNFs in [place] that accept patients that are vented with a trach [facility] [place] and [facility]) regarding insurance. CSW awaiting response from both facilities.  2:37 PM CSW contacted [facility] who confirmed they are out of network with patient's primary insurance and would need an out of [employer] authorization and financial approval from their regional team. [facility] SNF informed CSW that facility only accepts permanent vents and could send referral for waitlist consideration as they do not have trach beds at this time.  3:36 PM CSW relayed above information to patient at bedside. CSW informed patient of potential SNF [place], which patient expressed understanding of.  Expected Discharge Plan: Skilled Nursing Facility Barriers to Discharge: Continued Medical Work up, SNF Pending bed offer  Expected Discharge Plan and Services In-house Referral: Clinical Social Work   Post Acute Care Choice: Skilled Nursing Facility Living arrangements for the past 2 months: Single Family Home                 DME Arranged: N/A DME Agency: NA                   Social Determinants of Health (SDOH) Interventions SDOH Screenings   Food Insecurity: No Food Insecurity (09/27/2023)  Housing: Low Risk  (09/27/2023)  Transportation Needs: Unmet  Transportation Needs (09/27/2023)  Utilities: Not At Risk (09/27/2023)  Depression (PHQ2-9): Low Risk  (05/22/2020)  Tobacco Use: High Risk (10/11/2023)    Readmission Risk Interventions     No data to display

## 2023-10-18 NOTE — Evaluation (Signed)
 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) 10/05/2023   Aspiration pneumonia of both lungs due to vomit (HCC) 09/29/2023   Cardiac arrest, cause unspecified (HCC) 09/29/2023   Cerebrovascular accident (CVA) (HCC) 09/29/2023   Seizures (HCC) 09/29/2023   CAP (community acquired pneumonia) 09/26/2023   Globus sensation 09/26/2023   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   Seizure (HCC) 08/26/2023   Acute hypoxic respiratory failure (HCC) 08/26/2023   Acute encephalopathy 08/26/2023   Hyperammonemia (HCC) 08/26/2023   Pancytopenia (HCC) 08/26/2023   Hyperosmolar hyperglycemic state (HHS) (HCC) 08/25/2023   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   HIV infection (HCC) 08/03/2022   RLL pneumonia 08/02/2022   AKI (acute kidney injury) (HCC) 08/02/2022   Uncontrolled type 2 diabetes mellitus with hyperglycemia, without long-term current use of insulin (HCC) 08/02/2022   Tobacco abuse 08/02/2022   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   Non-suicidal depressed mood 07/01/2018   Migraine 08/02/2013   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 x 1 yr.   Lateral malleolar fracture 09/02/2013   left   Migraines    Tear of deltoid ligament of left ankle 09/02/2013   Type 2 diabetes mellitus with hyperosmolar nonketotic hyperglycemia (HCC) 02/10/2020   Past Surgical History:  Past Surgical History:  Procedure Laterality Date   NO PAST SURGERIES     ORIF ANKLE FRACTURE Left 09/13/2013   Procedure: OPEN REDUCTION INTERNAL FIXATION (ORIF) LEFT LATERAL MALLEOLUS ANKLE FRACTURE ;  Surgeon: Dannielle Huh, MD;  Location: [facility];  Service: Orthopedics;  Laterality: Left;   TRACHEOSTOMY TUBE PLACEMENT N/A 10/11/2023   Procedure: CREATION, TRACHEOSTOMY;  Surgeon: Serena Colonel, MD;  Location: [facility] OR;  Service: ENT;  Laterality: N/A;   HPI:  Jeremiah Stevens is a 48 y.o. adult who identifies as male, admitted 09/26/23 with dysphagia x 3 days along with headaches and drooling. She was found to be hypoxic to 80s on room air. CTA showed multifocal PNA.  3/18 she had right facial droop, right sided weakness, dysarthria. MRI demonstrated acute right medullary infarct. Despite ongoing dysphagia (MBS showed moderately severe pharyngeal phase dysphagia and imaging showed unilateral bulging on right), pt insisted on eating. She reportedly ordered pizza overnight 3/18 and certainly aspirated, desaturated, ultimately suffered PEA arrest, 9 minutes ACLS, copious food material and emesis in oropharynx. Self extubated 3/21, reintubated and extubated 3/26, Trach on 3/31, ATC on 4/4.   Assessment / Plan / Recommendation Clinical Impression  Pt was seen for cognitive-linguistic eval to determine need for intervention. Pt presents with mild-moderate cognitive-communication deficits primarily characterized by  memory impairment.   The Cognistat was administered, resulting in pt presenting with impairments in the domains of attention, spatial construction, and memory. Pt's language was [facility]. Pt's focused attention  appeared [facility]. Pt exhibited difficulties with sustained attention and organization during a digit recall task; pt would provide correct numbers in a slightly different order. Pt was quick with immediate recall task, but displayed short-term memory issues with delayed recall of words. Pt only recalled one word without cues. She required categorical cues for 1 words and multiple choice for one word. The last word was not recalled, even with max cueing. Judgement during reasoning tasks appears [facility]; however, memory impairment impacts safety for pt. SLP assessed memory and reasoning further through educating pt about current plans for her swallowing and information about her tube feeding. When asked to recall, pt provided a simplified explanation and required verbal cueing to recall educational information.   Recommend SLP f/u to address functional memory strategies to optimize pt's short-term memory for safety.    SLP Assessment  SLP Visit Diagnosis: Cognitive communication deficit (R41.841)    Recommendations for follow up therapy are one component of a multi-disciplinary discharge planning process, led by the attending physician.  Recommendations may be updated based on patient status, additional functional criteria and insurance authorization.    Follow Up Recommendations       Assistance Recommended at Discharge     Functional Status Assessment    Frequency and Duration           SLP Evaluation Cognition  Overall Cognitive Status: Impaired/Different from baseline Arousal/Alertness: Awake/alert Orientation Level: Oriented X4 Year: 2025 Month: April Day of Week: Correct Attention: Focused;Sustained Focused Attention: Appears intact Sustained Attention: Impaired Sustained Attention Impairment: Verbal basic Memory: Impaired Memory Impairment: Decreased recall of new information;Decreased short term memory Awareness: Appears intact Executive Function: Landscape architect:  Impaired Organizing Impairment: Functional complex Safety/Judgment: Impaired       Comprehension  Auditory Comprehension Overall Auditory Comprehension: Appears within functional limits for tasks assessed Visual Recognition/Discrimination Discrimination: Not tested Reading Comprehension Reading Status: Not tested    Expression Expression Primary Mode of Expression: Verbal Verbal Expression Overall Verbal Expression: Appears within functional limits for tasks assessed Written Expression Written Expression: Not tested   Oral / Motor  Motor Speech Overall Motor Speech: Impaired Respiration: Within functional limits Phonation: Other (comment);Hoarse (PMSV) Intelligibility: Intelligibility reduced Conversation: 50-74% accurate Motor Planning: Not tested Motor Speech Errors: Not applicable            Rowe Robert 10/18/2023, 12:00 PM

## 2023-10-18 NOTE — Progress Notes (Signed)
 PROGRESS NOTE   Jeremiah Stevens  BJY:[id number]    DOB: 11-Feb-1976    DOA: 09/26/2023  PCP: Judyann Munson, MD   I have briefly reviewed patients previous medical records in [facility].  Chief Complaint  Patient presents with   Dysphagia    Brief Hospital Course:  48 y.o. adult who with medical history including but not limited to DM2, seizures, HIV AIDS untreated, cryptococcal disease, obesity, polysubstance abuse, prior CVA. She was admitted 09/26/23 with dysphagia x 3 days along with headaches and drooling. He was found to be hypoxic to 80s on room air. CTA was negative for PE but showed multifocal PNA. He was started on abx and BD's. Biktarvy was continued and ID was consulted. 3/18 He had right facial droop, right sided weakness, dysarthria. Neurology was consulted and recommended CTA and MRI. MRI demonstrated acute right medullary infarct. Despite ongoing dysphagia (MBS showed moderately severe pharyngeal phase dysphagia and imaging showed unilateral bulging on right), pt insisted on eating. He reportedly ordered pizza overnight 3/18 and certainly aspirated. He had threatened to leave AMA despite stroke workup ongoing. 3/19, he had hypoxia for which rapid response was called. While being assess, he continued to desaturate down into the 60s and then 40s. He required BVM and was transferred to the ICU for further evaluation and management. Upon arrival in the ICU, she had ongoing desaturations and bradycardia before developing PEA arrest. He had 9 minutes ACLS prior to ROSC including epi x 4 and bicarb x 2. During intubation attempt, she had copious food material and emesis in her oropharynx. Visualization of vocal cords was difficult due to amount of food material and emesis despite suctioning. On 2nd attempt, ETT was passed successfully and saturations improved shortly thereafter. There was grand mal seizure activity noted after intubation that abated after Midazolam  administration.  Significant hospital events:  3/16 admit 3/18 neuro consult, found to have acute CVA 3/19 Ongoing aspiration despite obvious dysphagia, was planning to leave AMA but sister talked pt into staying. then had a massive aspiration event leading to worse hypoxia and PEA arrest. Txf to [facility] 3/20 PSV cumulative total of 5-6 hrs, following some commands/ purposefully 3/21 self extubated, was reintubated 3/23 Failed SBT due to WOB 3/24 Failed SBT due to apnea 3/25 Tolerated PS >4 hours 3/26: Intubated. 3/31: S/p tracheostomy with ENT. 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 S/P a one-time dose of IV Lasix trial to improve the respiratory status on 4/6.   Currently without tachypnea, saturating in the high 90s on  trach collar FiO2 35%, 10 L/min.  Discussed with RN at bedside to attempt to wean. Tracheostomy management per CCM.   -GPC on trach asp 4/1, on cefepime #7/7 -SLP consult for PMV eval.  Patient using PMV this morning. 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  Recommending LTAC. -ASA, statin    Acute encephalopathy Anxiety, depression  P -minimize CNS depressing meds -delirium precautions  -Meeting her for the first time but appears alert and oriented, coherent.  Encephalopathy seems to have resolved.   PEA arrest  -?Sz like activity following  P -supportive care    HFpEF Hx HTN HLD -statin, dilt, metop PRN hydral and PRN labetalol  Blood pressure is controlled.   DM2, with hyperglycemia, on steroids -cont basal + SSI.  Good control on current regimen. A1c 10.8.   Normocytic anemia -Follow intermittent CBC.  Hemoglobin stable in the 8-9 g range.   HIV / AIDS Hx +cryptococcal antigen  P -Tivicay and Truvada (affordable for LTAC) -follow s/sx IRIS given sub optimal compliant with HAART outpatient -cont fluc -cont PJP prophylaxis ( initiated 3/29, PCR negative) -Outpatient follow-up with ID.   Code status - iPAL note 3/19 - DNR status - 3/21 patient self extubated and family reversed to full code - 3/25. Patient and sister agreed to plan for extubation and re-intubation. Would want trach if indicated in the future  Medical noncompliance. As per chart review and detailed discussion with overnight ICU RN, patient has medical decision making capacity but is very noncompliant-pulled out feeding tube, attempts to pull out tracheostomy, asked multiple people if he/she can have something to eat or drink. Difficult situation.    Body mass index is 33.97 kg/m.  Obesity Complicates care.  Nutritional Status Nutrition Problem: Increased nutrient needs Etiology: chronic illness Signs/Symptoms: estimated needs Interventions: Tube feeding, Prostat   DVT prophylaxis: SCDs Start: 09/27/23 0018.  Lovenox   Code Status: Full Code:  Family Communication: None at bedside Disposition:  Status is:  Inpatient Remains inpatient appropriate because: Ongoing need for tube feeds via Dobbhoff tube.  Will need to continue SLP evaluation and if unable to tolerate p.o., will need to consider PEG/gastrostomy feeding tube placement.  However despite that tube placement if she continues to eat then remains at very high risk for recurrent aspiration event with related complications including death. LTAC recommended by therapies.  TOC to follow.     Consultants:   ENT Psychiatry PCCM Neurology Infectious disease  Procedures:   As noted above  Antimicrobials:   As noted above   Subjective:  Patient seen in the ICU with RN at bedside.  Limited speech due to tracheostomy and PMV.  Specifically denies complaints including dyspnea or pain.  Objective:   Vitals:   10/18/23 0500 10/18/23 0600 10/18/23 0711 10/18/23 0756  BP: 115/77 139/87  118/72  Pulse: 92 78  92  Resp: (!) 24 20  20   Temp:   98.6 F (37 C)   TempSrc:   Oral   SpO2: 97% 99%  99%  Weight: 113.6 kg     Height:        General exam: Young adult, moderately built and obese sitting up comfortably in bed without distress. Neck: Tracheostomy with trach collar ENT: Has Dobbhoff feeding tube. Respiratory system: Clear to auscultation. Respiratory effort normal. Cardiovascular system: S1 & S2 heard, RRR. No JVD, murmurs, rubs, gallops  or clicks. No pedal edema.  Telemetry personally reviewed: Sinus rhythm. Gastrointestinal system: Abdomen is nondistended/obese, soft and nontender. No organomegaly or masses felt. Normal bowel sounds heard. Central nervous system: Alert and oriented. No focal neurological deficits. Extremities: Symmetric 5 x 5 power. Skin: No rashes, lesions or ulcers Psychiatry: Judgement and insight appear normal. Mood & affect appropriate.     Data Reviewed:   I have personally reviewed following labs and imaging studies   CBC: Recent Labs  Lab 10/15/23 0316 10/16/23 0212 10/18/23 0223  WBC 4.3  4.3 5.2  HGB 8.7* 8.1* 9.1*  HCT 28.9* 26.0* 28.2*  MCV 96.0 94.9 93.1  PLT 223 249 299    Basic Metabolic Panel: Recent Labs  Lab 10/11/23 1030 10/12/23 0632 10/13/23 0025 10/14/23 0314 10/15/23 0316 10/16/23 0212 10/18/23 0223  NA 144   < > 138 137 141 139 136  K 4.9   < > 4.8 4.6 4.4 4.3 4.4  CL 102   < > 98 97* 100 103 101  CO2 33*   < > 32 31 35* 31 28  GLUCOSE 64*   < > 217* 227* 195* 175* 145*  BUN 33*   < > 28* 29* 27* 22* 21*  CREATININE 1.14   < > 1.07 1.13 1.27* 0.98 1.08  CALCIUM 9.6   < > 8.7* 8.2* 8.3* 8.1* 8.4*  MG 2.1  --  2.2 2.4 2.6* 2.4  --   PHOS 3.7  --   --   --   --   --   --    < > = values in this interval not displayed.    Liver Function Tests: Recent Labs  Lab 10/16/23 1054  AST 19  ALT 29  ALKPHOS 78  BILITOT 0.4  PROT 7.1  ALBUMIN 2.2*    CBG: Recent Labs  Lab 10/18/23 0030 10/18/23 0340 10/18/23 0716  GLUCAP 141* 120* 106*    Microbiology Studies:   Recent Results (from the past 240 hours)  Culture, Respiratory w Gram Stain     Status: None   Collection Time: 10/12/23  4:41 PM   Specimen: Tracheal Aspirate; Respiratory  Result Value Ref Range Status   Specimen Description TRACHEAL ASPIRATE  Final   Special Requests NONE  Final   Gram Stain   Final    ABUNDANT WBC PRESENT, PREDOMINANTLY PMN RARE GRAM POSITIVE COCCI IN PAIRS    Culture   Final    FEW Normal respiratory flora-no Staph aureus or Pseudomonas seen Performed at [facility], [address], [place], [place] [address]    Report Status 10/14/2023 FINAL  Final  Culture, blood (Routine X 2) w Reflex to ID Panel     Status: None   Collection Time: 10/13/23 12:24 AM   Specimen: BLOOD LEFT HAND  Result Value Ref Range Status   Specimen Description BLOOD LEFT HAND  Final   Special Requests   Final    BOTTLES DRAWN AEROBIC AND ANAEROBIC Blood Culture adequate volume   Culture   Final    NO GROWTH 5 DAYS Performed at [facility], [address], [place], [place] [address]    Report Status 10/18/2023 FINAL  Final  Culture, blood (Routine X 2) w Reflex to ID Panel     Status: None   Collection Time: 10/13/23 12:26 AM   Specimen: BLOOD LEFT ARM  Result Value Ref Range Status   Specimen Description BLOOD LEFT ARM  Final  Special Requests   Final    BOTTLES DRAWN AEROBIC AND ANAEROBIC Blood Culture adequate volume   Culture   Final    NO GROWTH 5 DAYS Performed at [facility], [address] [place], [place] [address]    Report Status 10/18/2023 FINAL  Final    Radiology Studies:  No results found.  Scheduled Meds:    aspirin  81 mg Per Tube Daily   atorvastatin  20 mg Per Tube QHS   atovaquone  1,500 mg Per Tube Q lunch   Chlorhexidine Gluconate Cloth  6 each Topical Daily   clopidogrel  75 mg Per Tube Daily   diltiazem  60 mg Per Tube Q6H   dolutegravir  50 mg Per Tube Daily   emtricitabine-tenofovir  1 tablet Per Tube Daily   enoxaparin (LOVENOX) injection  50 mg Subcutaneous Q24H   feeding supplement (OSMOLITE 1.5 CAL)  1,000 mL Per Tube Q24H   feeding supplement (PROSource TF20)  60 mL Per Tube Daily   fiber supplement (BANATROL TF)  60 mL Per Tube BID   fluconazole  400 mg Per Tube Daily   free water  100 mL Per Tube Q4H   Gerhardt's butt cream   Topical BID   glycopyrrolate  0.1 mg Intravenous QID   insulin aspart  0-20 Units Subcutaneous Q4H   insulin aspart  10 Units Subcutaneous 4 times per day   insulin glargine-yfgn  34 Units Subcutaneous BID   metoprolol tartrate  25 mg Per Tube BID   multivitamin with minerals  1 tablet Per Tube Daily   nicotine  14 mg Transdermal Daily   nystatin  5 mL Oral QID   mouth rinse  15 mL Mouth Rinse Q2H   pantoprazole (PROTONIX) IV  40 mg Intravenous Q12H   PARoxetine  20 mg Per Tube Daily   pneumococcal 20-valent conjugate vaccine  0.5 mL Intramuscular Tomorrow-1000   QUEtiapine  50 mg Per Tube BID    Continuous Infusions:    ceFEPime (MAXIPIME) IV 2 g  (10/18/23 0234)   dextrose       LOS: 22 days     Marcellus Scott, MD,  FACP, [facility], [facility], [facility]   [facility] [place]      To contact the attending provider between 7A-7P or the covering provider during after hours 7P-7A, please log into the web site [email or web address] and access using universal [place] password for that web site. If you do not have the password, please call the hospital operator.  10/18/2023, 9:05 AM

## 2023-10-18 NOTE — Care Management (Signed)
 Patient states she would love to have some coffee. Patient educated that she can not have anything by mouth since she is aspirating. She responded "I don't care when I get out of here I'm going to eat whatever in the hell I want", patient educated on the risk associated with eating and drinking and what aspiration is and explained that aspiration can lead to death to which she stated she didn't care.

## 2023-10-18 NOTE — Progress Notes (Addendum)
 SLP Note  Patient Details Name: Jeremiah Stevens MRN: [id number] DOB: 06-15-1976   Discussed plan of care with pt. Reminded her of severe dysphagia after her stroke and encouraged her to practice swallowing secretions today as much as possible. Pt observed to have almost constant wet vocal quality with PMSV in place (tolerating well) with frequent hiccups. Will plan for MBS for instrumental assessment of swallowing tomorrow. Strongly suspect pt will continue to demonstrate significant dysphagia given recurrent intubations and now a trach since last study. Completed a cognitive linguistic eval today to better understand pts cognitive capacity and how to help her comprehend her situation. See next note.    Chayse Zatarain, Riley Nearing 10/18/2023, 11:45 AM

## 2023-10-18 NOTE — Plan of Care (Signed)
  Problem: Metabolic: Goal: Ability to maintain appropriate glucose levels will improve Outcome: Progressing   Problem: Nutritional: Goal: Maintenance of adequate nutrition will improve Outcome: Progressing   Problem: Skin Integrity: Goal: Risk for impaired skin integrity will decrease Outcome: Progressing   Problem: Tissue Perfusion: Goal: Adequacy of tissue perfusion will improve Outcome: Progressing   Problem: Health Behavior/Discharge Planning: Goal: Ability to identify and utilize available resources and services will improve Outcome: Not Progressing Goal: Ability to manage health-related needs will improve Outcome: Not Progressing   Problem: Nutritional: Goal: Progress toward achieving an optimal weight will improve Outcome: Not Progressing   Problem: Health Behavior/Discharge Planning: Goal: Ability to manage health-related needs will improve Outcome: Not Progressing

## 2023-10-19 ENCOUNTER — Inpatient Hospital Stay (HOSPITAL_COMMUNITY)

## 2023-10-19 DIAGNOSIS — J189 Pneumonia, unspecified organism: Secondary | ICD-10-CM | POA: Diagnosis not present

## 2023-10-19 LAB — CBC
HCT: 28.3 % — ABNORMAL LOW (ref 39.0–52.0)
Hemoglobin: 9 g/dL — ABNORMAL LOW (ref 13.0–17.0)
MCH: 29.4 pg (ref 26.0–34.0)
MCHC: 31.8 g/dL (ref 30.0–36.0)
MCV: 92.5 fL (ref 80.0–100.0)
Platelets: 363 10*3/uL (ref 150–400)
RBC: 3.06 MIL/uL — ABNORMAL LOW (ref 4.22–5.81)
RDW: 14.9 % (ref 11.5–15.5)
WBC: 4.5 10*3/uL (ref 4.0–10.5)
nRBC: 0 % (ref 0.0–0.2)

## 2023-10-19 LAB — PROCALCITONIN: Procalcitonin: <0.1 ng/mL

## 2023-10-19 LAB — BASIC METABOLIC PANEL WITH GFR
Anion gap: 9 (ref 5–15)
BUN: 20 mg/dL (ref 6–20)
CO2: 28 mmol/L (ref 22–32)
Calcium: 8.7 mg/dL — ABNORMAL LOW (ref 8.9–10.3)
Chloride: 98 mmol/L (ref 98–111)
Creatinine, Ser: 1.07 mg/dL (ref 0.61–1.24)
GFR, Estimated: >60 mL/min (ref >60)
Glucose, Bld: 90 mg/dL (ref 70–99)
Potassium: 5.2 mmol/L — ABNORMAL HIGH (ref 3.5–5.1)
Sodium: 135 mmol/L (ref 135–145)

## 2023-10-19 LAB — GLUCOSE, CAPILLARY
Glucose-Capillary: 90 mg/dL (ref 70–99)
Glucose-Capillary: 72 mg/dL (ref 70–99)
Glucose-Capillary: 162 mg/dL — ABNORMAL HIGH (ref 70–99)
Glucose-Capillary: 103 mg/dL — ABNORMAL HIGH (ref 70–99)
Glucose-Capillary: 113 mg/dL — ABNORMAL HIGH (ref 70–99)

## 2023-10-19 LAB — C-REACTIVE PROTEIN: CRP: 6.1 mg/dL — ABNORMAL HIGH (ref <1.0)

## 2023-10-19 MED ORDER — OSMOLITE 1.5 CAL PO LIQD
1000.0000 mL | ORAL | Status: DC
  Administered 2023-10-19 – 2023-10-30 (×12): 1000 mL
  Filled 2023-10-19 (×25): qty 1000

## 2023-10-19 MED ORDER — SODIUM ZIRCONIUM CYCLOSILICATE 10 G PO PACK
10.0000 g | PACK | Freq: Two times a day (BID) | ORAL | Status: AC
  Administered 2023-10-19 (×2): 10 g
  Filled 2023-10-19 (×2): qty 1

## 2023-10-19 NOTE — Progress Notes (Signed)
 ATC setup changed. No complications. Vitals stable throughout. No complications. RT will continue to monitor.

## 2023-10-19 NOTE — Progress Notes (Signed)
 PT Cancellation Note  Patient Details Name: Jeremiah Stevens MRN: [id number] DOB: Dec 25, 1975   Cancelled Treatment:    Reason Eval/Treat Not Completed: Patient at procedure or test/unavailable  Pt agreeable to work with physical therapy today, eager to get out of bed. Initiated set-up but transport arrived to take pt to study off floor.  Will attempt later today vs tomorrow as schedule permits.  Kathlyn [place], PT, DPT [facility]  Rehabilitation Services Physical Therapist Office: [phone] Website: [email or web address]   Berton Mount 10/19/2023, 1:11 PM

## 2023-10-19 NOTE — TOC Progression Note (Addendum)
 Transition of Care [facility]) - Progression Note    Patient Details  Name: Jeremiah Stevens MRN: [id number] Date of Birth: 07/21/1975  Transition of Care [facility] CM/SW Contact  Mearl Latin, LCSW Phone Number: 10/19/2023, 9:13 AM  Clinical Narrative:    Patient transferred to 5W. SNF Placement barriers include insurance and patient received a new trach on 3/31. SNFs can review referral on 5/1. Janina Mayo will need to be changed to cuffless/uncuffed. Still with [facility].  CSW spoke wih Milagros Reap with [facility] SNFs and she will have her team review referral. Family would need to complete a [place] application if accepted.    Expected Discharge Plan: Skilled Nursing Facility Barriers to Discharge: English as a second language teacher, SNF Pending bed offer, Continued Medical Work up (new trach)  Expected Discharge Plan and Services In-house Referral: Clinical Social Work   Post Acute Care Choice: Skilled Nursing Facility Living arrangements for the past 2 months: Single Family Home                 DME Arranged: N/A DME Agency: NA                   Social Determinants of Health (SDOH) Interventions SDOH Screenings   Food Insecurity: No Food Insecurity (09/27/2023)  Housing: Low Risk  (09/27/2023)  Transportation Needs: Unmet Transportation Needs (09/27/2023)  Utilities: Not At Risk (09/27/2023)  Depression (PHQ2-9): Low Risk  (05/22/2020)  Tobacco Use: High Risk (10/11/2023)    Readmission Risk Interventions     No data to display

## 2023-10-19 NOTE — Progress Notes (Addendum)
 PROGRESS NOTE   Jeremiah Stevens  ZOX:[id number]    DOB: 05-05-1976    DOA: 09/26/2023  PCP: Judyann Munson, MD   I have briefly reviewed patients previous medical records in [facility].  Chief Complaint  Patient presents with   Dysphagia    Brief Hospital Course:  48 y.o. adult who with medical history including but not limited to DM2, seizures, HIV AIDS untreated, cryptococcal disease, obesity, polysubstance abuse, prior CVA. She was admitted 09/26/23 with dysphagia x 3 days along with headaches and drooling. He was found to be hypoxic to 80s on room air. CTA was negative for PE but showed multifocal PNA. He was started on abx and BD's. Biktarvy was continued and ID was consulted. 3/18 He had right facial droop, right sided weakness, dysarthria. Neurology was consulted and recommended CTA and MRI. MRI demonstrated acute right medullary infarct. Despite ongoing dysphagia (MBS showed moderately severe pharyngeal phase dysphagia and imaging showed unilateral bulging on right), pt insisted on eating. He reportedly ordered pizza overnight 3/18 and certainly aspirated. He had threatened to leave AMA despite stroke workup ongoing. 3/19, he had hypoxia for which rapid response was called. While being assess, he continued to desaturate down into the 60s and then 40s. He required BVM and was transferred to the ICU for further evaluation and management. Upon arrival in the ICU, she had ongoing desaturations and bradycardia before developing PEA arrest. He had 9 minutes ACLS prior to ROSC including epi x 4 and bicarb x 2. During intubation attempt, she had copious food material and emesis in her oropharynx. Visualization of vocal cords was difficult due to amount of food material and emesis despite suctioning. On 2nd attempt, ETT was passed successfully and saturations improved shortly thereafter. There was grand mal seizure activity noted after intubation that abated after Midazolam  administration.  Significant hospital events:  3/16 admit 3/18 neuro consult, found to have acute CVA 3/19 Ongoing aspiration despite obvious dysphagia, was planning to leave AMA but sister talked pt into staying. then had a massive aspiration event leading to worse hypoxia and PEA arrest. Txf to [facility] 3/20 PSV cumulative total of 5-6 hrs, following some commands/ purposefully 3/21 self extubated, was reintubated 3/23 Failed SBT due to WOB 3/24 Failed SBT due to apnea 3/25 Tolerated PS >4 hours 3/26: Intubated. 3/31: S/p tracheostomy with ENT. 4/4: trach collar starting 1200  4/5 cont on trach collar  4/7: Care transferred from [facility] to [facility]   Assessment & Plan:   Acute hypoxic resp failure - S/p trach - Multifocal Aspiration PNA - Aspiration event- Hx tobacco use - Dysphagia Tolerated trach collar trial off the vent, with mildly increased FiO2 requirement 25-40% on 4/6 . S/P a one-time dose of IV Lasix trial to improve the respiratory status on 4/6.   Currently without tachypnea, saturating in the high 90s on trach collar FiO2 35%, 10 L/min.  Discussed with RN at bedside to attempt to wean. Tracheostomy management per CCM.   -GPC on trach asp 4/1, on cefepime #7/7 -SLP consult for PMV eval.  Patient using PMV this morning. 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  Recommending LTAC. -ASA, statin    Acute encephalopathy Anxiety, depression  P -minimize CNS depressing meds -delirium precautions  -Meeting her for the first time but appears alert and oriented, coherent.  Encephalopathy seems to have resolved.   PEA  arrest  -?Sz like activity following, stable echocardiogram with preserved EF no wall motion abnormality. P -supportive care    HFpEF EF 65% Hx HTN HLD -statin, dilt, metop PRN hydral and PRN labetalol  Blood pressure is controlled.   Normocytic  anemia -Follow intermittent CBC.  Hemoglobin stable in the 8-9 g range.   HIV / AIDS Hx +cryptococcal antigen  P -Seen by ID currently on following regimen per ID, post discharge follow-up with ID in the office.  Previously untreated.  Counseled on compliance.  -Continue p.o. fluconazole 400 mg daily on dischare QTc 385 -Continue Descovy/Tivicay  -Continue atovaquone for PJP prophylaxis     DM2, with hyperglycemia, on steroids -cont basal + SSI.  Good control on current regimen.  CBG (last 3)  Recent Labs    10/18/23 2131 10/19/23 0137 10/19/23 0431  GLUCAP 141* 90 72   Lab Results  Component Value Date   HGBA1C 10.8 (H) 09/29/2023    Code status -Full  Medical noncompliance. As per chart review and detailed discussion with overnight ICU RN, patient has medical decision making capacity but is very noncompliant-pulled out feeding tube, attempts to pull out tracheostomy, asked multiple people if he/she can have something to eat or drink.  Counseled not to take any oral diet unless and until cleared by speech therapy, risks and benefits explained.  Explained the risk of sudden aspiration, respiratory arrest, death.      Body mass index is 33.97 kg/m.  Obesity.  Follow with PCP.    Nutritional Status Nutrition Problem: Increased nutrient needs Etiology: chronic illness Signs/Symptoms: estimated needs Interventions: Tube feeding, Prostat   DVT prophylaxis: SCDs Start: 09/27/23 0018.  Lovenox   Code Status: Full Code:  Family Communication: None at bedside Disposition:  Status is: Inpatient  Consultants:   ENT Psychiatry PCCM Neurology Infectious disease  Subjective:   Patient in bed, appears comfortable, denies any headache, no fever, no chest pain or pressure, no shortness of breath , no abdominal pain. No new focal weakness.   Objective:   Vitals:   10/18/23 2155 10/19/23 0023 10/19/23 0300 10/19/23 0539  BP:  (!) 97/55 94/68 (!) 115/55  Pulse:       Resp:  16 20   Temp:  97.9 F (36.6 C) 98.9 F (37.2 C)   TempSrc:  Axillary Oral   SpO2: 100% 93%    Weight:      Height:        Awake Alert, No new F.N deficits, NG tube in place, tracheostomy site stable with trach collar [place].AT,PERRAL Supple Neck, No JVD,   Symmetrical Chest wall movement, Good air movement bilaterally, CTAB RRR,No Gallops, Rubs or new Murmurs,  +ve B.Sounds, Abd Soft, No tenderness,   No Cyanosis, Clubbing or edema   Data Reviewed:   I have personally reviewed following labs and imaging studies    Data Review:   Inpatient Medications  Scheduled Meds:  aspirin  81 mg Per Tube Daily   atorvastatin  20 mg Per Tube QHS   atovaquone  1,500 mg Per Tube Q lunch   Chlorhexidine Gluconate Cloth  6 each Topical Daily   clopidogrel  75 mg Per Tube Daily   diltiazem  60 mg Per Tube Q6H   dolutegravir  50 mg Per Tube Daily   emtricitabine-tenofovir  1 tablet Per Tube Daily   enoxaparin (LOVENOX) injection  50 mg Subcutaneous Q24H   feeding supplement (PROSource TF20)  60 mL Per Tube Daily   fiber  supplement (BANATROL TF)  60 mL Per Tube BID   fluconazole  400 mg Per Tube Daily   free water  100 mL Per Tube Q4H   Gerhardt's butt cream   Topical BID   glycopyrrolate  0.1 mg Intravenous QID   insulin aspart  0-20 Units Subcutaneous Q4H   insulin aspart  10 Units Subcutaneous 4 times per day   insulin glargine-yfgn  34 Units Subcutaneous BID   metoprolol tartrate  25 mg Per Tube BID   multivitamin with minerals  1 tablet Per Tube Daily   nicotine  14 mg Transdermal Daily   nystatin  5 mL Oral QID   mouth rinse  15 mL Mouth Rinse Q2H   pantoprazole (PROTONIX) IV  40 mg Intravenous Q12H   PARoxetine  20 mg Per Tube Daily   pneumococcal 20-valent conjugate vaccine  0.5 mL Intramuscular Tomorrow-1000   QUEtiapine  50 mg Per Tube BID   sodium zirconium cyclosilicate  10 g Per Tube BID   Continuous Infusions:  ceFEPime (MAXIPIME) IV 2 g (10/19/23 0243)    dextrose     feeding supplement (OSMOLITE 1.5 CAL) 1,000 mL (10/19/23 0446)   PRN Meds:.acetaminophen **OR** acetaminophen, alum & mag hydroxide-simeth, bisacodyl, dextrose, fentaNYL (SUBLIMAZE) injection, hydrALAZINE, hydrOXYzine, labetalol, levalbuterol, loperamide HCl, melatonin, ondansetron **OR** ondansetron (ZOFRAN) IV, mouth rinse, oxyCODONE, polyethylene glycol  Recent Labs  Lab 10/14/23 0314 10/15/23 0316 10/16/23 0212 10/18/23 0223 10/19/23 0321  WBC 5.1 4.3 4.3 5.2 4.5  HGB 8.4* 8.7* 8.1* 9.1* 9.0*  HCT 26.9* 28.9* 26.0* 28.2* 28.3*  PLT 197 223 249 299 363  MCV 94.7 96.0 94.9 93.1 92.5  MCH 29.6 28.9 29.6 30.0 29.4  MCHC 31.2 30.1 31.2 32.3 31.8  RDW 15.3 15.5 15.3 15.0 14.9    Recent Labs  Lab 10/12/23 1817 10/13/23 0025 10/13/23 0025 10/14/23 0314 10/15/23 0316 10/16/23 0212 10/16/23 1054 10/18/23 0223 10/19/23 0321  NA  --  138   < > 137 141 139  --  136 135  K  --  4.8   < > 4.6 4.4 4.3  --  4.4 5.2*  CL  --  98   < > 97* 100 103  --  101 98  CO2  --  32   < > 31 35* 31  --  28 28  ANIONGAP  --  8   < > 9 6 5   --  7 9  GLUCOSE  --  217*   < > 227* 195* 175*  --  145* 90  BUN  --  28*   < > 29* 27* 22*  --  21* 20  CREATININE  --  1.07   < > 1.13 1.27* 0.98  --  1.08 1.07  AST  --   --   --   --   --   --  19  --   --   ALT  --   --   --   --   --   --  29  --   --   ALKPHOS  --   --   --   --   --   --  78  --   --   BILITOT  --   --   --   --   --   --  0.4  --   --   ALBUMIN  --   --   --   --   --   --  2.2*  --   --   PROCALCITON 0.10 0.17  --  0.21 0.18  --   --   --   --   INR  --  1.1  --   --   --   --   --   --   --   MG  --  2.2  --  2.4 2.6* 2.4  --   --   --   CALCIUM  --  8.7*   < > 8.2* 8.3* 8.1*  --  8.4* 8.7*   < > = values in this interval not displayed.      Recent Labs  Lab 10/12/23 1817 10/13/23 0025 10/13/23 0025 10/14/23 0314 10/15/23 0316 10/16/23 0212 10/18/23 0223 10/19/23 0321  PROCALCITON 0.10 0.17  --  0.21  0.18  --   --   --   INR  --  1.1  --   --   --   --   --   --   MG  --  2.2  --  2.4 2.6* 2.4  --   --   CALCIUM  --  8.7*   < > 8.2* 8.3* 8.1* 8.4* 8.7*   < > = values in this interval not displayed.    --------------------------------------------------------------------------------------------------------------- Lab Results  Component Value Date   CHOL 161 09/29/2023   HDL 28 (L) 09/29/2023   LDLCALC 95 09/29/2023   TRIG 219 (H) 09/30/2023   CHOLHDL 5.8 09/29/2023    Lab Results  Component Value Date   HGBA1C 10.8 (H) 09/29/2023     Micro Results Recent Results (from the past 240 hours)  Culture, Respiratory w Gram Stain     Status: None   Collection Time: 10/12/23  4:41 PM   Specimen: Tracheal Aspirate; Respiratory  Result Value Ref Range Status   Specimen Description TRACHEAL ASPIRATE  Final   Special Requests NONE  Final   Gram Stain   Final    ABUNDANT WBC PRESENT, PREDOMINANTLY PMN RARE GRAM POSITIVE COCCI IN PAIRS    Culture   Final    FEW Normal respiratory flora-no Staph aureus or Pseudomonas seen Performed at [facility], [address] [place], [place] [address]    Report Status 10/14/2023 FINAL  Final  Culture, blood (Routine X 2) w Reflex to ID Panel     Status: None   Collection Time: 10/13/23 12:24 AM   Specimen: BLOOD LEFT HAND  Result Value Ref Range Status   Specimen Description BLOOD LEFT HAND  Final   Special Requests   Final    BOTTLES DRAWN AEROBIC AND ANAEROBIC Blood Culture adequate volume   Culture   Final    NO GROWTH 5 DAYS Performed at [facility], [address], [place], [place] [address]    Report Status 10/18/2023 FINAL  Final  Culture, blood (Routine X 2) w Reflex to ID Panel     Status: None   Collection Time: 10/13/23 12:26 AM   Specimen: BLOOD LEFT ARM  Result Value Ref Range Status   Specimen Description BLOOD LEFT ARM  Final   Special Requests   Final    BOTTLES DRAWN AEROBIC AND ANAEROBIC Blood Culture  adequate volume   Culture   Final    NO GROWTH 5 DAYS Performed at [facility], [address] [place], [place] [address]    Report Status 10/18/2023 FINAL  Final    Radiology Reports  No results found.  Signature  -   Susa Raring M.D on 10/19/2023 at 7:48 AM   -  To page go to [email or web address]

## 2023-10-19 NOTE — Evaluation (Signed)
 Modified Barium Swallow Study  Patient Details  Name: Jeremiah Stevens MRN: [id number] Date of Birth: 06/23/1976  Today's Date: 10/19/2023  Modified Barium Swallow completed.  Full report located under Chart Review in the Imaging Section.  History of Present Illness Jeremiah Stevens is a 48 y.o. adult who identifies as male, admitted 09/26/23 with dysphagia x 3 days along with headaches and drooling. She was found to be hypoxic to 80s on room air. CTA showed multifocal PNA.  3/18 she had right facial droop, right sided weakness, dysarthria. MRI demonstrated acute right medullary infarct. Despite ongoing dysphagia (MBS showed moderately severe pharyngeal phase dysphagia and imaging showed unilateral bulging on right), pt insisted on eating. She reportedly ordered pizza overnight 3/18 and certainly aspirated, desaturated, ultimately suffered PEA arrest, 9 minutes ACLS, copious food material and emesis in oropharynx. Self extubated 3/21, reintubated and extubated 3/26, Trach on 3/31, ATC on 4/4.   Clinical Impression Pt presents with a profound oropharyngeal dysphagia (DIGEST Score: 4) primarily characterized by chronic and gross silent aspiration of all consistencies tested due to minimal to no pharyngeal Jeremiah of bolus trials.    Multiple hard cough and throat clears and suctioning were required for clearing bolus samples out of pharynx. Oral transit was mostly brisk with trace residue on tongue. Swallow inititation was primarily at level of pyriforms. Silent aspiration events were a result of minimal transit of bolus through the pharynx and UES. Pharyngeal stripping wave was not present and hyolaryngeal excursion was minimal. Epiglottic inversion was not present, with only minimal movement observed. Passage through UES was very minimal for majority of consistencies but not present during puree. All these components resulted in minimal to no pharyngeal Jeremiah, leading to gross aspiration  of consistencies with no immediate cough response from pt. Pt required verbal cueing to perform hard coughs and throat clears; Multiple hard cough and throat clears and suctioning were required for clearing bolus samples out of pharynx. Coughing and throat clearing appeared to not be effective for clearing deep aspiration but only for more penetrated material near laryngeal vestibule opening. Pt required lots of suctioning for puree trial due to no pharygneal Jeremiah of bolus. Right and left head turns, chin tuck, and effortful swallow were utilized but were not effective for improving swallowing efficiency or safety.   Recommend continued NPO status due to severe risk for aspirating large volumes of any bolus consistency. Pt can have ice chips but only after oral care; pt must use a slow rate and small bites to optimize airway safety. SLP will f/u to monitor pt's tolerance of ice chips and provide exercises for rehabilitating pharyngeal swallow. Prognosis in the short-term for swallow recovery is poor. Pt's current cognitive status is still impacting her ability to understand and accept results. Expect MD to have discussions with pt about results and further plans for intervention.   Factors that may increase risk of adverse event in presence of aspiration Jeremiah Stevens & Jeremiah Stevens 2021): Presence of tubes (ETT, trach, NG, etc.);Frequent aspiration of large volumes;Poor general health and/or compromised immunity  Swallow Evaluation Recommendations Recommendations: NPO;Ice chips PRN after oral care Medication Administration: Via alternative means Swallowing strategies  : Slow rate;Small bites/sips Postural changes: Position pt fully upright for meals;Stay upright 30-60 min after meals Oral care recommendations: Oral care BID (2x/day) Caregiver Recommendations: Have oral suction available      Rowe Robert 10/19/2023,2:54 PM

## 2023-10-19 NOTE — Plan of Care (Signed)
  Problem: Health Behavior/Discharge Planning: Goal: Ability to identify and utilize available resources and services will improve Outcome: Not Progressing Goal: Ability to manage health-related needs will improve Outcome: Not Progressing   Problem: Nutritional: Goal: Maintenance of adequate nutrition will improve Outcome: Progressing

## 2023-10-20 DIAGNOSIS — J189 Pneumonia, unspecified organism: Secondary | ICD-10-CM | POA: Diagnosis not present

## 2023-10-20 LAB — CBC WITH DIFFERENTIAL/PLATELET
Abs Immature Granulocytes: 0.02 10*3/uL (ref 0.00–0.07)
Basophils Absolute: 0 10*3/uL (ref 0.0–0.1)
Basophils Relative: 0 %
Eosinophils Absolute: 0.1 10*3/uL (ref 0.0–0.5)
Eosinophils Relative: 3 %
HCT: 26.3 % — ABNORMAL LOW (ref 39.0–52.0)
Hemoglobin: 8.3 g/dL — ABNORMAL LOW (ref 13.0–17.0)
Immature Granulocytes: 1 %
Lymphocytes Relative: 16 %
Lymphs Abs: 0.6 10*3/uL — ABNORMAL LOW (ref 0.7–4.0)
MCH: 29.2 pg (ref 26.0–34.0)
MCHC: 31.6 g/dL (ref 30.0–36.0)
MCV: 92.6 fL (ref 80.0–100.0)
Monocytes Absolute: 0.5 10*3/uL (ref 0.1–1.0)
Monocytes Relative: 13 %
Neutro Abs: 2.6 10*3/uL (ref 1.7–7.7)
Neutrophils Relative %: 67 %
Platelets: 361 10*3/uL (ref 150–400)
RBC: 2.84 MIL/uL — ABNORMAL LOW (ref 4.22–5.81)
RDW: 15.1 % (ref 11.5–15.5)
WBC: 3.9 10*3/uL — ABNORMAL LOW (ref 4.0–10.5)
nRBC: 0 % (ref 0.0–0.2)

## 2023-10-20 LAB — GLUCOSE, CAPILLARY
Glucose-Capillary: 101 mg/dL — ABNORMAL HIGH (ref 70–99)
Glucose-Capillary: 106 mg/dL — ABNORMAL HIGH (ref 70–99)
Glucose-Capillary: 143 mg/dL — ABNORMAL HIGH (ref 70–99)
Glucose-Capillary: 160 mg/dL — ABNORMAL HIGH (ref 70–99)
Glucose-Capillary: 173 mg/dL — ABNORMAL HIGH (ref 70–99)
Glucose-Capillary: 174 mg/dL — ABNORMAL HIGH (ref 70–99)
Glucose-Capillary: 96 mg/dL (ref 70–99)

## 2023-10-20 LAB — BASIC METABOLIC PANEL WITH GFR
Anion gap: 7 (ref 5–15)
BUN: 17 mg/dL (ref 6–20)
CO2: 31 mmol/L (ref 22–32)
Calcium: 8.6 mg/dL — ABNORMAL LOW (ref 8.9–10.3)
Chloride: 98 mmol/L (ref 98–111)
Creatinine, Ser: 0.97 mg/dL (ref 0.61–1.24)
GFR, Estimated: >60 mL/min (ref >60)
Glucose, Bld: 135 mg/dL — ABNORMAL HIGH (ref 70–99)
Potassium: 4.4 mmol/L (ref 3.5–5.1)
Sodium: 136 mmol/L (ref 135–145)

## 2023-10-20 LAB — C-REACTIVE PROTEIN: CRP: 4.7 mg/dL — ABNORMAL HIGH (ref <1.0)

## 2023-10-20 LAB — MAGNESIUM: Magnesium: 2.5 mg/dL — ABNORMAL HIGH (ref 1.7–2.4)

## 2023-10-20 LAB — PHOSPHORUS: Phosphorus: 3.5 mg/dL (ref 2.5–4.6)

## 2023-10-20 LAB — PROCALCITONIN: Procalcitonin: <0.1 ng/mL

## 2023-10-20 NOTE — Plan of Care (Signed)
  Problem: Health Behavior/Discharge Planning: Goal: Ability to identify and utilize available resources and services will improve Outcome: Not Progressing Goal: Ability to manage health-related needs will improve Outcome: Not Progressing   Problem: Education: Goal: Knowledge of patient specific risk factors will improve (DELETE if not current risk factor) Outcome: Not Progressing

## 2023-10-20 NOTE — Plan of Care (Signed)
   Problem: Health Behavior/Discharge Planning: Goal: Ability to identify and utilize available resources and services will improve Outcome: Not Progressing Goal: Ability to manage health-related needs will improve Outcome: Not Progressing

## 2023-10-20 NOTE — Progress Notes (Signed)
 Tried calling IV team throughout day to get PIV placed. Pt was difficult stick. Needs PIV to give ABX

## 2023-10-20 NOTE — Progress Notes (Addendum)
 Physical Therapy Treatment Patient Details Name: Jeremiah Stevens MRN: [id number] DOB: March 02, 1976 Today's Date: 10/20/2023   History of Present Illness 48 yo pt admitted to [facility]/26/23 for difficulty swallowing. 3/19 PEA, ROSC after 2 min, then bradycardic arrest 1 min later, CPR resumed, pt intubated, and ROSC 6 min later. Seizure-like activity also noted briefly after ROSC. 3/20 pt transferred to [facility]. 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 3/21 self extubated and reintubated. 3/31 trach. PMHx: HIV/AIDS, HTN, herpes, migraine, depression, DM II, polysubstance use, and seizure.    PT Comments  Pt in bed upon arrival and agreeable to PT session. Worked on transfers and gait training in today's session. Pt has made significant progress towards goals by needing less assistance to stand and being able to take steps. Pt required MinAx2 for safety to stand with use of RW. Pt was able to perform lateral side-steps and forward steps with CGAx2 for safety with use of RW. In between sets, pt needed a seated rest break due to feeling SOB with unreliable pleth reading. Acute PT goals updated as pt has met all goals. Pt would benefit from <3hrs post acute rehab with TOC following regarding barriers to placement. Acute PT to follow.   Trach- 6L, 28 FiO2 94% SpO2 at rest, lowest SpO2 78% with inaccurate pleth reading    If plan is discharge home, recommend the following: Assistance with cooking/housework;Assist for transportation;Help with stairs or ramp for entrance;A little help with walking and/or transfers;A little help with bathing/dressing/bathroom   Can travel by private vehicle     Yes  Equipment Recommendations  Wheelchair cushion (measurements PT);Wheelchair (measurements PT);Hospital bed       Precautions / Restrictions Precautions Precautions: Fall;Other (comment) Recall of Precautions/Restrictions: Intact Precaution/Restrictions Comments: trach, cortrak,  primofit, PMV Restrictions Weight Bearing Restrictions Per Provider Order: No     Mobility  Bed Mobility Overal bed mobility: Needs Assistance Bed Mobility: Supine to Sit Rolling: Contact guard assist, Used rails    General bed mobility comments: CGA for safety, cues for sequencing and hand placement with use of rail    Transfers Overall transfer level: Needs assistance Equipment used: Rolling walker (2 wheels) Transfers: Sit to/from Stand, Bed to chair/wheelchair/BSC Sit to Stand: Min assist, +2 safety/equipment   Step pivot transfers: Contact guard assist    General transfer comment: MinA for slight boost, x2 for safety with lines. No physical assist when taking steps to recliner    Ambulation/Gait Ambulation/Gait assistance: Contact guard assist, +2 safety/equipment Gait Distance (Feet): 6 Feet (x4, x2, x6) Assistive device: Rolling walker (2 wheels) Gait Pattern/deviations: Step-through pattern, Trunk flexed, Wide base of support, Shuffle Gait velocity: decr     General Gait Details: short and steady steps with forward flexed trunk and wide BOS. x3 seated rest breaks due to feeling SOB, unreliable pleth reading     Balance Overall balance assessment: Needs assistance Sitting-balance support: No upper extremity supported, Feet supported Sitting balance-Leahy Scale: Fair     Standing balance support: Bilateral upper extremity supported, During functional activity, Reliant on assistive device for balance Standing balance-Leahy Scale: Poor Standing balance comment: reliant on RW      Communication Communication Communication: Impaired Factors Affecting Communication: Trach/intubated;Passey - Muir valve  Cognition Arousal: Alert Behavior During Therapy: WFL for tasks assessed/performed   PT - Cognitive impairments: No apparent impairments Difficult to assess due to: Tracheostomy    PT - Cognition Comments: reduced clarity of speech on PMV Following commands:  Intact Following commands impaired: Follows multi-step commands with increased time, Only follows one step commands consistently    Cueing Cueing Techniques: Verbal cues, Tactile cues         Pertinent Vitals/Pain Pain Assessment Pain Assessment: No/denies pain     PT Goals (current goals can now be found in the care plan section) Acute Rehab PT Goals Patient Stated Goal: to return home PT Goal Formulation: With patient Time For Goal Achievement: 11/03/23 Potential to Achieve Goals: Fair Progress towards PT goals: Progressing toward goals    Frequency    Min 2X/week           Co-evaluation   Reason for Co-Treatment: For patient/therapist safety;To address functional/ADL transfers PT goals addressed during session: Mobility/safety with mobility;Balance;Proper use of DME        AM-PAC PT "6 Clicks" Mobility   Outcome Measure  Help needed turning from your back to your side while in a flat bed without using bedrails?: A Little Help needed moving from lying on your back to sitting on the side of a flat bed without using bedrails?: A Lot Help needed moving to and from a bed to a chair (including a wheelchair)?: A Little Help needed standing up from a chair using your arms (e.g., wheelchair or bedside chair)?: A Little Help needed to walk in hospital room?: A Lot Help needed climbing 3-5 steps with a railing? : Total 6 Click Score: 14    End of Session Equipment Utilized During Treatment: Gait belt Activity Tolerance: Patient tolerated treatment well Patient left: in chair;with call bell/phone within reach Nurse Communication: Mobility status PT Visit Diagnosis: Unsteadiness on feet (R26.81);Muscle weakness (generalized) (M62.81);Other abnormalities of gait and mobility (R26.89);Difficulty in walking, not elsewhere classified (R26.2);Other symptoms and signs involving the nervous system (R29.898)     Time: 1610-9604 PT Time Calculation (min) (ACUTE ONLY): 32  min  Charges:    $Gait Training: 8-22 mins PT General Charges $$ ACUTE PT VISIT: 1 Visit                     Hilton Cork, PT, DPT Secure Chat Preferred  Rehab Office [phone]    Arturo Morton Brion Aliment 10/20/2023, 2:12 PM

## 2023-10-20 NOTE — Progress Notes (Signed)
 Nutrition Follow-up  DOCUMENTATION CODES:   Obesity unspecified  INTERVENTION:  Continue tube feeding via cortrak tube: Osmolite 1.5 at 43mL/h x 18 hours (1530 ml per day, infuse from 12PM to 6AM) Ensure that tube feed is held 2 hours prior to Tivicay and 1 hour after (from 8:30AM-11:30AM) Prosource TF20 60 ml 1x/d Free water flushes q4 hours Provides 2375 kcal, 116 gm protein, 1166 ml free water daily (TF + flush = 1766 mL free water)  Continue MVI with minerals daily via tube.  Continue Banatrol TF BID via tube.   NUTRITION DIAGNOSIS:  Increased nutrient needs related to chronic illness as evidenced by estimated needs. - remains applicable  GOAL:  Patient will meet greater than or equal to 90% of their needs - being met with tube feeds at goal rate  MONITOR:  TF tolerance  REASON FOR ASSESSMENT:  Consult Enteral/tube feeding initiation and management  ASSESSMENT:   48 y.o. adult with medical history significant of diabetes type 2, seizure disorder, HIV untreated, obesity, polysubstance abuse .  Patient is admitted for pneumonia with hypoxia in the setting of HIV.  Previous Admission 2/12 Admitted for metabolic encephalopathy 2/14 - Cortrak placed 2/15 - Cortrak removed, Passed swallow eval, carb modified diet  Current Admission 3/16 - Admitted to [facility] for PNA 3/18 - MBS showed chronic aspiration of secretions, no diet formally recommended, MRI showed acute CVA 3/19 - PEA arrest and intubation s/p aspiration event, txr to ICU 3/20 - transferred to [facility] for continuous EEG monitoring, trickles initiated 3/21 - cortrak tube placed, pt self-extubated, emergently reintubated 3/23 - SBT: failed 3/24 - SBT: failed 3/25 - tolerated PS >4 hours 3/26 - intubated  3/29 - pt TF rate increased r/t feeling hungry 3/31 - tracheostomy placed in OR with ENT 4/4 - trach collar  4/7 - txr to floor from ICU  4/8 MBSS: Failed - NPO  Discussed patient during IDT  rounds this morning. Pt failed modified barium swallow yesterday. Remains NPO.  Per MD, will require PEG placement should she continue to fail swallow evaluations. Has had Cortrak for almost three weeks. Will re-assess in a few days. PT/OT/SLP recommend LTAC, however patient does not have LTAC benefits.   Patient difficult to rouse at bedside and unwilling to engage at this time. Noted both an increased rate and decreased duration of tube feeding over last week. This was r/t pt endorsing feelings of hunger. No more complaints of this documented since adjustment to TF regimen. Free water flushes reduced to q4 from during that time frame as well.   Admit Weight: 119.5kg Current Weight: 115.3kg  Wt stable. No documented N/V/C/D. No distention noted to abdomen. Bowels stable. Banatrol BID continues. Can be increased to QID if needed. Appears to be tolerating current regimen well.  Significant amount of oral secretions observed at time of assessment. Can hear rattling with each breath. Continuing to monitor for appropriateness of PEG placement. Recommend transition to bolus feeds at that time to better mimic traditional meal times and prevent feelings of hunger and stabilize blood sugars.   Drains/Lines: Tracheostomy Cortrak (43 inches)  Meds: tivicay, fluconazole, SSI 0-20 Q4, SSI 10 QID, Semglee 34 BID, MVI, nyastatin, pantoprazole Drips: IV ABX  Labs: Na+ 136 (wdl) K+ 5.2>4.4 (wdl) Mg 2.6>2.4>2.5 (H) CRP 6.1>4.7 (H) CBGs 90-135 x24 hours A1c 10.8 (09/2023)  Diet Order:   Diet Order             Diet NPO time specified  Except for: Ice Chips  Diet effective now             EDUCATION NEEDS:   Education needs have been addressed  Skin:  Skin Assessment: Reviewed RN Assessment  Last BM:  4/7 - type 6 x2  Height:  Ht Readings from Last 1 Encounters:  10/07/23 6' (1.829 m)   Weight:  Wt Readings from Last 1 Encounters:  10/20/23 115.3 kg   Ideal Body Weight:  80.9  kg  BMI:  Body mass index is 34.47 kg/m.  Estimated Nutritional Needs:   Kcal:  2200-2500 kcal/d  Protein:  110-120g  Fluid:  2.4L/day  Myrtie Cruise MS, RD, LDN Registered Dietitian Clinical Nutrition RD Inpatient Contact Info in Amion

## 2023-10-20 NOTE — Progress Notes (Signed)
 PROGRESS NOTE   Jeremiah Stevens  ZOX:[id number]    DOB: [birth year]    DOA: 09/26/2023  PCP: Judyann Munson, MD   I have briefly reviewed patients previous medical records in [facility].  Chief Complaint  Patient presents with   Dysphagia    Brief Hospital Course:  48 y.o. adult who with medical history including but not limited to DM2, seizures, HIV AIDS untreated, cryptococcal disease, obesity, polysubstance abuse, prior CVA. She was admitted 09/26/23 with dysphagia x 3 days along with headaches and drooling. He was found to be hypoxic to 80s on room air. CTA was negative for PE but showed multifocal PNA. He was started on abx and BD's. Biktarvy was continued and ID was consulted. 3/18 He had right facial droop, right sided weakness, dysarthria. Neurology was consulted and recommended CTA and MRI. MRI demonstrated acute right medullary infarct. Despite ongoing dysphagia (MBS showed moderately severe pharyngeal phase dysphagia and imaging showed unilateral bulging on right), pt insisted on eating. He reportedly ordered pizza overnight 3/18 and certainly aspirated. He had threatened to leave AMA despite stroke workup ongoing. 3/19, he had hypoxia for which rapid response was called. While being assess, he continued to desaturate down into the 60s and then 40s. He required BVM and was transferred to the ICU for further evaluation and management. Upon arrival in the ICU, she had ongoing desaturations and bradycardia before developing PEA arrest. He had 9 minutes ACLS prior to ROSC including epi x 4 and bicarb x 2. During intubation attempt, she had copious food material and emesis in her oropharynx. Visualization of vocal cords was difficult due to amount of food material and emesis despite suctioning. On 2nd attempt, ETT was passed successfully and saturations improved shortly thereafter. There was grand mal seizure activity noted after intubation that abated after Midazolam  administration.  Significant hospital events:  3/16 admit 3/18 neuro consult, found to have acute CVA 3/19 Ongoing aspiration despite obvious dysphagia, was planning to leave AMA but sister talked pt into staying. then had a massive aspiration event leading to worse hypoxia and PEA arrest. Txf to [facility] 3/20 PSV cumulative total of 5-6 hrs, following some commands/ purposefully 3/21 self extubated, was reintubated 3/23 Failed SBT due to WOB 3/24 Failed SBT due to apnea 3/25 Tolerated PS >4 hours 3/26: Intubated. 3/31: S/p tracheostomy with ENT. 4/4: trach collar starting 1200  4/5 cont on trach collar  4/7: Care transferred from [facility] to [facility] 4/8: Modified barium swallow.  Failed.  NPO.   Assessment & Plan:   Acute hypoxic resp failure - S/p trach - Multifocal Aspiration PNA - Aspiration event- Hx tobacco use - Dysphagia Tolerated trach collar trial off the vent, with mildly increased FiO2 requirement 25-40% on 4/6 . S/P a one-time dose of IV Lasix trial to improve the respiratory status on 4/6.   Currently without tachypnea, saturating in the high 90s on trach collar FiO2 35%, 10 L/min.  Discussed with RN at bedside to attempt to wean. Tracheostomy management per PCCM and ENT Dr. Pollyann [place].  Case discussed with Dr. Pollyann [place] on 10/20/2023, has deferred trach changes to RT.  He will be available as needed. Patient currently has #8 Shiley cuffed trach as of 10/20/2023   -GPC on trach asp 10/12/23, on cefepime #7/7 -SLP following for dysphagia and PMV valve use. -added CPT  -Unfortunately does not have LTAC benefits    Dysphagia.  Still on NG tube feeds.  Speech therapy following unfortunately failed another modified barium  swallow on 10/19/2023.  Will monitor for another few days if continues to fail swallow evaluations will require a PEG tube.     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  Recommending LTAC. -ASA, statin, seen by  neurology this admission.   Acute encephalopathy Anxiety, depression  P -minimize CNS depressing meds -delirium precautions  -Encephalopathy has resolved.   PEA arrest  -?Sz like activity following, stable echocardiogram with preserved EF no wall motion abnormality. P -supportive care    HFpEF EF 65% Hx HTN HLD -statin, dilt, metop PRN hydral and PRN labetalol  Blood pressure is controlled.   Normocytic anemia -Follow intermittent CBC.  Hemoglobin stable in the 8-9 g range.   HIV / AIDS Hx +cryptococcal antigen  P -Seen by ID currently on following regimen per ID, post discharge follow-up with ID in the office.  Previously untreated.  Counseled on compliance.  -Continue p.o. fluconazole 400 mg daily on dischare QTc 385 -Continue Descovy/Tivicay  -Continue atovaquone for PJP prophylaxis  DM2, with hyperglycemia, on steroids -cont basal + SSI.  Good control on current regimen.  CBG (last 3)  Recent Labs    10/20/23 0037 10/20/23 0335 10/20/23 0740  GLUCAP 173* 106* 96   Lab Results  Component Value Date   HGBA1C 10.8 (H) 09/29/2023    Code status -Full  Medical noncompliance. As per chart review and detailed discussion with overnight ICU RN, patient has medical decision making capacity but is very noncompliant-pulled out feeding tube, attempts to pull out tracheostomy, asked multiple people if he/she can have something to eat or drink.  Counseled not to take any oral diet unless and until cleared by speech therapy, risks and benefits explained.  Explained the risk of sudden aspiration, respiratory arrest, death.      Body mass index is 34.47 kg/m.  Obesity.  Follow with PCP.    Nutritional Status Nutrition Problem: Increased nutrient needs Etiology: chronic illness Signs/Symptoms: estimated needs Interventions: Tube feeding, Prostat   DVT prophylaxis: SCDs Start: 09/27/23 0018.  Lovenox   Code Status: Full Code:  Family Communication: None at  bedside Disposition:  Status is: Inpatient  Consultants:   ENT Psychiatry PCCM Neurology Infectious disease  Subjective:   Patient in bed, appears comfortable, denies any headache, no fever, no chest pain or pressure, no shortness of breath , no abdominal pain. No new focal weakness.   Objective:   Vitals:   10/20/23 0000 10/20/23 0342 10/20/23 0409 10/20/23 0744  BP: (!) 96/56   110/72  Pulse:    80  Resp:    (!) 22  Temp:  98.2 F (36.8 C)  98.7 F (37.1 C)  TempSrc:  Oral  Oral  SpO2:      Weight:   115.3 kg   Height:        Awake Alert, No new F.N deficits, NG tube in place, tracheostomy site stable with trach collar [place].AT,PERRAL Supple Neck, No JVD,   Symmetrical Chest wall movement, Good air movement bilaterally, CTAB RRR,No Gallops, Rubs or new Murmurs,  +ve B.Sounds, Abd Soft, No tenderness,   No Cyanosis, Clubbing or edema   Data Reviewed:   I have personally reviewed following labs and imaging studies    Data Review:   Inpatient Medications  Scheduled Meds:  aspirin  81 mg Per Tube Daily   atorvastatin  20 mg Per Tube QHS   atovaquone  1,500 mg Per Tube Q  lunch   Chlorhexidine Gluconate Cloth  6 each Topical Daily   clopidogrel  75 mg Per Tube Daily   diltiazem  60 mg Per Tube Q6H   dolutegravir  50 mg Per Tube Daily   emtricitabine-tenofovir  1 tablet Per Tube Daily   enoxaparin (LOVENOX) injection  50 mg Subcutaneous Q24H   feeding supplement (PROSource TF20)  60 mL Per Tube Daily   fiber supplement (BANATROL TF)  60 mL Per Tube BID   fluconazole  400 mg Per Tube Daily   free water  100 mL Per Tube Q4H   Gerhardt's butt cream   Topical BID   insulin aspart  0-20 Units Subcutaneous Q4H   insulin aspart  10 Units Subcutaneous 4 times per day   insulin glargine-yfgn  34 Units Subcutaneous BID   metoprolol tartrate  25 mg Per Tube BID   multivitamin with minerals  1 tablet Per Tube Daily   nicotine  14 mg Transdermal Daily   nystatin  5 mL  Oral QID   mouth rinse  15 mL Mouth Rinse Q2H   pantoprazole (PROTONIX) IV  40 mg Intravenous Q12H   PARoxetine  20 mg Per Tube Daily   pneumococcal 20-valent conjugate vaccine  0.5 mL Intramuscular Tomorrow-1000   QUEtiapine  50 mg Per Tube BID   Continuous Infusions:  ceFEPime (MAXIPIME) IV 2 g (10/20/23 0339)   dextrose     feeding supplement (OSMOLITE 1.5 CAL) 1,000 mL (10/19/23 0446)   PRN Meds:.acetaminophen **OR** acetaminophen, alum & mag hydroxide-simeth, bisacodyl, dextrose, fentaNYL (SUBLIMAZE) injection, hydrALAZINE, hydrOXYzine, labetalol, levalbuterol, loperamide HCl, melatonin, ondansetron **OR** ondansetron (ZOFRAN) IV, mouth rinse, oxyCODONE, polyethylene glycol  Recent Labs  Lab 10/15/23 0316 10/16/23 0212 10/18/23 0223 10/19/23 0321 10/20/23 0319  WBC 4.3 4.3 5.2 4.5 3.9*  HGB 8.7* 8.1* 9.1* 9.0* 8.3*  HCT 28.9* 26.0* 28.2* 28.3* 26.3*  PLT 223 249 299 363 361  MCV 96.0 94.9 93.1 92.5 92.6  MCH 28.9 29.6 30.0 29.4 29.2  MCHC 30.1 31.2 32.3 31.8 31.6  RDW 15.5 15.3 15.0 14.9 15.1  LYMPHSABS  --   --   --   --  0.6*  MONOABS  --   --   --   --  0.5  EOSABS  --   --   --   --  0.1  BASOSABS  --   --   --   --  0.0    Recent Labs  Lab 10/14/23 0314 10/15/23 0316 10/16/23 0212 10/16/23 1054 10/18/23 0223 10/19/23 0321 10/19/23 0441 10/20/23 0319  NA 137 141 139  --  136 135  --  136  K 4.6 4.4 4.3  --  4.4 5.2*  --  4.4  CL 97* 100 103  --  101 98  --  98  CO2 31 35* 31  --  28 28  --  31  ANIONGAP 9 6 5   --  7 9  --  7  GLUCOSE 227* 195* 175*  --  145* 90  --  135*  BUN 29* 27* 22*  --  21* 20  --  17  CREATININE 1.13 1.27* 0.98  --  1.08 1.07  --  0.97  AST  --   --   --  19  --   --   --   --   ALT  --   --   --  29  --   --   --   --  ALKPHOS  --   --   --  74  --   --   --   --   BILITOT  --   --   --  0.4  --   --   --   --   ALBUMIN  --   --   --  2.2*  --   --   --   --   CRP  --   --   --   --   --   --  6.1* 4.7*  PROCALCITON 0.21  0.18  --   --   --   --  <0.10 <0.10  MG 2.4 2.6* 2.4  --   --   --   --  2.5*  PHOS  --   --   --   --   --   --   --  3.5  CALCIUM 8.2* 8.3* 8.1*  --  8.4* 8.7*  --  8.6*      Recent Labs  Lab 10/14/23 0314 10/15/23 0316 10/16/23 0212 10/18/23 0223 10/19/23 0321 10/19/23 0441 10/20/23 0319  CRP  --   --   --   --   --  6.1* 4.7*  PROCALCITON 0.21 0.18  --   --   --  <0.10 <0.10  MG 2.4 2.6* 2.4  --   --   --  2.5*  CALCIUM 8.2* 8.3* 8.1* 8.4* 8.7*  --  8.6*    --------------------------------------------------------------------------------------------------------------- Lab Results  Component Value Date   CHOL 161 09/29/2023   HDL 28 (L) 09/29/2023   LDLCALC 95 09/29/2023   TRIG 219 (H) 09/30/2023   CHOLHDL 5.8 09/29/2023    Lab Results  Component Value Date   HGBA1C 10.8 (H) 09/29/2023     Micro Results Recent Results (from the past 240 hours)  Culture, Respiratory w Gram Stain     Status: None   Collection Time: 10/12/23  4:41 PM   Specimen: Tracheal Aspirate; Respiratory  Result Value Ref Range Status   Specimen Description TRACHEAL ASPIRATE  Final   Special Requests NONE  Final   Gram Stain   Final    ABUNDANT WBC PRESENT, PREDOMINANTLY PMN RARE GRAM POSITIVE COCCI IN PAIRS    Culture   Final    FEW Normal respiratory flora-no Staph aureus or Pseudomonas seen Performed at [facility], [address], [place], [place] [address]    Report Status 10/14/2023 FINAL  Final  Culture, blood (Routine X 2) w Reflex to ID Panel     Status: None   Collection Time: 10/13/23 12:24 AM   Specimen: BLOOD LEFT HAND  Result Value Ref Range Status   Specimen Description BLOOD LEFT HAND  Final   Special Requests   Final    BOTTLES DRAWN AEROBIC AND ANAEROBIC Blood Culture adequate volume   Culture   Final    NO GROWTH 5 DAYS Performed at [facility], [address] [place], [place] [address]    Report Status 10/18/2023 FINAL  Final  Culture, blood  (Routine X 2) w Reflex to ID Panel     Status: None   Collection Time: 10/13/23 12:26 AM   Specimen: BLOOD LEFT ARM  Result Value Ref Range Status   Specimen Description BLOOD LEFT ARM  Final   Special Requests   Final    BOTTLES DRAWN AEROBIC AND ANAEROBIC Blood Culture adequate volume   Culture   Final    NO GROWTH  5 DAYS Performed at [facility], [address] [place], [place] [address]    Report Status 10/18/2023 FINAL  Final    Radiology Reports  DG Swallowing Func-Speech Pathology Result Date: 10/19/2023 Table formatting from the original result was not included. Modified Barium Swallow Study Study completed by Rowe Robert, SLP Student Supervised and reviewed by Harlon Ditty MA CCC-SLP Patient Details Name: Jeremiah Stevens MRN: [id number] Date of Birth: Nov 14, 1975 Today's Date: 10/19/2023 HPI/PMH: HPI: Tigran Haynie is a 48 y.o. adult who identifies as male, admitted 09/26/23 with dysphagia x 3 days along with headaches and drooling. She was found to be hypoxic to 80s on room air. CTA showed multifocal PNA.  3/18 she had right facial droop, right sided weakness, dysarthria. MRI demonstrated acute right medullary infarct. Despite ongoing dysphagia (MBS showed moderately severe pharyngeal phase dysphagia and imaging showed unilateral bulging on right), pt insisted on eating. She reportedly ordered pizza overnight 3/18 and certainly aspirated, desaturated, ultimately suffered PEA arrest, 9 minutes ACLS, copious food material and emesis in oropharynx. Self extubated 3/21, reintubated and extubated 3/26, Trach on 3/31, ATC on 4/4. Clinical Impression: Clinical Impression: Pt presents with a severe oropharyngeal dysphagia (DIGEST Score: 4) primarily characterized by chronic and gross silent aspiration of all consistencies tested due to minimal to no pharyngeal clearance of bolus trials.      Multiple hard cough and throat clears and suctioning were required for clearing bolus  samples out of pharynx. Oral transit was mostly brisk with trace residue on tongue. Swallow inititation was primarily at level of pyriforms. Silent aspiration events were a result of minimal transit of bolus through the pharynx and UES. Pharyngeal stripping wave was not present and hyolaryngeal excursion was minimal. Epiglottic inversion was not present, with only minimal movement observed. Passage through UES was very minimal for majority of consistencies but not present during puree. All these components resulted in minimal to no pharyngeal clearance, leading to gross aspiration of consistencies with no immediate cough response from pt. Pt required verbal cueing to perform hard coughs and throat clears; Multiple hard cough and throat clears and suctioning were required for clearing bolus samples out of pharynx. Coughing and throat clearing appeared to not be effective for clearing deep aspiration but only for more penetrated material near laryngeal vestibule opening. Pt required lots of suctioning for puree trial due to no pharygneal clearance of bolus. Right and left head turns, chin tuck, and effortful swallow were utilized but were not effective for improving swallowing efficiency or safety.     Recommend continued NPO status due to severe risk for aspirating large volumes of any bolus consistency. Pt can have ice chips but only after oral care; pt must use a slow rate and small bites to optimize airway safety. SLP will f/u to monitor pt's tolerance of ice chips and provide exercises for rehabilitating pharyngeal swallow. Factors that may increase risk of adverse event in presence of aspiration Rubye Oaks & Clearance Coots 2021): Factors that may increase risk of adverse event in presence of aspiration Rubye Oaks & Clearance Coots 2021): Presence of tubes (ETT, trach, NG, etc.); Frequent aspiration of large volumes; Poor general health and/or compromised immunity Recommendations/Plan: Swallowing Evaluation Recommendations Swallowing  Evaluation Recommendations Recommendations: NPO; Ice chips PRN after oral care Medication Administration: Via alternative means Swallowing strategies  : Slow rate; Small bites/sips Postural changes: Position pt fully upright for meals; Stay upright 30-60 min after meals Oral care recommendations: Oral care BID (2x/day) Caregiver Recommendations: Have oral  suction available Treatment Plan Treatment Plan Treatment recommendations: Therapy as outlined in treatment plan below Follow-up recommendations: Follow physicians's recommendations for discharge plan and follow up therapies Treatment frequency: Min 2x/week Treatment duration: 2 weeks Recommendations Recommendations for follow up therapy are one component of a multi-disciplinary discharge planning process, led by the attending physician.  Recommendations may be updated based on patient status, additional functional criteria and insurance authorization. Assessment: Orofacial Exam: No data recorded Anatomy: Anatomy: WFL (Trach) Boluses Administered: Boluses Administered Boluses Administered: Thin liquids (Level 0); Mildly thick liquids (Level 2, nectar thick); Moderately thick liquids (Level 3, honey thick)  Oral Impairment Domain: Oral Impairment Domain Lip Closure: No labial escape Tongue control during bolus hold: Posterior escape of less than half of bolus Bolus preparation/mastication: -- (Not tested.) Bolus transport/lingual motion: Brisk tongue motion Oral residue: Trace residue lining oral structures Location of oral residue : Tongue Initiation of pharyngeal swallow : Pyriform sinuses  Pharyngeal Impairment Domain: Pharyngeal Impairment Domain Soft palate elevation: Trace column of contrast or air between SP and PW Laryngeal elevation: Minimal superior movement of thyroid cartilage with minimal approximation of arytenoids to epiglottic petiole Anterior hyoid excursion: Partial anterior movement Epiglottic movement: No inversion Laryngeal vestibule closure:  None, wide column air/contrast in laryngeal vestibule Pharyngeal stripping wave : Absent Pharyngeal contraction (A/P view only): N/A Pharyngoesophageal segment opening: No distension with total obstruction of flow Tongue base retraction: Narrow column of contrast or air between tongue base and PPW Pharyngeal residue: Minimal to no pharyngeal clearance Location of pharyngeal residue: Diffuse (>3 areas)  Esophageal Impairment Domain: Esophageal Impairment Domain Esophageal clearance upright position: -- (Not tested.) Pill: No data recorded Penetration/Aspiration Scale Score: Penetration/Aspiration Scale Score 8.  Material enters airway, passes BELOW cords without attempt by patient to eject out (silent aspiration) : Thin liquids (Level 0); Mildly thick liquids (Level 2, nectar thick); Moderately thick liquids (Level 3, honey thick); Puree Compensatory Strategies: Compensatory Strategies Compensatory strategies: Yes Effortful swallow: Ineffective Ineffective Effortful Swallow: Puree Chin tuck: Ineffective Ineffective Chin Tuck: Puree Left head turn: Ineffective Ineffective Left Head Turn: Moderately thick liquid (Level 3, honey thick) Right head turn: Ineffective Ineffective Right Head Turn: Mildly thick liquid (Level 2, nectar thick)   General Information: Caregiver present: No  Diet Prior to this Study: NPO; Cortrak/Small bore NG tube   Temperature : Normal   Respiratory Status: WFL   Supplemental O2: Trach Collar   History of Recent Intubation: Yes  Behavior/Cognition: Alert; Cooperative Self-Feeding Abilities: Able to self-feed Baseline vocal quality/speech: Aphonic Volitional Cough: Able to elicit Volitional Swallow: Able to elicit Exam Limitations: No limitations Goal Planning: Prognosis for improved oropharyngeal function: Guarded Barriers to Reach Goals: Severity of deficits No data recorded No data recorded Consulted and agree with results and recommendations: Patient Pain: Pain Assessment Pain Assessment:  No/denies pain End of Session: Start Time:SLP Start Time (ACUTE ONLY): 1335 Stop Time: SLP Stop Time (ACUTE ONLY): 1359 Time Calculation:SLP Time Calculation (min) (ACUTE ONLY): 24 min Charges: No data recorded SLP visit diagnosis: SLP Visit Diagnosis: Dysphagia, oropharyngeal phase (R13.12) Past Medical History: Past Medical History: Diagnosis Date  Cancer (HCC)   seizures  Diabetes (HCC)   Diabetes mellitus without complication (HCC)   Heartburn   occasional; OTC as needed  Herpes genitalis in men   HIV (human immunodeficiency virus infection) (HCC)   HIV disease (HCC)   HTN (hypertension)   Hyperlipidemia   Hypertension   under control with med., has been on med. x 1 yr.  Lateral  malleolar fracture 09/02/2013  left  Migraines   Tear of deltoid ligament of left ankle 09/02/2013  Type 2 diabetes mellitus with hyperosmolar nonketotic hyperglycemia (HCC) 02/10/2020 Past Surgical History: Past Surgical History: Procedure Laterality Date  NO PAST SURGERIES    ORIF ANKLE FRACTURE Left 09/13/2013  Procedure: OPEN REDUCTION INTERNAL FIXATION (ORIF) LEFT LATERAL MALLEOLUS ANKLE FRACTURE ;  Surgeon: Dannielle Huh, MD;  Location: [facility];  Service: Orthopedics;  Laterality: Left;  TRACHEOSTOMY TUBE PLACEMENT N/A 10/11/2023  Procedure: CREATION, TRACHEOSTOMY;  Surgeon: Serena Colonel, MD;  Location: [facility] OR;  Service: ENT;  Laterality: N/A; [place], Riley Nearing 10/19/2023, 3:01 PM  DG Chest Port 1 View Result Date: 10/19/2023 CLINICAL DATA:  Shortness of breath EXAM: PORTABLE CHEST 1 VIEW COMPARISON:  Six days ago FINDINGS: Tracheostomy tube which is well seated. An enteric tube at least reaches the diaphragm. Stable borderline heart size. Accentuated vessels in the setting of low lung volumes. Mild streaky density at the lung bases. No effusion or pneumothorax. IMPRESSION: Low volume chest with mild atelectatic type change at the bases. Electronically Signed   By: Tiburcio Pea M.D.   On: 10/19/2023 07:52       Signature  -   Susa Raring M.D on 10/20/2023 at 8:02 AM   -  To page go to [email or web address]

## 2023-10-20 NOTE — Progress Notes (Addendum)
 Speech Language Pathology Treatment: Dysphagia  Patient Details Name: Jeremiah Stevens MRN: [id number] DOB: 01-12-1976 Today's Date: 10/20/2023 Time: 1320-1350 SLP Time Calculation (min) (ACUTE ONLY): 30 min  Assessment / Plan / Recommendation Clinical Impression  Therapy session to introduce beneficial exercises to target pharyngeal strength and airway protection. Pt up in chair, tolerating PMSV all waking hours. Wet vocal quality at baseline. SLP encouraged pt to listen to her voice and if wet, try to swallow and expectorate. Pt needs as many opportunities to swallow as possible. Pt able to complete 30 repetitions of EMST set to 25 cm H20 with moderate effort. SLP simply reiterated severity of dysphagia, pts inability to swallow without liquid or food going to lungs. Need for patience over a long period of time to rehabilitate swallowing. Pt followed SLP instructions for one single piece of ice a time with multiple effortful swallows. Pt had immediate hard coughing after every two swallows, pt used oral suction to clear expectorated water and mucous. Pt may have ice, one piece at a time, with effortful swallows to provide opportunities to initiate swallow. Prognosis for oral intake in the coming weeks very poor.    HPI HPI: Jeremiah Stevens is a 48 y.o. adult who identifies as male, admitted 09/26/23 with dysphagia x 3 days along with headaches and drooling. She was found to be hypoxic to 80s on room air. CTA showed multifocal PNA.  3/18 she had right facial droop, right sided weakness, dysarthria. MRI demonstrated acute right medullary infarct. Despite ongoing dysphagia (MBS showed moderately severe pharyngeal phase dysphagia and imaging showed unilateral bulging on right), pt insisted on eating. She reportedly ordered pizza overnight 3/18 and certainly aspirated, desaturated, ultimately suffered PEA arrest, 9 minutes ACLS, copious food material and emesis in oropharynx. Self extubated 3/21,  reintubated and extubated 3/26, Trach on 3/31, ATC on 4/4.      SLP Plan  Continue with current plan of care      Recommendations for follow up therapy are one component of a multi-disciplinary discharge planning process, led by the attending physician.  Recommendations may be updated based on patient status, additional functional criteria and insurance authorization.    Recommendations  Diet recommendations: NPO;Other(comment) (ice chips)      Patient may use Passy-Muir Speech Valve: During all waking hours (remove during sleep) PMSV Supervision: Intermittent MD: Please consider changing trach tube to : Smaller size;Cuffless               Frequent or constant Supervision/Assistance Dysphagia, oropharyngeal phase (R13.12)     Continue with current plan of care     Tania Steinhauser, Riley Nearing  10/20/2023, 3:01 PM

## 2023-10-20 NOTE — Progress Notes (Signed)
 Occupational Therapy Treatment Patient Details Name: Jeremiah Stevens MRN: [id number] DOB: 1976-04-09 Today's Date: 10/20/2023   History of present illness 48 yo pt admitted to [facility] 09/26/23 for difficulty swallowing. 3/19 PEA, ROSC after 2 min, then bradycardic arrest 1 min later, CPR resumed, pt intubated, and ROSC 6 min later. Seizure-like activity also noted briefly after ROSC. 3/20 pt transferred to [facility]. 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 3/21 self extubated and reintubated. 3/31 trach. PMHx: HIV/AIDS, HTN, herpes, migraine, depression, DM II, polysubstance use, and seizure.   OT comments  Pt progressing toward goals this session, able to perform ADLs with up to min -max A for ADLs, CGA for bed mobility and min +2 for transfers with RW. Pt then able to take 5-6 steps forward with chair follow. Pt SpO2 down to 78% on 6l 28% FiO2 trach collar. Pt needing x1 seated rest break. Pt presenting with impairments listed below, will follow acutely. Patient will benefit from continued inpatient follow up therapy, <3 hours/day to maximize safety/ind with ADL/functional mobility.       If plan is discharge home, recommend the following:  A lot of help with walking and/or transfers;A lot of help with bathing/dressing/bathroom;Assistance with cooking/housework;Direct supervision/assist for medications management;Direct supervision/assist for financial management;Assist for transportation;Help with stairs or ramp for entrance   Equipment Recommendations  Wheelchair (measurements OT);Wheelchair cushion (measurements OT);BSC/3in1;Hospital bed    Recommendations for Other Services Speech consult    Precautions / Restrictions Precautions Precautions: Fall;Other (comment) Recall of Precautions/Restrictions: Intact Precaution/Restrictions Comments: trach, cortrak, primofit, PMV Restrictions Weight Bearing Restrictions Per Provider Order: No       Mobility Bed  Mobility Overal bed mobility: Needs Assistance Bed Mobility: Supine to Sit Rolling: Contact guard assist, Used rails              Transfers Overall transfer level: Needs assistance Equipment used: Rolling walker (2 wheels) Transfers: Sit to/from Stand, Bed to chair/wheelchair/BSC Sit to Stand: Min assist, +2 safety/equipment           General transfer comment: MinA for slight boost, x2 for safety with lines. No physical assist when taking steps to recliner     Balance Overall balance assessment: Needs assistance Sitting-balance support: No upper extremity supported, Feet supported Sitting balance-Leahy Scale: Fair     Standing balance support: Bilateral upper extremity supported, During functional activity, Reliant on assistive device for balance Standing balance-Leahy Scale: Poor Standing balance comment: reliant on RW                           ADL either performed or assessed with clinical judgement   ADL Overall ADL's : Needs assistance/impaired                 Upper Body Dressing : Minimal assistance;Sitting Upper Body Dressing Details (indicate cue type and reason): donning clean gown     Toilet Transfer: Minimal assistance;+2 for safety/equipment;Rolling walker (2 wheels);Ambulation   Toileting- Clothing Manipulation and Hygiene: Maximal assistance Toileting - Clothing Manipulation Details (indicate cue type and reason): post pericare, standing            Extremity/Trunk Assessment Upper Extremity Assessment Upper Extremity Assessment: Left hand dominant RUE Deficits / Details: 3/5 shoulder ROM, able to bring hand to mouth with elbow and wrist supported RUE Coordination: decreased gross motor;decreased fine motor   Lower Extremity Assessment Lower Extremity Assessment: Defer to PT evaluation  Vision   Vision Assessment?: No apparent visual deficits   Perception Perception Perception: Within Functional Limits   Praxis  Praxis Praxis: WFL   Communication Communication Communication: Impaired Factors Affecting Communication: Trach/intubated;Passey - Muir valve   Cognition Arousal: Alert Behavior During Therapy: WFL for tasks assessed/performed                                 Following commands: Intact Following commands impaired: Follows multi-step commands with increased time, Only follows one step commands consistently      Cueing   Cueing Techniques: Verbal cues, Tactile cues  Exercises      Shoulder Instructions       General Comments HR up to 110-120, SPO2 down to 78% at lowest on 6L 28% FiO2 trach collar, however poor wave pleth and incr to 90s once seated/resting with good pleth    Pertinent Vitals/ Pain       Pain Assessment Pain Assessment: No/denies pain  Home Living                                          Prior Functioning/Environment              Frequency  Min 2X/week        Progress Toward Goals  OT Goals(current goals can now be found in the care plan section)  Progress towards OT goals: Progressing toward goals  Acute Rehab OT Goals Patient Stated Goal: none stated OT Goal Formulation: With patient Time For Goal Achievement: 11/03/23 Potential to Achieve Goals: Good ADL Goals Pt Will Perform Upper Body Dressing: sitting;with min assist Pt Will Perform Lower Body Dressing: sit to/from stand;sitting/lateral leans;with min assist  Plan      Co-evaluation    PT/OT/SLP Co-Evaluation/Treatment: Yes Reason for Co-Treatment: For patient/therapist safety;To address functional/ADL transfers PT goals addressed during session: Mobility/safety with mobility;Balance;Proper use of DME OT goals addressed during session: ADL's and self-care;Proper use of Adaptive equipment and DME;Strengthening/ROM      AM-PAC OT "6 Clicks" Daily Activity     Outcome Measure   Help from another person eating meals?: A Lot Help from another  person taking care of personal grooming?: A Little Help from another person toileting, which includes using toliet, bedpan, or urinal?: A Lot Help from another person bathing (including washing, rinsing, drying)?: A Lot Help from another person to put on and taking off regular upper body clothing?: A Lot Help from another person to put on and taking off regular lower body clothing?: A Lot 6 Click Score: 13    End of Session Equipment Utilized During Treatment: Oxygen (6L 28% FiO2)  OT Visit Diagnosis: Unsteadiness on feet (R26.81);Muscle weakness (generalized) (M62.81) Symptoms and signs involving cognitive functions: Cerebral infarction   Activity Tolerance Patient tolerated treatment well   Patient Left in chair;with call bell/phone within reach   Nurse Communication Mobility status;Need for lift equipment;Precautions        Time: 1610-9604 OT Time Calculation (min): 31 min  Charges: OT General Charges $OT Visit: 1 Visit OT Treatments $Self Care/Home Management : 8-22 mins  Carver Fila, OTD, OTR/L SecureChat Preferred Acute Rehab [phone]   Dalphine Handing 10/20/2023, 3:34 PM

## 2023-10-21 ENCOUNTER — Inpatient Hospital Stay: Payer: Self-pay | Admitting: Internal Medicine

## 2023-10-21 DIAGNOSIS — J189 Pneumonia, unspecified organism: Secondary | ICD-10-CM | POA: Diagnosis not present

## 2023-10-21 LAB — MAGNESIUM: Magnesium: 2.4 mg/dL (ref 1.7–2.4)

## 2023-10-21 LAB — BASIC METABOLIC PANEL WITH GFR
Anion gap: 10 (ref 5–15)
BUN: 13 mg/dL (ref 6–20)
CO2: 30 mmol/L (ref 22–32)
Calcium: 8.9 mg/dL (ref 8.9–10.3)
Chloride: 99 mmol/L (ref 98–111)
Creatinine, Ser: 0.92 mg/dL (ref 0.61–1.24)
GFR, Estimated: >60 mL/min (ref >60)
Glucose, Bld: 91 mg/dL (ref 70–99)
Potassium: 4.4 mmol/L (ref 3.5–5.1)
Sodium: 139 mmol/L (ref 135–145)

## 2023-10-21 LAB — CBC WITH DIFFERENTIAL/PLATELET
Abs Immature Granulocytes: 0.03 10*3/uL (ref 0.00–0.07)
Basophils Absolute: 0 10*3/uL (ref 0.0–0.1)
Basophils Relative: 0 %
Eosinophils Absolute: 0.1 10*3/uL (ref 0.0–0.5)
Eosinophils Relative: 2 %
HCT: 27.8 % — ABNORMAL LOW (ref 39.0–52.0)
Hemoglobin: 8.8 g/dL — ABNORMAL LOW (ref 13.0–17.0)
Immature Granulocytes: 1 %
Lymphocytes Relative: 13 %
Lymphs Abs: 0.5 10*3/uL — ABNORMAL LOW (ref 0.7–4.0)
MCH: 29.3 pg (ref 26.0–34.0)
MCHC: 31.7 g/dL (ref 30.0–36.0)
MCV: 92.7 fL (ref 80.0–100.0)
Monocytes Absolute: 0.4 10*3/uL (ref 0.1–1.0)
Monocytes Relative: 12 %
Neutro Abs: 2.7 10*3/uL (ref 1.7–7.7)
Neutrophils Relative %: 72 %
Platelets: 392 10*3/uL (ref 150–400)
RBC: 3 MIL/uL — ABNORMAL LOW (ref 4.22–5.81)
RDW: 15.2 % (ref 11.5–15.5)
WBC: 3.7 10*3/uL — ABNORMAL LOW (ref 4.0–10.5)
nRBC: 1.1 % — ABNORMAL HIGH (ref 0.0–0.2)

## 2023-10-21 LAB — GLUCOSE, CAPILLARY
Glucose-Capillary: 97 mg/dL (ref 70–99)
Glucose-Capillary: 86 mg/dL (ref 70–99)
Glucose-Capillary: 86 mg/dL (ref 70–99)
Glucose-Capillary: 74 mg/dL (ref 70–99)
Glucose-Capillary: 166 mg/dL — ABNORMAL HIGH (ref 70–99)
Glucose-Capillary: 171 mg/dL — ABNORMAL HIGH (ref 70–99)

## 2023-10-21 LAB — C-REACTIVE PROTEIN: CRP: 3.7 mg/dL — ABNORMAL HIGH (ref <1.0)

## 2023-10-21 LAB — PHOSPHORUS: Phosphorus: 2.8 mg/dL (ref 2.5–4.6)

## 2023-10-21 LAB — PROCALCITONIN: Procalcitonin: <0.1 ng/mL

## 2023-10-21 NOTE — Plan of Care (Signed)
  Problem: Metabolic: Goal: Ability to maintain appropriate glucose levels will improve Outcome: Progressing   Problem: Nutritional: Goal: Maintenance of adequate nutrition will improve Outcome: Progressing   Problem: Skin Integrity: Goal: Risk for impaired skin integrity will decrease Outcome: Progressing   Problem: Tissue Perfusion: Goal: Adequacy of tissue perfusion will improve Outcome: Progressing   Problem: Education: Goal: Knowledge of General Education information will improve Description: Including pain rating scale, medication(s)/side effects and non-pharmacologic comfort measures Outcome: Progressing   Problem: Clinical Measurements: Goal: Ability to maintain clinical measurements within normal limits will improve Outcome: Progressing Goal: Will remain free from infection Outcome: Progressing Goal: Diagnostic test results will improve Outcome: Progressing Goal: Respiratory complications will improve Outcome: Progressing Goal: Cardiovascular complication will be avoided Outcome: Progressing   Problem: Activity: Goal: Risk for activity intolerance will decrease Outcome: Progressing   Problem: Nutrition: Goal: Adequate nutrition will be maintained Outcome: Progressing   Problem: Coping: Goal: Level of anxiety will decrease Outcome: Progressing   Problem: Elimination: Goal: Will not experience complications related to bowel motility Outcome: Progressing Goal: Will not experience complications related to urinary retention Outcome: Progressing   Problem: Pain Managment: Goal: General experience of comfort will improve and/or be controlled Outcome: Progressing   Problem: Safety: Goal: Ability to remain free from injury will improve Outcome: Progressing   Problem: Skin Integrity: Goal: Risk for impaired skin integrity will decrease Outcome: Progressing   Problem: Activity: Goal: Ability to tolerate increased activity will improve Outcome:  Progressing   Problem: Clinical Measurements: Goal: Ability to maintain a body temperature in the normal range will improve Outcome: Progressing   Problem: Respiratory: Goal: Ability to maintain adequate ventilation will improve Outcome: Progressing Goal: Ability to maintain a clear airway will improve Outcome: Progressing   Problem: Education: Goal: Knowledge of disease or condition will improve Outcome: Progressing Goal: Knowledge of secondary prevention will improve (MUST DOCUMENT ALL) Outcome: Progressing   Problem: Ischemic Stroke/TIA Tissue Perfusion: Goal: Complications of ischemic stroke/TIA will be minimized Outcome: Progressing   Problem: Coping: Goal: Will verbalize positive feelings about self Outcome: Progressing Goal: Will identify appropriate support needs Outcome: Progressing

## 2023-10-21 NOTE — TOC Progression Note (Signed)
 Transition of Care [facility]) - Progression Note    Patient Details  Name: Jeremiah Stevens MRN: [id number] Date of Birth: 1975-09-01  [facility]) CM/SW Contact  Mearl Latin, LCSW Phone Number: 10/21/2023, 10:58 AM  Clinical Narrative:    CSW continuing to follow. SNF barriers remain in place.   Expected Discharge Plan: Skilled Nursing Facility Barriers to Discharge: English as a second language teacher, SNF Pending bed offer, Continued Medical Work up (new trach)  Expected Discharge Plan and Services In-house Referral: Clinical Social Work   Post Acute Care Choice: Skilled Nursing Facility Living arrangements for the past 2 months: Single Family Home                 DME Arranged: N/A DME Agency: NA                   Social Determinants of Health (SDOH) Interventions SDOH Screenings   Food Insecurity: No Food Insecurity (09/27/2023)  Housing: Low Risk  (09/27/2023)  Transportation Needs: Unmet Transportation Needs (09/27/2023)  Utilities: Not At Risk (09/27/2023)  Depression (PHQ2-9): Low Risk  (05/22/2020)  Tobacco Use: High Risk (10/11/2023)    Readmission Risk Interventions     No data to display

## 2023-10-21 NOTE — Plan of Care (Signed)
  Problem: Health Behavior/Discharge Planning: Goal: Ability to identify and utilize available resources and services will improve Outcome: Progressing Goal: Ability to manage health-related needs will improve Outcome: Progressing   Problem: Metabolic: Goal: Ability to maintain appropriate glucose levels will improve Outcome: Progressing   Problem: Nutritional: Goal: Maintenance of adequate nutrition will improve Outcome: Progressing Goal: Progress toward achieving an optimal weight will improve Outcome: Progressing   Problem: Skin Integrity: Goal: Risk for impaired skin integrity will decrease Outcome: Progressing   Problem: Tissue Perfusion: Goal: Adequacy of tissue perfusion will improve Outcome: Progressing   Problem: Education: Goal: Knowledge of General Education information will improve Description: Including pain rating scale, medication(s)/side effects and non-pharmacologic comfort measures Outcome: Progressing   Problem: Health Behavior/Discharge Planning: Goal: Ability to manage health-related needs will improve Outcome: Progressing   Problem: Clinical Measurements: Goal: Ability to maintain clinical measurements within normal limits will improve Outcome: Progressing Goal: Will remain free from infection Outcome: Progressing Goal: Diagnostic test results will improve Outcome: Progressing Goal: Respiratory complications will improve Outcome: Progressing Goal: Cardiovascular complication will be avoided Outcome: Progressing   Problem: Activity: Goal: Risk for activity intolerance will decrease Outcome: Progressing   Problem: Nutrition: Goal: Adequate nutrition will be maintained Outcome: Progressing   Problem: Coping: Goal: Level of anxiety will decrease Outcome: Progressing   Problem: Elimination: Goal: Will not experience complications related to bowel motility Outcome: Progressing Goal: Will not experience complications related to urinary  retention Outcome: Progressing   Problem: Pain Managment: Goal: General experience of comfort will improve and/or be controlled Outcome: Progressing   Problem: Safety: Goal: Ability to remain free from injury will improve Outcome: Progressing   Problem: Skin Integrity: Goal: Risk for impaired skin integrity will decrease Outcome: Progressing   Problem: Activity: Goal: Ability to tolerate increased activity will improve Outcome: Progressing   Problem: Clinical Measurements: Goal: Ability to maintain a body temperature in the normal range will improve Outcome: Progressing   Problem: Respiratory: Goal: Ability to maintain adequate ventilation will improve Outcome: Progressing Goal: Ability to maintain a clear airway will improve Outcome: Progressing   Problem: Education: Goal: Knowledge of disease or condition will improve Outcome: Progressing Goal: Knowledge of secondary prevention will improve (MUST DOCUMENT ALL) Outcome: Progressing Goal: Knowledge of patient specific risk factors will improve (DELETE if not current risk factor) Outcome: Progressing   Problem: Ischemic Stroke/TIA Tissue Perfusion: Goal: Complications of ischemic stroke/TIA will be minimized Outcome: Progressing   Problem: Coping: Goal: Will verbalize positive feelings about self Outcome: Progressing Goal: Will identify appropriate support needs Outcome: Progressing   Problem: Health Behavior/Discharge Planning: Goal: Ability to manage health-related needs will improve Outcome: Progressing Goal: Goals will be collaboratively established with patient/family Outcome: Progressing   Problem: Self-Care: Goal: Ability to participate in self-care as condition permits will improve Outcome: Progressing Goal: Verbalization of feelings and concerns over difficulty with self-care will improve Outcome: Progressing Goal: Ability to communicate needs accurately will improve Outcome: Progressing   Problem:  Nutrition: Goal: Risk of aspiration will decrease Outcome: Progressing Goal: Dietary intake will improve Outcome: Progressing   Problem: Nutrition: Goal: Dietary intake will improve Outcome: Progressing   Problem: Activity: Goal: Ability to tolerate increased activity will improve Outcome: Progressing   Problem: Respiratory: Goal: Ability to maintain a clear airway and adequate ventilation will improve Outcome: Progressing   Problem: Role Relationship: Goal: Method of communication will improve Outcome: Progressing

## 2023-10-21 NOTE — Progress Notes (Signed)
 Speech Language Pathology Treatment: Dysphagia  Patient Details Name: Dimitriy Carreras MRN: [id number] DOB: [birth year] Today's Date: 10/21/2023 Time: 4010-2725 SLP Time Calculation (min) (ACUTE ONLY): 19 min  Assessment / Plan / Recommendation Clinical Impression  Pt up in chair again today, PMSV in place. Pt appears drowsy, hiccups constantly. Tells me her father visited her today and we talked about that for a bit. Pt needs cues at times to increase volume and take deeper breaths for clear speech. Pt completed 30 repetitions of EMST at 15 cm H20, decreased from 25 yesterday. Pt seemed weaker in general. She consumed 3 ice chips. Though SLP was cueing pt for hard, effortful swallows, SLP was only able to observe one instance of hyolaryngeal elevation indicative of any success with increased effort or actual swallow. Pt had immediate and delayed coughing and wet vocal quality with oral expectoration. There is again no indication pt is improving. In fact, pt appears weaker today. This is a profound dysphagia unlikely to resolve in the short term. Pt shows poor endurance with therapy. Would need to be attempting to swallow hard hundreds of time a day to achieve improvement. I am unsure if Sheralyn Boatman has been able to initiate any effortful swallows of secretions or ice independently given that she was unable to do so today with max cues. Will continue efforts.    HPI HPI: Trase Bunda is a 48 y.o. adult who identifies as male, admitted 09/26/23 with dysphagia x 3 days along with headaches and drooling. She was found to be hypoxic to 80s on room air. CTA showed multifocal PNA.  3/18 she had right facial droop, right sided weakness, dysarthria. MRI demonstrated acute right medullary infarct. Despite ongoing dysphagia (MBS showed moderately severe pharyngeal phase dysphagia and imaging showed unilateral bulging on right), pt insisted on eating. She reportedly ordered pizza overnight 3/18 and certainly  aspirated, desaturated, ultimately suffered PEA arrest, 9 minutes ACLS, copious food material and emesis in oropharynx. Self extubated 3/21, reintubated and extubated 3/26, Trach on 3/31, ATC on 4/4.      SLP Plan  Continue with current plan of care      Recommendations for follow up therapy are one component of a multi-disciplinary discharge planning process, led by the attending physician.  Recommendations may be updated based on patient status, additional functional criteria and insurance authorization.    Recommendations  Diet recommendations: NPO      Patient may use Passy-Muir Speech Valve: During all waking hours (remove during sleep) PMSV Supervision: Intermittent MD: Please consider changing trach tube to : Smaller size;Cuffless           Oral care QID   Frequent or constant Supervision/Assistance       Continue with current plan of care     Dorothy Polhemus, Riley Nearing  10/21/2023, 2:25 PM

## 2023-10-21 NOTE — Progress Notes (Addendum)
 PROGRESS NOTE   Jeremiah Stevens  ZOX:[id number]    DOB: 12-04-1975    DOA: 09/26/2023  PCP: Judyann Munson, MD   I have briefly reviewed patients previous medical records in [facility].  Chief Complaint  Patient presents with   Dysphagia    Brief Hospital Course:  48 y.o. adult who with medical history including but not limited to DM2, seizures, HIV AIDS untreated, cryptococcal disease, obesity, polysubstance abuse, prior CVA. She was admitted 09/26/23 with dysphagia x 3 days along with headaches and drooling. He was found to be hypoxic to 80s on room air. CTA was negative for PE but showed multifocal PNA. He was started on abx and BD's. Biktarvy was continued and ID was consulted. 3/18 He had right facial droop, right sided weakness, dysarthria. Neurology was consulted and recommended CTA and MRI. MRI demonstrated acute right medullary infarct. Despite ongoing dysphagia (MBS showed moderately severe pharyngeal phase dysphagia and imaging showed unilateral bulging on right), pt insisted on eating. He reportedly ordered pizza overnight 3/18 and certainly aspirated. He had threatened to leave AMA despite stroke workup ongoing. 3/19, he had hypoxia for which rapid response was called. While being assess, he continued to desaturate down into the 60s and then 40s. He required BVM and was transferred to the ICU for further evaluation and management. Upon arrival in the ICU, she had ongoing desaturations and bradycardia before developing PEA arrest. He had 9 minutes ACLS prior to ROSC including epi x 4 and bicarb x 2. During intubation attempt, she had copious food material and emesis in her oropharynx. Visualization of vocal cords was difficult due to amount of food material and emesis despite suctioning. On 2nd attempt, ETT was passed successfully and saturations improved shortly thereafter. There was grand mal seizure activity noted after intubation that abated after Midazolam  administration.  Significant hospital events:  3/16 admit 3/18 neuro consult, found to have acute CVA 3/19 Ongoing aspiration despite obvious dysphagia, was planning to leave AMA but sister talked pt into staying. then had a massive aspiration event leading to worse hypoxia and PEA arrest. Txf to [facility] 3/20 PSV cumulative total of 5-6 hrs, following some commands/ purposefully 3/21 self extubated, was reintubated 3/23 Failed SBT due to WOB 3/24 Failed SBT due to apnea 3/25 Tolerated PS >4 hours 3/26: Intubated. 3/31: S/p tracheostomy with ENT. 4/4: trach collar starting 1200  4/5 cont on trach collar  4/7: Care transferred from [facility] to [facility] 4/8: Modified barium swallow.  Failed.  NPO.   Assessment & Plan:   Acute hypoxic resp failure - S/p trach - Multifocal Aspiration PNA - Aspiration event- Hx tobacco use - Dysphagia Tolerated trach collar trial off the vent, with mildly increased FiO2 requirement 25-40% on 4/6 . S/P a one-time dose of IV Lasix trial to improve the respiratory status on 4/6.   Currently without tachypnea, saturating in the high 90s on trach collar FiO2 35%, 10 L/min.  Discussed with RN at bedside to attempt to wean. Tracheostomy management per PCCM and ENT Dr. Pollyann Kennedy.  Case discussed with Dr. Pollyann Kennedy on 10/20/2023, has deferred trach changes to RT.  He will be available as needed. Patient currently has #8 Shiley cuffed trach as of 10/20/2023   -GPC on trach asp 10/12/23, on cefepime #7/7 -SLP following for dysphagia and PMV valve use. -added CPT  -Unfortunately does not have LTAC benefits    Dysphagia.  Still on NG tube feeds.  Speech therapy following unfortunately failed another modified barium  swallow on 10/19/2023.  Will monitor for another few days if continues to fail swallow evaluations will require a PEG tube.  Patient is sitting up in the recliner for several hours now, will try to have speech therapy reevaluate her once she is sitting up in recliner in hopes of her  doing slightly better on her swallow eval.  If all fails then we will proceed to PEG tube in the next few days.   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  Recommending LTAC. -ASA, statin, seen by neurology this admission.   Acute encephalopathy Anxiety, depression  P -minimize CNS depressing meds -delirium precautions  -Encephalopathy has resolved.   PEA arrest  -?Sz like activity following, stable echocardiogram with preserved EF no wall motion abnormality. P -supportive care    HFpEF EF 65% Hx HTN HLD -statin, dilt, metop PRN hydral and PRN labetalol  Blood pressure is controlled.   Normocytic anemia -Follow intermittent CBC.  Hemoglobin stable in the 8-9 g range.   HIV / AIDS Hx +cryptococcal antigen  P -Seen by ID currently on following regimen per ID, post discharge follow-up with ID in the office.  Previously untreated.  Counseled on compliance.  -Continue p.o. fluconazole 400 mg daily on dischare QTc 385 -Continue Descovy/Tivicay  -Continue atovaquone for PJP prophylaxis  DM2, with hyperglycemia, on steroids -cont basal + SSI.  Good control on current regimen.  CBG (last 3)  Recent Labs    10/20/23 2012 10/20/23 2319 10/21/23 0328  GLUCAP 143* 174* 97   Lab Results  Component Value Date   HGBA1C 10.8 (H) 09/29/2023    Code status -Full  Medical noncompliance. As per chart review and detailed discussion with overnight ICU RN, patient has medical decision making capacity but is very noncompliant-pulled out feeding tube, attempts to pull out tracheostomy, sickly counseled on being compliant, explained the risk of sudden aspiration, respiratory arrest, death.      Body mass index is 34.59 kg/m.  Obesity.  Follow with PCP.    Nutritional Status Nutrition Problem: Increased nutrient needs Etiology: chronic illness Signs/Symptoms: estimated needs Interventions: Tube feeding,  Prostat   DVT prophylaxis: SCDs Start: 09/27/23 0018.  Lovenox   Code Status: Full Code:  Family Communication: Father Sheralyn Boatman on 10/21/23 - [phone] Disposition:  Status is: Inpatient  Consultants:   ENT Psychiatry PCCM Neurology Infectious disease  Subjective:   Patient in bed, appears comfortable, denies any headache, no fever, no chest pain or pressure, no shortness of breath , no abdominal pain. No focal weakness.  Sat up in the recliner for several hours on 10/20/2023.  Overall feeling better.   Objective:   Vitals:   10/20/23 2142 10/21/23 0049 10/21/23 0500 10/21/23 0600  BP: (!) 153/106 (!) 121/56  (!) 145/84  Pulse: (!) 106 98  94  Resp:  18  20  Temp:  98.9 F (37.2 C)  98.6 F (37 C)  TempSrc:  Oral  Oral  SpO2:    96%  Weight:   115.7 kg   Height:        Awake Alert, No new F.N deficits, NG tube in place, tracheostomy site stable with trach collar [place].AT,PERRAL Supple Neck, No JVD,   Symmetrical Chest wall movement, Good air movement bilaterally, CTAB RRR,No Gallops, Rubs or new Murmurs,  +ve B.Sounds, Abd Soft, No tenderness,   No Cyanosis, Clubbing or edema   Data Reviewed:   I have personally  reviewed following labs and imaging studies    Data Review:   Inpatient Medications  Scheduled Meds:  aspirin  81 mg Per Tube Daily   atorvastatin  20 mg Per Tube QHS   atovaquone  1,500 mg Per Tube Q lunch   Chlorhexidine Gluconate Cloth  6 each Topical Daily   clopidogrel  75 mg Per Tube Daily   diltiazem  60 mg Per Tube Q6H   dolutegravir  50 mg Per Tube Daily   emtricitabine-tenofovir  1 tablet Per Tube Daily   enoxaparin (LOVENOX) injection  50 mg Subcutaneous Q24H   feeding supplement (PROSource TF20)  60 mL Per Tube Daily   fiber supplement (BANATROL TF)  60 mL Per Tube BID   fluconazole  400 mg Per Tube Daily   free water  100 mL Per Tube Q4H   Gerhardt's butt cream   Topical BID   insulin aspart  0-20 Units Subcutaneous Q4H   insulin  aspart  10 Units Subcutaneous 4 times per day   insulin glargine-yfgn  34 Units Subcutaneous BID   metoprolol tartrate  25 mg Per Tube BID   multivitamin with minerals  1 tablet Per Tube Daily   nicotine  14 mg Transdermal Daily   nystatin  5 mL Oral QID   mouth rinse  15 mL Mouth Rinse Q2H   pantoprazole (PROTONIX) IV  40 mg Intravenous Q12H   PARoxetine  20 mg Per Tube Daily   pneumococcal 20-valent conjugate vaccine  0.5 mL Intramuscular Tomorrow-1000   QUEtiapine  50 mg Per Tube BID   Continuous Infusions:  ceFEPime (MAXIPIME) IV 2 g (10/21/23 0328)   dextrose     feeding supplement (OSMOLITE 1.5 CAL) 1,000 mL (10/20/23 1559)   PRN Meds:.acetaminophen **OR** acetaminophen, alum & mag hydroxide-simeth, bisacodyl, dextrose, fentaNYL (SUBLIMAZE) injection, hydrALAZINE, hydrOXYzine, labetalol, levalbuterol, loperamide HCl, melatonin, ondansetron **OR** ondansetron (ZOFRAN) IV, mouth rinse, oxyCODONE, polyethylene glycol  Recent Labs  Lab 10/16/23 0212 10/18/23 0223 10/19/23 0321 10/20/23 0319 10/21/23 0316  WBC 4.3 5.2 4.5 3.9* 3.7*  HGB 8.1* 9.1* 9.0* 8.3* 8.8*  HCT 26.0* 28.2* 28.3* 26.3* 27.8*  PLT 249 299 363 361 392  MCV 94.9 93.1 92.5 92.6 92.7  MCH 29.6 30.0 29.4 29.2 29.3  MCHC 31.2 32.3 31.8 31.6 31.7  RDW 15.3 15.0 14.9 15.1 15.2  LYMPHSABS  --   --   --  0.6* 0.5*  MONOABS  --   --   --  0.5 0.4  EOSABS  --   --   --  0.1 0.1  BASOSABS  --   --   --  0.0 0.0    Recent Labs  Lab 10/15/23 0316 10/16/23 0212 10/16/23 1054 10/18/23 0223 10/19/23 0321 10/19/23 0441 10/20/23 0319 10/21/23 0316  NA 141 139  --  136 135  --  136 139  K 4.4 4.3  --  4.4 5.2*  --  4.4 4.4  CL 100 103  --  101 98  --  98 99  CO2 35* 31  --  28 28  --  31 30  ANIONGAP 6 5  --  7 9  --  7 10  GLUCOSE 195* 175*  --  145* 90  --  135* 91  BUN 27* 22*  --  21* 20  --  17 13  CREATININE 1.27* 0.98  --  1.08 1.07  --  0.97 0.92  AST  --   --  19  --   --   --   --   --  ALT  --    --  29  --   --   --   --   --   ALKPHOS  --   --  78  --   --   --   --   --   BILITOT  --   --  0.4  --   --   --   --   --   ALBUMIN  --   --  2.2*  --   --   --   --   --   CRP  --   --   --   --   --  6.1* 4.7* 3.7*  PROCALCITON 0.18  --   --   --   --  <0.10 <0.10 <0.10  MG 2.6* 2.4  --   --   --   --  2.5* 2.4  PHOS  --   --   --   --   --   --  3.5 2.8  CALCIUM 8.3* 8.1*  --  8.4* 8.7*  --  8.6* 8.9      Recent Labs  Lab 10/15/23 0316 10/16/23 0212 10/18/23 0223 10/19/23 0321 10/19/23 0441 10/20/23 0319 10/21/23 0316  CRP  --   --   --   --  6.1* 4.7* 3.7*  PROCALCITON 0.18  --   --   --  <0.10 <0.10 <0.10  MG 2.6* 2.4  --   --   --  2.5* 2.4  CALCIUM 8.3* 8.1* 8.4* 8.7*  --  8.6* 8.9    --------------------------------------------------------------------------------------------------------------- Lab Results  Component Value Date   CHOL 161 09/29/2023   HDL 28 (L) 09/29/2023   LDLCALC 95 09/29/2023   TRIG 219 (H) 09/30/2023   CHOLHDL 5.8 09/29/2023    Lab Results  Component Value Date   HGBA1C 10.8 (H) 09/29/2023     Micro Results Recent Results (from the past 240 hours)  Culture, Respiratory w Gram Stain     Status: None   Collection Time: 10/12/23  4:41 PM   Specimen: Tracheal Aspirate; Respiratory  Result Value Ref Range Status   Specimen Description TRACHEAL ASPIRATE  Final   Special Requests NONE  Final   Gram Stain   Final    ABUNDANT WBC PRESENT, PREDOMINANTLY PMN RARE GRAM POSITIVE COCCI IN PAIRS    Culture   Final    FEW Normal respiratory flora-no Staph aureus or Pseudomonas seen Performed at [facility], [address] [place], [place] [address]    Report Status 10/14/2023 FINAL  Final  Culture, blood (Routine X 2) w Reflex to ID Panel     Status: None   Collection Time: 10/13/23 12:24 AM   Specimen: BLOOD LEFT HAND  Result Value Ref Range Status   Specimen Description BLOOD LEFT HAND  Final   Special Requests   Final     BOTTLES DRAWN AEROBIC AND ANAEROBIC Blood Culture adequate volume   Culture   Final    NO GROWTH 5 DAYS Performed at [facility], [address], [place], [place] [address]    Report Status 10/18/2023 FINAL  Final  Culture, blood (Routine X 2) w Reflex to ID Panel     Status: None   Collection Time: 10/13/23 12:26 AM   Specimen: BLOOD LEFT ARM  Result Value Ref Range Status   Specimen Description BLOOD LEFT ARM  Final   Special Requests   Final    BOTTLES DRAWN  AEROBIC AND ANAEROBIC Blood Culture adequate volume   Culture   Final    NO GROWTH 5 DAYS Performed at [facility], [address], [place], [place] [address]    Report Status 10/18/2023 FINAL  Final    Radiology Reports  DG Swallowing Func-Speech Pathology Result Date: 10/19/2023 Table formatting from the original result was not included. Modified Barium Swallow Study Study completed by Rowe Robert, SLP Student Supervised and reviewed by Harlon Ditty MA CCC-SLP Patient Details Name: Koray Soter MRN: [id number] Date of Birth: 25-Jan-1976 Today's Date: 10/19/2023 HPI/PMH: HPI: Jeremiah Stevens is a 48 y.o. adult who identifies as male, admitted 09/26/23 with dysphagia x 3 days along with headaches and drooling. She was found to be hypoxic to 80s on room air. CTA showed multifocal PNA.  3/18 she had right facial droop, right sided weakness, dysarthria. MRI demonstrated acute right medullary infarct. Despite ongoing dysphagia (MBS showed moderately severe pharyngeal phase dysphagia and imaging showed unilateral bulging on right), pt insisted on eating. She reportedly ordered pizza overnight 3/18 and certainly aspirated, desaturated, ultimately suffered PEA arrest, 9 minutes ACLS, copious food material and emesis in oropharynx. Self extubated 3/21, reintubated and extubated 3/26, Trach on 3/31, ATC on 4/4. Clinical Impression: Clinical Impression: Pt presents with a severe oropharyngeal dysphagia (DIGEST Score: 4)  primarily characterized by chronic and gross silent aspiration of all consistencies tested due to minimal to no pharyngeal clearance of bolus trials.      Multiple hard cough and throat clears and suctioning were required for clearing bolus samples out of pharynx. Oral transit was mostly brisk with trace residue on tongue. Swallow inititation was primarily at level of pyriforms. Silent aspiration events were a result of minimal transit of bolus through the pharynx and UES. Pharyngeal stripping wave was not present and hyolaryngeal excursion was minimal. Epiglottic inversion was not present, with only minimal movement observed. Passage through UES was very minimal for majority of consistencies but not present during puree. All these components resulted in minimal to no pharyngeal clearance, leading to gross aspiration of consistencies with no immediate cough response from pt. Pt required verbal cueing to perform hard coughs and throat clears; Multiple hard cough and throat clears and suctioning were required for clearing bolus samples out of pharynx. Coughing and throat clearing appeared to not be effective for clearing deep aspiration but only for more penetrated material near laryngeal vestibule opening. Pt required lots of suctioning for puree trial due to no pharygneal clearance of bolus. Right and left head turns, chin tuck, and effortful swallow were utilized but were not effective for improving swallowing efficiency or safety.     Recommend continued NPO status due to severe risk for aspirating large volumes of any bolus consistency. Pt can have ice chips but only after oral care; pt must use a slow rate and small bites to optimize airway safety. SLP will f/u to monitor pt's tolerance of ice chips and provide exercises for rehabilitating pharyngeal swallow. Factors that may increase risk of adverse event in presence of aspiration Rubye Oaks & Clearance Coots 2021): Factors that may increase risk of adverse event in  presence of aspiration Rubye Oaks & Clearance Coots 2021): Presence of tubes (ETT, trach, NG, etc.); Frequent aspiration of large volumes; Poor general health and/or compromised immunity Recommendations/Plan: Swallowing Evaluation Recommendations Swallowing Evaluation Recommendations Recommendations: NPO; Ice chips PRN after oral care Medication Administration: Via alternative means Swallowing strategies  : Slow rate; Small bites/sips Postural changes: Position pt fully upright for  meals; Stay upright 30-60 min after meals Oral care recommendations: Oral care BID (2x/day) Caregiver Recommendations: Have oral suction available Treatment Plan Treatment Plan Treatment recommendations: Therapy as outlined in treatment plan below Follow-up recommendations: Follow physicians's recommendations for discharge plan and follow up therapies Treatment frequency: Min 2x/week Treatment duration: 2 weeks Recommendations Recommendations for follow up therapy are one component of a multi-disciplinary discharge planning process, led by the attending physician.  Recommendations may be updated based on patient status, additional functional criteria and insurance authorization. Assessment: Orofacial Exam: No data recorded Anatomy: Anatomy: WFL (Trach) Boluses Administered: Boluses Administered Boluses Administered: Thin liquids (Level 0); Mildly thick liquids (Level 2, nectar thick); Moderately thick liquids (Level 3, honey thick)  Oral Impairment Domain: Oral Impairment Domain Lip Closure: No labial escape Tongue control during bolus hold: Posterior escape of less than half of bolus Bolus preparation/mastication: -- (Not tested.) Bolus transport/lingual motion: Brisk tongue motion Oral residue: Trace residue lining oral structures Location of oral residue : Tongue Initiation of pharyngeal swallow : Pyriform sinuses  Pharyngeal Impairment Domain: Pharyngeal Impairment Domain Soft palate elevation: Trace column of contrast or air between SP and PW  Laryngeal elevation: Minimal superior movement of thyroid cartilage with minimal approximation of arytenoids to epiglottic petiole Anterior hyoid excursion: Partial anterior movement Epiglottic movement: No inversion Laryngeal vestibule closure: None, wide column air/contrast in laryngeal vestibule Pharyngeal stripping wave : Absent Pharyngeal contraction (A/P view only): N/A Pharyngoesophageal segment opening: No distension with total obstruction of flow Tongue base retraction: Narrow column of contrast or air between tongue base and PPW Pharyngeal residue: Minimal to no pharyngeal clearance Location of pharyngeal residue: Diffuse (>3 areas)  Esophageal Impairment Domain: Esophageal Impairment Domain Esophageal clearance upright position: -- (Not tested.) Pill: No data recorded Penetration/Aspiration Scale Score: Penetration/Aspiration Scale Score 8.  Material enters airway, passes BELOW cords without attempt by patient to eject out (silent aspiration) : Thin liquids (Level 0); Mildly thick liquids (Level 2, nectar thick); Moderately thick liquids (Level 3, honey thick); Puree Compensatory Strategies: Compensatory Strategies Compensatory strategies: Yes Effortful swallow: Ineffective Ineffective Effortful Swallow: Puree Chin tuck: Ineffective Ineffective Chin Tuck: Puree Left head turn: Ineffective Ineffective Left Head Turn: Moderately thick liquid (Level 3, honey thick) Right head turn: Ineffective Ineffective Right Head Turn: Mildly thick liquid (Level 2, nectar thick)   General Information: Caregiver present: No  Diet Prior to this Study: NPO; Cortrak/Small bore NG tube   Temperature : Normal   Respiratory Status: WFL   Supplemental O2: Trach Collar   History of Recent Intubation: Yes  Behavior/Cognition: Alert; Cooperative Self-Feeding Abilities: Able to self-feed Baseline vocal quality/speech: Aphonic Volitional Cough: Able to elicit Volitional Swallow: Able to elicit Exam Limitations: No limitations Goal  Planning: Prognosis for improved oropharyngeal function: Guarded Barriers to Reach Goals: Severity of deficits No data recorded No data recorded Consulted and agree with results and recommendations: Patient Pain: Pain Assessment Pain Assessment: No/denies pain End of Session: Start Time:SLP Start Time (ACUTE ONLY): 1335 Stop Time: SLP Stop Time (ACUTE ONLY): 1359 Time Calculation:SLP Time Calculation (min) (ACUTE ONLY): 24 min Charges: No data recorded SLP visit diagnosis: SLP Visit Diagnosis: Dysphagia, oropharyngeal phase (R13.12) Past Medical History: Past Medical History: Diagnosis Date  Cancer (HCC)   seizures  Diabetes (HCC)   Diabetes mellitus without complication (HCC)   Heartburn   occasional; OTC as needed  Herpes genitalis in men   HIV (human immunodeficiency virus infection) (HCC)   HIV disease (HCC)   HTN (hypertension)   Hyperlipidemia  Hypertension   under control with med., has been on med. x 1 yr.  Lateral malleolar fracture 09/02/2013  left  Migraines   Tear of deltoid ligament of left ankle 09/02/2013  Type 2 diabetes mellitus with hyperosmolar nonketotic hyperglycemia (HCC) 02/10/2020 Past Surgical History: Past Surgical History: Procedure Laterality Date  NO PAST SURGERIES    ORIF ANKLE FRACTURE Left 09/13/2013  Procedure: OPEN REDUCTION INTERNAL FIXATION (ORIF) LEFT LATERAL MALLEOLUS ANKLE FRACTURE ;  Surgeon: Dannielle Huh, MD;  Location: [facility];  Service: Orthopedics;  Laterality: Left;  TRACHEOSTOMY TUBE PLACEMENT N/A 10/11/2023  Procedure: CREATION, TRACHEOSTOMY;  Surgeon: Serena Colonel, MD;  Location: [facility] OR;  Service: ENT;  Laterality: N/A; [place], Riley Nearing 10/19/2023, 3:01 PM     Signature  -   Susa Raring M.D on 10/21/2023 at 7:53 AM   -  To page go to [email or web address]

## 2023-10-22 DIAGNOSIS — J189 Pneumonia, unspecified organism: Secondary | ICD-10-CM | POA: Diagnosis not present

## 2023-10-22 LAB — CBC WITH DIFFERENTIAL/PLATELET
Abs Immature Granulocytes: 0.03 10*3/uL (ref 0.00–0.07)
Basophils Absolute: 0 10*3/uL (ref 0.0–0.1)
Basophils Relative: 0 %
Eosinophils Absolute: 0.1 10*3/uL (ref 0.0–0.5)
Eosinophils Relative: 3 %
HCT: 29.7 % — ABNORMAL LOW (ref 39.0–52.0)
Hemoglobin: 9.4 g/dL — ABNORMAL LOW (ref 13.0–17.0)
Immature Granulocytes: 1 %
Lymphocytes Relative: 21 %
Lymphs Abs: 1 10*3/uL (ref 0.7–4.0)
MCH: 29.3 pg (ref 26.0–34.0)
MCHC: 31.6 g/dL (ref 30.0–36.0)
MCV: 92.5 fL (ref 80.0–100.0)
Monocytes Absolute: 0.6 10*3/uL (ref 0.1–1.0)
Monocytes Relative: 12 %
Neutro Abs: 3.2 10*3/uL (ref 1.7–7.7)
Neutrophils Relative %: 63 %
Platelets: 404 10*3/uL — ABNORMAL HIGH (ref 150–400)
RBC: 3.21 MIL/uL — ABNORMAL LOW (ref 4.22–5.81)
RDW: 15.4 % (ref 11.5–15.5)
WBC: 5 10*3/uL (ref 4.0–10.5)
nRBC: 0 % (ref 0.0–0.2)

## 2023-10-22 LAB — BASIC METABOLIC PANEL WITH GFR
Anion gap: 7 (ref 5–15)
BUN: 15 mg/dL (ref 6–20)
CO2: 31 mmol/L (ref 22–32)
Calcium: 9.3 mg/dL (ref 8.9–10.3)
Chloride: 101 mmol/L (ref 98–111)
Creatinine, Ser: 1.08 mg/dL (ref 0.61–1.24)
GFR, Estimated: >60 mL/min (ref >60)
Glucose, Bld: 84 mg/dL (ref 70–99)
Potassium: 4.5 mmol/L (ref 3.5–5.1)
Sodium: 139 mmol/L (ref 135–145)

## 2023-10-22 LAB — GLUCOSE, CAPILLARY
Glucose-Capillary: 126 mg/dL — ABNORMAL HIGH (ref 70–99)
Glucose-Capillary: 124 mg/dL — ABNORMAL HIGH (ref 70–99)
Glucose-Capillary: 80 mg/dL (ref 70–99)
Glucose-Capillary: 103 mg/dL — ABNORMAL HIGH (ref 70–99)
Glucose-Capillary: 160 mg/dL — ABNORMAL HIGH (ref 70–99)

## 2023-10-22 LAB — C-REACTIVE PROTEIN: CRP: 3.6 mg/dL — ABNORMAL HIGH (ref <1.0)

## 2023-10-22 LAB — PHOSPHORUS: Phosphorus: 3.2 mg/dL (ref 2.5–4.6)

## 2023-10-22 LAB — MAGNESIUM: Magnesium: 2.3 mg/dL (ref 1.7–2.4)

## 2023-10-22 LAB — PROCALCITONIN: Procalcitonin: <0.1 ng/mL

## 2023-10-22 NOTE — Progress Notes (Signed)
 PROGRESS NOTE   Jeremiah Stevens  ZOX:[id number]    DOB: [birth year]    DOA: 09/26/2023  PCP: Judyann Munson, MD   I have briefly reviewed patients previous medical records in [facility].  Chief Complaint  Patient presents with   Dysphagia    Brief Hospital Course:  48 y.o. adult who with medical history including but not limited to DM2, seizures, HIV AIDS untreated, cryptococcal disease, obesity, polysubstance abuse, prior CVA. She was admitted 09/26/23 with dysphagia x 3 days along with headaches and drooling. He was found to be hypoxic to 80s on room air. CTA was negative for PE but showed multifocal PNA. He was started on abx and BD's. Biktarvy was continued and ID was consulted. 3/18 He had right facial droop, right sided weakness, dysarthria. Neurology was consulted and recommended CTA and MRI. MRI demonstrated acute right medullary infarct. Despite ongoing dysphagia (MBS showed moderately severe pharyngeal phase dysphagia and imaging showed unilateral bulging on right), pt insisted on eating. He reportedly ordered pizza overnight 3/18 and certainly aspirated. He had threatened to leave AMA despite stroke workup ongoing. 3/19, he had hypoxia for which rapid response was called. While being assess, he continued to desaturate down into the 60s and then 40s. He required BVM and was transferred to the ICU for further evaluation and management. Upon arrival in the ICU, she had ongoing desaturations and bradycardia before developing PEA arrest. He had 9 minutes ACLS prior to ROSC including epi x 4 and bicarb x 2. During intubation attempt, she had copious food material and emesis in her oropharynx. Visualization of vocal cords was difficult due to amount of food material and emesis despite suctioning. On 2nd attempt, ETT was passed successfully and saturations improved shortly thereafter. There was grand mal seizure activity noted after intubation that abated after Midazolam  administration.  Significant hospital events:  3/16 admit 3/18 neuro consult, found to have acute CVA 3/19 Ongoing aspiration despite obvious dysphagia, was planning to leave AMA but sister talked pt into staying. then had a massive aspiration event leading to worse hypoxia and PEA arrest. Txf to [facility] 3/20 PSV cumulative total of 5-6 hrs, following some commands/ purposefully 3/21 self extubated, was reintubated 3/23 Failed SBT due to WOB 3/24 Failed SBT due to apnea 3/25 Tolerated PS >4 hours 3/26: Intubated. 3/31: S/p tracheostomy with ENT. 4/4: trach collar starting 1200  4/5 cont on trach collar  4/7: Care transferred from [facility] to [facility] 4/8: Modified barium swallow.  Failed.  NPO.   Assessment & Plan:   Acute hypoxic resp failure - S/p trach - Multifocal Aspiration PNA - Aspiration event- Hx tobacco use - Dysphagia Tolerated trach collar trial off the vent, with mildly increased FiO2 requirement 25-40% on 4/6 . S/P a one-time dose of IV Lasix trial to improve the respiratory status on 4/6.   Currently without tachypnea, saturating in the high 90s on trach collar FiO2 35%, 10 L/min.  Discussed with RN at bedside to attempt to wean. Tracheostomy management per PCCM and ENT Dr. Pollyann Kennedy.  Case discussed with Dr. Pollyann Kennedy on 10/20/2023, has deferred trach changes to RT.  He will be available as needed. Patient currently has #8 Shiley cuffed trach as of 10/20/2023, discussed with ENT again on 10/22/2023 they will see the patient today and readdress any trach change if needed.   -GPC on trach asp 10/12/23, on cefepime #7/7 -SLP following for dysphagia and PMV valve use. -added CPT  -Unfortunately does not have LTAC benefits  Dysphagia.  Still on NG tube feeds.  Speech therapy following unfortunately failed another modified barium swallow on 10/19/2023.  Will monitor for another few days if continues to fail swallow evaluations will require a PEG tube.  Patient is sitting up in the recliner for  several hours now, will try to have speech therapy reevaluate her once she is sitting up in recliner in hopes of her doing slightly better on her swallow eval.  If all fails then we will proceed to PEG tube in the next few days.   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  Recommending LTAC. -ASA, statin, seen by neurology this admission.   Acute encephalopathy Anxiety, depression  P -minimize CNS depressing meds -delirium precautions  -Encephalopathy has resolved.   PEA arrest  -?Sz like activity following, stable echocardiogram with preserved EF no wall motion abnormality. P -supportive care    HFpEF EF 65% Hx HTN HLD -statin, dilt, metop PRN hydral and PRN labetalol  Blood pressure is controlled.   Normocytic anemia -Follow intermittent CBC.  Hemoglobin stable in the 8-9 g range.   HIV / AIDS Hx +cryptococcal antigen  P -Seen by ID currently on following regimen per ID, post discharge follow-up with ID in the office.  Previously untreated.  Counseled on compliance.  -Continue p.o. fluconazole 400 mg daily on dischare QTc 385 -Continue Descovy/Tivicay  -Continue atovaquone for PJP prophylaxis  DM2, with hyperglycemia, on steroids -cont basal + SSI.  Good control on current regimen.  CBG (last 3)  Recent Labs    10/21/23 2018 10/21/23 2321 10/22/23 0311  GLUCAP 166* 171* 126*   Lab Results  Component Value Date   HGBA1C 10.8 (H) 09/29/2023    Code status -Full  Medical noncompliance. As per chart review and detailed discussion with overnight ICU RN, patient has medical decision making capacity but is very noncompliant-pulled out feeding tube, attempts to pull out tracheostomy, sickly counseled on being compliant, explained the risk of sudden aspiration, respiratory arrest, death.      Body mass index is 34.38 kg/m.  Obesity.  Follow with PCP.    Nutritional Status Nutrition Problem:  Increased nutrient needs Etiology: chronic illness Signs/Symptoms: estimated needs Interventions: Tube feeding, Prostat   DVT prophylaxis: SCDs Start: 09/27/23 0018.  Lovenox   Code Status: Full Code:  Family Communication: Father Sheralyn Boatman on 10/21/23 - [phone] Disposition:  Status is: Inpatient  Consultants:   ENT Psychiatry PCCM Neurology Infectious disease  Subjective:   Patient sitting up in the recliner appears comfortable, denies any headache, no fever, no chest pain or pressure, no shortness of breath , no abdominal pain. No focal weakness.  Says overall continues to feel better.  Objective:   Vitals:   10/22/23 0337 10/22/23 0400 10/22/23 0406 10/22/23 0500  BP: 119/75 (!) 107/95    Pulse: 79 (!) 144    Resp: (!) 30 18 20    Temp:  98.9 F (37.2 C)    TempSrc:  Oral    SpO2:  91%    Weight:    115 kg  Height:        Awake Alert, No new F.N deficits, NG tube in place, tracheostomy site stable with trach collar [place].AT,PERRAL Supple Neck, No JVD,   Symmetrical Chest wall movement, Good air movement bilaterally, CTAB RRR,No Gallops, Rubs or new Murmurs,  +ve B.Sounds, Abd Soft, No tenderness,   No Cyanosis, Clubbing or edema   Data  Reviewed:   I have personally reviewed following labs and imaging studies    Data Review:   Inpatient Medications  Scheduled Meds:  aspirin  81 mg Per Tube Daily   atorvastatin  20 mg Per Tube QHS   atovaquone  1,500 mg Per Tube Q lunch   Chlorhexidine Gluconate Cloth  6 each Topical Daily   clopidogrel  75 mg Per Tube Daily   diltiazem  60 mg Per Tube Q6H   dolutegravir  50 mg Per Tube Daily   emtricitabine-tenofovir  1 tablet Per Tube Daily   enoxaparin (LOVENOX) injection  50 mg Subcutaneous Q24H   feeding supplement (PROSource TF20)  60 mL Per Tube Daily   fiber supplement (BANATROL TF)  60 mL Per Tube BID   fluconazole  400 mg Per Tube Daily   free water  100 mL Per Tube Q4H   Gerhardt's butt cream   Topical BID    insulin aspart  0-20 Units Subcutaneous Q4H   insulin aspart  10 Units Subcutaneous 4 times per day   insulin glargine-yfgn  34 Units Subcutaneous BID   metoprolol tartrate  25 mg Per Tube BID   multivitamin with minerals  1 tablet Per Tube Daily   nicotine  14 mg Transdermal Daily   nystatin  5 mL Oral QID   mouth rinse  15 mL Mouth Rinse Q2H   pantoprazole (PROTONIX) IV  40 mg Intravenous Q12H   PARoxetine  20 mg Per Tube Daily   pneumococcal 20-valent conjugate vaccine  0.5 mL Intramuscular Tomorrow-1000   QUEtiapine  50 mg Per Tube BID   Continuous Infusions:  ceFEPime (MAXIPIME) IV 2 g (10/22/23 0249)   dextrose     feeding supplement (OSMOLITE 1.5 CAL) 1,000 mL (10/22/23 0257)   PRN Meds:.acetaminophen **OR** acetaminophen, alum & mag hydroxide-simeth, bisacodyl, dextrose, fentaNYL (SUBLIMAZE) injection, hydrALAZINE, hydrOXYzine, labetalol, levalbuterol, loperamide HCl, melatonin, ondansetron **OR** ondansetron (ZOFRAN) IV, mouth rinse, oxyCODONE, polyethylene glycol  Recent Labs  Lab 10/18/23 0223 10/19/23 0321 10/20/23 0319 10/21/23 0316 10/22/23 0633  WBC 5.2 4.5 3.9* 3.7* 5.0  HGB 9.1* 9.0* 8.3* 8.8* 9.4*  HCT 28.2* 28.3* 26.3* 27.8* 29.7*  PLT 299 363 361 392 404*  MCV 93.1 92.5 92.6 92.7 92.5  MCH 30.0 29.4 29.2 29.3 29.3  MCHC 32.3 31.8 31.6 31.7 31.6  RDW 15.0 14.9 15.1 15.2 15.4  LYMPHSABS  --   --  0.6* 0.5* 1.0  MONOABS  --   --  0.5 0.4 0.6  EOSABS  --   --  0.1 0.1 0.1  BASOSABS  --   --  0.0 0.0 0.0    Recent Labs  Lab 10/16/23 0212 10/16/23 1054 10/18/23 0223 10/19/23 0321 10/19/23 0441 10/20/23 0319 10/21/23 0316 10/22/23 0633  NA 139  --  136 135  --  136 139 139  K 4.3  --  4.4 5.2*  --  4.4 4.4 4.5  CL 103  --  101 98  --  98 99 101  CO2 31  --  28 28  --  31 30 31   ANIONGAP 5  --  7 9  --  7 10 7   GLUCOSE 175*  --  145* 90  --  135* 91 84  BUN 22*  --  21* 20  --  17 13 15   CREATININE 0.98  --  1.08 1.07  --  0.97 0.92 1.08  AST  --   19  --   --   --   --   --   --  ALT  --  29  --   --   --   --   --   --   ALKPHOS  --  78  --   --   --   --   --   --   BILITOT  --  0.4  --   --   --   --   --   --   ALBUMIN  --  2.2*  --   --   --   --   --   --   CRP  --   --   --   --  6.1* 4.7* 3.7* 3.6*  PROCALCITON  --   --   --   --  <0.10 <0.10 <0.10  --   MG 2.4  --   --   --   --  2.5* 2.4 2.3  PHOS  --   --   --   --   --  3.5 2.8 3.2  CALCIUM 8.1*  --  8.4* 8.7*  --  8.6* 8.9 9.3      Recent Labs  Lab 10/16/23 0212 10/18/23 0223 10/19/23 0321 10/19/23 0441 10/20/23 0319 10/21/23 0316 10/22/23 0633  CRP  --   --   --  6.1* 4.7* 3.7* 3.6*  PROCALCITON  --   --   --  <0.10 <0.10 <0.10  --   MG 2.4  --   --   --  2.5* 2.4 2.3  CALCIUM 8.1* 8.4* 8.7*  --  8.6* 8.9 9.3    --------------------------------------------------------------------------------------------------------------- Lab Results  Component Value Date   CHOL 161 09/29/2023   HDL 28 (L) 09/29/2023   LDLCALC 95 09/29/2023   TRIG 219 (H) 09/30/2023   CHOLHDL 5.8 09/29/2023    Lab Results  Component Value Date   HGBA1C 10.8 (H) 09/29/2023     Micro Results Recent Results (from the past 240 hours)  Culture, Respiratory w Gram Stain     Status: None   Collection Time: 10/12/23  4:41 PM   Specimen: Tracheal Aspirate; Respiratory  Result Value Ref Range Status   Specimen Description TRACHEAL ASPIRATE  Final   Special Requests NONE  Final   Gram Stain   Final    ABUNDANT WBC PRESENT, PREDOMINANTLY PMN RARE GRAM POSITIVE COCCI IN PAIRS    Culture   Final    FEW Normal respiratory flora-no Staph aureus or Pseudomonas seen Performed at [facility], [address], [place], [place] [address]    Report Status 10/14/2023 FINAL  Final  Culture, blood (Routine X 2) w Reflex to ID Panel     Status: None   Collection Time: 10/13/23 12:24 AM   Specimen: BLOOD LEFT HAND  Result Value Ref Range Status   Specimen Description BLOOD LEFT HAND   Final   Special Requests   Final    BOTTLES DRAWN AEROBIC AND ANAEROBIC Blood Culture adequate volume   Culture   Final    NO GROWTH 5 DAYS Performed at [facility], [address], [place], [place] [address]    Report Status 10/18/2023 FINAL  Final  Culture, blood (Routine X 2) w Reflex to ID Panel     Status: None   Collection Time: 10/13/23 12:26 AM   Specimen: BLOOD LEFT ARM  Result Value Ref Range Status   Specimen Description BLOOD LEFT ARM  Final   Special Requests   Final    BOTTLES DRAWN AEROBIC AND ANAEROBIC  Blood Culture adequate volume   Culture   Final    NO GROWTH 5 DAYS Performed at [facility], [address] [place], [place] [address]    Report Status 10/18/2023 FINAL  Final    Radiology Reports  No results found.     Signature  -   Susa Raring M.D on 10/22/2023 at 8:10 AM   -  To page go to [email or web address]

## 2023-10-22 NOTE — Plan of Care (Signed)
  Problem: Health Behavior/Discharge Planning: Goal: Ability to identify and utilize available resources and services will improve Outcome: Progressing Goal: Ability to manage health-related needs will improve Outcome: Progressing   Problem: Metabolic: Goal: Ability to maintain appropriate glucose levels will improve Outcome: Progressing   Problem: Nutritional: Goal: Maintenance of adequate nutrition will improve Outcome: Progressing Goal: Progress toward achieving an optimal weight will improve Outcome: Progressing   Problem: Skin Integrity: Goal: Risk for impaired skin integrity will decrease Outcome: Progressing   Problem: Tissue Perfusion: Goal: Adequacy of tissue perfusion will improve Outcome: Progressing   Problem: Education: Goal: Knowledge of General Education information will improve Description: Including pain rating scale, medication(s)/side effects and non-pharmacologic comfort measures Outcome: Progressing   Problem: Health Behavior/Discharge Planning: Goal: Ability to manage health-related needs will improve Outcome: Progressing   Problem: Clinical Measurements: Goal: Ability to maintain clinical measurements within normal limits will improve Outcome: Progressing Goal: Will remain free from infection Outcome: Progressing Goal: Diagnostic test results will improve Outcome: Progressing Goal: Respiratory complications will improve Outcome: Progressing Goal: Cardiovascular complication will be avoided Outcome: Progressing   Problem: Activity: Goal: Risk for activity intolerance will decrease Outcome: Progressing   Problem: Nutrition: Goal: Adequate nutrition will be maintained Outcome: Progressing   Problem: Coping: Goal: Level of anxiety will decrease Outcome: Progressing   Problem: Elimination: Goal: Will not experience complications related to bowel motility Outcome: Progressing Goal: Will not experience complications related to urinary  retention Outcome: Progressing   Problem: Pain Managment: Goal: General experience of comfort will improve and/or be controlled Outcome: Progressing   Problem: Safety: Goal: Ability to remain free from injury will improve Outcome: Progressing   Problem: Skin Integrity: Goal: Risk for impaired skin integrity will decrease Outcome: Progressing   Problem: Activity: Goal: Ability to tolerate increased activity will improve Outcome: Progressing   Problem: Clinical Measurements: Goal: Ability to maintain a body temperature in the normal range will improve Outcome: Progressing   Problem: Respiratory: Goal: Ability to maintain adequate ventilation will improve Outcome: Progressing Goal: Ability to maintain a clear airway will improve Outcome: Progressing   Problem: Education: Goal: Knowledge of disease or condition will improve Outcome: Progressing Goal: Knowledge of secondary prevention will improve (MUST DOCUMENT ALL) Outcome: Progressing Goal: Knowledge of patient specific risk factors will improve (DELETE if not current risk factor) Outcome: Progressing   Problem: Health Behavior/Discharge Planning: Goal: Ability to manage health-related needs will improve Outcome: Progressing Goal: Goals will be collaboratively established with patient/family Outcome: Progressing   Problem: Coping: Goal: Will verbalize positive feelings about self Outcome: Progressing Goal: Will identify appropriate support needs Outcome: Progressing   Problem: Self-Care: Goal: Ability to participate in self-care as condition permits will improve Outcome: Progressing Goal: Verbalization of feelings and concerns over difficulty with self-care will improve Outcome: Progressing Goal: Ability to communicate needs accurately will improve Outcome: Progressing

## 2023-10-22 NOTE — Progress Notes (Signed)
 SLP Cancellation Note  Patient Details Name: Jeremiah Stevens MRN: [id number] DOB: 04/22/1976   Cancelled treatment:       Reason Eval/Treat Not Completed: Other (comment). Attempted x2. Pt working with staff this am and in bed sleeping this pm. Will f/u   Jeremiah Stevens, Riley Nearing 10/22/2023, 2:04 PM

## 2023-10-22 NOTE — Plan of Care (Signed)
  Problem: Health Behavior/Discharge Planning: Goal: Ability to identify and utilize available resources and services will improve Outcome: Progressing Goal: Ability to manage health-related needs will improve Outcome: Progressing   Problem: Metabolic: Goal: Ability to maintain appropriate glucose levels will improve Outcome: Progressing   Problem: Nutritional: Goal: Maintenance of adequate nutrition will improve Outcome: Progressing Goal: Progress toward achieving an optimal weight will improve Outcome: Progressing   Problem: Skin Integrity: Goal: Risk for impaired skin integrity will decrease Outcome: Progressing   Problem: Tissue Perfusion: Goal: Adequacy of tissue perfusion will improve Outcome: Progressing

## 2023-10-22 NOTE — Progress Notes (Signed)
 Occupational Therapy Treatment Patient Details Name: Jeremiah Stevens MRN: [id number] DOB: [birth year] Today's Date: 10/22/2023   History of present illness 48 yo pt admitted to [facility] 09/26/23 for difficulty swallowing. 3/19 PEA, ROSC after 2 min, then bradycardic arrest 1 min later, CPR resumed, pt intubated, and ROSC 6 min later. Seizure-like activity also noted briefly after ROSC. 3/20 pt transferred to [facility]. 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 3/21 self extubated and reintubated. 3/31 trach. PMHx: HIV/AIDS, HTN, herpes, migraine, depression, DM II, polysubstance use, and seizure.   OT comments  Pt progressing toward goals, overall needing CGA for bed mobility and pivot transfer to chair with RW. Pt able to reach down to pull up socks and noted to have 1/4 DOE after transfer/LB ADL. SpO2 remained in 90s on 6L O2 28% FiO2 trach collar. Pt presenting with impairments listed below, will follow acutely. Patient will benefit from continued inpatient follow up therapy, <3 hours/day to maximize safety/ind with ADL/functional mobility.       If plan is discharge home, recommend the following:  A lot of help with walking and/or transfers;A lot of help with bathing/dressing/bathroom;Assistance with cooking/housework;Direct supervision/assist for medications management;Direct supervision/assist for financial management;Assist for transportation;Help with stairs or ramp for entrance   Equipment Recommendations  Wheelchair (measurements OT);Wheelchair cushion (measurements OT);BSC/3in1;Other (comment) (RW)    Recommendations for Other Services Speech consult    Precautions / Restrictions Precautions Precautions: Fall;Other (comment) Recall of Precautions/Restrictions: Impaired Precaution/Restrictions Comments: trach, cortrak, PMV Restrictions Weight Bearing Restrictions Per Provider Order: No       Mobility Bed Mobility Overal bed mobility: Needs  Assistance Bed Mobility: Sidelying to Sit   Sidelying to sit: Contact guard assist            Transfers Overall transfer level: Needs assistance Equipment used: Rolling walker (2 wheels) Transfers: Sit to/from Stand, Bed to chair/wheelchair/BSC Sit to Stand: Contact guard assist                 Balance Overall balance assessment: Needs assistance Sitting-balance support: No upper extremity supported, Feet supported Sitting balance-Leahy Scale: Fair Sitting balance - Comments: supervision EOB                                   ADL either performed or assessed with clinical judgement   ADL Overall ADL's : Needs assistance/impaired Eating/Feeding: NPO                   Lower Body Dressing: Minimal assistance Lower Body Dressing Details (indicate cue type and reason): reaches down to pull up socks             Functional mobility during ADLs: Contact guard assist;Rolling walker (2 wheels)      Extremity/Trunk Assessment Upper Extremity Assessment RUE Deficits / Details: 3/5 shoulder ROM, able to bring hand to mouth with elbow and wrist supported RUE Coordination: decreased gross motor;decreased fine motor   Lower Extremity Assessment Lower Extremity Assessment: Defer to PT evaluation        Vision   Vision Assessment?: No apparent visual deficits   Perception Perception Perception: Within Functional Limits   Praxis Praxis Praxis: [facility]   Communication Communication Communication: Impaired Factors Affecting Communication: Trach/intubated;[place] - Muir valve   Cognition Arousal: Alert Behavior During Therapy: WFL for tasks assessed/performed               OT -  Cognition Comments: speaking appropriately with PSMV, encouraged to spit secretions into suction                 Following commands: Intact Following commands impaired: Follows multi-step commands with increased time      Cueing   Cueing Techniques: Verbal  cues, Tactile cues  Exercises      Shoulder Instructions       General Comments SpO2 98% on 6L O2 28% FiO2 trach collar    Pertinent Vitals/ Pain       Pain Assessment Pain Assessment: Faces Pain Score: 4  Faces Pain Scale: Hurts little more Pain Location: headache Pain Descriptors / Indicators: Headache Pain Intervention(s): Limited activity within patient's tolerance, Monitored during session, Repositioned  Home Living                                          Prior Functioning/Environment              Frequency  Min 2X/week        Progress Toward Goals  OT Goals(current goals can now be found in the care plan section)  Progress towards OT goals: Progressing toward goals  Acute Rehab OT Goals Patient Stated Goal: none stated OT Goal Formulation: With patient Time For Goal Achievement: 11/03/23 Potential to Achieve Goals: Good ADL Goals Pt Will Perform Upper Body Dressing: sitting;with min assist Pt Will Perform Lower Body Dressing: sit to/from stand;sitting/lateral leans;with min assist Pt Will Transfer to Toilet: with +2 assist;with mod assist;stand pivot transfer;bedside commode Additional ADL Goal #1: pt will complete HEP R UE min (A) written  Plan      Co-evaluation                 AM-PAC OT "6 Clicks" Daily Activity     Outcome Measure   Help from another person eating meals?: A Lot Help from another person taking care of personal grooming?: A Little Help from another person toileting, which includes using toliet, bedpan, or urinal?: A Little Help from another person bathing (including washing, rinsing, drying)?: A Lot Help from another person to put on and taking off regular upper body clothing?: A Little Help from another person to put on and taking off regular lower body clothing?: A Lot 6 Click Score: 15    End of Session Equipment Utilized During Treatment: Oxygen (6L 28% FiO2)  OT Visit Diagnosis: Unsteadiness  on feet (R26.81);Muscle weakness (generalized) (M62.81) Symptoms and signs involving cognitive functions: Cerebral infarction   Activity Tolerance Patient tolerated treatment well   Patient Left in chair;with call bell/phone within reach;with chair alarm set   Nurse Communication Mobility status (tube feed resumed)        Time: 1610-9604 OT Time Calculation (min): 16 min  Charges: OT General Charges $OT Visit: 1 Visit OT Treatments $Therapeutic Activity: 8-22 mins  Carver Fila, OTD, OTR/L SecureChat Preferred Acute Rehab [phone]   Carver Fila Koonce 10/22/2023, 5:16 PM

## 2023-10-22 NOTE — Progress Notes (Signed)
 Physical Therapy Treatment Patient Details Name: Jeremiah Stevens MRN: [id number] DOB: 1975-09-02 Today's Date: 10/22/2023   History of Present Illness 48 yo pt admitted to [facility] 09/26/23 for difficulty swallowing. 3/19 PEA, ROSC after 2 min, then bradycardic arrest 1 min later, CPR resumed, pt intubated, and ROSC 6 min later. Seizure-like activity also noted briefly after ROSC. 3/20 pt transferred to [facility]. 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 3/21 self extubated and reintubated. 3/31 trach. PMHx: HIV/AIDS, HTN, herpes, migraine, depression, DM II, polysubstance use, and seizure.    PT Comments  Limited treatment, agreeable to work on EOB activities only due to headache. Bed mobility training, has progressed to CGA level for supine to sit and sit to supine transitions. Able to perform lateral scoots and squat pivot along bed with CGA. Assisted to reposition due to sinking slow in bed when PT entered room. Patient will continue to benefit from skilled physical therapy services to further improve independence with functional mobility. Encouraged OOB with staff this evening and multiple times per day as tolerated during admission.     If plan is discharge home, recommend the following: Assistance with cooking/housework;Assist for transportation;Help with stairs or ramp for entrance;A little help with walking and/or transfers;A little help with bathing/dressing/bathroom   Can travel by private vehicle     Yes  Equipment Recommendations  Wheelchair cushion (measurements PT);Wheelchair (measurements PT);Hospital bed    Recommendations for Other Services       Precautions / Restrictions Precautions Precautions: Fall;Other (comment) Recall of Precautions/Restrictions: Impaired Precaution/Restrictions Comments: trach, cortrak, primofit, PMV Restrictions Weight Bearing Restrictions Per Provider Order: No     Mobility  Bed Mobility Overal bed mobility:  Needs Assistance Bed Mobility: Supine to Sit, Sit to Supine     Supine to sit: Contact guard, HOB elevated Sit to supine: Contact guard assist   General bed mobility comments: CGA to rise to EOB and return to supine. Educated on techniques prior to attempt. No physical assist needed. HOB elevated to rise.    Transfers Overall transfer level: Needs assistance Equipment used: None       Step pivot transfers: Contact guard assist       General transfer comment: Lateral scoot and squat scoot along bed with CGA, cues for technique with each.    Ambulation/Gait               General Gait Details: declined   Optometrist     Tilt Bed    Modified Rankin (Stroke Patients Only) Modified Rankin (Stroke Patients Only) Pre-Morbid Rankin Score: No symptoms Modified Rankin: Severe disability     Balance Overall balance assessment: Needs assistance Sitting-balance support: No upper extremity supported, Feet supported Sitting balance-Leahy Scale: Fair Sitting balance - Comments: supervision EOB                                    Communication Communication Communication: Impaired Factors Affecting Communication: Trach/intubated;[place] - Muir valve  Cognition Arousal: Alert Behavior During Therapy: WFL for tasks assessed/performed   PT - Cognitive impairments: No apparent impairments Difficult to assess due to: Tracheostomy                     PT - Cognition Comments: reduced clarity of speech on PMV Following commands: Intact Following commands impaired: Follows  multi-step commands with increased time    Cueing Cueing Techniques: Verbal cues, Tactile cues  Exercises      General Comments General comments (skin integrity, edema, etc.): SpO2 100% on RA (collar was off to side when PT entered room - reapplied)      Pertinent Vitals/Pain Pain Assessment Pain Assessment: Faces Faces Pain Scale: Hurts  little more Pain Location: headache Pain Descriptors / Indicators: Aching Pain Intervention(s): Monitored during session, Limited activity within patient's tolerance, Repositioned    Home Living                          Prior Function            PT Goals (current goals can now be found in the care plan section) Acute Rehab PT Goals Patient Stated Goal: to return home PT Goal Formulation: With patient Time For Goal Achievement: 11/03/23 Potential to Achieve Goals: Fair Progress towards PT goals: Progressing toward goals    Frequency    Min 2X/week      PT Plan      Co-evaluation              AM-PAC PT "6 Clicks" Mobility   Outcome Measure  Help needed turning from your back to your side while in a flat bed without using bedrails?: A Little Help needed moving from lying on your back to sitting on the side of a flat bed without using bedrails?: A Little Help needed moving to and from a bed to a chair (including a wheelchair)?: A Little Help needed standing up from a chair using your arms (e.g., wheelchair or bedside chair)?: A Little Help needed to walk in hospital room?: A Lot Help needed climbing 3-5 steps with a railing? : Total 6 Click Score: 15    End of Session Equipment Utilized During Treatment: Gait belt Activity Tolerance: Other (comment) (Limited by headache) Patient left: in chair;with call bell/phone within reach;with bed alarm set (HOB elevated)   PT Visit Diagnosis: Unsteadiness on feet (R26.81);Muscle weakness (generalized) (M62.81);Other abnormalities of gait and mobility (R26.89);Difficulty in walking, not elsewhere classified (R26.2);Other symptoms and signs involving the nervous system (R29.898) Hemiplegia - Right/Left: Right Hemiplegia - dominant/non-dominant: Non-dominant Hemiplegia - caused by: Cerebral infarction     Time: 1610-9604 PT Time Calculation (min) (ACUTE ONLY): 13 min  Charges:    $Therapeutic Activity: 8-22  mins PT General Charges $$ ACUTE PT VISIT: 1 Visit                     Kathlyn Sacramento, PT, DPT [facility]  Rehabilitation Services Physical Therapist Office: [phone] Website: [email or web address]    Berton Mount 10/22/2023, 3:31 PM

## 2023-10-23 ENCOUNTER — Inpatient Hospital Stay (HOSPITAL_COMMUNITY)

## 2023-10-23 DIAGNOSIS — J189 Pneumonia, unspecified organism: Secondary | ICD-10-CM | POA: Diagnosis not present

## 2023-10-23 LAB — BASIC METABOLIC PANEL WITH GFR
Anion gap: 11 (ref 5–15)
BUN: 15 mg/dL (ref 6–20)
CO2: 32 mmol/L (ref 22–32)
Calcium: 9 mg/dL (ref 8.9–10.3)
Chloride: 96 mmol/L — ABNORMAL LOW (ref 98–111)
Creatinine, Ser: 1.01 mg/dL (ref 0.61–1.24)
GFR, Estimated: >60 mL/min (ref >60)
Glucose, Bld: 108 mg/dL — ABNORMAL HIGH (ref 70–99)
Potassium: 4.6 mmol/L (ref 3.5–5.1)
Sodium: 139 mmol/L (ref 135–145)

## 2023-10-23 LAB — GLUCOSE, CAPILLARY
Glucose-Capillary: 108 mg/dL — ABNORMAL HIGH (ref 70–99)
Glucose-Capillary: 121 mg/dL — ABNORMAL HIGH (ref 70–99)
Glucose-Capillary: 152 mg/dL — ABNORMAL HIGH (ref 70–99)
Glucose-Capillary: 180 mg/dL — ABNORMAL HIGH (ref 70–99)
Glucose-Capillary: 80 mg/dL (ref 70–99)
Glucose-Capillary: 82 mg/dL (ref 70–99)
Glucose-Capillary: 86 mg/dL (ref 70–99)

## 2023-10-23 LAB — CBC WITH DIFFERENTIAL/PLATELET
Abs Immature Granulocytes: 0.02 10*3/uL (ref 0.00–0.07)
Basophils Absolute: 0 10*3/uL (ref 0.0–0.1)
Basophils Relative: 0 %
Eosinophils Absolute: 0.1 10*3/uL (ref 0.0–0.5)
Eosinophils Relative: 2 %
HCT: 30 % — ABNORMAL LOW (ref 39.0–52.0)
Hemoglobin: 9.5 g/dL — ABNORMAL LOW (ref 13.0–17.0)
Immature Granulocytes: 1 %
Lymphocytes Relative: 17 %
Lymphs Abs: 0.7 10*3/uL (ref 0.7–4.0)
MCH: 29.3 pg (ref 26.0–34.0)
MCHC: 31.7 g/dL (ref 30.0–36.0)
MCV: 92.6 fL (ref 80.0–100.0)
Monocytes Absolute: 0.5 10*3/uL (ref 0.1–1.0)
Monocytes Relative: 11 %
Neutro Abs: 2.9 10*3/uL (ref 1.7–7.7)
Neutrophils Relative %: 69 %
Platelets: 412 10*3/uL — ABNORMAL HIGH (ref 150–400)
RBC: 3.24 MIL/uL — ABNORMAL LOW (ref 4.22–5.81)
RDW: 15.4 % (ref 11.5–15.5)
WBC: 4.2 10*3/uL (ref 4.0–10.5)
nRBC: 0 % (ref 0.0–0.2)

## 2023-10-23 LAB — TROPONIN I (HIGH SENSITIVITY)
Troponin I (High Sensitivity): 13 ng/L (ref <18)
Troponin I (High Sensitivity): 13 ng/L (ref <18)

## 2023-10-23 LAB — PROCALCITONIN: Procalcitonin: <0.1 ng/mL

## 2023-10-23 LAB — MAGNESIUM: Magnesium: 2.4 mg/dL (ref 1.7–2.4)

## 2023-10-23 LAB — C-REACTIVE PROTEIN: CRP: 2.3 mg/dL — ABNORMAL HIGH (ref <1.0)

## 2023-10-23 LAB — PHOSPHORUS: Phosphorus: 3.1 mg/dL (ref 2.5–4.6)

## 2023-10-23 MED ORDER — CLOPIDOGREL BISULFATE 75 MG PO TABS: 75.0000 mg | ORAL_TABLET | Freq: Every day | ORAL | Status: DC

## 2023-10-23 NOTE — Progress Notes (Signed)
 PROGRESS NOTE   Jeremiah Stevens  ZOX:[id number]    DOB: 10-Jul-1976    DOA: 09/26/2023  PCP: [name], MD   I have briefly reviewed patients previous medical records in [name].  Chief Complaint  Patient presents with   Dysphagia    Brief Hospital Course:  48 y.o. adult who with medical history including but not limited to DM2, seizures, HIV AIDS untreated, cryptococcal disease, obesity, polysubstance abuse, prior CVA. She was admitted 09/26/23 with dysphagia x 3 days along with headaches and drooling. He was found to be hypoxic to 80s on room air. CTA was negative for PE but showed multifocal PNA. He was started on abx and BD's. Biktarvy was continued and ID was consulted. 3/18 He had right facial droop, right sided weakness, dysarthria. Neurology was consulted and recommended CTA and MRI. MRI demonstrated acute right medullary infarct. Despite ongoing dysphagia (MBS showed moderately severe pharyngeal phase dysphagia and imaging showed unilateral bulging on right), pt insisted on eating. He reportedly ordered pizza overnight 3/18 and certainly aspirated. He had threatened to leave AMA despite stroke workup ongoing. 3/19, he had hypoxia for which rapid response was called. While being assess, he continued to desaturate down into the 60s and then 40s. He required BVM and was transferred to the ICU for further evaluation and management. Upon arrival in the ICU, she had ongoing desaturations and bradycardia before developing PEA arrest. He had 9 minutes ACLS prior to ROSC including epi x 4 and bicarb x 2. During intubation attempt, she had copious food material and emesis in her oropharynx. Visualization of vocal cords was difficult due to amount of food material and emesis despite suctioning. On 2nd attempt, ETT was passed successfully and saturations improved shortly thereafter. There was grand mal seizure activity noted after intubation that abated after Midazolam  administration.  Significant hospital events:  3/16 admit 3/18 neuro consult, found to have acute CVA 3/19 Ongoing aspiration despite obvious dysphagia, was planning to leave AMA but sister talked pt into staying. then had a massive aspiration event leading to worse hypoxia and PEA arrest. Txf to [facility] 3/20 PSV cumulative total of 5-6 hrs, following some commands/ purposefully 3/21 self extubated, was reintubated 3/23 Failed SBT due to WOB 3/24 Failed SBT due to apnea 3/25 Tolerated PS >4 hours 3/26: Intubated. 3/31: S/p tracheostomy with ENT. 4/4: trach collar starting 1200  4/5 cont on trach collar  4/7: Care transferred [facility] to [facility] 4/8: Modified barium swallow.  Failed.  NPO.   Assessment & Plan:   Acute hypoxic resp failure - S/p trach - Multifocal Aspiration PNA - Aspiration event- Hx tobacco use - Dysphagia Tolerated trach collar trial off the vent, with mildly increased FiO2 requirement 25-40% on 4/6 . S/P a one-time dose of IV Lasix trial to improve the respiratory status on 4/6.   Currently without tachypnea, saturating in the high 90s on trach collar FiO2 35%, 10 L/min.  Discussed with RN at bedside to attempt to wean. Tracheostomy management per PCCM and ENT Dr. Donalee Fruits.  Case discussed with Dr. Donalee Fruits on 10/20/2023, has deferred trach changes to RT.  He will be available as needed. Patient currently has #8 Shiley cuffed trach as of 10/20/2023, discussed with ENT again on 10/22/2023 they will see the patient today and readdress any trach change if needed.   -GPC on trach asp 10/12/23, on cefepime #7/7 -SLP following for dysphagia and PMV valve use. -added CPT  -Unfortunately does not have LTAC benefits  Dysphagia.  Still on NG tube feeds.  Speech therapy following unfortunately failed another modified barium swallow on 10/19/2023.  Will monitor for another few days if continues to fail swallow evaluations will require a PEG tube.  Patient is sitting up in the recliner for  several hours now, will try to have speech therapy reevaluate her once she is sitting up in recliner in hopes of her doing slightly better on her swallow eval. Patient now wants a PEG tube, IR consulted on 10/23/2023.   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  Recommending LTAC. -ASA, statin, seen by neurology this admission.   Acute encephalopathy Anxiety, depression  P -minimize CNS depressing meds -delirium precautions  -Encephalopathy has resolved.   PEA arrest  -?Sz like activity following, stable echocardiogram with preserved EF no wall motion abnormality. P -supportive care    HFpEF EF 65% Hx HTN HLD -statin, dilt, metop PRN hydral and PRN labetalol  Blood pressure is controlled.   Normocytic anemia -Follow intermittent CBC.  Hemoglobin stable in the 8-9 g range.   HIV / AIDS Hx +cryptococcal antigen  P -Seen by ID currently on following regimen per ID, post discharge follow-up with ID in the office.  Previously untreated.  Counseled on compliance.  -Continue p.o. fluconazole 400 mg daily on dischare QTc 385 -Continue Descovy/Tivicay  -Continue atovaquone for PJP prophylaxis  DM2, with hyperglycemia, on steroids -cont basal + SSI.  Good control on current regimen.  CBG (last 3)  Recent Labs    10/22/23 2045 10/22/23 2356 10/23/23 0358  GLUCAP 160* 152* 108*   Lab Results  Component Value Date   HGBA1C 10.8 (H) 09/29/2023    Code status -Full  Medical noncompliance. As per chart review and detailed discussion with overnight ICU RN, patient has medical decision making capacity but is very noncompliant-pulled out feeding tube, attempts to pull out tracheostomy, sickly counseled on being compliant, explained the risk of sudden aspiration, respiratory arrest, death.      Body mass index is 33.52 kg/m.  Obesity.  Follow with PCP.    Nutritional Status Nutrition Problem: Increased  nutrient needs Etiology: chronic illness Signs/Symptoms: estimated needs Interventions: Tube feeding, Prostat   DVT prophylaxis: SCDs Start: 09/27/23 0018.  Lovenox   Code Status: Full Code:  Family Communication: Father Archie Bearded on 10/21/23 - [phone] Disposition:  Status is: Inpatient  Consultants:   ENT Psychiatry PCCM Neurology Infectious disease  Subjective:   Patient in bed, appears comfortable, denies any headache, no fever, no chest pain or pressure, no shortness of breath , no abdominal pain. No new focal weakness.  Wants to get PEG tube and NG-tube removed.   Objective:   Vitals:   10/23/23 0418 10/23/23 0419 10/23/23 0456 10/23/23 0458  BP:   (!) 149/82 (!) 149/82  Pulse: 80  77   Resp: 10  (!) 23   Temp:   98.4 F (36.9 C)   TempSrc:   Oral   SpO2:   99%   Weight:  112.1 kg    Height:        Awake Alert, No new F.N deficits, NG tube in place, tracheostomy site stable with trach collar [place].AT,PERRAL Supple Neck, No JVD,   Symmetrical Chest wall movement, Good air movement bilaterally, CTAB RRR,No Gallops, Rubs or new Murmurs,  +ve B.Sounds, Abd Soft, No tenderness,   No Cyanosis, Clubbing or edema   Data Reviewed:   I have  personally reviewed following labs and imaging studies    Data Review:   Inpatient Medications  Scheduled Meds:  aspirin  81 mg Per Tube Daily   atorvastatin  20 mg Per Tube QHS   atovaquone  1,500 mg Per Tube Q lunch   Chlorhexidine Gluconate Cloth  6 each Topical Daily   clopidogrel  75 mg Per Tube Daily   diltiazem  60 mg Per Tube Q6H   dolutegravir  50 mg Per Tube Daily   emtricitabine-tenofovir  1 tablet Per Tube Daily   enoxaparin (LOVENOX) injection  50 mg Subcutaneous Q24H   feeding supplement (PROSource TF20)  60 mL Per Tube Daily   fiber supplement (BANATROL TF)  60 mL Per Tube BID   fluconazole  400 mg Per Tube Daily   free water  100 mL Per Tube Q4H   Gerhardt's butt cream   Topical BID   insulin aspart   0-20 Units Subcutaneous Q4H   insulin aspart  10 Units Subcutaneous 4 times per day   insulin glargine-yfgn  34 Units Subcutaneous BID   metoprolol tartrate  25 mg Per Tube BID   multivitamin with minerals  1 tablet Per Tube Daily   nicotine  14 mg Transdermal Daily   nystatin  5 mL Oral QID   mouth rinse  15 mL Mouth Rinse Q2H   pantoprazole (PROTONIX) IV  40 mg Intravenous Q12H   PARoxetine  20 mg Per Tube Daily   QUEtiapine  50 mg Per Tube BID   Continuous Infusions:  dextrose 20 mL/hr at 10/23/23 0602   feeding supplement (OSMOLITE 1.5 CAL) Stopped (10/23/23 0600)   PRN Meds:.acetaminophen **OR** acetaminophen, alum & mag hydroxide-simeth, bisacodyl, dextrose, fentaNYL (SUBLIMAZE) injection, hydrALAZINE, hydrOXYzine, labetalol, levalbuterol, loperamide HCl, melatonin, ondansetron **OR** ondansetron (ZOFRAN) IV, mouth rinse, oxyCODONE, polyethylene glycol  Recent Labs  Lab 10/19/23 0321 10/20/23 0319 10/21/23 0316 10/22/23 0633 10/23/23 0645  WBC 4.5 3.9* 3.7* 5.0 4.2  HGB 9.0* 8.3* 8.8* 9.4* 9.5*  HCT 28.3* 26.3* 27.8* 29.7* 30.0*  PLT 363 361 392 404* 412*  MCV 92.5 92.6 92.7 92.5 92.6  MCH 29.4 29.2 29.3 29.3 29.3  MCHC 31.8 31.6 31.7 31.6 31.7  RDW 14.9 15.1 15.2 15.4 15.4  LYMPHSABS  --  0.6* 0.5* 1.0 0.7  MONOABS  --  0.5 0.4 0.6 0.5  EOSABS  --  0.1 0.1 0.1 0.1  BASOSABS  --  0.0 0.0 0.0 0.0    Recent Labs  Lab 10/16/23 1054 10/18/23 0223 10/19/23 0321 10/19/23 0441 10/20/23 0319 10/21/23 0316 10/22/23 0633  NA  --  136 135  --  136 139 139  K  --  4.4 5.2*  --  4.4 4.4 4.5  CL  --  101 98  --  98 99 101  CO2  --  28 28  --  31 30 31   ANIONGAP  --  7 9  --  7 10 7   GLUCOSE  --  145* 90  --  135* 91 84  BUN  --  21* 20  --  17 13 15   CREATININE  --  1.08 1.07  --  0.97 0.92 1.08  AST 19  --   --   --   --   --   --   ALT 29  --   --   --   --   --   --   ALKPHOS 78  --   --   --   --   --   --  BILITOT 0.4  --   --   --   --   --   --   ALBUMIN  2.2*  --   --   --   --   --   --   CRP  --   --   --  6.1* 4.7* 3.7* 3.6*  PROCALCITON  --   --   --  <0.10 <0.10 <0.10 <0.10  MG  --   --   --   --  2.5* 2.4 2.3  PHOS  --   --   --   --  3.5 2.8 3.2  CALCIUM  --  8.4* 8.7*  --  8.6* 8.9 9.3      Recent Labs  Lab 10/18/23 0223 10/19/23 0321 10/19/23 0441 10/20/23 0319 10/21/23 0316 10/22/23 0633  CRP  --   --  6.1* 4.7* 3.7* 3.6*  PROCALCITON  --   --  <0.10 <0.10 <0.10 <0.10  MG  --   --   --  2.5* 2.4 2.3  CALCIUM 8.4* 8.7*  --  8.6* 8.9 9.3    --------------------------------------------------------------------------------------------------------------- Lab Results  Component Value Date   CHOL 161 09/29/2023   HDL 28 (L) 09/29/2023   LDLCALC 95 09/29/2023   TRIG 219 (H) 09/30/2023   CHOLHDL 5.8 09/29/2023    Lab Results  Component Value Date   HGBA1C 10.8 (H) 09/29/2023     Micro Results No results found for this or any previous visit (from the past 240 hours).   Radiology Reports  DG Chest Port 1 View Result Date: 10/23/2023 CLINICAL DATA:  Chest pain EXAM: PORTABLE CHEST 1 VIEW COMPARISON:  Four days prior FINDINGS: A tracheostomy tube is well seated. The feeding tube at least reaches the diaphragm. Cardiomegaly. Normal mediastinal contours allowing for rotation. Indistinct density at the bases. No edema, effusion, or pneumothorax. IMPRESSION: Stable low volume chest. Electronically Signed   By: Ronnette Coke M.D.   On: 10/23/2023 07:00   Signature  -   Lynnwood Sauer M.D on 10/23/2023 at 7:56 AM   -  To page go to [email or web address]

## 2023-10-23 NOTE — Progress Notes (Signed)
 Pt back in bed at this time for nap. PMV removed. Unable to locate purple PMV cup in room. PMV placed in clear plastic bag and taped to wall next to [facility] trach sheet. Pt aware of it's location.

## 2023-10-23 NOTE — Progress Notes (Signed)
 IR aware of request for perc G-tube. Chart and imaging reviewed. Pt with recent CVA, has been on Plavix 75 mg daily and ASA 81 mg daily. Normally recommend holding both 5 days. Neuro consulted and is okay with holding Plavix 5 days but requests continue ASA 81mg .  Imaging reviewed, will check CT abdomen to assess anatomy for safe percutaneous window.  Plavix being held 5 days, last dose yesterday. ASA will continue, acceptable risk.  Will review new CT and tentatively plan for G-tube placement on Wednesday 4/16.   Prudence Brown PA-C Interventional Radiology 10/23/2023 11:23 AM

## 2023-10-23 NOTE — Plan of Care (Signed)
  Problem: Clinical Measurements: Goal: Ability to maintain clinical measurements within normal limits will improve Outcome: Progressing Goal: Will remain free from infection Outcome: Progressing Goal: Respiratory complications will improve Outcome: Progressing   Problem: Safety: Goal: Ability to remain free from injury will improve Outcome: Progressing

## 2023-10-23 NOTE — Plan of Care (Signed)
  Problem: Health Behavior/Discharge Planning: Goal: Ability to identify and utilize available resources and services will improve Outcome: Progressing Goal: Ability to manage health-related needs will improve Outcome: Progressing   Problem: Metabolic: Goal: Ability to maintain appropriate glucose levels will improve Outcome: Progressing   Problem: Nutritional: Goal: Maintenance of adequate nutrition will improve Outcome: Progressing Goal: Progress toward achieving an optimal weight will improve Outcome: Progressing   Problem: Skin Integrity: Goal: Risk for impaired skin integrity will decrease Outcome: Progressing   Problem: Tissue Perfusion: Goal: Adequacy of tissue perfusion will improve Outcome: Progressing   Problem: Education: Goal: Knowledge of General Education information will improve Description: Including pain rating scale, medication(s)/side effects and non-pharmacologic comfort measures Outcome: Progressing   Problem: Health Behavior/Discharge Planning: Goal: Ability to manage health-related needs will improve Outcome: Progressing   Problem: Clinical Measurements: Goal: Ability to maintain clinical measurements within normal limits will improve Outcome: Progressing Goal: Will remain free from infection Outcome: Progressing Goal: Diagnostic test results will improve Outcome: Progressing Goal: Respiratory complications will improve Outcome: Progressing Goal: Cardiovascular complication will be avoided Outcome: Progressing   Problem: Activity: Goal: Risk for activity intolerance will decrease Outcome: Progressing   Problem: Nutrition: Goal: Adequate nutrition will be maintained Outcome: Progressing   Problem: Coping: Goal: Level of anxiety will decrease Outcome: Progressing   Problem: Elimination: Goal: Will not experience complications related to bowel motility Outcome: Progressing Goal: Will not experience complications related to urinary  retention Outcome: Progressing   Problem: Pain Managment: Goal: General experience of comfort will improve and/or be controlled Outcome: Progressing   Problem: Safety: Goal: Ability to remain free from injury will improve Outcome: Progressing   Problem: Skin Integrity: Goal: Risk for impaired skin integrity will decrease Outcome: Progressing   Problem: Activity: Goal: Ability to tolerate increased activity will improve Outcome: Progressing   Problem: Respiratory: Goal: Ability to maintain adequate ventilation will improve Outcome: Progressing Goal: Ability to maintain a clear airway will improve Outcome: Progressing   Problem: Education: Goal: Knowledge of disease or condition will improve Outcome: Progressing Goal: Knowledge of secondary prevention will improve (MUST DOCUMENT ALL) Outcome: Progressing Goal: Knowledge of patient specific risk factors will improve (DELETE if not current risk factor) Outcome: Progressing   Problem: Self-Care: Goal: Ability to participate in self-care as condition permits will improve Outcome: Progressing Goal: Verbalization of feelings and concerns over difficulty with self-care will improve Outcome: Progressing Goal: Ability to communicate needs accurately will improve Outcome: Progressing

## 2023-10-24 DIAGNOSIS — J189 Pneumonia, unspecified organism: Secondary | ICD-10-CM | POA: Diagnosis not present

## 2023-10-24 LAB — BASIC METABOLIC PANEL WITH GFR
Anion gap: 7 (ref 5–15)
BUN: 16 mg/dL (ref 6–20)
CO2: 33 mmol/L — ABNORMAL HIGH (ref 22–32)
Calcium: 9.4 mg/dL (ref 8.9–10.3)
Chloride: 97 mmol/L — ABNORMAL LOW (ref 98–111)
Creatinine, Ser: 1.12 mg/dL (ref 0.61–1.24)
GFR, Estimated: >60 mL/min (ref >60)
Glucose, Bld: 69 mg/dL — ABNORMAL LOW (ref 70–99)
Potassium: 4.4 mmol/L (ref 3.5–5.1)
Sodium: 137 mmol/L (ref 135–145)

## 2023-10-24 LAB — CBC WITH DIFFERENTIAL/PLATELET
Abs Immature Granulocytes: 0.03 10*3/uL (ref 0.00–0.07)
Basophils Absolute: 0 10*3/uL (ref 0.0–0.1)
Basophils Relative: 1 %
Eosinophils Absolute: 0.1 10*3/uL (ref 0.0–0.5)
Eosinophils Relative: 2 %
HCT: 31.8 % — ABNORMAL LOW (ref 39.0–52.0)
Hemoglobin: 10.2 g/dL — ABNORMAL LOW (ref 13.0–17.0)
Immature Granulocytes: 1 %
Lymphocytes Relative: 43 %
Lymphs Abs: 2.8 10*3/uL (ref 0.7–4.0)
MCH: 29.4 pg (ref 26.0–34.0)
MCHC: 32.1 g/dL (ref 30.0–36.0)
MCV: 91.6 fL (ref 80.0–100.0)
Monocytes Absolute: 0.9 10*3/uL (ref 0.1–1.0)
Monocytes Relative: 14 %
Neutro Abs: 2.4 10*3/uL (ref 1.7–7.7)
Neutrophils Relative %: 39 %
Platelets: 455 10*3/uL — ABNORMAL HIGH (ref 150–400)
RBC: 3.47 MIL/uL — ABNORMAL LOW (ref 4.22–5.81)
RDW: 15.5 % (ref 11.5–15.5)
WBC: 6.3 10*3/uL (ref 4.0–10.5)
nRBC: 0 % (ref 0.0–0.2)

## 2023-10-24 LAB — GLUCOSE, CAPILLARY
Glucose-Capillary: 108 mg/dL — ABNORMAL HIGH (ref 70–99)
Glucose-Capillary: 128 mg/dL — ABNORMAL HIGH (ref 70–99)
Glucose-Capillary: 131 mg/dL — ABNORMAL HIGH (ref 70–99)
Glucose-Capillary: 194 mg/dL — ABNORMAL HIGH (ref 70–99)
Glucose-Capillary: 60 mg/dL — ABNORMAL LOW (ref 70–99)
Glucose-Capillary: 84 mg/dL (ref 70–99)
Glucose-Capillary: 92 mg/dL (ref 70–99)

## 2023-10-24 LAB — PROCALCITONIN: Procalcitonin: <0.1 ng/mL

## 2023-10-24 MED ORDER — INSULIN ASPART 100 UNIT/ML IJ SOLN
8.0000 [IU] | INTRAMUSCULAR | Status: DC
  Administered 2023-10-26 – 2023-11-09 (×26): 8 [IU] via SUBCUTANEOUS

## 2023-10-24 MED ORDER — INSULIN GLARGINE-YFGN 100 UNIT/ML ~~LOC~~ SOLN
30.0000 [IU] | Freq: Two times a day (BID) | SUBCUTANEOUS | Status: DC
  Administered 2023-10-24 – 2023-10-26 (×3): 30 [IU] via SUBCUTANEOUS
  Filled 2023-10-24 (×8): qty 0.3

## 2023-10-24 MED ORDER — DEXTROSE 50 % IV SOLN
12.5000 g | INTRAVENOUS | Status: AC
  Administered 2023-10-24: 12.5 g via INTRAVENOUS

## 2023-10-24 MED ORDER — DEXTROSE 50 % IV SOLN
INTRAVENOUS | Status: AC
  Filled 2023-10-24: qty 50

## 2023-10-24 NOTE — Progress Notes (Signed)
 PROGRESS NOTE   Jeremiah Stevens  ZOX:[id number]    DOB: Feb 05, 1976    DOA: 09/26/2023  PCP: [name], MD   I have briefly reviewed patients previous medical records in [facility].  Chief Complaint  Patient presents with   Dysphagia    Brief Hospital Course:  48 y.o. adult who with medical history including but not limited to DM2, seizures, HIV AIDS untreated, cryptococcal disease, obesity, polysubstance abuse, prior CVA. She was admitted 09/26/23 with dysphagia x 3 days along with headaches and drooling. He was found to be hypoxic to 80s on room air. CTA was negative for PE but showed multifocal PNA. He was started on abx and BD's. Biktarvy was continued and ID was consulted. 3/18 He had right facial droop, right sided weakness, dysarthria. Neurology was consulted and recommended CTA and MRI. MRI demonstrated acute right medullary infarct. Despite ongoing dysphagia (MBS showed moderately severe pharyngeal phase dysphagia and imaging showed unilateral bulging on right), pt insisted on eating. He reportedly ordered pizza overnight 3/18 and certainly aspirated. He had threatened to leave AMA despite stroke workup ongoing. 3/19, he had hypoxia for which rapid response was called. While being assess, he continued to desaturate down into the 60s and then 40s. He required BVM and was transferred to the ICU for further evaluation and management. Upon arrival in the ICU, she had ongoing desaturations and bradycardia before developing PEA arrest. He had 9 minutes ACLS prior to ROSC including epi x 4 and bicarb x 2. During intubation attempt, she had copious food material and emesis in her oropharynx. Visualization of vocal cords was difficult due to amount of food material and emesis despite suctioning. On 2nd attempt, ETT was passed successfully and saturations improved shortly thereafter. There was grand mal seizure activity noted after intubation that abated after Midazolam  administration.  Significant hospital events:  3/16 admit 3/18 neuro consult, found to have acute CVA 3/19 Ongoing aspiration despite obvious dysphagia, was planning to leave AMA but sister talked pt into staying. then had a massive aspiration event leading to worse hypoxia and PEA arrest. Txf to [facility] 3/20 PSV cumulative total of 5-6 hrs, following some commands/ purposefully 3/21 self extubated, was reintubated 3/23 Failed SBT due to WOB 3/24 Failed SBT due to apnea 3/25 Tolerated PS >4 hours 3/26: Intubated. 3/31: S/p tracheostomy with ENT. 4/4: trach collar starting 1200  4/5 cont on trach collar  4/7: Care transferred from [facility] to [facility] 4/8: Modified barium swallow.  Failed.  NPO.   Assessment & Plan:   Acute hypoxic resp failure - S/p trach - Multifocal Aspiration PNA - Aspiration event- Hx tobacco use - Dysphagia Tolerated trach collar trial off the vent, with mildly increased FiO2 requirement 25-40% on 4/6 . S/P a one-time dose of IV Lasix trial to improve the respiratory status on 4/6.   Currently without tachypnea, saturating in the high 90s on trach collar FiO2 35%, 10 L/min.  Discussed with RN at bedside to attempt to wean. Tracheostomy management per PCCM and ENT Dr. Donalee Fruits.  Case discussed with Dr. Donalee Fruits on 10/20/2023, has deferred trach changes to RT.  He will be available as needed. Patient currently has #8 Shiley cuffed trach as of 10/20/2023, discussed with ENT, ENT will see the patient likely on 10/25/2023 now for trach change, they had some scheduling and logistical issues earlier.   -GPC on trach asp 10/12/23, on cefepime #7/7 -SLP following for dysphagia and PMV valve use. -added CPT  -Unfortunately does  not have LTAC benefits    Dysphagia.  Still on NG tube feeds.  Speech therapy following unfortunately failed another modified barium swallow on 10/19/2023.  Will monitor for another few days if continues to fail swallow evaluations will require a PEG tube.  Patient is  sitting up in the recliner for several hours now, will try to have speech therapy reevaluate her once she is sitting up in recliner in hopes of her doing slightly better on her swallow eval. Patient now wants a PEG tube, IR consulted on 10/23/2023.  Plavix to be resumed when cleared by IR post PEG Note last dose of Plavix is 10/23/2023 which will be held for 5 days, it should be resumed 1 day after PEG tube is placed and cleared by IR.  Continue daily aspirin, stroke team was consulted and this has been done after their consent.  Discussed with Dr. Christiane Cowing.  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  Recommending LTAC. -ASA, statin, seen by neurology this admission.   Acute encephalopathy Anxiety, depression  P -minimize CNS depressing meds -delirium precautions  -Encephalopathy has resolved.   PEA arrest  -?Sz like activity following, stable echocardiogram with preserved EF no wall motion abnormality. P -supportive care    HFpEF EF 65% Hx HTN HLD -statin, dilt, metop PRN hydral and PRN labetalol  Blood pressure is controlled.   Normocytic anemia -Follow intermittent CBC.  Hemoglobin stable in the 8-9 g range.   HIV / AIDS Hx +cryptococcal antigen  P -Seen by ID currently on following regimen per ID, post discharge follow-up with ID in the office.  Previously untreated.  Counseled on compliance.  -Continue p.o. fluconazole 400 mg daily on dischare QTc 385 -Continue Descovy/Tivicay  -Continue atovaquone for PJP prophylaxis  DM2, with hyperglycemia, on steroids -cont basal + SSI.  Good control on current regimen.  CBG (last 3)  Recent Labs    10/23/23 2310 10/24/23 0403 10/24/23 0811  GLUCAP 82 194* 60*   Lab Results  Component Value Date   HGBA1C 10.8 (H) 09/29/2023    Code status  - Full  Medical noncompliance.   As per chart review and detailed discussion with overnight ICU RN, patient has medical  decision making capacity but is very noncompliant-pulled out feeding tube, attempts to pull out tracheostomy, sickly counseled on being compliant, explained the risk of sudden aspiration, respiratory arrest, death.      Body mass index is 33.7 kg/m.  Obesity.  Follow with PCP.    Nutritional Status Nutrition Problem: Increased nutrient needs Etiology: chronic illness Signs/Symptoms: estimated needs Interventions: Tube feeding, Prostat   DVT prophylaxis: SCDs Start: 09/27/23 0018.  Lovenox   Code Status: Full Code:  Family Communication: Father Archie Bearded on 10/21/23 - [phone] Disposition:  Status is: Inpatient  Consultants:   ENT Psychiatry PCCM Neurology Infectious disease  Subjective:   Patient in bed, appears comfortable, denies any headache, no fever, no chest pain or pressure, no shortness of breath , no abdominal pain. No new focal weakness. Wants to get PEG tube and NG-tube removed.   Objective:   Vitals:   10/24/23 0350 10/24/23 0500 10/24/23 0510 10/24/23 0511  BP:   (!) 140/82 (!) 140/82  Pulse: 78  71   Resp: 12  19   Temp:   98.1 F (36.7 C)   TempSrc:   Oral   SpO2: 95%  94%   Weight:  112.7 kg  Height:        Awake Alert, No new F.N deficits, NG tube in place, tracheostomy site stable with trach collar [place].AT,PERRAL Supple Neck, No JVD,   Symmetrical Chest wall movement, Good air movement bilaterally, CTAB RRR,No Gallops, Rubs or new Murmurs,  +ve B.Sounds, Abd Soft, No tenderness,   No Cyanosis, Clubbing or edema   Data Reviewed:   I have personally reviewed following labs and imaging studies    Data Review:   Inpatient Medications  Scheduled Meds:  aspirin  81 mg Per Tube Daily   atorvastatin  20 mg Per Tube QHS   atovaquone  1,500 mg Per Tube Q lunch   Chlorhexidine Gluconate Cloth  6 each Topical Daily   [START ON 10/28/2023] clopidogrel  75 mg Per Tube Daily   dextrose  12.5 g Intravenous STAT   diltiazem  60 mg Per Tube Q6H    dolutegravir  50 mg Per Tube Daily   emtricitabine-tenofovir  1 tablet Per Tube Daily   enoxaparin (LOVENOX) injection  50 mg Subcutaneous Q24H   feeding supplement (PROSource TF20)  60 mL Per Tube Daily   fiber supplement (BANATROL TF)  60 mL Per Tube BID   fluconazole  400 mg Per Tube Daily   free water  100 mL Per Tube Q4H   Gerhardt's butt cream   Topical BID   insulin aspart  0-20 Units Subcutaneous Q4H   insulin aspart  8 Units Subcutaneous 4 times per day   insulin glargine-yfgn  30 Units Subcutaneous BID   metoprolol tartrate  25 mg Per Tube BID   multivitamin with minerals  1 tablet Per Tube Daily   nicotine  14 mg Transdermal Daily   nystatin  5 mL Oral QID   mouth rinse  15 mL Mouth Rinse Q2H   pantoprazole (PROTONIX) IV  40 mg Intravenous Q12H   PARoxetine  20 mg Per Tube Daily   QUEtiapine  50 mg Per Tube BID   Continuous Infusions:  dextrose Stopped (10/23/23 0856)   feeding supplement (OSMOLITE 1.5 CAL) Stopped (10/24/23 0612)   PRN Meds:.acetaminophen **OR** acetaminophen, alum & mag hydroxide-simeth, bisacodyl, dextrose, fentaNYL (SUBLIMAZE) injection, hydrALAZINE, hydrOXYzine, labetalol, levalbuterol, loperamide HCl, melatonin, ondansetron **OR** ondansetron (ZOFRAN) IV, mouth rinse, oxyCODONE, polyethylene glycol  Recent Labs  Lab 10/19/23 0321 10/20/23 0319 10/21/23 0316 10/22/23 0633 10/23/23 0645  WBC 4.5 3.9* 3.7* 5.0 4.2  HGB 9.0* 8.3* 8.8* 9.4* 9.5*  HCT 28.3* 26.3* 27.8* 29.7* 30.0*  PLT 363 361 392 404* 412*  MCV 92.5 92.6 92.7 92.5 92.6  MCH 29.4 29.2 29.3 29.3 29.3  MCHC 31.8 31.6 31.7 31.6 31.7  RDW 14.9 15.1 15.2 15.4 15.4  LYMPHSABS  --  0.6* 0.5* 1.0 0.7  MONOABS  --  0.5 0.4 0.6 0.5  EOSABS  --  0.1 0.1 0.1 0.1  BASOSABS  --  0.0 0.0 0.0 0.0    Recent Labs  Lab 10/19/23 0321 10/19/23 0441 10/20/23 0319 10/21/23 0316 10/22/23 0633 10/23/23 0645  NA 135  --  136 139 139 139  K 5.2*  --  4.4 4.4 4.5 4.6  CL 98  --  98 99 101 96*   CO2 28  --  31 30 31  32  ANIONGAP 9  --  7 10 7 11   GLUCOSE 90  --  135* 91 84 108*  BUN 20  --  17 13 15 15   CREATININE 1.07  --  0.97 0.92 1.08 1.01  CRP  --  6.1* 4.7* 3.7* 3.6* 2.3*  PROCALCITON  --  <0.10 <0.10 <0.10 <0.10 <0.10  MG  --   --  2.5* 2.4 2.3 2.4  PHOS  --   --  3.5 2.8 3.2 3.1  CALCIUM 8.7*  --  8.6* 8.9 9.3 9.0      Recent Labs  Lab 10/19/23 0321 10/19/23 0441 10/20/23 0319 10/21/23 0316 10/22/23 0633 10/23/23 0645  CRP  --  6.1* 4.7* 3.7* 3.6* 2.3*  PROCALCITON  --  <0.10 <0.10 <0.10 <0.10 <0.10  MG  --   --  2.5* 2.4 2.3 2.4  CALCIUM 8.7*  --  8.6* 8.9 9.3 9.0    --------------------------------------------------------------------------------------------------------------- Lab Results  Component Value Date   CHOL 161 09/29/2023   HDL 28 (L) 09/29/2023   LDLCALC 95 09/29/2023   TRIG 219 (H) 09/30/2023   CHOLHDL 5.8 09/29/2023    Lab Results  Component Value Date   HGBA1C 10.8 (H) 09/29/2023     Micro Results No results found for this or any previous visit (from the past 240 hours).   Radiology Reports  DG Chest Port 1 View Result Date: 10/23/2023 CLINICAL DATA:  Chest pain EXAM: PORTABLE CHEST 1 VIEW COMPARISON:  Four days prior FINDINGS: A tracheostomy tube is well seated. The feeding tube at least reaches the diaphragm. Cardiomegaly. Normal mediastinal contours allowing for rotation. Indistinct density at the bases. No edema, effusion, or pneumothorax. IMPRESSION: Stable low volume chest. Electronically Signed   By: Ronnette Coke M.D.   On: 10/23/2023 07:00   Signature  -   Lynnwood Sauer M.D on 10/24/2023 at 8:19 AM   -  To page go to [email or web address]

## 2023-10-24 NOTE — Plan of Care (Signed)
  Problem: Health Behavior/Discharge Planning: Goal: Ability to manage health-related needs will improve Outcome: Progressing   Problem: Skin Integrity: Goal: Risk for impaired skin integrity will decrease Outcome: Progressing   Problem: Clinical Measurements: Goal: Respiratory complications will improve Outcome: Progressing   Problem: Safety: Goal: Ability to remain free from injury will improve Outcome: Progressing

## 2023-10-25 DIAGNOSIS — J189 Pneumonia, unspecified organism: Secondary | ICD-10-CM | POA: Diagnosis not present

## 2023-10-25 DIAGNOSIS — I639 Cerebral infarction, unspecified: Secondary | ICD-10-CM | POA: Diagnosis not present

## 2023-10-25 DIAGNOSIS — I469 Cardiac arrest, cause unspecified: Secondary | ICD-10-CM | POA: Diagnosis not present

## 2023-10-25 DIAGNOSIS — J9601 Acute respiratory failure with hypoxia: Secondary | ICD-10-CM | POA: Diagnosis not present

## 2023-10-25 DIAGNOSIS — B20 Human immunodeficiency virus [HIV] disease: Secondary | ICD-10-CM | POA: Diagnosis not present

## 2023-10-25 LAB — GLUCOSE, CAPILLARY
Glucose-Capillary: 110 mg/dL — ABNORMAL HIGH (ref 70–99)
Glucose-Capillary: 111 mg/dL — ABNORMAL HIGH (ref 70–99)
Glucose-Capillary: 130 mg/dL — ABNORMAL HIGH (ref 70–99)
Glucose-Capillary: 146 mg/dL — ABNORMAL HIGH (ref 70–99)
Glucose-Capillary: 183 mg/dL — ABNORMAL HIGH (ref 70–99)
Glucose-Capillary: 66 mg/dL — ABNORMAL LOW (ref 70–99)
Glucose-Capillary: 99 mg/dL (ref 70–99)

## 2023-10-25 MED ORDER — DEXTROSE 50 % IV SOLN: 12.5000 g | INTRAVENOUS | Status: AC

## 2023-10-25 MED ORDER — DEXTROSE 50 % IV SOLN
INTRAVENOUS | Status: AC
Start: 2023-10-25 — End: 2023-10-25
  Administered 2023-10-25: 12.5 g via INTRAVENOUS
  Filled 2023-10-25: qty 50

## 2023-10-25 NOTE — Progress Notes (Signed)
 Speech Language Pathology Treatment: Dysphagia  Patient Details Name: Jeremiah Stevens MRN: [id number] DOB: [birth year] Today's Date: 10/25/2023 Time: 0930-1000 SLP Time Calculation (min) (ACUTE ONLY): 30 min  Assessment / Plan / Recommendation Clinical Impression  Pt demonstrates improvement in general strength today. She is participating very well today, swallowing ice with effort with ongoing signs of aspiration (coughing, wet vocal quality, expectoration). However today, she is able to apply more effort to task. Pt needs to swallow with effort as frequently as can be tolerated and for the most part, she shows potential to participate well today. She says she has been doing her RMT exercises independently and performed a set of 30 repetitions for me. When the pressure was increased the air pushed out of the stoma instead of through the device, so we stayed at 20 cm H20. Hopeful that the trach change to 6 cuffless will improve efficacy of cough to clear upper airway secretions and placement of PEG will slightly improve swallowing due to removal of NG tube. These combined could allow a little bit of progress that would afford Jeremiah Stevens a greater ability to swallow for therapeutic purposes and for QOL. PEG absolutely needed given ongoing severity of dysphagia. Had to again counsel pt on safety since she told me she drank a full cup of water yesterday (ice melted). Used teach back to ensure verbal comprehension though pt has difficulty with memory and awareness.    HPI HPI: Jeremiah Stevens is a 48 y.o. adult who identifies as male, admitted 09/26/23 with dysphagia x 3 days along with headaches and drooling. She was found to be hypoxic to 80s on room air. CTA showed multifocal PNA.  3/18 she had right facial droop, right sided weakness, dysarthria. MRI demonstrated acute right medullary infarct. Despite ongoing dysphagia (MBS showed moderately severe pharyngeal phase dysphagia and imaging showed  unilateral bulging on right), pt insisted on eating. She reportedly ordered pizza overnight 3/18 and certainly aspirated, desaturated, ultimately suffered PEA arrest, 9 minutes ACLS, copious food material and emesis in oropharynx. Self extubated 3/21, reintubated and extubated 3/26, Trach on 3/31, ATC on 4/4.      SLP Plan  Continue with current plan of care      Recommendations for follow up therapy are one component of a multi-disciplinary discharge planning process, led by the attending physician.  Recommendations may be updated based on patient status, additional functional criteria and insurance authorization.    Recommendations  Diet recommendations: NPO;Other(comment) (ice chips) Liquids provided via: Teaspoon      Patient may use Passy-Muir Speech Valve: During all waking hours (remove during sleep) PMSV Supervision: Intermittent MD: Please consider changing trach tube to : Smaller size;Cuffless           Oral care QID   Frequent or constant Supervision/Assistance Dysphagia, oropharyngeal phase (R13.12)     Continue with current plan of care     Jeremiah Stevens, Jeremiah Stevens  10/25/2023, 10:23 AM

## 2023-10-25 NOTE — Progress Notes (Signed)
 Tracheostomy changed to #6 cuffless without difficulty.  Able to phonate very well with PMV .  Can continue tracheostomy care at the tracheostomy clinic.  Follow-up with me as needed.

## 2023-10-25 NOTE — Progress Notes (Addendum)
 PROGRESS NOTE   Jeremiah Stevens  XLK:[id number]    DOB: April 14, 1976    DOA: 09/26/2023  PCP: [name], MD   I have briefly reviewed patients previous medical records in [facility].  Chief Complaint  Patient presents with   Dysphagia    Brief Hospital Course:  48 y.o. adult who with medical history including but not limited to DM2, seizures, HIV AIDS untreated, cryptococcal disease, obesity, polysubstance abuse, prior CVA. She was admitted 09/26/23 with dysphagia x 3 days along with headaches and drooling. He was found to be hypoxic to 80s on room air. CTA was negative for PE but showed multifocal PNA. He was started on abx and BD's. Biktarvy was continued and ID was consulted. 3/18 He had right facial droop, right sided weakness, dysarthria. Neurology was consulted and recommended CTA and MRI. MRI demonstrated acute right medullary infarct. Despite ongoing dysphagia (MBS showed moderately severe pharyngeal phase dysphagia and imaging showed unilateral bulging on right), pt insisted on eating. He reportedly ordered pizza overnight 3/18 and certainly aspirated. He had threatened to leave AMA despite stroke workup ongoing. 3/19, he had hypoxia for which rapid response was called. While being assess, he continued to desaturate down into the 60s and then 40s. He required BVM and was transferred to the ICU for further evaluation and management. Upon arrival in the ICU, she had ongoing desaturations and bradycardia before developing PEA arrest. He had 9 minutes ACLS prior to ROSC including epi x 4 and bicarb x 2. During intubation attempt, she had copious food material and emesis in her oropharynx. Visualization of vocal cords was difficult due to amount of food material and emesis despite suctioning. On 2nd attempt, ETT was passed successfully and saturations improved shortly thereafter. There was grand mal seizure activity noted after intubation that abated after Midazolam  administration.  Significant hospital events:  3/16 admit 3/18 neuro consult, found to have acute CVA 3/19 Ongoing aspiration despite obvious dysphagia, was planning to leave AMA but sister talked pt into staying. then had a massive aspiration event leading to worse hypoxia and PEA arrest. Txf to [facility] 3/20 PSV cumulative total of 5-6 hrs, following some commands/ purposefully 3/21 self extubated, was reintubated 3/23 Failed SBT due to WOB 3/24 Failed SBT due to apnea 3/25 Tolerated PS >4 hours 3/26: Intubated. 3/31: S/p tracheostomy with ENT. 4/4: trach collar starting 1200  4/5 cont on trach collar  4/7: Care transferred from [facility] to [facility] 4/8: Modified barium swallow.  Failed.  NPO.   Assessment & Plan:   Acute hypoxic resp failure - S/p trach - Multifocal Aspiration PNA - Aspiration event- Hx tobacco use - Dysphagia Tolerated trach collar trial off the vent, with mildly increased FiO2 requirement 25-40% on 4/6 . S/P a one-time dose of IV Lasix trial to improve the respiratory status on 4/6.   Currently without tachypnea, saturating in the high 90s on trach collar FiO2 35%, 10 L/min.  Discussed with RN at bedside to attempt to wean. Tracheostomy management per PCCM and ENT Dr. Donalee Fruits.  Case discussed with Dr. Donalee Fruits on 10/20/2023, has deferred trach changes to RT.  He will be available as needed. Patient currently has #8 Shiley cuffed trach as of 10/20/2023, discussed with ENT, ENT will see the patient likely on 10/25/2023 now for trach change, they had some scheduling and logistical issues earlier.   -GPC on trach asp 10/12/23, on cefepime #7/7 -SLP following for dysphagia and PMV valve use. -added CPT  -Unfortunately does  not have LTAC benefits    Dysphagia.  Still on NG tube feeds.  Speech therapy following unfortunately failed another modified barium swallow on 10/19/2023.  Will monitor for another few days if continues to fail swallow evaluations will require a PEG tube.  Patient is  sitting up in the recliner for several hours now, will try to have speech therapy reevaluate her once she is sitting up in recliner in hopes of her doing slightly better on her swallow eval. Patient now wants a PEG tube, IR consulted on 10/23/2023.  Plavix to be resumed when cleared by IR post PEG Note last dose of Plavix is 10/23/2023 which will be held for 5 days, it should be resumed 1 day after PEG tube is placed and cleared by IR.  Continue daily aspirin, stroke team was consulted and this has been done after their consent.  Discussed with Dr. Christiane Cowing.  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  Recommending LTAC. -ASA, statin, seen by neurology this admission.   Acute encephalopathy Anxiety, depression  P -minimize CNS depressing meds -delirium precautions  -Encephalopathy has resolved.   PEA arrest  -?Sz like activity following, stable echocardiogram with preserved EF no wall motion abnormality. P -supportive care    HFpEF EF 65% Hx HTN HLD -statin, dilt, metop PRN hydral and PRN labetalol  Blood pressure is controlled.   Normocytic anemia -Follow intermittent CBC.  Hemoglobin stable in the 8-9 g range.   HIV / AIDS Hx +cryptococcal antigen  P -Seen by ID currently on following regimen per ID, post discharge follow-up with ID in the office.  Previously untreated.  Counseled on compliance.  -Continue p.o. fluconazole 400 mg daily on dischare QTc 385 -Continue Descovy/Tivicay  -Continue atovaquone for PJP prophylaxis  DM2, with hyperglycemia, on steroids -cont basal + SSI.  Dosage adjusted on 10/24/2023 as patient was getting transiently hypoglycemic.  CBG (last 3)  Recent Labs    10/24/23 1948 10/24/23 2338 10/25/23 0426  GLUCAP 131* 128* 146*   Lab Results  Component Value Date   HGBA1C 10.8 (H) 09/29/2023    Code status  - Full  Medical noncompliance.   As per chart review and detailed  discussion with overnight ICU RN, patient has medical decision making capacity but is very noncompliant-pulled out feeding tube, attempts to pull out tracheostomy, sickly counseled on being compliant, explained the risk of sudden aspiration, respiratory arrest, death.  Patient swallowed a whole cup of water against advice on 10/24/2023, warned again of consequences being aspiration, pneumonia, sudden death.  She completely understands it and says will not do it again.    Body mass index is 33.7 kg/m.  Obesity.  Follow with PCP.    Nutritional Status Nutrition Problem: Increased nutrient needs Etiology: chronic illness Signs/Symptoms: estimated needs Interventions: Tube feeding, Prostat   DVT prophylaxis: SCDs Start: 09/27/23 0018.  Lovenox   Code Status: Full Code:  Family Communication:   Father Archie Bearded  [phone] on 10/21/23   Sister Korie [phone] on 10/25/2023 at 7:53 AM message left  Disposition:  Status is: Inpatient  Consultants:   ENT Psychiatry PCCM Neurology Infectious disease  Subjective:   Patient in bed, appears comfortable, denies any headache, no fever, no chest pain or pressure, no shortness of breath , no abdominal pain. No new focal weakness. Wants to get PEG tube and NG-tube removed.   Objective:   Vitals:   10/25/23 0001 10/25/23 0429 10/25/23  0441 10/25/23 0451  BP: 119/79 108/60 108/60   Pulse:  75    Resp:  19    Temp:  99 F (37.2 C)    TempSrc:  Oral    SpO2:  94%    Weight:    112.7 kg  Height:        Awake Alert, No new F.N deficits, NG tube in place, tracheostomy site stable with trach collar Artemus.AT,PERRAL Supple Neck, No JVD,   Symmetrical Chest wall movement, Good air movement bilaterally, CTAB RRR,No Gallops, Rubs or new Murmurs,  +ve B.Sounds, Abd Soft, No tenderness,   No Cyanosis, Clubbing or edema   Data Reviewed:   I have personally reviewed following labs and imaging studies    Data Review:   Inpatient  Medications  Scheduled Meds:  aspirin  81 mg Per Tube Daily   atorvastatin  20 mg Per Tube QHS   atovaquone  1,500 mg Per Tube Q lunch   Chlorhexidine Gluconate Cloth  6 each Topical Daily   [START ON 10/28/2023] clopidogrel  75 mg Per Tube Daily   diltiazem  60 mg Per Tube Q6H   dolutegravir  50 mg Per Tube Daily   emtricitabine-tenofovir  1 tablet Per Tube Daily   enoxaparin (LOVENOX) injection  50 mg Subcutaneous Q24H   feeding supplement (PROSource TF20)  60 mL Per Tube Daily   fiber supplement (BANATROL TF)  60 mL Per Tube BID   fluconazole  400 mg Per Tube Daily   free water  100 mL Per Tube Q4H   Gerhardt's butt cream   Topical BID   insulin aspart  0-20 Units Subcutaneous Q4H   insulin aspart  8 Units Subcutaneous 4 times per day   insulin glargine-yfgn  30 Units Subcutaneous BID   metoprolol tartrate  25 mg Per Tube BID   multivitamin with minerals  1 tablet Per Tube Daily   nicotine  14 mg Transdermal Daily   nystatin  5 mL Oral QID   mouth rinse  15 mL Mouth Rinse Q2H   pantoprazole (PROTONIX) IV  40 mg Intravenous Q12H   PARoxetine  20 mg Per Tube Daily   QUEtiapine  50 mg Per Tube BID   Continuous Infusions:  dextrose Stopped (10/23/23 0856)   feeding supplement (OSMOLITE 1.5 CAL) Stopped (10/24/23 0612)   PRN Meds:.acetaminophen **OR** acetaminophen, alum & mag hydroxide-simeth, bisacodyl, dextrose, fentaNYL (SUBLIMAZE) injection, hydrALAZINE, hydrOXYzine, labetalol, levalbuterol, loperamide HCl, melatonin, ondansetron **OR** ondansetron (ZOFRAN) IV, mouth rinse, oxyCODONE, polyethylene glycol  Recent Labs  Lab 10/20/23 0319 10/21/23 0316 10/22/23 0633 10/23/23 0645 10/24/23 0810  WBC 3.9* 3.7* 5.0 4.2 6.3  HGB 8.3* 8.8* 9.4* 9.5* 10.2*  HCT 26.3* 27.8* 29.7* 30.0* 31.8*  PLT 361 392 404* 412* 455*  MCV 92.6 92.7 92.5 92.6 91.6  MCH 29.2 29.3 29.3 29.3 29.4  MCHC 31.6 31.7 31.6 31.7 32.1  RDW 15.1 15.2 15.4 15.4 15.5  LYMPHSABS 0.6* 0.5* 1.0 0.7 2.8   MONOABS 0.5 0.4 0.6 0.5 0.9  EOSABS 0.1 0.1 0.1 0.1 0.1  BASOSABS 0.0 0.0 0.0 0.0 0.0    Recent Labs  Lab 10/19/23 0441 10/20/23 0319 10/21/23 0316 10/22/23 0633 10/23/23 0645 10/24/23 0810  NA  --  136 139 139 139 137  K  --  4.4 4.4 4.5 4.6 4.4  CL  --  98 99 101 96* 97*  CO2  --  31 30 31  32 33*  ANIONGAP  --  7 10 7  11  7  GLUCOSE  --  135* 91 84 108* 69*  BUN  --  17 13 15 15 16   CREATININE  --  0.97 0.92 1.08 1.01 1.12  CRP 6.1* 4.7* 3.7* 3.6* 2.3*  --   PROCALCITON <0.10 <0.10 <0.10 <0.10 <0.10 <0.10  MG  --  2.5* 2.4 2.3 2.4  --   PHOS  --  3.5 2.8 3.2 3.1  --   CALCIUM  --  8.6* 8.9 9.3 9.0 9.4      Recent Labs  Lab 10/19/23 0441 10/20/23 0319 10/21/23 0316 10/22/23 0633 10/23/23 0645 10/24/23 0810  CRP 6.1* 4.7* 3.7* 3.6* 2.3*  --   PROCALCITON <0.10 <0.10 <0.10 <0.10 <0.10 <0.10  MG  --  2.5* 2.4 2.3 2.4  --   CALCIUM  --  8.6* 8.9 9.3 9.0 9.4    --------------------------------------------------------------------------------------------------------------- Lab Results  Component Value Date   CHOL 161 09/29/2023   HDL 28 (L) 09/29/2023   LDLCALC 95 09/29/2023   TRIG 219 (H) 09/30/2023   CHOLHDL 5.8 09/29/2023    Lab Results  Component Value Date   HGBA1C 10.8 (H) 09/29/2023     Micro Results No results found for this or any previous visit (from the past 240 hours).   Radiology Reports  CT ABDOMEN WO CONTRAST Result Date: 10/24/2023 CLINICAL DATA:  Cerebral infarction and need for percutaneous gastrostomy tube for long-term nutrition. Assessment abdominal and gastric anatomy for potential percutaneous gastrostomy tube placement. EXAM: CT ABDOMEN WITHOUT CONTRAST TECHNIQUE: Multidetector CT imaging of the abdomen was performed following the standard protocol without IV contrast. RADIATION DOSE REDUCTION: This exam was performed according to the departmental dose-optimization program which includes automated exposure control, adjustment of the  mA and/or kV according to patient size and/or use of iterative reconstruction technique. COMPARISON:  CTA chest 09/27/2023.  CT abdomen 08/02/2022. FINDINGS: Lower chest: Bibasilar atelectasis, right greater than left. Hepatobiliary: Hepatic steatosis. Normal and contracted gallbladder. No biliary ductal dilatation. Pancreas: Unremarkable. No pancreatic ductal dilatation or surrounding inflammatory changes. Spleen: Normal in size without focal abnormality. Adrenals/Urinary Tract: Normal adrenal glands and kidneys. No evidence of hydronephrosis or renal calculi. Stomach/Bowel: A feeding tube extends through the stomach and terminates in the second portion of the duodenum. No evidence of bowel obstruction, ileus or free intraperitoneal air. Gastric anatomy and positioning is somewhat borderline for percutaneous gastrostomy tube placement based on current CT but based on prior studies, insufflation of the stomach with air should provide a window between the left lobe of the liver and splenic flexure for save gastrostomy tube placement. This can be assessed at the time the procedure. Vascular/Lymphatic: No significant vascular findings are present. No enlarged abdominal or pelvic lymph nodes. Other: No abdominal wall hernia or abnormality. Musculoskeletal: No acute or significant osseous findings. IMPRESSION: 1. Gastric anatomy and positioning is somewhat borderline for percutaneous gastrostomy tube placement based on current CT but based on prior studies, insufflation of the stomach with air should provide a window between the left lobe of the liver and splenic flexure for gastrostomy tube placement. This can be assessed at the time the procedure. Barium can also be given the night before via the feeding tube in order to opacify the colon at the time possible gastrostomy tube placement. 2. Hepatic steatosis. 3. Bibasilar atelectasis, right greater than left. Electronically Signed   By: Irish Lack M.D.   On:  10/24/2023 11:00   Signature  -   Susa Raring M.D on 10/25/2023 at 7:48 AM   -  To page go to [email or web address]

## 2023-10-25 NOTE — Progress Notes (Signed)
 NAME:  Jeremiah Stevens, MRN:  [id number], DOB:  1975-12-06, LOS: 29 ADMISSION DATE:  09/26/2023, CONSULTATION DATE:  09/29/23 REFERRING MD:  Rennis Chris CHIEF COMPLAINT:  Hypoxia   History of Present Illness:  Pt is encephelopathic; therefore, this HPI is obtained from chart review. Jeremiah Stevens is a 48 y.o. adult who has a PMH as below including but not limited to DM2, seizures, HIV AIDS untreated, cryptococcal disease, obesity, polysubstance abuse, prior CVA. She was admitted 09/26/23 with dysphagia x 3 days along with headaches and drooling. He was found to be hypoxic to 80s on room air. CTA was negative for PE but showed multifocal PNA. He was started on abx and BD's. Biktarvy was continued and ID was consulted. 3/18 He had right facial droop, right sided weakness, dysarthria. Neurology was consulted and recommended CTA and MRI. MRI demonstrated acute right medullary infarct. Despite ongoing dysphagia (MBS showed moderately severe pharyngeal phase dysphagia and imaging showed unilateral bulging on right), pt insisted on eating. He reportedly ordered pizza overnight 3/18 and certainly aspirated. He had threatened to leave AMA despite stroke workup ongoing. 3/19, he had hypoxia for which rapid response was called. While being assess, he continued to desaturate down into the 60s and then 40s. He required BVM and was transferred to the ICU for further evaluation and management. Upon arrival in the ICU, she had ongoing desaturations and bradycardia before developing PEA arrest. He had 9 minutes ACLS prior to ROSC including epi x 4 and bicarb x 2. During intubation attempt, she had copious food material and emesis in her oropharynx. Visualization of vocal cords was difficult due to amount of food material and emesis despite suctioning. On 2nd attempt, ETT was passed successfully and saturations improved shortly thereafter. There was grand mal seizure activity noted after intubation that abated after  Midazolam administration.  Pertinent  Medical History:  HIV/AIDS, DM II, seizures, polysubstance use, CVA, cryptococcal disease.  Significant Hospital Events: Including procedures, antibiotic start and stop dates in addition to other pertinent events   3/16 admit 3/18 neuro consult, found to have acute CVA 3/19 Ongoing aspiration despite obvious dysphagia, was planning to leave AMA but sister talked pt into staying. then had a massive aspiration event leading to worse hypoxia and PEA arrest. Txf to [facility] 3/20 PSV cumulative total of 5-6 hrs, following some commands/ purposefully 3/21 self extubated, was reintubated 3/23 Failed SBT due to WOB 3/24 Failed SBT due to apnea 3/25 Tolerated PS >4 hours 3/26: Intubated. 3/31: S/p tracheostomy with ENT. 4/4: trach collar starting 1200  4/5 cont on trach collar  4/14 on deflated trach #8, trach collar 28%. Using the PMV. Planned for PEG tube Wednesday.   Interim History / Subjective:  Awake and alert, on trach collar. Objective:  Blood pressure (!) 102/59, pulse 78, temperature 98.7 F (37.1 C), temperature source Oral, resp. rate (!) 40, height 6' (1.829 Jeremiah), weight 112.7 kg, SpO2 92%.    FiO2 (%):  [28 %] 28 %   Intake/Output Summary (Last 24 hours) at 10/25/2023 1401 Last data filed at 10/25/2023 0500 Gross per 24 hour  Intake --  Output 700 ml  Net -700 ml   Filed Weights   10/23/23 0419 10/24/23 0500 10/25/23 0451  Weight: 112.1 kg 112.7 kg 112.7 kg   Physical Exam: Afebrile, V/S are stable PERL. Oral thrush has resolved  Trach site looks clean Lungs: clear and symmetrical air entry Heart: NL S1/S2, no Jeremiah/g/r Abd: no distension or tenderness LL: trace  edema, symmetrical  CNS: nonfocal. A&Ox4   Labs & images: reviewed.    Assessment/PLAN: Acute hypoxic resp failure due to PNA, improving S/p trach S/p Cefepime for 7 days Xopenex PRN Trach care, wean O2 as tolerated. Assess for OSA before decannulation  Change trach to  #6 cuffless. D/w SP  Acute ischemic stroke and dysphagia -SP eval -TF: NGT -PMV trails -ASA/Plavix/Statin HIV/AIDS: noncompliance -seen by ID -PJP prophylaxis -Crypto Ag is positive under Rx  Tobacco smoking -counseling and needs outpatient PFT -Nicotine patch PRN HTN DM-2 CHF-D (Echo EF 60%, G1DD) PEA arrest (due to hypoxia) Acute metabolic encephalopathy: resolved   Anxiety/depression Seizure disorder Anemia of chronic illness Oral thrush -Nystatin swish  Jeremiah Stevens Jeremiah Wilmore Holsomback, MD [facility] See Amion for pager If no response to pager , please call [phone] until 7pm After 7:00 pm call Elink  [phone] 2:01 PM, 10/25/2023

## 2023-10-25 NOTE — Plan of Care (Signed)
  Problem: Health Behavior/Discharge Planning: Goal: Ability to manage health-related needs will improve Outcome: Progressing   Problem: Clinical Measurements: Goal: Will remain free from infection 10/25/2023 0336 by Rondall Codding, RN Outcome: Progressing 10/25/2023 0336 by Rondall Codding, RN Outcome: Progressing Goal: Respiratory complications will improve 10/25/2023 0336 by Rondall Codding, RN Outcome: Progressing 10/25/2023 0336 by Rondall Codding, RN Outcome: Progressing   Problem: Activity: Goal: Risk for activity intolerance will decrease Outcome: Progressing

## 2023-10-25 NOTE — TOC Progression Note (Signed)
 Transition of Care [facility]) - Progression Note    Patient Details  Name: Maeson Purohit MRN: [id number] Date of Birth: 1975/11/01  [facility]) CM/SW Contact  Jannice Mends, LCSW Phone Number: 10/25/2023, 9:39 AM  Clinical Narrative:    CSW continuing to follow for SNF referrals on 5/1; requires change to uncuffed trach as well prior to placement.   Expected Discharge Plan: Skilled Nursing Facility Barriers to Discharge: English as a second language teacher, SNF Pending bed offer, Continued Medical Work up (new trach)  Expected Discharge Plan and Services In-house Referral: Clinical Social Work   Post Acute Care Choice: Skilled Nursing Facility Living arrangements for the past 2 months: Single Family Home                 DME Arranged: N/A DME Agency: NA                   Social Determinants of Health (SDOH) Interventions SDOH Screenings   Food Insecurity: No Food Insecurity (09/27/2023)  Housing: Low Risk  (09/27/2023)  Transportation Needs: Unmet Transportation Needs (09/27/2023)  Utilities: Not At Risk (09/27/2023)  Depression (PHQ2-9): Low Risk  (05/22/2020)  Tobacco Use: High Risk (10/11/2023)    Readmission Risk Interventions     No data to display

## 2023-10-25 NOTE — Progress Notes (Signed)
 Physical Therapy Treatment Patient Details Name: Jeremiah Stevens MRN: [id number] DOB: [birth year] Today's Date: 10/25/2023   History of Present Illness 48 yo pt admitted to [facility] 09/26/23 for difficulty swallowing. 3/19 PEA, ROSC after 2 min, then bradycardic arrest 1 min later, CPR resumed, pt intubated, and ROSC 6 min later. Seizure-like activity also noted briefly after ROSC. 3/20 pt transferred to [facility] 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 3/21 self extubated and reintubated. 3/31 trach. Tracheostomy changed to #6 cuffless 4/14. PMHx: HIV/AIDS, HTN, herpes, migraine, depression, DM II, polysubstance use, and seizure.    PT Comments  Making excellent progress towards functional goals. Able to transfer with CGA, and ambulate up to 60 feet with CGA using a RW for support. Somewhat erratic with mild instability using RW but self corrects with verbal cues for safety and appropriate use techniques. SpO2 97% on 6L at 28% fiO2 trach collar. Patient will continue to benefit from skilled physical therapy services to further improve independence with functional mobility.     If plan is discharge home, recommend the following: Assistance with cooking/housework;Assist for transportation;Help with stairs or ramp for entrance;A little help with walking and/or transfers;A little help with bathing/dressing/bathroom   Can travel by private vehicle     Yes  Equipment Recommendations  Rolling walker (2 wheels)    Recommendations for Other Services       Precautions / Restrictions Precautions Precautions: Fall Recall of Precautions/Restrictions: Impaired Precaution/Restrictions Comments: trach, cortrak, PMV Restrictions Weight Bearing Restrictions Per Provider Order: No     Mobility  Bed Mobility Overal bed mobility: Needs Assistance Bed Mobility: Supine to Sit     Supine to sit: Supervision     General bed mobility comments: Supervision for safety, no  physical assist needed.    Transfers Overall transfer level: Needs assistance Equipment used: Rolling walker (2 wheels) Transfers: Sit to/from Stand Sit to Stand: Contact guard assist           General transfer comment: CGA to rise from EOB, slightly unsteady but with cues able to grasp RW and stabilize independently.    Ambulation/Gait Ambulation/Gait assistance: Contact guard assist, +2 safety/equipment Gait Distance (Feet): 60 Feet Assistive device: Rolling walker (2 wheels) Gait Pattern/deviations: Trunk flexed, Wide base of support, Decreased step length - right, Decreased stride length, Drifts right/left Gait velocity: decr Gait velocity interpretation: <1.8 ft/sec, indicate of risk for recurrent falls   General Gait Details: Intermittently erratic with RW control. CGA for safety. Slight instability with turns, cues for technique, safety, and proximity to device to stabilize appropriately.   Stairs             Wheelchair Mobility     Tilt Bed    Modified Rankin (Stroke Patients Only) Modified Rankin (Stroke Patients Only) Pre-Morbid Rankin Score: No symptoms Modified Rankin: Moderately severe disability     Balance Overall balance assessment: Needs assistance Sitting-balance support: No upper extremity supported, Feet supported Sitting balance-Leahy Scale: Fair     Standing balance support: Bilateral upper extremity supported, During functional activity, Reliant on assistive device for balance Standing balance-Leahy Scale: Poor Standing balance comment: reliant on RW                            Communication Communication Communication: Impaired Factors Affecting Communication: Trach/intubated;Passey - Muir valve  Cognition Arousal: Alert Behavior During Therapy: WFL for tasks assessed/performed   PT - Cognitive impairments: No apparent impairments  Difficult to assess due to: Tracheostomy                       Following  commands: Intact Following commands impaired: Only follows one step commands consistently    Cueing Cueing Techniques: Verbal cues  Exercises      General Comments General comments (skin integrity, edema, etc.): SpO2 97% on 6L at 28%fio2 trach collar      Pertinent Vitals/Pain Pain Assessment Pain Assessment: No/denies pain    Home Living                          Prior Function            PT Goals (current goals can now be found in the care plan section) Acute Rehab PT Goals Patient Stated Goal: to return home PT Goal Formulation: With patient Time For Goal Achievement: 11/03/23 Potential to Achieve Goals: Good Progress towards PT goals: Progressing toward goals    Frequency    Min 2X/week      PT Plan      Co-evaluation              AM-PAC PT "6 Clicks" Mobility   Outcome Measure  Help needed turning from your back to your side while in a flat bed without using bedrails?: A Little Help needed moving from lying on your back to sitting on the side of a flat bed without using bedrails?: A Little Help needed moving to and from a bed to a chair (including a wheelchair)?: A Little Help needed standing up from a chair using your arms (e.g., wheelchair or bedside chair)?: A Little Help needed to walk in hospital room?: A Little Help needed climbing 3-5 steps with a railing? : A Lot 6 Click Score: 17    End of Session Equipment Utilized During Treatment: Gait belt Activity Tolerance: Patient tolerated treatment well Patient left: in chair;with call bell/phone within reach;with chair alarm set   PT Visit Diagnosis: Unsteadiness on feet (R26.81);Muscle weakness (generalized) (M62.81);Other abnormalities of gait and mobility (R26.89);Difficulty in walking, not elsewhere classified (R26.2);Other symptoms and signs involving the nervous system (R29.898) Hemiplegia - Right/Left: Right Hemiplegia - dominant/non-dominant: Non-dominant Hemiplegia - caused  by: Cerebral infarction     Time: 3086-5784 PT Time Calculation (min) (ACUTE ONLY): 15 min  Charges:    $Gait Training: 8-22 mins PT General Charges $$ ACUTE PT VISIT: 1 Visit                     Jory Ng, PT, DPT [facility]  Rehabilitation Services Physical Therapist Office: [phone] Website: [email or web address]    Alinda Irani 10/25/2023, 3:27 PM

## 2023-10-26 ENCOUNTER — Inpatient Hospital Stay (HOSPITAL_COMMUNITY)

## 2023-10-26 DIAGNOSIS — J189 Pneumonia, unspecified organism: Secondary | ICD-10-CM | POA: Diagnosis not present

## 2023-10-26 LAB — GLUCOSE, CAPILLARY
Glucose-Capillary: 137 mg/dL — ABNORMAL HIGH (ref 70–99)
Glucose-Capillary: 123 mg/dL — ABNORMAL HIGH (ref 70–99)
Glucose-Capillary: 94 mg/dL (ref 70–99)
Glucose-Capillary: 137 mg/dL — ABNORMAL HIGH (ref 70–99)
Glucose-Capillary: 165 mg/dL — ABNORMAL HIGH (ref 70–99)

## 2023-10-26 LAB — BASIC METABOLIC PANEL WITH GFR
Anion gap: 8 (ref 5–15)
BUN: 21 mg/dL — ABNORMAL HIGH (ref 6–20)
CO2: 30 mmol/L (ref 22–32)
Calcium: 9.4 mg/dL (ref 8.9–10.3)
Chloride: 96 mmol/L — ABNORMAL LOW (ref 98–111)
Creatinine, Ser: 1.47 mg/dL — ABNORMAL HIGH (ref 0.61–1.24)
GFR, Estimated: 59 mL/min — ABNORMAL LOW
Glucose, Bld: 146 mg/dL — ABNORMAL HIGH (ref 70–99)
Potassium: 4.4 mmol/L (ref 3.5–5.1)
Sodium: 134 mmol/L — ABNORMAL LOW (ref 135–145)

## 2023-10-26 LAB — CBC WITH DIFFERENTIAL/PLATELET
Abs Immature Granulocytes: 0.03 10*3/uL (ref 0.00–0.07)
Basophils Absolute: 0 10*3/uL (ref 0.0–0.1)
Basophils Relative: 0 %
Eosinophils Absolute: 0.1 10*3/uL (ref 0.0–0.5)
Eosinophils Relative: 3 %
HCT: 31.1 % — ABNORMAL LOW (ref 39.0–52.0)
Hemoglobin: 9.9 g/dL — ABNORMAL LOW (ref 13.0–17.0)
Immature Granulocytes: 1 %
Lymphocytes Relative: 27 %
Lymphs Abs: 1.3 10*3/uL (ref 0.7–4.0)
MCH: 29.4 pg (ref 26.0–34.0)
MCHC: 31.8 g/dL (ref 30.0–36.0)
MCV: 92.3 fL (ref 80.0–100.0)
Monocytes Absolute: 0.7 10*3/uL (ref 0.1–1.0)
Monocytes Relative: 15 %
Neutro Abs: 2.6 10*3/uL (ref 1.7–7.7)
Neutrophils Relative %: 54 %
Platelets: 420 10*3/uL — ABNORMAL HIGH (ref 150–400)
RBC: 3.37 MIL/uL — ABNORMAL LOW (ref 4.22–5.81)
RDW: 15.5 % (ref 11.5–15.5)
WBC: 4.7 10*3/uL (ref 4.0–10.5)
nRBC: 0 % (ref 0.0–0.2)

## 2023-10-26 LAB — OSMOLALITY, URINE: Osmolality, Ur: 458 mosm/kg (ref 300–900)

## 2023-10-26 LAB — OSMOLALITY: Osmolality: 300 mosm/kg — ABNORMAL HIGH (ref 275–295)

## 2023-10-26 LAB — URIC ACID: Uric Acid, Serum: 4.1 mg/dL (ref 3.7–8.6)

## 2023-10-26 LAB — CREATININE, URINE, RANDOM: Creatinine, Urine: 83 mg/dL

## 2023-10-26 LAB — SODIUM, URINE, RANDOM: Sodium, Ur: 68 mmol/L

## 2023-10-26 LAB — MAGNESIUM: Magnesium: 2.4 mg/dL (ref 1.7–2.4)

## 2023-10-26 LAB — PHOSPHORUS: Phosphorus: 2.7 mg/dL (ref 2.5–4.6)

## 2023-10-26 MED ORDER — METOPROLOL TARTRATE 50 MG PO TABS
75.0000 mg | ORAL_TABLET | Freq: Two times a day (BID) | ORAL | Status: DC
  Administered 2023-10-26 – 2023-10-27 (×3): 75 mg
  Filled 2023-10-26 (×3): qty 1

## 2023-10-26 MED ORDER — LACTATED RINGERS IV SOLN
INTRAVENOUS | Status: AC
Start: 2023-10-26 — End: 2023-10-26

## 2023-10-26 MED ORDER — IOHEXOL 300 MG/ML  SOLN: 75.0000 mL | Freq: Once | INTRAMUSCULAR | Status: DC | PRN

## 2023-10-26 MED ORDER — AMLODIPINE BESYLATE 10 MG PO TABS
10.0000 mg | ORAL_TABLET | Freq: Every day | ORAL | Status: DC
  Administered 2023-10-26: 10 mg via ORAL
  Filled 2023-10-26: qty 1

## 2023-10-26 MED ORDER — AMLODIPINE BESYLATE 10 MG PO TABS
10.0000 mg | ORAL_TABLET | Freq: Every day | ORAL | Status: DC
  Administered 2023-10-27: 10 mg
  Filled 2023-10-26: qty 1

## 2023-10-26 MED ORDER — VANCOMYCIN HCL 1500 MG/300ML IV SOLN
1500.0000 mg | INTRAVENOUS | Status: AC
Start: 2023-10-29 — End: 2023-10-29
  Administered 2023-10-29: 1500 mg via INTRAVENOUS
  Filled 2023-10-26: qty 300

## 2023-10-26 NOTE — Plan of Care (Signed)
  Problem: Health Behavior/Discharge Planning: Goal: Ability to identify and utilize available resources and services will improve Outcome: Progressing Goal: Ability to manage health-related needs will improve Outcome: Progressing   Problem: Metabolic: Goal: Ability to maintain appropriate glucose levels will improve Outcome: Progressing   Problem: Nutritional: Goal: Maintenance of adequate nutrition will improve Outcome: Progressing Goal: Progress toward achieving an optimal weight will improve Outcome: Progressing   Problem: Skin Integrity: Goal: Risk for impaired skin integrity will decrease Outcome: Progressing   Problem: Tissue Perfusion: Goal: Adequacy of tissue perfusion will improve Outcome: Progressing   Problem: Education: Goal: Knowledge of General Education information will improve Description: Including pain rating scale, medication(s)/side effects and non-pharmacologic comfort measures Outcome: Progressing   Problem: Health Behavior/Discharge Planning: Goal: Ability to manage health-related needs will improve Outcome: Progressing   Problem: Clinical Measurements: Goal: Ability to maintain clinical measurements within normal limits will improve Outcome: Progressing Goal: Will remain free from infection Outcome: Progressing Goal: Diagnostic test results will improve Outcome: Progressing Goal: Respiratory complications will improve Outcome: Progressing Goal: Cardiovascular complication will be avoided Outcome: Progressing   Problem: Activity: Goal: Risk for activity intolerance will decrease Outcome: Progressing   Problem: Nutrition: Goal: Adequate nutrition will be maintained Outcome: Progressing   Problem: Coping: Goal: Level of anxiety will decrease Outcome: Progressing   Problem: Elimination: Goal: Will not experience complications related to bowel motility Outcome: Progressing Goal: Will not experience complications related to urinary  retention Outcome: Progressing   Problem: Pain Managment: Goal: General experience of comfort will improve and/or be controlled Outcome: Progressing   Problem: Safety: Goal: Ability to remain free from injury will improve Outcome: Progressing   Problem: Skin Integrity: Goal: Risk for impaired skin integrity will decrease Outcome: Progressing   Problem: Activity: Goal: Ability to tolerate increased activity will improve Outcome: Progressing   Problem: Clinical Measurements: Goal: Ability to maintain a body temperature in the normal range will improve Outcome: Progressing   Problem: Respiratory: Goal: Ability to maintain adequate ventilation will improve Outcome: Progressing Goal: Ability to maintain a clear airway will improve Outcome: Progressing   Problem: Education: Goal: Knowledge of disease or condition will improve Outcome: Progressing Goal: Knowledge of secondary prevention will improve (MUST DOCUMENT ALL) Outcome: Progressing Goal: Knowledge of patient specific risk factors will improve (DELETE if not current risk factor) Outcome: Progressing   Problem: Ischemic Stroke/TIA Tissue Perfusion: Goal: Complications of ischemic stroke/TIA will be minimized Outcome: Progressing   Problem: Coping: Goal: Will verbalize positive feelings about self Outcome: Progressing Goal: Will identify appropriate support needs Outcome: Progressing

## 2023-10-26 NOTE — Progress Notes (Signed)
 Occupational Therapy Treatment Patient Details Name: Jeremiah Stevens MRN: [id number] DOB: [birth year] Today's Date: 10/26/2023   History of present illness 48 yo pt admitted to [facility] 09/26/23 for difficulty swallowing. 3/19 PEA, ROSC after 2 min, then bradycardic arrest 1 min later, CPR resumed, pt intubated, and ROSC 6 min later. Seizure-like activity also noted briefly after ROSC. 3/20 pt transferred to [facility]. 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 3/21 self extubated and reintubated. 3/31 trach. Tracheostomy changed to #6 cuffless 4/14. PMHx: HIV/AIDS, HTN, herpes, migraine, depression, DM II, polysubstance use, and seizure.   OT comments  Patient OOB in recliner upon entry. Patient performed grooming, gown change and LB  bathing and dressing seated in recliner with patient able to wash feet and apply lotion with figure 4 and min assist to donn socks. Patient performed static standing to simulate standing grooming tasks with CGA. Patient demonstrated gains with BUE strengthening with patient able to perform BUE HEP with level one therapy band. Patient will benefit from continued inpatient follow up therapy, <3 hours/day. Acute OT to continue to follow to address established goals to facilitate DC to next venue of care.       If plan is discharge home, recommend the following:  A lot of help with walking and/or transfers;A lot of help with bathing/dressing/bathroom;Assistance with cooking/housework;Direct supervision/assist for medications management;Direct supervision/assist for financial management;Assist for transportation;Help with stairs or ramp for entrance   Equipment Recommendations  Wheelchair (measurements OT);Wheelchair cushion (measurements OT);BSC/3in1;Other (comment) (RW)    Recommendations for Other Services      Precautions / Restrictions Precautions Precautions: Fall Recall of Precautions/Restrictions: Impaired Precaution/Restrictions  Comments: trach, cortrak, PMV Restrictions Weight Bearing Restrictions Per Provider Order: No       Mobility Bed Mobility Overal bed mobility: Needs Assistance             General bed mobility comments: OOB in recliner    Transfers Overall transfer level: Needs assistance Equipment used: Rolling walker (2 wheels) Transfers: Sit to/from Stand Sit to Stand: Contact guard assist           General transfer comment: performed static standing from recliner with CGA     Balance Overall balance assessment: Needs assistance Sitting-balance support: No upper extremity supported, Feet supported Sitting balance-Leahy Scale: Fair Sitting balance - Comments: in recliner   Standing balance support: Single extremity supported, Bilateral upper extremity supported, During functional activity Standing balance-Leahy Scale: Poor Standing balance comment: reliant on RW                           ADL either performed or assessed with clinical judgement   ADL Overall ADL's : Needs assistance/impaired Eating/Feeding: NPO   Grooming: Wash/dry hands;Wash/dry face;Brushing hair;Set up;Sitting       Lower Body Bathing: Supervison/ safety;Sitting/lateral leans Lower Body Bathing Details (indicate cue type and reason): able to wash feet and apply lotion with figure 4 Upper Body Dressing : Minimal assistance;Sitting Upper Body Dressing Details (indicate cue type and reason): donning clean gown Lower Body Dressing: Minimal assistance Lower Body Dressing Details (indicate cue type and reason): to change socks                    Extremity/Trunk Assessment              Vision       Perception     Praxis     Communication Communication Communication:  Impaired Factors Affecting Communication: Trach/intubated;Passey - Muir valve   Cognition Arousal: Alert Behavior During Therapy: WFL for tasks assessed/performed Cognition: Difficult to assess     Awareness:  Intellectual awareness intact, Online awareness impaired   Attention impairment (select first level of impairment): Selective attention                     Following commands: Intact Following commands impaired: Only follows one step commands consistently      Cueing   Cueing Techniques: Verbal cues  Exercises Exercises: General Upper Extremity General Exercises - Upper Extremity Shoulder Flexion: Strengthening, Both, 10 reps, Seated, Theraband Theraband Level (Shoulder Flexion): Level 1 (Yellow) Shoulder Horizontal ABduction: Strengthening, Both, 10 reps, Seated, Theraband Theraband Level (Shoulder Horizontal Abduction): Level 1 (Yellow) Elbow Flexion: Strengthening, Both, 10 reps, Seated, Theraband Theraband Level (Elbow Flexion): Level 1 (Yellow) Elbow Extension: Strengthening, Both, 10 reps, Seated, Theraband Theraband Level (Elbow Extension): Level 1 (Yellow)    Shoulder Instructions       General Comments SpO2 96% on 6 liters, 28% fio2 trach collar    Pertinent Vitals/ Pain       Pain Assessment Pain Assessment: Faces Faces Pain Scale: No hurt Pain Intervention(s): Monitored during session  Home Living                                          Prior Functioning/Environment              Frequency  Min 2X/week        Progress Toward Goals  OT Goals(current goals can now be found in the care plan section)  Progress towards OT goals: Progressing toward goals  Acute Rehab OT Goals Patient Stated Goal: to eat again OT Goal Formulation: With patient Time For Goal Achievement: 11/03/23 Potential to Achieve Goals: Good ADL Goals Pt Will Perform Upper Body Dressing: sitting;with min assist Pt Will Perform Lower Body Dressing: sit to/from stand;sitting/lateral leans;with min assist Pt Will Transfer to Toilet: with +2 assist;with mod assist;stand pivot transfer;bedside commode Additional ADL Goal #1: pt will complete HEP R UE min (A)  written  Plan      Co-evaluation                 AM-PAC OT "6 Clicks" Daily Activity     Outcome Measure   Help from another person eating meals?: A Lot Help from another person taking care of personal grooming?: A Little Help from another person toileting, which includes using toliet, bedpan, or urinal?: A Little Help from another person bathing (including washing, rinsing, drying)?: A Lot Help from another person to put on and taking off regular upper body clothing?: A Little Help from another person to put on and taking off regular lower body clothing?: A Lot 6 Click Score: 15    End of Session Equipment Utilized During Treatment: Oxygen (6L 28% FiO2)  OT Visit Diagnosis: Unsteadiness on feet (R26.81);Muscle weakness (generalized) (M62.81) Symptoms and signs involving cognitive functions: Cerebral infarction   Activity Tolerance Patient tolerated treatment well   Patient Left in chair;with call bell/phone within reach;with chair alarm set   Nurse Communication Mobility status        Time: [phone] OT Time Calculation (min): 23 min  Charges: OT General Charges $OT Visit: 1 Visit OT Treatments $Self Care/Home Management : 8-22 mins $Therapeutic Exercise: 8-22 mins  Alfonse Flavors,  [facility] Acute Rehabilitation Services  Office [phone]   Jovita Nipper 10/26/2023, 10:22 AM

## 2023-10-26 NOTE — Progress Notes (Signed)
 Speech Language Pathology Treatment: Jeremiah Stevens Speaking valve  Patient Details Name: Jeremiah Stevens MRN: [id number] DOB: 08-05-1975 Today's Date: 10/26/2023 Time: 1430-1450 SLP Time Calculation (min) (ACUTE ONLY): 20 min  Assessment / Plan / Recommendation Clinical Impression  Patient seen by SLP for skilled treatment focused on voice goals. Patient's trach was downsized to a 6 cuffless on previous date. Patient reported that she was tolerating this but did feel some pain/soreness. Patient achieving strong voice with audible secretions with PMV in place. She had secretions pooling at stoma and no secretions observed from trach itself. Patient performed two sets of 10 reps each of EMST, indicating that it was "moderate" in difficulty and when asked to rate on scale of 1-10 (ten being most difficult), she indicated it was an 8 difficulty. Patient aware of plan for PEG next date. She told SLP that she feels constantly hungry and weak from not being able to eat. She also told SLP that another SLP named "Jeremiah Stevens" (no SLP by that name here) had told her she could have pudding and milkshakes if she wanted. SLP started to explain her NPO status and she then clarified, "not orally, through the tube". SLP explained that only liquids can go through Cortrak feeding tube. SLP encouraged patient continue with EMST and that SLP services will continue to follow her.     HPI HPI: Jeremiah Stevens is a 48 y.o. adult who identifies as male, admitted 09/26/23 with dysphagia x 3 days along with headaches and drooling. She was found to be hypoxic to 80s on room air. CTA showed multifocal PNA.  3/18 she had right facial droop, right sided weakness, dysarthria. MRI demonstrated acute right medullary infarct. Despite ongoing dysphagia (MBS showed moderately severe pharyngeal phase dysphagia and imaging showed unilateral bulging on right), pt insisted on eating. She reportedly ordered pizza overnight 3/18 and certainly  aspirated, desaturated, ultimately suffered PEA arrest, 9 minutes ACLS, copious food material and emesis in oropharynx. Self extubated 3/21, reintubated and extubated 3/26, Trach on 3/31, ATC on 4/4.      SLP Plan  Continue with current plan of care      Recommendations for follow up therapy are one component of a multi-disciplinary discharge planning process, led by the attending physician.  Recommendations may be updated based on patient status, additional functional criteria and insurance authorization.    Recommendations  Diet recommendations: NPO;Other(comment) (ice chips)      Patient may use Passy-Muir Speech Valve: During all waking hours (remove during sleep) PMSV Supervision: Intermittent           Oral care QID;Oral care prior to ice chip/H20   Frequent or constant Supervision/Assistance Aphonia (R49.1)     Continue with current plan of care    Jeremiah Mater, MA, CCC-SLP Speech Therapy

## 2023-10-26 NOTE — Progress Notes (Signed)
 It was found that last dose of Plavix was 4/12, Plavix needs 5 day hold for G tube placement.   The procedure is now scheduled for Friday 4/18.  All ordered been modified.  Patient need contrast via NGT 4/17 8 am and KUB in 4/18 am.  Friday AM Lovenox MAR held.   Please call IR for questions and concerns.   Burnie Therien H Aki Burdin PA-C 10/26/2023 4:03 PM

## 2023-10-26 NOTE — Consult Note (Signed)
 Chief Complaint: Dysphagia - IR consulted for image guided gastric tube placement  Referring Provider(s): Leroy Sea, MD   Supervising Physician: Ruel Favors  Patient Status: [facility] - In-pt  History of Present Illness: Jeremiah Stevens is a 48 y.o. adult with pmhx DM2, seizures, HIV AIDS untreated, cryptococcal disease, obesity, polysubstance abuse, prior CVA. Presenting to IR service for image guided gastric tube placement secondary to dysphagia. Initially was admitted for dysphagia on 09/26/23. Found to also been hypoxic with multifocal pneumonia. 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 Has modified barium swallow on 4/8 which they failed. Hospitalist documented pt has attempted to eat multiple times, and has certainly aspirated. Currently with NG tube, requesting Gtube for better feeding access.   Patient is Full Code  Past Medical History:  Diagnosis Date   Cancer (HCC)    seizures   Diabetes (HCC)    Diabetes mellitus without complication (HCC)    Heartburn    occasional; OTC as needed   Herpes genitalis in men    HIV (human immunodeficiency virus infection) (HCC)    HIV disease (HCC)    HTN (hypertension)    Hyperlipidemia    Hypertension    under control with med., has been on med. x 1 yr.   Lateral malleolar fracture 09/02/2013   left   Migraines    Tear of deltoid ligament of left ankle 09/02/2013   Type 2 diabetes mellitus with hyperosmolar nonketotic hyperglycemia (HCC) 02/10/2020    Past Surgical History:  Procedure Laterality Date   NO PAST SURGERIES     ORIF ANKLE FRACTURE Left 09/13/2013   Procedure: OPEN REDUCTION INTERNAL FIXATION (ORIF) LEFT LATERAL MALLEOLUS ANKLE FRACTURE ;  Surgeon: Dannielle Huh, MD;  Location: [facility];  Service: Orthopedics;  Laterality: Left;   TRACHEOSTOMY TUBE PLACEMENT N/A 10/11/2023   Procedure: CREATION, TRACHEOSTOMY;  Surgeon: Serena Colonel, MD;  Location: [facility] OR;  Service: ENT;  Laterality: N/A;    Allergies: Penicillins, Peanut-containing drug products, and Sustiva [efavirenz]  Medications: Prior to Admission medications   Medication Sig Start Date End Date Taking? Authorizing Provider  azithromycin (ZITHROMAX) 600 MG tablet Take 2 tablets (1,200 mg total) by mouth every Saturday. 09/04/23  Yes Judyann Munson, MD  bictegravir-emtricitabine-tenofovir AF (BIKTARVY) 50-200-25 MG TABS tablet Take 1 tablet by mouth daily. 09/02/23  Yes Judyann Munson, MD  diltiazem (CARDIZEM CD) 360 MG 24 hr capsule Take 1 capsule (360 mg total) by mouth daily. 09/02/23 09/01/24 Yes Pokhrel, Laxman, MD  Magnesium 400 MG TABS Take 400 mg by mouth 2 (two) times daily.   Yes [provider]  polyethylene glycol powder (GLYCOLAX/MIRALAX) 17 GM/SCOOP powder Take 17 g by mouth daily as needed for moderate constipation. 09/02/23  Yes Pokhrel, Laxman, MD  sulfamethoxazole-trimethoprim (BACTRIM DS) 800-160 MG tablet Take 1 tablet by mouth daily. 09/02/23  Yes Judyann Munson, MD  Accu-Chek Softclix Lancets lancets Use as directed 3 (three) times daily to check blood sugar. 09/02/23   Pokhrel, Rebekah Chesterfield, MD  Blood Glucose Monitoring Suppl (BLOOD GLUCOSE MONITOR SYSTEM) w/Device KIT Use as directed 3 (three) times daily. 09/02/23   Pokhrel, Rebekah Chesterfield, MD  Glucose Blood (BLOOD GLUCOSE TEST STRIPS) STRP Use as directed 3 (three) times daily to check blood sugar. 09/02/23   Pokhrel, Rebekah Chesterfield, MD  insulin glargine (LANTUS) 100 UNIT/ML Solostar Pen Inject 12 Units into the skin 2 (two) times daily. 09/02/23 05/29/24  Joycelyn Das, MD  Insulin Pen Needle 32G X 4 MM MISC Use as directed 3 (three) times daily. 09/02/23   Pokhrel, Rebekah Chesterfield, MD  Ipratropium-Albuterol (COMBIVENT RESPIMAT) 20-100 MCG/ACT AERS respimat Inhale 1 puff into the lungs 3 (three) times daily for 7 days, THEN 1 puff every 6 (six) hours as needed for wheezing. 08/04/22 09/10/22  Rodolph Bong, MD   Lancet Device MISC 1 each by Does not apply route 3 (three) times daily. May dispense any manufacturer covered by patient's insurance. 09/02/23   Pokhrel, Rebekah Chesterfield, MD  metoprolol tartrate (LOPRESSOR) 25 MG tablet Take 1 tablet (25 mg total) by mouth 2 (two) times daily. 08/04/22 11/02/22  Rodolph Bong, MD     Family History  Problem Relation Age of Onset   Hypertension Mother     Social History   Socioeconomic History   Marital status: Single    Spouse name: Not on file   Number of children: Not on file   Years of education: Not on file   Highest education level: Not on file  Occupational History   Not on file  Tobacco Use   Smoking status: Every Day    Current packs/day: 0.50    Average packs/day: 0.5 packs/day for 26.3 years (13.1 ttl pk-yrs)    Types: Cigarettes    Start date: 1999   Smokeless tobacco: Never  Vaping Use   Vaping status: Unknown  Substance and Sexual Activity   Alcohol use: Not Currently   Drug use: Not Currently    Frequency: 7.0 times per week    Types: Marijuana    Comment: Daily    Sexual activity: Not Currently    Partners: Male    Comment: Last sexual intercourse 2006  Other Topics Concern   Not on file  Social History Narrative   ** Merged History Encounter **       ** Merged History Encounter **       Social Drivers of Corporate investment banker Strain: Not on file  Food Insecurity: No Food Insecurity (09/27/2023)   Hunger Vital Sign    Worried About Running Out of Food in the Last Year: Never true    Ran Out of Food in the Last Year: Never true  Transportation Needs: Unmet Transportation Needs (09/27/2023)   PRAPARE - Administrator, Civil Service (Medical): Yes    Lack of Transportation (Non-Medical): Yes  Physical Activity: Not on file  Stress: Not on file  Social Connections: Not on file     Review of Systems: A 12 point ROS discussed and pertinent positives are indicated in the HPI above.  All other systems  are negative.  Review of Systems  HENT:  Positive for trouble swallowing.   Respiratory:  Positive for shortness of breath (ongoing since admission).     Vital Signs: BP (!) 134/90   Pulse 89   Temp 99.2 F (37.3 C) (Oral)   Resp (!) 23   Ht 6' (1.829 m)   Wt 244 lb 7.8 oz (110.9 kg)   SpO2 95%   BMI 33.16 kg/m   Advance Care Plan: The advanced care place/surrogate decision maker was discussed at the time of visit and the patient did not wish to discuss or was not able to name a surrogate decision maker or provide an advance care plan.  Physical Exam Vitals and nursing note reviewed.  Constitutional:      Appearance: Normal appearance.  HENT:     Mouth/Throat:  Mouth: Mucous membranes are moist.     Pharynx: Oropharynx is clear.  Cardiovascular:     Rate and Rhythm: Normal rate and regular rhythm.  Pulmonary:     Comments: Course breath sounds bilaterally, appears mildly short of breath Abdominal:     Palpations: Abdomen is soft.     Tenderness: There is no abdominal tenderness.  Musculoskeletal:     Right lower leg: No edema.     Left lower leg: No edema.  Skin:    General: Skin is warm and dry.  Neurological:     General: No focal deficit present.     Mental Status: She is alert and oriented to person, place, and time. Mental status is at baseline.     Imaging: DG Chest Port 1 View Result Date: 10/26/2023 CLINICAL DATA:  Shortness of breath. EXAM: PORTABLE CHEST 1 VIEW COMPARISON:  10/23/2023 FINDINGS: Tracheostomy tube again noted. Low lung volumes without focal consolidation or airspace pulmonary edema. No pleural effusion. Cardiopericardial silhouette is at upper limits of normal for size. A feeding tube passes into the stomach although the distal tip position is not included on the film. Telemetry leads overlie the chest. IMPRESSION: Low lung volumes without acute cardiopulmonary findings. Electronically Signed   By: Donnal Fusi M.D.   On: 10/26/2023 06:36    CT ABDOMEN WO CONTRAST Result Date: 10/24/2023 CLINICAL DATA:  Cerebral infarction and need for percutaneous gastrostomy tube for long-term nutrition. Assessment abdominal and gastric anatomy for potential percutaneous gastrostomy tube placement. EXAM: CT ABDOMEN WITHOUT CONTRAST TECHNIQUE: Multidetector CT imaging of the abdomen was performed following the standard protocol without IV contrast. RADIATION DOSE REDUCTION: This exam was performed according to the departmental dose-optimization program which includes automated exposure control, adjustment of the mA and/or kV according to patient size and/or use of iterative reconstruction technique. COMPARISON:  CTA chest 09/27/2023.  CT abdomen 08/02/2022. FINDINGS: Lower chest: Bibasilar atelectasis, right greater than left. Hepatobiliary: Hepatic steatosis. Normal and contracted gallbladder. No biliary ductal dilatation. Pancreas: Unremarkable. No pancreatic ductal dilatation or surrounding inflammatory changes. Spleen: Normal in size without focal abnormality. Adrenals/Urinary Tract: Normal adrenal glands and kidneys. No evidence of hydronephrosis or renal calculi. Stomach/Bowel: A feeding tube extends through the stomach and terminates in the second portion of the duodenum. No evidence of bowel obstruction, ileus or free intraperitoneal air. Gastric anatomy and positioning is somewhat borderline for percutaneous gastrostomy tube placement based on current CT but based on prior studies, insufflation of the stomach with air should provide a window between the left lobe of the liver and splenic flexure for save gastrostomy tube placement. This can be assessed at the time the procedure. Vascular/Lymphatic: No significant vascular findings are present. No enlarged abdominal or pelvic lymph nodes. Other: No abdominal wall hernia or abnormality. Musculoskeletal: No acute or significant osseous findings. IMPRESSION: 1. Gastric anatomy and positioning is somewhat  borderline for percutaneous gastrostomy tube placement based on current CT but based on prior studies, insufflation of the stomach with air should provide a window between the left lobe of the liver and splenic flexure for gastrostomy tube placement. This can be assessed at the time the procedure. Barium can also be given the night before via the feeding tube in order to opacify the colon at the time possible gastrostomy tube placement. 2. Hepatic steatosis. 3. Bibasilar atelectasis, right greater than left. Electronically Signed   By: Erica Hau M.D.   On: 10/24/2023 11:00   DG Chest Port 1 View Result Date:  10/23/2023 CLINICAL DATA:  Chest pain EXAM: PORTABLE CHEST 1 VIEW COMPARISON:  Four days prior FINDINGS: A tracheostomy tube is well seated. The feeding tube at least reaches the diaphragm. Cardiomegaly. Normal mediastinal contours allowing for rotation. Indistinct density at the bases. No edema, effusion, or pneumothorax. IMPRESSION: Stable low volume chest. Electronically Signed   By: Ronnette Coke M.D.   On: 10/23/2023 07:00   DG Swallowing Func-Speech Pathology Result Date: 10/19/2023 Table formatting from the original result was not included. Modified Barium Swallow Study Study completed by Aurelia Leeks, SLP Student Supervised and reviewed by Noble Bateman MA CCC-SLP Patient Details Name: Jeremiah Stevens MRN: [id number] Date of Birth: 10-28-1975 Today's Date: 10/19/2023 HPI/PMH: HPI: Jeremiah Stevens is a 48 y.o. adult who identifies as male, admitted 09/26/23 with dysphagia x 3 days along with headaches and drooling. She was found to be hypoxic to 80s on room air. CTA showed multifocal PNA.  3/18 she had right facial droop, right sided weakness, dysarthria. MRI demonstrated acute right medullary infarct. Despite ongoing dysphagia (MBS showed moderately severe pharyngeal phase dysphagia and imaging showed unilateral bulging on right), pt insisted on eating. She reportedly ordered pizza  overnight 3/18 and certainly aspirated, desaturated, ultimately suffered PEA arrest, 9 minutes ACLS, copious food material and emesis in oropharynx. Self extubated 3/21, reintubated and extubated 3/26, Trach on 3/31, ATC on 4/4. Clinical Impression: Clinical Impression: Pt presents with a severe oropharyngeal dysphagia (DIGEST Score: 4) primarily characterized by chronic and gross silent aspiration of all consistencies tested due to minimal to no pharyngeal clearance of bolus trials.      Multiple hard cough and throat clears and suctioning were required for clearing bolus samples out of pharynx. Oral transit was mostly brisk with trace residue on tongue. Swallow inititation was primarily at level of pyriforms. Silent aspiration events were a result of minimal transit of bolus through the pharynx and UES. Pharyngeal stripping wave was not present and hyolaryngeal excursion was minimal. Epiglottic inversion was not present, with only minimal movement observed. Passage through UES was very minimal for majority of consistencies but not present during puree. All these components resulted in minimal to no pharyngeal clearance, leading to gross aspiration of consistencies with no immediate cough response from pt. Pt required verbal cueing to perform hard coughs and throat clears; Multiple hard cough and throat clears and suctioning were required for clearing bolus samples out of pharynx. Coughing and throat clearing appeared to not be effective for clearing deep aspiration but only for more penetrated material near laryngeal vestibule opening. Pt required lots of suctioning for puree trial due to no pharygneal clearance of bolus. Right and left head turns, chin tuck, and effortful swallow were utilized but were not effective for improving swallowing efficiency or safety.     Recommend continued NPO status due to severe risk for aspirating large volumes of any bolus consistency. Pt can have ice chips but only after oral  care; pt must use a slow rate and small bites to optimize airway safety. SLP will f/u to monitor pt's tolerance of ice chips and provide exercises for rehabilitating pharyngeal swallow. Factors that may increase risk of adverse event in presence of aspiration [name] & Jessy Morocco 2021): Factors that may increase risk of adverse event in presence of aspiration [name] & Jessy Morocco 2021): Presence of tubes (ETT, trach, NG, etc.); Frequent aspiration of large volumes; Poor general health and/or compromised immunity Recommendations/Plan: Swallowing Evaluation Recommendations Swallowing Evaluation Recommendations Recommendations: NPO; Ice chips PRN after oral  care Medication Administration: Via alternative means Swallowing strategies  : Slow rate; Small bites/sips Postural changes: Position pt fully upright for meals; Stay upright 30-60 min after meals Oral care recommendations: Oral care BID (2x/day) Caregiver Recommendations: Have oral suction available Treatment Plan Treatment Plan Treatment recommendations: Therapy as outlined in treatment plan below Follow-up recommendations: Follow physicians's recommendations for discharge plan and follow up therapies Treatment frequency: Min 2x/week Treatment duration: 2 weeks Recommendations Recommendations for follow up therapy are one component of a multi-disciplinary discharge planning process, led by the attending physician.  Recommendations may be updated based on patient status, additional functional criteria and insurance authorization. Assessment: Orofacial Exam: No data recorded Anatomy: Anatomy: WFL (Trach) Boluses Administered: Boluses Administered Boluses Administered: Thin liquids (Level 0); Mildly thick liquids (Level 2, nectar thick); Moderately thick liquids (Level 3, honey thick)  Oral Impairment Domain: Oral Impairment Domain Lip Closure: No labial escape Tongue control during bolus hold: Posterior escape of less than half of bolus Bolus preparation/mastication: --  (Not tested.) Bolus transport/lingual motion: Brisk tongue motion Oral residue: Trace residue lining oral structures Location of oral residue : Tongue Initiation of pharyngeal swallow : Pyriform sinuses  Pharyngeal Impairment Domain: Pharyngeal Impairment Domain Soft palate elevation: Trace column of contrast or air between SP and PW Laryngeal elevation: Minimal superior movement of thyroid cartilage with minimal approximation of arytenoids to epiglottic petiole Anterior hyoid excursion: Partial anterior movement Epiglottic movement: No inversion Laryngeal vestibule closure: None, wide column air/contrast in laryngeal vestibule Pharyngeal stripping wave : Absent Pharyngeal contraction (A/P view only): N/A Pharyngoesophageal segment opening: No distension with total obstruction of flow Tongue base retraction: Narrow column of contrast or air between tongue base and PPW Pharyngeal residue: Minimal to no pharyngeal clearance Location of pharyngeal residue: Diffuse (>3 areas)  Esophageal Impairment Domain: Esophageal Impairment Domain Esophageal clearance upright position: -- (Not tested.) Pill: No data recorded Penetration/Aspiration Scale Score: Penetration/Aspiration Scale Score 8.  Material enters airway, passes BELOW cords without attempt by patient to eject out (silent aspiration) : Thin liquids (Level 0); Mildly thick liquids (Level 2, nectar thick); Moderately thick liquids (Level 3, honey thick); Puree Compensatory Strategies: Compensatory Strategies Compensatory strategies: Yes Effortful swallow: Ineffective Ineffective Effortful Swallow: Puree Chin tuck: Ineffective Ineffective Chin Tuck: Puree Left head turn: Ineffective Ineffective Left Head Turn: Moderately thick liquid (Level 3, honey thick) Right head turn: Ineffective Ineffective Right Head Turn: Mildly thick liquid (Level 2, nectar thick)   General Information: Caregiver present: No  Diet Prior to this Study: NPO; Cortrak/Small bore NG tube    Temperature : Normal   Respiratory Status: WFL   Supplemental O2: Trach Collar   History of Recent Intubation: Yes  Behavior/Cognition: Alert; Cooperative Self-Feeding Abilities: Able to self-feed Baseline vocal quality/speech: Aphonic Volitional Cough: Able to elicit Volitional Swallow: Able to elicit Exam Limitations: No limitations Goal Planning: Prognosis for improved oropharyngeal function: Guarded Barriers to Reach Goals: Severity of deficits No data recorded No data recorded Consulted and agree with results and recommendations: Patient Pain: Pain Assessment Pain Assessment: No/denies pain End of Session: Start Time:SLP Start Time (ACUTE ONLY): 1335 Stop Time: SLP Stop Time (ACUTE ONLY): 1359 Time Calculation:SLP Time Calculation (min) (ACUTE ONLY): 24 min Charges: No data recorded SLP visit diagnosis: SLP Visit Diagnosis: Dysphagia, oropharyngeal phase (R13.12) Past Medical History: Past Medical History: Diagnosis Date  Cancer (HCC)   seizures  Diabetes (HCC)   Diabetes mellitus without complication (HCC)   Heartburn   occasional; OTC as needed  Herpes genitalis in  men   HIV (human immunodeficiency virus infection) (HCC)   HIV disease (HCC)   HTN (hypertension)   Hyperlipidemia   Hypertension   under control with med., has been on med. x 1 yr.  Lateral malleolar fracture 09/02/2013  left  Migraines   Tear of deltoid ligament of left ankle 09/02/2013  Type 2 diabetes mellitus with hyperosmolar nonketotic hyperglycemia (HCC) 02/10/2020 Past Surgical History: Past Surgical History: Procedure Laterality Date  NO PAST SURGERIES    ORIF ANKLE FRACTURE Left 09/13/2013  Procedure: OPEN REDUCTION INTERNAL FIXATION (ORIF) LEFT LATERAL MALLEOLUS ANKLE FRACTURE ;  Surgeon: Christie Cox, MD;  Location: [facility];  Service: Orthopedics;  Laterality: Left;  TRACHEOSTOMY TUBE PLACEMENT N/A 10/11/2023  Procedure: CREATION, TRACHEOSTOMY;  Surgeon: Janita Mellow, MD;  Location: [facility] OR;  Service: ENT;  Laterality: N/A;  DeBlois, Hardin Leys 10/19/2023, 3:01 PM  DG Chest Port 1 View Result Date: 10/19/2023 CLINICAL DATA:  Shortness of breath EXAM: PORTABLE CHEST 1 VIEW COMPARISON:  Six days ago FINDINGS: Tracheostomy tube which is well seated. An enteric tube at least reaches the diaphragm. Stable borderline heart size. Accentuated vessels in the setting of low lung volumes. Mild streaky density at the lung bases. No effusion or pneumothorax. IMPRESSION: Low volume chest with mild atelectatic type change at the bases. Electronically Signed   By: Ronnette Coke M.D.   On: 10/19/2023 07:52   DG CHEST PORT 1 VIEW Result Date: 10/13/2023 CLINICAL DATA:  Respiratory failure, fever. EXAM: PORTABLE CHEST 1 VIEW COMPARISON:  October 01, 2023. FINDINGS: Stable cardiomediastinal silhouette. Tracheostomy and feeding tubes are in grossly good position. Both lungs are clear. The visualized skeletal structures are unremarkable. IMPRESSION: No active disease. Electronically Signed   By: Rosalene Colon M.D.   On: 10/13/2023 11:59   CT HEAD WO CONTRAST ( ) Result Date: 10/07/2023 CLINICAL DATA:  Stroke follow-up. Increased lethargy. Right hemiplegia. EXAM: CT HEAD WITHOUT CONTRAST TECHNIQUE: Contiguous axial images were obtained from the base of the skull through the vertex without intravenous contrast. RADIATION DOSE REDUCTION: This exam was performed according to the departmental dose-optimization program which includes automated exposure control, adjustment of the mA and/or kV according to patient size and/or use of iterative reconstruction technique. COMPARISON:  Head MRI and CTA 09/29/2023 FINDINGS: Brain: There may be a small hypodense focus in the right medulla in the region of the small acute infarcts shown on recent CT, however streak artifact limits assessment. Artifact also limits assessment of the pons. No acute supratentorial infarct, intracranial hemorrhage, mass, midline shift, or extra-axial fluid collection is identified.  A chronic white matter lacunar infarct is again noted near the genu of the corpus callosum on the left. The ventricles are normal in size. Vascular: No hyperdense vessel. Skull: No acute fracture or suspicious lesion. Sinuses/Orbits: Moderate to prominent right and mild left sphenoid sinus mucosal thickening. Mild posterior right ethmoid air cell opacification. Clear mastoid air cells. Unremarkable orbits. Other: Partially visualized endotracheal and enteric tubes. IMPRESSION: 1. Known recent medullary infarct with poor assessment of the brainstem due to artifact. 2. No acute supratentorial infarct or intracranial hemorrhage. Electronically Signed   By: Aundra Lee M.D.   On: 10/07/2023 15:51   DG Abd 1 View Result Date: 10/03/2023 CLINICAL DATA:  Enteric tube placement EXAM: ABDOMEN - 1 VIEW COMPARISON:  Abdominal radiograph dated 09/29/2023 FINDINGS: Interval replacement of enteric tube with tip projecting over the distal stomach/proximal duodenum. Nonspecific partially imaged gas-filled bowel loops in the upper abdomen. Partially  imaged lower lungs with dense left retrocardiac opacity. IMPRESSION: Interval replacement of enteric tube with tip projecting over the distal stomach/proximal duodenum. Electronically Signed   By: Agustin Cree M.D.   On: 10/03/2023 11:57   DG Chest Port 1 View Result Date: 10/01/2023 CLINICAL DATA:  Intubated EXAM: PORTABLE CHEST 1 VIEW COMPARISON:  09/30/2023, 09/29/2023, 09/26/2023 FINDINGS: Endotracheal tube tip is partially obscured by feeding tube. Tip appears to be about 3.7 cm superior to carina. Enteric tube tip below the diaphragm but incompletely assessed. Cardiomegaly with worsening bibasilar airspace disease. No pleural effusion or pneumothorax. IMPRESSION: 1. Endotracheal tube tip partially obscured by feeding tube. Tip appears to be about 3.7 cm superior to carina. 2. Cardiomegaly with worsening bibasilar airspace disease, this could be due to atelectasis, pneumonia or  aspiration Electronically Signed   By: Jasmine Pang M.D.   On: 10/01/2023 16:20   Overnight EEG with video Result Date: 10/01/2023 Charlsie Quest, MD     10/02/2023  7:17 AM Patient Name: Jeremiah Stevens MRN: [id number] Epilepsy Attending: Charlsie Quest Referring Physician/Provider: Caryl Pina, MD  Duration: 09/30/2023 4098 to 10/01/2023 1191 Patient history: 48yo M s/o cardiac arrest with seizure like activity. EEG to evaluate for seizure Level of alertness:  awake, asleep AEDs during EEG study: Propofol, LEV Technical aspects: This EEG study was done with scalp electrodes positioned according to the 10-20 International system of electrode placement. Electrical activity was reviewed with band pass filter of 1-70Hz , sensitivity of 7 uV/mm, display speed of 41mm/sec with a 60Hz  notched filter applied as appropriate. EEG data were recorded continuously and digitally stored.  Video monitoring was available and reviewed as appropriate. Description: The posterior dominant rhythm consists of 8 Hz activity of moderate voltage (25-35 uV) seen predominantly in posterior head regions, symmetric and reactive to eye opening and eye closing. Sleep was characterized by vertex waves, sleep spindles (12 to 14 Hz), maximal frontocentral region.  EEG showed continuous/intermittent generalized polymorphic sharply contoured 3 to 6 Hz theta-delta slowing. Hyperventilation and photic stimulation were not performed.   ABNORMALITY - Intermittent slow, generalized IMPRESSION: This study is suggestive of mild to moderate diffuse encephalopathy. No seizures or epileptiform discharges were seen throughout the recording. Priyanka Annabelle Harman   Rapid EEG Result Date: 09/30/2023 Charlsie Quest, MD     09/30/2023 11:36 AM Patient Name: Yardley Lekas MRN: [id number] Epilepsy Attending: Charlsie Quest Referring Physician/Provider: Kathlene Cote, PA-C Date: 09/29/2023 Duration: 5 hours 54 mins Patient history: 48yo M s/o cardiac  arrest with seizure like activity. Rapid EEG to evaluate for seizure Level of alertness:  comatose AEDs during EEG study: Propofol LEV Technical aspects: This EEG was obtained using a 10 lead EEG system positioned circumferentially without any parasagittal coverage (rapid EEG). Computer selected EEG is reviewed as  well as background features and all clinically significant events. Description: EEG showed continuous generalized 3 to 6 Hz theta-delta slowing. Hyperventilation and photic stimulation were not performed.   ABNORMALITY - Continuous slow, generalized IMPRESSION: This limited ceribell EEG is suggestive of moderate to severe diffuse encephalopathy. No seizures or epileptiform discharges were seen throughout the recording. Charlsie Quest   Portable Chest xray Result Date: 09/30/2023 CLINICAL DATA:  Respiratory failure. EXAM: PORTABLE CHEST 1 VIEW COMPARISON:  September 29, 2023. FINDINGS: Stable cardiomediastinal silhouette. Endotracheal and nasogastric tubes are unchanged in position. Left lung is clear. Minimal right basilar subsegmental atelectasis or infiltrate is noted. Bony thorax is unremarkable. IMPRESSION: Stable support apparatus. Minimal  right basilar subsegmental atelectasis or infiltrate is noted. Electronically Signed   By: Lupita Raider M.D.   On: 09/30/2023 10:17   DG Abd 1 View Result Date: 09/29/2023 CLINICAL DATA:  Enteric catheter placement EXAM: ABDOMEN - 1 VIEW COMPARISON:  08/02/2022 FINDINGS: Frontal view of the lower chest and upper abdomen demonstrates enteric catheter passing below diaphragm, tip projecting over the gastric antrum. Nonspecific gaseous distention of the large and small bowel. IMPRESSION: 1. Enteric catheter tip projecting over the gastric antrum. Electronically Signed   By: Sharlet Salina M.D.   On: 09/29/2023 17:30   DG Chest 1 View Result Date: 09/29/2023 CLINICAL DATA:  Intubated EXAM: CHEST  1 VIEW COMPARISON:  09/29/2023 FINDINGS: Single frontal view of  the chest demonstrates endotracheal tube overlying tracheal air column, tip 2.2 cm above carina. Enteric catheter passes below diaphragm, tip excluded by collimation. Cardiac silhouette is enlarged but stable. There is increased pulmonary vascular congestion. No effusion or pneumothorax. Chronic elevation the right hemidiaphragm. No acute bony abnormalities. IMPRESSION: 1. Support devices as above. 2. Enlarged cardiac silhouette, with increased pulmonary vascular congestion. Electronically Signed   By: Sharlet Salina M.D.   On: 09/29/2023 17:28   DG Chest 1 View Result Date: 09/29/2023 CLINICAL DATA:  Shortness of breath. EXAM: CHEST  1 VIEW COMPARISON:  Chest radiograph dated 09/26/2023. FINDINGS: Mild cardiomegaly with mild vascular congestion. No focal consolidation, pleural effusion, pneumothorax. No acute osseous pathology. IMPRESSION: Mild cardiomegaly with mild vascular congestion. Electronically Signed   By: Elgie Collard M.D.   On: 09/29/2023 17:26   ECHOCARDIOGRAM COMPLETE Result Date: 09/29/2023    ECHOCARDIOGRAM REPORT   Patient Name:   MONTARIO ZILKA Date of Exam: 09/29/2023 Medical Rec #:  [id number]    Height:       72.0 in Accession #:    [id number]   Weight:       255.3 lb Date of Birth:  21-Aug-1975   BSA:          2.363 m Patient Age:    47 years     BP:           151/98 mmHg Patient Gender: M            HR:           112 bpm. Exam Location:  Inpatient Procedure: 2D Echo, Cardiac Doppler and Color Doppler (Both Spectral and Color            Flow Doppler were utilized during procedure). Indications:    Stroke  History:        Patient has prior history of Echocardiogram examinations. Risk                 Factors:Hypertension and Diabetes.  Sonographer:    Lamont Snowball Referring Phys: [id number] [facility]  Sonographer Comments: Image acquisition challenging due to uncooperative patient. IMPRESSIONS  1. Mild intracavitary gradient. Peak velocity 0.88 m/s. Peak gradient 3.1 mmHg. Left  ventricular ejection fraction, by estimation, is 60 to 65%. The left ventricle has normal function. The left ventricle has no regional wall motion abnormalities. There is moderate concentric left ventricular hypertrophy. Left ventricular diastolic parameters are consistent with Grade I diastolic dysfunction (impaired relaxation).  2. Right ventricular systolic function is normal. The right ventricular size is normal. There is normal pulmonary artery systolic pressure.  3. The mitral valve is normal in structure. Trivial mitral valve regurgitation. No evidence of mitral stenosis.  4. The aortic valve is normal  in structure. Aortic valve regurgitation is not visualized. No aortic stenosis is present.  5. The inferior vena cava is normal in size with greater than 50% respiratory variability, suggesting right atrial pressure of 3 mmHg. FINDINGS  Left Ventricle: Mild intracavitary gradient. Peak velocity 0.88 m/s. Peak gradient 3.1 mmHg. Left ventricular ejection fraction, by estimation, is 60 to 65%. The left ventricle has normal function. The left ventricle has no regional wall motion abnormalities. The left ventricular internal cavity size was normal in size. There is moderate concentric left ventricular hypertrophy. Left ventricular diastolic parameters are consistent with Grade I diastolic dysfunction (impaired relaxation). Indeterminate filling pressures. Right Ventricle: The right ventricular size is normal. No increase in right ventricular wall thickness. Right ventricular systolic function is normal. There is normal pulmonary artery systolic pressure. The tricuspid regurgitant velocity is 1.94 m/s, and  with an assumed right atrial pressure of 3 mmHg, the estimated right ventricular systolic pressure is 18.1 mmHg. Left Atrium: Left atrial size was normal in size. Right Atrium: Right atrial size was normal in size. Pericardium: There is no evidence of pericardial effusion. Mitral Valve: The mitral valve is normal  in structure. Trivial mitral valve regurgitation. No evidence of mitral valve stenosis. MV peak gradient, 6.4 mmHg. The mean mitral valve gradient is 3.0 mmHg. Tricuspid Valve: The tricuspid valve is normal in structure. Tricuspid valve regurgitation is trivial. No evidence of tricuspid stenosis. Aortic Valve: The aortic valve is normal in structure. Aortic valve regurgitation is not visualized. No aortic stenosis is present. Aortic valve peak gradient measures 18.5 mmHg. Pulmonic Valve: The pulmonic valve was normal in structure. Pulmonic valve regurgitation is not visualized. No evidence of pulmonic stenosis. Aorta: The aortic root is normal in size and structure. Venous: The inferior vena cava is normal in size with greater than 50% respiratory variability, suggesting right atrial pressure of 3 mmHg. IAS/Shunts: No atrial level shunt detected by color flow Doppler.  LEFT VENTRICLE PLAX 2D LVIDd:         4.50 cm   Diastology LVIDs:         3.00 cm   LV e' medial:    8.27 cm/s LV PW:         1.50 cm   LV E/e' medial:  12.7 LV IVS:        1.40 cm   LV e' lateral:   7.72 cm/s LVOT diam:     2.20 cm   LV E/e' lateral: 13.6 LV SV:         82 LV SV Index:   35 LVOT Area:     3.80 cm  RIGHT VENTRICLE             IVC RV Basal diam:  4.40 cm     IVC diam: 1.40 cm RV S prime:     25.90 cm/s TAPSE (M-mode): 2.8 cm LEFT ATRIUM             Index        RIGHT ATRIUM           Index LA Vol (A2C):   65.4 ml 27.67 ml/m  RA Area:     18.80 cm LA Vol (A4C):   54.6 ml 23.10 ml/m  RA Volume:   52.90 ml  22.38 ml/m LA Biplane Vol: 60.0 ml 25.39 ml/m  AORTIC VALVE AV Area (Vmax): 2.51 cm AV Vmax:        215.00 cm/s AV Peak Grad:   18.5 mmHg LVOT Vmax:  142.00 cm/s LVOT Vmean:     97.900 cm/s LVOT VTI:       0.217 m  AORTA Ao Root diam: 3.20 cm Ao Asc diam:  3.80 cm MITRAL VALVE                TRICUSPID VALVE MV Area (PHT): 3.55 cm     TR Peak grad:   15.1 mmHg MV Area VTI:   3.27 cm     TR Vmax:        194.00 cm/s MV Peak  grad:  6.4 mmHg MV Mean grad:  3.0 mmHg     SHUNTS MV Vmax:       1.26 m/s     Systemic VTI:  0.22 m MV Vmean:      83.0 cm/s    Systemic Diam: 2.20 cm MV E velocity: 105.00 cm/s MV A velocity: 93.80 cm/s MV E/A ratio:  1.12 Chilton Si MD Electronically signed by Chilton Si MD Signature Date/Time: 09/29/2023/4:21:51 PM    Final    CT ANGIO HEAD NECK W WO CM Result Date: 09/29/2023 CLINICAL DATA:  48 year old male with dysphagia and right lateral medullary infarct on MRI. EXAM: CT ANGIOGRAPHY HEAD AND NECK WITH AND WITHOUT CONTRAST TECHNIQUE: Multidetector CT imaging of the head and neck was performed using the standard protocol during bolus administration of intravenous contrast. Multiplanar CT image reconstructions and MIPs were obtained to evaluate the vascular anatomy. Carotid stenosis measurements (when applicable) are obtained utilizing NASCET criteria, using the distal internal carotid diameter as the denominator. RADIATION DOSE REDUCTION: This exam was performed according to the departmental dose-optimization program which includes automated exposure control, adjustment of the mA and/or kV according to patient size and/or use of iterative reconstruction technique. CONTRAST:  75mL OMNIPAQUE IOHEXOL 350 MG/ML SOLN COMPARISON:  Brain MRI this morning 0115 hours.  Head CT yesterday. FINDINGS: CT HEAD Brain: Intermittent motion artifact. The right medullary infarct is not apparent by CT. No acute intracranial hemorrhage identified. No intracranial mass effect or ventriculomegaly. Calvarium and skull base: Stable. Paranasal sinuses: Visualized paranasal sinuses and mastoids are stable and well aerated. Orbits: No acute orbit or scalp soft tissue finding identified. CTA NECK Skeleton: Carious dentition. No acute osseous abnormality identified. Upper chest: Negative, mild respiratory motion. Other neck: Intermittent motion artifact. Neck soft tissue spaces appear within normal limits. Aortic arch: 3  vessel arch.  Suboptimal aortic arch contrast. Right carotid system: Suboptimal contrast and mild motion artifact but no evidence of plaque or stenosis. Left carotid system: Similar to the opposite side, no evidence of plaque or stenosis. Vertebral arteries: Suboptimal vertebral artery contrast in the neck. The left vertebral artery appears to be dominant. Both distal vertebral arteries are patent. But there is limited detail of the proximal vertebral arteries, especially the non dominant right. CTA HEAD Better although suboptimal intracranial arterial contrast. Posterior circulation: Dominant distal left vertebral artery with bulky calcified plaque remains patent although moderate distal V4 stenosis is possible on series 12, image 168. The non dominant right vertebral artery is patent, but detail of the right vertebrobasilar junction is limited. The basilar artery is tortuous and patent with calcified plaque proximally but no definite basilar stenosis. Basilar tip, SCA and PCA origins appear patent. Posterior communicating arteries are diminutive or absent. Grossly normal bilateral PCA branches. Anterior circulation: Both ICA siphons are patent, although detail is limited. Patent carotid termini, MCA and ACA origins. MCA M1 segments and bifurcations appear patent. ACA A2 segments appear within normal limits. Grossly  normal visible MCA branches. Venous sinuses: Grossly patent. Anatomic variants: Dominant left vertebral artery. Review of the MIP images confirms the above findings IMPRESSION: 1. Suboptimal arterial contrast bolus and intermittent motion artifact. No large vessel occlusion identified. 2. Positive for advanced for age atherosclerosis of the Dominant Left Vertebral Artery V4 segment and proximal Basilar artery. Possible moderate Left V4 stenosis. 3. Limited detail of the non-dominant right vertebral artery, but seems to remain patent to the Basilar. 4. Limited ICA siphon and circle-of-Willis branch  detail. Electronically Signed   By: Odessa Fleming M.D.   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 EXAM: MRI HEAD WITHOUT CONTRAST TECHNIQUE: Multiplanar, multiecho pulse sequences of the brain and surrounding structures were obtained without intravenous contrast. COMPARISON:  Head CT yesterday, and earlier.  Brain MRI 08/27/2023. FINDINGS: Brain: Positive for restricted diffusion in the right lateral medulla near the level of the medullary pyramids series 5, image 8, series 8, image 16. Mild T2 and FLAIR hyperintensity associated with no hemorrhage or mass effect. No other convincing diffusion restriction. But there are small chronic right cerebellar infarcts on the February MRI there. Supratentorial chronic cystic encephalomalacia along the left aspect of the genu of the corpus callosum. And scattered additional periventricular, central and subcortical white matter T2 and FLAIR hyperintensity which was better demonstrated in February. No convincing chronic cerebral blood products on SWI. And deep gray nuclei relatively spared. Vascular: Major intracranial vascular flow voids are stable. Generalized intracranial artery tortuosity, most pronounced in the dominant distal left vertebral artery, basilar. Skull and upper cervical spine: Stable, negative. Sinuses/Orbits: Negative orbits.  Improved paranasal sinus aeration. Other: Mastoids are clear. IMPRESSION: 1. Positive for Acute Right Medullary Infarct. Cytotoxic edema with no hemorrhage or mass effect. 2. Underlying small chronic right cerebellar infarcts, likely also the right PICA territory. And other stable chronic cerebral white matter disease, generalized intracranial artery tortuosity. Electronically Signed   By: Odessa Fleming M.D.   On: 09/29/2023 05:34   DG Swallowing Func-Speech Pathology Result Date: 09/28/2023 Table formatting from the original  result was not included. Images from the original result were not included. Modified Barium Swallow Study Patient Details Name: Jeremiah Stevens MRN: [id number] Date of Birth: [birth year] Today's Date: 09/28/2023 HPI/PMH: HPI: Patient is a 48 y.o. male with PMH: DM-2, seizure disorder, HIV (untreated), obesity, polysubstance abuse, h/o cryptococcal disease. He presented to [facility] ED on 09/26/23 with c/o of inability to swallow for the past three days with HA, hiccups and drooling. He was tachypneic in ED on RA and placed on oxygen via nasal cannula. DG neck soft tissue was negative for retropharyngeal soft tissue swelling or epiglottic enlargement, CXR portable suggestive of small airway infection/inflammation. CT angio/chest/PE showed Bilateral mid and lower lung airspace opacities concerning for multifocal pneumonia, less likely edema.  Pt with dysarthria and reports new SUDDEN onset of dysphagia x1 week ago and cough x4 days.  MBS indicated. Clinical Impression: Clinical Impression: Patient presents with mild oral and moderately severe pharyngeal phase dysphagia.  Patient demonstrates significant difficulty initiating swallow - with liquids filling pyriform sinus prior to swallow initiation - for up to 4 seconds with boluses filling pyrifom sinus.  Pt demonstrates penetration of liquids prior to swallow initiation x1 as barium spills into open airway before the swallow. However mostly aspiration is occuring AFTER the swallow as retention adheres to significant pharyngeal secretionsand spills into airway  post-swallow.  She is clearing approx 75-85% of boluses with swallowing - however post-swallow retention is aspirated.  A multitude of compensation strategies attempted including head turn right *where pt senses retention* and left as well as chin tuck with head turn left did not improve clearance.  Dry swallows helps with clearance but are very difficult for pt to conduct.  PES opening was inadequate allowing  retention in pyriform sinus.   Pt is aspirating secretions chronically.  She reports desire to eat - regardless of dysphagia.  Defer to MD for po option with mitigation strategies.   Pt may aspirate less frequently with full liquid - nectar at this time and allow thin water between meals. Factors that may increase risk of adverse event in presence of aspiration [name] & Jessy Morocco 2021): Factors that may increase risk of adverse event in presence of aspiration [name] & Jessy Morocco 2021): Aspiration of thick, dense, and/or acidic materials (poor secretion management) Recommendations/Plan: Swallowing Evaluation Recommendations Swallowing Evaluation Recommendations Recommendations: -- (defer to MD, but pt wants to eat) Medication Administration: Other (Comment) Treatment Plan Treatment Plan Treatment recommendations: Therapy as outlined in treatment plan below Follow-up recommendations: Follow physicians's recommendations for discharge plan and follow up therapies Functional status assessment: Patient has had a recent decline in their functional status and demonstrates the ability to make significant improvements in function in a reasonable and predictable amount of time. Treatment frequency: Min 2x/week Treatment duration: 1 week Interventions: Aspiration precaution training; Compensatory techniques; Patient/family education; Trials of upgraded texture/liquids Recommendations Recommendations for follow up therapy are one component of a multi-disciplinary discharge planning process, led by the attending physician.  Recommendations may be updated based on patient status, additional functional criteria and insurance authorization. Assessment: Orofacial Exam: Orofacial Exam Oral Cavity: Oral Hygiene: Pooled secretions (needing frequent oral suctioning) Oral Cavity - Dentition: Adequate natural dentition Orofacial Anatomy: WFL Oral Motor/Sensory Function: Suspected cranial nerve impairment CN V - Trigeminal: WFL CN VII -  Facial: WFL CN IX - Glossopharyngeal, CN X - Vagus: -- (posterior tongue appears elevated on right compared to left with mouth opening) CN XII - Hypoglossal: -- (pt is dysarthric, subjectively appears with right sided weakness more than left- with pushing on right) Anatomy: Anatomy: Other (Comment) (? appearance of unilateral bulging on the right -) Boluses Administered: Boluses Administered Boluses Administered: Thin liquids (Level 0); Mildly thick liquids (Level 2, nectar thick); Moderately thick liquids (Level 3, honey thick); Puree; Solid  Oral Impairment Domain: Oral Impairment Domain Lip Closure: No labial escape Tongue control during bolus hold: Posterior escape of less than half of bolus Bolus preparation/mastication: Slow prolonged chewing/mashing with complete recollection Bolus transport/lingual motion: Brisk tongue motion Oral residue: Trace residue lining oral structures Location of oral residue : Tongue Initiation of pharyngeal swallow : Pyriform sinuses  Pharyngeal Impairment Domain: Pharyngeal Impairment Domain Soft palate elevation: No bolus between soft palate (SP)/pharyngeal wall (PW) Laryngeal elevation: Partial superior movement of thyroid cartilage/partial approximation of arytenoids to epiglottic petiole Anterior hyoid excursion: Partial anterior movement Epiglottic movement: Partial inversion Laryngeal vestibule closure: Incomplete, narrow column air/contrast in laryngeal vestibule Pharyngeal stripping wave : Present - diminished Pharyngeal contraction (A/P view only): Unilateral bulging Pharyngoesophageal segment opening: Partial distention/partial duration, partial obstruction of flow Tongue base retraction: Narrow column of contrast or air between tongue base and PPW Pharyngeal residue: Collection of residue within or on pharyngeal structures; Trace residue within or on pharyngeal structures Location of pharyngeal residue: Valleculae; Pyriform sinuses; Aryepiglottic folds; Tongue base   Esophageal Impairment Domain:  No data recorded Pill: Pill Consistency administered: -- (DNT) Penetration/Aspiration Scale Score: Penetration/Aspiration Scale Score 1.  Material does not enter airway: Solid 2.  Material enters airway, remains ABOVE vocal cords then ejected out: Puree 5.  Material enters airway, CONTACTS cords and not ejected out: Moderately thick liquids (Level 3, honey thick) 8.  Material enters airway, passes BELOW cords without attempt by patient to eject out (silent aspiration) : Thin liquids (Level 0); Mildly thick liquids (Level 2, nectar thick) Compensatory Strategies: Compensatory Strategies Compensatory strategies: Yes Effortful swallow: Ineffective Ineffective Effortful Swallow: Mildly thick liquid (Level 2, nectar thick); Thin liquid (Level 0) Multiple swallows: Effective Effective Multiple Swallows: Thin liquid (Level 0); Mildly thick liquid (Level 2, nectar thick); Moderately thick liquid (Level 3, honey thick); Puree; Solid (but difficult for pt to perform) Chin tuck: Ineffective Ineffective Chin Tuck: Thin liquid (Level 0); Mildly thick liquid (Level 2, nectar thick) Left head turn: Ineffective Ineffective Left Head Turn: Mildly thick liquid (Level 2, nectar thick); Thin liquid (Level 0) Right head turn: Ineffective Ineffective Right Head Turn: Thin liquid (Level 0); Mildly thick liquid (Level 2, nectar thick) Posterior head tilt: Ineffective Ineffective Posterior head tilt: Moderately thick liquid (Level 3, honey thick); Mildly thick liquid (Level 2, nectar thick) Chin tuck combined with head turn: Ineffective Ineffective Chin tuck combined with head turn: Mildly thick liquid (Level 2, nectar thick)   General Information: Caregiver present: No  Diet Prior to this Study: NPO   Temperature : Normal   Respiratory Status: WFL (pt coughing on secretions)   Supplemental O2: None (Room air)   History of Recent Intubation: No  Behavior/Cognition: Alert; Cooperative; Pleasant mood Self-Feeding  Abilities: Able to self-feed Baseline vocal quality/speech: Abnormal resonance Volitional Cough: Able to elicit Volitional Swallow: -- (with effort, dry swallows are challenging for pt to conduct) Exam Limitations: No limitations Goal Planning: Prognosis for improved oropharyngeal function: Fair No data recorded No data recorded Patient/Family Stated Goal: pt wants to eat Consulted and agree with results and recommendations: Patient Pain: Pain Assessment Pain Assessment: No/denies pain End of Session: Start Time:SLP Start Time (ACUTE ONLY): 1400 Stop Time: SLP Stop Time (ACUTE ONLY): 1445 Time Calculation:SLP Time Calculation (min) (ACUTE ONLY): 45 min Charges: SLP Evaluations $ SLP Speech Visit: 1 Visit SLP Evaluations $BSS Swallow: 1 Procedure $MBS Swallow: 1 Procedure $Swallowing Treatment: 1 Procedure SLP visit diagnosis: SLP Visit Diagnosis: Dysphagia, pharyngoesophageal phase (R13.14); Dysphagia, oropharyngeal phase (R13.12) Past Medical History: Past Medical History: Diagnosis Date  Cancer (HCC)   seizures  Diabetes (HCC)   Diabetes mellitus without complication (HCC)   Heartburn   occasional; OTC as needed  Herpes genitalis in men   HIV (human immunodeficiency virus infection) (HCC)   HIV disease (HCC)   HTN (hypertension)   Hyperlipidemia   Hypertension   under control with med., has been on med. x 1 yr.  Lateral malleolar fracture 09/02/2013  left  Migraines   Tear of deltoid ligament of left ankle 09/02/2013  Type 2 diabetes mellitus with hyperosmolar nonketotic hyperglycemia (HCC) 02/10/2020 Past Surgical History: Past Surgical History: Procedure Laterality Date  NO PAST SURGERIES    ORIF ANKLE FRACTURE Left 09/13/2013  Procedure: OPEN REDUCTION INTERNAL FIXATION (ORIF) LEFT LATERAL MALLEOLUS ANKLE FRACTURE ;  Surgeon: Dannielle Huh, MD;  Location: [facility];  Service: Orthopedics;  Laterality: Left; A-P view Rolena Infante, MS [facility] SLP Acute Rehab Services Office [phone] Chales Abrahams  09/28/2023, 4:16 PM  CT HEAD WO CONTRAST ( ) Result Date:  09/28/2023 CLINICAL DATA:  48 year old male with increased right side weakness, garbled speech, confusion. Neurologic deficit. Aspiration. EXAM: CT HEAD WITHOUT CONTRAST TECHNIQUE: Contiguous axial images were obtained from the base of the skull through the vertex without intravenous contrast. RADIATION DOSE REDUCTION: This exam was performed according to the departmental dose-optimization program which includes automated exposure control, adjustment of the mA and/or kV according to patient size and/or use of iterative reconstruction technique. COMPARISON:  Brain MRI 08/27/2023.  Head CT 09/25/2023. FINDINGS: Brain: Study is mildly degraded by motion artifact despite repeated imaging attempts. Normal cerebral volume. No midline shift, ventriculomegaly, mass effect, evidence of mass lesion, intracranial hemorrhage or evidence of cortically based acute infarction. Gray-white matter differentiation is within normal limits throughout the brain. Vascular: No suspicious intracranial vascular hyperdensity. Skull: Intact.  No acute osseous abnormality identified. Sinuses/Orbits: Visualized paranasal sinuses and mastoids are stable and well aerated. Other: Chronic Disconjugate gaze. Visualized scalp soft tissues are within normal limits. IMPRESSION: Stable and negative noncontrast CT appearance of the brain when allowing for mildly motion degraded exam. Electronically Signed   By: Odessa Fleming M.D.   On: 09/28/2023 13:58   CT Angio Chest Pulmonary Embolism (PE) W or WO Contrast Result Date: 09/27/2023 CLINICAL DATA:  Shortness of breath, hypoxia EXAM: CT ANGIOGRAPHY CHEST WITH CONTRAST TECHNIQUE: Multidetector CT imaging of the chest was performed using the standard protocol during bolus administration of intravenous contrast. Multiplanar CT image reconstructions and MIPs were obtained to evaluate the vascular anatomy. RADIATION DOSE REDUCTION: This exam was  performed according to the departmental dose-optimization program which includes automated exposure control, adjustment of the mA and/or kV according to patient size and/or use of iterative reconstruction technique. CONTRAST:  OMNIPAQUE IOHEXOL 350 MG/ML SOLN COMPARISON:  08/28/2023 FINDINGS: Cardiovascular: Heart is normal size. Aorta is normal caliber. Scattered coronary artery calcifications. No filling defects in the pulmonary arteries to suggest pulmonary emboli. Mediastinum/Nodes: No mediastinal, hilar, or axillary adenopathy. Trachea and thyroid unremarkable. There is wall thickening involving the distal esophagus suggesting esophagitis. Lungs/Pleura: Again noted is the 1.8 cm left lower lobe subpleural nodule medially on image 99, stable since prior study. Airspace disease noted in both lower lobes, right middle lobe and lingula concerning for pneumonia. No effusions. Upper Abdomen: Hepatic steatosis.  No acute findings. Musculoskeletal: Chest wall soft tissues are unremarkable. No acute bony abnormality. Review of the MIP images confirms the above findings. IMPRESSION: No evidence of pulmonary embolus. Bilateral mid and lower lung airspace opacities concerning for multifocal pneumonia, less likely edema. Hepatic steatosis. Scattered coronary artery disease. Electronically Signed   By: Charlett Nose M.D.   On: 09/27/2023 00:11   DG Chest Portable 1 View Result Date: 09/26/2023 CLINICAL DATA:  Shortness of breath, hypoxia EXAM: PORTABLE CHEST 1 VIEW COMPARISON:  Radiograph 08/25/2023 and CT chest 08/28/2023 FINDINGS: Stable cardiomegaly. Reticulonodular opacities in the lower lungs. Retrocardiac atelectasis or consolidation. No pleural effusion or pneumothorax. No displaced rib fractures. IMPRESSION: Reticulonodular opacities in the lower lungs, suggestive of small airway infection/inflammation. Electronically Signed   By: Minerva Fester M.D.   On: 09/26/2023 21:08   DG Neck Soft Tissue Result  Date: 09/26/2023 CLINICAL DATA:  Dysphagia, drooling, concern for epiglottitis EXAM: NECK SOFT TISSUES - 1+ VIEW COMPARISON:  None Available. FINDINGS: There is no evidence of retropharyngeal soft tissue swelling or epiglottic enlargement. The cervical airway is unremarkable and no radio-opaque foreign body identified. IMPRESSION: Negative. Electronically Signed   By: Minerva Fester M.D.   On: 09/26/2023 21:06  Labs:  CBC: Recent Labs    10/22/23 0633 10/23/23 0645 10/24/23 0810 10/26/23 0317  WBC 5.0 4.2 6.3 4.7  HGB 9.4* 9.5* 10.2* 9.9*  HCT 29.7* 30.0* 31.8* 31.1*  PLT 404* 412* 455* 420*    COAGS: Recent Labs    09/26/23 2101 10/13/23 0025  INR 1.0 1.1  APTT 32  --     BMP: Recent Labs    10/22/23 0633 10/23/23 0645 10/24/23 0810 10/26/23 0317  NA 139 139 137 134*  K 4.5 4.6 4.4 4.4  CL 101 96* 97* 96*  CO2 31 32 33* 30  GLUCOSE 84 108* 69* 146*  BUN 15 15 16  21*  CALCIUM 9.3 9.0 9.4 9.4  CREATININE 1.08 1.01 1.12 1.47*  GFRNONAA >60 >60 >60 59*    LIVER FUNCTION TESTS: Recent Labs    09/27/23 0518 09/28/23 0500 09/29/23 0846 10/16/23 1054  BILITOT 0.5 1.1 0.6 0.4  AST 26 33 18 19  ALT 42 39 31 29  ALKPHOS 76 71 71 78  PROT 9.2* 9.6* 9.1* 7.1  ALBUMIN 3.1* 3.5 3.1* 2.2*    TUMOR MARKERS: No results for input(s): "AFPTM", "CEA", "CA199", "CHROMGRNA" in the last 8760 hours.  Assessment and Plan:  Jeremiah Stevens is a 48 y.o. adult with pmhx DM2, seizures, HIV AIDS untreated, cryptococcal disease, obesity, polysubstance abuse, prior CVA. Presenting to IR service for image guided gastric tube placement secondary to dysphagia. Initially was admitted for dysphagia on 09/26/23. Found to also been hypoxic with multifocal pneumonia. 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 Has modified barium swallow on 4/8 which they failed. Hospitalist documented pt has attempted to eat multiple  times, and has certainly aspirated. Currently with NG tube, requesting Gtube for better feeding access.  CT borderline, will order PO contrast to be given tonight, with abdominal xray tomorrow morning before planned procedure tomorrow.  Risks and benefits discussed with the patient including, but not limited to the need for a barium enema during the procedure, bleeding, infection, peritonitis, or damage to adjacent structures. All of the patient's questions were answered, patient is agreeable to proceed. Consent signed and in chart.   Thank you for allowing our service to participate in Jeremiah Stevens 's care.  Electronically Signed: Nicolasa Barrett, PA-C   10/26/2023, 1:10 PM      I spent a total of 40 Minutes    in face to face in clinical consultation, greater than 50% of which was counseling/coordinating care for G-tube placement

## 2023-10-26 NOTE — Progress Notes (Signed)
 Mobility Specialist Progress Note:    10/26/23 1211  Mobility  Activity Transferred from chair to bed  Level of Assistance Standby assist, set-up cues, supervision of patient - no hands on  Assistive Device None  Distance Ambulated (ft) 5 ft  Activity Response Tolerated well  Mobility Referral Yes  Mobility visit 1 Mobility  Mobility Specialist Start Time (ACUTE ONLY) 1015  Mobility Specialist Stop Time (ACUTE ONLY) 1025  Mobility Specialist Time Calculation (min) (ACUTE ONLY) 10 min   Pt received in chair agreeable to mobility. Attempted to get pt up and ambulating halls but d/t IV team coming into room we had to just transfer back to bed. No physical assistance needed. Bo c/o throughout. Left in bed w/ all needs met. Will attempt to f/u later today.  Inetta Manes Mobility Specialist  Please contact vis Secure Chat or  Rehab Office [phone]

## 2023-10-26 NOTE — Progress Notes (Signed)
 Nutrition Follow-up  DOCUMENTATION CODES:   Obesity unspecified  INTERVENTION:  When appropriate and PEG able to be used, re-initiate feedings at goal rate: Osmolite 1.5 at 58mL/h x 18 hours (1530 ml per day, infuse from 12PM to 6AM) Ensure that tube feed is held 2 hours prior to Tivicay and 1 hour after (from 8:30AM-11:30AM) Prosource TF20 60 ml 1x/d Free water flushes q4 hours  Provides 2375 kcal, 116 gm protein, 1166 ml free water daily (TF + flush = 1766 mL free water)   Continue MVI with minerals daily via tube.  Continue Banatrol TF BID via tube.    NUTRITION DIAGNOSIS:  Increased nutrient needs related to chronic illness as evidenced by estimated needs. - remains applicable  GOAL:  Patient will meet greater than or equal to 90% of their needs - meeting with TF running at goal  MONITOR:  TF tolerance  REASON FOR ASSESSMENT:  Consult Enteral/tube feeding initiation and management  ASSESSMENT:   48 y.o. adult with medical history significant of diabetes type 2, seizure disorder, HIV untreated, obesity, polysubstance abuse .  Patient is admitted for pneumonia with hypoxia in the setting of HIV.   Previous Admission 2/12 Admitted for metabolic encephalopathy 2/14 - Cortrak placed 2/15 - Cortrak removed, Passed swallow eval, carb modified diet  Current Admission 3/16 - Admitted to [facility] for PNA 3/18 - MBS showed chronic aspiration of secretions, no diet formally recommended, MRI showed acute CVA 3/19 - PEA arrest and intubation s/p aspiration event, txr to ICU 3/20 - transferred to [facility] for continuous EEG monitoring, trickles initiated 3/21 - cortrak tube placed, pt self-extubated, emergently reintubated 3/23 - SBT: failed 3/24 - SBT: failed 3/25 - tolerated PS >4 hours 3/26 - intubated  3/29 - pt TF rate increased r/t feeling hungry 3/31 - tracheostomy placed in OR with ENT 4/4 - transitioned to trach collar  4/7 - txr to floor from ICU  4/8 -  MBSS: Failed - NPO 4/9 - trach changed to #8 Shiley cuffed trach 4/14 - trach changed and reduced in size (#6 cuffless) 4/16 - PEG tube placement scheduled  Pt continues to fail swallow studies. Trach transitioned to smaller size during exchange on 4/14. MD requesting speech to see again and assess swallow in light of this change. PEG placement scheduled for tomorrow for longer term nutrition support. She is approaching 30 days with Cortrak in place.  Admit Weight: 115.8kg Current Weight: 110.9kg Lowest Weight: 104.9kg on 3/29   Wt stable. No documented N/V/C/D. No distention noted to abdomen. Bowels stable. Had BM x3 yesterday after 3 days without. Only one documented today. Banatrol BID continues. Appears to be tolerating current regimen well.   Will re-assess tolerance s/p PEG placement. Okay to re-initiate TF at goal rate when PEG approved for use. Recommend transition to bolus feeds at that time to better mimic traditional meal times and prevent feelings of hunger and stabilize blood sugars. No skin breakdown noted.    Drains/Lines: Tracheostomy Cortrak (43 inches)  Noted some adjustments to insulin regimen over last week as patient has experienced some lows. Did receive some lactated ringers today. Sodium marginally low and osmolality marginally high.   Meds: tivicay, fluconazole, SSI 0-20 q4, SSI 8 QID, Semglee 30 BID, MVI, nyastatin, pantoprazole Drips: IV ABX   Labs: Na+ 134 (wdl) K+ 4.4 (wdl) Mg 2.4 (wdl) Osmolality 345>303>305>300 (H) CRP 3.7>3.6>2.3(H) CBGs 69-146 x24 hours A1c 10.8 (09/2023)   Diet Order:   Diet Order  Diet NPO time specified Except for: Ice Chips  Diet effective now            EDUCATION NEEDS:  Education needs have been addressed  Skin:  Skin Assessment: Reviewed RN Assessment  Last BM:  4/15 - type 6 x1  Height:  Ht Readings from Last 1 Encounters:  10/07/23 6' (1.829 m)   Weight:  Wt Readings from Last 1 Encounters:   10/26/23 110.9 kg   Ideal Body Weight:  80.9 kg  BMI:  Body mass index is 33.16 kg/m.  Estimated Nutritional Needs:   Kcal:  2200-2500 kcal/d  Protein:  110-120g  Fluid:  2.4L/day  Con Decant MS, RD, LDN Registered Dietitian Clinical Nutrition RD Inpatient Contact Info in Amion

## 2023-10-26 NOTE — Progress Notes (Signed)
 PROGRESS NOTE   Ziyad Dyar  ZOX:[id number]    DOB: [birth year]    DOA: 09/26/2023  PCP: [name], MD   I have briefly reviewed patients previous medical records in [facility].  Chief Complaint  Patient presents with   Dysphagia    Brief Hospital Course:  48 y.o. adult who with medical history including but not limited to DM2, seizures, HIV AIDS untreated, cryptococcal disease, obesity, polysubstance abuse, prior CVA. She was admitted 09/26/23 with dysphagia x 3 days along with headaches and drooling. He was found to be hypoxic to 80s on room air. CTA was negative for PE but showed multifocal PNA. He was started on abx and BD's. Biktarvy was continued and ID was consulted. 3/18 He had right facial droop, right sided weakness, dysarthria. Neurology was consulted and recommended CTA and MRI. MRI demonstrated acute right medullary infarct. Despite ongoing dysphagia (MBS showed moderately severe pharyngeal phase dysphagia and imaging showed unilateral bulging on right), pt insisted on eating. He reportedly ordered pizza overnight 3/18 and certainly aspirated. He had threatened to leave AMA despite stroke workup ongoing. 3/19, he had hypoxia for which rapid response was called. While being assess, he continued to desaturate down into the 60s and then 40s. He required BVM and was transferred to the ICU for further evaluation and management. Upon arrival in the ICU, she had ongoing desaturations and bradycardia before developing PEA arrest. He had 9 minutes ACLS prior to ROSC including epi x 4 and bicarb x 2. During intubation attempt, she had copious food material and emesis in her oropharynx. Visualization of vocal cords was difficult due to amount of food material and emesis despite suctioning. On 2nd attempt, ETT was passed successfully and saturations improved shortly thereafter. There was grand mal seizure activity noted after intubation that abated after Midazolam  administration.  Significant hospital events:  3/16 admit 3/18 neuro consult, found to have acute CVA 3/19 Ongoing aspiration despite obvious dysphagia, was planning to leave AMA but sister talked pt into staying. then had a massive aspiration event leading to worse hypoxia and PEA arrest. Txf to [facility] 3/20 PSV cumulative total of 5-6 hrs, following some commands/ purposefully 3/21 self extubated, was reintubated 3/23 Failed SBT due to WOB 3/24 Failed SBT due to apnea 3/25 Tolerated PS >4 hours 3/26: Intubated. 3/31: S/p tracheostomy with ENT. 4/4: trach collar starting 1200  4/5 cont on trach collar  4/7: Care transferred from [facility] to [facility] 4/8: Modified barium swallow.  Failed.  NPO.   Assessment & Plan:   Acute hypoxic resp failure - S/p trach - Multifocal Aspiration PNA - Aspiration event- Hx tobacco use - Dysphagia Tolerated trach collar trial off the vent, with mildly increased FiO2 requirement 25-40% on 4/6 . S/P a one-time dose of IV Lasix trial to improve the respiratory status on 4/6.   Currently without tachypnea, saturating in the high 90s on trach collar FiO2 35%, 10 L/min.  Discussed with RN at bedside to attempt to wean. Tracheostomy management per PCCM and ENT Dr. Donalee Fruits.   ENT Dr. Donalee Fruits managing tracheostomy, trach was changed to #6 cuffless on 10/25/2023   -GPC on trach asp 10/12/23, on cefepime #7/7 -SLP following for dysphagia and PMV valve use. -added CPT  -Unfortunately does not have LTAC benefits    Dysphagia.  Still on NG tube feeds.  Speech therapy following unfortunately failed another modified barium swallow on 10/19/2023.  Will monitor for another few days if continues to fail swallow evaluations  will require a PEG tube.  Patient is sitting up in the recliner for several hours now, will try to have speech therapy reevaluate her once she is sitting up in recliner in hopes of her doing slightly better on her swallow eval. Patient now wants a PEG tube, IR consulted  on 10/23/2023, last Plavix dose 10/23/2023.  Plavix to be resumed when cleared by IR post PEG.  Speech therapy is continuing to work with patient in the meantime.  Continue daily aspirin, stroke team was consulted and this has been done after their consent.  Discussed with Dr. Christiane Cowing.  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  Recommending LTAC. -ASA, statin, seen by neurology this admission.   Acute encephalopathy Anxiety, depression  P -minimize CNS depressing meds -delirium precautions  -Encephalopathy has resolved.   PEA arrest  -?Sz like activity following, stable echocardiogram with preserved EF no wall motion abnormality. P -supportive care    HFpEF EF 65% HLD -statin, along with beta-blocker   AKI.  Hydrate, check urine electrolytes, bladder scan to rule out urinary retention and monitor.    Hypretension.  Blood pressure is high.  Medications adjusted, increased beta-blocker, discontinue Cardizem and switch to Norvasc.  As needed hydralazine and monitor.    Normocytic anemia -Follow intermittent CBC.  Hemoglobin stable in the 8-9 g range.   HIV / AIDS Hx +cryptococcal antigen  P -Seen by ID currently on following regimen per ID, post discharge follow-up with ID in the office.  Previously untreated.  Counseled on compliance.  -Continue p.o. fluconazole 400 mg daily on dischare QTc 385 -Continue Descovy/Tivicay  -Continue atovaquone for PJP prophylaxis  DM2, with hyperglycemia, on steroids -cont basal + SSI.  Dosage adjusted on 10/24/2023 as patient was getting transiently hypoglycemic.  CBG (last 3)  Recent Labs    10/25/23 2333 10/26/23 0404 10/26/23 0843  GLUCAP 183* 137* 123*   Lab Results  Component Value Date   HGBA1C 10.8 (H) 09/29/2023    Code status  - Full  Medical noncompliance.   As per chart review and detailed discussion with overnight ICU RN, patient has medical decision  making capacity but is very noncompliant-pulled out feeding tube, attempts to pull out tracheostomy, sickly counseled on being compliant, explained the risk of sudden aspiration, respiratory arrest, death.  Patient swallowed a whole cup of water against advice on 10/24/2023, warned again of consequences being aspiration, pneumonia, sudden death.  She completely understands it and says will not do it again.    Body mass index is 33.16 kg/m.  Obesity.  Follow with PCP.    Nutritional Status Nutrition Problem: Increased nutrient needs Etiology: chronic illness Signs/Symptoms: estimated needs Interventions: Tube feeding, Prostat   DVT prophylaxis: SCDs Start: 09/27/23 0018.  Lovenox   Code Status: Full Code:  Family Communication:   Father Archie Bearded  [phone] on 10/21/23   Sister Korie [phone] on 10/25/2023 at 7:53 AM message left  Disposition:  Status is: Inpatient  Consultants:   ENT Psychiatry PCCM Neurology Infectious disease  Subjective:   Patient in bed, appears comfortable, denies any headache, no fever, no chest pain or pressure, no shortness of breath , no abdominal pain. No new focal weakness. Wants to get PEG tube and NG-tube removed.   Objective:   Vitals:   10/26/23 0405 10/26/23 0500 10/26/23 0523 10/26/23 0900  BP: (!) 139/103  (!) 134/96   Pulse:    92  Resp:  19     Temp: 99.2 F (37.3 C)     TempSrc: Oral     SpO2:    96%  Weight:  110.9 kg    Height:        Awake Alert, No new F.N deficits, NG tube in place, tracheostomy site stable with trach collar [place].AT,PERRAL Supple Neck, No JVD,   Symmetrical Chest wall movement, Good air movement bilaterally, CTAB RRR,No Gallops, Rubs or new Murmurs,  +ve B.Sounds, Abd Soft, No tenderness,   No Cyanosis, Clubbing or edema   Data Reviewed:   I have personally reviewed following labs and imaging studies    Data Review:   Inpatient Medications  Scheduled Meds:  amLODipine  10 mg Oral Daily    aspirin  81 mg Per Tube Daily   atorvastatin  20 mg Per Tube QHS   atovaquone  1,500 mg Per Tube Q lunch   Chlorhexidine Gluconate Cloth  6 each Topical Daily   dolutegravir  50 mg Per Tube Daily   emtricitabine-tenofovir  1 tablet Per Tube Daily   enoxaparin (LOVENOX) injection  50 mg Subcutaneous Q24H   feeding supplement (PROSource TF20)  60 mL Per Tube Daily   fiber supplement (BANATROL TF)  60 mL Per Tube BID   fluconazole  400 mg Per Tube Daily   free water  100 mL Per Tube Q4H   Gerhardt's butt cream   Topical BID   insulin aspart  0-20 Units Subcutaneous Q4H   insulin aspart  8 Units Subcutaneous 4 times per day   insulin glargine-yfgn  30 Units Subcutaneous BID   metoprolol tartrate  75 mg Per Tube BID   multivitamin with minerals  1 tablet Per Tube Daily   nicotine  14 mg Transdermal Daily   nystatin  5 mL Oral QID   mouth rinse  15 mL Mouth Rinse Q2H   pantoprazole (PROTONIX) IV  40 mg Intravenous Q12H   PARoxetine  20 mg Per Tube Daily   QUEtiapine  50 mg Per Tube BID   Continuous Infusions:  dextrose Stopped (10/23/23 0856)   feeding supplement (OSMOLITE 1.5 CAL) 1,000 mL (10/25/23 1227)   lactated ringers 100 mL/hr at 10/26/23 0913   PRN Meds:.acetaminophen **OR** acetaminophen, alum & mag hydroxide-simeth, bisacodyl, dextrose, fentaNYL (SUBLIMAZE) injection, hydrALAZINE, hydrOXYzine, labetalol, levalbuterol, loperamide HCl, melatonin, ondansetron **OR** ondansetron (ZOFRAN) IV, mouth rinse, oxyCODONE, polyethylene glycol  Recent Labs  Lab 10/21/23 0316 10/22/23 0633 10/23/23 0645 10/24/23 0810 10/26/23 0317  WBC 3.7* 5.0 4.2 6.3 4.7  HGB 8.8* 9.4* 9.5* 10.2* 9.9*  HCT 27.8* 29.7* 30.0* 31.8* 31.1*  PLT 392 404* 412* 455* 420*  MCV 92.7 92.5 92.6 91.6 92.3  MCH 29.3 29.3 29.3 29.4 29.4  MCHC 31.7 31.6 31.7 32.1 31.8  RDW 15.2 15.4 15.4 15.5 15.5  LYMPHSABS 0.5* 1.0 0.7 2.8 1.3  MONOABS 0.4 0.6 0.5 0.9 0.7  EOSABS 0.1 0.1 0.1 0.1 0.1  BASOSABS 0.0 0.0 0.0  0.0 0.0    Recent Labs  Lab 10/20/23 0319 10/21/23 0316 10/22/23 0633 10/23/23 0645 10/24/23 0810 10/26/23 0317  NA 136 139 139 139 137 134*  K 4.4 4.4 4.5 4.6 4.4 4.4  CL 98 99 101 96* 97* 96*  CO2 31 30 31  32 33* 30  ANIONGAP 7 10 7 11 7 8   GLUCOSE 135* 91 84 108* 69* 146*  BUN 17 13 15 15 16  21*  CREATININE 0.97 0.92 1.08 1.01 1.12 1.47*  CRP 4.7* 3.7* 3.6*  2.3*  --   --   PROCALCITON <0.10 <0.10 <0.10 <0.10 <0.10  --   MG 2.5* 2.4 2.3 2.4  --  2.4  PHOS 3.5 2.8 3.2 3.1  --  2.7  CALCIUM 8.6* 8.9 9.3 9.0 9.4 9.4      Recent Labs  Lab 10/20/23 0319 10/21/23 0316 10/22/23 0633 10/23/23 0645 10/24/23 0810 10/26/23 0317  CRP 4.7* 3.7* 3.6* 2.3*  --   --   PROCALCITON <0.10 <0.10 <0.10 <0.10 <0.10  --   MG 2.5* 2.4 2.3 2.4  --  2.4  CALCIUM 8.6* 8.9 9.3 9.0 9.4 9.4    --------------------------------------------------------------------------------------------------------------- Lab Results  Component Value Date   CHOL 161 09/29/2023   HDL 28 (L) 09/29/2023   LDLCALC 95 09/29/2023   TRIG 219 (H) 09/30/2023   CHOLHDL 5.8 09/29/2023    Lab Results  Component Value Date   HGBA1C 10.8 (H) 09/29/2023     Micro Results No results found for this or any previous visit (from the past 240 hours).   Radiology Reports  DG Chest Port 1 View Result Date: 10/26/2023 CLINICAL DATA:  Shortness of breath. EXAM: PORTABLE CHEST 1 VIEW COMPARISON:  10/23/2023 FINDINGS: Tracheostomy tube again noted. Low lung volumes without focal consolidation or airspace pulmonary edema. No pleural effusion. Cardiopericardial silhouette is at upper limits of normal for size. A feeding tube passes into the stomach although the distal tip position is not included on the film. Telemetry leads overlie the chest. IMPRESSION: Low lung volumes without acute cardiopulmonary findings. Electronically Signed   By: Donnal Fusi M.D.   On: 10/26/2023 06:36   Signature  -   Lynnwood Sauer M.D on  10/26/2023 at 9:47 AM   -  To page go to [email or web address]

## 2023-10-27 ENCOUNTER — Inpatient Hospital Stay (HOSPITAL_COMMUNITY)

## 2023-10-27 DIAGNOSIS — B20 Human immunodeficiency virus [HIV] disease: Secondary | ICD-10-CM | POA: Diagnosis not present

## 2023-10-27 DIAGNOSIS — J9601 Acute respiratory failure with hypoxia: Secondary | ICD-10-CM | POA: Diagnosis not present

## 2023-10-27 LAB — CBC WITH DIFFERENTIAL/PLATELET
Abs Immature Granulocytes: 0.02 10*3/uL (ref 0.00–0.07)
Basophils Absolute: 0 10*3/uL (ref 0.0–0.1)
Basophils Relative: 1 %
Eosinophils Absolute: 0.2 10*3/uL (ref 0.0–0.5)
Eosinophils Relative: 4 %
HCT: 31.6 % — ABNORMAL LOW (ref 39.0–52.0)
Hemoglobin: 10.1 g/dL — ABNORMAL LOW (ref 13.0–17.0)
Immature Granulocytes: 1 %
Lymphocytes Relative: 24 %
Lymphs Abs: 1.1 10*3/uL (ref 0.7–4.0)
MCH: 29.3 pg (ref 26.0–34.0)
MCHC: 32 g/dL (ref 30.0–36.0)
MCV: 91.6 fL (ref 80.0–100.0)
Monocytes Absolute: 0.7 10*3/uL (ref 0.1–1.0)
Monocytes Relative: 17 %
Neutro Abs: 2.4 10*3/uL (ref 1.7–7.7)
Neutrophils Relative %: 53 %
Platelets: 381 10*3/uL (ref 150–400)
RBC: 3.45 MIL/uL — ABNORMAL LOW (ref 4.22–5.81)
RDW: 15.6 % — ABNORMAL HIGH (ref 11.5–15.5)
WBC: 4.3 10*3/uL (ref 4.0–10.5)
nRBC: 0 % (ref 0.0–0.2)

## 2023-10-27 LAB — BASIC METABOLIC PANEL WITH GFR
Anion gap: 9 (ref 5–15)
BUN: 20 mg/dL (ref 6–20)
CO2: 28 mmol/L (ref 22–32)
Calcium: 9 mg/dL (ref 8.9–10.3)
Chloride: 97 mmol/L — ABNORMAL LOW (ref 98–111)
Creatinine, Ser: 1.33 mg/dL — ABNORMAL HIGH (ref 0.61–1.24)
GFR, Estimated: >60 mL/min (ref >60)
Glucose, Bld: 105 mg/dL — ABNORMAL HIGH (ref 70–99)
Potassium: 4.2 mmol/L (ref 3.5–5.1)
Sodium: 134 mmol/L — ABNORMAL LOW (ref 135–145)

## 2023-10-27 LAB — GLUCOSE, CAPILLARY
Glucose-Capillary: 115 mg/dL — ABNORMAL HIGH (ref 70–99)
Glucose-Capillary: 116 mg/dL — ABNORMAL HIGH (ref 70–99)
Glucose-Capillary: 124 mg/dL — ABNORMAL HIGH (ref 70–99)
Glucose-Capillary: 138 mg/dL — ABNORMAL HIGH (ref 70–99)
Glucose-Capillary: 153 mg/dL — ABNORMAL HIGH (ref 70–99)
Glucose-Capillary: 168 mg/dL — ABNORMAL HIGH (ref 70–99)
Glucose-Capillary: 61 mg/dL — ABNORMAL LOW (ref 70–99)

## 2023-10-27 LAB — PHOSPHORUS: Phosphorus: 3.4 mg/dL (ref 2.5–4.6)

## 2023-10-27 LAB — MAGNESIUM: Magnesium: 2.3 mg/dL (ref 1.7–2.4)

## 2023-10-27 LAB — TROPONIN I (HIGH SENSITIVITY): Troponin I (High Sensitivity): 17 ng/L (ref <18)

## 2023-10-27 MED ORDER — AMLODIPINE BESYLATE 5 MG PO TABS: 5.0000 mg | ORAL_TABLET | Freq: Every day | ORAL | Status: DC

## 2023-10-27 MED ORDER — POLYVINYL ALCOHOL 1.4 % OP SOLN
1.0000 [drp] | OPHTHALMIC | Status: DC | PRN
  Administered 2023-10-27: 1 [drp] via OPHTHALMIC
  Filled 2023-10-27: qty 15

## 2023-10-27 MED ORDER — GUAIFENESIN-DM 100-10 MG/5ML PO SYRP
5.0000 mL | ORAL_SOLUTION | ORAL | Status: DC | PRN
  Administered 2023-10-27 – 2023-10-29 (×6): 5 mL via ORAL
  Filled 2023-10-27 (×6): qty 5

## 2023-10-27 MED ORDER — INSULIN GLARGINE-YFGN 100 UNIT/ML ~~LOC~~ SOLN
15.0000 [IU] | Freq: Two times a day (BID) | SUBCUTANEOUS | Status: DC
  Administered 2023-10-27 – 2023-10-28 (×2): 15 [IU] via SUBCUTANEOUS
  Filled 2023-10-27 (×3): qty 0.15

## 2023-10-27 NOTE — Progress Notes (Signed)
 PROGRESS NOTE   Jeremiah Stevens  UEA:[id number]    DOB: 19-Nov-1975    DOA: 09/26/2023  PCP: [name], MD   I have briefly reviewed patients previous medical records in [facility]  Chief Complaint  Patient presents with   Dysphagia    Brief Hospital Course:   48 y.o. adult who with medical history including but not limited to DM2, seizures, HIV AIDS untreated, cryptococcal disease, obesity, polysubstance abuse, prior CVA. She was admitted 09/26/23 with dysphagia x 3 days along with headaches and drooling. He was found to be hypoxic to 80s on room air. CTA was negative for PE but showed multifocal PNA. He was started on abx and BD's. Biktarvy was continued and ID was consulted. 3/18 He had right facial droop, right sided weakness, dysarthria. Neurology was consulted and recommended CTA and MRI. MRI demonstrated acute right medullary infarct. Despite ongoing dysphagia (MBS showed moderately severe pharyngeal phase dysphagia and imaging showed unilateral bulging on right), pt insisted on eating. He reportedly ordered pizza overnight 3/18 and certainly aspirated. He had threatened to leave AMA despite stroke workup ongoing. 3/19, he had hypoxia for which rapid response was called. While being assess, he continued to desaturate down into the 60s and then 40s. He required BVM and was transferred to the ICU for further evaluation and management. Upon arrival in the ICU, she had ongoing desaturations and bradycardia before developing PEA arrest. He had 9 minutes ACLS prior to ROSC including epi x 4 and bicarb x 2. During intubation attempt, she had copious food material and emesis in her oropharynx. Visualization of vocal cords was difficult due to amount of food material and emesis despite suctioning. On 2nd attempt, ETT was passed successfully and saturations improved shortly thereafter. There was grand mal seizure activity noted after intubation that abated after Midazolam  administration.  Significant hospital events:  3/16 admit 3/18 neuro consult, found to have acute CVA 3/19 Ongoing aspiration despite obvious dysphagia, was planning to leave AMA but sister talked pt into staying. then had a massive aspiration event leading to worse hypoxia and PEA arrest. Txf to [facility] 3/20 PSV cumulative total of 5-6 hrs, following some commands/ purposefully 3/21 self extubated, was reintubated 3/23 Failed SBT due to WOB 3/24 Failed SBT due to apnea 3/25 Tolerated PS >4 hours 3/26: Intubated. 3/31: S/p tracheostomy with ENT. 4/4: trach collar starting 1200  4/5 cont on trach collar  4/7: Care transferred from [facility] to [facility] 4/8: Modified barium swallow.  Failed.  NPO.   Assessment & Plan:   Acute hypoxic resp failure - S/p trach - Multifocal Aspiration PNA - Aspiration event- Hx tobacco use - Dysphagia Tolerated trach collar trial off the vent, with mildly increased FiO2 requirement 25-40% on 4/6 . S/P a one-time dose of IV Lasix trial to improve the respiratory status on 4/6.   Currently without tachypnea, saturating in the high 90s on trach collar FiO2 35%, 10 L/min.  Discussed with RN at bedside to attempt to wean. Tracheostomy management per PCCM and ENT Dr. Donalee Fruits.   ENT Dr. Donalee Fruits managing tracheostomy, trach was changed to #6 cuffless on 10/25/2023  -GPC on trach asp 10/12/23, on cefepime #7/7 -SLP following for dysphagia and PMV valve use. -added CPT  -Unfortunately does not have LTAC benefits    Dysphagia.  - Still NG tube, unfortunately did not do well with speech therapy, so she remains on core track tube feed, plan for PEG on Friday as will need 5 days  of Plavix washout.  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 -ASA, statin, seen by neurology this admission.  Lasix on hold due to above   Acute encephalopathy Anxiety, depression  -minimize CNS depressing meds -delirium precautions   -Encephalopathy has resolved.   PEA arrest  -?Sz like activity following, stable echocardiogram with preserved EF no wall motion abnormality. P -supportive care    HFpEF EF 65% HLD -statin, along with beta-blocker   AKI.  -Continue with IV fluids, avoid blood pressure and dehydration, monitor closely for retention  Hypretension. -  Blood pressure is high.  Medications adjusted, increased beta-blocker. - discontinue Cardizem and switch to Norvasc.  (Will decrease Norvasc to avoid low blood pressure reading) - As needed hydralazine and monitor.    Normocytic anemia -Follow intermittent CBC.  Hemoglobin stable in the 8-9 g range.   HIV / AIDS Hx +cryptococcal antigen  P -Seen by ID currently on following regimen per ID, post discharge follow-up with ID in the office.  Previously untreated.  Counseled on compliance.  -Continue p.o. fluconazole 400 mg daily on dischare QTc 385 -Continue Descovy/Tivicay  -Continue atovaquone for PJP prophylaxis  DM2, with hyperglycemia, on steroids -cont basal + SSI.  Dosage adjusted on 10/24/2023 as patient was getting transiently hypoglycemic. - Patient is with significant hypoglycemic readings she is on significant dose insulin, I will decrease significantly and allow some elevated readings specially she will be n.p.o. in couple days for anticipated PEG.  CBG (last 3)  Recent Labs    10/27/23 0427 10/27/23 0914 10/27/23 1007  GLUCAP 153* 61* 115*   Lab Results  Component Value Date   HGBA1C 10.8 (H) 09/29/2023    Code status  - Full  Medical noncompliance.   As per chart review and detailed discussion with overnight ICU RN, patient has medical decision making capacity but is very noncompliant-pulled out feeding tube, attempts to pull out tracheostomy, sickly counseled on being compliant, explained the risk of sudden aspiration, respiratory arrest, death.  Patient swallowed a whole cup of water against advice on 10/24/2023, warned again  of consequences being aspiration, pneumonia, sudden death.  She completely understands it and says will not do it again.    Body mass index is 32.89 kg/m.  Obesity.  Follow with PCP.    Nutritional Status Nutrition Problem: Increased nutrient needs Etiology: chronic illness Signs/Symptoms: estimated needs Interventions: Tube feeding, Prostat   DVT prophylaxis: SCDs Start: 09/27/23 0018.  Lovenox   Code Status: Full Code:  Family Communication: None at bedside today.  Father Sheralyn Boatman  [phone] on 10/21/23   Berline Lopes [phone] on 10/25/2023 at 7:53 AM message left  Disposition:  Status is: Inpatient  Consultants:   ENT Psychiatry PCCM Neurology Infectious disease  Subjective:  Patient asking when her core track can be discontinued, I told her after PICC is inserted, had low CBG this morning at 61 Objective:   Vitals:   10/27/23 0500 10/27/23 0657 10/27/23 0857 10/27/23 0912  BP:    138/75  Pulse:  83 76 86  Resp:  (!) 23 (!) 29 12  Temp:    98.1 F (36.7 C)  TempSrc:      SpO2:   98% 97%  Weight: 110 kg     Height:        Awake Alert, No new F.N deficits, NG tube in place, tracheostomy site stable with trach collar Symmetrical Chest wall movement, Good air movement bilaterally,  CTAB RRR,No Gallops,Rubs or new Murmurs, No Parasternal Heave +ve B.Sounds, Abd Soft, No tenderness, No rebound - guarding or rigidity. No Cyanosis, Clubbing or edema, No new Rash or bruise    Data Reviewed:   I have personally reviewed following labs and imaging studies    Data Review:   Inpatient Medications  Scheduled Meds:  amLODipine  10 mg Per Tube Daily   aspirin  81 mg Per Tube Daily   atorvastatin  20 mg Per Tube QHS   atovaquone  1,500 mg Per Tube Q lunch   Chlorhexidine Gluconate Cloth  6 each Topical Daily   dolutegravir  50 mg Per Tube Daily   emtricitabine-tenofovir  1 tablet Per Tube Daily   enoxaparin (LOVENOX) injection  50 mg Subcutaneous Q24H    feeding supplement (PROSource TF20)  60 mL Per Tube Daily   fiber supplement (BANATROL TF)  60 mL Per Tube BID   fluconazole  400 mg Per Tube Daily   free water  100 mL Per Tube Q4H   Gerhardt's butt cream   Topical BID   insulin aspart  0-20 Units Subcutaneous Q4H   insulin aspart  8 Units Subcutaneous 4 times per day   insulin glargine-yfgn  15 Units Subcutaneous BID   metoprolol tartrate  75 mg Per Tube BID   multivitamin with minerals  1 tablet Per Tube Daily   nicotine  14 mg Transdermal Daily   nystatin  5 mL Oral QID   pantoprazole (PROTONIX) IV  40 mg Intravenous Q12H   PARoxetine  20 mg Per Tube Daily   QUEtiapine  50 mg Per Tube BID   Continuous Infusions:  dextrose Stopped (10/23/23 0856)   feeding supplement (OSMOLITE 1.5 CAL) Stopped (10/27/23 0615)   [START ON 10/29/2023] vancomycin     PRN Meds:.acetaminophen **OR** acetaminophen, alum & mag hydroxide-simeth, bisacodyl, dextrose, fentaNYL (SUBLIMAZE) injection, hydrALAZINE, hydrOXYzine, [START ON 10/28/2023] iohexol, labetalol, levalbuterol, loperamide HCl, melatonin, ondansetron **OR** ondansetron (ZOFRAN) IV, mouth rinse, oxyCODONE, polyethylene glycol, polyvinyl alcohol  Recent Labs  Lab 10/22/23 0633 10/23/23 0645 10/24/23 0810 10/26/23 0317 10/27/23 0315  WBC 5.0 4.2 6.3 4.7 4.3  HGB 9.4* 9.5* 10.2* 9.9* 10.1*  HCT 29.7* 30.0* 31.8* 31.1* 31.6*  PLT 404* 412* 455* 420* 381  MCV 92.5 92.6 91.6 92.3 91.6  MCH 29.3 29.3 29.4 29.4 29.3  MCHC 31.6 31.7 32.1 31.8 32.0  RDW 15.4 15.4 15.5 15.5 15.6*  LYMPHSABS 1.0 0.7 2.8 1.3 1.1  MONOABS 0.6 0.5 0.9 0.7 0.7  EOSABS 0.1 0.1 0.1 0.1 0.2  BASOSABS 0.0 0.0 0.0 0.0 0.0    Recent Labs  Lab 10/21/23 0316 10/22/23 0633 10/23/23 0645 10/24/23 0810 10/26/23 0317 10/27/23 0315  NA 139 139 139 137 134* 134*  K 4.4 4.5 4.6 4.4 4.4 4.2  CL 99 101 96* 97* 96* 97*  CO2 30 31 32 33* 30 28  ANIONGAP 10 7 11 7 8 9   GLUCOSE 91 84 108* 69* 146* 105*  BUN 13 15 15 16   21* 20  CREATININE 0.92 1.08 1.01 1.12 1.47* 1.33*  CRP 3.7* 3.6* 2.3*  --   --   --   PROCALCITON <0.10 <0.10 <0.10 <0.10  --   --   MG 2.4 2.3 2.4  --  2.4 2.3  PHOS 2.8 3.2 3.1  --  2.7 3.4  CALCIUM 8.9 9.3 9.0 9.4 9.4 9.0      Recent Labs  Lab 10/21/23 0316 10/22/23 0981 10/23/23 0645 10/24/23 0810  10/26/23 0317 10/27/23 0315  CRP 3.7* 3.6* 2.3*  --   --   --   PROCALCITON <0.10 <0.10 <0.10 <0.10  --   --   MG 2.4 2.3 2.4  --  2.4 2.3  CALCIUM 8.9 9.3 9.0 9.4 9.4 9.0    --------------------------------------------------------------------------------------------------------------- Lab Results  Component Value Date   CHOL 161 09/29/2023   HDL 28 (L) 09/29/2023   LDLCALC 95 09/29/2023   TRIG 219 (H) 09/30/2023   CHOLHDL 5.8 09/29/2023    Lab Results  Component Value Date   HGBA1C 10.8 (H) 09/29/2023     Micro Results No results found for this or any previous visit (from the past 240 hours).   Radiology Reports  DG Chest Port 1 View Result Date: 10/26/2023 CLINICAL DATA:  Shortness of breath. EXAM: PORTABLE CHEST 1 VIEW COMPARISON:  10/23/2023 FINDINGS: Tracheostomy tube again noted. Low lung volumes without focal consolidation or airspace pulmonary edema. No pleural effusion. Cardiopericardial silhouette is at upper limits of normal for size. A feeding tube passes into the stomach although the distal tip position is not included on the film. Telemetry leads overlie the chest. IMPRESSION: Low lung volumes without acute cardiopulmonary findings. Electronically Signed   By: Donnal Fusi M.D.   On: 10/26/2023 06:36   Signature  -   Seena Dadds M.D on 10/27/2023 at 10:26 AM   -  To page go to [email or web address]

## 2023-10-27 NOTE — Progress Notes (Addendum)
 Pt. Given 120 mL apple juice. Dr. Osborne Blazer made aware. Per MD, hold Lantus this morning.

## 2023-10-27 NOTE — Progress Notes (Signed)
 Notified Dr. Osborne Blazer that Pt. says she is not full from her tube feeds and asking if she can be given ensure down her tube. Per MD, we cannot give her ensure down her tube at this time, but he will have the nutritionist see Pt. Tomorrow.

## 2023-10-27 NOTE — Progress Notes (Signed)
 Physical Therapy Treatment Patient Details Name: Jeremiah Stevens MRN: [id number] DOB: 04-Aug-1975 Today's Date: 10/27/2023   History of Present Illness 48 yo pt admitted to [facility] 09/26/23 for difficulty swallowing. 3/19 PEA, ROSC after 2 min, then bradycardic arrest 1 min later, CPR resumed, pt intubated, and ROSC 6 min later. Seizure-like activity also noted briefly after ROSC. 3/20 pt transferred to [facility]. 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 3/21 self extubated and reintubated. 3/31 trach. Tracheostomy changed to #6 cuffless 4/14. PMHx: HIV/AIDS, HTN, herpes, migraine, depression, DM II, polysubstance use, and seizure.    PT Comments  Pt greeted seated in recliner chair, pleasant and agreeable to PT session. She performed sit-to-stand using RW with supervision and proper technique. Pt increased her gait distance, ambulating ~237ft using RW with CGA and rehab specialities managing lines/leads. Attempted to wean to 4L on trach collar, but pt desaturated to 84% SpO2. She tolerated the entire session on 6L with SpO2 >92%. Patient will benefit from continued inpatient follow up therapy, <3 hours/day.    If plan is discharge home, recommend the following: Assistance with cooking/housework;Assist for transportation;Help with stairs or ramp for entrance;A little help with walking and/or transfers;A little help with bathing/dressing/bathroom   Can travel by private vehicle     Yes  Equipment Recommendations  Rolling walker (2 wheels)    Recommendations for Other Services       Precautions / Restrictions Precautions Precautions: Fall Recall of Precautions/Restrictions: Intact Precaution/Restrictions Comments: trach, cortrak, PMV Restrictions Weight Bearing Restrictions Per Provider Order: No     Mobility  Bed Mobility               General bed mobility comments: Not assessed. Pt greeted seated in recliner chair and returned there at end of  session.    Transfers Overall transfer level: Needs assistance Equipment used: Rolling walker (2 wheels) Transfers: Sit to/from Stand Sit to Stand: Supervision           General transfer comment: Pt stood from recliner chair, pushing up with BUE support on armrests. No physical assistance or VC for seqeuncing. Good eccentric control with sitting.    Ambulation/Gait Ambulation/Gait assistance: Contact guard assist, +2 safety/equipment Gait Distance (Feet): 200 Feet Assistive device: Rolling walker (2 wheels) Gait Pattern/deviations: Step-through pattern, Decreased stride length, Trunk flexed Gait velocity: decreased Gait velocity interpretation: <1.8 ft/sec, indicate of risk for recurrent falls   General Gait Details: Pt ambulated with a reciprocal gait pattern, even weight shift, and good foot clearence. She maintained body inside RW at all times. Pt demonstrated fwd flex posture VC/TC to correct with mild adjustments noted. She appeared to fixate gaze on floor, VC to scan environment ahead of her.   Stairs             Wheelchair Mobility     Tilt Bed    Modified Rankin (Stroke Patients Only)       Balance Overall balance assessment: Needs assistance Sitting-balance support: No upper extremity supported, Feet supported Sitting balance-Leahy Scale: Fair     Standing balance support: Bilateral upper extremity supported, During functional activity, Reliant on assistive device for balance Standing balance-Leahy Scale: Poor Standing balance comment: Pt dependent on RW for stability during gait and CGA for safety.                            Communication Communication Communication: Impaired Factors Affecting Communication: Trach/intubated;Passey - Muir valve  Cognition Arousal: Alert Behavior During Therapy: WFL for tasks assessed/performed   PT - Cognitive impairments: No apparent impairments                         Following  commands: Intact      Cueing Cueing Techniques: Verbal cues  Exercises      General Comments General comments (skin integrity, edema, etc.): Pt greeted on 6L trach collar. Attempted to titrate to 4L, but pt desaturated to 84% SpO2. Bumped up to 6L and she tolerate mobility with SpO2 >90%.      Pertinent Vitals/Pain Pain Assessment Pain Assessment: Faces Faces Pain Scale: Hurts a little bit Pain Location: Head Pain Descriptors / Indicators: Headache Pain Intervention(s): Monitored during session    Home Living                          Prior Function            PT Goals (current goals can now be found in the care plan section) Acute Rehab PT Goals Patient Stated Goal: Return Home Progress towards PT goals: Progressing toward goals    Frequency    Min 2X/week      PT Plan      Co-evaluation              AM-PAC PT "6 Clicks" Mobility   Outcome Measure  Help needed turning from your back to your side while in a flat bed without using bedrails?: A Little Help needed moving from lying on your back to sitting on the side of a flat bed without using bedrails?: A Little Help needed moving to and from a bed to a chair (including a wheelchair)?: A Little Help needed standing up from a chair using your arms (e.g., wheelchair or bedside chair)?: A Little Help needed to walk in hospital room?: A Little Help needed climbing 3-5 steps with a railing? : A Lot 6 Click Score: 17    End of Session Equipment Utilized During Treatment: Gait belt;Oxygen Activity Tolerance: Patient tolerated treatment well Patient left: in chair;with call bell/phone within reach;with chair alarm set Nurse Communication: Mobility status PT Visit Diagnosis: Unsteadiness on feet (R26.81);Muscle weakness (generalized) (M62.81);Other abnormalities of gait and mobility (R26.89);Difficulty in walking, not elsewhere classified (R26.2);Other symptoms and signs involving the nervous system  (R29.898) Hemiplegia - Right/Left: Right Hemiplegia - dominant/non-dominant: Non-dominant Hemiplegia - caused by: Cerebral infarction     Time: 1610-9604 PT Time Calculation (min) (ACUTE ONLY): 20 min  Charges:    $Gait Training: 8-22 mins PT General Charges $$ ACUTE PT VISIT: 1 Visit                     Glenford Lanes, PT, DPT Acute Rehabilitation Services Office: [phone] Secure Chat Preferred  Riva Chester 10/27/2023, 5:13 PM

## 2023-10-27 NOTE — Plan of Care (Signed)
  Problem: Clinical Measurements: Goal: Ability to maintain clinical measurements within normal limits will improve Outcome: Progressing Goal: Will remain free from infection Outcome: Progressing Goal: Respiratory complications will improve Outcome: Progressing   Problem: Activity: Goal: Risk for activity intolerance will decrease Outcome: Progressing   Problem: Safety: Goal: Ability to remain free from injury will improve Outcome: Progressing   Problem: Education: Goal: Knowledge of disease or condition will improve Outcome: Progressing Goal: Knowledge of patient specific risk factors will improve (DELETE if not current risk factor) Outcome: Progressing

## 2023-10-27 NOTE — Plan of Care (Signed)
  Problem: Clinical Measurements: Goal: Ability to maintain clinical measurements within normal limits will improve Outcome: Progressing Goal: Will remain free from infection Outcome: Progressing Goal: Respiratory complications will improve Outcome: Progressing   Problem: Activity: Goal: Risk for activity intolerance will decrease Outcome: Progressing   Problem: Safety: Goal: Ability to remain free from injury will improve Outcome: Progressing

## 2023-10-27 NOTE — TOC Progression Note (Signed)
 Transition of Care [facility]) - Progression Note    Patient Details  Name: Jeremiah Stevens MRN: [id number] Date of Birth: 1975/10/01  [facility] CM/SW Contact  Jannice Mends, LCSW Phone Number: 10/27/2023, 3:34 PM  Clinical Narrative:    CSW continuing to follow.   Expected Discharge Plan: Skilled Nursing Facility Barriers to Discharge: English as a second language teacher, SNF Pending bed offer, Continued Medical Work up (new trach)  Expected Discharge Plan and Services In-house Referral: Clinical Social Work   Post Acute Care Choice: Skilled Nursing Facility Living arrangements for the past 2 months: Single Family Home                 DME Arranged: N/A DME Agency: NA                   Social Determinants of Health (SDOH) Interventions SDOH Screenings   Food Insecurity: No Food Insecurity (09/27/2023)  Housing: Low Risk  (09/27/2023)  Transportation Needs: Unmet Transportation Needs (09/27/2023)  Utilities: Not At Risk (09/27/2023)  Depression (PHQ2-9): Low Risk  (05/22/2020)  Tobacco Use: High Risk (10/11/2023)    Readmission Risk Interventions     No data to display

## 2023-10-28 ENCOUNTER — Inpatient Hospital Stay (HOSPITAL_COMMUNITY)

## 2023-10-28 DIAGNOSIS — R7989 Other specified abnormal findings of blood chemistry: Secondary | ICD-10-CM | POA: Diagnosis not present

## 2023-10-28 DIAGNOSIS — J9601 Acute respiratory failure with hypoxia: Secondary | ICD-10-CM | POA: Diagnosis not present

## 2023-10-28 DIAGNOSIS — B20 Human immunodeficiency virus [HIV] disease: Secondary | ICD-10-CM | POA: Diagnosis not present

## 2023-10-28 LAB — BASIC METABOLIC PANEL WITH GFR
Anion gap: 10 (ref 5–15)
BUN: 24 mg/dL — ABNORMAL HIGH (ref 6–20)
CO2: 26 mmol/L (ref 22–32)
Calcium: 9.1 mg/dL (ref 8.9–10.3)
Chloride: 97 mmol/L — ABNORMAL LOW (ref 98–111)
Creatinine, Ser: 1.48 mg/dL — ABNORMAL HIGH (ref 0.61–1.24)
GFR, Estimated: 58 mL/min — ABNORMAL LOW (ref >60)
Glucose, Bld: 137 mg/dL — ABNORMAL HIGH (ref 70–99)
Potassium: 4.5 mmol/L (ref 3.5–5.1)
Sodium: 133 mmol/L — ABNORMAL LOW (ref 135–145)

## 2023-10-28 LAB — PHOSPHORUS: Phosphorus: 2.8 mg/dL (ref 2.5–4.6)

## 2023-10-28 LAB — CBC WITH DIFFERENTIAL/PLATELET
Abs Immature Granulocytes: 0.03 10*3/uL (ref 0.00–0.07)
Basophils Absolute: 0 10*3/uL (ref 0.0–0.1)
Basophils Relative: 1 %
Eosinophils Absolute: 0.2 10*3/uL (ref 0.0–0.5)
Eosinophils Relative: 3 %
HCT: 31.8 % — ABNORMAL LOW (ref 39.0–52.0)
Hemoglobin: 10.4 g/dL — ABNORMAL LOW (ref 13.0–17.0)
Immature Granulocytes: 1 %
Lymphocytes Relative: 24 %
Lymphs Abs: 1.3 10*3/uL (ref 0.7–4.0)
MCH: 29.7 pg (ref 26.0–34.0)
MCHC: 32.7 g/dL (ref 30.0–36.0)
MCV: 90.9 fL (ref 80.0–100.0)
Monocytes Absolute: 0.9 10*3/uL (ref 0.1–1.0)
Monocytes Relative: 16 %
Neutro Abs: 3.1 10*3/uL (ref 1.7–7.7)
Neutrophils Relative %: 55 %
Platelets: 375 10*3/uL (ref 150–400)
RBC: 3.5 MIL/uL — ABNORMAL LOW (ref 4.22–5.81)
RDW: 15.8 % — ABNORMAL HIGH (ref 11.5–15.5)
WBC: 5.5 10*3/uL (ref 4.0–10.5)
nRBC: 0 % (ref 0.0–0.2)

## 2023-10-28 LAB — GLUCOSE, CAPILLARY
Glucose-Capillary: 108 mg/dL — ABNORMAL HIGH (ref 70–99)
Glucose-Capillary: 120 mg/dL — ABNORMAL HIGH (ref 70–99)
Glucose-Capillary: 135 mg/dL — ABNORMAL HIGH (ref 70–99)
Glucose-Capillary: 158 mg/dL — ABNORMAL HIGH (ref 70–99)
Glucose-Capillary: 164 mg/dL — ABNORMAL HIGH (ref 70–99)
Glucose-Capillary: 88 mg/dL (ref 70–99)

## 2023-10-28 LAB — D-DIMER, QUANTITATIVE: D-Dimer, Quant: 3.08 ug{FEU}/mL — ABNORMAL HIGH (ref 0.00–0.50)

## 2023-10-28 LAB — MAGNESIUM: Magnesium: 2.4 mg/dL (ref 1.7–2.4)

## 2023-10-28 MED ORDER — SODIUM CHLORIDE 0.9 % IV SOLN: INTRAVENOUS | Status: DC

## 2023-10-28 MED ORDER — TECHNETIUM TO 99M ALBUMIN AGGREGATED
4.2000 | Freq: Once | INTRAVENOUS | Status: AC | PRN
  Administered 2023-10-28: 4.2 via INTRAVENOUS

## 2023-10-28 MED ORDER — METOPROLOL TARTRATE 50 MG PO TABS
50.0000 mg | ORAL_TABLET | Freq: Two times a day (BID) | ORAL | Status: DC
  Administered 2023-10-28 (×2): 50 mg
  Filled 2023-10-28 (×2): qty 1

## 2023-10-28 MED ORDER — DEXTROSE 5 % IV SOLN: INTRAVENOUS | Status: AC

## 2023-10-28 MED ORDER — SODIUM CHLORIDE 0.9 % IV SOLN: INTRAVENOUS | Status: AC

## 2023-10-28 MED ORDER — INSULIN GLARGINE-YFGN 100 UNIT/ML ~~LOC~~ SOLN
15.0000 [IU] | Freq: Two times a day (BID) | SUBCUTANEOUS | Status: DC
  Administered 2023-10-30 – 2023-11-10 (×22): 15 [IU] via SUBCUTANEOUS
  Filled 2023-10-28 (×25): qty 0.15

## 2023-10-28 NOTE — Progress Notes (Signed)
 Speech Language Pathology Treatment: Dysphagia  Patient Details Name: Jeremiah Stevens MRN: [id number] DOB: 04-Aug-1975 Today's Date: 10/28/2023 Time: 1040-1110 SLP Time Calculation (min) (ACUTE ONLY): 30 min  Assessment / Plan / Recommendation Clinical Impression  Great session with Jeremiah Stevens today. She demonstrates some small subjective improvements with swallowing since trach change. Today able to increase EMST to 25 cm H20, Jeremiah Stevens completed 30 reps with moderate effort. Wet vocal quality still appreciated at baseline, but observed her to swallow and cough more to clear these. Speech is certainly much clearer since trach change. SLP observed pt with a small sip of water; swallow under palpation seemed more significant than in prior sessions. Offered Jeremiah Stevens a mango icee with cues for very small tastes with a spoon and cues for multiple hard swallows. Encouraged Jeremiah Stevens to swallow saliva, ice and and icee with effort, as many swallows as possible. This will be the best method to regain increase ROM with swallow mechanism. Coughing still occurs quite often, but cough is strong. Hopeful that we are still encouraging swallowing while keeping risk of pneumonia low.   Addressed Jeremiah Stevens questions about her feeding tube. She tells me that she is not sleeping well, is waking up with headaches and she attributes this to hunger. That is why she is asking about putting things in her NG/PEG tube. RN tells me that dietitian has been consulted about feeding amount. Suspect this is also psychological, Jeremiah Stevens misses eating and doesn't feel satisfied without oral intake. Perhaps addressing sleep quality and pain could also increase her comfort. Assured pt that I am seeing signs of improvement and after NG tube is out we will do another swallow test to see if there have been some gains. PEG is tomorrow (4/18). Jeremiah Stevens can have an icee on Saturday and one on Sunday. SLP will plan for MBS Monday if schedule allows.    HPI HPI: Jeremiah Stevens is a 48 y.o. adult who identifies as male, admitted 09/26/23 with dysphagia x 3 days along with headaches and drooling. She was found to be hypoxic to 80s on room air. CTA showed multifocal PNA.  3/18 she had right facial droop, right sided weakness, dysarthria. MRI demonstrated acute right medullary infarct. Despite ongoing dysphagia (MBS showed moderately severe pharyngeal phase dysphagia and imaging showed unilateral bulging on right), pt insisted on eating. She reportedly ordered pizza overnight 3/18 and certainly aspirated, desaturated, ultimately suffered PEA arrest, 9 minutes ACLS, copious food material and emesis in oropharynx. Self extubated 3/21, reintubated and extubated 3/26, Trach on 3/31, ATC on 4/4.      SLP Plan  Continue with current plan of care      Recommendations for follow up therapy are one component of a multi-disciplinary discharge planning process, led by the attending physician.  Recommendations may be updated based on patient status, additional functional criteria and insurance authorization.    Recommendations  Diet recommendations: Other(comment) (ice, one icee a day) Liquids provided via: Teaspoon                  Oral care QID;Oral care prior to ice chip/H20   Frequent or constant Supervision/Assistance       Continue with current plan of care     Jeremiah Stevens, Jeremiah Stevens  10/28/2023, 11:27 AM

## 2023-10-28 NOTE — Progress Notes (Signed)
 Patient received with complain of mid chest pain after having continuous coughing.Made MD [facility] aware. Placed the order for EKG, X ray and lab and PRN cough syrup. Around 0030, complain of left upper chest pain, gave PRN tylenol @0045  with pain relief. But @0400 , pt wake up with similar lt upper chest pain. Made MD aware, placed the order for NM pulmonary perf and vent and hold any PRN for now.  Will continue to monitor pt

## 2023-10-28 NOTE — Progress Notes (Signed)
 PROGRESS NOTE   Jeremiah Stevens  WUJ:[id number]    DOB: 08/21/1975    DOA: 09/26/2023  PCP: [name], MD   I have briefly reviewed patients previous medical records in [facility].  Chief Complaint  Patient presents with   Dysphagia    Brief Hospital Course:   48 y.o. adult who with medical history including but not limited to DM2, seizures, HIV AIDS untreated, cryptococcal disease, obesity, polysubstance abuse, prior CVA. She was admitted 09/26/23 with dysphagia x 3 days along with headaches and drooling. He was found to be hypoxic to 80s on room air. CTA was negative for PE but showed multifocal PNA. He was started on abx and BD's. Biktarvy was continued and ID was consulted. 3/18 He had right facial droop, right sided weakness, dysarthria. Neurology was consulted and recommended CTA and MRI. MRI demonstrated acute right medullary infarct. Despite ongoing dysphagia (MBS showed moderately severe pharyngeal phase dysphagia and imaging showed unilateral bulging on right), pt insisted on eating. He reportedly ordered pizza overnight 3/18 and certainly aspirated. He had threatened to leave AMA despite stroke workup ongoing. 3/19, he had hypoxia for which rapid response was called. While being assess, he continued to desaturate down into the 60s and then 40s. He required BVM and was transferred to the ICU for further evaluation and management. Upon arrival in the ICU, she had ongoing desaturations and bradycardia before developing PEA arrest. He had 9 minutes ACLS prior to ROSC including epi x 4 and bicarb x 2. During intubation attempt, she had copious food material and emesis in her oropharynx. Visualization of vocal cords was difficult due to amount of food material and emesis despite suctioning. On 2nd attempt, ETT was passed successfully and saturations improved shortly thereafter. There was grand mal seizure activity noted after intubation that abated after Midazolam  administration.  Significant hospital events:  3/16 admit 3/18 neuro consult, found to have acute CVA 3/19 Ongoing aspiration despite obvious dysphagia, was planning to leave AMA but sister talked pt into staying. then had a massive aspiration event leading to worse hypoxia and PEA arrest. Txf to [facility] 3/20 PSV cumulative total of 5-6 hrs, following some commands/ purposefully 3/21 self extubated, was reintubated 3/23 Failed SBT due to WOB 3/24 Failed SBT due to apnea 3/25 Tolerated PS >4 hours 3/26: Intubated. 3/31: S/p tracheostomy with ENT. 4/4: trach collar starting 1200  4/5 cont on trach collar  4/7: Care transferred from [facility] to [facility] 4/8: Modified barium swallow.  Failed.  NPO.   Assessment & Plan:   Acute hypoxic resp failure - S/p trach - Multifocal Aspiration PNA - Aspiration event- Hx tobacco use - Patient failed multiple events of extubation and SBT, eventually had tracheostomy done by ENT 3/31. - Currently weaned off the vent, remains trach dependent. -Tracheostomy management per PCCM and ENT Dr. Donalee Fruits.   ENT Dr. Donalee Fruits managing tracheostomy, trach was changed to #6 cuffless on 10/25/2023  -GPC on trach asp 10/12/23, on cefepime #7/7 -SLP following for dysphagia and PMV valve use. -added CPT  -Unfortunately does not have LTAC benefits -He remains with  significant secretions, followed closely by SLP and RT    Dysphagia.  - He remains  with core Trak tube, unfortunately did not do well with SLP, will need PEG placement . -  IR consult appreciated, plan for PEG placement tomorrow after holding Plavix x 5 days  AKI -Creatinine back up today to 1.47, avoid hypotension, so I will discontinue Norvasc and decrease  beta-blockers to 50 cc twice daily to avoid hypotension, continue with IV fluids specially patient will be n.p.o. after midnight for PEG in a.m..  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 -ASA,  statin, seen by neurology this admission.  Lasix on hold due to above  Chest pain Elevated D-dimers -Patient with intermittent chest pain overnight , EKG is nonacute, troponins negative x 2, but D-dimers are elevated, so VQ scan is pending (holding on CTA PE protocol given AKI), will obtain venous Doppler as well given elevated D-dimers.    Acute encephalopathy Anxiety, depression  -minimize CNS depressing meds -delirium precautions  -Encephalopathy has resolved.   PEA arrest  -?Sz like activity following, stable echocardiogram with preserved EF no wall motion abnormality. P -supportive care    HFpEF EF 65% HLD -statin, along with beta-blocker   Hypretension. -  Blood pressure is high.  Medications adjusted, increased beta-blocker. - discontinue Cardizem and switch to Norvasc.  (Will decrease Norvasc to avoid low blood pressure reading) - As needed hydralazine and monitor.    Normocytic anemia -Follow intermittent CBC.  Hemoglobin stable in the 8-9 g range.   HIV / AIDS Hx +cryptococcal antigen  P -Seen by ID currently on following regimen per ID, post discharge follow-up with ID in the office.  Previously untreated.  Counseled on compliance.  -Continue p.o. fluconazole 400 mg daily on dischare QTc 385 -Continue Descovy/Tivicay  -Continue atovaquone for PJP prophylaxis  DM2, with hyperglycemia, on steroids -cont basal + SSI.  Dosage adjusted on 10/24/2023 as patient was getting transiently hypoglycemic. - Patient is with significant hypoglycemic readings she is on significant dose insulin, I will decrease significantly and allow some elevated readings specially she will be n.p.o. in couple days for anticipated PEG. - Will hold Lantus this evening and tomorrow morning as will be n.p.o. for PEG tomorrow)  CBG (last 3)  Recent Labs    10/28/23 0433 10/28/23 0829 10/28/23 1327  GLUCAP 135* 120* 88   Lab Results  Component Value Date   HGBA1C 10.8 (H) 09/29/2023     Code status  - Full  Medical noncompliance.   As per chart review and detailed discussion with overnight ICU RN, patient has medical decision making capacity but is very noncompliant-pulled out feeding tube, attempts to pull out tracheostomy, sickly counseled on being compliant, explained the risk of sudden aspiration, respiratory arrest, death.  Patient swallowed a whole cup of water against advice on 10/24/2023, warned again of consequences being aspiration, pneumonia, sudden death.  She completely understands it and says will not do it again.    Body mass index is 33.43 kg/m.  Obesity.  Follow with PCP.    Nutritional Status Nutrition Problem: Increased nutrient needs Etiology: chronic illness Signs/Symptoms: estimated needs Interventions: Tube feeding, Prostat   DVT prophylaxis: SCDs Start: 09/27/23 0018.  Lovenox   Code Status: Full Code:  Family Communication: None at bedside today.  Father Archie Bearded  [phone] on 10/21/23   Sister Korie [phone] on 10/25/2023 at 7:53 AM message left  Disposition:  Status is: Inpatient  Consultants:   ENT Psychiatry PCCM Neurology Infectious disease  Subjective:  Patient asking when her core track can be discontinued, I told her after PICC is inserted, had low CBG this morning at 61 Objective:   Vitals:   10/28/23 0819 10/28/23 0825 10/28/23 1154 10/28/23 1258  BP:  129/70  103/63  Pulse: 80  81 76  Resp: [name])  25 (!) 23 (!) 25 20  Temp:  98.1 F (36.7 C)    TempSrc:  Oral    SpO2: 100%  93% 93%  Weight:      Height:        Awake Alert, No new F.N deficits, NG tube in place, tracheostomy site stable with trach collar, some secretions present. Symmetrical Chest wall movement, Good air movement bilaterally, CTAB RRR,No Gallops,Rubs or new Murmurs, No Parasternal Heave +ve B.Sounds, Abd Soft, No tenderness, No rebound - guarding or rigidity. No Cyanosis, Clubbing or edema, No new Rash or bruise    Data Reviewed:   I  have personally reviewed following labs and imaging studies    Data Review:   Inpatient Medications  Scheduled Meds:  aspirin  81 mg Per Tube Daily   atorvastatin  20 mg Per Tube QHS   atovaquone  1,500 mg Per Tube Q lunch   Chlorhexidine Gluconate Cloth  6 each Topical Daily   dolutegravir  50 mg Per Tube Daily   emtricitabine-tenofovir  1 tablet Per Tube Daily   enoxaparin (LOVENOX) injection  50 mg Subcutaneous Q24H   feeding supplement (PROSource TF20)  60 mL Per Tube Daily   fiber supplement (BANATROL TF)  60 mL Per Tube BID   fluconazole  400 mg Per Tube Daily   free water  100 mL Per Tube Q4H   Gerhardt's butt cream   Topical BID   insulin aspart  0-20 Units Subcutaneous Q4H   insulin aspart  8 Units Subcutaneous 4 times per day   [START ON 10/29/2023] insulin glargine-yfgn  15 Units Subcutaneous BID   metoprolol tartrate  50 mg Per Tube BID   multivitamin with minerals  1 tablet Per Tube Daily   nicotine  14 mg Transdermal Daily   nystatin  5 mL Oral QID   pantoprazole (PROTONIX) IV  40 mg Intravenous Q12H   PARoxetine  20 mg Per Tube Daily   QUEtiapine  50 mg Per Tube BID   Continuous Infusions:  sodium chloride 100 mL/hr at 10/28/23 1007   dextrose Stopped (10/23/23 0856)   feeding supplement (OSMOLITE 1.5 CAL) 1,000 mL (10/28/23 1329)   [START ON 10/29/2023] vancomycin     PRN Meds:.acetaminophen **OR** acetaminophen, alum & mag hydroxide-simeth, bisacodyl, dextrose, fentaNYL (SUBLIMAZE) injection, guaiFENesin-dextromethorphan, hydrALAZINE, hydrOXYzine, iohexol, labetalol, levalbuterol, loperamide HCl, melatonin, ondansetron **OR** ondansetron (ZOFRAN) IV, mouth rinse, oxyCODONE, polyethylene glycol, polyvinyl alcohol  Recent Labs  Lab 10/23/23 0645 10/24/23 0810 10/26/23 0317 10/27/23 0315 10/28/23 0318  WBC 4.2 6.3 4.7 4.3 5.5  HGB 9.5* 10.2* 9.9* 10.1* 10.4*  HCT 30.0* 31.8* 31.1* 31.6* 31.8*  PLT 412* 455* 420* 381 375  MCV 92.6 91.6 92.3 91.6 90.9  MCH  29.3 29.4 29.4 29.3 29.7  MCHC 31.7 32.1 31.8 32.0 32.7  RDW 15.4 15.5 15.5 15.6* 15.8*  LYMPHSABS 0.7 2.8 1.3 1.1 1.3  MONOABS 0.5 0.9 0.7 0.7 0.9  EOSABS 0.1 0.1 0.1 0.2 0.2  BASOSABS 0.0 0.0 0.0 0.0 0.0    Recent Labs  Lab 10/22/23 0633 10/23/23 0645 10/24/23 0810 10/26/23 0317 10/27/23 0315 10/28/23 0318  NA 139 139 137 134* 134* 133*  K 4.5 4.6 4.4 4.4 4.2 4.5  CL 101 96* 97* 96* 97* 97*  CO2 31 32 33* 30 28 26   ANIONGAP 7 11 7 8 9 10   GLUCOSE 84 108* 69* 146* 105* 137*  BUN 15 15 16  21* 20 24*  CREATININE 1.08 1.01 1.12 1.47* 1.33* 1.48*  CRP 3.6* 2.3*  --   --   --   --   DDIMER  --   --   --   --   --  3.08*  PROCALCITON <0.10 <0.10 <0.10  --   --   --   MG 2.3 2.4  --  2.4 2.3 2.4  PHOS 3.2 3.1  --  2.7 3.4 2.8  CALCIUM 9.3 9.0 9.4 9.4 9.0 9.1      Recent Labs  Lab 10/22/23 0633 10/23/23 0645 10/24/23 0810 10/26/23 0317 10/27/23 0315 10/28/23 0318  CRP 3.6* 2.3*  --   --   --   --   DDIMER  --   --   --   --   --  3.08*  PROCALCITON <0.10 <0.10 <0.10  --   --   --   MG 2.3 2.4  --  2.4 2.3 2.4  CALCIUM 9.3 9.0 9.4 9.4 9.0 9.1    --------------------------------------------------------------------------------------------------------------- Lab Results  Component Value Date   CHOL 161 09/29/2023   HDL 28 (L) 09/29/2023   LDLCALC 95 09/29/2023   TRIG 219 (H) 09/30/2023   CHOLHDL 5.8 09/29/2023    Lab Results  Component Value Date   HGBA1C 10.8 (H) 09/29/2023     Micro Results No results found for this or any previous visit (from the past 240 hours).   Radiology Reports  DG Chest Port 1 View Result Date: 10/27/2023 CLINICAL DATA:  Chest pain EXAM: PORTABLE CHEST 1 VIEW COMPARISON:  10/26/2023 FINDINGS: Tracheostomy is unchanged. Feeding tube in place, seen entering the stomach. The tip is not visualized on this chest x-ray. Heart and mediastinal contours are within normal limits. No focal opacities or effusions. No acute bony abnormality.  IMPRESSION: No active cardiopulmonary disease. Electronically Signed   By: Janeece Mechanic M.D.   On: 10/27/2023 22:12   Signature  -   Kelleen Patee Ayaana Biondo M.D on 10/28/2023 at 2:45 PM   -  To page go to [email or web address]

## 2023-10-28 NOTE — Plan of Care (Signed)
  Problem: Skin Integrity: Goal: Risk for impaired skin integrity will decrease Outcome: Progressing   Problem: Clinical Measurements: Goal: Ability to maintain clinical measurements within normal limits will improve Outcome: Progressing Goal: Will remain free from infection Outcome: Progressing Goal: Respiratory complications will improve Outcome: Progressing   Problem: Pain Managment: Goal: General experience of comfort will improve and/or be controlled Outcome: Progressing

## 2023-10-28 NOTE — Progress Notes (Signed)
 BLE venous duplex has been completed.   Results can be found under chart review under CV PROC. 10/28/2023 8:19 PM Avyn Coate RVT, RDMS

## 2023-10-28 NOTE — TOC Progression Note (Signed)
 Transition of Care [facility]) - Progression Note    Patient Details  Name: Gio Janoski MRN: [id number] Date of Birth: [birth year]  [facility] of [facility] CM/SW Contact  Jannice Mends, LCSW Phone Number: 10/28/2023, 8:52 AM  Clinical Narrative:    CSW continuing to follow.   Expected Discharge Plan: Skilled Nursing Facility Barriers to Discharge: English as a second language teacher, SNF Pending bed offer, Continued Medical Work up (new trach)  Expected Discharge Plan and Services In-house Referral: Clinical Social Work   Post Acute Care Choice: Skilled Nursing Facility Living arrangements for the past 2 months: Single Family Home                 DME Arranged: N/A DME Agency: NA                   Social Determinants of Health (SDOH) Interventions SDOH Screenings   Food Insecurity: No Food Insecurity (09/27/2023)  Housing: Low Risk  (09/27/2023)  Transportation Needs: Unmet Transportation Needs (09/27/2023)  Utilities: Not At Risk (09/27/2023)  Depression (PHQ2-9): Low Risk  (05/22/2020)  Tobacco Use: High Risk (10/11/2023)    Readmission Risk Interventions     No data to display

## 2023-10-29 ENCOUNTER — Inpatient Hospital Stay (HOSPITAL_COMMUNITY)

## 2023-10-29 DIAGNOSIS — J189 Pneumonia, unspecified organism: Secondary | ICD-10-CM | POA: Diagnosis not present

## 2023-10-29 HISTORY — PX: IR GASTROSTOMY TUBE MOD SED: IMG625

## 2023-10-29 LAB — CBC WITH DIFFERENTIAL/PLATELET
Abs Immature Granulocytes: 0.02 10*3/uL (ref 0.00–0.07)
Basophils Absolute: 0 10*3/uL (ref 0.0–0.1)
Basophils Relative: 0 %
Eosinophils Absolute: 0.1 10*3/uL (ref 0.0–0.5)
Eosinophils Relative: 2 %
HCT: 30.2 % — ABNORMAL LOW (ref 39.0–52.0)
Hemoglobin: 9.8 g/dL — ABNORMAL LOW (ref 13.0–17.0)
Immature Granulocytes: 0 %
Lymphocytes Relative: 26 %
Lymphs Abs: 1.4 10*3/uL (ref 0.7–4.0)
MCH: 29.4 pg (ref 26.0–34.0)
MCHC: 32.5 g/dL (ref 30.0–36.0)
MCV: 90.7 fL (ref 80.0–100.0)
Monocytes Absolute: 0.8 10*3/uL (ref 0.1–1.0)
Monocytes Relative: 15 %
Neutro Abs: 3 10*3/uL (ref 1.7–7.7)
Neutrophils Relative %: 57 %
Platelets: 322 10*3/uL (ref 150–400)
RBC: 3.33 MIL/uL — ABNORMAL LOW (ref 4.22–5.81)
RDW: 15.7 % — ABNORMAL HIGH (ref 11.5–15.5)
WBC: 5.4 10*3/uL (ref 4.0–10.5)
nRBC: 0.6 % — ABNORMAL HIGH (ref 0.0–0.2)

## 2023-10-29 LAB — BASIC METABOLIC PANEL WITH GFR
Anion gap: 7 (ref 5–15)
BUN: 21 mg/dL — ABNORMAL HIGH (ref 6–20)
CO2: 26 mmol/L (ref 22–32)
Calcium: 9 mg/dL (ref 8.9–10.3)
Chloride: 101 mmol/L (ref 98–111)
Creatinine, Ser: 1.46 mg/dL — ABNORMAL HIGH (ref 0.61–1.24)
GFR, Estimated: 59 mL/min — ABNORMAL LOW (ref >60)
Glucose, Bld: 96 mg/dL (ref 70–99)
Potassium: 4.4 mmol/L (ref 3.5–5.1)
Sodium: 134 mmol/L — ABNORMAL LOW (ref 135–145)

## 2023-10-29 LAB — PROTIME-INR
INR: 1.1 (ref 0.8–1.2)
Prothrombin Time: 14.2 s (ref 11.4–15.2)

## 2023-10-29 LAB — GLUCOSE, CAPILLARY
Glucose-Capillary: 100 mg/dL — ABNORMAL HIGH (ref 70–99)
Glucose-Capillary: 102 mg/dL — ABNORMAL HIGH (ref 70–99)
Glucose-Capillary: 114 mg/dL — ABNORMAL HIGH (ref 70–99)
Glucose-Capillary: 117 mg/dL — ABNORMAL HIGH (ref 70–99)
Glucose-Capillary: 133 mg/dL — ABNORMAL HIGH (ref 70–99)
Glucose-Capillary: 71 mg/dL (ref 70–99)

## 2023-10-29 LAB — PHOSPHORUS: Phosphorus: 2.2 mg/dL — ABNORMAL LOW (ref 2.5–4.6)

## 2023-10-29 LAB — MAGNESIUM: Magnesium: 2.2 mg/dL (ref 1.7–2.4)

## 2023-10-29 MED ORDER — FENTANYL CITRATE (PF) 100 MCG/2ML IJ SOLN
INTRAMUSCULAR | Status: AC | PRN
  Administered 2023-10-29: 25 ug via INTRAVENOUS

## 2023-10-29 MED ORDER — IOHEXOL 350 MG/ML SOLN
75.0000 mL | Freq: Once | INTRAVENOUS | Status: AC | PRN
  Administered 2023-10-29: 75 mL via INTRAVENOUS

## 2023-10-29 MED ORDER — FAMOTIDINE 40 MG/5ML PO SUSR
20.0000 mg | Freq: Two times a day (BID) | ORAL | Status: DC
  Administered 2023-10-30 – 2023-11-08 (×19): 20 mg
  Filled 2023-10-29 (×21): qty 2.5

## 2023-10-29 MED ORDER — FUROSEMIDE 10 MG/ML IJ SOLN: 40.0000 mg | Freq: Once | INTRAMUSCULAR | Status: DC

## 2023-10-29 MED ORDER — ADULT MULTIVITAMIN LIQUID CH
15.0000 mL | Freq: Once | ORAL | Status: AC
  Administered 2023-10-29: 15 mL
  Filled 2023-10-29: qty 15

## 2023-10-29 MED ORDER — MIDAZOLAM HCL 2 MG/2ML IJ SOLN
INTRAMUSCULAR | Status: AC
  Filled 2023-10-29: qty 4

## 2023-10-29 MED ORDER — OXYCODONE HCL 5 MG/5ML PO SOLN
5.0000 mg | ORAL | Status: DC | PRN
  Administered 2023-10-30 – 2023-11-07 (×10): 5 mg
  Filled 2023-10-29 (×11): qty 5

## 2023-10-29 MED ORDER — CLOPIDOGREL BISULFATE 75 MG PO TABS
75.0000 mg | ORAL_TABLET | Freq: Every day | ORAL | Status: DC
  Administered 2023-10-30 – 2023-11-08 (×10): 75 mg
  Filled 2023-10-29 (×10): qty 1

## 2023-10-29 MED ORDER — MELATONIN 5 MG PO TABS: 5.0000 mg | ORAL_TABLET | Freq: Every evening | ORAL | Status: DC | PRN

## 2023-10-29 MED ORDER — VANCOMYCIN HCL IN DEXTROSE 1-5 GM/200ML-% IV SOLN
INTRAVENOUS | Status: AC
  Filled 2023-10-29: qty 400

## 2023-10-29 MED ORDER — METOPROLOL TARTRATE 50 MG PO TABS
50.0000 mg | ORAL_TABLET | Freq: Two times a day (BID) | ORAL | Status: DC
  Administered 2023-10-30 – 2023-11-08 (×19): 50 mg
  Filled 2023-10-29 (×19): qty 1

## 2023-10-29 MED ORDER — FENTANYL CITRATE (PF) 100 MCG/2ML IJ SOLN
INTRAMUSCULAR | Status: AC
  Filled 2023-10-29: qty 4

## 2023-10-29 MED ORDER — GLUCAGON HCL (RDNA) 1 MG IJ SOLR
INTRAMUSCULAR | Status: AC | PRN
  Administered 2023-10-29: .5 mg via INTRAVENOUS

## 2023-10-29 MED ORDER — QUETIAPINE FUMARATE 50 MG PO TABS
50.0000 mg | ORAL_TABLET | Freq: Every day | ORAL | Status: DC
  Administered 2023-10-30 – 2023-11-07 (×9): 50 mg
  Filled 2023-10-29 (×9): qty 1

## 2023-10-29 MED ORDER — FENTANYL CITRATE PF 50 MCG/ML IJ SOSY
12.5000 ug | PREFILLED_SYRINGE | Freq: Once | INTRAMUSCULAR | Status: AC
  Administered 2023-10-29: 12.5 ug via INTRAVENOUS
  Filled 2023-10-29: qty 1

## 2023-10-29 MED ORDER — METOPROLOL TARTRATE 25 MG/10 ML ORAL SUSPENSION
50.0000 mg | Freq: Two times a day (BID) | ORAL | Status: AC
  Administered 2023-10-29 (×2): 50 mg
  Filled 2023-10-29 (×2): qty 20

## 2023-10-29 MED ORDER — VANCOMYCIN HCL IN DEXTROSE 1-5 GM/200ML-% IV SOLN
INTRAVENOUS | Status: AC | PRN
  Administered 2023-10-29: 1500 mg via INTRAVENOUS

## 2023-10-29 MED ORDER — FLUCONAZOLE 40 MG/ML PO SUSR
400.0000 mg | Freq: Once | ORAL | Status: DC
  Filled 2023-10-29: qty 10

## 2023-10-29 MED ORDER — MORPHINE SULFATE (PF) 2 MG/ML IV SOLN
2.0000 mg | Freq: Once | INTRAVENOUS | Status: AC
  Administered 2023-10-29: 2 mg via INTRAVENOUS
  Filled 2023-10-29: qty 1

## 2023-10-29 MED ORDER — DEXTROSE-SODIUM CHLORIDE 5-0.45 % IV SOLN: INTRAVENOUS | Status: AC

## 2023-10-29 MED ORDER — LIDOCAINE-EPINEPHRINE 1 %-1:100000 IJ SOLN
INTRAMUSCULAR | Status: AC
  Filled 2023-10-29: qty 1

## 2023-10-29 MED ORDER — GUAIFENESIN-DM 100-10 MG/5ML PO SYRP: 5.0000 mL | ORAL_SOLUTION | ORAL | Status: DC | PRN

## 2023-10-29 MED ORDER — LIDOCAINE HCL 1 % IJ SOLN
INTRAMUSCULAR | Status: AC
Start: 2023-10-29 — End: ?
  Filled 2023-10-29: qty 20

## 2023-10-29 MED ORDER — ONDANSETRON HCL 4 MG PO TABS: 4.0000 mg | ORAL_TABLET | Freq: Four times a day (QID) | ORAL | Status: DC | PRN

## 2023-10-29 MED ORDER — GLUCAGON HCL RDNA (DIAGNOSTIC) 1 MG IJ SOLR
INTRAMUSCULAR | Status: AC
  Filled 2023-10-29: qty 1

## 2023-10-29 MED ORDER — MIDAZOLAM HCL 2 MG/2ML IJ SOLN
INTRAMUSCULAR | Status: AC | PRN
  Administered 2023-10-29 (×2): 1 mg via INTRAVENOUS

## 2023-10-29 MED ORDER — ONDANSETRON HCL 4 MG/2ML IJ SOLN
4.0000 mg | Freq: Four times a day (QID) | INTRAMUSCULAR | Status: DC | PRN
  Administered 2023-11-01: 4 mg via INTRAVENOUS
  Filled 2023-10-29: qty 2

## 2023-10-29 MED ORDER — IOHEXOL 300 MG/ML  SOLN
50.0000 mL | Freq: Once | INTRAMUSCULAR | Status: AC | PRN
  Administered 2023-10-29: 10 mL

## 2023-10-29 NOTE — Progress Notes (Signed)
 PROGRESS NOTE   Jeremiah Stevens  ONG:[id number]    DOB: [birth year]    DOA: 09/26/2023  PCP: [name], MD   I have briefly reviewed patients previous medical records in [facility].  Chief Complaint  Patient presents with   Dysphagia    Brief Hospital Course:   48 y.o. adult who with medical history including but not limited to DM2, seizures, HIV AIDS untreated, cryptococcal disease, obesity, polysubstance abuse, prior CVA. She was admitted 09/26/23 with dysphagia x 3 days along with headaches and drooling. He was found to be hypoxic to 80s on room air. CTA was negative for PE but showed multifocal PNA. He was started on abx and BD's. Biktarvy  was continued and ID was consulted. 3/18 He had right facial droop, right sided weakness, dysarthria. Neurology was consulted and recommended CTA and MRI. MRI demonstrated acute right medullary infarct. Despite ongoing dysphagia (MBS showed moderately severe pharyngeal phase dysphagia and imaging showed unilateral bulging on right), pt insisted on eating. He reportedly ordered pizza overnight 3/18 and certainly aspirated. He had threatened to leave AMA despite stroke workup ongoing. 3/19, he had hypoxia for which rapid response was called. While being assess, he continued to desaturate down into the 60s and then 40s. He required BVM and was transferred to the ICU for further evaluation and management. Upon arrival in the ICU, she had ongoing desaturations and bradycardia before developing PEA arrest. He had 9 minutes ACLS prior to ROSC including epi x 4 and bicarb x 2. During intubation attempt, she had copious food material and emesis in her oropharynx. Visualization of vocal cords was difficult due to amount of food material and emesis despite suctioning. On 2nd attempt, ETT was passed successfully and saturations improved shortly thereafter. There was grand mal seizure activity noted after intubation that abated after Midazolam   administration.  Significant hospital events:  3/16 admit 3/18 neuro consult, found to have acute CVA 3/19 Ongoing aspiration despite obvious dysphagia, was planning to leave AMA but sister talked pt into staying. then had a massive aspiration event leading to worse hypoxia and PEA arrest. Txf to [facility] 3/20 PSV cumulative total of 5-6 hrs, following some commands/ purposefully 3/21 self extubated, was reintubated 3/23 Failed SBT due to WOB 3/24 Failed SBT due to apnea 3/25 Tolerated PS >4 hours 3/26: Intubated. 3/31: S/p tracheostomy with ENT. 4/4: trach collar starting 1200  4/5 cont on trach collar  4/7: Care transferred from [facility] to [facility] 4/8: Modified barium swallow.  Failed.  NPO.   Assessment & Plan:   Acute hypoxic resp failure - S/p trach - Multifocal Aspiration PNA - Aspiration event- Hx tobacco use - Patient failed multiple events of extubation and SBT, eventually had tracheostomy done by ENT 3/31. - Currently weaned off the vent, remains trach dependent. -Tracheostomy management per PCCM and ENT Dr. Donalee Fruits.   ENT Dr. Donalee Fruits managing tracheostomy, trach was changed to #6 cuffless on 10/25/2023  -GPC on trach asp 10/12/23, on cefepime #7/7 -SLP following for dysphagia and PMV valve use. -added CPT  -Unfortunately does not have LTAC benefits - Patient remains with  significant secretions, followed closely by SLP and RT    Dysphagia.  - He remains  with core Trak tube, unfortunately did not do well with SLP, will need PEG placement . -  IR consult appreciated, went for PEG tube tomorrow after holding Plavix x 5 days - Nothing by PEG today, except meds after 4 hours from insertion, resume tube feed tomorrow  AKI -Creatinine back up today to 1.47, avoid hypotension, so I will discontinue Norvasc and decrease beta-blockers to 50 cc twice daily to avoid hypotension, continue with IV fluids.  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 -ASA, statin, seen by neurology this admission.   - Resume Plavix tomorrow if ok by IR  Chest pain Elevated D-dimers -Patient with intermittent chest pain overnight , EKG is nonacute, troponins negative x 2, as well elevated, VQ scan is nondiagnostic for PE, but as well cannot rule out given left base perfusion deficiency, so proceeded with CTA overnight which was negative for PE, as well venous Dopplers negative for DVT. [name]   Acute encephalopathy Anxiety, depression  -minimize CNS depressing meds -delirium precautions  -Encephalopathy has resolved.   PEA arrest  -?Sz like activity following, stable echocardiogram with preserved EF no wall motion abnormality. P -supportive care    HFpEF EF 65% HLD -statin, along with beta-blocker   Hypretension. -  Blood pressure is high.  Medications adjusted, increased beta-blocker. - discontinue Cardizem  and switch to Norvasc.  (Will decrease Norvasc to avoid low blood pressure reading) - As needed hydralazine  and monitor.    Normocytic anemia -Follow intermittent CBC.  Hemoglobin stable in the 8-9 g range.   HIV / AIDS Hx +cryptococcal antigen  P -Seen by ID currently on following regimen per ID, post discharge follow-up with ID in the office.  Previously untreated.  Counseled on compliance.  -Continue p.o. fluconazole  400 mg daily on dischare QTc 385 -Continue Descovy/Tivicay  -Continue atovaquone for PJP prophylaxis  DM2, with hyperglycemia, on steroids -cont basal + SSI.  Dosage adjusted on 10/24/2023 as patient was getting transiently hypoglycemic. - Patient is with significant hypoglycemic readings she is on significant dose insulin , I will decrease significantly and allow some elevated readings specially she will be n.p.o. in couple days for anticipated PEG. - Will hold Lantus  this evening and tomorrow morning as will be n.p.o. for PEG tomorrow)  CBG (last 3)  Recent Labs    10/29/23 0132 10/29/23 0316  10/29/23 1209  GLUCAP 71 102* 100*   Lab Results  Component Value Date   HGBA1C 10.8 (H) 09/29/2023    Code status  - Full  Medical noncompliance.   As per chart review and detailed discussion with overnight ICU RN, patient has medical decision making capacity but is very noncompliant-pulled out feeding tube, attempts to pull out tracheostomy, sickly counseled on being compliant, explained the risk of sudden aspiration, respiratory arrest, death.  Patient swallowed a whole cup of water against advice on 10/24/2023, warned again of consequences being aspiration, pneumonia, sudden death.  She completely understands it and says will not do it again.    Body mass index is 33.13 kg/m.  Obesity.  Follow with PCP.    Nutritional Status Nutrition Problem: Increased nutrient needs Etiology: chronic illness Signs/Symptoms: estimated needs Interventions: Tube feeding, Prostat   DVT prophylaxis: SCDs Start: 09/27/23 0018.  Lovenox    Code Status: Full Code:  Family Communication: None at bedside today.  Father Archie Bearded  [phone] on 10/21/23   Sister Korie [phone] on 10/25/2023 at 7:53 AM message left  Disposition:  Status is: Inpatient  Consultants:   ENT Psychiatry PCCM Neurology Infectious disease  Subjective:  Complains of hunger which I have discussed that we need to keep n.p.o. for now except meds till tomorrow Objective:   Vitals:   10/29/23 0935 10/29/23 0950 10/29/23 1136 10/29/23  1210  BP: 129/71 134/87 117/68 116/69  Pulse: 88 85 99 92  Resp: (!) 25 (!) 28 (!) 24 (!) 30  Temp:      TempSrc:      SpO2: 95% 95% 98% 98%  Weight:      Height:        Awake Alert, No new F.N deficits, NG tube in place, tracheostomy site stable with trach collar, some secretions present. Symmetrical Chest wall movement, Good air movement bilaterally, CTAB RRR,No Gallops,Rubs or new Murmurs, No Parasternal Heave +ve B.Sounds, Abd Soft, PEG present site bandaged No Cyanosis,  Clubbing or edema, No new Rash or bruise     Data Reviewed:   I have personally reviewed following labs and imaging studies    Data Review:   Inpatient Medications  Scheduled Meds:  aspirin  81 mg Per Tube Daily   atorvastatin  20 mg Per Tube QHS   atovaquone  1,500 mg Per Tube Q lunch   Chlorhexidine  Gluconate Cloth  6 each Topical Daily   [START ON 10/30/2023] clopidogrel  75 mg Per Tube Daily   dolutegravir  50 mg Per Tube Daily   emtricitabine -tenofovir   1 tablet Per Tube Daily   enoxaparin  (LOVENOX ) injection  50 mg Subcutaneous Q24H   [START ON 10/30/2023] famotidine  20 mg Per Tube BID   feeding supplement (PROSource TF20)  60 mL Per Tube Daily   fiber supplement (BANATROL TF)  60 mL Per Tube BID   fluconazole   400 mg Per Tube Daily   free water  100 mL Per Tube Q4H   Gerhardt's butt cream   Topical BID   insulin  aspart  0-20 Units Subcutaneous Q4H   insulin  aspart  8 Units Subcutaneous 4 times per day   insulin  glargine-yfgn  15 Units Subcutaneous BID   metoprolol tartrate  50 mg Per Tube BID   [START ON 10/30/2023] metoprolol tartrate  50 mg Per Tube BID    morphine injection  2 mg Intravenous Once   multivitamin  15 mL Per Tube Once   multivitamin with minerals  1 tablet Per Tube Daily   nicotine  14 mg Transdermal Daily   nystatin  5 mL Oral QID   pantoprazole (PROTONIX) IV  40 mg Intravenous Q12H   PARoxetine  20 mg Per Tube Daily   [START ON 10/30/2023] QUEtiapine  50 mg Per Tube QHS   Continuous Infusions:  sodium chloride  75 mL/hr at 10/29/23 0123   dextrose  Stopped (10/23/23 0856)   dextrose  5 % and 0.45 % NaCl     dextrose  40 mL/hr at 10/29/23 0136   feeding supplement (OSMOLITE 1.5 CAL) Stopped (10/29/23 0000)   PRN Meds:.acetaminophen  **OR** acetaminophen , alum & mag hydroxide-simeth, bisacodyl, dextrose , fentaNYL  (SUBLIMAZE ) injection, guaiFENesin-dextromethorphan, hydrALAZINE , hydrOXYzine, iohexol, labetalol , levalbuterol, loperamide HCl, melatonin,  ondansetron **OR** ondansetron (ZOFRAN) IV, mouth rinse, oxyCODONE, polyethylene glycol, polyvinyl alcohol  Recent Labs  Lab 10/24/23 0810 10/26/23 0317 10/27/23 0315 10/28/23 0318 10/29/23 0509  WBC 6.3 4.7 4.3 5.5 5.4  HGB 10.2* 9.9* 10.1* 10.4* 9.8*  HCT 31.8* 31.1* 31.6* 31.8* 30.2*  PLT 455* 420* 381 375 322  MCV 91.6 92.3 91.6 90.9 90.7  MCH 29.4 29.4 29.3 29.7 29.4  MCHC 32.1 31.8 32.0 32.7 32.5  RDW 15.5 15.5 15.6* 15.8* 15.7*  LYMPHSABS 2.8 1.3 1.1 1.3 1.4  MONOABS 0.9 0.7 0.7 0.9 0.8  EOSABS 0.1 0.1 0.2 0.2 0.1  BASOSABS 0.0 0.0 0.0 0.0 0.0    Recent Labs  Lab 10/23/23 0645 10/24/23 0810 10/26/23 0317 10/27/23 0315 10/28/23 0318 10/29/23 0509  NA 139 137 134* 134* 133* 134*  K 4.6 4.4 4.4 4.2 4.5 4.4  CL 96* 97* 96* 97* 97* 101  CO2 32 33* 30 28 26 26   ANIONGAP 11 7 8 9 10 7   GLUCOSE 108* 69* 146* 105* 137* 96  BUN 15 16 21* 20 24* 21*  CREATININE 1.01 1.12 1.47* 1.33* 1.48* 1.46*  CRP 2.3*  --   --   --   --   --   DDIMER  --   --   --   --  3.08*  --   PROCALCITON <0.10 <0.10  --   --   --   --   INR  --   --   --   --   --  1.1  MG 2.4  --  2.4 2.3 2.4 2.2  PHOS 3.1  --  2.7 3.4 2.8 2.2*  CALCIUM 9.0 9.4 9.4 9.0 9.1 9.0      Recent Labs  Lab 10/23/23 0645 10/24/23 0810 10/26/23 0317 10/27/23 0315 10/28/23 0318 10/29/23 0509  CRP 2.3*  --   --   --   --   --   DDIMER  --   --   --   --  3.08*  --   PROCALCITON <0.10 <0.10  --   --   --   --   INR  --   --   --   --   --  1.1  MG 2.4  --  2.4 2.3 2.4 2.2  CALCIUM 9.0 9.4 9.4 9.0 9.1 9.0    --------------------------------------------------------------------------------------------------------------- Lab Results  Component Value Date   CHOL 161 09/29/2023   HDL 28 (L) 09/29/2023   LDLCALC 95 09/29/2023   TRIG 219 (H) 09/30/2023   CHOLHDL 5.8 09/29/2023    Lab Results  Component Value Date   HGBA1C 10.8 (H) 09/29/2023     Micro Results No results found for this or any previous  visit (from the past 240 hours).   Radiology Reports  IR GASTROSTOMY TUBE MOD SED Result Date: 10/29/2023 CLINICAL DATA:  Dysphagia, needs enteral feeding support EXAM: PERC PLACEMENT GASTROSTOMY FLUOROSCOPY: Radiation Exposure Index (as provided by the fluoroscopic device): 46.2 mGy air Kerma TECHNIQUE: The procedure, risks, benefits, and alternatives were explained to the patient. Questions regarding the procedure were encouraged and answered. The patient understands and consents to the procedure. As antibiotic prophylaxis, vancomycin 1.5 g was ordered pre-procedure and administered intravenously within one hour of incision. Safe percutaneous window confirmed on recent CT. A 5 French angiographic catheter was placed as orogastric tube. The upper abdomen was prepped with Betadine, draped in usual sterile fashion, and infiltrated locally with 1% lidocaine . Intravenous Fentanyl  25mcg and Versed  2mg  were administered by RN during a total moderate (conscious) sedation time of 10 minutes; the patient's level of consciousness and physiological / cardiorespiratory status were monitored continuously by radiology RN under my direct supervision. 0.5 mg glucagon were given IV. Stomach was insufflated using air through the orogastric tube. An 39 French sheath needle was advanced percutaneously into the gastric lumen under fluoroscopy. Gas could be aspirated and a small contrast injection confirmed intraluminal spread. The sheath was exchanged over a guidewire for a 9 [place] vascular sheath, through which the snare device was advanced and used to snare a guidewire passed through the orogastric tube. This was withdrawn, and the snare attached to the 20 [place] pull-through  gastrostomy tube, which was advanced antegrade, positioned with the internal bumper securing the anterior gastric wall to the anterior abdominal wall. Small contrast injection confirms appropriate positioning. The external bumper was applied and the  catheter was flushed. COMPLICATIONS: COMPLICATIONS none IMPRESSION: 1. Technically successful 20 French pull-through gastrostomy placement under fluoroscopy. Electronically Signed   By: Nicoletta Barrier M.D.   On: 10/29/2023 10:24   VAS US  LOWER EXTREMITY VENOUS (DVT) Result Date: 10/29/2023  Lower Venous DVT Study Patient Name:  Tiara TERRAINE Herrod  Date of Exam:   10/28/2023 Medical Rec #: [id number]            Accession #:    [id number] Date of Birth: 03-Jul-1976           Patient Gender: M Patient Age:   57 years Exam Location:  [facility] Procedure:      VAS US  LOWER EXTREMITY VENOUS (DVT) Referring Phys: [place] Kreitzer --------------------------------------------------------------------------------  Indications: Elevated D-dimer (3.08).  Limitations: Poor ultrasound/tissue interface. Comparison Study: Previous exam on 10/04/2020 was negative for DVT Performing Technologist: Arlyce Berger RVT, RDMS  Examination Guidelines: A complete evaluation includes B-mode imaging, spectral Doppler, color Doppler, and power Doppler as needed of all accessible portions of each vessel. Bilateral testing is considered an integral part of a complete examination. Limited examinations for reoccurring indications may be performed as noted. The reflux portion of the exam is performed with the patient in reverse Trendelenburg.  +---------+---------------+---------+-----------+----------+--------------+ RIGHT    CompressibilityPhasicitySpontaneityPropertiesThrombus Aging +---------+---------------+---------+-----------+----------+--------------+ CFV      Full           Yes      Yes                                 +---------+---------------+---------+-----------+----------+--------------+ SFJ      Full                                                        +---------+---------------+---------+-----------+----------+--------------+ FV Prox  Full           Yes      Yes                                  +---------+---------------+---------+-----------+----------+--------------+ FV Mid   Full           Yes      Yes                                 +---------+---------------+---------+-----------+----------+--------------+ FV DistalFull           Yes      Yes                                 +---------+---------------+---------+-----------+----------+--------------+ PFV      Full                                                        +---------+---------------+---------+-----------+----------+--------------+  POP      Full           Yes      Yes                                 +---------+---------------+---------+-----------+----------+--------------+ PTV      Full                                                        +---------+---------------+---------+-----------+----------+--------------+ PERO     Full                                                        +---------+---------------+---------+-----------+----------+--------------+   +---------+---------------+---------+-----------+----------+--------------+ LEFT     CompressibilityPhasicitySpontaneityPropertiesThrombus Aging +---------+---------------+---------+-----------+----------+--------------+ CFV      Full           Yes      Yes                                 +---------+---------------+---------+-----------+----------+--------------+ SFJ      Full                                                        +---------+---------------+---------+-----------+----------+--------------+ FV Prox  Full           Yes      Yes                                 +---------+---------------+---------+-----------+----------+--------------+ FV Mid   Full           Yes      Yes                                 +---------+---------------+---------+-----------+----------+--------------+ FV DistalFull           Yes      Yes                                  +---------+---------------+---------+-----------+----------+--------------+ PFV      Full                                                        +---------+---------------+---------+-----------+----------+--------------+ POP      Full           Yes      Yes                                 +---------+---------------+---------+-----------+----------+--------------+ PTV  Full                                                        +---------+---------------+---------+-----------+----------+--------------+ PERO     Full                                                        +---------+---------------+---------+-----------+----------+--------------+     Summary: BILATERAL: - No evidence of deep vein thrombosis seen in the lower extremities, bilaterally. -No evidence of popliteal cyst, bilaterally.   *See table(s) above for measurements and observations. Electronically signed by Delaney Fearing on 10/29/2023 at 9:07:26 AM.    Final    DG Abd Portable 1V Result Date: 10/29/2023 CLINICAL DATA:  [id number].  Preoperative evaluation. EXAM: PORTABLE ABDOMEN - 1 VIEW COMPARISON:  Abdomen CT without contrast 10/23/2023. FINDINGS: The bowel gas pattern is nonobstructive. No radio-opaque calculi or other significant radiographic abnormality are seen. There is contrast in the pelvis is probably in the bladder although could be within a distended rectum. There is a Dobbhoff feeding tube with the tip distal stomach. There is no supine evidence of free air. Lung bases are clear.  Mild thoracic spondylosis. IMPRESSION: 1. Nonobstructive bowel gas pattern. 2. Dobbhoff feeding tube with the tip in the distal stomach. 3. Contrast in the pelvis is probably in the bladder although could be within a distended rectum. Electronically Signed   By: Denman Fischer M.D.   On: 10/29/2023 05:42   CT Angio Chest Pulmonary Embolism (PE) W or WO Contrast Result Date: 10/29/2023 CLINICAL DATA:  Elevated D-dimer and abnormal  V/Q scan initial encounter EXAM: CT ANGIOGRAPHY CHEST WITH CONTRAST TECHNIQUE: Multidetector CT imaging of the chest was performed using the standard protocol during bolus administration of intravenous contrast. Multiplanar CT image reconstructions and MIPs were obtained to evaluate the vascular anatomy. RADIATION DOSE REDUCTION: This exam was performed according to the departmental dose-optimization program which includes automated exposure control, adjustment of the mA and/or kV according to patient size and/or use of iterative reconstruction technique. CONTRAST:  75mL OMNIPAQUE IOHEXOL 350 MG/ML SOLN COMPARISON:  Chest x-ray from 10/27/2023, nuclear exam from the previous day. FINDINGS: Cardiovascular: Thoracic aorta shows a normal branching pattern. No aneurysmal dilatation or dissection is noted. Heart is mildly enlarged in size. The pulmonary artery shows a normal branching pattern bilaterally. No findings to suggest pulmonary embolism are noted. Mediastinum/Nodes: Gastric catheter extends into the stomach. Endotracheal tube is noted in satisfactory position. The thoracic inlet is otherwise within normal limits. No hilar or mediastinal adenopathy is noted. Lungs/Pleura: Lungs are well aerated bilaterally. Bibasilar atelectatic changes are seen. No effusion or parenchymal nodule is noted. No evidence of pneumothorax is seen. Upper Abdomen: Within normal limits. Musculoskeletal: Multiple old rib fractures are noted anteriorly on the left with healing to include the second through sixth ribs. Review of the MIP images confirms the above findings. IMPRESSION: No evidence of pulmonary embolism. Bibasilar atelectasis. Multiple healing rib fractures on the left as described. Electronically Signed   By: Violeta Grey M.D.   On: 10/29/2023 01:03   NM Pulmonary Perfusion Result Date: 10/28/2023 CLINICAL DATA:  Pulmonary embolism suspected, high probability. Chest  pain, shortness of breath and difficulty swallowing.  EXAM: NUCLEAR MEDICINE PERFUSION LUNG SCAN TECHNIQUE: Perfusion images were obtained in multiple projections after intravenous injection of radiopharmaceutical. No perfusion imaging performed. RADIOPHARMACEUTICALS:  4.2 mCi Tc-51m MAA IV COMPARISON:  Chest radiographs 10/27/2023 and 10/26/2023. Abdominal CT 10/23/2023. FINDINGS: Relatively decreased perfusion to the left lung base without clear corresponding correlate on recent radiographs. There is mild atelectasis at both lung bases on prior abdominal CT. There are no wedge-shaped perfusion defects in either lung to strongly suggest acute pulmonary embolism. IMPRESSION: Decreased perfusion to the left lung base, indeterminate in etiology on this ventilation-only study. Further evaluation recommended. If patient unable to undergo pulmonary chest CTA with contrast, two-view chest radiographs and bilateral lower extremity venous doppler ultrasound recommended. Electronically Signed   By: Elmon Hagedorn M.D.   On: 10/28/2023 16:21   DG Chest Port 1 View Result Date: 10/27/2023 CLINICAL DATA:  Chest pain EXAM: PORTABLE CHEST 1 VIEW COMPARISON:  10/26/2023 FINDINGS: Tracheostomy is unchanged. Feeding tube in place, seen entering the stomach. The tip is not visualized on this chest x-ray. Heart and mediastinal contours are within normal limits. No focal opacities or effusions. No acute bony abnormality. IMPRESSION: No active cardiopulmonary disease. Electronically Signed   By: Janeece Mechanic M.D.   On: 10/27/2023 22:12   Signature  -   Kelleen Patee Osker Ayoub M.D on 10/29/2023 at 2:30 PM   -  To page go to [email or web address]

## 2023-10-29 NOTE — Progress Notes (Signed)
 OT Cancellation Note  Patient Details Name: Jeremiah Stevens MRN: [id number] DOB: September 01, 1975   Cancelled Treatment:    Reason Eval/Treat Not Completed: Patient at procedure or test/ unavailable (Per RN, pt off unit at IR. OT to reattemp later today as schedule permits.)  Jeremiah Stevens 10/29/2023, 9:03 AM Ernesha Ramone, BS, OTA/S

## 2023-10-29 NOTE — Progress Notes (Signed)
 SLP Cancellation Note  Patient Details Name: Jeremiah Stevens MRN: [id number] DOB: [birth year]   Cancelled treatment:       Reason Eval/Treat Not Completed: New order for swallowing evaluation received after [facility] swallow screen was performed s/p G-tube placement this date. This pt has severe dysphagia and is currently on the SLP caseload. She is NPO except for ice chips in moderation following oral care. SLP plans to f/u on Monday for ongoing assessment of swallowing and readiness to participate in a repeated MBS.      Amil Kale, M.A., CCC-SLP Speech Language Pathology, Acute Rehabilitation Services  Secure Chat preferred [phone]  10/29/2023, 4:58 PM

## 2023-10-29 NOTE — Progress Notes (Signed)
 OT Cancellation Note  Patient Details Name: Jeremiah Stevens MRN: [id number] DOB: 1976/02/09   Cancelled Treatment:    Reason Eval/Treat Not Completed: Patient declined, no reason specified (Patient declined OT stating he was in pain from surgery and was upset because he was not allowed to eat.) Dick Laine, OTA Acute Rehabilitation Services  Office [phone]  [name] Laine 10/29/2023, 2:38 PM

## 2023-10-29 NOTE — Progress Notes (Signed)
 Physical Therapy Treatment Patient Details Name: Jeremiah Stevens MRN: [id number] DOB: 1975-09-20 Today's Date: 10/29/2023   History of Present Illness 48 yo pt admitted to [facility] 09/26/23 for difficulty swallowing. 3/19 PEA, ROSC after 2 min, then bradycardic arrest 1 min later, CPR resumed, pt intubated, and ROSC 6 min later. Seizure-like activity also noted briefly after ROSC. 3/20 pt transferred to [facility]. 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 3/21 self extubated and reintubated. 3/31 trach. Tracheostomy changed to #6 cuffless 4/14. S/p PEG 4/18. PMHx: HIV/AIDS, HTN, herpes, migraine, depression, DM II, polysubstance use, and seizure.    PT Comments  Making great strides towards acute functional goals now. Supervision for safety with transfer. Close CGA for gait with RW up to 200 feet on RA. Still with some minor instability/erratic RW control with turns but certainly improving. States she can live with brother and sister-in-law at d/c who can provide 24/7 assist, (not confirmed during initial evaluation or subsequent visits.) If pt continues to make functional progress to supervision/mod I level, may be appropriate for HHPT follow-up if family can truly provide adequate support. For now still recommend <3 hours/day inpatient therapy. Patient will continue to benefit from skilled physical therapy services to further improve independence with functional mobility.     If plan is discharge home, recommend the following: Assistance with cooking/housework;Assist for transportation;Help with stairs or ramp for entrance;A little help with walking and/or transfers;A little help with bathing/dressing/bathroom   Can travel by private vehicle     Yes  Equipment Recommendations  Rolling walker (2 wheels)    Recommendations for Other Services       Precautions / Restrictions Precautions Precautions: Fall;Other (comment) Recall of Precautions/Restrictions:  Intact Precaution/Restrictions Comments: trach, cortrak, PMV Required Braces or Orthoses: Other Brace Other Brace: Abdominal binder for PEG Restrictions Weight Bearing Restrictions Per Provider Order: No     Mobility  Bed Mobility               General bed mobility comments: In recliner    Transfers Overall transfer level: Needs assistance Equipment used: Rolling walker (2 wheels), None Transfers: Sit to/from Stand Sit to Stand: Supervision           General transfer comment: Supervision for safety. Cues for technique performed x2 once with RW and once with out. More stable with RW.    Ambulation/Gait Ambulation/Gait assistance: Contact guard assist, +2 safety/equipment Gait Distance (Feet): 200 Feet Assistive device: Rolling walker (2 wheels) Gait Pattern/deviations: Step-through pattern, Decreased stride length, Trunk flexed, Drifts right/left Gait velocity: decreased Gait velocity interpretation: <1.8 ft/sec, indicate of risk for recurrent falls   General Gait Details: Still slightly erratic control of RW in congested areas and with turns. Cues to keep all four points of contact of the RW on the floor with turns. No overt LOB.CGA for safety, poor waveform but SpO2 99% on RA upon returning to room. Further instruction for gait symmetry and safety awareness.   Stairs             Wheelchair Mobility     Tilt Bed    Modified Rankin (Stroke Patients Only) Modified Rankin (Stroke Patients Only) Pre-Morbid Rankin Score: No symptoms Modified Rankin: Moderately severe disability     Balance Overall balance assessment: Needs assistance Sitting-balance support: No upper extremity supported, Feet supported Sitting balance-Leahy Scale: Fair Sitting balance - Comments: in recliner   Standing balance support: During functional activity, No upper extremity supported Standing balance-Leahy Scale: Fair  Standing balance comment: Stood while assisting to fit Abd  binder.                            Communication Communication Communication: Impaired Factors Affecting Communication: Trach/intubated;Passey - Muir valve  Cognition Arousal: Alert Behavior During Therapy: WFL for tasks assessed/performed   PT - Cognitive impairments: No apparent impairments                         Following commands: Intact Following commands impaired: Only follows one step commands consistently    Cueing Cueing Techniques: Verbal cues  Exercises      General Comments General comments (skin integrity, edema, etc.): Abd binder too small, RN notified, requested front desk to order XL.      Pertinent Vitals/Pain Pain Assessment Pain Assessment: Faces Faces Pain Scale: Hurts a little bit Pain Location: Belly, surgical site Pain Descriptors / Indicators: Operative site guarding Pain Intervention(s): Monitored during session, Repositioned    Home Living                          Prior Function            PT Goals (current goals can now be found in the care plan section) Acute Rehab PT Goals Patient Stated Goal: Return Home PT Goal Formulation: With patient Time For Goal Achievement: 11/03/23 Potential to Achieve Goals: Good Progress towards PT goals: Progressing toward goals    Frequency    Min 2X/week      PT Plan      Co-evaluation              AM-PAC PT "6 Clicks" Mobility   Outcome Measure  Help needed turning from your back to your side while in a flat bed without using bedrails?: A Little Help needed moving from lying on your back to sitting on the side of a flat bed without using bedrails?: A Little Help needed moving to and from a bed to a chair (including a wheelchair)?: A Little Help needed standing up from a chair using your arms (e.g., wheelchair or bedside chair)?: A Little Help needed to walk in hospital room?: A Little Help needed climbing 3-5 steps with a railing? : A Little 6 Click  Score: 18    End of Session Equipment Utilized During Treatment: Gait belt Activity Tolerance: Patient tolerated treatment well Patient left: in chair;with call bell/phone within reach;with chair alarm set Nurse Communication: Mobility status (XL abdominal binder needed.) PT Visit Diagnosis: Unsteadiness on feet (R26.81);Muscle weakness (generalized) (M62.81);Other abnormalities of gait and mobility (R26.89);Difficulty in walking, not elsewhere classified (R26.2);Other symptoms and signs involving the nervous system (R29.898) Hemiplegia - Right/Left: Right Hemiplegia - dominant/non-dominant: Non-dominant Hemiplegia - caused by: Cerebral infarction     Time: 1610-9604 PT Time Calculation (min) (ACUTE ONLY): 20 min  Charges:    $Gait Training: 8-22 mins PT General Charges $$ ACUTE PT VISIT: 1 Visit                     Jory Ng, PT, DPT [facility]  Rehabilitation Services Physical Therapist Office: [phone] Website: [email or web address]    Alinda Irani 10/29/2023, 1:17 PM

## 2023-10-29 NOTE — Progress Notes (Addendum)
 MEDICATION-RELATED CONSULT NOTE   IR Procedure Consult - Anticoagulant/Antiplatelet PTA/Inpatient Med List Review by Pharmacist    Procedure: PEG placement    Completed: 0924 on 10/29/23  Post-Procedural bleeding risk per IR MD assessment:  high  Antithrombotic medications on inpatient or PTA profile prior to procedure:    - Lovenox  50 mg SQ Q24hr for VTE prophylaxis (BMI >30, weight 110 kg) - daily dose scheduled for 10am, held today for procedure  - Plavix held after 4/12 dose for procedure   Recommended restart time per IR Post-Procedure Guidelines:   - 24 hours post-procedure - EBL: minimal  Plan:     - Resume Lovenox  50 mg SQ Q24h on 10/30/23, 24 hours post-procedure. - Resume Plavix 75 mg daily on 10/30/23, 24 hours post-procedure.  Adolphus Akin, [place] 10/29/2023 9:48 AM

## 2023-10-29 NOTE — Plan of Care (Signed)
  Problem: Health Behavior/Discharge Planning: Goal: Ability to identify and utilize available resources and services will improve Outcome: Progressing Goal: Ability to manage health-related needs will improve Outcome: Progressing   Problem: Metabolic: Goal: Ability to maintain appropriate glucose levels will improve Outcome: Progressing   Problem: Nutritional: Goal: Maintenance of adequate nutrition will improve Outcome: Progressing Goal: Progress toward achieving an optimal weight will improve Outcome: Progressing

## 2023-10-29 NOTE — Progress Notes (Signed)
 Patient is not available for 08:00 trach check. RT will check back later.

## 2023-10-29 NOTE — Progress Notes (Signed)
 PT Cancellation Note  Patient Details Name: Shalik Sanfilippo MRN: [id number] DOB: 1975-08-01   Cancelled Treatment:    Reason Eval/Treat Not Completed: Patient at procedure or test/unavailable  Off unit for PEG placement. Will attempt to follow-up this afternoon as schedule permits.   Jory Ng, PT, DPT [facility]  Rehabilitation Services Physical Therapist Office: [phone] Website: [email or web address]   Alinda Irani 10/29/2023, 8:12 AM

## 2023-10-29 NOTE — Procedures (Signed)
  Procedure:  Perc gastrostomy catheter placement 59f Preprocedure diagnosis: The encounter diagnosis was Acute hypoxic respiratory failure (HCC). Postprocedure diagnosis: same EBL:    minimal Complications:   none immediate  See full dictation in [employer].  Nicky Barrack MD Main # [phone] Pager  [phone] Mobile [phone]

## 2023-10-30 DIAGNOSIS — J189 Pneumonia, unspecified organism: Secondary | ICD-10-CM | POA: Diagnosis not present

## 2023-10-30 LAB — CBC WITH DIFFERENTIAL/PLATELET
Abs Immature Granulocytes: 0.02 10*3/uL (ref 0.00–0.07)
Basophils Absolute: 0 10*3/uL (ref 0.0–0.1)
Basophils Relative: 0 %
Eosinophils Absolute: 0.2 10*3/uL (ref 0.0–0.5)
Eosinophils Relative: 4 %
HCT: 31.2 % — ABNORMAL LOW (ref 39.0–52.0)
Hemoglobin: 10.1 g/dL — ABNORMAL LOW (ref 13.0–17.0)
Immature Granulocytes: 0 %
Lymphocytes Relative: 30 %
Lymphs Abs: 1.4 10*3/uL (ref 0.7–4.0)
MCH: 29.1 pg (ref 26.0–34.0)
MCHC: 32.4 g/dL (ref 30.0–36.0)
MCV: 89.9 fL (ref 80.0–100.0)
Monocytes Absolute: 0.8 10*3/uL (ref 0.1–1.0)
Monocytes Relative: 17 %
Neutro Abs: 2.2 10*3/uL (ref 1.7–7.7)
Neutrophils Relative %: 49 %
Platelets: 333 10*3/uL (ref 150–400)
RBC: 3.47 MIL/uL — ABNORMAL LOW (ref 4.22–5.81)
RDW: 15.6 % — ABNORMAL HIGH (ref 11.5–15.5)
WBC: 4.6 10*3/uL (ref 4.0–10.5)
nRBC: 0 % (ref 0.0–0.2)

## 2023-10-30 LAB — GLUCOSE, CAPILLARY
Glucose-Capillary: 116 mg/dL — ABNORMAL HIGH (ref 70–99)
Glucose-Capillary: 122 mg/dL — ABNORMAL HIGH (ref 70–99)
Glucose-Capillary: 102 mg/dL — ABNORMAL HIGH (ref 70–99)
Glucose-Capillary: 94 mg/dL (ref 70–99)
Glucose-Capillary: 109 mg/dL — ABNORMAL HIGH (ref 70–99)

## 2023-10-30 LAB — BASIC METABOLIC PANEL WITH GFR
Anion gap: 11 (ref 5–15)
BUN: 14 mg/dL (ref 6–20)
CO2: 25 mmol/L (ref 22–32)
Calcium: 9 mg/dL (ref 8.9–10.3)
Chloride: 96 mmol/L — ABNORMAL LOW (ref 98–111)
Creatinine, Ser: 1.3 mg/dL — ABNORMAL HIGH (ref 0.61–1.24)
GFR, Estimated: >60 mL/min (ref >60)
Glucose, Bld: 141 mg/dL — ABNORMAL HIGH (ref 70–99)
Potassium: 3.9 mmol/L (ref 3.5–5.1)
Sodium: 132 mmol/L — ABNORMAL LOW (ref 135–145)

## 2023-10-30 MED ORDER — ACETAMINOPHEN 10 MG/ML IV SOLN
1000.0000 mg | Freq: Once | INTRAVENOUS | Status: AC
  Administered 2023-10-30: 1000 mg via INTRAVENOUS
  Filled 2023-10-30: qty 100

## 2023-10-30 MED ORDER — OSMOLITE 1.5 CAL PO LIQD
1000.0000 mL | ORAL | Status: DC
  Administered 2023-10-30 – 2023-11-04 (×5): 1000 mL
  Filled 2023-10-30 (×9): qty 1000

## 2023-10-30 NOTE — Plan of Care (Signed)
  Problem: Skin Integrity: Goal: Risk for impaired skin integrity will decrease Outcome: Progressing   Problem: Health Behavior/Discharge Planning: Goal: Ability to manage health-related needs will improve Outcome: Progressing   Problem: Clinical Measurements: Goal: Ability to maintain clinical measurements within normal limits will improve Outcome: Progressing Goal: Respiratory complications will improve Outcome: Progressing   Problem: Activity: Goal: Risk for activity intolerance will decrease Outcome: Progressing   Problem: Pain Managment: Goal: General experience of comfort will improve and/or be controlled Outcome: Progressing

## 2023-10-30 NOTE — Progress Notes (Signed)
 [name], adult   DOB: [birth year], 48 y.o.   MRN: [id number] Pt s/p G tube placement yesterday; afebrile, hgb stable; WBC nl; currently doing ok ; siting up in chair; has some expected soreness at G tube insertion site; G tube intact, insertion site clean and dry, no leaking noted, abd soft. Ok to use.

## 2023-10-30 NOTE — Progress Notes (Signed)
 PROGRESS NOTE   Jeremiah Stevens  ZOX:[id number]    DOB: May 03, 1976    DOA: 09/26/2023  PCP: [name], MD   I have briefly reviewed patients previous medical records in [facility]  Chief Complaint  Patient presents with   Dysphagia    Brief Hospital Course:   48 y.o. adult who with medical history including but not limited to DM2, seizures, HIV AIDS untreated, cryptococcal disease, obesity, polysubstance abuse, prior CVA. She was admitted 09/26/23 with dysphagia x 3 days along with headaches and drooling. He was found to be hypoxic to 80s on room air. CTA was negative for PE but showed multifocal PNA. He was started on abx and BD's. Biktarvy  was continued and ID was consulted. 3/18 He had right facial droop, right sided weakness, dysarthria. Neurology was consulted and recommended CTA and MRI. MRI demonstrated acute right medullary infarct. Despite ongoing dysphagia (MBS showed moderately severe pharyngeal phase dysphagia and imaging showed unilateral bulging on right), pt insisted on eating. He reportedly ordered pizza overnight 3/18 and certainly aspirated. He had threatened to leave AMA despite stroke workup ongoing. 3/19, he had hypoxia for which rapid response was called. While being assess, he continued to desaturate down into the 60s and then 40s. He required BVM and was transferred to the ICU for further evaluation and management. Upon arrival in the ICU, she had ongoing desaturations and bradycardia before developing PEA arrest. He had 9 minutes ACLS prior to ROSC including epi x 4 and bicarb x 2. During intubation attempt, she had copious food material and emesis in her oropharynx. Visualization of vocal cords was difficult due to amount of food material and emesis despite suctioning. On 2nd attempt, ETT was passed successfully and saturations improved shortly thereafter. There was grand mal seizure activity noted after intubation that abated after Midazolam   administration.  Significant hospital events:  3/16 admit 3/18 neuro consult, found to have acute CVA 3/19 Ongoing aspiration despite obvious dysphagia, was planning to leave AMA but sister talked pt into staying. then had a massive aspiration event leading to worse hypoxia and PEA arrest. Txf to [facility] 3/20 PSV cumulative total of 5-6 hrs, following some commands/ purposefully 3/21 self extubated, was reintubated 3/23 Failed SBT due to WOB 3/24 Failed SBT due to apnea 3/25 Tolerated PS >4 hours 3/26: Intubated. 3/31: S/p tracheostomy with ENT. 4/4: trach collar starting 1200  4/5 cont on trach collar  4/7: Care transferred from [facility] to [facility] 4/8: Modified barium swallow.  Failed.  NPO. 4/19: PEG tube insertion by IR   Assessment & Plan:   Acute hypoxic resp failure - S/p trach - Multifocal Aspiration PNA - Aspiration event- Hx tobacco use - Patient failed multiple events of extubation and SBT, eventually had tracheostomy done by ENT 3/31. - Currently weaned off the vent, remains trach dependent. -Tracheostomy management per PCCM and ENT Dr. Donalee Fruits.   ENT Dr. Donalee Fruits managing tracheostomy, trach was changed to #6 cuffless on 10/25/2023  -GPC on trach asp 10/12/23, on cefepime #7/7 -SLP following for dysphagia and PMV valve use. -added CPT  -Unfortunately does not have LTAC benefits - Patient remains with  significant secretions, followed closely by SLP and RT    Dysphagia.  - Failed modified barium swallow, IR consulted for PEG tube insertion, PEG inserted by IR 4/18 after holding Plavix x 5 days, patient is back on Plavix today.   - Okay to use PEG, resumed tube feed today  AKI -Creatinine back up today to  1.47, avoid hypotension, so I will discontinue Norvasc and decrease beta-blockers to 50 cc twice daily to avoid hypotension, continue with IV fluids.  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 -ASA, statin, seen  by neurology this admission.   - Resume Plavix tomorrow if ok by IR  Chest pain Elevated D-dimers -Patient with intermittent chest pain overnight , EKG is nonacute, troponins negative x 2, as well elevated, VQ scan is nondiagnostic for PE, but as well cannot rule out given left base perfusion deficiency, so proceeded with CTA, was negative for PE, as well venous Dopplers negative for DVT. [name]   Acute encephalopathy Anxiety, depression  -minimize CNS depressing meds -delirium precautions  -Encephalopathy has resolved.   PEA arrest  -?Sz like activity following, stable echocardiogram with preserved EF no wall motion abnormality. P -supportive care    HFpEF EF 65% HLD -statin, along with beta-blocker   Hypretension. -  Blood pressure is high.  Medications adjusted, increased beta-blocker. - discontinue Cardizem  and switch to Norvasc.  (Will decrease Norvasc to avoid low blood pressure reading) - As needed hydralazine  and monitor.    Normocytic anemia -Follow intermittent CBC.  Hemoglobin stable in the 8-9 g range.   HIV / AIDS Hx +cryptococcal antigen  P -Seen by ID currently on following regimen per ID, post discharge follow-up with ID in the office.  Previously untreated.  Counseled on compliance.  -Continue p.o. fluconazole  400 mg daily on dischare QTc 385 -Continue Descovy/Tivicay  -Continue atovaquone for PJP prophylaxis  DM2, with hyperglycemia, on steroids -cont basal + SSI.  Dosage adjusted on 10/24/2023 as patient was getting transiently hypoglycemic. - Patient is with significant hypoglycemic readings she is on significant dose insulin , I will decrease significantly and allow some elevated readings specially she will be n.p.o. in couple days for anticipated PEG. - Will hold Lantus  this evening and tomorrow morning as will be n.p.o. for PEG tomorrow)  CBG (last 3)  Recent Labs    10/30/23 0333 10/30/23 0811 10/30/23 1201  GLUCAP 116* 122* 102*   Lab Results   Component Value Date   HGBA1C 10.8 (H) 09/29/2023    Code status  - Full  Medical noncompliance.   As per chart review and detailed discussion with overnight ICU RN, patient has medical decision making capacity but is very noncompliant-pulled out feeding tube, attempts to pull out tracheostomy, sickly counseled on being compliant, explained the risk of sudden aspiration, respiratory arrest, death.  Patient swallowed a whole cup of water against advice on 10/24/2023, warned again of consequences being aspiration, pneumonia, sudden death.  She completely understands it and says will not do it again.    Body mass index is 33.13 kg/m.  Obesity.  Follow with PCP.    Nutritional Status Nutrition Problem: Increased nutrient needs Etiology: chronic illness Signs/Symptoms: estimated needs Interventions: Tube feeding, Prostat   DVT prophylaxis: SCDs Start: 09/27/23 0018.  Lovenox    Code Status: Full Code:  Family Communication: None at bedside today.  Father Archie Bearded  [phone] on 10/21/23   Sister Korie [phone] on 10/25/2023 at 7:53 AM message left  Disposition:  Status is: Inpatient  Consultants:   ENT Psychiatry PCCM Neurology Infectious disease  Subjective:  No significant events overnight, he complains of hunger this morning, back on his tube feed currently Objective:   Vitals:   10/30/23 0812 10/30/23 0814 10/30/23 1112 10/30/23 1505  BP:  110/78    Pulse: 86 79  89 83  Resp: (!) 22 (!) 22 (!) 25 19  Temp:  98.2 F (36.8 C)    TempSrc:  Oral    SpO2: 96% 95% 96% (!) 85%  Weight:      Height:        Awake Alert, No new F.N deficits, NG tube in place, tracheostomy site stable with trach collar, less secretion today  symmetrical Chest wall movement, Good air movement bilaterally, CTAB RRR,No Gallops,Rubs or new Murmurs, No Parasternal Heave +ve B.Sounds, Abd Soft, PEG present site bandaged No Cyanosis, Clubbing or edema, No new Rash or bruise     Data  Reviewed:   I have personally reviewed following labs and imaging studies    Data Review:   Inpatient Medications  Scheduled Meds:  aspirin  81 mg Per Tube Daily   atorvastatin  20 mg Per Tube QHS   atovaquone  1,500 mg Per Tube Q lunch   Chlorhexidine  Gluconate Cloth  6 each Topical Daily   clopidogrel  75 mg Per Tube Daily   dolutegravir  50 mg Per Tube Daily   emtricitabine -tenofovir   1 tablet Per Tube Daily   enoxaparin  (LOVENOX ) injection  50 mg Subcutaneous Q24H   famotidine  20 mg Per Tube BID   feeding supplement (PROSource TF20)  60 mL Per Tube Daily   fiber supplement (BANATROL TF)  60 mL Per Tube BID   fluconazole   400 mg Per Tube Daily   free water  100 mL Per Tube Q4H   Gerhardt's butt cream   Topical BID   insulin  aspart  0-20 Units Subcutaneous Q4H   insulin  aspart  8 Units Subcutaneous 4 times per day   insulin  glargine-yfgn  15 Units Subcutaneous BID   metoprolol tartrate  50 mg Per Tube BID   multivitamin with minerals  1 tablet Per Tube Daily   nicotine  14 mg Transdermal Daily   PARoxetine  20 mg Per Tube Daily   QUEtiapine  50 mg Per Tube QHS   Continuous Infusions:  sodium chloride  75 mL/hr at 10/29/23 0123   dextrose  Stopped (10/23/23 0856)   feeding supplement (OSMOLITE 1.5 CAL) 1,000 mL (10/30/23 1113)   PRN Meds:.acetaminophen  **OR** acetaminophen , alum & mag hydroxide-simeth, bisacodyl, dextrose , fentaNYL  (SUBLIMAZE ) injection, guaiFENesin-dextromethorphan, hydrALAZINE , hydrOXYzine, iohexol, labetalol , levalbuterol, loperamide HCl, melatonin, ondansetron **OR** ondansetron (ZOFRAN) IV, mouth rinse, oxyCODONE, polyethylene glycol, polyvinyl alcohol  Recent Labs  Lab 10/26/23 0317 10/27/23 0315 10/28/23 0318 10/29/23 0509 10/30/23 0512  WBC 4.7 4.3 5.5 5.4 4.6  HGB 9.9* 10.1* 10.4* 9.8* 10.1*  HCT 31.1* 31.6* 31.8* 30.2* 31.2*  PLT 420* 381 375 322 333  MCV 92.3 91.6 90.9 90.7 89.9  MCH 29.4 29.3 29.7 29.4 29.1  MCHC 31.8 32.0 32.7 32.5  32.4  RDW 15.5 15.6* 15.8* 15.7* 15.6*  LYMPHSABS 1.3 1.1 1.3 1.4 1.4  MONOABS 0.7 0.7 0.9 0.8 0.8  EOSABS 0.1 0.2 0.2 0.1 0.2  BASOSABS 0.0 0.0 0.0 0.0 0.0    Recent Labs  Lab 10/24/23 0810 10/26/23 0317 10/27/23 0315 10/28/23 0318 10/29/23 0509 10/30/23 0512  NA 137 134* 134* 133* 134* 132*  K 4.4 4.4 4.2 4.5 4.4 3.9  CL 97* 96* 97* 97* 101 96*  CO2 33* 30 28 26 26 25   ANIONGAP 7 8 9 10 7 11   GLUCOSE 69* 146* 105* 137* 96 141*  BUN 16 21* 20 24* 21* 14  CREATININE 1.12 1.47* 1.33* 1.48* 1.46* 1.30*  DDIMER  --   --   --  3.08*  --   --   PROCALCITON <0.10  --   --   --   --   --   INR  --   --   --   --  1.1  --   MG  --  2.4 2.3 2.4 2.2  --   PHOS  --  2.7 3.4 2.8 2.2*  --   CALCIUM 9.4 9.4 9.0 9.1 9.0 9.0      Recent Labs  Lab 10/24/23 0810 10/26/23 0317 10/27/23 0315 10/28/23 0318 10/29/23 0509 10/30/23 0512  DDIMER  --   --   --  3.08*  --   --   PROCALCITON <0.10  --   --   --   --   --   INR  --   --   --   --  1.1  --   MG  --  2.4 2.3 2.4 2.2  --   CALCIUM 9.4 9.4 9.0 9.1 9.0 9.0    --------------------------------------------------------------------------------------------------------------- Lab Results  Component Value Date   CHOL 161 09/29/2023   HDL 28 (L) 09/29/2023   LDLCALC 95 09/29/2023   TRIG 219 (H) 09/30/2023   CHOLHDL 5.8 09/29/2023    Lab Results  Component Value Date   HGBA1C 10.8 (H) 09/29/2023     Micro Results No results found for this or any previous visit (from the past 240 hours).   Radiology Reports  IR GASTROSTOMY TUBE MOD SED Result Date: 10/29/2023 CLINICAL DATA:  Dysphagia, needs enteral feeding support EXAM: PERC PLACEMENT GASTROSTOMY FLUOROSCOPY: Radiation Exposure Index (as provided by the fluoroscopic device): 46.2 mGy air Kerma TECHNIQUE: The procedure, risks, benefits, and alternatives were explained to the patient. Questions regarding the procedure were encouraged and answered. The patient understands and  consents to the procedure. As antibiotic prophylaxis, vancomycin 1.5 g was ordered pre-procedure and administered intravenously within one hour of incision. Safe percutaneous window confirmed on recent CT. A 5 French angiographic catheter was placed as orogastric tube. The upper abdomen was prepped with Betadine, draped in usual sterile fashion, and infiltrated locally with 1% lidocaine . Intravenous Fentanyl  25mcg and Versed  2mg  were administered by RN during a total moderate (conscious) sedation time of 10 minutes; the patient's level of consciousness and physiological / cardiorespiratory status were monitored continuously by radiology RN under my direct supervision. 0.5 mg glucagon were given IV. Stomach was insufflated using air through the orogastric tube. An 83 French sheath needle was advanced percutaneously into the gastric lumen under fluoroscopy. Gas could be aspirated and a small contrast injection confirmed intraluminal spread. The sheath was exchanged over a guidewire for a 9 [place] vascular sheath, through which the snare device was advanced and used to snare a guidewire passed through the orogastric tube. This was withdrawn, and the snare attached to the 20 French pull-through gastrostomy tube, which was advanced antegrade, positioned with the internal bumper securing the anterior gastric wall to the anterior abdominal wall. Small contrast injection confirms appropriate positioning. The external bumper was applied and the catheter was flushed. COMPLICATIONS: COMPLICATIONS none IMPRESSION: 1. Technically successful 20 French pull-through gastrostomy placement under fluoroscopy. Electronically Signed   By: Nicoletta Barrier M.D.   On: 10/29/2023 10:24   VAS US  LOWER EXTREMITY VENOUS (DVT) Result Date: 10/29/2023  Lower Venous DVT Study Patient Name:  [name] DIOP Crumble  Date of Exam:   10/28/2023 Medical Rec #: [id number]            Accession #:  [id number] Date of Birth: August 24, 1975           Patient  Gender: M Patient Age:   10 years Exam Location:  [facility] Procedure:      VAS US  LOWER EXTREMITY VENOUS (DVT) Referring Phys: Cheridan Kibler --------------------------------------------------------------------------------  Indications: Elevated D-dimer (3.08).  Limitations: Poor ultrasound/tissue interface. Comparison Study: Previous exam on 10/04/2020 was negative for DVT Performing Technologist: Arlyce Berger RVT, RDMS  Examination Guidelines: A complete evaluation includes B-mode imaging, spectral Doppler, color Doppler, and power Doppler as needed of all accessible portions of each vessel. Bilateral testing is considered an integral part of a complete examination. Limited examinations for reoccurring indications may be performed as noted. The reflux portion of the exam is performed with the patient in reverse Trendelenburg.  +---------+---------------+---------+-----------+----------+--------------+ RIGHT    CompressibilityPhasicitySpontaneityPropertiesThrombus Aging +---------+---------------+---------+-----------+----------+--------------+ CFV      Full           Yes      Yes                                 +---------+---------------+---------+-----------+----------+--------------+ SFJ      Full                                                        +---------+---------------+---------+-----------+----------+--------------+ FV Prox  Full           Yes      Yes                                 +---------+---------------+---------+-----------+----------+--------------+ FV Mid   Full           Yes      Yes                                 +---------+---------------+---------+-----------+----------+--------------+ FV DistalFull           Yes      Yes                                 +---------+---------------+---------+-----------+----------+--------------+ PFV      Full                                                         +---------+---------------+---------+-----------+----------+--------------+ POP      Full           Yes      Yes                                 +---------+---------------+---------+-----------+----------+--------------+ PTV      Full                                                        +---------+---------------+---------+-----------+----------+--------------+  PERO     Full                                                        +---------+---------------+---------+-----------+----------+--------------+   +---------+---------------+---------+-----------+----------+--------------+ LEFT     CompressibilityPhasicitySpontaneityPropertiesThrombus Aging +---------+---------------+---------+-----------+----------+--------------+ CFV      Full           Yes      Yes                                 +---------+---------------+---------+-----------+----------+--------------+ SFJ      Full                                                        +---------+---------------+---------+-----------+----------+--------------+ FV Prox  Full           Yes      Yes                                 +---------+---------------+---------+-----------+----------+--------------+ FV Mid   Full           Yes      Yes                                 +---------+---------------+---------+-----------+----------+--------------+ FV DistalFull           Yes      Yes                                 +---------+---------------+---------+-----------+----------+--------------+ PFV      Full                                                        +---------+---------------+---------+-----------+----------+--------------+ POP      Full           Yes      Yes                                 +---------+---------------+---------+-----------+----------+--------------+ PTV      Full                                                         +---------+---------------+---------+-----------+----------+--------------+ PERO     Full                                                        +---------+---------------+---------+-----------+----------+--------------+  Summary: BILATERAL: - No evidence of deep vein thrombosis seen in the lower extremities, bilaterally. -No evidence of popliteal cyst, bilaterally.   *See table(s) above for measurements and observations. Electronically signed by Delaney Fearing on 10/29/2023 at 9:07:26 AM.    Final    DG Abd Portable 1V Result Date: 10/29/2023 CLINICAL DATA:  [id number].  Preoperative evaluation. EXAM: PORTABLE ABDOMEN - 1 VIEW COMPARISON:  Abdomen CT without contrast 10/23/2023. FINDINGS: The bowel gas pattern is nonobstructive. No radio-opaque calculi or other significant radiographic abnormality are seen. There is contrast in the pelvis is probably in the bladder although could be within a distended rectum. There is a Dobbhoff feeding tube with the tip distal stomach. There is no supine evidence of free air. Lung bases are clear.  Mild thoracic spondylosis. IMPRESSION: 1. Nonobstructive bowel gas pattern. 2. Dobbhoff feeding tube with the tip in the distal stomach. 3. Contrast in the pelvis is probably in the bladder although could be within a distended rectum. Electronically Signed   By: Denman Fischer M.D.   On: 10/29/2023 05:42   CT Angio Chest Pulmonary Embolism (PE) W or WO Contrast Result Date: 10/29/2023 CLINICAL DATA:  Elevated D-dimer and abnormal V/Q scan initial encounter EXAM: CT ANGIOGRAPHY CHEST WITH CONTRAST TECHNIQUE: Multidetector CT imaging of the chest was performed using the standard protocol during bolus administration of intravenous contrast. Multiplanar CT image reconstructions and MIPs were obtained to evaluate the vascular anatomy. RADIATION DOSE REDUCTION: This exam was performed according to the departmental dose-optimization program which includes automated exposure control,  adjustment of the mA and/or kV according to patient size and/or use of iterative reconstruction technique. CONTRAST:  75mL OMNIPAQUE IOHEXOL 350 MG/ML SOLN COMPARISON:  Chest x-ray from 10/27/2023, nuclear exam from the previous day. FINDINGS: Cardiovascular: Thoracic aorta shows a normal branching pattern. No aneurysmal dilatation or dissection is noted. Heart is mildly enlarged in size. The pulmonary artery shows a normal branching pattern bilaterally. No findings to suggest pulmonary embolism are noted. Mediastinum/Nodes: Gastric catheter extends into the stomach. Endotracheal tube is noted in satisfactory position. The thoracic inlet is otherwise within normal limits. No hilar or mediastinal adenopathy is noted. Lungs/Pleura: Lungs are well aerated bilaterally. Bibasilar atelectatic changes are seen. No effusion or parenchymal nodule is noted. No evidence of pneumothorax is seen. Upper Abdomen: Within normal limits. Musculoskeletal: Multiple old rib fractures are noted anteriorly on the left with healing to include the second through sixth ribs. Review of the MIP images confirms the above findings. IMPRESSION: No evidence of pulmonary embolism. Bibasilar atelectasis. Multiple healing rib fractures on the left as described. Electronically Signed   By: Violeta Grey M.D.   On: 10/29/2023 01:03   Signature  -   Kelleen Patee Eladio Dentremont M.D on 10/30/2023 at 3:44 PM   -  To page go to [email or web address]

## 2023-10-31 DIAGNOSIS — Z930 Tracheostomy status: Secondary | ICD-10-CM | POA: Diagnosis not present

## 2023-10-31 DIAGNOSIS — J189 Pneumonia, unspecified organism: Secondary | ICD-10-CM | POA: Diagnosis not present

## 2023-10-31 LAB — GLUCOSE, CAPILLARY
Glucose-Capillary: 103 mg/dL — ABNORMAL HIGH (ref 70–99)
Glucose-Capillary: 113 mg/dL — ABNORMAL HIGH (ref 70–99)
Glucose-Capillary: 126 mg/dL — ABNORMAL HIGH (ref 70–99)
Glucose-Capillary: 144 mg/dL — ABNORMAL HIGH (ref 70–99)
Glucose-Capillary: 144 mg/dL — ABNORMAL HIGH (ref 70–99)
Glucose-Capillary: 154 mg/dL — ABNORMAL HIGH (ref 70–99)
Glucose-Capillary: 82 mg/dL (ref 70–99)

## 2023-10-31 NOTE — Plan of Care (Signed)
  Problem: Clinical Measurements: Goal: Ability to maintain clinical measurements within normal limits will improve Outcome: Progressing Goal: Will remain free from infection Outcome: Progressing Goal: Respiratory complications will improve Outcome: Progressing   Problem: Activity: Goal: Risk for activity intolerance will decrease Outcome: Progressing   Problem: Pain Managment: Goal: General experience of comfort will improve and/or be controlled Outcome: Progressing   Problem: Education: Goal: Knowledge of disease or condition will improve Outcome: Progressing

## 2023-10-31 NOTE — Progress Notes (Signed)
 10/31/2023     I have seen and evaluated the patient for trach f/u   S:  No events, watching tv, PMV in place.   O: Blood pressure 96/66, pulse 64, temperature 97.6 F (36.4 C), temperature source Oral, resp. rate 17, height 5\' 3"  (1.6 m), weight 81.6 kg, SpO2 94 %.    No distress Good cough Strong phonation Lungs clear   A:  Aspiration PNA, CVA s/p trach improving   P:  Switch to 4-0 cuffless and cap Tentative decannulation Wednesday if does okay with this and ENT agrees    Ardelle Kos MD [facility] Prefer epic messenger for cross cover needs If after hours, please call E-link

## 2023-10-31 NOTE — Progress Notes (Signed)
 PROGRESS NOTE   Jeremiah Stevens  ZOX:[id number]    DOB: Aug 31, 1975    DOA: 09/26/2023  PCP: [name], MD   I have briefly reviewed patients previous medical records in [facility].  Chief Complaint  Patient presents with   Dysphagia    Brief Hospital Course:   48 y.o. adult who with medical history including but not limited to DM2, seizures, HIV AIDS untreated, cryptococcal disease, obesity, polysubstance abuse, prior CVA. She was admitted 09/26/23 with dysphagia x 3 days along with headaches and drooling. He was found to be hypoxic to 80s on room air. CTA was negative for PE but showed multifocal PNA. He was started on abx and BD's. Biktarvy  was continued and ID was consulted. 3/18 He had right facial droop, right sided weakness, dysarthria. Neurology was consulted and recommended CTA and MRI. MRI demonstrated acute right medullary infarct. Despite ongoing dysphagia (MBS showed moderately severe pharyngeal phase dysphagia and imaging showed unilateral bulging on right), pt insisted on eating. He reportedly ordered pizza overnight 3/18 and certainly aspirated. He had threatened to leave AMA despite stroke workup ongoing. 3/19, he had hypoxia for which rapid response was called. While being assess, he continued to desaturate down into the 60s and then 40s. He required BVM and was transferred to the ICU for further evaluation and management. Upon arrival in the ICU, she had ongoing desaturations and bradycardia before developing PEA arrest. He had 9 minutes ACLS prior to ROSC including epi x 4 and bicarb x 2. During intubation attempt, she had copious food material and emesis in her oropharynx. Visualization of vocal cords was difficult due to amount of food material and emesis despite suctioning. On 2nd attempt, ETT was passed successfully and saturations improved shortly thereafter. There was grand mal seizure activity noted after intubation that abated after Midazolam   administration.  Significant hospital events:  3/16 admit 3/18 neuro consult, found to have acute CVA 3/19 Ongoing aspiration despite obvious dysphagia, was planning to leave AMA but sister talked pt into staying. then had a massive aspiration event leading to worse hypoxia and PEA arrest. Txf to [facility] 3/20 PSV cumulative total of 5-6 hrs, following some commands/ purposefully 3/21 self extubated, was reintubated 3/23 Failed SBT due to WOB 3/24 Failed SBT due to apnea 3/25 Tolerated PS >4 hours 3/26: Intubated. 3/31: S/p tracheostomy with ENT. 4/4: trach collar starting 1200  4/5 cont on trach collar  4/7: Care transferred from [facility] to [facility] 4/8: Modified barium swallow.  Failed.  NPO. 4/19: PEG tube insertion by IR   Assessment & Plan:   Acute hypoxic resp failure - S/p trach - Multifocal Aspiration PNA - Aspiration event- Hx tobacco use - Patient failed multiple events of extubation and SBT, eventually had tracheostomy done by ENT 3/31. - Currently weaned off the vent, remains trach dependent. -Tracheostomy management per PCCM and ENT Dr. Donalee Fruits.   ENT Dr. Donalee Fruits managing tracheostomy, trach was changed to #6 cuffless on 10/25/2023  -GPC on trach asp 10/12/23, on cefepime  #7/7 -SLP following for dysphagia and PMV valve use. -added CPT  -Unfortunately does not have LTAC benefits - Trach management per PCCM, plan to downsize to 4 cuffless and, with possible decannulation on Wednesday if okay with ENT  Dysphagia.  - Failed modified barium swallow, IR consulted for PEG tube insertion, PEG inserted by IR 4/18 after holding Plavix  x 5 days, patient is back on Plavix . - Continue with tube feed.  AKI -Creatinine back up today to 1.47,  avoid hypotension, so I will discontinue Norvasc  and decrease beta-blockers to 50 cc twice daily to avoid hypotension, continue with IV fluids.  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 -ASA, statin, seen by neurology this admission.   - Resume Plavix  tomorrow if ok by IR  Chest pain Elevated D-dimers -Patient with intermittent chest pain overnight , EKG is nonacute, troponins negative x 2, as well elevated, VQ scan is nondiagnostic for PE, but as well cannot rule out given left base perfusion deficiency, so proceeded with CTA, was negative for PE, as well venous Dopplers negative for DVT. [name]   Acute encephalopathy Anxiety, depression  -minimize CNS depressing meds -delirium precautions  -Encephalopathy has resolved.   PEA arrest  -?Sz like activity following, stable echocardiogram with preserved EF no wall motion abnormality. P -supportive care    HFpEF EF 65% HLD -statin, along with beta-blocker   Hypretension. -  Blood pressure is high.  Medications adjusted, increased beta-blocker. - discontinue Cardizem  and switch to Norvasc .  (Will decrease Norvasc  to avoid low blood pressure reading) - As needed hydralazine  and monitor.    Normocytic anemia -Follow intermittent CBC.  Hemoglobin stable in the 8-9 g range.   HIV / AIDS Hx +cryptococcal antigen  P -Seen by ID currently on following regimen per ID, post discharge follow-up with ID in the office.  Previously untreated.  Counseled on compliance.  -Continue p.o. fluconazole  400 mg daily on dischare QTc 385 -Continue Descovy /Tivicay   -Continue atovaquone  for PJP prophylaxis  DM2, with hyperglycemia, on steroids -cont basal + SSI.  Dosage adjusted on 10/24/2023 as patient was getting transiently hypoglycemic. - Patient is with significant hypoglycemic readings she is on significant dose insulin , I will decrease significantly and allow some elevated readings specially she will be n.p.o. in couple days for anticipated PEG. - Will hold Lantus  this evening and tomorrow morning as will be n.p.o. for PEG tomorrow)  CBG (last 3)  Recent Labs    10/31/23 0331 10/31/23 0816 10/31/23 1126  GLUCAP  154* 103* 82   Lab Results  Component Value Date   HGBA1C 10.8 (H) 09/29/2023    Code status  - Full  Medical noncompliance.   As per chart review and detailed discussion with overnight ICU RN, patient has medical decision making capacity but is very noncompliant-pulled out feeding tube, attempts to pull out tracheostomy, sickly counseled on being compliant, explained the risk of sudden aspiration, respiratory arrest, death.  Patient swallowed a whole cup of water  against advice on 10/24/2023, warned again of consequences being aspiration, pneumonia, sudden death.  She completely understands it and says will not do it again.    Body mass index is 33.13 kg/m.  Obesity.  Follow with PCP.    Nutritional Status Nutrition Problem: Increased nutrient needs Etiology: chronic illness Signs/Symptoms: estimated needs Interventions: Tube feeding, Prostat   DVT prophylaxis: SCDs Start: 09/27/23 0018.  Lovenox    Code Status: Full Code:  Family Communication: None at bedside today.  Father Archie Bearded  [phone] on 10/21/23   Sister Korie [phone] on 10/25/2023 at 7:53 AM message left  Disposition:  Status is: Inpatient  Consultants:   ENT Psychiatry PCCM Neurology Infectious disease  Subjective:  No significant events overnight,  denies any complaints today. Objective:   Vitals:   10/31/23 0500 10/31/23 0751 10/31/23 0812 10/31/23 1151  BP:  (!) 112/97    Pulse:  84 88 82  Resp:  (!) 21  12 20  Temp:  98 F (36.7 C)    TempSrc:  Oral    SpO2:  96% 95% 98%  Weight: 110.8 kg     Height:        Awake Alert, No new F.N deficits,  tracheostomy site stable with trach collar, less secretion today  Symmetrical Chest wall movement, Good air movement bilaterally, CTAB RRR,No Gallops,Rubs or new Murmurs, No Parasternal Heave +ve B.Sounds, Abd Soft, PEG+ No Cyanosis, Clubbing or edema, No new Rash or bruise      Data Reviewed:   I have personally reviewed following labs and  imaging studies    Data Review:   Inpatient Medications  Scheduled Meds:  aspirin   81 mg Per Tube Daily   atorvastatin   20 mg Per Tube QHS   atovaquone   1,500 mg Per Tube Q lunch   Chlorhexidine  Gluconate Cloth  6 each Topical Daily   clopidogrel   75 mg Per Tube Daily   dolutegravir   50 mg Per Tube Daily   emtricitabine -tenofovir   1 tablet Per Tube Daily   enoxaparin  (LOVENOX ) injection  50 mg Subcutaneous Q24H   famotidine   20 mg Per Tube BID   feeding supplement (PROSource TF20)  60 mL Per Tube Daily   fiber supplement (BANATROL TF)  60 mL Per Tube BID   fluconazole   400 mg Per Tube Daily   free water   100 mL Per Tube Q4H   Gerhardt's butt cream   Topical BID   insulin  aspart  0-20 Units Subcutaneous Q4H   insulin  aspart  8 Units Subcutaneous 4 times per day   insulin  glargine-yfgn  15 Units Subcutaneous BID   metoprolol  tartrate  50 mg Per Tube BID   multivitamin with minerals  1 tablet Per Tube Daily   nicotine   14 mg Transdermal Daily   PARoxetine   20 mg Per Tube Daily   QUEtiapine   50 mg Per Tube QHS   Continuous Infusions:  sodium chloride  Stopped (10/29/23 0744)   dextrose  Stopped (10/23/23 0856)   feeding supplement (OSMOLITE 1.5 CAL) 1,000 mL (10/31/23 1337)   PRN Meds:.acetaminophen  **OR** acetaminophen , alum & mag hydroxide-simeth, bisacodyl , dextrose , fentaNYL  (SUBLIMAZE ) injection, guaiFENesin -dextromethorphan , hydrALAZINE , hydrOXYzine , iohexol , labetalol , levalbuterol , loperamide  HCl, melatonin, ondansetron  **OR** ondansetron  (ZOFRAN ) IV, mouth rinse, oxyCODONE , polyethylene glycol, polyvinyl alcohol   Recent Labs  Lab 10/26/23 0317 10/27/23 0315 10/28/23 0318 10/29/23 0509 10/30/23 0512  WBC 4.7 4.3 5.5 5.4 4.6  HGB 9.9* 10.1* 10.4* 9.8* 10.1*  HCT 31.1* 31.6* 31.8* 30.2* 31.2*  PLT 420* 381 375 322 333  MCV 92.3 91.6 90.9 90.7 89.9  MCH 29.4 29.3 29.7 29.4 29.1  MCHC 31.8 32.0 32.7 32.5 32.4  RDW 15.5 15.6* 15.8* 15.7* 15.6*  LYMPHSABS 1.3 1.1 1.3  1.4 1.4  MONOABS 0.7 0.7 0.9 0.8 0.8  EOSABS 0.1 0.2 0.2 0.1 0.2  BASOSABS 0.0 0.0 0.0 0.0 0.0    Recent Labs  Lab 10/26/23 0317 10/27/23 0315 10/28/23 0318 10/29/23 0509 10/30/23 0512  NA 134* 134* 133* 134* 132*  K 4.4 4.2 4.5 4.4 3.9  CL 96* 97* 97* 101 96*  CO2 30 28 26 26 25   ANIONGAP 8 9 10 7 11   GLUCOSE 146* 105* 137* 96 141*  BUN 21* 20 24* 21* 14  CREATININE 1.47* 1.33* 1.48* 1.46* 1.30*  DDIMER  --   --  3.08*  --   --   INR  --   --   --  1.1  --   MG 2.4  2.3 2.4 2.2  --   PHOS 2.7 3.4 2.8 2.2*  --   CALCIUM 9.4 9.0 9.1 9.0 9.0      Recent Labs  Lab 10/26/23 0317 10/27/23 0315 10/28/23 0318 10/29/23 0509 10/30/23 0512  DDIMER  --   --  3.08*  --   --   INR  --   --   --  1.1  --   MG 2.4 2.3 2.4 2.2  --   CALCIUM 9.4 9.0 9.1 9.0 9.0    --------------------------------------------------------------------------------------------------------------- Lab Results  Component Value Date   CHOL 161 09/29/2023   HDL 28 (L) 09/29/2023   LDLCALC 95 09/29/2023   TRIG 219 (H) 09/30/2023   CHOLHDL 5.8 09/29/2023    Lab Results  Component Value Date   HGBA1C 10.8 (H) 09/29/2023     Micro Results No results found for this or any previous visit (from the past 240 hours).   Radiology Reports  No results found.  Signature  -   Seena Dadds M.D on 10/31/2023 at 2:41 PM   -  To page go to [email or web address]

## 2023-10-31 NOTE — Progress Notes (Signed)
 Dr. Felipe Horton placed order to downsize trach to 4 cuffless and cap.  RT contact provider and asked if it would be okay to just to cap size 6 trach that is currently in place.  Dr. Felipe Horton was okay with this, as long as pt was able to tolerate.  Patient capped and is tolerating well at this time, RT will continue to monitor. Per critical care note from today, plan is for tentative decannulation Wed, if patient does well with capping and ENT agrees.

## 2023-10-31 NOTE — Plan of Care (Signed)
  Problem: Clinical Measurements: Goal: Ability to maintain clinical measurements within normal limits will improve Outcome: Progressing Goal: Will remain free from infection Outcome: Progressing Goal: Respiratory complications will improve Outcome: Progressing   Problem: Activity: Goal: Risk for activity intolerance will decrease Outcome: Progressing   Problem: Pain Managment: Goal: General experience of comfort will improve and/or be controlled Outcome: Progressing   Problem: Education: Goal: Knowledge of disease or condition will improve Outcome: Progressing Goal: Knowledge of secondary prevention will improve (MUST DOCUMENT ALL) Outcome: Progressing

## 2023-10-31 NOTE — Plan of Care (Signed)
 Problem: Health Behavior/Discharge Planning: Goal: Ability to identify and utilize available resources and services will improve 10/31/2023 0813 by Diamond Formica, RN Outcome: Progressing 10/31/2023 0813 by Diamond Formica, RN Outcome: Progressing Goal: Ability to manage health-related needs will improve 10/31/2023 0813 by Diamond Formica, RN Outcome: Progressing 10/31/2023 0813 by Diamond Formica, RN Outcome: Progressing   Problem: Metabolic: Goal: Ability to maintain appropriate glucose levels will improve 10/31/2023 0813 by Diamond Formica, RN Outcome: Progressing 10/31/2023 0813 by Diamond Formica, RN Outcome: Progressing   Problem: Nutritional: Goal: Maintenance of adequate nutrition will improve 10/31/2023 0813 by Diamond Formica, RN Outcome: Progressing 10/31/2023 0813 by Diamond Formica, RN Outcome: Progressing Goal: Progress toward achieving an optimal weight will improve 10/31/2023 0813 by Diamond Formica, RN Outcome: Progressing 10/31/2023 0813 by Diamond Formica, RN Outcome: Progressing   Problem: Skin Integrity: Goal: Risk for impaired skin integrity will decrease 10/31/2023 0813 by Diamond Formica, RN Outcome: Progressing 10/31/2023 0813 by Diamond Formica, RN Outcome: Progressing   Problem: Education: Goal: Knowledge of General Education information will improve Description: Including pain rating scale, medication(s)/side effects and non-pharmacologic comfort measures 10/31/2023 0813 by Diamond Formica, RN Outcome: Progressing 10/31/2023 0813 by Diamond Formica, RN Outcome: Progressing   Problem: Health Behavior/Discharge Planning: Goal: Ability to manage health-related needs will improve 10/31/2023 0813 by Diamond Formica, RN Outcome: Progressing 10/31/2023 0813 by Diamond Formica, RN Outcome: Progressing   Problem: Clinical Measurements: Goal: Ability to maintain clinical measurements within normal limits will improve 10/31/2023 0813 by Diamond Formica, RN Outcome: Progressing 10/31/2023  0813 by Diamond Formica, RN Outcome: Progressing Goal: Will remain free from infection 10/31/2023 0813 by Diamond Formica, RN Outcome: Progressing 10/31/2023 0813 by Diamond Formica, RN Outcome: Progressing Goal: Diagnostic test results will improve 10/31/2023 0813 by Diamond Formica, RN Outcome: Progressing 10/31/2023 0813 by Diamond Formica, RN Outcome: Progressing Goal: Respiratory complications will improve 10/31/2023 0813 by Diamond Formica, RN Outcome: Progressing 10/31/2023 0813 by Diamond Formica, RN Outcome: Progressing Goal: Cardiovascular complication will be avoided 10/31/2023 0813 by Diamond Formica, RN Outcome: Progressing 10/31/2023 0813 by Diamond Formica, RN Outcome: Progressing   Problem: Nutrition: Goal: Adequate nutrition will be maintained 10/31/2023 0813 by Diamond Formica, RN Outcome: Progressing 10/31/2023 0813 by Diamond Formica, RN Outcome: Progressing   Problem: Coping: Goal: Level of anxiety will decrease 10/31/2023 0813 by Diamond Formica, RN Outcome: Progressing 10/31/2023 0813 by Diamond Formica, RN Outcome: Progressing   Problem: Elimination: Goal: Will not experience complications related to bowel motility 10/31/2023 0813 by Diamond Formica, RN Outcome: Progressing 10/31/2023 0813 by Diamond Formica, RN Outcome: Progressing Goal: Will not experience complications related to urinary retention 10/31/2023 0813 by Diamond Formica, RN Outcome: Progressing 10/31/2023 0813 by Diamond Formica, RN Outcome: Progressing   Problem: Safety: Goal: Ability to remain free from injury will improve 10/31/2023 0813 by Diamond Formica, RN Outcome: Progressing 10/31/2023 0813 by Diamond Formica, RN Outcome: Progressing   Problem: Skin Integrity: Goal: Risk for impaired skin integrity will decrease 10/31/2023 0813 by Diamond Formica, RN Outcome: Progressing 10/31/2023 0813 by Diamond Formica, RN Outcome: Progressing   Problem: Activity: Goal: Ability to tolerate increased activity will  improve 10/31/2023 0813 by Diamond Formica, RN Outcome: Progressing 10/31/2023 0813 by Diamond Formica, RN Outcome: Progressing   Problem: Respiratory: Goal: Ability to maintain adequate ventilation will improve 10/31/2023 0813 by Diamond Formica, RN Outcome: Progressing 10/31/2023 0813 by Diamond Formica, RN Outcome: Progressing Goal: Ability to maintain a clear airway will improve 10/31/2023 0813 by Diamond Formica, RN Outcome: Progressing 10/31/2023 0813 by Diamond Formica, RN Outcome:  Progressing   Problem: Education: Goal: Knowledge of disease or condition will improve 10/31/2023 0813 by Diamond Formica, RN Outcome: Progressing 10/31/2023 0813 by Diamond Formica, RN Outcome: Progressing Goal: Knowledge of secondary prevention will improve (MUST DOCUMENT ALL) 10/31/2023 0813 by Diamond Formica, RN Outcome: Progressing 10/31/2023 0813 by Diamond Formica, RN Outcome: Progressing Goal: Knowledge of patient specific risk factors will improve (DELETE if not current risk factor) 10/31/2023 0813 by Diamond Formica, RN Outcome: Progressing 10/31/2023 0813 by Diamond Formica, RN Outcome: Progressing   Problem: Ischemic Stroke/TIA Tissue Perfusion: Goal: Complications of ischemic stroke/TIA will be minimized 10/31/2023 0813 by Diamond Formica, RN Outcome: Progressing 10/31/2023 0813 by Diamond Formica, RN Outcome: Progressing   Problem: Coping: Goal: Will verbalize positive feelings about self 10/31/2023 0813 by Diamond Formica, RN Outcome: Progressing 10/31/2023 0813 by Diamond Formica, RN Outcome: Progressing Goal: Will identify appropriate support needs 10/31/2023 0813 by Diamond Formica, RN Outcome: Progressing 10/31/2023 0813 by Diamond Formica, RN Outcome: Progressing

## 2023-11-01 ENCOUNTER — Inpatient Hospital Stay (HOSPITAL_COMMUNITY)

## 2023-11-01 DIAGNOSIS — J189 Pneumonia, unspecified organism: Secondary | ICD-10-CM | POA: Diagnosis not present

## 2023-11-01 LAB — GLUCOSE, CAPILLARY
Glucose-Capillary: 120 mg/dL — ABNORMAL HIGH (ref 70–99)
Glucose-Capillary: 143 mg/dL — ABNORMAL HIGH (ref 70–99)
Glucose-Capillary: 149 mg/dL — ABNORMAL HIGH (ref 70–99)
Glucose-Capillary: 149 mg/dL — ABNORMAL HIGH (ref 70–99)
Glucose-Capillary: 73 mg/dL (ref 70–99)
Glucose-Capillary: 85 mg/dL (ref 70–99)

## 2023-11-01 MED ORDER — PANTOPRAZOLE SODIUM 40 MG IV SOLR
40.0000 mg | Freq: Every day | INTRAVENOUS | Status: DC
  Administered 2023-11-01 – 2023-11-09 (×10): 40 mg via INTRAVENOUS
  Filled 2023-11-01 (×10): qty 10

## 2023-11-01 MED ORDER — METOCLOPRAMIDE HCL 5 MG/ML IJ SOLN
10.0000 mg | Freq: Once | INTRAMUSCULAR | Status: AC
  Administered 2023-11-01: 10 mg via INTRAVENOUS
  Filled 2023-11-01: qty 2

## 2023-11-01 MED ORDER — BACLOFEN 10 MG PO TABS
10.0000 mg | ORAL_TABLET | Freq: Once | ORAL | Status: AC
  Administered 2023-11-01: 10 mg
  Filled 2023-11-01: qty 1

## 2023-11-01 NOTE — Progress Notes (Addendum)
 [facility] night cross cover note:   I was notified by RN that the patient is complaining of intractable hiccups as well as having experienced an episode of nausea, vomiting.  To help address both the intractable hiccups as well as the nausea, I have ordered Reglan 10 mg IV x 1 dose now.   Update: Patient's intractable hiccups persist following the above dose of IV Reglan.  I subsequently ordered Protonix 40 mg IV daily, with first dose now as well as a one-time dose of baclofen 10 mg per tube x 1 dose now to try to further address her intractable hiccups.     Camelia Cavalier, DO Hospitalist

## 2023-11-01 NOTE — Plan of Care (Signed)
  Problem: Clinical Measurements: Goal: Ability to maintain clinical measurements within normal limits will improve Outcome: Progressing Goal: Respiratory complications will improve Outcome: Progressing   Problem: Activity: Goal: Risk for activity intolerance will decrease Outcome: Progressing   Problem: Coping: Goal: Level of anxiety will decrease Outcome: Progressing   Problem: Education: Goal: Knowledge of disease or condition will improve Outcome: Progressing Goal: Knowledge of secondary prevention will improve (MUST DOCUMENT ALL) Outcome: Progressing

## 2023-11-01 NOTE — Progress Notes (Signed)
 PROGRESS NOTE   Jeremiah Stevens  ZOX:[id number]    DOB: 08-Feb-1976    DOA: 09/26/2023  PCP: [name], MD   I have briefly reviewed patients previous medical records in [facility].  Chief Complaint  Patient presents with   Dysphagia    Brief Hospital Course:   48 y.o. adult who with medical history including but not limited to DM2, seizures, HIV AIDS untreated, cryptococcal disease, obesity, polysubstance abuse, prior CVA. She was admitted 09/26/23 with dysphagia x 3 days along with headaches and drooling. He was found to be hypoxic to 80s on room air. CTA was negative for PE but showed multifocal PNA. He was started on abx and BD's. Biktarvy  was continued and ID was consulted. 3/18 He had right facial droop, right sided weakness, dysarthria. Neurology was consulted and recommended CTA and MRI. MRI demonstrated acute right medullary infarct. Despite ongoing dysphagia (MBS showed moderately severe pharyngeal phase dysphagia and imaging showed unilateral bulging on right), pt insisted on eating. He reportedly ordered pizza overnight 3/18 and certainly aspirated. He had threatened to leave AMA despite stroke workup ongoing. 3/19, he had hypoxia for which rapid response was called. While being assess, he continued to desaturate down into the 60s and then 40s. He required BVM and was transferred to the ICU for further evaluation and management. Upon arrival in the ICU, she had ongoing desaturations and bradycardia before developing PEA arrest. He had 9 minutes ACLS prior to ROSC including epi x 4 and bicarb x 2. During intubation attempt, she had copious food material and emesis in her oropharynx. Visualization of vocal cords was difficult due to amount of food material and emesis despite suctioning. On 2nd attempt, ETT was passed successfully and saturations improved shortly thereafter. There was grand mal seizure activity noted after intubation that abated after Midazolam   administration.  Significant hospital events:  3/16 admit 3/18 neuro consult, found to have acute CVA 3/19 Ongoing aspiration despite obvious dysphagia, was planning to leave AMA but sister talked pt into staying. then had a massive aspiration event leading to worse hypoxia and PEA arrest. Txf to [facility] 3/20 PSV cumulative total of 5-6 hrs, following some commands/ purposefully 3/21 self extubated, was reintubated 3/23 Failed SBT due to WOB 3/24 Failed SBT due to apnea 3/25 Tolerated PS >4 hours 3/26: Intubated. 3/31: S/p tracheostomy with ENT. 4/4: trach collar starting 1200  4/5 cont on trach collar  4/7: Care transferred from [facility] to [facility] 4/8: Modified barium swallow.  Failed.  NPO. 4/19: PEG tube insertion by IR   Assessment & Plan:   Acute hypoxic resp failure - S/p trach - Multifocal Aspiration PNA - Aspiration event- Hx tobacco use - Patient failed multiple events of extubation and SBT, eventually had tracheostomy done by ENT 3/31. - Currently weaned off the vent, remains trach dependent. -Tracheostomy management per PCCM and ENT Dr. Donalee Fruits.   ENT Dr. Donalee Fruits managing tracheostomy, trach was changed to #6 cuffless on 10/25/2023  -GPC on trach asp 10/12/23, on cefepime  #7/7 -SLP following for dysphagia and PMV valve use. -added CPT  -Unfortunately does not have LTAC benefits - Trach management per PCCM,  downsize to 4 cuffless and, with possible decannulation on Wednesday if okay with ENT  Dysphagia.  - Failed modified barium swallow, IR consulted for PEG tube insertion, PEG inserted by IR 4/18 after holding Plavix  x 5 days, patient is back on Plavix . - Continue with tube feed.  AKI -Creatinine back up today to 1.47, avoid  hypotension, so I will discontinue Norvasc  and decrease beta-blockers to 50 cc twice daily to avoid hypotension, continue with IV fluids.  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 -ASA,  statin, seen by neurology this admission.   - Resume Plavix  tomorrow if ok by IR  Chest pain Elevated D-dimers -Patient with intermittent chest pain overnight , EKG is nonacute, troponins negative x 2, as well elevated, VQ scan is nondiagnostic for PE, but as well cannot rule out given left base perfusion deficiency, so proceeded with CTA, was negative for PE, as well venous Dopplers negative for DVT. [name]   Acute encephalopathy Anxiety, depression  -minimize CNS depressing meds -delirium precautions  -Encephalopathy has resolved.   PEA arrest  -?Sz like activity following, stable echocardiogram with preserved EF no wall motion abnormality. P -supportive care    HFpEF EF 65% HLD -statin, along with beta-blocker   Hypretension. -  Blood pressure is high.  Medications adjusted, increased beta-blocker. - discontinue Cardizem  and switch to Norvasc .  (Will decrease Norvasc  to avoid low blood pressure reading) - As needed hydralazine  and monitor.    Normocytic anemia -Follow intermittent CBC.  Hemoglobin stable in the 8-9 g range.   HIV / AIDS Hx +cryptococcal antigen  P -Seen by ID currently on following regimen per ID, post discharge follow-up with ID in the office.  Previously untreated.  Counseled on compliance.  -Continue p.o. fluconazole  400 mg daily on dischare QTc 385 -Continue Descovy /Tivicay   -Continue atovaquone  for PJP prophylaxis  DM2, with hyperglycemia, on steroids -cont basal + SSI.  Dosage adjusted on 10/24/2023 as patient was getting transiently hypoglycemic. - Patient is with significant hypoglycemic readings she is on significant dose insulin , I will decrease significantly and allow some elevated readings specially she will be n.p.o. in couple days for anticipated PEG. - Will hold Lantus  this evening and tomorrow morning as will be n.p.o. for PEG tomorrow)  CBG (last 3)  Recent Labs    11/01/23 0436 11/01/23 0818 11/01/23 1333  GLUCAP 149* 149* 85   Lab  Results  Component Value Date   HGBA1C 10.8 (H) 09/29/2023    Code status  - Full  Medical noncompliance.   As per chart review and detailed discussion with overnight ICU RN, patient has medical decision making capacity but is very noncompliant-pulled out feeding tube, attempts to pull out tracheostomy, sickly counseled on being compliant, explained the risk of sudden aspiration, respiratory arrest, death.  Patient swallowed a whole cup of water  against advice on 10/24/2023, warned again of consequences being aspiration, pneumonia, sudden death.  She completely understands it and says will not do it again.    Body mass index is 33.61 kg/m.  Obesity.  Follow with PCP.    Nutritional Status Nutrition Problem: Increased nutrient needs Etiology: chronic illness Signs/Symptoms: estimated needs Interventions: Tube feeding, Prostat   DVT prophylaxis: SCDs Start: 09/27/23 0018.  Lovenox    Code Status: Full Code:  Family Communication: None at bedside today.  Father Archie Bearded  [phone] on 10/21/23   Sister Korie [phone] on 10/25/2023 at 7:53 AM message left  Disposition:  Status is: Inpatient  Consultants:   ENT Psychiatry PCCM Neurology Infectious disease  Subjective:  Hiccups overnight, this has resolved, had some nausea and 1 episode of vomiting, but so far tolerating tube feed, plan for MBS today, tolerating To downsize trach Objective:   Vitals:   11/01/23 0614 11/01/23 0839 11/01/23 1108 11/01/23 1505  BP:      Pulse: 88 92 85 94  Resp: (!) 22 14 20  (!) 24  Temp:      TempSrc:      SpO2: 92% 98% 99% 92%  Weight:      Height:        Awake Alert, No new F.N deficits,  tracheostomy site stable with trach collar, capped, no secretions today  Good air entry bilaterally Abdominal binder due to PEG Extremities with no edema.    Data Reviewed:   I have personally reviewed following labs and imaging studies    Data Review:   Inpatient Medications  Scheduled  Meds:  aspirin   81 mg Per Tube Daily   atorvastatin   20 mg Per Tube QHS   atovaquone   1,500 mg Per Tube Q lunch   Chlorhexidine  Gluconate Cloth  6 each Topical Daily   clopidogrel   75 mg Per Tube Daily   dolutegravir   50 mg Per Tube Daily   emtricitabine -tenofovir   1 tablet Per Tube Daily   enoxaparin  (LOVENOX ) injection  50 mg Subcutaneous Q24H   famotidine   20 mg Per Tube BID   feeding supplement (PROSource TF20)  60 mL Per Tube Daily   fiber supplement (BANATROL TF)  60 mL Per Tube BID   fluconazole   400 mg Per Tube Daily   free water   100 mL Per Tube Q4H   Gerhardt's butt cream   Topical BID   insulin  aspart  0-20 Units Subcutaneous Q4H   insulin  aspart  8 Units Subcutaneous 4 times per day   insulin  glargine-yfgn  15 Units Subcutaneous BID   metoprolol  tartrate  50 mg Per Tube BID   multivitamin with minerals  1 tablet Per Tube Daily   nicotine   14 mg Transdermal Daily   pantoprazole  (PROTONIX ) IV  40 mg Intravenous QHS   PARoxetine   20 mg Per Tube Daily   QUEtiapine   50 mg Per Tube QHS   Continuous Infusions:  dextrose  Stopped (10/23/23 0856)   feeding supplement (OSMOLITE 1.5 CAL) 1,000 mL (11/01/23 0306)   PRN Meds:.acetaminophen  **OR** acetaminophen , alum & mag hydroxide-simeth, bisacodyl , dextrose , fentaNYL  (SUBLIMAZE ) injection, guaiFENesin -dextromethorphan , hydrALAZINE , hydrOXYzine , iohexol , labetalol , levalbuterol , loperamide  HCl, melatonin, ondansetron  **OR** ondansetron  (ZOFRAN ) IV, mouth rinse, oxyCODONE , polyethylene glycol, polyvinyl alcohol   Recent Labs  Lab 10/26/23 0317 10/27/23 0315 10/28/23 0318 10/29/23 0509 10/30/23 0512  WBC 4.7 4.3 5.5 5.4 4.6  HGB 9.9* 10.1* 10.4* 9.8* 10.1*  HCT 31.1* 31.6* 31.8* 30.2* 31.2*  PLT 420* 381 375 322 333  MCV 92.3 91.6 90.9 90.7 89.9  MCH 29.4 29.3 29.7 29.4 29.1  MCHC 31.8 32.0 32.7 32.5 32.4  RDW 15.5 15.6* 15.8* 15.7* 15.6*  LYMPHSABS 1.3 1.1 1.3 1.4 1.4  MONOABS 0.7 0.7 0.9 0.8 0.8  EOSABS 0.1 0.2 0.2 0.1  0.2  BASOSABS 0.0 0.0 0.0 0.0 0.0    Recent Labs  Lab 10/26/23 0317 10/27/23 0315 10/28/23 0318 10/29/23 0509 10/30/23 0512  NA 134* 134* 133* 134* 132*  K 4.4 4.2 4.5 4.4 3.9  CL 96* 97* 97* 101 96*  CO2 30 28 26 26 25   ANIONGAP 8 9 10 7 11   GLUCOSE 146* 105* 137* 96 141*  BUN 21* 20 24* 21* 14  CREATININE 1.47* 1.33* 1.48* 1.46* 1.30*  DDIMER  --   --  3.08*  --   --   INR  --   --   --  1.1  --   MG 2.4 2.3 2.4 2.2  --  PHOS 2.7 3.4 2.8 2.2*  --   CALCIUM 9.4 9.0 9.1 9.0 9.0      Recent Labs  Lab 10/26/23 0317 10/27/23 0315 10/28/23 0318 10/29/23 0509 10/30/23 0512  DDIMER  --   --  3.08*  --   --   INR  --   --   --  1.1  --   MG 2.4 2.3 2.4 2.2  --   CALCIUM 9.4 9.0 9.1 9.0 9.0    --------------------------------------------------------------------------------------------------------------- Lab Results  Component Value Date   CHOL 161 09/29/2023   HDL 28 (L) 09/29/2023   LDLCALC 95 09/29/2023   TRIG 219 (H) 09/30/2023   CHOLHDL 5.8 09/29/2023    Lab Results  Component Value Date   HGBA1C 10.8 (H) 09/29/2023     Micro Results No results found for this or any previous visit (from the past 240 hours).   Radiology Reports  DG Swallowing Func-Speech Pathology Result Date: 11/01/2023 Table formatting from the original result was not included. Modified Barium Swallow Study Patient Details Name: Jeremiah Stevens MRN: [id number] Date of Birth: 01-09-1976 Today's Date: 11/01/2023 HPI/PMH: HPI: Jeremiah Stevens is a 48 y.o. adult who identifies as male, admitted 09/26/23 with dysphagia x 3 days along with headaches and drooling. She was found to be hypoxic to 80s on room air. CTA showed multifocal PNA.  3/18 she had right facial droop, right sided weakness, dysarthria. MRI demonstrated acute right medullary infarct. Despite ongoing dysphagia (MBS showed moderately severe pharyngeal phase dysphagia and imaging showed unilateral bulging on right), pt  insisted on eating. She reportedly ordered pizza overnight 3/18 and certainly aspirated, desaturated, ultimately suffered PEA arrest, 9 minutes ACLS, copious food material and emesis in oropharynx. Self extubated 3/21, reintubated and extubated 3/26, Trach on 3/31, ATC on 4/4. Clinical Impression: Pt demosntrates improvement in swallow function; now achieving a DIGEST severity score of 3, improved from 4 on prior exam. Pt now has increased instances of epiglottic deflection, hyoid excursion and PES opening, though duration of movement needs improvement. If swallowing small sips of thin liquids, utilizing super-supraglottic and mendelsohn strategies pt is able to propel small sips of thin liquids to esophagus with trace penetration/aspiration that pt preventatively ejects. Cues were given with video biofeedback to reinforce success with pt. Verbal cues included "take a small sip, hold it in your mouth, hold your breath. Now swallow hard and long, then clear your throat and swallow again." Jeremiah Stevens was quite successful with these maneuvers. Purees and soft solids were more difficult, given increased pharyngeal residue, but a liquid wash with similar cues helped to transit bolus. After further training with SLP, Zaylyn will be ready to try a full liquid diet. There is risk of aspiration and noncompliance but overall Jia has been tolerating some aspiration for the sake of therapy and achieving improvement, so we will continue efforts.  DIGEST Swallow Severity Rating*             Safety:             Efficiency:             Overall Pharyngeal Swallow Severity: 1: mild; 2: moderate; 3: severe; 4: profound *The Dynamic Imaging Grade of Swallowing Toxicity is standardized for the head and neck cancer population, however, demonstrates promising clinical applications across populations to standardize the clinical rating of pharyngeal swallow safety and severity. Factors that may increase risk of adverse event in presence of  aspiration Jeremiah Stevens & Jessy Morocco 2021): No data recorded  Recommendations/Plan: Swallowing Evaluation Recommendations Swallowing Evaluation Recommendations Liquid Administration via: Cup; No straw Medication Administration: Via alternative means Supervision: Patient able to self-feed Swallowing strategies  : Slow rate; Small bites/sips; Follow solids with liquids; Multiple dry swallows after each bite/sip; effortful swallow; Clear throat intermittently; Hold breath during swallow, followed by a cough (super supraglottic swallow); Swallow hold (Mendelsohn maneuver) Oral care recommendations: Oral care BID (2x/day) Treatment Plan Treatment Plan Treatment recommendations: Therapy as outlined in treatment plan below Follow-up recommendations: Follow physicians's recommendations for discharge plan and follow up therapies Functional status assessment: Patient has had a recent decline in their functional status and demonstrates the ability to make significant improvements in function in a reasonable and predictable amount of time. Treatment frequency: Min 3x/week Treatment duration: 2 weeks Interventions: Aspiration precaution training; Compensatory techniques; Patient/family education; Trials of upgraded texture/liquids Recommendations Recommendations for follow up therapy are one component of a multi-disciplinary discharge planning process, led by the attending physician.  Recommendations may be updated based on patient status, additional functional criteria and insurance authorization. Assessment: Orofacial Exam: No data recorded Anatomy: Anatomy: WFL Boluses Administered: Boluses Administered Boluses Administered: Thin liquids (Level 0); Mildly thick liquids (Level 2, nectar thick); Puree; Solid  Oral Impairment Domain: Oral Impairment Domain Lip Closure: No labial escape Tongue control during bolus hold: Posterior escape of less than half of bolus Bolus preparation/mastication: Timely and efficient chewing and mashing  Bolus transport/lingual motion: Brisk tongue motion Oral residue: Trace residue lining oral structures Initiation of pharyngeal swallow : Pyriform sinuses  Pharyngeal Impairment Domain: Pharyngeal Impairment Domain Soft palate elevation: Escape to nasopharynx Laryngeal elevation: Partial superior movement of thyroid cartilage/partial approximation of arytenoids to epiglottic petiole Anterior hyoid excursion: Partial anterior movement Epiglottic movement: Partial inversion Laryngeal vestibule closure: Incomplete, narrow column air/contrast in laryngeal vestibule Pharyngeal stripping wave : Present - diminished Pharyngeal contraction (A/P view only): N/A Pharyngoesophageal segment opening: Partial distention/partial duration, partial obstruction of flow Tongue base retraction: Trace column of contrast or air between tongue base and PPW Pharyngeal residue: Collection of residue within or on pharyngeal structures Location of pharyngeal residue: Diffuse (>3 areas)  Esophageal Impairment Domain: No data recorded Pill: No data recorded Penetration/Aspiration Scale Score: Penetration/Aspiration Scale Score 1.  Material does not enter airway: Solid; Puree 2.  Material enters airway, remains ABOVE vocal cords then ejected out: Mildly thick liquids (Level 2, nectar thick) 6.  Material enters airway, passes BELOW cords then ejected out: Thin liquids (Level 0) 8.  Material enters airway, passes BELOW cords without attempt by patient to eject out (silent aspiration) : Thin liquids (Level 0) Compensatory Strategies: Compensatory Strategies Compensatory strategies: Yes Straw: Ineffective Effortful swallow: Effective Effective Effortful Swallow: Thin liquid (Level 0); Puree; Solid Multiple swallows: Effective Effective Multiple Swallows: Thin liquid (Level 0); Puree; Solid Chin tuck: Ineffective Ineffective Chin Tuck: Puree Liquid wash: Effective Effective Liquid Wash: Thin liquid (Level 0) Super supraglottic swallow: Effective  Effective Super Supraglottic Swallow: Thin liquid (Level 0) Mendelsohn : Effective Effective Mendelsohn: Thin liquid (Level 0); Puree; Solid   General Information: Caregiver present: No  Diet Prior to this Study: NPO; G-tube   Temperature : Normal   Respiratory Status: WFL   Supplemental O2: None (Room air)   No data recorded Behavior/Cognition: Alert; Cooperative Self-Feeding Abilities: Able to self-feed Baseline vocal quality/speech: Other (comment) (wet) Volitional Cough: Able to elicit Volitional Swallow: Able to elicit No data recorded Goal Planning: Prognosis for improved oropharyngeal function: Good Barriers to Reach Goals: Severity of deficits No data recorded  Patient/Family Stated Goal: to eat Consulted and agree with results and recommendations: Patient Pain: No data recorded End of Session: Start Time:SLP Start Time (ACUTE ONLY): 1230 Stop Time: SLP Stop Time (ACUTE ONLY): 1300 Time Calculation:SLP Time Calculation (min) (ACUTE ONLY): 30 min Charges: SLP Evaluations $ SLP Speech Visit: 1 Visit SLP Evaluations $MBS Swallow: 1 Procedure $Swallowing Treatment: 1 Procedure SLP visit diagnosis: SLP Visit Diagnosis: Dysphagia, pharyngeal phase (R13.13) Past Medical History: Past Medical History: Diagnosis Date  Cancer (HCC)   seizures  Diabetes (HCC)   Diabetes mellitus without complication (HCC)   Heartburn   occasional; OTC as needed  Herpes genitalis in men   HIV (human immunodeficiency virus infection) (HCC)   HIV disease (HCC)   HTN (hypertension)   Hyperlipidemia   Hypertension   under control with med., has been on med. x 1 yr.  Lateral malleolar fracture 09/02/2013  left  Migraines   Tear of deltoid ligament of left ankle 09/02/2013  Type 2 diabetes mellitus with hyperosmolar nonketotic hyperglycemia (HCC) 02/10/2020 Past Surgical History: Past Surgical History: Procedure Laterality Date  IR GASTROSTOMY TUBE MOD SED  10/29/2023  NO PAST SURGERIES    ORIF ANKLE FRACTURE Left 09/13/2013  Procedure: OPEN  REDUCTION INTERNAL FIXATION (ORIF) LEFT LATERAL MALLEOLUS ANKLE FRACTURE ;  Surgeon: Christie Cox, MD;  Location: [facility];  Service: Orthopedics;  Laterality: Left;  TRACHEOSTOMY TUBE PLACEMENT N/A 10/11/2023  Procedure: CREATION, TRACHEOSTOMY;  Surgeon: Janita Mellow, MD;  Location: [facility] OR;  Service: ENT;  Laterality: N/A; DeBlois, Hardin Leys 11/01/2023, 1:27 PM   Signature  -   Seena Dadds M.D on 11/01/2023 at 3:21 PM   -  To page go to [email or web address]

## 2023-11-01 NOTE — Progress Notes (Signed)
 Modified Barium Swallow Study  Patient Details  Name: Jeremiah Stevens MRN: [id number] Date of Birth: Jun 04, 1976  Today's Date: 11/01/2023  Modified Barium Swallow completed.  Full report located under Chart Review in the Imaging Section.  History of Present Illness Jeremiah Stevens is a 48 y.o. adult who identifies as male, admitted 09/26/23 with dysphagia x 3 days along with headaches and drooling. She was found to be hypoxic to 80s on room air. CTA showed multifocal PNA.  3/18 she had right facial droop, right sided weakness, dysarthria. MRI demonstrated acute right medullary infarct. Despite ongoing dysphagia (MBS showed moderately severe pharyngeal phase dysphagia and imaging showed unilateral bulging on right), pt insisted on eating. She reportedly ordered pizza overnight 3/18 and certainly aspirated, desaturated, ultimately suffered PEA arrest, 9 minutes ACLS, copious food material and emesis in oropharynx. Self extubated 3/21, reintubated and extubated 3/26, Trach on 3/31, ATC on 4/4.   Clinical Impression Pt demosntrates improvement in swallow function; now achieving a DIGEST severity score of 3, improved from 4 on prior exam. Pt now has increased instances of epiglottic deflection, hyoid excursion and PES opening, though duration of movement needs improvement. If swallowing small sips of thin liquids, utilizing super-supraglottic and mendelsohn strategies pt is able to propel small sips of thin liquids to esophagus with trace penetration/aspiration that pt preventatively ejects. Cues were given with video biofeedback to reinforce success with pt. Verbal cues included "take a small sip, hold it in your mouth, hold your breath. Now swallow hard and long, then clear your throat and swallow again." Jeremiah Stevens was quite successful with these maneuvers. Purees and soft solids were more difficult, given increased pharyngeal residue, but a liquid wash with similar cues helped to transit bolus. After  further training with SLP, Jeremiah Stevens will be ready to try a full liquid diet. There is risk of aspiration and noncompliance but overall Jeremiah Stevens has been tolerating some aspiration for the sake of therapy and achieving improvement, so we will continue efforts.  DIGEST Swallow Severity Rating*  Safety:   Efficiency:  Overall Pharyngeal Swallow Severity:  1: mild; 2: moderate; 3: severe; 4: profound  *The Dynamic Imaging Grade of Swallowing Toxicity is standardized for the head and neck cancer population, however, demonstrates promising clinical applications across populations to standardize the clinical rating of pharyngeal swallow safety and severity.  Factors that may increase risk of adverse event in presence of aspiration Jeremiah Stevens & Jeremiah Stevens 2021):    Swallow Evaluation Recommendations Liquid Administration via: Cup;No straw Medication Administration: Via alternative means Supervision: Patient able to self-feed Swallowing strategies  : Slow rate;Small bites/sips;Follow solids with liquids;Multiple dry swallows after each bite/sip;effortful swallow;Clear throat intermittently;Hold breath during swallow, followed by a cough (super supraglottic swallow);Swallow hold (Mendelsohn maneuver) Oral care recommendations: Oral care BID (2x/day)      Jeremiah Stevens, Jeremiah Stevens 11/01/2023,1:25 PM

## 2023-11-01 NOTE — Progress Notes (Signed)
 Mobility Specialist Progress Note:    11/01/23 1222  Mobility  Activity Ambulated with assistance in hallway  Level of Assistance Contact guard assist, steadying assist  Assistive Device Front wheel walker  Distance Ambulated (ft) 400 ft  Activity Response Tolerated well  Mobility Referral Yes  Mobility visit 1 Mobility  Mobility Specialist Start Time (ACUTE ONLY) 1040  Mobility Specialist Stop Time (ACUTE ONLY) 1052  Mobility Specialist Time Calculation (min) (ACUTE ONLY) 12 min   Received pt in chair having no complaints and agreeable to mobility. Pt was asymptomatic throughout ambulation and returned to room w/o fault. Left in chair w/ call bell in reach and all needs met.   Inetta Manes Mobility Specialist  Please contact vis Secure Chat or  Rehab Office [phone]

## 2023-11-02 DIAGNOSIS — J189 Pneumonia, unspecified organism: Secondary | ICD-10-CM | POA: Diagnosis not present

## 2023-11-02 LAB — BASIC METABOLIC PANEL WITH GFR
Anion gap: 10 (ref 5–15)
BUN: 18 mg/dL (ref 6–20)
CO2: 25 mmol/L (ref 22–32)
Calcium: 9.2 mg/dL (ref 8.9–10.3)
Chloride: 100 mmol/L (ref 98–111)
Creatinine, Ser: 1.23 mg/dL (ref 0.61–1.24)
GFR, Estimated: >60 mL/min (ref >60)
Glucose, Bld: 119 mg/dL — ABNORMAL HIGH (ref 70–99)
Potassium: 4.3 mmol/L (ref 3.5–5.1)
Sodium: 135 mmol/L (ref 135–145)

## 2023-11-02 LAB — CBC
HCT: 31.4 % — ABNORMAL LOW (ref 39.0–52.0)
Hemoglobin: 10.3 g/dL — ABNORMAL LOW (ref 13.0–17.0)
MCH: 30 pg (ref 26.0–34.0)
MCHC: 32.8 g/dL (ref 30.0–36.0)
MCV: 91.5 fL (ref 80.0–100.0)
Platelets: 274 10*3/uL (ref 150–400)
RBC: 3.43 MIL/uL — ABNORMAL LOW (ref 4.22–5.81)
RDW: 15.8 % — ABNORMAL HIGH (ref 11.5–15.5)
WBC: 4.2 10*3/uL (ref 4.0–10.5)
nRBC: 0 % (ref 0.0–0.2)

## 2023-11-02 LAB — GLUCOSE, CAPILLARY
Glucose-Capillary: 131 mg/dL — ABNORMAL HIGH (ref 70–99)
Glucose-Capillary: 101 mg/dL — ABNORMAL HIGH (ref 70–99)
Glucose-Capillary: 135 mg/dL — ABNORMAL HIGH (ref 70–99)
Glucose-Capillary: 111 mg/dL — ABNORMAL HIGH (ref 70–99)
Glucose-Capillary: 159 mg/dL — ABNORMAL HIGH (ref 70–99)
Glucose-Capillary: 130 mg/dL — ABNORMAL HIGH (ref 70–99)

## 2023-11-02 NOTE — Progress Notes (Signed)
 Physical Therapy Treatment Patient Details Name: Jeremiah Stevens MRN: [id number] DOB: [birth year] Today's Date: 11/02/2023   History of Present Illness 48 yo pt admitted to [facility] 09/26/23 for difficulty swallowing. 3/19 PEA, ROSC after 2 min, then bradycardic arrest 1 min later, CPR resumed, pt intubated, and ROSC 6 min later. Seizure-like activity also noted briefly after ROSC. 3/20 pt transferred to [facility]. 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 3/21 self extubated and reintubated. 3/31 trach. Tracheostomy changed to #6 cuffless 4/14. S/p PEG 4/18. PMHx: HIV/AIDS, HTN, herpes, migraine, depression, DM II, polysubstance use, and seizure.    PT Comments  Progressing well towards functional goals. Supervision with transfers and RW for support. Min assist for one episode of LOB with RW in congested area, navigating around room. RW control becomes a little erratic as she fatigues. Able to navigate majority of distance today at [facility] level, focusing on symmetry of gait, upright posture, and reduced UE support on device. Tolerated LE exercises without issues. Patient will continue to benefit from skilled physical therapy services to further improve independence with functional mobility.    If plan is discharge home, recommend the following: Assistance with cooking/housework;Assist for transportation;Help with stairs or ramp for entrance;A little help with walking and/or transfers;A little help with bathing/dressing/bathroom   Can travel by private vehicle     Yes  Equipment Recommendations  Rolling walker (2 wheels)    Recommendations for Other Services       Precautions / Restrictions Precautions Precautions: Fall;Other (comment) Recall of Precautions/Restrictions: Intact Precaution/Restrictions Comments: trach, PMV, PEG tube Required Braces or Orthoses: Other Brace Other Brace: Abdominal binder for PEG Restrictions Weight Bearing Restrictions Per Provider  Order: No     Mobility  Bed Mobility               General bed mobility comments: In recliner    Transfers Overall transfer level: Needs assistance Equipment used: Rolling walker (2 wheels) Transfers: Sit to/from Stand Sit to Stand: Supervision           General transfer comment: Supervision for safety to stand from recliner, RW for support. Cues for alignment of chair and RW prior to sitting down.    Ambulation/Gait Ambulation/Gait assistance: Min assist Gait Distance (Feet): 200 Feet Assistive device: Rolling walker (2 wheels) Gait Pattern/deviations: Step-through pattern, Decreased stride length, Trunk flexed, Drifts right/left, Staggering right Gait velocity: decreased Gait velocity interpretation: 1.31 - 2.62 ft/sec, indicative of limited community ambulator   General Gait Details: Majority of distance at [facility] level with RW for support, cues for upright posture and reduced UE support on AD. Tolerated well. Had one episode of LOB towards right while turning around bed, lifted RW and became unsteady. Cues for safety, RW on floor at all times, and awareness.   Stairs             Wheelchair Mobility     Tilt Bed    Modified Rankin (Stroke Patients Only) Modified Rankin (Stroke Patients Only) Pre-Morbid Rankin Score: No symptoms Modified Rankin: Moderately severe disability     Balance Overall balance assessment: Needs assistance Sitting-balance support: No upper extremity supported, Feet supported Sitting balance-Leahy Scale: Fair     Standing balance support: No upper extremity supported Standing balance-Leahy Scale: Fair Standing balance comment: Stood CGA                            Musician Communication: Impaired Factors  Affecting Communication: Trach/intubated;Passey - Muir valve  Cognition Arousal: Alert Behavior During Therapy: WFL for tasks assessed/performed   PT - Cognitive impairments: No apparent  impairments Difficult to assess due to: Tracheostomy                     PT - Cognition Comments: reduced clarity of speech on PMV Following commands: Intact Following commands impaired: Follows multi-step commands inconsistently    Cueing Cueing Techniques: Verbal cues  Exercises General Exercises - Lower Extremity Ankle Circles/Pumps: AROM, Both, 10 reps, Seated Long Arc Quad: Seated, 10 reps, Strengthening, Both Hip Flexion/Marching: Seated, Strengthening, 20 reps, Both    General Comments General comments (skin integrity, edema, etc.): Abd in place.      Pertinent Vitals/Pain Pain Assessment Pain Assessment: No/denies pain    Home Living                          Prior Function            PT Goals (current goals can now be found in the care plan section) Acute Rehab PT Goals Patient Stated Goal: Return Home PT Goal Formulation: With patient Time For Goal Achievement: 11/17/23 Potential to Achieve Goals: Good Progress towards PT goals: Progressing toward goals    Frequency    Min 2X/week      PT Plan      Co-evaluation              AM-PAC PT "6 Clicks" Mobility   Outcome Measure  Help needed turning from your back to your side while in a flat bed without using bedrails?: A Little Help needed moving from lying on your back to sitting on the side of a flat bed without using bedrails?: A Little Help needed moving to and from a bed to a chair (including a wheelchair)?: A Little Help needed standing up from a chair using your arms (e.g., wheelchair or bedside chair)?: A Little Help needed to walk in hospital room?: A Little Help needed climbing 3-5 steps with a railing? : A Little 6 Click Score: 18    End of Session Equipment Utilized During Treatment: Gait belt Activity Tolerance: Patient tolerated treatment well Patient left: in chair;with call bell/phone within reach;with chair alarm set   PT Visit Diagnosis: Unsteadiness on  feet (R26.81);Muscle weakness (generalized) (M62.81);Other abnormalities of gait and mobility (R26.89);Difficulty in walking, not elsewhere classified (R26.2);Other symptoms and signs involving the nervous system (R29.898) Hemiplegia - Right/Left: Right Hemiplegia - dominant/non-dominant: Non-dominant Hemiplegia - caused by: Cerebral infarction     Time: 1417-1431 PT Time Calculation (min) (ACUTE ONLY): 14 min  Charges:    $Gait Training: 8-22 mins PT General Charges $$ ACUTE PT VISIT: 1 Visit                     Jory Ng, PT, DPT [facility]  Rehabilitation Services Physical Therapist Office: [phone] Website: [email or web address]    Alinda Irani 11/02/2023, 3:23 PM

## 2023-11-02 NOTE — Progress Notes (Signed)
 PROGRESS NOTE   Jeremiah Stevens  MVH:[id number]    DOB: Jul 30, 1975    DOA: 09/26/2023  PCP: [name], MD   I have briefly reviewed patients previous medical records in [facility].  Chief Complaint  Patient presents with   Dysphagia    Brief Hospital Course:   48 y.o. adult who with medical history including but not limited to DM2, seizures, HIV AIDS untreated, cryptococcal disease, obesity, polysubstance abuse, prior CVA. She was admitted 09/26/23 with dysphagia x 3 days along with headaches and drooling. He was found to be hypoxic to 80s on room air. CTA was negative for PE but showed multifocal PNA. He was started on abx and BD's. Biktarvy  was continued and ID was consulted. 3/18 He had right facial droop, right sided weakness, dysarthria. Neurology was consulted and recommended CTA and MRI. MRI demonstrated acute right medullary infarct. Despite ongoing dysphagia (MBS showed moderately severe pharyngeal phase dysphagia and imaging showed unilateral bulging on right), pt insisted on eating. He reportedly ordered pizza overnight 3/18 and certainly aspirated. He had threatened to leave AMA despite stroke workup ongoing. 3/19, he had hypoxia for which rapid response was called. While being assess, he continued to desaturate down into the 60s and then 40s. He required BVM and was transferred to the ICU for further evaluation and management. Upon arrival in the ICU, she had ongoing desaturations and bradycardia before developing PEA arrest. He had 9 minutes ACLS prior to ROSC including epi x 4 and bicarb x 2. During intubation attempt, she had copious food material and emesis in her oropharynx. Visualization of vocal cords was difficult due to amount of food material and emesis despite suctioning. On 2nd attempt, ETT was passed successfully and saturations improved shortly thereafter. There was grand mal seizure activity noted after intubation that abated after Midazolam   administration.  Significant hospital events:  3/16 admit 3/18 neuro consult, found to have acute CVA 3/19 Ongoing aspiration despite obvious dysphagia, was planning to leave AMA but sister talked pt into staying. then had a massive aspiration event leading to worse hypoxia and PEA arrest. Txf to [facility] 3/20 PSV cumulative total of 5-6 hrs, following some commands/ purposefully 3/21 self extubated, was reintubated 3/23 Failed SBT due to WOB 3/24 Failed SBT due to apnea 3/25 Tolerated PS >4 hours 3/26: Intubated. 3/31: S/p tracheostomy with ENT. 4/4: trach collar starting 1200  4/5 cont on trach collar  4/7: Care transferred from [facility] to [facility] 4/8: Modified barium swallow.  Failed.  NPO. 4/19: PEG tube insertion by IR   Assessment & Plan:   Acute hypoxic resp failure - S/p trach - Multifocal Aspiration PNA - Aspiration event- Hx tobacco use - Patient failed multiple events of extubation and SBT, eventually had tracheostomy done by ENT 3/31. - Currently weaned off the vent, remains trach dependent. -Tracheostomy management per PCCM and ENT Dr. Donalee Fruits.   ENT Dr. Donalee Fruits managing tracheostomy, trach was changed to #6 cuffless on 10/25/2023  -GPC on trach asp 10/12/23, on cefepime  #7/7 -SLP following for dysphagia and PMV valve use. -added CPT  -Unfortunately does not have LTAC benefits - Trach management per PCCM,  downsize to 4 cuffless and, with possible decannulation on Wednesday if okay with ENT  Dysphagia.  - Failed modified barium swallow, IR consulted for PEG tube insertion, PEG inserted by IR 4/18 after holding Plavix  x 5 days, patient is back on Plavix . - Continue with tube feed. - SLP is following closely, advance to full liquid.  AKI -Creatinine back up today to 1.47, avoid hypotension, so I will discontinue Norvasc  and decrease beta-blockers to 50 cc twice daily to avoid hypotension, continue with IV fluids.  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 -ASA, statin, seen by neurology this admission.   - Resume Plavix  tomorrow if ok by IR  Chest pain Elevated D-dimers -Patient with intermittent chest pain overnight , EKG is nonacute, troponins negative x 2, as well elevated, VQ scan is nondiagnostic for PE, but as well cannot rule out given left base perfusion deficiency, so proceeded with CTA, was negative for PE, as well venous Dopplers negative for DVT. Jeremiah Stevens   Acute encephalopathy Anxiety, depression  -minimize CNS depressing meds -delirium precautions  -Encephalopathy has resolved.   PEA arrest  -?Sz like activity following, stable echocardiogram with preserved EF no wall motion abnormality. P -supportive care    HFpEF EF 65% HLD -statin, along with beta-blocker   Hypretension. -  Blood pressure is high.  Medications adjusted, increased beta-blocker. - discontinue Cardizem  and switch to Norvasc .  (Will decrease Norvasc  to avoid low blood pressure reading) - As needed hydralazine  and monitor.    Normocytic anemia -Follow intermittent CBC.  Hemoglobin stable in the 8-9 g range.   HIV / AIDS Hx +cryptococcal antigen  P -Seen by ID currently on following regimen per ID, post discharge follow-up with ID in the office.  Previously untreated.  Counseled on compliance.  -Continue p.o. fluconazole  400 mg daily on dischare QTc 385 -Continue Descovy /Tivicay   -Continue atovaquone  for PJP prophylaxis  DM2, with hyperglycemia, on steroids -cont basal + SSI.  Dosage adjusted on 10/24/2023 as patient was getting transiently hypoglycemic. - Patient is with significant hypoglycemic readings she is on significant dose insulin , I will decrease significantly and allow some elevated readings specially she will be n.p.o. in couple days for anticipated PEG. - Will hold Lantus  this evening and tomorrow morning as will be n.p.o. for PEG tomorrow)  CBG (last 3)  Recent Labs    11/02/23 0315  11/02/23 0755 11/02/23 1133  GLUCAP 131* 101* 135*   Lab Results  Component Value Date   HGBA1C 10.8 (H) 09/29/2023    Code status  - Full  Medical noncompliance.   As per chart review and detailed discussion with overnight ICU RN, patient has medical decision making capacity but is very noncompliant-pulled out feeding tube, attempts to pull out tracheostomy, sickly counseled on being compliant, explained the risk of sudden aspiration, respiratory arrest, death.  Patient swallowed a whole cup of water  against advice on 10/24/2023, warned again of consequences being aspiration, pneumonia, sudden death.  She completely understands it and says will not do it again.    Body mass index is 33.61 kg/m.  Obesity.  Follow with PCP.    Nutritional Status Nutrition Problem: Increased nutrient needs Etiology: chronic illness Signs/Symptoms: estimated needs Interventions: Tube feeding, Prostat   DVT prophylaxis: SCDs Start: 09/27/23 0018.  Lovenox    Code Status: Full Code:  Family Communication: None at bedside today.  Father Archie Bearded  [phone] on 10/21/23   Sister Korie [phone] on 10/25/2023 at 7:53 AM message left  Disposition:  Status is: Inpatient  Consultants:   ENT Psychiatry PCCM Neurology Infectious disease  Subjective:  No significant events overnight night, she denies any complaints, asking if she can eat, I told her to evaluate for repeat SLP evaluation today before making any decisions. Objective:   Vitals:  11/02/23 0959 11/02/23 1130 11/02/23 1131 11/02/23 1519  BP: 103/78 112/84    Pulse: 83 (!) 27 79 87  Resp:  20 (!) 22 (!) 27  Temp:      TempSrc:  Oral    SpO2:   91% 99%  Weight:      Height:        Awake Alert, No new F.N deficits,  tracheostomy site stable with trach collar, capped, no secretions today  Symmetrical Chest wall movement, Good air movement bilaterally, CTAB RRR,No Gallops,Rubs or new Murmurs, No Parasternal Heave +ve B.Sounds, Abd  Soft, No tenderness, No rebound - guarding or rigidity. No Cyanosis, Clubbing or edema, No new Rash or bruise       Data Reviewed:   I have personally reviewed following labs and imaging studies    Data Review:   Inpatient Medications  Scheduled Meds:  aspirin  81 mg Per Tube Daily   atorvastatin  20 mg Per Tube QHS   atovaquone  1,500 mg Per Tube Q lunch   Chlorhexidine  Gluconate Cloth  6 each Topical Daily   clopidogrel  75 mg Per Tube Daily   dolutegravir  50 mg Per Tube Daily   emtricitabine -tenofovir   1 tablet Per Tube Daily   enoxaparin  (LOVENOX ) injection  50 mg Subcutaneous Q24H   famotidine  20 mg Per Tube BID   feeding supplement (PROSource TF20)  60 mL Per Tube Daily   fiber supplement (BANATROL TF)  60 mL Per Tube BID   fluconazole   400 mg Per Tube Daily   free water  100 mL Per Tube Q4H   Gerhardt's butt cream   Topical BID   insulin  aspart  0-20 Units Subcutaneous Q4H   insulin  aspart  8 Units Subcutaneous 4 times per day   insulin  glargine-yfgn  15 Units Subcutaneous BID   metoprolol tartrate  50 mg Per Tube BID   multivitamin with minerals  1 tablet Per Tube Daily   nicotine  14 mg Transdermal Daily   pantoprazole (PROTONIX) IV  40 mg Intravenous QHS   PARoxetine  20 mg Per Tube Daily   QUEtiapine  50 mg Per Tube QHS   Continuous Infusions:  dextrose  Stopped (10/23/23 0856)   feeding supplement (OSMOLITE 1.5 CAL) 1,000 mL (11/01/23 2141)   PRN Meds:.acetaminophen  **OR** acetaminophen , alum & mag hydroxide-simeth, bisacodyl, dextrose , fentaNYL  (SUBLIMAZE ) injection, guaiFENesin-dextromethorphan, hydrALAZINE , hydrOXYzine, iohexol, labetalol , levalbuterol, loperamide HCl, melatonin, ondansetron **OR** ondansetron (ZOFRAN) IV, mouth rinse, oxyCODONE, polyethylene glycol, polyvinyl alcohol  Recent Labs  Lab 10/27/23 0315 10/28/23 0318 10/29/23 0509 10/30/23 0512 11/02/23 0727  WBC 4.3 5.5 5.4 4.6 4.2  HGB 10.1* 10.4* 9.8* 10.1* 10.3*  HCT 31.6* 31.8*  30.2* 31.2* 31.4*  PLT 381 375 322 333 274  MCV 91.6 90.9 90.7 89.9 91.5  MCH 29.3 29.7 29.4 29.1 30.0  MCHC 32.0 32.7 32.5 32.4 32.8  RDW 15.6* 15.8* 15.7* 15.6* 15.8*  LYMPHSABS 1.1 1.3 1.4 1.4  --   MONOABS 0.7 0.9 0.8 0.8  --   EOSABS 0.2 0.2 0.1 0.2  --   BASOSABS 0.0 0.0 0.0 0.0  --     Recent Labs  Lab 10/27/23 0315 10/28/23 0318 10/29/23 0509 10/30/23 0512 11/02/23 0727  NA 134* 133* 134* 132* 135  K 4.2 4.5 4.4 3.9 4.3  CL 97* 97* 101 96* 100  CO2 28 26 26 25 25   ANIONGAP 9 10 7 11 10   GLUCOSE 105* 137* 96 141* 119*  BUN 20 24*  21* 14 18  CREATININE 1.33* 1.48* 1.46* 1.30* 1.23  DDIMER  --  3.08*  --   --   --   INR  --   --  1.1  --   --   MG 2.3 2.4 2.2  --   --   PHOS 3.4 2.8 2.2*  --   --   CALCIUM 9.0 9.1 9.0 9.0 9.2      Recent Labs  Lab 10/27/23 0315 10/28/23 0318 10/29/23 0509 10/30/23 0512 11/02/23 0727  DDIMER  --  3.08*  --   --   --   INR  --   --  1.1  --   --   MG 2.3 2.4 2.2  --   --   CALCIUM 9.0 9.1 9.0 9.0 9.2    --------------------------------------------------------------------------------------------------------------- Lab Results  Component Value Date   CHOL 161 09/29/2023   HDL 28 (L) 09/29/2023   LDLCALC 95 09/29/2023   TRIG 219 (H) 09/30/2023   CHOLHDL 5.8 09/29/2023    Lab Results  Component Value Date   HGBA1C 10.8 (H) 09/29/2023     Micro Results No results found for this or any previous visit (from the past 240 hours).   Radiology Reports  DG Swallowing Func-Speech Pathology Result Date: 11/01/2023 Table formatting from the original result was not included. Modified Barium Swallow Study Patient Details Name: Ho Parisi MRN: [id number] Date of Birth: [birth year] Today's Date: 11/01/2023 HPI/PMH: HPI: Kalyan Terraine Hesch is a 48 y.o. adult who identifies as male, admitted 09/26/23 with dysphagia x 3 days along with headaches and drooling. She was found to be hypoxic to 80s on room air. CTA showed  multifocal PNA.  3/18 she had right facial droop, right sided weakness, dysarthria. MRI demonstrated acute right medullary infarct. Despite ongoing dysphagia (MBS showed moderately severe pharyngeal phase dysphagia and imaging showed unilateral bulging on right), pt insisted on eating. She reportedly ordered pizza overnight 3/18 and certainly aspirated, desaturated, ultimately suffered PEA arrest, 9 minutes ACLS, copious food material and emesis in oropharynx. Self extubated 3/21, reintubated and extubated 3/26, Trach on 3/31, ATC on 4/4. Clinical Impression: Pt demosntrates improvement in swallow function; now achieving a DIGEST severity score of 3, improved from 4 on prior exam. Pt now has increased instances of epiglottic deflection, hyoid excursion and PES opening, though duration of movement needs improvement. If swallowing small sips of thin liquids, utilizing super-supraglottic and mendelsohn strategies pt is able to propel small sips of thin liquids to esophagus with trace penetration/aspiration that pt preventatively ejects. Cues were given with video biofeedback to reinforce success with pt. Verbal cues included "take a small sip, hold it in your mouth, hold your breath. Now swallow hard and long, then clear your throat and swallow again." Marios was quite successful with these maneuvers. Purees and soft solids were more difficult, given increased pharyngeal residue, but a liquid wash with similar cues helped to transit bolus. After further training with SLP, Devarious will be ready to try a full liquid diet. There is risk of aspiration and noncompliance but overall Surya has been tolerating some aspiration for the sake of therapy and achieving improvement, so we will continue efforts.  DIGEST Swallow Severity Rating*             Safety:             Efficiency:             Overall Pharyngeal Swallow Severity: 1: mild; 2: moderate; 3: severe;  4: profound *The Dynamic Imaging Grade of Swallowing Toxicity is  standardized for the head and neck cancer population, however, demonstrates promising clinical applications across populations to standardize the clinical rating of pharyngeal swallow safety and severity. Factors that may increase risk of adverse event in presence of aspiration [name] & Jessy Morocco 2021): No data recorded Recommendations/Plan: Swallowing Evaluation Recommendations Swallowing Evaluation Recommendations Liquid Administration via: Cup; No straw Medication Administration: Via alternative means Supervision: Patient able to self-feed Swallowing strategies  : Slow rate; Small bites/sips; Follow solids with liquids; Multiple dry swallows after each bite/sip; effortful swallow; Clear throat intermittently; Hold breath during swallow, followed by a cough (super supraglottic swallow); Swallow hold (Mendelsohn maneuver) Oral care recommendations: Oral care BID (2x/day) Treatment Plan Treatment Plan Treatment recommendations: Therapy as outlined in treatment plan below Follow-up recommendations: Follow physicians's recommendations for discharge plan and follow up therapies Functional status assessment: Patient has had a recent decline in their functional status and demonstrates the ability to make significant improvements in function in a reasonable and predictable amount of time. Treatment frequency: Min 3x/week Treatment duration: 2 weeks Interventions: Aspiration precaution training; Compensatory techniques; Patient/family education; Trials of upgraded texture/liquids Recommendations Recommendations for follow up therapy are one component of a multi-disciplinary discharge planning process, led by the attending physician.  Recommendations may be updated based on patient status, additional functional criteria and insurance authorization. Assessment: Orofacial Exam: No data recorded Anatomy: Anatomy: WFL Boluses Administered: Boluses Administered Boluses Administered: Thin liquids (Level 0); Mildly thick liquids  (Level 2, nectar thick); Puree; Solid  Oral Impairment Domain: Oral Impairment Domain Lip Closure: No labial escape Tongue control during bolus hold: Posterior escape of less than half of bolus Bolus preparation/mastication: Timely and efficient chewing and mashing Bolus transport/lingual motion: Brisk tongue motion Oral residue: Trace residue lining oral structures Initiation of pharyngeal swallow : Pyriform sinuses  Pharyngeal Impairment Domain: Pharyngeal Impairment Domain Soft palate elevation: Escape to nasopharynx Laryngeal elevation: Partial superior movement of thyroid cartilage/partial approximation of arytenoids to epiglottic petiole Anterior hyoid excursion: Partial anterior movement Epiglottic movement: Partial inversion Laryngeal vestibule closure: Incomplete, narrow column air/contrast in laryngeal vestibule Pharyngeal stripping wave : Present - diminished Pharyngeal contraction (A/P view only): N/A Pharyngoesophageal segment opening: Partial distention/partial duration, partial obstruction of flow Tongue base retraction: Trace column of contrast or air between tongue base and PPW Pharyngeal residue: Collection of residue within or on pharyngeal structures Location of pharyngeal residue: Diffuse (>3 areas)  Esophageal Impairment Domain: No data recorded Pill: No data recorded Penetration/Aspiration Scale Score: Penetration/Aspiration Scale Score 1.  Material does not enter airway: Solid; Puree 2.  Material enters airway, remains ABOVE vocal cords then ejected out: Mildly thick liquids (Level 2, nectar thick) 6.  Material enters airway, passes BELOW cords then ejected out: Thin liquids (Level 0) 8.  Material enters airway, passes BELOW cords without attempt by patient to eject out (silent aspiration) : Thin liquids (Level 0) Compensatory Strategies: Compensatory Strategies Compensatory strategies: Yes Straw: Ineffective Effortful swallow: Effective Effective Effortful Swallow: Thin liquid (Level 0);  Puree; Solid Multiple swallows: Effective Effective Multiple Swallows: Thin liquid (Level 0); Puree; Solid Chin tuck: Ineffective Ineffective Chin Tuck: Puree Liquid wash: Effective Effective Liquid Wash: Thin liquid (Level 0) Super supraglottic swallow: Effective Effective Super Supraglottic Swallow: Thin liquid (Level 0) Mendelsohn : Effective Effective Mendelsohn: Thin liquid (Level 0); Puree; Solid   General Information: Caregiver present: No  Diet Prior to this Study: NPO; G-tube   Temperature : Normal   Respiratory Status: [facility]  Supplemental O2: None (Room air)   No data recorded Behavior/Cognition: Alert; Cooperative Self-Feeding Abilities: Able to self-feed Baseline vocal quality/speech: Other (comment) (wet) Volitional Cough: Able to elicit Volitional Swallow: Able to elicit No data recorded Goal Planning: Prognosis for improved oropharyngeal function: Good Barriers to Reach Goals: Severity of deficits No data recorded Patient/Family Stated Goal: to eat Consulted and agree with results and recommendations: Patient Pain: No data recorded End of Session: Start Time:SLP Start Time (ACUTE ONLY): 1230 Stop Time: SLP Stop Time (ACUTE ONLY): 1300 Time Calculation:SLP Time Calculation (min) (ACUTE ONLY): 30 min Charges: SLP Evaluations $ SLP Speech Visit: 1 Visit SLP Evaluations $MBS Swallow: 1 Procedure $Swallowing Treatment: 1 Procedure SLP visit diagnosis: SLP Visit Diagnosis: Dysphagia, pharyngeal phase (R13.13) Past Medical History: Past Medical History: Diagnosis Date  Cancer (HCC)   seizures  Diabetes (HCC)   Diabetes mellitus without complication (HCC)   Heartburn   occasional; OTC as needed  Herpes genitalis in men   HIV (human immunodeficiency virus infection) (HCC)   HIV disease (HCC)   HTN (hypertension)   Hyperlipidemia   Hypertension   under control with med., has been on med. x 1 yr.  Lateral malleolar fracture 09/02/2013  left  Migraines   Tear of deltoid ligament of left ankle 09/02/2013  Type 2  diabetes mellitus with hyperosmolar nonketotic hyperglycemia (HCC) 02/10/2020 Past Surgical History: Past Surgical History: Procedure Laterality Date  IR GASTROSTOMY TUBE MOD SED  10/29/2023  NO PAST SURGERIES    ORIF ANKLE FRACTURE Left 09/13/2013  Procedure: OPEN REDUCTION INTERNAL FIXATION (ORIF) LEFT LATERAL MALLEOLUS ANKLE FRACTURE ;  Surgeon: Christie Cox, MD;  Location: [facility];  Service: Orthopedics;  Laterality: Left;  TRACHEOSTOMY TUBE PLACEMENT N/A 10/11/2023  Procedure: CREATION, TRACHEOSTOMY;  Surgeon: Janita Mellow, MD;  Location[facility] OR;  Service: ENT;  Laterality: N/A; DeBlois, Hardin Leys 11/01/2023, 1:27 PM   Signature  -   Seena Dadds M.D on 11/02/2023 at 3:37 PM   -  To page go to [email or web address]

## 2023-11-02 NOTE — Progress Notes (Signed)
 Speech Language Pathology Treatment: Dysphagia  Patient Details Name: Galileo Colello MRN: [id number] DOB: [birth year] Today's Date: 11/02/2023 Time: 4098-1191 SLP Time Calculation (min) (ACUTE ONLY): 28 min  Assessment / Plan / Recommendation Clinical Impression  Pt was seen for dysphagia tx to focus on swallowing exercises and compensatory strategies. Pt was upright in chair for session. Pt was observed with thin-liquids and oatmeal. Pt demonstrated immediate and delayed coughing with wet vocal quality during trials.   Pt practiced swallow strategies (super-supraglottic swallow, effortful swallow, Mendelsohn) previously utilized during recent MBS (04/21). Pt was 85% consistent with using swallow strategies with thin-liquids and oatmeal when given min verbal cueing. Initially, pt was highly consistent (~95%) with utilizing exercises; however, pt performance slightly decreased as pt became more distracted during conversation. Pt also used compensatory strategies (liquid wash and throat clearing) with 90% consistency with min verbal cueing. Pt displayed expectoration of oral secretions and a strong cough. Pt will need reinforcement of swallow exercises and compensatory strategies. SLP discussed with pt about starting a full-liquid diet and pt's ongoing risk for aspiration and potential complications. Pt verbalized understanding of risks and wishes to begin a full-liquid diet.  Recommend full-liquid diet to facilitate swallow rehabilitation. Pt must utilize swallow exercises/compensatory strategies with minimal distractions to optimize pt's airway safety and overall wellbeing. SLP will f/u to reinforce swallowing exercises.    HPI HPI: Quinnten Terraine Dente is a 48 y.o. adult who identifies as male, admitted 09/26/23 with dysphagia x 3 days along with headaches and drooling. She was found to be hypoxic to 80s on room air. CTA showed multifocal PNA.  3/18 she had right facial droop, right sided  weakness, dysarthria. MRI demonstrated acute right medullary infarct. Despite ongoing dysphagia (MBS showed moderately severe pharyngeal phase dysphagia and imaging showed unilateral bulging on right), pt insisted on eating. She reportedly ordered pizza overnight 3/18 and certainly aspirated, desaturated, ultimately suffered PEA arrest, 9 minutes ACLS, copious food material and emesis in oropharynx. Self extubated 3/21, reintubated and extubated 3/26, Trach on 3/31, ATC on 4/4.      SLP Plan  Continue with current plan of care      Recommendations for follow up therapy are one component of a multi-disciplinary discharge planning process, led by the attending physician.  Recommendations may be updated based on patient status, additional functional criteria and insurance authorization.    Recommendations  Diet recommendations: Thin liquid Liquids provided via: Cup;Straw Medication Administration: Via alternative means Supervision: Full supervision/cueing for compensatory strategies Compensations: Slow rate;Small sips/bites;Clear throat after each swallow;Effortful swallow;Follow solids with liquid;Multiple dry swallows after each bite/sip;Minimize environmental distractions;Other (Comment) (Utilize hard breath hold before swallow.) Postural Changes and/or Swallow Maneuvers: Seated upright 90 degrees;Upright 30-60 min after meal                  Oral care QID;Oral care before and after PO   Frequent or constant Supervision/Assistance Dysphagia, pharyngeal phase (R13.13)     Continue with current plan of care     Aurelia Leeks  11/02/2023, 9:56 AM

## 2023-11-02 NOTE — Plan of Care (Signed)
  Problem: Metabolic: Goal: Ability to maintain appropriate glucose levels will improve Outcome: Progressing   Problem: Nutritional: Goal: Maintenance of adequate nutrition will improve Outcome: Progressing Goal: Progress toward achieving an optimal weight will improve Outcome: Progressing   Problem: Skin Integrity: Goal: Risk for impaired skin integrity will decrease Outcome: Progressing   Problem: Tissue Perfusion: Goal: Adequacy of tissue perfusion will improve Outcome: Progressing   Problem: Education: Goal: Knowledge of disease or condition will improve Outcome: Progressing   Problem: Ischemic Stroke/TIA Tissue Perfusion: Goal: Complications of ischemic stroke/TIA will be minimized Outcome: Progressing   Problem: Coping: Goal: Will verbalize positive feelings about self Outcome: Progressing Goal: Will identify appropriate support needs Outcome: Progressing

## 2023-11-02 NOTE — Plan of Care (Signed)
  Problem: Health Behavior/Discharge Planning: Goal: Ability to identify and utilize available resources and services will improve Outcome: Progressing Goal: Ability to manage health-related needs will improve Outcome: Progressing   Problem: Nutritional: Goal: Maintenance of adequate nutrition will improve Outcome: Progressing   Problem: Skin Integrity: Goal: Risk for impaired skin integrity will decrease Outcome: Progressing   Problem: Education: Goal: Knowledge of General Education information will improve Description: Including pain rating scale, medication(s)/side effects and non-pharmacologic comfort measures Outcome: Progressing   Problem: Health Behavior/Discharge Planning: Goal: Ability to manage health-related needs will improve Outcome: Progressing   Problem: Clinical Measurements: Goal: Ability to maintain clinical measurements within normal limits will improve Outcome: Progressing   Problem: Activity: Goal: Risk for activity intolerance will decrease Outcome: Progressing   Problem: Nutrition: Goal: Adequate nutrition will be maintained Outcome: Progressing   Problem: Coping: Goal: Level of anxiety will decrease Outcome: Progressing

## 2023-11-02 NOTE — Progress Notes (Signed)
 Nutrition Follow-up  DOCUMENTATION CODES:   Obesity unspecified  INTERVENTION:  Continue current tube feeding regimen via PEG: Osmolite 1.5 at 85mL/h x 18 hours (1530 ml per day, infuse from 12PM to 6AM) Ensure that tube feed is held 2 hours prior to Tivicay and 1 hour after (from 8:30AM-11:30AM) Prosource TF20 60 ml 1x/d Free water flushes 100mL q4 hours   Provides 2375 kcal, 116 gm protein, 1166 ml free water daily (TF + flush = 1766 mL free water)   Continue MVI with minerals daily via tube  Continue Banatrol TF BID via tube  Monitor diet advancement and tolerance for ability to reduce/wean tube feeding regimen - currently on 18 hours of TF Add Ensure Enlive po TID, each supplement provides 350 kcal and 20 grams of protein   NUTRITION DIAGNOSIS:  Increased nutrient needs related to chronic illness as evidenced by estimated needs. - remains applicable  GOAL:  Patient will meet greater than or equal to 90% of their needs - met w/ tube feeds running at goal rate  MONITOR:  TF tolerance  REASON FOR ASSESSMENT:  Consult Enteral/tube feeding initiation and management  ASSESSMENT:   48 y.o. adult with medical history significant of diabetes type 2, seizure disorder, HIV untreated, obesity, polysubstance abuse .  Patient is admitted for pneumonia with hypoxia in the setting of HIV.  Previous Admission 2/12 Admitted for metabolic encephalopathy 2/14 - Cortrak placed 2/15 - Cortrak removed, Passed swallow eval, carb modified diet  Current Admission 3/16 - Admitted to [facility] for PNA 3/18 - MBS showed chronic aspiration of secretions, no diet formally recommended, MRI showed acute CVA 3/19 - PEA arrest and intubation s/p aspiration event, txr to ICU 3/20 - transferred to [facility] for continuous EEG monitoring, trickles initiated 3/21 - cortrak tube placed, pt self-extubated, emergently reintubated 3/23 - SBT: failed 3/24 - SBT: failed 3/25 - tolerated PS >4 hours 3/26  - intubated  3/29 - pt TF rate increased r/t feeling hungry 3/31 - tracheostomy placed in OR with ENT 4/4 - transitioned to trach collar  4/7 - txr to floor from ICU  4/8 - MBSS: Failed - NPO 4/9 - trach changed to #8 Shiley cuffed trach 4/14 - trach changed and reduced in size (#6 cuffless) 4/18 - PEG tube placement  4/22 - advanced to full liquid diet  Pt advanced to full liquid diet today, per speech. Noted she must use compensatory strategies to optimize her airway during intake. Speech notes limiting distractions. Verbalized understanding of risks associated with aspiration event. She is much more alert and awake today compared to previous meeting. No s/sx of intolerance to TF running at goal rate via PEG. No recently endorsed significant N/V/C/D.    Average Meal Intake No intake to review as she was just advanced   She is encouraged by progress with speech and hopes to continue to improve. Continues with significant secretions that she coughs up during interaction. Encouraged increased protein/calorie intake as diet advances in effort to wean tube feed as much as possible. She is amenable to Ensure Enlive supplements to augment intake in effort to reduce TF regimen.   Of note, pt requesting a visit from social work regarding Medicaid eligibility. Directed her to phone number on the back of her Medicaid card.  Admit Weight: 115.8kg Current Weight: 112.4kg Lowest Weight: 104.9kg on 3/29  No significant edema on exam. Generalized to bilateral upper and lower extremities. Adequate weight to frame observed. Wt trending back up toward admission  weight. No new or significant skin breakdown.  Drains/Lines: Tracheostomy (Shiley flexible 6mm uncuffed) changed on 4/14 LUQ: PEG (20Fr) placed on 4/18 UOP: x24 hours   Semglee  reduced and SSI 0-8 QID discontinued in last week. Blood sugars stabilizing.    Meds: tivicay, fluconazole , SSI 0-20 q4, Semglee  15 BID, MVI, nyastatin,  pantoprazole, paroxetine   Labs: Na+ 135 (wdl) K+ 4.3 (wdl) Mg 2.4 (wdl) CBGs 119-141 x72 hours A1c 10.8 (09/2023)  Diet Order:   Diet Order             Diet full liquid Room service appropriate? Yes with Assist; Fluid consistency: Thin  Diet effective now            EDUCATION NEEDS:  Education needs have been addressed  Skin:  Skin Assessment: Reviewed RN Assessment  Last BM:  4/21 - type 6 x1  Height:  Ht Readings from Last 1 Encounters:  10/07/23 6' (1.829 m)   Weight:  Wt Readings from Last 1 Encounters:  11/02/23 112.4 kg   Ideal Body Weight:  80.9 kg  BMI:  Body mass index is 33.61 kg/m.  Estimated Nutritional Needs:   Kcal:  2200-2500 kcal/d  Protein:  110-120g  Fluid:  2.4L/day  Con Decant MS, RD, LDN Registered Dietitian Clinical Nutrition RD Inpatient Contact Info in Amion

## 2023-11-03 DIAGNOSIS — J690 Pneumonitis due to inhalation of food and vomit: Secondary | ICD-10-CM | POA: Diagnosis not present

## 2023-11-03 DIAGNOSIS — I10 Essential (primary) hypertension: Secondary | ICD-10-CM | POA: Diagnosis not present

## 2023-11-03 DIAGNOSIS — E1165 Type 2 diabetes mellitus with hyperglycemia: Secondary | ICD-10-CM | POA: Diagnosis not present

## 2023-11-03 DIAGNOSIS — J9601 Acute respiratory failure with hypoxia: Secondary | ICD-10-CM | POA: Diagnosis not present

## 2023-11-03 LAB — GLUCOSE, CAPILLARY
Glucose-Capillary: 112 mg/dL — ABNORMAL HIGH (ref 70–99)
Glucose-Capillary: 146 mg/dL — ABNORMAL HIGH (ref 70–99)
Glucose-Capillary: 149 mg/dL — ABNORMAL HIGH (ref 70–99)
Glucose-Capillary: 107 mg/dL — ABNORMAL HIGH (ref 70–99)
Glucose-Capillary: 158 mg/dL — ABNORMAL HIGH (ref 70–99)

## 2023-11-03 NOTE — Progress Notes (Signed)
 SLP  Note  Patient Details Name: Jeremiah Stevens MRN: [id number] DOB: 01-Nov-1975   Checked in with Baker Bon. She is resting in bed, so she didn't practice any swallowing today for me, but she verbalized really good awareness of her strategies. She is making slow, steady progress and tolerating thin liquids. Suspect there are some instances of aspiration, which she also endorses. She is learning to go slow and stay attentive. Will f/u again tomorrow. She hopes to get decannulated soon.   Dorian Renfro, Hardin Leys 11/03/2023, 2:44 PM

## 2023-11-03 NOTE — Plan of Care (Signed)
  Problem: Metabolic: Goal: Ability to maintain appropriate glucose levels will improve Outcome: Progressing   Problem: Nutritional: Goal: Maintenance of adequate nutrition will improve Outcome: Progressing   Problem: Skin Integrity: Goal: Risk for impaired skin integrity will decrease Outcome: Progressing   Problem: Tissue Perfusion: Goal: Adequacy of tissue perfusion will improve Outcome: Progressing   Problem: Pain Managment: Goal: General experience of comfort will improve and/or be controlled Outcome: Progressing   Problem: Respiratory: Goal: Ability to maintain adequate ventilation will improve Outcome: Progressing

## 2023-11-03 NOTE — Progress Notes (Signed)
 PROGRESS NOTE        PATIENT DETAILS Name: Jeremiah Stevens Age: 48 y.o. Sex: adult Date of Birth: 1975/10/27 Admit Date: 09/26/2023 Admitting Physician Arlina Lair, MD [id number], Adah Acron, MD  Brief Summary: 48 y.o. adult who with medical of DM2, seizures, HIV AIDS untreated, cryptococcal disease, obesity, polysubstance abuse, prior CVA who was admitted on 3/16 with dysphagia/drooling- found to be hypoxic-further evaluation revealed multifocal PNA.  Patient was started on antibiotics and admitted to the hospitalist service-Hospital course complicated by acute CVA-despite ongoing dysphagia-patient was noncompliant with diet (threatening to leave AMA while stroke workup ongoing)-reportedly ordered a pizza and had an aspiration event-with worsening hypoxemia-requiring transfer to the ICU where he had a PEA arrest-requiring 9 minutes of ACLS prior to ROSC.  During intubation-copious amounts of food material/emesis was observed in the oropharynx.  Patient had seizure-like activity after intubation.  He was subsequently managed in the ICU-was difficult to wean off the ventilator-he self extubated but had to be reintubated-ultimately ENT performed tracheostomy on 3/31.  He was transferred back to [facility] service on 4/7.  Significant events: 3/16 admit 3/18 neuro consult, found to have acute CVA 3/19 Ongoing aspiration despite obvious dysphagia, was planning to leave AMA but sister talked pt into staying. then had a massive aspiration event leading to worse hypoxia and PEA arrest. Txf to [facility] 3/20 PSV cumulative total of 5-6 hrs, following some commands/ purposefully 3/21 self extubated, was reintubated 3/23 Failed SBT due to WOB 3/24 Failed SBT due to apnea 3/25 Tolerated PS >4 hours 3/26: Intubated. 3/31: S/p tracheostomy with ENT. 4/4: trach collar starting 1200  4/5 cont on trach collar  4/7: Care transferred from [facility] to [facility] 4/8: Modified barium swallow.  Failed.   NPO. 4/19: PEG tube insertion by IR  Significant studies: 3/15>> CT head: No acute intracranial abnormality. 3/16>> CTA chest: No PE 3/19>> MRI brain: Acute right medullary infarct. 3/19>> CT angio head/neck: No LVO 3/19>> LDL: 95 3/19>> A1c: 10.8 3/19>> echo: EF 60-65%, grade 1 diastolic dysfunction. 3/20-3/21>> LTM EEG: No seizures. 4/12>> CT abdomen: Gastric anatomy is borderline for percutaneous gastrostomy tube placement. 4/17>> CTA chest: No PE. 4/17>> B/L lower extremity Doppler: No DVT.  Significant microbiology data: 3/16>> COVID/influenza/RSV PCR: Negative 3/16>> blood culture: No growth 3/17>> sputum culture: Normal respiratory flora 3/19>> tracheal aspirate: No organisms.   3/20>> sputum culture: Normal respiratory flora 3/27>> blood culture: No growth 4/01>> tracheal aspirate: Normal respiratory flora 4/02>> blood culture: No growth  Procedures: 4/18>> PEG tube by IR.  Consults: Neurology PCCM  Subjective: Lying comfortably in bed-denies any chest pain or shortness of breath.  Objective: Vitals: Blood pressure 127/81, pulse 91, temperature 98.8 F (37.1 C), temperature source Oral, resp. rate 20, height 6' (1.829 m), weight 112.4 kg, SpO2 98%.   Exam: Gen Exam:Alert awake-not in any distress HEENT:atraumatic, normocephalic Chest: B/L clear to auscultation anteriorly CVS:S1S2 regular Abdomen:soft non tender, non distended Extremities:no edema Neurology: Moving all 4 extremities. Skin: no rash  Pertinent Labs/Radiology:    Latest Ref Rng & Units 11/02/2023    7:27 AM 10/30/2023    5:12 AM 10/29/2023    5:09 AM  CBC  WBC 4.0 - 10.5 K/uL 4.2  4.6  5.4   Hemoglobin 13.0 - 17.0 g/dL 86.5  78.4  9.8   Hematocrit 39.0 - 52.0 % 31.4  31.2  30.2  Platelets 150 - 400 K/uL 274  333  322     Lab Results  Component Value Date   NA 135 11/02/2023   K 4.3 11/02/2023   CL 100 11/02/2023   CO2 25 11/02/2023      Assessment/Plan: Acute hypoxic  respiratory failure secondary to multifocal pneumonia and subsequently an aspiration event Overall stable-weaned off vent-required tracheostomy on 3/31 by ENT Trach management per PCCM with potential decannulation today. Has completed a course of antibiotics.  PEA arrest Secondary to aspiration event/possible seizures Telemetry monitoring Echo stable No significant anoxic issues-follows commands-moving all 4 extremities.  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 Continue statin  Dysphagia Likely oral pharyngeal dysphagia in the setting of CVA. Failed MBS-PEG inserted by IR on 4/5:18 days of Plavix washout Overall slowly improving-SLP following-full liquid diet which-patient is tolerating.  AKI Likely hemodynamically mediated Resolved.  Chronic HFpEF Euvolemic  Chest pain Elevated D-dimer No further chest pain CTA chest/Doppler study negative for VTE. On SQ Lovenox  prophylactic dose.  HTN BP stable Continue metoprolol  HIV/AIDS Continue antiretrovirals On Mepron for PJP prophylaxis Previously noncompliant to medications.  History of cryptococcal antigenemia  On fluconazole .  Normocytic anemia Due to critical illness/chronic disease Hb stable-follow periodically.  DM-2 (A1c 10.8 on 3/19) CBGs stable Semglee  15 units twice daily+ 8 units of NovoLog  4 times daily+SSI  Recent Labs    11/03/23 0304 11/03/23 0734 11/03/23 1128  GLUCAP 112* 146* 149*    GERD Pepcid  Anxiety/depression Stable Continue Paxil and Seroquel.  Medical noncompliance Previously noncompliant to ART's Has been noncompliant with diet-had a significant aspiration event. Per prior notes-has pulled out feeding tube-has attempted to pull a tracheotomy-prior MD's has explained the risk of  sudden aspiration/respiratory arrest/death.  Nutrition Status: Nutrition Problem: Increased nutrient needs Etiology: chronic illness Signs/Symptoms: estimated needs Interventions: Tube feeding, Prostat  Class 1 Obesity: Estimated body mass index is 33.61 kg/m as calculated from the following:   Height as of this encounter: 6' (1.829 m).   Weight as of this encounter: 112.4 kg.   Code status:   Code Status: Full Code   DVT Prophylaxis: SQ Lovenox    Family Communication: None at bedside   Disposition Plan: Status is: Inpatient Remains inpatient appropriate because: Severity of illness   Planned Discharge Destination:Skilled nursing facility   Diet: Diet Order             Diet full liquid Room service appropriate? Yes with Assist; Fluid consistency: Thin  Diet effective now                     Antimicrobial agents: Anti-infectives (From admission, onward)    Start     Dose/Rate Route Frequency Ordered Stop   10/29/23 1200  fluconazole  (DIFLUCAN ) 40 MG/ML suspension 400 mg  Status:  Discontinued        400 mg Per Tube  Once 10/29/23 1106 10/29/23 1217   10/29/23 0830  vancomycin (VANCOCIN) IVPB 1000 mg/200 mL premix        over 60 Minutes Intravenous Continuous PRN 10/29/23 0838 10/29/23 0830   10/29/23 0000  vancomycin (VANCOREADY) IVPB 1500 mg/300 mL        1,500 mg 150 mL/hr over 120 Minutes Intravenous To Radiology 10/26/23 1559 10/29/23 0325   10/15/23 1000  emtricitabine -tenofovir  (TRUVADA) 200-300 MG per tablet 1 tablet  1 tablet Per Tube Daily 10/15/23 0836     10/15/23 0900  ceFEPIme (MAXIPIME) 2 g in sodium chloride  0.9 % 100 mL IVPB        2 g 200 mL/hr over 30 Minutes Intravenous Every 8 hours 10/15/23 0846 10/22/23 0959   10/14/23 1000  emtricitabine -tenofovir  (TRUVADA) 200-300 MG per tablet 1 tablet  Status:  Discontinued        1 tablet Oral Daily 10/13/23 1340 10/15/23 0836   10/13/23 0100  ceFEPIme (MAXIPIME) 2 g in sodium chloride  0.9  % 100 mL IVPB  Status:  Discontinued        2 g 200 mL/hr over 30 Minutes Intravenous Every 8 hours 10/12/23 2352 10/15/23 0846   10/13/23 0100  metroNIDAZOLE (FLAGYL) IVPB 500 mg  Status:  Discontinued        500 mg 100 mL/hr over 60 Minutes Intravenous Every 12 hours 10/12/23 2352 10/15/23 0846   10/09/23 1200  atovaquone (MEPRON) 750 MG/5ML suspension 1,500 mg        1,500 mg Per Tube Daily with lunch 10/09/23 1025     10/09/23 1115  fluconazole  (DIFLUCAN ) tablet 400 mg        400 mg Per Tube Daily 10/09/23 1025     10/03/23 1100  clindamycin (CLEOCIN) IVPB 900 mg  Status:  Discontinued        900 mg 100 mL/hr over 30 Minutes Intravenous Every 8 hours 10/03/23 1014 10/09/23 1025   10/03/23 1100  primaquine tablet 30 mg  Status:  Discontinued        30 mg Per NG tube Daily 10/03/23 1014 10/09/23 1025   10/02/23 2200  metroNIDAZOLE (FLAGYL) tablet 500 mg  Status:  Discontinued        500 mg Per Tube Every 12 hours 10/02/23 1140 10/03/23 1014   09/30/23 1030  dolutegravir (TIVICAY) tablet 50 mg        50 mg Per Tube Daily 09/30/23 0931     09/30/23 1030  emtricitabine -tenofovir  AF (DESCOVY) 200-25 MG per tablet 1 tablet  Status:  Discontinued        1 tablet Per Tube Daily 09/30/23 0931 10/13/23 1340   09/30/23 1000  sulfamethoxazole -trimethoprim  (BACTRIM  DS) 800-160 MG per tablet 1 tablet  Status:  Discontinued        1 tablet Per Tube Daily 09/29/23 1637 10/03/23 1014   09/29/23 1800  metroNIDAZOLE (FLAGYL) IVPB 500 mg  Status:  Discontinued        500 mg 100 mL/hr over 60 Minutes Intravenous Every 12 hours 09/29/23 1716 10/02/23 1140   09/29/23 1400  cefTRIAXone (ROCEPHIN) 2 g in sodium chloride  0.9 % 100 mL IVPB        2 g 200 mL/hr over 30 Minutes Intravenous Every 24 hours 09/29/23 1317 10/02/23 1420   09/28/23 1445  fluconazole  (DIFLUCAN ) IVPB 400 mg  Status:  Discontinued        400 mg 100 mL/hr over 120 Minutes Intravenous Daily 09/28/23 1359 10/09/23 1025   09/27/23 1600   cefTRIAXone (ROCEPHIN) 2 g in sodium chloride  0.9 % 100 mL IVPB  Status:  Discontinued        2 g 200 mL/hr over 30 Minutes Intravenous Daily 09/27/23 1004 09/29/23 1317   09/27/23 1000  azithromycin  (ZITHROMAX ) tablet 500 mg  Status:  Discontinued        500 mg Oral Daily 09/26/23 2307 09/27/23 1004   09/27/23 1000  bictegravir-emtricitabine -tenofovir  AF (BIKTARVY ) 50-200-25 MG per tablet  1 tablet  Status:  Discontinued        1 tablet Oral Daily 09/27/23 0004 09/30/23 0931   09/27/23 1000  sulfamethoxazole -trimethoprim  (BACTRIM  DS) 800-160 MG per tablet 1 tablet  Status:  Discontinued        1 tablet Oral Daily 09/27/23 0017 09/29/23 1637   09/27/23 0800  ceFEPIme (MAXIPIME) 2 g in sodium chloride  0.9 % 100 mL IVPB  Status:  Discontinued        2 g 200 mL/hr over 30 Minutes Intravenous Every 8 hours 09/26/23 2333 09/27/23 1004   09/27/23 0000  fluconazole  (DIFLUCAN ) tablet 400 mg  Status:  Discontinued        400 mg Oral Daily 09/26/23 2352 09/28/23 1359   09/26/23 2300  ceFEPIme (MAXIPIME) 2 g in sodium chloride  0.9 % 100 mL IVPB        2 g 200 mL/hr over 30 Minutes Intravenous  Once 09/26/23 2245 09/26/23 2357   09/26/23 2300  vancomycin (VANCOREADY) IVPB 2000 mg/400 mL        2,000 mg 200 mL/hr over 120 Minutes Intravenous  Once 09/26/23 2245 09/27/23 0305        MEDICATIONS: Scheduled Meds:  aspirin  81 mg Per Tube Daily   atorvastatin  20 mg Per Tube QHS   atovaquone  1,500 mg Per Tube Q lunch   Chlorhexidine  Gluconate Cloth  6 each Topical Daily   clopidogrel  75 mg Per Tube Daily   dolutegravir  50 mg Per Tube Daily   emtricitabine -tenofovir   1 tablet Per Tube Daily   enoxaparin  (LOVENOX ) injection  50 mg Subcutaneous Q24H   famotidine  20 mg Per Tube BID   feeding supplement (PROSource TF20)  60 mL Per Tube Daily   fiber supplement (BANATROL TF)  60 mL Per Tube BID   fluconazole   400 mg Per Tube Daily   free water  100 mL Per Tube Q4H   Gerhardt's butt cream    Topical BID   insulin  aspart  0-20 Units Subcutaneous Q4H   insulin  aspart  8 Units Subcutaneous 4 times per day   insulin  glargine-yfgn  15 Units Subcutaneous BID   metoprolol tartrate  50 mg Per Tube BID   multivitamin with minerals  1 tablet Per Tube Daily   nicotine  14 mg Transdermal Daily   pantoprazole (PROTONIX) IV  40 mg Intravenous QHS   PARoxetine  20 mg Per Tube Daily   QUEtiapine  50 mg Per Tube QHS   Continuous Infusions:  dextrose  Stopped (10/23/23 0856)   feeding supplement (OSMOLITE 1.5 CAL) 1,000 mL (11/01/23 2141)   PRN Meds:.acetaminophen  **OR** acetaminophen , alum & mag hydroxide-simeth, bisacodyl, dextrose , fentaNYL  (SUBLIMAZE ) injection, guaiFENesin-dextromethorphan, hydrALAZINE , hydrOXYzine, iohexol, labetalol , levalbuterol, loperamide HCl, melatonin, ondansetron **OR** ondansetron (ZOFRAN) IV, mouth rinse, oxyCODONE, polyethylene glycol, polyvinyl alcohol   I have personally reviewed following labs and imaging studies  LABORATORY DATA: CBC: Recent Labs  Lab 10/28/23 0318 10/29/23 0509 10/30/23 0512 11/02/23 0727  WBC 5.5 5.4 4.6 4.2  NEUTROABS 3.1 3.0 2.2  --   HGB 10.4* 9.8* 10.1* 10.3*  HCT 31.8* 30.2* 31.2* 31.4*  MCV 90.9 90.7 89.9 91.5  PLT 375 322 333 274    Basic Metabolic Panel: Recent Labs  Lab 10/28/23 0318 10/29/23 0509 10/30/23 0512 11/02/23 0727  NA 133* 134* 132* 135  K 4.5 4.4 3.9 4.3  CL 97* 101 96* 100  CO2 26 26 25 25   GLUCOSE 137* 96 141* 119*  BUN  24* 21* 14 18  CREATININE 1.48* 1.46* 1.30* 1.23  CALCIUM 9.1 9.0 9.0 9.2  MG 2.4 2.2  --   --   PHOS 2.8 2.2*  --   --     GFR: Estimated Creatinine Clearance (by C-G formula based on SCr of 1.23 mg/dL) Male: 16.1 mL/min Male: 96.1 mL/min  Liver Function Tests: No results for input(s): "AST", "ALT", "ALKPHOS", "BILITOT", "PROT", "ALBUMIN" in the last 168 hours. No results for input(s): "LIPASE", "AMYLASE" in the last 168 hours. No results for input(s): "AMMONIA"  in the last 168 hours.  Coagulation Profile: Recent Labs  Lab 10/29/23 0509  INR 1.1    Cardiac Enzymes: No results for input(s): "CKTOTAL", "CKMB", "CKMBINDEX", "TROPONINI" in the last 168 hours.  BNP (last 3 results) No results for input(s): "PROBNP" in the last 8760 hours.  Lipid Profile: No results for input(s): "CHOL", "HDL", "LDLCALC", "TRIG", "CHOLHDL", "LDLDIRECT" in the last 72 hours.  Thyroid Function Tests: No results for input(s): "TSH", "T4TOTAL", "FREET4", "T3FREE", "THYROIDAB" in the last 72 hours.  Anemia Panel: No results for input(s): "VITAMINB12", "FOLATE", "FERRITIN", "TIBC", "IRON", "RETICCTPCT" in the last 72 hours.  Urine analysis:    Component Value Date/Time   COLORURINE YELLOW 09/25/2023 0052   APPEARANCEUR CLEAR 09/25/2023 0052   LABSPEC 1.028 09/25/2023 0052   PHURINE 5.0 09/25/2023 0052   GLUCOSEU >=500 (A) 09/25/2023 0052   HGBUR SMALL (A) 09/25/2023 0052   BILIRUBINUR NEGATIVE 09/25/2023 0052   KETONESUR NEGATIVE 09/25/2023 0052   PROTEINUR 30 (A) 09/25/2023 0052   UROBILINOGEN 0.2 10/08/2011 1145   NITRITE NEGATIVE 09/25/2023 0052   LEUKOCYTESUR NEGATIVE 09/25/2023 0052    Sepsis Labs: Lactic Acid, Venous    Component Value Date/Time   LATICACIDVEN 1.9 09/29/2023 1747    MICROBIOLOGY: No results found for this or any previous visit (from the past 240 hours).  RADIOLOGY STUDIES/RESULTS: No results found.   LOS: 38 days   Kimberly Penna, MD  [facility]    To contact the attending provider between 7A-7P or the covering provider during after hours 7P-7A, please log into the web site [email or web address] and access using universal [place] password for that web site. If you do not have the password, please call the hospital operator.  11/03/2023, 1:31 PM

## 2023-11-03 NOTE — Plan of Care (Signed)
  Problem: Health Behavior/Discharge Planning: Goal: Ability to manage health-related needs will improve Outcome: Progressing   Problem: Nutritional: Goal: Maintenance of adequate nutrition will improve Outcome: Progressing Goal: Progress toward achieving an optimal weight will improve Outcome: Progressing   Problem: Skin Integrity: Goal: Risk for impaired skin integrity will decrease Outcome: Progressing

## 2023-11-04 DIAGNOSIS — J9601 Acute respiratory failure with hypoxia: Secondary | ICD-10-CM | POA: Diagnosis not present

## 2023-11-04 DIAGNOSIS — J690 Pneumonitis due to inhalation of food and vomit: Secondary | ICD-10-CM | POA: Diagnosis not present

## 2023-11-04 DIAGNOSIS — Z930 Tracheostomy status: Secondary | ICD-10-CM | POA: Diagnosis not present

## 2023-11-04 DIAGNOSIS — I10 Essential (primary) hypertension: Secondary | ICD-10-CM | POA: Diagnosis not present

## 2023-11-04 DIAGNOSIS — E1165 Type 2 diabetes mellitus with hyperglycemia: Secondary | ICD-10-CM | POA: Diagnosis not present

## 2023-11-04 LAB — GLUCOSE, CAPILLARY
Glucose-Capillary: 107 mg/dL — ABNORMAL HIGH (ref 70–99)
Glucose-Capillary: 134 mg/dL — ABNORMAL HIGH (ref 70–99)
Glucose-Capillary: 135 mg/dL — ABNORMAL HIGH (ref 70–99)
Glucose-Capillary: 142 mg/dL — ABNORMAL HIGH (ref 70–99)
Glucose-Capillary: 154 mg/dL — ABNORMAL HIGH (ref 70–99)
Glucose-Capillary: 168 mg/dL — ABNORMAL HIGH (ref 70–99)
Glucose-Capillary: 96 mg/dL (ref 70–99)

## 2023-11-04 LAB — BASIC METABOLIC PANEL WITH GFR
Anion gap: 10 (ref 5–15)
BUN: 17 mg/dL (ref 6–20)
CO2: 30 mmol/L (ref 22–32)
Calcium: 9.3 mg/dL (ref 8.9–10.3)
Chloride: 98 mmol/L (ref 98–111)
Creatinine, Ser: 1.34 mg/dL — ABNORMAL HIGH (ref 0.61–1.24)
GFR, Estimated: >60 mL/min (ref >60)
Glucose, Bld: 85 mg/dL (ref 70–99)
Potassium: 4.4 mmol/L (ref 3.5–5.1)
Sodium: 138 mmol/L (ref 135–145)

## 2023-11-04 LAB — CBC
HCT: 31.2 % — ABNORMAL LOW (ref 39.0–52.0)
Hemoglobin: 10.1 g/dL — ABNORMAL LOW (ref 13.0–17.0)
MCH: 29.4 pg (ref 26.0–34.0)
MCHC: 32.4 g/dL (ref 30.0–36.0)
MCV: 90.7 fL (ref 80.0–100.0)
Platelets: 294 10*3/uL (ref 150–400)
RBC: 3.44 MIL/uL — ABNORMAL LOW (ref 4.22–5.81)
RDW: 15.2 % (ref 11.5–15.5)
WBC: 4.7 10*3/uL (ref 4.0–10.5)
nRBC: 0 % (ref 0.0–0.2)

## 2023-11-04 NOTE — Progress Notes (Signed)
 Interval PCCM Note   S:  Capped since 4/20 Pt ready for trach to come out Tolerating thin liquids    O: Blood pressure 100/83, pulse 88, temperature 98.1 F (36.7 C), temperature source Oral, resp. rate 18, height 6' (1.829 m), weight 112.4 kg, SpO2 97%.  General:  NAD sitting in bedside recliner HEENT: midline cuffless trach- capped, strong phonation Neuro: Alert and oriented, MAE PULM:  non labored, clear, RA, strong cough GI: soft, bs+, PEG    A:  Aspiration PNA, CVA s/p trach improving Dysphagia - ENT trach 3/31 by Dr. Donalee Fruits - ATC 4/4, changed to cuffless 6 shiley 4/14, capped 4/20 - s/p peg 4/18   P:  - pt tolerating capping trial since 4/20.  Spoke with Dr. Donalee Fruits, who is in agreement for decannulation - ongoing SLP efforts - cont aspiration precautions    PCCM will see again 4/25 post trach decannulation      Early Glisson, MSN, AG-ACNP-BC [facility] 11/04/2023, 1:23 PM  See Amion for pager If no response to pager , please call [phone] until 7pm After 7:00 pm call Elink  [phone]

## 2023-11-04 NOTE — Progress Notes (Signed)
 Physical Therapy Treatment Patient Details Name: Jeremiah Stevens MRN: [id number] DOB: [birth year] Today's Date: 11/04/2023   History of Present Illness 48 yo pt admitted to [facility] 09/26/23 for difficulty swallowing. 3/19 PEA, ROSC after 2 min, then bradycardic arrest 1 min later, CPR resumed, pt intubated, and ROSC 6 min later. Seizure-like activity also noted briefly after ROSC. 3/20 pt transferred to [facility]. 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 3/21 self extubated and reintubated. 3/31 trach. Tracheostomy changed to #6 cuffless 4/14. S/p PEG 4/18. PMHx: HIV/AIDS, HTN, herpes, migraine, depression, DM II, polysubstance use, and seizure.    PT Comments  Pt making excellent progress towards his physical therapy goals. Ambulated both with RW and with no AD with overall steady pace and supervision for safety. Reports RW is still beneficial for energy conservation for longer distances. Pt negotiated 3 steps with a rail to simulate home environment. In light of progress, updated d/c plan to OPPT.    If plan is discharge home, recommend the following: Assistance with cooking/housework;Assist for transportation;Help with stairs or ramp for entrance   Can travel by private vehicle     Yes  Equipment Recommendations  Rolling walker (2 wheels)    Recommendations for Other Services       Precautions / Restrictions Precautions Precautions: Fall;Other (comment) Recall of Precautions/Restrictions: Intact Precaution/Restrictions Comments: PEG Required Braces or Orthoses: Other Brace Other Brace: Abdominal binder for PEG Restrictions Weight Bearing Restrictions Per Provider Order: No     Mobility  Bed Mobility               General bed mobility comments: OOB in chair    Transfers Overall transfer level: Modified independent Equipment used: None                    Ambulation/Gait Ambulation/Gait assistance: Supervision Gait Distance  (Feet): 400 Feet Assistive device: Rolling walker (2 wheels), None Gait Pattern/deviations: Step-through pattern, Decreased stride length, Trunk flexed Gait velocity: decreased     General Gait Details: Steady pace with using RW. Ambulated short distances with no AD vs rail and overall no LOB.   Stairs Stairs: Yes Stairs assistance: Contact guard assist Stair Management: One rail Right Number of Stairs: 3 General stair comments: cues for step by step   Wheelchair Mobility     Tilt Bed    Modified Rankin (Stroke Patients Only) Modified Rankin (Stroke Patients Only) Pre-Morbid Rankin Score: No symptoms Modified Rankin: Moderately severe disability     Balance Overall balance assessment: Needs assistance Sitting-balance support: Feet supported Sitting balance-Leahy Scale: Good     Standing balance support: No upper extremity supported, During functional activity Standing balance-Leahy Scale: Fair                              Communication    Cognition Arousal: Alert Behavior During Therapy: WFL for tasks assessed/performed   PT - Cognitive impairments: No apparent impairments                         Following commands: Intact      Cueing    Exercises Other Exercises Other Exercises: Obstacle negotiation with RW    General Comments        Pertinent Vitals/Pain Pain Assessment Pain Assessment: No/denies pain    Home Living  Prior Function            PT Goals (current goals can now be found in the care plan section) Acute Rehab PT Goals Patient Stated Goal: Return Home Potential to Achieve Goals: Good Progress towards PT goals: Progressing toward goals    Frequency    Min 2X/week      PT Plan      Co-evaluation              AM-PAC PT "6 Clicks" Mobility   Outcome Measure  Help needed turning from your back to your side while in a flat bed without using bedrails?:  None Help needed moving from lying on your back to sitting on the side of a flat bed without using bedrails?: None Help needed moving to and from a bed to a chair (including a wheelchair)?: None Help needed standing up from a chair using your arms (e.g., wheelchair or bedside chair)?: None Help needed to walk in hospital room?: A Little Help needed climbing 3-5 steps with a railing? : A Little 6 Click Score: 22    End of Session Equipment Utilized During Treatment: Gait belt Activity Tolerance: Patient tolerated treatment well Patient left: in chair;with call bell/phone within reach   PT Visit Diagnosis: Unsteadiness on feet (R26.81);Muscle weakness (generalized) (M62.81);Other abnormalities of gait and mobility (R26.89);Difficulty in walking, not elsewhere classified (R26.2);Other symptoms and signs involving the nervous system (R29.898) Hemiplegia - Right/Left: Right Hemiplegia - dominant/non-dominant: Non-dominant Hemiplegia - caused by: Cerebral infarction     Time: 3016-0109 PT Time Calculation (min) (ACUTE ONLY): 14 min  Charges:    $Therapeutic Activity: 8-22 mins PT General Charges $$ ACUTE PT VISIT: 1 Visit                     Verdia Glad, PT, DPT Acute Rehabilitation Services Office [phone]    Claria Crofts 11/04/2023, 4:24 PM

## 2023-11-04 NOTE — Progress Notes (Signed)
 Occupational Therapy Treatment Patient Details Name: Jeremiah Stevens MRN: [id number] DOB: August 31, 1975 Today's Date: 11/04/2023   History of present illness 48 yo pt admitted to [facility] 09/26/23 for difficulty swallowing. 3/19 PEA, ROSC after 2 min, then bradycardic arrest 1 min later, CPR resumed, pt intubated, and ROSC 6 min later. Seizure-like activity also noted briefly after ROSC. 3/20 pt transferred to [facility]. 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 3/21 self extubated and reintubated. 3/31 trach. Tracheostomy changed to #6 cuffless 4/14. S/p PEG 4/18. PMHx: HIV/AIDS, HTN, herpes, migraine, depression, DM II, polysubstance use, and seizure.   OT comments  Pt at this time was OOB and agreeable to session. At this time completed light hygiene while sitting at sink post set up. Pt however requires supervision to CGA with ambulation with RW as noted decrease safety with line management. She reported at this time they would like to go home with family support and HH therapies.       If plan is discharge home, recommend the following:  A little help with walking and/or transfers;A little help with bathing/dressing/bathroom;Assist for transportation;Help with stairs or ramp for entrance;Supervision due to cognitive status   Equipment Recommendations   (RW)    Recommendations for Other Services      Precautions / Restrictions Precautions Precautions: Fall;Other (comment) Recall of Precautions/Restrictions: Intact Precaution/Restrictions Comments: trach, PMV, PEG tube Required Braces or Orthoses: Other Brace Other Brace: Abdominal binder for PEG Restrictions Weight Bearing Restrictions Per Provider Order: No       Mobility Bed Mobility               General bed mobility comments: presented in chair    Transfers Overall transfer level: Needs assistance Equipment used: Rolling walker (2 wheels) Transfers: Sit to/from Stand Sit to Stand:  Supervision                 Balance Overall balance assessment: Needs assistance Sitting-balance support: Feet supported Sitting balance-Leahy Scale: Good     Standing balance support: Bilateral upper extremity supported, No upper extremity supported Standing balance-Leahy Scale: Fair Standing balance comment: attempted to caryy walker occ                           ADL either performed or assessed with clinical judgement   ADL Overall ADL's : Needs assistance/impaired Eating/Feeding: Supervision/ safety;Sitting   Grooming: Set up;Sitting   Upper Body Bathing: Contact guard assist;Sitting   Lower Body Bathing: Contact guard assist   Upper Body Dressing : Contact guard assist;Sitting   Lower Body Dressing: Contact guard assist;Cueing for safety;Cueing for sequencing   Toilet Transfer: Supervision/safety;Contact guard assist;Cueing for sequencing;Cueing for safety   Toileting- Clothing Manipulation and Hygiene: Contact guard assist;Sit to/from stand       Functional mobility during ADLs: Contact guard assist;Rolling walker (2 wheels)      Extremity/Trunk Assessment Upper Extremity Assessment Upper Extremity Assessment: Generalized weakness   Lower Extremity Assessment Lower Extremity Assessment: Defer to PT evaluation        Vision   Vision Assessment?: No apparent visual deficits   Perception Perception Perception: Within Functional Limits   Praxis Praxis Praxis: WFL   Communication Communication Communication: Impaired Factors Affecting Communication: Trach/intubated;Passey - Muir valve   Cognition Arousal: Alert Behavior During Therapy: WFL for tasks assessed/performed Cognition: Difficult to assess Difficult to assess due to: Intubated Orientation impairments: Person, Place, Situation Awareness: Intellectual awareness intact, Online awareness  impaired   Attention impairment (select first level of impairment): Selective  attention Executive functioning impairment (select all impairments): Organization OT - Cognition Comments: speaking appropriately with PSMV                 Following commands: Intact Following commands impaired: Follows multi-step commands inconsistently      Cueing   Cueing Techniques: Verbal cues  Exercises      Shoulder Instructions       General Comments      Pertinent Vitals/ Pain       Pain Assessment Pain Assessment: No/denies pain  Home Living                                          Prior Functioning/Environment              Frequency  Min 2X/week        Progress Toward Goals  OT Goals(current goals can now be found in the care plan section)  Progress towards OT goals: Progressing toward goals  Acute Rehab OT Goals Patient Stated Goal: to go home OT Goal Formulation: With patient Time For Goal Achievement: 11/24/23 Potential to Achieve Goals: Good ADL Goals Pt Will Perform Upper Body Dressing: sitting;with min assist Pt Will Perform Lower Body Dressing: sit to/from stand;sitting/lateral leans;with min assist Pt Will Transfer to Toilet: with +2 assist;with mod assist;stand pivot transfer;bedside commode Additional ADL Goal #1: pt will complete HEP R UE min (A) written  Plan      Co-evaluation                 AM-PAC OT "6 Clicks" Daily Activity     Outcome Measure   Help from another person eating meals?: A Little Help from another person taking care of personal grooming?: A Little Help from another person toileting, which includes using toliet, bedpan, or urinal?: A Little Help from another person bathing (including washing, rinsing, drying)?: A Little Help from another person to put on and taking off regular upper body clothing?: A Little Help from another person to put on and taking off regular lower body clothing?: A Little 6 Click Score: 18    End of Session Equipment Utilized During Treatment: Rolling  walker (2 wheels)  OT Visit Diagnosis: Unsteadiness on feet (R26.81);Muscle weakness (generalized) (M62.81) Symptoms and signs involving cognitive functions: Cerebral infarction   Activity Tolerance Patient tolerated treatment well   Patient Left in chair;with call bell/phone within reach;with nursing/sitter in room   Nurse Communication Mobility status        Time: 1610-9604 OT Time Calculation (min): 41 min  Charges: OT General Charges $OT Visit: 1 Visit OT Treatments $Self Care/Home Management : 38-52 mins  Erving Heather OTR/L  Acute Rehab Services  [phone] office number   Stevphen Elders 11/04/2023, 9:12 AM

## 2023-11-04 NOTE — Progress Notes (Signed)
 Pts trach removed at this time per physician order. No sores noted to the neck or surrounding the stoma site. Stoma covered with gauze and paper tape. Pt tolerated procedure well. RT will continue to monitor and be available as needed.

## 2023-11-04 NOTE — Plan of Care (Signed)
 Problem: Health Behavior/Discharge Planning: Goal: Ability to identify and utilize available resources and services will improve Outcome: Progressing   Problem: Health Behavior/Discharge Planning: Goal: Ability to identify and utilize available resources and services will improve Outcome: Progressing Goal: Ability to manage health-related needs will improve Outcome: Progressing   Problem: Metabolic: Goal: Ability to maintain appropriate glucose levels will improve Outcome: Progressing   Problem: Nutritional: Goal: Maintenance of adequate nutrition will improve Outcome: Progressing Goal: Progress toward achieving an optimal weight will improve Outcome: Progressing   Problem: Skin Integrity: Goal: Risk for impaired skin integrity will decrease Outcome: Progressing   Problem: Tissue Perfusion: Goal: Adequacy of tissue perfusion will improve Outcome: Progressing   Problem: Education: Goal: Knowledge of General Education information will improve Description: Including pain rating scale, medication(s)/side effects and non-pharmacologic comfort measures Outcome: Progressing   Problem: Health Behavior/Discharge Planning: Goal: Ability to manage health-related needs will improve Outcome: Progressing   Problem: Clinical Measurements: Goal: Ability to maintain clinical measurements within normal limits will improve Outcome: Progressing Goal: Will remain free from infection Outcome: Progressing Goal: Diagnostic test results will improve Outcome: Progressing Goal: Respiratory complications will improve Outcome: Progressing Goal: Cardiovascular complication will be avoided Outcome: Progressing   Problem: Activity: Goal: Risk for activity intolerance will decrease Outcome: Progressing   Problem: Nutrition: Goal: Adequate nutrition will be maintained Outcome: Progressing   Problem: Coping: Goal: Level of anxiety will decrease Outcome: Progressing   Problem:  Elimination: Goal: Will not experience complications related to bowel motility Outcome: Progressing Goal: Will not experience complications related to urinary retention Outcome: Progressing   Problem: Pain Managment: Goal: General experience of comfort will improve and/or be controlled Outcome: Progressing   Problem: Safety: Goal: Ability to remain free from injury will improve Outcome: Progressing   Problem: Skin Integrity: Goal: Risk for impaired skin integrity will decrease Outcome: Progressing   Problem: Activity: Goal: Ability to tolerate increased activity will improve Outcome: Progressing   Problem: Clinical Measurements: Goal: Ability to maintain a body temperature in the normal range will improve Outcome: Progressing   Problem: Respiratory: Goal: Ability to maintain adequate ventilation will improve Outcome: Progressing Goal: Ability to maintain a clear airway will improve Outcome: Progressing   Problem: Education: Goal: Knowledge of disease or condition will improve Outcome: Progressing Goal: Knowledge of secondary prevention will improve (MUST DOCUMENT ALL) Outcome: Progressing Goal: Knowledge of patient specific risk factors will improve (DELETE if not current risk factor) Outcome: Progressing   Problem: Ischemic Stroke/TIA Tissue Perfusion: Goal: Complications of ischemic stroke/TIA will be minimized Outcome: Progressing   Problem: Coping: Goal: Will verbalize positive feelings about self Outcome: Progressing Goal: Will identify appropriate support needs Outcome: Progressing   Problem: Health Behavior/Discharge Planning: Goal: Ability to manage health-related needs will improve Outcome: Progressing Goal: Goals will be collaboratively established with patient/family Outcome: Progressing   Problem: Self-Care: Goal: Ability to participate in self-care as condition permits will improve Outcome: Progressing Goal: Verbalization of feelings and  concerns over difficulty with self-care will improve Outcome: Progressing Goal: Ability to communicate needs accurately will improve Outcome: Progressing   Problem: Nutrition: Goal: Risk of aspiration will decrease Outcome: Progressing Goal: Dietary intake will improve Outcome: Progressing   Problem: Activity: Goal: Ability to tolerate increased activity will improve Outcome: Progressing   Problem: Respiratory: Goal: Ability to maintain a clear airway and adequate ventilation will improve Outcome: Progressing   Problem: Role Relationship: Goal: Method of communication will improve Outcome: Progressing   Problem: Nutrition: Goal: Dietary intake will improve Outcome: Progressing

## 2023-11-04 NOTE — Progress Notes (Signed)
 Speech Language Pathology Treatment: Dysphagia  Patient Details Name: Jeremiah Stevens MRN: [id number] DOB: 21-Jul-1975 Today's Date: 11/04/2023 Time: 2440-1027 SLP Time Calculation (min) (ACUTE ONLY): 16 min  Assessment / Plan / Recommendation Clinical Impression  Pt was seen for dysphagia tx focused on reinforcing swallow strategies. Pt notes that she has been using the strategies frequently. Pt was observed with thin-liquids, exhibiting immediate coughing. Compared to previous session, wet vocal quality was not observed during this session. Pt utilized swallow strategies (super-supraglottic swallow) with 95% consistency only requiring min verbal cues to increase effort of breath hold. Pt demonstrates excellent retention of swallow strategies. Pt experienced a single event of taking a faster and bigger sip, which lead to increased coughing; Outside of this, pt utilized small sips.   SLP will f/u to continue reinforcement of swallow strategies and potentially trial upgraded diet textures.    HPI HPI: Jeremiah Stevens is a 48 y.o. adult who identifies as male, admitted 09/26/23 with dysphagia x 3 days along with headaches and drooling. She was found to be hypoxic to 80s on room air. CTA showed multifocal PNA.  3/18 she had right facial droop, right sided weakness, dysarthria. MRI demonstrated acute right medullary infarct. Despite ongoing dysphagia (MBS showed moderately severe pharyngeal phase dysphagia and imaging showed unilateral bulging on right), pt insisted on eating. She reportedly ordered pizza overnight 3/18 and certainly aspirated, desaturated, ultimately suffered PEA arrest, 9 minutes ACLS, copious food material and emesis in oropharynx. Self extubated 3/21, reintubated and extubated 3/26, Trach on 3/31, ATC on 4/4.      SLP Plan  Continue with current plan of care      Recommendations for follow up therapy are one component of a multi-disciplinary discharge planning process,  led by the attending physician.  Recommendations may be updated based on patient status, additional functional criteria and insurance authorization.    Recommendations  Compensations: Slow rate;Small sips/bites;Clear throat after each swallow;Follow solids with liquid;Multiple dry swallows after each bite/sip;Minimize environmental distractions;Other (Comment) (Utilize hard breath hold during swallowing.) Postural Changes and/or Swallow Maneuvers: Seated upright 90 degrees;Upright 30-60 min after meal                  Oral care QID;Oral care before and after PO   Frequent or constant Supervision/Assistance Dysphagia, pharyngeal phase (R13.13)     Continue with current plan of care     Aurelia Leeks  11/04/2023, 2:31 PM

## 2023-11-04 NOTE — Progress Notes (Signed)
 PROGRESS NOTE        PATIENT DETAILS Name: Jeremiah Stevens Age: 48 y.o. Sex: adult Date of Birth: 05-13-1976 Admit Date: 09/26/2023 Admitting Physician Arlina Lair, MD ION:[id number], Adah Acron, MD  Brief Summary: 48 y.o. adult who with medical of DM2, seizures, HIV AIDS untreated, cryptococcal disease, obesity, polysubstance abuse, prior CVA who was admitted on 3/16 with dysphagia/drooling- found to be hypoxic-further evaluation revealed multifocal PNA.  Patient was started on antibiotics and admitted to the hospitalist service-Hospital course complicated by acute CVA-despite ongoing dysphagia-patient was noncompliant with diet (threatening to leave AMA while stroke workup ongoing)-reportedly ordered a pizza and had an aspiration event-with worsening hypoxemia-requiring transfer to the ICU where he had a PEA arrest-requiring 9 minutes of ACLS prior to ROSC.  During intubation-copious amounts of food material/emesis was observed in the oropharynx.  Patient had seizure-like activity after intubation.  He was subsequently managed in the ICU-was difficult to wean off the ventilator-he self extubated but had to be reintubated-ultimately ENT performed tracheostomy on 3/31.  He was transferred back to [facility] service on 4/7.  Significant events: 3/16 admit 3/18 neuro consult, found to have acute CVA 3/19 Ongoing aspiration despite obvious dysphagia, was planning to leave AMA but sister talked pt into staying. then had a massive aspiration event leading to worse hypoxia and PEA arrest. Txf to [facility] 3/20 PSV cumulative total of 5-6 hrs, following some commands/ purposefully 3/21 self extubated, was reintubated 3/23 Failed SBT due to WOB 3/24 Failed SBT due to apnea 3/25 Tolerated PS >4 hours 3/26: Intubated. 3/31: S/p tracheostomy with ENT. 4/4: trach collar starting 1200  4/5 cont on trach collar  4/7: Care transferred from [facility] to [facility] 4/8: Modified barium swallow.  Failed.   NPO. 4/19: PEG tube insertion by IR  Significant studies: 3/15>> CT head: No acute intracranial abnormality. 3/16>> CTA chest: No PE 3/19>> MRI brain: Acute right medullary infarct. 3/19>> CT angio head/neck: No LVO 3/19>> LDL: 95 3/19>> A1c: 10.8 3/19>> echo: EF 60-65%, grade 1 diastolic dysfunction. 3/20-3/21>> LTM EEG: No seizures. 4/12>> CT abdomen: Gastric anatomy is borderline for percutaneous gastrostomy tube placement. 4/17>> CTA chest: No PE. 4/17>> B/L lower extremity Doppler: No DVT.  Significant microbiology data: 3/16>> COVID/influenza/RSV PCR: Negative 3/16>> blood culture: No growth 3/17>> sputum culture: Normal respiratory flora 3/19>> tracheal aspirate: No organisms.   3/20>> sputum culture: Normal respiratory flora 3/27>> blood culture: No growth 4/01>> tracheal aspirate: Normal respiratory flora 4/02>> blood culture: No growth  Procedures: 4/18>> PEG tube by IR.  Consults: Neurology PCCM  Subjective: Lying comfortably in bed-no major issues overnight.  Objective: Vitals: Blood pressure (!) 144/88, pulse 91, temperature 97.7 F (36.5 C), temperature source Oral, resp. rate 18, height 6' (1.829 m), weight 112.4 kg, SpO2 97%.   Exam: Gen Exam:Alert awake-not in any distress HEENT:atraumatic, normocephalic Chest: B/L clear to auscultation anteriorly CVS:S1S2 regular Abdomen:soft non tender, non distended Extremities:no edema Neurology: Non focal-generalized weakness. Skin: no rash  Pertinent Labs/Radiology:    Latest Ref Rng & Units 11/04/2023    3:16 AM 11/02/2023    7:27 AM 10/30/2023    5:12 AM  CBC  WBC 4.0 - 10.5 K/uL 4.7  4.2  4.6   Hemoglobin 13.0 - 17.0 g/dL 41.3  24.4  01.0   Hematocrit 39.0 - 52.0 % 31.2  31.4  31.2   Platelets 150 -  400 K/uL 294  274  333     Lab Results  Component Value Date   NA 138 11/04/2023   K 4.4 11/04/2023   CL 98 11/04/2023   CO2 30 11/04/2023      Assessment/Plan: Acute hypoxic respiratory  failure secondary to multifocal pneumonia and subsequently an aspiration event Overall stable-weaned off vent-required tracheostomy on 3/31 by ENT Trach management per PCCM-decannulation being contemplated. Has completed a course of antibiotics.  PEA arrest Secondary to aspiration event/possible seizures Telemetry monitoring Echo stable No significant anoxic issues-follows commands-moving all 4 extremities.  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 Continue statin  Dysphagia Likely oral pharyngeal dysphagia in the setting of CVA. Failed MBS-PEG inserted by IR on 4/5:18 days of Plavix  washout Overall slowly improving-SLP following-full liquid diet which-patient is tolerating.  AKI Likely hemodynamically mediated Resolved.  Chronic HFpEF Euvolemic  Chest pain Elevated D-dimer No further chest pain CTA chest/Doppler study negative for VTE. On SQ Lovenox  prophylactic dose.  HTN BP stable Continue metoprolol   HIV/AIDS Continue antiretrovirals On Mepron  for PJP prophylaxis Previously noncompliant to medications.  History of cryptococcal antigenemia  On fluconazole .  Normocytic anemia Due to critical illness/chronic disease Hb stable-follow periodically.  DM-2 (A1c 10.8 on 3/19) CBGs stable Semglee  15 units twice daily+ 8 units of NovoLog  4 times daily+SSI  Recent Labs    11/04/23 0017 11/04/23 0359 11/04/23 0900  GLUCAP 107* 134* 142*    GERD Pepcid   Anxiety/depression Stable Continue Paxil  and Seroquel .  Medical noncompliance Previously noncompliant to ART's Has been noncompliant with diet-had a significant aspiration event. Per prior notes-has pulled out feeding tube-has attempted to pull a tracheotomy-prior MD's has explained the risk of sudden  aspiration/respiratory arrest/death.  Nutrition Status: Nutrition Problem: Increased nutrient needs Etiology: chronic illness Signs/Symptoms: estimated needs Interventions: Tube feeding, Prostat  Class 1 Obesity: Estimated body mass index is 33.61 kg/m as calculated from the following:   Height as of this encounter: 6' (1.829 m).   Weight as of this encounter: 112.4 kg.   Code status:   Code Status: Full Code   DVT Prophylaxis: SQ Lovenox    Family Communication: None at bedside   Disposition Plan: Status is: Inpatient Remains inpatient appropriate because: Severity of illness   Planned Discharge Destination:Skilled nursing facility   Diet: Diet Order             Diet full liquid Room service appropriate? Yes with Assist; Fluid consistency: Thin  Diet effective now                     Antimicrobial agents: Anti-infectives (From admission, onward)    Start     Dose/Rate Route Frequency Ordered Stop   10/29/23 1200  fluconazole  (DIFLUCAN ) 40 MG/ML suspension 400 mg  Status:  Discontinued        400 mg Per Tube  Once 10/29/23 1106 10/29/23 1217   10/29/23 0830  vancomycin  (VANCOCIN ) IVPB 1000 mg/200 mL premix        over 60 Minutes Intravenous Continuous PRN 10/29/23 0838 10/29/23 0830   10/29/23 0000  vancomycin  (VANCOREADY) IVPB 1500 mg/300 mL        1,500 mg 150 mL/hr over 120 Minutes Intravenous To Radiology 10/26/23 1559 10/29/23 0325   10/15/23 1000  emtricitabine -tenofovir  (TRUVADA ) 200-300 MG per tablet 1 tablet  1 tablet Per Tube Daily 10/15/23 0836     10/15/23 0900  ceFEPIme (MAXIPIME) 2 g in sodium chloride  0.9 % 100 mL IVPB        2 g 200 mL/hr over 30 Minutes Intravenous Every 8 hours 10/15/23 0846 10/22/23 0959   10/14/23 1000  emtricitabine -tenofovir  (TRUVADA) 200-300 MG per tablet 1 tablet  Status:  Discontinued        1 tablet Oral Daily 10/13/23 1340 10/15/23 0836   10/13/23 0100  ceFEPIme (MAXIPIME) 2 g in sodium chloride  0.9 % 100  mL IVPB  Status:  Discontinued        2 g 200 mL/hr over 30 Minutes Intravenous Every 8 hours 10/12/23 2352 10/15/23 0846   10/13/23 0100  metroNIDAZOLE (FLAGYL) IVPB 500 mg  Status:  Discontinued        500 mg 100 mL/hr over 60 Minutes Intravenous Every 12 hours 10/12/23 2352 10/15/23 0846   10/09/23 1200  atovaquone (MEPRON) 750 MG/5ML suspension 1,500 mg        1,500 mg Per Tube Daily with lunch 10/09/23 1025     10/09/23 1115  fluconazole  (DIFLUCAN ) tablet 400 mg        400 mg Per Tube Daily 10/09/23 1025     10/03/23 1100  clindamycin (CLEOCIN) IVPB 900 mg  Status:  Discontinued        900 mg 100 mL/hr over 30 Minutes Intravenous Every 8 hours 10/03/23 1014 10/09/23 1025   10/03/23 1100  primaquine tablet 30 mg  Status:  Discontinued        30 mg Per NG tube Daily 10/03/23 1014 10/09/23 1025   10/02/23 2200  metroNIDAZOLE (FLAGYL) tablet 500 mg  Status:  Discontinued        500 mg Per Tube Every 12 hours 10/02/23 1140 10/03/23 1014   09/30/23 1030  dolutegravir (TIVICAY) tablet 50 mg        50 mg Per Tube Daily 09/30/23 0931     09/30/23 1030  emtricitabine -tenofovir  AF (DESCOVY) 200-25 MG per tablet 1 tablet  Status:  Discontinued        1 tablet Per Tube Daily 09/30/23 0931 10/13/23 1340   09/30/23 1000  sulfamethoxazole -trimethoprim  (BACTRIM  DS) 800-160 MG per tablet 1 tablet  Status:  Discontinued        1 tablet Per Tube Daily 09/29/23 1637 10/03/23 1014   09/29/23 1800  metroNIDAZOLE (FLAGYL) IVPB 500 mg  Status:  Discontinued        500 mg 100 mL/hr over 60 Minutes Intravenous Every 12 hours 09/29/23 1716 10/02/23 1140   09/29/23 1400  cefTRIAXone (ROCEPHIN) 2 g in sodium chloride  0.9 % 100 mL IVPB        2 g 200 mL/hr over 30 Minutes Intravenous Every 24 hours 09/29/23 1317 10/02/23 1420   09/28/23 1445  fluconazole  (DIFLUCAN ) IVPB 400 mg  Status:  Discontinued        400 mg 100 mL/hr over 120 Minutes Intravenous Daily 09/28/23 1359 10/09/23 1025   09/27/23 1600   cefTRIAXone (ROCEPHIN) 2 g in sodium chloride  0.9 % 100 mL IVPB  Status:  Discontinued        2 g 200 mL/hr over 30 Minutes Intravenous Daily 09/27/23 1004 09/29/23 1317   09/27/23 1000  azithromycin  (ZITHROMAX ) tablet 500 mg  Status:  Discontinued        500 mg Oral Daily 09/26/23 2307 09/27/23 1004   09/27/23 1000  bictegravir-emtricitabine -tenofovir  AF (BIKTARVY ) 50-200-25 MG per tablet  1 tablet  Status:  Discontinued        1 tablet Oral Daily 09/27/23 0004 09/30/23 0931   09/27/23 1000  sulfamethoxazole -trimethoprim  (BACTRIM  DS) 800-160 MG per tablet 1 tablet  Status:  Discontinued        1 tablet Oral Daily 09/27/23 0017 09/29/23 1637   09/27/23 0800  ceFEPIme (MAXIPIME) 2 g in sodium chloride  0.9 % 100 mL IVPB  Status:  Discontinued        2 g 200 mL/hr over 30 Minutes Intravenous Every 8 hours 09/26/23 2333 09/27/23 1004   09/27/23 0000  fluconazole  (DIFLUCAN ) tablet 400 mg  Status:  Discontinued        400 mg Oral Daily 09/26/23 2352 09/28/23 1359   09/26/23 2300  ceFEPIme (MAXIPIME) 2 g in sodium chloride  0.9 % 100 mL IVPB        2 g 200 mL/hr over 30 Minutes Intravenous  Once 09/26/23 2245 09/26/23 2357   09/26/23 2300  vancomycin (VANCOREADY) IVPB 2000 mg/400 mL        2,000 mg 200 mL/hr over 120 Minutes Intravenous  Once 09/26/23 2245 09/27/23 0305        MEDICATIONS: Scheduled Meds:  aspirin  81 mg Per Tube Daily   atorvastatin  20 mg Per Tube QHS   atovaquone  1,500 mg Per Tube Q lunch   Chlorhexidine  Gluconate Cloth  6 each Topical Daily   clopidogrel  75 mg Per Tube Daily   dolutegravir  50 mg Per Tube Daily   emtricitabine -tenofovir   1 tablet Per Tube Daily   enoxaparin  (LOVENOX ) injection  50 mg Subcutaneous Q24H   famotidine  20 mg Per Tube BID   feeding supplement (PROSource TF20)  60 mL Per Tube Daily   fiber supplement (BANATROL TF)  60 mL Per Tube BID   fluconazole   400 mg Per Tube Daily   free water  100 mL Per Tube Q4H   Gerhardt's butt cream    Topical BID   insulin  aspart  0-20 Units Subcutaneous Q4H   insulin  aspart  8 Units Subcutaneous 4 times per day   insulin  glargine-yfgn  15 Units Subcutaneous BID   metoprolol tartrate  50 mg Per Tube BID   multivitamin with minerals  1 tablet Per Tube Daily   nicotine  14 mg Transdermal Daily   pantoprazole (PROTONIX) IV  40 mg Intravenous QHS   PARoxetine  20 mg Per Tube Daily   QUEtiapine  50 mg Per Tube QHS   Continuous Infusions:  dextrose  Stopped (10/23/23 0856)   feeding supplement (OSMOLITE 1.5 CAL) 45 mL/hr at 11/04/23 0543   PRN Meds:.acetaminophen  **OR** acetaminophen , alum & mag hydroxide-simeth, bisacodyl, dextrose , fentaNYL  (SUBLIMAZE ) injection, guaiFENesin-dextromethorphan, hydrALAZINE , hydrOXYzine, iohexol, labetalol , levalbuterol, loperamide HCl, melatonin, ondansetron **OR** ondansetron (ZOFRAN) IV, mouth rinse, oxyCODONE, polyethylene glycol, polyvinyl alcohol   I have personally reviewed following labs and imaging studies  LABORATORY DATA: CBC: Recent Labs  Lab 10/29/23 0509 10/30/23 0512 11/02/23 0727 11/04/23 0316  WBC 5.4 4.6 4.2 4.7  NEUTROABS 3.0 2.2  --   --   HGB 9.8* 10.1* 10.3* 10.1*  HCT 30.2* 31.2* 31.4* 31.2*  MCV 90.7 89.9 91.5 90.7  PLT 322 333 274 294    Basic Metabolic Panel: Recent Labs  Lab 10/29/23 0509 10/30/23 0512 11/02/23 0727 11/04/23 0316  NA 134* 132* 135 138  K 4.4 3.9 4.3 4.4  CL 101 96* 100 98  CO2 26 25 25 30   GLUCOSE 96 141* 119*  85  BUN 21* 14 18 17   CREATININE 1.46* 1.30* 1.23 1.34*  CALCIUM 9.0 9.0 9.2 9.3  MG 2.2  --   --   --   PHOS 2.2*  --   --   --     GFR: Estimated Creatinine Clearance (by C-G formula based on SCr of 1.34 mg/dL (H)) Male: 86.5 mL/min (A) Male: 88.2 mL/min (A)  Liver Function Tests: No results for input(s): "AST", "ALT", "ALKPHOS", "BILITOT", "PROT", "ALBUMIN" in the last 168 hours. No results for input(s): "LIPASE", "AMYLASE" in the last 168 hours. No results for input(s):  "AMMONIA" in the last 168 hours.  Coagulation Profile: Recent Labs  Lab 10/29/23 0509  INR 1.1    Cardiac Enzymes: No results for input(s): "CKTOTAL", "CKMB", "CKMBINDEX", "TROPONINI" in the last 168 hours.  BNP (last 3 results) No results for input(s): "PROBNP" in the last 8760 hours.  Lipid Profile: No results for input(s): "CHOL", "HDL", "LDLCALC", "TRIG", "CHOLHDL", "LDLDIRECT" in the last 72 hours.  Thyroid Function Tests: No results for input(s): "TSH", "T4TOTAL", "FREET4", "T3FREE", "THYROIDAB" in the last 72 hours.  Anemia Panel: No results for input(s): "VITAMINB12", "FOLATE", "FERRITIN", "TIBC", "IRON", "RETICCTPCT" in the last 72 hours.  Urine analysis:    Component Value Date/Time   COLORURINE YELLOW 09/25/2023 0052   APPEARANCEUR CLEAR 09/25/2023 0052   LABSPEC 1.028 09/25/2023 0052   PHURINE 5.0 09/25/2023 0052   GLUCOSEU >=500 (A) 09/25/2023 0052   HGBUR SMALL (A) 09/25/2023 0052   BILIRUBINUR NEGATIVE 09/25/2023 0052   KETONESUR NEGATIVE 09/25/2023 0052   PROTEINUR 30 (A) 09/25/2023 0052   UROBILINOGEN 0.2 10/08/2011 1145   NITRITE NEGATIVE 09/25/2023 0052   LEUKOCYTESUR NEGATIVE 09/25/2023 0052    Sepsis Labs: Lactic Acid, Venous    Component Value Date/Time   LATICACIDVEN 1.9 09/29/2023 1747    MICROBIOLOGY: No results found for this or any previous visit (from the past 240 hours).  RADIOLOGY STUDIES/RESULTS: No results found.   LOS: 39 days   Kimberly Penna, MD  [facility]    To contact the attending provider between 7A-7P or the covering provider during after hours 7P-7A, please log into the web site [email or web address] and access using universal [place] password for that web site. If you do not have the password, please call the hospital operator.  11/04/2023, 10:49 AM

## 2023-11-05 DIAGNOSIS — I10 Essential (primary) hypertension: Secondary | ICD-10-CM | POA: Diagnosis not present

## 2023-11-05 DIAGNOSIS — E1165 Type 2 diabetes mellitus with hyperglycemia: Secondary | ICD-10-CM | POA: Diagnosis not present

## 2023-11-05 DIAGNOSIS — J9601 Acute respiratory failure with hypoxia: Secondary | ICD-10-CM | POA: Diagnosis not present

## 2023-11-05 DIAGNOSIS — J690 Pneumonitis due to inhalation of food and vomit: Secondary | ICD-10-CM | POA: Diagnosis not present

## 2023-11-05 LAB — GLUCOSE, CAPILLARY
Glucose-Capillary: 138 mg/dL — ABNORMAL HIGH (ref 70–99)
Glucose-Capillary: 171 mg/dL — ABNORMAL HIGH (ref 70–99)
Glucose-Capillary: 134 mg/dL — ABNORMAL HIGH (ref 70–99)
Glucose-Capillary: 83 mg/dL (ref 70–99)
Glucose-Capillary: 139 mg/dL — ABNORMAL HIGH (ref 70–99)
Glucose-Capillary: 173 mg/dL — ABNORMAL HIGH (ref 70–99)

## 2023-11-05 MED ORDER — OSMOLITE 1.5 CAL PO LIQD
1000.0000 mL | ORAL | Status: DC
  Administered 2023-11-05 – 2023-11-08 (×4): 1000 mL
  Filled 2023-11-05 (×5): qty 1000

## 2023-11-05 NOTE — Progress Notes (Signed)
 Patient currently up in the chair, stoma site is covered with gauze. Tolerating well on room air at this time. RT will continue to monitor patient.

## 2023-11-05 NOTE — Progress Notes (Signed)
 Nutrition Follow-up  DOCUMENTATION CODES:   Obesity unspecified  INTERVENTION:  Modify current tube feeding regimen via PEG: Osmolite 1.5 @ 83ml/hr for 12 hours (985ml/day) from 6PM-6AM Ensure that tube feed is held 2 hours prior to Tivicay  and 1 hour after (from 8:30AM-11:30AM) Prosource TF20 60 ml 1x/d Free water  flushes 100mL q4 hours   Provides  1574 kcal (72% of estimated needs), 82 gm protein (75% of estimated needs), 1358 ml free water  daily (TF + FWF)   Continue MVI with minerals daily via tube  Continue Banatrol TF BID via tube  Monitor diet advancement and tolerance for ability to reduce/wean tube feeding regimen - currently on 18 hours of TF Continue Ensure Enlive po TID, each supplement provides 350 kcal and 20 grams of protein   NUTRITION DIAGNOSIS:  Increased nutrient needs related to chronic illness as evidenced by estimated needs. - remains applicable  GOAL:  Patient will meet greater than or equal to 90% of their needs - progressing  MONITOR:  TF tolerance  REASON FOR ASSESSMENT:   Consult Enteral/tube feeding initiation and management  ASSESSMENT:   48 y.o. adult with medical history significant of diabetes type 2, seizure disorder, HIV untreated, obesity, polysubstance abuse .  Patient is admitted for pneumonia with hypoxia in the setting of HIV.  Previous Admission 2/12 Admitted for metabolic encephalopathy 2/14 - Cortrak placed 2/15 - Cortrak removed, Passed swallow eval, carb modified diet  Current Admission 3/16 - Admitted to [facility] for PNA 3/18 - MBS showed chronic aspiration of secretions, no diet formally recommended, MRI showed acute CVA 3/19 - PEA arrest and intubation s/p aspiration event, txr to ICU 3/20 - transferred to [facility] for continuous EEG monitoring, trickles initiated 3/21 - cortrak tube placed, pt self-extubated, emergently reintubated 3/23 - SBT: failed 3/24 - SBT: failed 3/25 - tolerated PS >4 hours 3/26 -  intubated  3/29 - pt TF rate increased r/t feeling hungry 3/31 - tracheostomy placed in OR with ENT 4/4 - transitioned to trach collar  4/7 - txr to floor from ICU  4/8 - MBSS: Failed - NPO 4/9 - trach changed to #8 Shiley cuffed trach 4/14 - trach changed and reduced in size (#6 cuffless) 4/18 - PEG tube placement  4/22 - advanced to full liquid diet 4/24 - trach decannulated  Trach removed yesterday. Diet likely to be advanced s/p trach removal, but remains on full liquid diet. Recommend transitioning to nocturnal feedings to provide 75% of estimated needs to allow for patient appetite to improve during the day. If meeting needs sufficiently, will recommend transition to bolus feedings.  Admit Weight: 115.8kg Current Weight: 112.4kg Lowest Weight: 104.9kg on 3/29   No significant edema on exam. Generalized to bilateral upper and lower extremities. Adequate weight to frame observed. Wt trending back up toward admission weight. No new or significant skin breakdown.   Drains/Lines: LUQ: PEG (20Fr) placed on 4/18   Insulin  coverage modified. Re-initiated SSI 8 units QID.  Blood sugars stabilizing.    Meds: tivicay , famotidine , fluconazole , SSI 0-20 q4, SSI 8 QID, Semglee  15 BID, MVI, nyastatin, pantoprazole , paroxetine    Labs: reviewed and unremarkable    Diet Order:   Diet Order             Diet full liquid Room service appropriate? Yes with Assist; Fluid consistency: Thin  Diet effective now             EDUCATION NEEDS:   Education needs have been addressed  Skin:  Skin Assessment: Reviewed RN Assessment  Last BM:  4/24  Height:  Ht Readings from Last 1 Encounters:  10/07/23 6' (1.829 m)   Weight:  Wt Readings from Last 1 Encounters:  11/03/23 112.4 kg    Ideal Body Weight:  80.9 kg  BMI:  Body mass index is 33.61 kg/m.  Estimated Nutritional Needs:   Kcal:  2200-2500 kcal/d  Protein:  110-120g  Fluid:  2.4L/day  Con Decant MS, RD,  LDN Registered Dietitian Clinical Nutrition RD Inpatient Contact Info in Amion

## 2023-11-05 NOTE — Plan of Care (Signed)
  Problem: Health Behavior/Discharge Planning: Goal: Ability to identify and utilize available resources and services will improve Outcome: Progressing Goal: Ability to manage health-related needs will improve Outcome: Progressing   Problem: Metabolic: Goal: Ability to maintain appropriate glucose levels will improve Outcome: Progressing   Problem: Nutritional: Goal: Maintenance of adequate nutrition will improve Outcome: Progressing Goal: Progress toward achieving an optimal weight will improve Outcome: Progressing   Problem: Skin Integrity: Goal: Risk for impaired skin integrity will decrease Outcome: Progressing   Problem: Tissue Perfusion: Goal: Adequacy of tissue perfusion will improve Outcome: Progressing   Problem: Education: Goal: Knowledge of General Education information will improve Description: Including pain rating scale, medication(s)/side effects and non-pharmacologic comfort measures Outcome: Progressing   Problem: Health Behavior/Discharge Planning: Goal: Ability to manage health-related needs will improve Outcome: Progressing   Problem: Clinical Measurements: Goal: Ability to maintain clinical measurements within normal limits will improve Outcome: Progressing Goal: Will remain free from infection Outcome: Progressing Goal: Diagnostic test results will improve Outcome: Progressing Goal: Respiratory complications will improve Outcome: Progressing Goal: Cardiovascular complication will be avoided Outcome: Progressing   Problem: Activity: Goal: Risk for activity intolerance will decrease Outcome: Progressing   Problem: Nutrition: Goal: Adequate nutrition will be maintained Outcome: Progressing   Problem: Coping: Goal: Level of anxiety will decrease Outcome: Progressing   Problem: Elimination: Goal: Will not experience complications related to bowel motility Outcome: Progressing Goal: Will not experience complications related to urinary  retention Outcome: Progressing   Problem: Pain Managment: Goal: General experience of comfort will improve and/or be controlled Outcome: Progressing   Problem: Safety: Goal: Ability to remain free from injury will improve Outcome: Progressing   Problem: Skin Integrity: Goal: Risk for impaired skin integrity will decrease Outcome: Progressing   Problem: Activity: Goal: Ability to tolerate increased activity will improve Outcome: Progressing   Problem: Clinical Measurements: Goal: Ability to maintain a body temperature in the normal range will improve Outcome: Progressing   Problem: Respiratory: Goal: Ability to maintain adequate ventilation will improve Outcome: Progressing Goal: Ability to maintain a clear airway will improve Outcome: Progressing   Problem: Education: Goal: Knowledge of disease or condition will improve Outcome: Progressing Goal: Knowledge of secondary prevention will improve (MUST DOCUMENT ALL) Outcome: Progressing Goal: Knowledge of patient specific risk factors will improve (DELETE if not current risk factor) Outcome: Progressing   Problem: Ischemic Stroke/TIA Tissue Perfusion: Goal: Complications of ischemic stroke/TIA will be minimized Outcome: Progressing   Problem: Coping: Goal: Will verbalize positive feelings about self Outcome: Progressing Goal: Will identify appropriate support needs Outcome: Progressing   Problem: Health Behavior/Discharge Planning: Goal: Ability to manage health-related needs will improve Outcome: Progressing Goal: Goals will be collaboratively established with patient/family Outcome: Progressing   Problem: Self-Care: Goal: Ability to participate in self-care as condition permits will improve Outcome: Progressing Goal: Verbalization of feelings and concerns over difficulty with self-care will improve Outcome: Progressing Goal: Ability to communicate needs accurately will improve Outcome: Progressing   Problem:  Nutrition: Goal: Risk of aspiration will decrease Outcome: Progressing Goal: Dietary intake will improve Outcome: Progressing   Problem: Activity: Goal: Ability to tolerate increased activity will improve Outcome: Progressing   Problem: Respiratory: Goal: Ability to maintain a clear airway and adequate ventilation will improve Outcome: Progressing   Problem: Role Relationship: Goal: Method of communication will improve Outcome: Progressing   Problem: Nutrition: Goal: Dietary intake will improve Outcome: Progressing

## 2023-11-05 NOTE — Progress Notes (Signed)
 PROGRESS NOTE        PATIENT DETAILS Name: Jeremiah Stevens Age: 48 y.o. Sex: adult Date of Birth: 04/13/1976 Admit Date: 09/26/2023 Admitting Physician Arlina Lair, MD [id number], Adah Acron, MD  Brief Summary: 48 y.o. adult who with medical of DM2, seizures, HIV AIDS untreated, cryptococcal disease, obesity, polysubstance abuse, prior CVA who was admitted on 3/16 with dysphagia/drooling- found to be hypoxic-further evaluation revealed multifocal PNA.  Patient was started on antibiotics and admitted to the hospitalist service-Hospital course complicated by acute CVA-despite ongoing dysphagia-patient was noncompliant with diet (threatening to leave AMA while stroke workup ongoing)-reportedly ordered a pizza and had an aspiration event-with worsening hypoxemia-requiring transfer to the ICU where he had a PEA arrest-requiring 9 minutes of ACLS prior to ROSC.  During intubation-copious amounts of food material/emesis was observed in the oropharynx.  Patient had seizure-like activity after intubation.  He was subsequently managed in the ICU-was difficult to wean off the ventilator-he self extubated but had to be reintubated-ultimately ENT performed tracheostomy on 3/31.  He was transferred back to [facility] service on 4/7.  Significant events: 3/16 admit 3/18 neuro consult, found to have acute CVA 3/19 Ongoing aspiration despite obvious dysphagia, was planning to leave AMA but sister talked pt into staying. then had a massive aspiration event leading to worse hypoxia and PEA arrest. Txf to [facility] 3/20 PSV cumulative total of 5-6 hrs, following some commands/ purposefully 3/21 self extubated, was reintubated 3/23 Failed SBT due to WOB 3/24 Failed SBT due to apnea 3/25 Tolerated PS >4 hours 3/26: Intubated. 3/31: S/p tracheostomy with ENT. 4/4: trach collar starting 1200  4/5 cont on trach collar  4/7: Care transferred from [facility] to [facility] 4/8: Modified barium swallow.  Failed.   NPO. 4/19: PEG tube insertion by IR 4/24: Trach decannulated by PCCM  Significant studies: 3/15>> CT head: No acute intracranial abnormality. 3/16>> CTA chest: No PE 3/19>> MRI brain: Acute right medullary infarct. 3/19>> CT angio head/neck: No LVO 3/19>> LDL: 95 3/19>> A1c: 10.8 3/19>> echo: EF 60-65%, grade 1 diastolic dysfunction. 3/20-3/21>> LTM EEG: No seizures. 4/12>> CT abdomen: Gastric anatomy is borderline for percutaneous gastrostomy tube placement. 4/17>> CTA chest: No PE. 4/17>> B/L lower extremity Doppler: No DVT.  Significant microbiology data: 3/16>> COVID/influenza/RSV PCR: Negative 3/16>> blood culture: No growth 3/17>> sputum culture: Normal respiratory flora 3/19>> tracheal aspirate: No organisms.   3/20>> sputum culture: Normal respiratory flora 3/27>> blood culture: No growth 4/01>> tracheal aspirate: Normal respiratory flora 4/02>> blood culture: No growth  Procedures: 4/18>> PEG tube by IR.  Consults: Neurology PCCM  Subjective: Sitting in bedside chair-admitting-making needs a sweater.  No major issues overnight.  No chest pain or shortness of breath.  Appears very comfortable.  Objective: Vitals: Blood pressure 91/67, pulse 98, temperature 98 F (36.7 C), temperature source Oral, resp. rate 17, height 6' (1.829 m), weight 112.4 kg, SpO2 95%.   Exam: Gen Exam:Alert awake-not in any distress HEENT:atraumatic, normocephalic Chest: B/L clear to auscultation anteriorly CVS:S1S2 regular Abdomen:soft non tender, non distended Extremities:no edema Neurology: Non focal Skin: no rash  Pertinent Labs/Radiology:    Latest Ref Rng & Units 11/04/2023    3:16 AM 11/02/2023    7:27 AM 10/30/2023    5:12 AM  CBC  WBC 4.0 - 10.5 K/uL 4.7  4.2  4.6   Hemoglobin 13.0 - 17.0 g/dL 10.1  10.3  10.1   Hematocrit 39.0 - 52.0 % 31.2  31.4  31.2   Platelets 150 - 400 K/uL 294  274  333     Lab Results  Component Value Date   NA 138 11/04/2023   K 4.4  11/04/2023   CL 98 11/04/2023   CO2 30 11/04/2023      Assessment/Plan: Acute hypoxic respiratory failure secondary to multifocal pneumonia and subsequently an aspiration event Much better-weaned off the vent-required trach on 3/31-but subsequently decannulated on 8/24 Currently on room air Has completed a course of antibiotics.    PEA arrest Secondary to aspiration event/possible seizures Telemetry monitoring Echo stable No significant anoxic issues-follows commands-moving all 4 extremities-speech clear and fluent.  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 Continue statin  Dysphagia Likely oral pharyngeal dysphagia in the setting of CVA. Failed MBS-PEG inserted by IR on 4/5:18 days of Plavix washout Overall slowly improving-SLP following-full liquid diet which-patient is tolerating.  AKI Likely hemodynamically mediated Resolved.  Chronic HFpEF Euvolemic  Chest pain Elevated D-dimer No further chest pain CTA chest/Doppler study negative for VTE. On SQ Lovenox  prophylactic dose.  HTN BP stable Continue metoprolol  HIV/AIDS Continue antiretrovirals On Mepron for PJP prophylaxis Previously noncompliant to medications.  History of cryptococcal antigenemia  On fluconazole .  Normocytic anemia Due to critical illness/chronic disease Hb stable-follow periodically.  DM-2 (A1c 10.8 on 3/19) CBGs stable Semglee  15 units twice daily+ 8 units of NovoLog  4 times daily+SSI  Recent Labs    11/04/23 2248 11/05/23 0312 11/05/23 0830  GLUCAP 168* 138* 171*    GERD Pepcid  Anxiety/depression Stable Continue Paxil and Seroquel.  Medical noncompliance Previously noncompliant to ART's Has been noncompliant with diet-had a significant aspiration event. Per prior  notes-has pulled out feeding tube-has attempted to pull a tracheotomy-prior MD's has explained the risk of sudden aspiration/respiratory arrest/death.  Nutrition Status: Nutrition Problem: Increased nutrient needs Etiology: chronic illness Signs/Symptoms: estimated needs Interventions: Tube feeding, Prostat  Class 1 Obesity: Estimated body mass index is 33.61 kg/m as calculated from the following:   Height as of this encounter: 6' (1.829 m).   Weight as of this encounter: 112.4 kg.   Code status:   Code Status: Full Code   DVT Prophylaxis: SQ Lovenox    Family Communication: None at bedside   Disposition Plan: Status is: Inpatient Remains inpatient appropriate because: Severity of illness   Planned Discharge Destination:Skilled nursing facility   Diet: Diet Order             Diet full liquid Room service appropriate? Yes with Assist; Fluid consistency: Thin  Diet effective now                     Antimicrobial agents: Anti-infectives (From admission, onward)    Start     Dose/Rate Route Frequency Ordered Stop   10/29/23 1200  fluconazole  (DIFLUCAN ) 40 MG/ML suspension 400 mg  Status:  Discontinued        400 mg Per Tube  Once 10/29/23 1106 10/29/23 1217   10/29/23 0830  vancomycin (VANCOCIN) IVPB 1000 mg/200 mL premix        over 60 Minutes Intravenous Continuous PRN 10/29/23 0838 10/29/23 0830   10/29/23 0000  vancomycin (VANCOREADY) IVPB 1500 mg/300 mL        1,500 mg 150 mL/hr over 120  Minutes Intravenous To Radiology 10/26/23 1559 10/29/23 0325   10/15/23 1000  emtricitabine -tenofovir  (TRUVADA) 200-300 MG per tablet 1 tablet        1 tablet Per Tube Daily 10/15/23 0836     10/15/23 0900  ceFEPIme (MAXIPIME) 2 g in sodium chloride  0.9 % 100 mL IVPB        2 g 200 mL/hr over 30 Minutes Intravenous Every 8 hours 10/15/23 0846 10/22/23 0959   10/14/23 1000  emtricitabine -tenofovir  (TRUVADA) 200-300 MG per tablet 1 tablet  Status:  Discontinued        1  tablet Oral Daily 10/13/23 1340 10/15/23 0836   10/13/23 0100  ceFEPIme (MAXIPIME) 2 g in sodium chloride  0.9 % 100 mL IVPB  Status:  Discontinued        2 g 200 mL/hr over 30 Minutes Intravenous Every 8 hours 10/12/23 2352 10/15/23 0846   10/13/23 0100  metroNIDAZOLE (FLAGYL) IVPB 500 mg  Status:  Discontinued        500 mg 100 mL/hr over 60 Minutes Intravenous Every 12 hours 10/12/23 2352 10/15/23 0846   10/09/23 1200  atovaquone (MEPRON) 750 MG/5ML suspension 1,500 mg        1,500 mg Per Tube Daily with lunch 10/09/23 1025     10/09/23 1115  fluconazole  (DIFLUCAN ) tablet 400 mg        400 mg Per Tube Daily 10/09/23 1025     10/03/23 1100  clindamycin (CLEOCIN) IVPB 900 mg  Status:  Discontinued        900 mg 100 mL/hr over 30 Minutes Intravenous Every 8 hours 10/03/23 1014 10/09/23 1025   10/03/23 1100  primaquine tablet 30 mg  Status:  Discontinued        30 mg Per NG tube Daily 10/03/23 1014 10/09/23 1025   10/02/23 2200  metroNIDAZOLE (FLAGYL) tablet 500 mg  Status:  Discontinued        500 mg Per Tube Every 12 hours 10/02/23 1140 10/03/23 1014   09/30/23 1030  dolutegravir (TIVICAY) tablet 50 mg        50 mg Per Tube Daily 09/30/23 0931     09/30/23 1030  emtricitabine -tenofovir  AF (DESCOVY) 200-25 MG per tablet 1 tablet  Status:  Discontinued        1 tablet Per Tube Daily 09/30/23 0931 10/13/23 1340   09/30/23 1000  sulfamethoxazole -trimethoprim  (BACTRIM  DS) 800-160 MG per tablet 1 tablet  Status:  Discontinued        1 tablet Per Tube Daily 09/29/23 1637 10/03/23 1014   09/29/23 1800  metroNIDAZOLE (FLAGYL) IVPB 500 mg  Status:  Discontinued        500 mg 100 mL/hr over 60 Minutes Intravenous Every 12 hours 09/29/23 1716 10/02/23 1140   09/29/23 1400  cefTRIAXone (ROCEPHIN) 2 g in sodium chloride  0.9 % 100 mL IVPB        2 g 200 mL/hr over 30 Minutes Intravenous Every 24 hours 09/29/23 1317 10/02/23 1420   09/28/23 1445  fluconazole  (DIFLUCAN ) IVPB 400 mg  Status:   Discontinued        400 mg 100 mL/hr over 120 Minutes Intravenous Daily 09/28/23 1359 10/09/23 1025   09/27/23 1600  cefTRIAXone (ROCEPHIN) 2 g in sodium chloride  0.9 % 100 mL IVPB  Status:  Discontinued        2 g 200 mL/hr over 30 Minutes Intravenous Daily 09/27/23 1004 09/29/23 1317   09/27/23 1000  azithromycin  (ZITHROMAX ) tablet 500 mg  Status:  Discontinued        500 mg Oral Daily 09/26/23 2307 09/27/23 1004   09/27/23 1000  bictegravir-emtricitabine -tenofovir  AF (BIKTARVY ) 50-200-25 MG per tablet 1 tablet  Status:  Discontinued        1 tablet Oral Daily 09/27/23 0004 09/30/23 0931   09/27/23 1000  sulfamethoxazole -trimethoprim  (BACTRIM  DS) 800-160 MG per tablet 1 tablet  Status:  Discontinued        1 tablet Oral Daily 09/27/23 0017 09/29/23 1637   09/27/23 0800  ceFEPIme (MAXIPIME) 2 g in sodium chloride  0.9 % 100 mL IVPB  Status:  Discontinued        2 g 200 mL/hr over 30 Minutes Intravenous Every 8 hours 09/26/23 2333 09/27/23 1004   09/27/23 0000  fluconazole  (DIFLUCAN ) tablet 400 mg  Status:  Discontinued        400 mg Oral Daily 09/26/23 2352 09/28/23 1359   09/26/23 2300  ceFEPIme (MAXIPIME) 2 g in sodium chloride  0.9 % 100 mL IVPB        2 g 200 mL/hr over 30 Minutes Intravenous  Once 09/26/23 2245 09/26/23 2357   09/26/23 2300  vancomycin (VANCOREADY) IVPB 2000 mg/400 mL        2,000 mg 200 mL/hr over 120 Minutes Intravenous  Once 09/26/23 2245 09/27/23 0305        MEDICATIONS: Scheduled Meds:  aspirin  81 mg Per Tube Daily   atorvastatin  20 mg Per Tube QHS   atovaquone  1,500 mg Per Tube Q lunch   Chlorhexidine  Gluconate Cloth  6 each Topical Daily   clopidogrel  75 mg Per Tube Daily   dolutegravir  50 mg Per Tube Daily   emtricitabine -tenofovir   1 tablet Per Tube Daily   enoxaparin  (LOVENOX ) injection  50 mg Subcutaneous Q24H   famotidine  20 mg Per Tube BID   feeding supplement (PROSource TF20)  60 mL Per Tube Daily   fiber supplement (BANATROL TF)  60 mL  Per Tube BID   fluconazole   400 mg Per Tube Daily   free water  100 mL Per Tube Q4H   Gerhardt's butt cream   Topical BID   insulin  aspart  0-20 Units Subcutaneous Q4H   insulin  aspart  8 Units Subcutaneous 4 times per day   insulin  glargine-yfgn  15 Units Subcutaneous BID   metoprolol tartrate  50 mg Per Tube BID   multivitamin with minerals  1 tablet Per Tube Daily   nicotine  14 mg Transdermal Daily   pantoprazole (PROTONIX) IV  40 mg Intravenous QHS   PARoxetine  20 mg Per Tube Daily   QUEtiapine  50 mg Per Tube QHS   Continuous Infusions:  dextrose  Stopped (10/23/23 0856)   feeding supplement (OSMOLITE 1.5 CAL) 1,000 mL (11/04/23 1700)   PRN Meds:.acetaminophen  **OR** acetaminophen , alum & mag hydroxide-simeth, bisacodyl, dextrose , fentaNYL  (SUBLIMAZE ) injection, guaiFENesin-dextromethorphan, hydrALAZINE , hydrOXYzine, iohexol, labetalol , levalbuterol, loperamide HCl, melatonin, ondansetron **OR** ondansetron (ZOFRAN) IV, mouth rinse, oxyCODONE, polyethylene glycol, polyvinyl alcohol   I have personally reviewed following labs and imaging studies  LABORATORY DATA: CBC: Recent Labs  Lab 10/30/23 0512 11/02/23 0727 11/04/23 0316  WBC 4.6 4.2 4.7  NEUTROABS 2.2  --   --   HGB 10.1* 10.3* 10.1*  HCT 31.2* 31.4* 31.2*  MCV 89.9 91.5 90.7  PLT 333 274 294    Basic Metabolic Panel: Recent Labs  Lab 10/30/23 0512 11/02/23 0727 11/04/23 0316  NA 132* 135 138  K 3.9 4.3 4.4  CL  96* 100 98  CO2 25 25 30   GLUCOSE 141* 119* 85  BUN 14 18 17   CREATININE 1.30* 1.23 1.34*  CALCIUM 9.0 9.2 9.3    GFR: Estimated Creatinine Clearance (by C-G formula based on SCr of 1.34 mg/dL (H)) Male: 16.1 mL/min (A) Male: 88.2 mL/min (A)  Liver Function Tests: No results for input(s): "AST", "ALT", "ALKPHOS", "BILITOT", "PROT", "ALBUMIN" in the last 168 hours. No results for input(s): "LIPASE", "AMYLASE" in the last 168 hours. No results for input(s): "AMMONIA" in the last 168  hours.  Coagulation Profile: No results for input(s): "INR", "PROTIME" in the last 168 hours.   Cardiac Enzymes: No results for input(s): "CKTOTAL", "CKMB", "CKMBINDEX", "TROPONINI" in the last 168 hours.  BNP (last 3 results) No results for input(s): "PROBNP" in the last 8760 hours.  Lipid Profile: No results for input(s): "CHOL", "HDL", "LDLCALC", "TRIG", "CHOLHDL", "LDLDIRECT" in the last 72 hours.  Thyroid Function Tests: No results for input(s): "TSH", "T4TOTAL", "FREET4", "T3FREE", "THYROIDAB" in the last 72 hours.  Anemia Panel: No results for input(s): "VITAMINB12", "FOLATE", "FERRITIN", "TIBC", "IRON", "RETICCTPCT" in the last 72 hours.  Urine analysis:    Component Value Date/Time   COLORURINE YELLOW 09/25/2023 0052   APPEARANCEUR CLEAR 09/25/2023 0052   LABSPEC 1.028 09/25/2023 0052   PHURINE 5.0 09/25/2023 0052   GLUCOSEU >=500 (A) 09/25/2023 0052   HGBUR SMALL (A) 09/25/2023 0052   BILIRUBINUR NEGATIVE 09/25/2023 0052   KETONESUR NEGATIVE 09/25/2023 0052   PROTEINUR 30 (A) 09/25/2023 0052   UROBILINOGEN 0.2 10/08/2011 1145   NITRITE NEGATIVE 09/25/2023 0052   LEUKOCYTESUR NEGATIVE 09/25/2023 0052    Sepsis Labs: Lactic Acid, Venous    Component Value Date/Time   LATICACIDVEN 1.9 09/29/2023 1747    MICROBIOLOGY: No results found for this or any previous visit (from the past 240 hours).  RADIOLOGY STUDIES/RESULTS: No results found.   LOS: 40 days   Kimberly Penna, MD  [facility]    To contact the attending provider between 7A-7P or the covering provider during after hours 7P-7A, please log into the web site [email or web address] and access using universal [place] password for that web site. If you do not have the password, please call the hospital operator.  11/05/2023, 8:54 AM

## 2023-11-05 NOTE — Progress Notes (Signed)
 Trach removed earlier today, patient currently on room air tolerating well. Stoma is covered with gauze, RT will continue to monitor patient.

## 2023-11-06 DIAGNOSIS — E1165 Type 2 diabetes mellitus with hyperglycemia: Secondary | ICD-10-CM | POA: Diagnosis not present

## 2023-11-06 DIAGNOSIS — J690 Pneumonitis due to inhalation of food and vomit: Secondary | ICD-10-CM | POA: Diagnosis not present

## 2023-11-06 DIAGNOSIS — J9601 Acute respiratory failure with hypoxia: Secondary | ICD-10-CM | POA: Diagnosis not present

## 2023-11-06 DIAGNOSIS — I10 Essential (primary) hypertension: Secondary | ICD-10-CM | POA: Diagnosis not present

## 2023-11-06 LAB — GLUCOSE, CAPILLARY
Glucose-Capillary: 100 mg/dL — ABNORMAL HIGH (ref 70–99)
Glucose-Capillary: 195 mg/dL — ABNORMAL HIGH (ref 70–99)
Glucose-Capillary: 118 mg/dL — ABNORMAL HIGH (ref 70–99)
Glucose-Capillary: 142 mg/dL — ABNORMAL HIGH (ref 70–99)
Glucose-Capillary: 144 mg/dL — ABNORMAL HIGH (ref 70–99)
Glucose-Capillary: 143 mg/dL — ABNORMAL HIGH (ref 70–99)

## 2023-11-06 NOTE — Plan of Care (Signed)
 Problem: Health Behavior/Discharge Planning: Goal: Ability to identify and utilize available resources and services will improve 11/06/2023 0421 by Donley Furth, RN Outcome: Progressing 11/06/2023 0418 by Donley Furth, RN Outcome: Progressing Goal: Ability to manage health-related needs will improve 11/06/2023 0421 by Donley Furth, RN Outcome: Progressing 11/06/2023 0418 by Donley Furth, RN Outcome: Progressing   Problem: Metabolic: Goal: Ability to maintain appropriate glucose levels will improve 11/06/2023 0421 by Donley Furth, RN Outcome: Progressing 11/06/2023 0418 by Donley Furth, RN Outcome: Progressing   Problem: Nutritional: Goal: Maintenance of adequate nutrition will improve 11/06/2023 0421 by Donley Furth, RN Outcome: Progressing 11/06/2023 0418 by Donley Furth, RN Outcome: Progressing Goal: Progress toward achieving an optimal weight will improve 11/06/2023 0421 by Donley Furth, RN Outcome: Progressing 11/06/2023 0418 by Donley Furth, RN Outcome: Progressing   Problem: Skin Integrity: Goal: Risk for impaired skin integrity will decrease 11/06/2023 0421 by Donley Furth, RN Outcome: Progressing 11/06/2023 0418 by Donley Furth, RN Outcome: Progressing   Problem: Tissue Perfusion: Goal: Adequacy of tissue perfusion will improve 11/06/2023 0421 by Donley Furth, RN Outcome: Progressing 11/06/2023 0418 by Donley Furth, RN Outcome: Progressing   Problem: Education: Goal: Knowledge of General Education information will improve Description: Including pain rating scale, medication(s)/side effects and non-pharmacologic comfort measures 11/06/2023 0421 by Donley Furth, RN Outcome: Progressing 11/06/2023 0418 by Donley Furth, RN Outcome: Progressing   Problem: Health Behavior/Discharge Planning: Goal: Ability to manage health-related needs will improve 11/06/2023 0421 by Donley Furth, RN Outcome:  Progressing 11/06/2023 0418 by Donley Furth, RN Outcome: Progressing   Problem: Clinical Measurements: Goal: Ability to maintain clinical measurements within normal limits will improve 11/06/2023 0421 by Donley Furth, RN Outcome: Progressing 11/06/2023 0418 by Donley Furth, RN Outcome: Progressing Goal: Will remain free from infection 11/06/2023 0421 by Donley Furth, RN Outcome: Progressing 11/06/2023 0418 by Donley Furth, RN Outcome: Progressing Goal: Diagnostic test results will improve 11/06/2023 0421 by Donley Furth, RN Outcome: Progressing 11/06/2023 0418 by Donley Furth, RN Outcome: Progressing Goal: Respiratory complications will improve 11/06/2023 0421 by Donley Furth, RN Outcome: Progressing 11/06/2023 0418 by Donley Furth, RN Outcome: Progressing Goal: Cardiovascular complication will be avoided 11/06/2023 0421 by Donley Furth, RN Outcome: Progressing 11/06/2023 0418 by Donley Furth, RN Outcome: Progressing   Problem: Activity: Goal: Risk for activity intolerance will decrease 11/06/2023 0421 by Donley Furth, RN Outcome: Progressing 11/06/2023 0418 by Donley Furth, RN Outcome: Progressing   Problem: Nutrition: Goal: Adequate nutrition will be maintained 11/06/2023 0421 by Donley Furth, RN Outcome: Progressing 11/06/2023 0418 by Donley Furth, RN Outcome: Progressing   Problem: Coping: Goal: Level of anxiety will decrease 11/06/2023 0421 by Donley Furth, RN Outcome: Progressing 11/06/2023 0418 by Donley Furth, RN Outcome: Progressing   Problem: Elimination: Goal: Will not experience complications related to bowel motility 11/06/2023 0421 by Donley Furth, RN Outcome: Progressing 11/06/2023 0418 by Donley Furth, RN Outcome: Progressing Goal: Will not experience complications related to urinary retention 11/06/2023 0421 by Donley Furth, RN Outcome: Progressing 11/06/2023 0418 by Donley Furth, RN Outcome: Progressing   Problem: Pain Managment: Goal: General experience of comfort will improve and/or be controlled 11/06/2023 0421 by Donley Furth, RN Outcome: Progressing 11/06/2023 0418 by Donley Furth, RN Outcome: Progressing   Problem: Safety: Goal: Ability to remain free from injury will  improve 11/06/2023 0421 by Donley Furth, RN Outcome: Progressing 11/06/2023 0418 by Donley Furth, RN Outcome: Progressing   Problem: Skin Integrity: Goal: Risk for impaired skin integrity will decrease 11/06/2023 0421 by Donley Furth, RN Outcome: Progressing 11/06/2023 0418 by Donley Furth, RN Outcome: Progressing   Problem: Activity: Goal: Ability to tolerate increased activity will improve 11/06/2023 0421 by Donley Furth, RN Outcome: Progressing 11/06/2023 0418 by Donley Furth, RN Outcome: Progressing   Problem: Clinical Measurements: Goal: Ability to maintain a body temperature in the normal range will improve 11/06/2023 0421 by Donley Furth, RN Outcome: Progressing 11/06/2023 0418 by Donley Furth, RN Outcome: Progressing   Problem: Respiratory: Goal: Ability to maintain adequate ventilation will improve 11/06/2023 0421 by Donley Furth, RN Outcome: Progressing 11/06/2023 0418 by Donley Furth, RN Outcome: Progressing Goal: Ability to maintain a clear airway will improve 11/06/2023 0421 by Donley Furth, RN Outcome: Progressing 11/06/2023 0418 by Donley Furth, RN Outcome: Progressing   Problem: Education: Goal: Knowledge of disease or condition will improve 11/06/2023 0421 by Donley Furth, RN Outcome: Progressing 11/06/2023 0418 by Donley Furth, RN Outcome: Progressing Goal: Knowledge of secondary prevention will improve (MUST DOCUMENT ALL) 11/06/2023 0421 by Donley Furth, RN Outcome: Progressing 11/06/2023 0418 by Donley Furth, RN Outcome: Progressing Goal: Knowledge of patient specific risk factors will  improve (DELETE if not current risk factor) 11/06/2023 0421 by Donley Furth, RN Outcome: Progressing 11/06/2023 0418 by Donley Furth, RN Outcome: Progressing   Problem: Ischemic Stroke/TIA Tissue Perfusion: Goal: Complications of ischemic stroke/TIA will be minimized 11/06/2023 0421 by Donley Furth, RN Outcome: Progressing 11/06/2023 0418 by Donley Furth, RN Outcome: Progressing   Problem: Coping: Goal: Will verbalize positive feelings about self 11/06/2023 0421 by Donley Furth, RN Outcome: Progressing 11/06/2023 0418 by Donley Furth, RN Outcome: Progressing Goal: Will identify appropriate support needs 11/06/2023 0421 by Donley Furth, RN Outcome: Progressing 11/06/2023 0418 by Donley Furth, RN Outcome: Progressing   Problem: Health Behavior/Discharge Planning: Goal: Ability to manage health-related needs will improve 11/06/2023 0421 by Donley Furth, RN Outcome: Progressing 11/06/2023 0418 by Donley Furth, RN Outcome: Progressing Goal: Goals will be collaboratively established with patient/family 11/06/2023 0421 by Donley Furth, RN Outcome: Progressing 11/06/2023 0418 by Donley Furth, RN Outcome: Progressing   Problem: Self-Care: Goal: Ability to participate in self-care as condition permits will improve 11/06/2023 0421 by Donley Furth, RN Outcome: Progressing 11/06/2023 0418 by Donley Furth, RN Outcome: Progressing Goal: Verbalization of feelings and concerns over difficulty with self-care will improve 11/06/2023 0421 by Donley Furth, RN Outcome: Progressing 11/06/2023 0418 by Donley Furth, RN Outcome: Progressing Goal: Ability to communicate needs accurately will improve 11/06/2023 0421 by Donley Furth, RN Outcome: Progressing 11/06/2023 0418 by Donley Furth, RN Outcome: Progressing   Problem: Nutrition: Goal: Risk of aspiration will decrease 11/06/2023 0421 by Donley Furth, RN Outcome:  Progressing 11/06/2023 0418 by Donley Furth, RN Outcome: Progressing Goal: Dietary intake will improve 11/06/2023 0421 by Donley Furth, RN Outcome: Progressing 11/06/2023 0418 by Donley Furth, RN Outcome: Progressing   Problem: Activity: Goal: Ability to tolerate increased activity will improve 11/06/2023 0421 by Donley Furth, RN Outcome: Progressing 11/06/2023 0418 by Donley Furth, RN Outcome: Progressing   Problem: Respiratory: Goal: Ability to maintain a clear airway and adequate ventilation will improve 11/06/2023 0421 by Mikel Alderman,  Willard Harman, RN Outcome: Progressing 11/06/2023 0418 by Donley Furth, RN Outcome: Progressing   Problem: Role Relationship: Goal: Method of communication will improve 11/06/2023 0421 by Donley Furth, RN Outcome: Progressing 11/06/2023 0418 by Donley Furth, RN Outcome: Progressing   Problem: Nutrition: Goal: Dietary intake will improve 11/06/2023 0421 by Donley Furth, RN Outcome: Progressing 11/06/2023 0418 by Donley Furth, RN Outcome: Progressing

## 2023-11-06 NOTE — Progress Notes (Signed)
 Physical Therapy Treatment Patient Details Name: Jeremiah Stevens MRN: [id number] DOB: [birth year] Today's Date: 11/06/2023   History of Present Illness 48 yo pt admitted to [facility] 09/26/23 for difficulty swallowing. 3/19 PEA, ROSC after 2 min, then bradycardic arrest 1 min later, CPR resumed, pt intubated, and ROSC 6 min later. Seizure-like activity also noted briefly after ROSC. 3/20 pt transferred to [facility]. 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 3/21 self extubated and reintubated. 3/31 trach. Tracheostomy changed to #6 cuffless 4/14. S/p PEG 4/18. PMHx: HIV/AIDS, HTN, herpes, migraine, depression, DM II, polysubstance use, and seizure.   PT Comments  Pt greeted supine in bed, pleasant and agreeable to PT session. Pt ambulated ~487ft without an AD and supervision for safety. He demonstrated mild unsteadiness, which increased as he fatigued. Pt ascended/descended 6 steps twice using RW with CGA. He advanced to a reciprocal gait pattern alternating steps. He is highly motivated to regain his independence and return home. Will continue to follow acutely and advance appropriately.     If plan is discharge home, recommend the following: Assistance with cooking/housework;Assist for transportation;Help with stairs or ramp for entrance   Can travel by private vehicle     Yes  Equipment Recommendations  Rolling walker (2 wheels)    Recommendations for Other Services       Precautions / Restrictions Precautions Precautions: Fall;Other (comment) Recall of Precautions/Restrictions: Intact Precaution/Restrictions Comments: PEG Required Braces or Orthoses: Other Brace Other Brace: Abdominal binder Restrictions Weight Bearing Restrictions Per Provider Order: No     Mobility  Bed Mobility Overal bed mobility: Modified Independent Bed Mobility: Supine to Sit           General bed mobility comments: Pt sat up on L side of bed with HOB slightly elevated and  no use of bed rails.    Transfers Overall transfer level: Modified independent Equipment used: None Transfers: Sit to/from Stand             General transfer comment: Pt stood from lowest bed height by pushing up with BUE support from bed. Good eccentric control with sitting.    Ambulation/Gait Ambulation/Gait assistance: Supervision Gait Distance (Feet): 400 Feet Assistive device: None Gait Pattern/deviations: Step-through pattern, Decreased stride length, Drifts right/left Gait velocity: decreased Gait velocity interpretation: <1.8 ft/sec, indicate of risk for recurrent falls   General Gait Details: Pt ambulated without an AD. He demonstrated a steady pace, even weight shift, adequate foot clearence, and limited arm swing. He was slightly unsteady in the hallway as he was looking around and fatigued, but no LOB.   Stairs Stairs: Yes Stairs assistance: Contact guard assist Stair Management: One rail Right, Forwards, Step to pattern, Alternating pattern Number of Stairs: 6 (x2) General stair comments: On the first set pt used a step by step approach leading with RLE. He turned around on the 6th step with small tight steps and no LOB. On the second set pt used a reciprocal gait pattern.   Wheelchair Mobility     Tilt Bed    Modified Rankin (Stroke Patients Only) Modified Rankin (Stroke Patients Only) Pre-Morbid Rankin Score: No symptoms Modified Rankin: Moderately severe disability     Balance Overall balance assessment: Needs assistance Sitting-balance support: Feet supported Sitting balance-Leahy Scale: Good Sitting balance - Comments: Pt seated EOB able to reach in/out BOS without LOB   Standing balance support: No upper extremity supported, During functional activity Standing balance-Leahy Scale: Fair Standing balance comment: Pt ambulated without AD.  He drifted R/L and demonstrated mild unsteadiness, but no LOB.                             Communication    Cognition Arousal: Alert Behavior During Therapy: WFL for tasks assessed/performed   PT - Cognitive impairments: No apparent impairments                         Following commands: Intact      Cueing Cueing Techniques: Verbal cues  Exercises      General Comments General comments (skin integrity, edema, etc.): VSS on RA      Pertinent Vitals/Pain Pain Assessment Pain Assessment: No/denies pain    Home Living                          Prior Function            PT Goals (current goals can now be found in the care plan section) Acute Rehab PT Goals Patient Stated Goal: Return Home Progress towards PT goals: Progressing toward goals    Frequency    Min 2X/week      PT Plan      Co-evaluation              AM-PAC PT "6 Clicks" Mobility   Outcome Measure  Help needed turning from your back to your side while in a flat bed without using bedrails?: None Help needed moving from lying on your back to sitting on the side of a flat bed without using bedrails?: None Help needed moving to and from a bed to a chair (including a wheelchair)?: None Help needed standing up from a chair using your arms (e.g., wheelchair or bedside chair)?: None Help needed to walk in hospital room?: A Little Help needed climbing 3-5 steps with a railing? : A Little 6 Click Score: 22    End of Session Equipment Utilized During Treatment: Gait belt Activity Tolerance: Patient tolerated treatment well Patient left: in bed;with call bell/phone within reach Nurse Communication: Mobility status PT Visit Diagnosis: Unsteadiness on feet (R26.81);Muscle weakness (generalized) (M62.81);Other abnormalities of gait and mobility (R26.89);Difficulty in walking, not elsewhere classified (R26.2);Other symptoms and signs involving the nervous system (R29.898) Hemiplegia - Right/Left: Right Hemiplegia - dominant/non-dominant: Non-dominant Hemiplegia - caused by:  Cerebral infarction     Time: 1610-9604 PT Time Calculation (min) (ACUTE ONLY): 20 min  Charges:    $Gait Training: 8-22 mins PT General Charges $$ ACUTE PT VISIT: 1 Visit                     Glenford Lanes, PT, DPT Acute Rehabilitation Services Office: [phone] Secure Chat Preferred  Riva Chester 11/06/2023, 2:08 PM

## 2023-11-06 NOTE — Progress Notes (Signed)
 PCCM Progress Note   Patient continues to do well post trach decannulation. PCCM will sign off. Thank you for the opportunity to participate in this patient's care. Please contact if we can be of further assistance.   Cheston Coury D. Harris, NP-C [facility] Personal contact information can be found on Amion  If no contact or response made please call [phone] 11/06/2023, 4:22 PM

## 2023-11-06 NOTE — Progress Notes (Signed)
 PROGRESS NOTE        PATIENT DETAILS Name: Jeremiah Stevens Age: 48 y.o. Sex: adult Date of Birth: 10-May-1976 Admit Date: 09/26/2023 Admitting Physician Arlina Lair, MD [id number], Adah Acron, MD  Brief Summary: 49 y.o. adult who with medical of DM2, seizures, HIV AIDS untreated, cryptococcal disease, obesity, polysubstance abuse, prior CVA who was admitted on 3/16 with dysphagia/drooling- found to be hypoxic-further evaluation revealed multifocal PNA.  Patient was started on antibiotics and admitted to the hospitalist service-Hospital course complicated by acute CVA-despite ongoing dysphagia-patient was noncompliant with diet (threatening to leave AMA while stroke workup ongoing)-reportedly ordered a pizza and had an aspiration event-with worsening hypoxemia-requiring transfer to the ICU where he had a PEA arrest-requiring 9 minutes of ACLS prior to ROSC.  During intubation-copious amounts of food material/emesis was observed in the oropharynx.  Patient had seizure-like activity after intubation.  He was subsequently managed in the ICU-was difficult to wean off the ventilator-he self extubated but had to be reintubated-ultimately ENT performed tracheostomy on 3/31.  He was transferred back to [facility] service on 4/7.  Significant events: 3/16 admit 3/18 neuro consult, found to have acute CVA 3/19 Ongoing aspiration despite obvious dysphagia, was planning to leave AMA but sister talked pt into staying. then had a massive aspiration event leading to worse hypoxia and PEA arrest. Txf to [facility] 3/20 PSV cumulative total of 5-6 hrs, following some commands/ purposefully 3/21 self extubated, was reintubated 3/23 Failed SBT due to WOB 3/24 Failed SBT due to apnea 3/25 Tolerated PS >4 hours 3/26: Intubated. 3/31: S/p tracheostomy with ENT. 4/4: trach collar starting 1200  4/5 cont on trach collar  4/7: Care transferred from [facility] to [facility] 4/8: Modified barium swallow.  Failed.   NPO. 4/19: PEG tube insertion by IR 4/24: Trach decannulated by PCCM  Significant studies: 3/15>> CT head: No acute intracranial abnormality. 3/16>> CTA chest: No PE 3/19>> MRI brain: Acute right medullary infarct. 3/19>> CT angio head/neck: No LVO 3/19>> LDL: 95 3/19>> A1c: 10.8 3/19>> echo: EF 60-65%, grade 1 diastolic dysfunction. 3/20-3/21>> LTM EEG: No seizures. 4/12>> CT abdomen: Gastric anatomy is borderline for percutaneous gastrostomy tube placement. 4/17>> CTA chest: No PE. 4/17>> B/L lower extremity Doppler: No DVT.  Significant microbiology data: 3/16>> COVID/influenza/RSV PCR: Negative 3/16>> blood culture: No growth 3/17>> sputum culture: Normal respiratory flora 3/19>> tracheal aspirate: No organisms.   3/20>> sputum culture: Normal respiratory flora 3/27>> blood culture: No growth 4/01>> tracheal aspirate: Normal respiratory flora 4/02>> blood culture: No growth  Procedures: 4/18>> PEG tube by IR.  Consults: Neurology PCCM  Subjective: Eating breakfast at bedside chair.  No complaints.  No shortness of breath-or chest pain.  On room air.  Objective: Vitals: Blood pressure (!) 122/99, pulse 91, temperature 98.2 F (36.8 C), temperature source Oral, resp. rate 20, height 6' (1.829 m), weight 112.4 kg, SpO2 95%.   Exam: Gen Exam:Alert awake-not in any distress HEENT:atraumatic, normocephalic Chest: B/L clear to auscultation anteriorly CVS:S1S2 regular Abdomen:soft non tender, non distended Extremities:no edema Neurology: Non focal Skin: no rash  Pertinent Labs/Radiology:    Latest Ref Rng & Units 11/04/2023    3:16 AM 11/02/2023    7:27 AM 10/30/2023    5:12 AM  CBC  WBC 4.0 - 10.5 K/uL 4.7  4.2  4.6   Hemoglobin 13.0 - 17.0 g/dL 40.9  81.1  91.4  Hematocrit 39.0 - 52.0 % 31.2  31.4  31.2   Platelets 150 - 400 K/uL 294  274  333     Lab Results  Component Value Date   NA 138 11/04/2023   K 4.4 11/04/2023   CL 98 11/04/2023   CO2 30  11/04/2023      Assessment/Plan: Acute hypoxic respiratory failure secondary to multifocal pneumonia and subsequently an aspiration event Much better-weaned off the vent-required trach on 3/31-but subsequently decannulated on 4/24 Currently on room air Has completed a course of antibiotics.    PEA arrest Secondary to aspiration event/possible seizures Telemetry monitoring Echo stable No significant anoxic issues-follows commands-moving all 4 extremities-speech clear and fluent.  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 Continue statin  Dysphagia Likely oral pharyngeal dysphagia in the setting of CVA. Failed MBS-PEG inserted by IR on 4/5 Overall slowly improving-SLP following-full liquid diet which-patient is tolerating. Has now been switched over to only nocturnal feeding.   Awaiting further recommendations from SLP regarding further advancement of diet  AKI Likely hemodynamically mediated Resolved.  Chronic HFpEF Euvolemic  Chest pain Elevated D-dimer No further chest pain CTA chest/Doppler study negative for VTE. On SQ Lovenox  prophylactic dose.  HTN BP stable Continue metoprolol   HIV/AIDS Continue antiretrovirals On Mepron  for PJP prophylaxis Previously noncompliant to medications.  History of cryptococcal antigenemia  On fluconazole .  Normocytic anemia Due to critical illness/chronic disease Hb stable-follow periodically.  DM-2 (A1c 10.8 on 3/19) CBGs stable Semglee  15 units twice daily+ 8 units of NovoLog  4 times daily+SSI  Recent Labs    11/05/23 2253 11/06/23 0325 11/06/23 0825  GLUCAP 173* 100* 195*    GERD Pepcid   Anxiety/depression Stable Continue Paxil  and Seroquel .  Medical noncompliance Previously noncompliant to ART's Has been  noncompliant with diet-had a significant aspiration event. Per prior notes-has pulled out feeding tube-has attempted to pull a tracheotomy-prior MD's has explained the risk of sudden aspiration/respiratory arrest/death.  Nutrition Status: Nutrition Problem: Increased nutrient needs Etiology: chronic illness Signs/Symptoms: estimated needs Interventions: Tube feeding, Prostat  Class 1 Obesity: Estimated body mass index is 33.61 kg/m as calculated from the following:   Height as of this encounter: 6' (1.829 m).   Weight as of this encounter: 112.4 kg.   Code status:   Code Status: Full Code   DVT Prophylaxis: SQ Lovenox    Family Communication: Sister-Korie [phone] updated over the phone 4/26   Disposition Plan: Status is: Inpatient Remains inpatient appropriate because: Severity of illness   Planned Discharge Destination:Skilled nursing facility   Diet: Diet Order             Diet full liquid Room service appropriate? Yes with Assist; Fluid consistency: Thin  Diet effective now                     Antimicrobial agents: Anti-infectives (From admission, onward)    Start     Dose/Rate Route Frequency Ordered Stop   10/29/23 1200  fluconazole  (DIFLUCAN ) 40 MG/ML suspension 400 mg  Status:  Discontinued        400 mg Per Tube  Once 10/29/23 1106 10/29/23 1217   10/29/23 0830  vancomycin  (VANCOCIN ) IVPB 1000 mg/200 mL premix        over 60 Minutes Intravenous Continuous PRN 10/29/23 0838 10/29/23 0830   10/29/23 0000  vancomycin  (VANCOREADY) IVPB  1500 mg/300 mL        1,500 mg 150 mL/hr over 120 Minutes Intravenous To Radiology 10/26/23 1559 10/29/23 0325   10/15/23 1000  emtricitabine -tenofovir  (TRUVADA ) 200-300 MG per tablet 1 tablet        1 tablet Per Tube Daily 10/15/23 0836     10/15/23 0900  ceFEPIme  (MAXIPIME ) 2 g in sodium chloride  0.9 % 100 mL IVPB        2 g 200 mL/hr over 30 Minutes Intravenous Every 8 hours 10/15/23 0846 10/22/23 0959    10/14/23 1000  emtricitabine -tenofovir  (TRUVADA ) 200-300 MG per tablet 1 tablet  Status:  Discontinued        1 tablet Oral Daily 10/13/23 1340 10/15/23 0836   10/13/23 0100  ceFEPIme  (MAXIPIME ) 2 g in sodium chloride  0.9 % 100 mL IVPB  Status:  Discontinued        2 g 200 mL/hr over 30 Minutes Intravenous Every 8 hours 10/12/23 2352 10/15/23 0846   10/13/23 0100  metroNIDAZOLE  (FLAGYL ) IVPB 500 mg  Status:  Discontinued        500 mg 100 mL/hr over 60 Minutes Intravenous Every 12 hours 10/12/23 2352 10/15/23 0846   10/09/23 1200  atovaquone  (MEPRON ) 750 MG/5ML suspension 1,500 mg        1,500 mg Per Tube Daily with lunch 10/09/23 1025     10/09/23 1115  fluconazole  (DIFLUCAN ) tablet 400 mg        400 mg Per Tube Daily 10/09/23 1025     10/03/23 1100  clindamycin  (CLEOCIN ) IVPB 900 mg  Status:  Discontinued        900 mg 100 mL/hr over 30 Minutes Intravenous Every 8 hours 10/03/23 1014 10/09/23 1025   10/03/23 1100  primaquine  tablet 30 mg  Status:  Discontinued        30 mg Per NG tube Daily 10/03/23 1014 10/09/23 1025   10/02/23 2200  metroNIDAZOLE  (FLAGYL ) tablet 500 mg  Status:  Discontinued        500 mg Per Tube Every 12 hours 10/02/23 1140 10/03/23 1014   09/30/23 1030  dolutegravir  (TIVICAY ) tablet 50 mg        50 mg Per Tube Daily 09/30/23 0931     09/30/23 1030  emtricitabine -tenofovir  AF (DESCOVY ) 200-25 MG per tablet 1 tablet  Status:  Discontinued        1 tablet Per Tube Daily 09/30/23 0931 10/13/23 1340   09/30/23 1000  sulfamethoxazole -trimethoprim  (BACTRIM  DS) 800-160 MG per tablet 1 tablet  Status:  Discontinued        1 tablet Per Tube Daily 09/29/23 1637 10/03/23 1014   09/29/23 1800  metroNIDAZOLE  (FLAGYL ) IVPB 500 mg  Status:  Discontinued        500 mg 100 mL/hr over 60 Minutes Intravenous Every 12 hours 09/29/23 1716 10/02/23 1140   09/29/23 1400  cefTRIAXone  (ROCEPHIN ) 2 g in sodium chloride  0.9 % 100 mL IVPB        2 g 200 mL/hr over 30 Minutes Intravenous  Every 24 hours 09/29/23 1317 10/02/23 1420   09/28/23 1445  fluconazole  (DIFLUCAN ) IVPB 400 mg  Status:  Discontinued        400 mg 100 mL/hr over 120 Minutes Intravenous Daily 09/28/23 1359 10/09/23 1025   09/27/23 1600  cefTRIAXone  (ROCEPHIN ) 2 g in sodium chloride  0.9 % 100 mL IVPB  Status:  Discontinued        2 g 200 mL/hr over 30 Minutes Intravenous Daily 09/27/23  1004 09/29/23 1317   09/27/23 1000  azithromycin  (ZITHROMAX ) tablet 500 mg  Status:  Discontinued        500 mg Oral Daily 09/26/23 2307 09/27/23 1004   09/27/23 1000  bictegravir-emtricitabine -tenofovir  AF (BIKTARVY ) 50-200-25 MG per tablet 1 tablet  Status:  Discontinued        1 tablet Oral Daily 09/27/23 0004 09/30/23 0931   09/27/23 1000  sulfamethoxazole -trimethoprim  (BACTRIM  DS) 800-160 MG per tablet 1 tablet  Status:  Discontinued        1 tablet Oral Daily 09/27/23 0017 09/29/23 1637   09/27/23 0800  ceFEPIme  (MAXIPIME ) 2 g in sodium chloride  0.9 % 100 mL IVPB  Status:  Discontinued        2 g 200 mL/hr over 30 Minutes Intravenous Every 8 hours 09/26/23 2333 09/27/23 1004   09/27/23 0000  fluconazole  (DIFLUCAN ) tablet 400 mg  Status:  Discontinued        400 mg Oral Daily 09/26/23 2352 09/28/23 1359   09/26/23 2300  ceFEPIme  (MAXIPIME ) 2 g in sodium chloride  0.9 % 100 mL IVPB        2 g 200 mL/hr over 30 Minutes Intravenous  Once 09/26/23 2245 09/26/23 2357   09/26/23 2300  vancomycin  (VANCOREADY) IVPB 2000 mg/400 mL        2,000 mg 200 mL/hr over 120 Minutes Intravenous  Once 09/26/23 2245 09/27/23 0305        MEDICATIONS: Scheduled Meds:  aspirin   81 mg Per Tube Daily   atorvastatin   20 mg Per Tube QHS   atovaquone   1,500 mg Per Tube Q lunch   Chlorhexidine  Gluconate Cloth  6 each Topical Daily   clopidogrel   75 mg Per Tube Daily   dolutegravir   50 mg Per Tube Daily   emtricitabine -tenofovir   1 tablet Per Tube Daily   enoxaparin  (LOVENOX ) injection  50 mg Subcutaneous Q24H   famotidine   20 mg Per Tube  BID   feeding supplement (OSMOLITE 1.5 CAL)  1,000 mL Per Tube Q24H   feeding supplement (PROSource TF20)  60 mL Per Tube Daily   fiber supplement (BANATROL TF)  60 mL Per Tube BID   fluconazole   400 mg Per Tube Daily   free water   100 mL Per Tube Q4H   Gerhardt's butt cream   Topical BID   insulin  aspart  0-20 Units Subcutaneous Q4H   insulin  aspart  8 Units Subcutaneous 4 times per day   insulin  glargine-yfgn  15 Units Subcutaneous BID   metoprolol  tartrate  50 mg Per Tube BID   multivitamin with minerals  1 tablet Per Tube Daily   nicotine   14 mg Transdermal Daily   pantoprazole  (PROTONIX ) IV  40 mg Intravenous QHS   PARoxetine   20 mg Per Tube Daily   QUEtiapine   50 mg Per Tube QHS   Continuous Infusions:  dextrose  Stopped (10/23/23 0856)   PRN Meds:.acetaminophen  **OR** acetaminophen , alum & mag hydroxide-simeth, bisacodyl , dextrose , fentaNYL  (SUBLIMAZE ) injection, guaiFENesin -dextromethorphan , hydrALAZINE , hydrOXYzine , iohexol , labetalol , levalbuterol , loperamide  HCl, melatonin, ondansetron  **OR** ondansetron  (ZOFRAN ) IV, mouth rinse, oxyCODONE , polyethylene glycol, polyvinyl alcohol    I have personally reviewed following labs and imaging studies  LABORATORY DATA: CBC: Recent Labs  Lab 11/02/23 0727 11/04/23 0316  WBC 4.2 4.7  HGB 10.3* 10.1*  HCT 31.4* 31.2*  MCV 91.5 90.7  PLT 274 294    Basic Metabolic Panel: Recent Labs  Lab 11/02/23 0727 11/04/23 0316  NA 135 138  K 4.3 4.4  CL 100 98  CO2 25 30  GLUCOSE 119* 85  BUN 18 17  CREATININE 1.23 1.34*  CALCIUM  9.2 9.3    GFR: Estimated Creatinine Clearance (by C-G formula based on SCr of 1.34 mg/dL (H)) Male: 19.1 mL/min (A) Male: 88.2 mL/min (A)  Liver Function Tests: No results for input(s): "AST", "ALT", "ALKPHOS", "BILITOT", "PROT", "ALBUMIN " in the last 168 hours. No results for input(s): "LIPASE", "AMYLASE" in the last 168 hours. No results for input(s): "AMMONIA" in the last 168  hours.  Coagulation Profile: No results for input(s): "INR", "PROTIME" in the last 168 hours.   Cardiac Enzymes: No results for input(s): "CKTOTAL", "CKMB", "CKMBINDEX", "TROPONINI" in the last 168 hours.  BNP (last 3 results) No results for input(s): "PROBNP" in the last 8760 hours.  Lipid Profile: No results for input(s): "CHOL", "HDL", "LDLCALC", "TRIG", "CHOLHDL", "LDLDIRECT" in the last 72 hours.  Thyroid Function Tests: No results for input(s): "TSH", "T4TOTAL", "FREET4", "T3FREE", "THYROIDAB" in the last 72 hours.  Anemia Panel: No results for input(s): "VITAMINB12", "FOLATE", "FERRITIN", "TIBC", "IRON", "RETICCTPCT" in the last 72 hours.  Urine analysis:    Component Value Date/Time   COLORURINE YELLOW 09/25/2023 0052   APPEARANCEUR CLEAR 09/25/2023 0052   LABSPEC 1.028 09/25/2023 0052   PHURINE 5.0 09/25/2023 0052   GLUCOSEU >=500 (A) 09/25/2023 0052   HGBUR SMALL (A) 09/25/2023 0052   BILIRUBINUR NEGATIVE 09/25/2023 0052   KETONESUR NEGATIVE 09/25/2023 0052   PROTEINUR 30 (A) 09/25/2023 0052   UROBILINOGEN 0.2 10/08/2011 1145   NITRITE NEGATIVE 09/25/2023 0052   LEUKOCYTESUR NEGATIVE 09/25/2023 0052    Sepsis Labs: Lactic Acid, Venous    Component Value Date/Time   LATICACIDVEN 1.9 09/29/2023 1747    MICROBIOLOGY: No results found for this or any previous visit (from the past 240 hours).  RADIOLOGY STUDIES/RESULTS: No results found.   LOS: 41 days   Kimberly Penna, MD  [facility]    To contact the attending provider between 7A-7P or the covering provider during after hours 7P-7A, please log into the web site [email or web address] and access using universal [place] password for that web site. If you do not have the password, please call the hospital operator.  11/06/2023, 9:57 AM

## 2023-11-06 NOTE — Plan of Care (Signed)
  Problem: Health Behavior/Discharge Planning: Goal: Ability to identify and utilize available resources and services will improve Outcome: Progressing Goal: Ability to manage health-related needs will improve Outcome: Progressing   Problem: Metabolic: Goal: Ability to maintain appropriate glucose levels will improve Outcome: Progressing   Problem: Nutritional: Goal: Maintenance of adequate nutrition will improve Outcome: Progressing Goal: Progress toward achieving an optimal weight will improve Outcome: Progressing   Problem: Skin Integrity: Goal: Risk for impaired skin integrity will decrease Outcome: Progressing   Problem: Tissue Perfusion: Goal: Adequacy of tissue perfusion will improve Outcome: Progressing   Problem: Education: Goal: Knowledge of General Education information will improve Description: Including pain rating scale, medication(s)/side effects and non-pharmacologic comfort measures Outcome: Progressing   Problem: Health Behavior/Discharge Planning: Goal: Ability to manage health-related needs will improve Outcome: Progressing   Problem: Clinical Measurements: Goal: Ability to maintain clinical measurements within normal limits will improve Outcome: Progressing Goal: Will remain free from infection Outcome: Progressing Goal: Diagnostic test results will improve Outcome: Progressing Goal: Respiratory complications will improve Outcome: Progressing Goal: Cardiovascular complication will be avoided Outcome: Progressing   Problem: Activity: Goal: Risk for activity intolerance will decrease Outcome: Progressing   Problem: Nutrition: Goal: Adequate nutrition will be maintained Outcome: Progressing   Problem: Coping: Goal: Level of anxiety will decrease Outcome: Progressing   Problem: Elimination: Goal: Will not experience complications related to bowel motility Outcome: Progressing Goal: Will not experience complications related to urinary  retention Outcome: Progressing   Problem: Pain Managment: Goal: General experience of comfort will improve and/or be controlled Outcome: Progressing   Problem: Safety: Goal: Ability to remain free from injury will improve Outcome: Progressing   Problem: Skin Integrity: Goal: Risk for impaired skin integrity will decrease Outcome: Progressing   Problem: Activity: Goal: Ability to tolerate increased activity will improve Outcome: Progressing   Problem: Clinical Measurements: Goal: Ability to maintain a body temperature in the normal range will improve Outcome: Progressing   Problem: Respiratory: Goal: Ability to maintain adequate ventilation will improve Outcome: Progressing Goal: Ability to maintain a clear airway will improve Outcome: Progressing   Problem: Education: Goal: Knowledge of disease or condition will improve Outcome: Progressing Goal: Knowledge of secondary prevention will improve (MUST DOCUMENT ALL) Outcome: Progressing Goal: Knowledge of patient specific risk factors will improve (DELETE if not current risk factor) Outcome: Progressing   Problem: Ischemic Stroke/TIA Tissue Perfusion: Goal: Complications of ischemic stroke/TIA will be minimized Outcome: Progressing   Problem: Coping: Goal: Will verbalize positive feelings about self Outcome: Progressing Goal: Will identify appropriate support needs Outcome: Progressing   Problem: Health Behavior/Discharge Planning: Goal: Ability to manage health-related needs will improve Outcome: Progressing Goal: Goals will be collaboratively established with patient/family Outcome: Progressing   Problem: Self-Care: Goal: Ability to participate in self-care as condition permits will improve Outcome: Progressing Goal: Verbalization of feelings and concerns over difficulty with self-care will improve Outcome: Progressing Goal: Ability to communicate needs accurately will improve Outcome: Progressing   Problem:  Nutrition: Goal: Risk of aspiration will decrease Outcome: Progressing Goal: Dietary intake will improve Outcome: Progressing   Problem: Activity: Goal: Ability to tolerate increased activity will improve Outcome: Progressing   Problem: Respiratory: Goal: Ability to maintain a clear airway and adequate ventilation will improve Outcome: Progressing   Problem: Role Relationship: Goal: Method of communication will improve Outcome: Progressing   Problem: Nutrition: Goal: Dietary intake will improve Outcome: Progressing

## 2023-11-07 DIAGNOSIS — J690 Pneumonitis due to inhalation of food and vomit: Secondary | ICD-10-CM | POA: Diagnosis not present

## 2023-11-07 DIAGNOSIS — J9601 Acute respiratory failure with hypoxia: Secondary | ICD-10-CM | POA: Diagnosis not present

## 2023-11-07 DIAGNOSIS — I10 Essential (primary) hypertension: Secondary | ICD-10-CM | POA: Diagnosis not present

## 2023-11-07 DIAGNOSIS — E1165 Type 2 diabetes mellitus with hyperglycemia: Secondary | ICD-10-CM | POA: Diagnosis not present

## 2023-11-07 LAB — BASIC METABOLIC PANEL WITH GFR
Anion gap: 7 (ref 5–15)
BUN: 16 mg/dL (ref 6–20)
CO2: 27 mmol/L (ref 22–32)
Calcium: 9.3 mg/dL (ref 8.9–10.3)
Chloride: 102 mmol/L (ref 98–111)
Creatinine, Ser: 1.15 mg/dL (ref 0.61–1.24)
GFR, Estimated: >60 mL/min (ref >60)
Glucose, Bld: 135 mg/dL — ABNORMAL HIGH (ref 70–99)
Potassium: 4 mmol/L (ref 3.5–5.1)
Sodium: 136 mmol/L (ref 135–145)

## 2023-11-07 LAB — CBC
HCT: 31.6 % — ABNORMAL LOW (ref 39.0–52.0)
Hemoglobin: 10.1 g/dL — ABNORMAL LOW (ref 13.0–17.0)
MCH: 29 pg (ref 26.0–34.0)
MCHC: 32 g/dL (ref 30.0–36.0)
MCV: 90.8 fL (ref 80.0–100.0)
Platelets: 270 10*3/uL (ref 150–400)
RBC: 3.48 MIL/uL — ABNORMAL LOW (ref 4.22–5.81)
RDW: 15.4 % (ref 11.5–15.5)
WBC: 4 10*3/uL (ref 4.0–10.5)
nRBC: 0 % (ref 0.0–0.2)

## 2023-11-07 LAB — GLUCOSE, CAPILLARY
Glucose-Capillary: 139 mg/dL — ABNORMAL HIGH (ref 70–99)
Glucose-Capillary: 77 mg/dL (ref 70–99)
Glucose-Capillary: 120 mg/dL — ABNORMAL HIGH (ref 70–99)
Glucose-Capillary: 129 mg/dL — ABNORMAL HIGH (ref 70–99)
Glucose-Capillary: 127 mg/dL — ABNORMAL HIGH (ref 70–99)
Glucose-Capillary: 174 mg/dL — ABNORMAL HIGH (ref 70–99)

## 2023-11-07 LAB — MAGNESIUM: Magnesium: 2 mg/dL (ref 1.7–2.4)

## 2023-11-07 LAB — PHOSPHORUS: Phosphorus: 3.4 mg/dL (ref 2.5–4.6)

## 2023-11-07 NOTE — Progress Notes (Signed)
 PROGRESS NOTE        PATIENT DETAILS Name: Jeremiah Stevens Age: 48 y.o. Sex: adult Date of Birth: Sep 17, 1975 Admit Date: 09/26/2023 Admitting Physician No admitting provider for patient encounter. ZOX:WRUEAV, Adah Acron, MD  Brief Summary: 48 y.o. adult who with medical of DM2, seizures, HIV AIDS untreated, cryptococcal disease, obesity, polysubstance abuse, prior CVA who was admitted on 3/16 with dysphagia/drooling- found to be hypoxic-further evaluation revealed multifocal PNA.  Patient was started on antibiotics and admitted to the hospitalist service-Hospital course complicated by acute CVA-despite ongoing dysphagia-patient was noncompliant with diet (threatening to leave AMA while stroke workup ongoing)-reportedly ordered a pizza and had an aspiration event-with worsening hypoxemia-requiring transfer to the ICU where he had a PEA arrest-requiring 9 minutes of ACLS prior to ROSC.  During intubation-copious amounts of food material/emesis was observed in the oropharynx.  Patient had seizure-like activity after intubation.  He was subsequently managed in the ICU-was difficult to wean off the ventilator-he self extubated but had to be reintubated-ultimately ENT performed tracheostomy on 3/31.  He was transferred back to [facility] service on 4/7.  Significant events: 3/16 admit 3/18 neuro consult, found to have acute CVA 3/19 Ongoing aspiration despite obvious dysphagia, was planning to leave AMA but sister talked pt into staying. then had a massive aspiration event leading to worse hypoxia and PEA arrest. Txf to [facility] 3/20 PSV cumulative total of 5-6 hrs, following some commands/ purposefully 3/21 self extubated, was reintubated 3/23 Failed SBT due to WOB 3/24 Failed SBT due to apnea 3/25 Tolerated PS >4 hours 3/26: Intubated. 3/31: S/p tracheostomy with ENT. 4/4: trach collar starting 1200  4/5 cont on trach collar  4/7: Care transferred from [facility] to [facility] 4/8:  Modified barium swallow.  Failed.  NPO. 4/19: PEG tube insertion by IR 4/24: Trach decannulated by PCCM  Significant studies: 3/15>> CT head: No acute intracranial abnormality. 3/16>> CTA chest: No PE 3/19>> MRI brain: Acute right medullary infarct. 3/19>> CT angio head/neck: No LVO 3/19>> LDL: 95 3/19>> A1c: 10.8 3/19>> echo: EF 60-65%, grade 1 diastolic dysfunction. 3/20-3/21>> LTM EEG: No seizures. 4/12>> CT abdomen: Gastric anatomy is borderline for percutaneous gastrostomy tube placement. 4/17>> CTA chest: No PE. 4/17>> B/L lower extremity Doppler: No DVT.  Significant microbiology data: 3/16>> COVID/influenza/RSV PCR: Negative 3/16>> blood culture: No growth 3/17>> sputum culture: Normal respiratory flora 3/19>> tracheal aspirate: No organisms.   3/20>> sputum culture: Normal respiratory flora 3/27>> blood culture: No growth 4/01>> tracheal aspirate: Normal respiratory flora 4/02>> blood culture: No growth  Procedures: 4/18>> PEG tube by IR.  Consults: Neurology PCCM  Subjective: No major issues overnight-no chest pain or shortness of breath-sitting at bedside chair and eating breakfast.  Objective: Vitals: Blood pressure (!) 153/106, pulse 80, temperature 98.8 F (37.1 C), temperature source Oral, resp. rate (!) 21, height 6' (1.829 m), weight 112.4 kg, SpO2 94%.   Exam: Gen Exam:Alert awake-not in any distress HEENT:atraumatic, normocephalic Chest: B/L clear to auscultation anteriorly CVS:S1S2 regular Abdomen:soft non tender, non distended Extremities:no edema Neurology: Non focal Skin: no rash  Pertinent Labs/Radiology:    Latest Ref Rng & Units 11/07/2023    5:34 AM 11/04/2023    3:16 AM 11/02/2023    7:27 AM  CBC  WBC 4.0 - 10.5 K/uL 4.0  4.7  4.2   Hemoglobin 13.0 - 17.0 g/dL 40.9  81.1  91.4  Hematocrit 39.0 - 52.0 % 31.6  31.2  31.4   Platelets 150 - 400 K/uL 270  294  274     Lab Results  Component Value Date   NA 136 11/07/2023   K 4.0  11/07/2023   CL 102 11/07/2023   CO2 27 11/07/2023      Assessment/Plan: Acute hypoxic respiratory failure secondary to multifocal pneumonia and subsequently an aspiration event Much better-weaned off the vent-required trach on 3/31-but subsequently decannulated on 4/24 Currently on room air Has completed a course of antibiotics.    PEA arrest Secondary to aspiration event/possible seizures Telemetry monitoring Echo stable No significant anoxic issues-follows commands-moving all 4 extremities-speech clear and fluent.  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 Continue statin  Dysphagia Likely oral pharyngeal dysphagia in the setting of CVA. Failed MBS-PEG inserted by IR on 4/5 Overall slowly improving-SLP following-full liquid diet which-patient is tolerating. Has now been switched over to only nocturnal feeding.   Awaiting further recommendations from SLP regarding further advancement of diet  AKI Likely hemodynamically mediated Resolved.  Chronic HFpEF Euvolemic  Chest pain Elevated D-dimer No further chest pain CTA chest/Doppler study negative for VTE. On SQ Lovenox  prophylactic dose.  HTN BP stable Continue metoprolol   HIV/AIDS Continue antiretrovirals On Mepron  for PJP prophylaxis Previously noncompliant to medications.  History of cryptococcal antigenemia  On fluconazole .  Normocytic anemia Due to critical illness/chronic disease Hb stable-follow periodically.  DM-2 (A1c 10.8 on 3/19) CBGs stable Semglee  15 units twice daily+ 8 units of NovoLog  4 times daily+SSI  Recent Labs    11/06/23 2314 11/07/23 0317 11/07/23 0753  GLUCAP 143* 139* 77    GERD Pepcid   Anxiety/depression Stable Continue Paxil  and Seroquel .  Medical  noncompliance Previously noncompliant to ART's Has been noncompliant with diet-had a significant aspiration event. Per prior notes-has pulled out feeding tube-has attempted to pull a tracheotomy-prior MD's has explained the risk of sudden aspiration/respiratory arrest/death.  Nutrition Status: Nutrition Problem: Increased nutrient needs Etiology: chronic illness Signs/Symptoms: estimated needs Interventions: Tube feeding, Prostat  Class 1 Obesity: Estimated body mass index is 33.61 kg/m as calculated from the following:   Height as of this encounter: 6' (1.829 m).   Weight as of this encounter: 112.4 kg.   Code status:   Code Status: Full Code   DVT Prophylaxis: SQ Lovenox    Family Communication: Sister-Korie Warshawsky-[phone] updated over the phone 4/26   Disposition Plan: Status is: Inpatient Remains inpatient appropriate because: Severity of illness   Planned Discharge Destination:Skilled nursing facility   Diet: Diet Order             Diet full liquid Room service appropriate? Yes with Assist; Fluid consistency: Thin  Diet effective now                     Antimicrobial agents: Anti-infectives (From admission, onward)    Start     Dose/Rate Route Frequency Ordered Stop   10/29/23 1200  fluconazole  (DIFLUCAN ) 40 MG/ML suspension 400 mg  Status:  Discontinued        400 mg Per Tube  Once 10/29/23 1106 10/29/23 1217   10/29/23 0830  vancomycin  (VANCOCIN ) IVPB 1000 mg/200 mL premix        over 60 Minutes Intravenous Continuous PRN 10/29/23 0838 10/29/23 0830   10/29/23 0000  vancomycin  (VANCOREADY) IVPB  1500 mg/300 mL        1,500 mg 150 mL/hr over 120 Minutes Intravenous To Radiology 10/26/23 1559 10/29/23 0325   10/15/23 1000  emtricitabine -tenofovir  (TRUVADA ) 200-300 MG per tablet 1 tablet        1 tablet Per Tube Daily 10/15/23 0836     10/15/23 0900  ceFEPIme  (MAXIPIME ) 2 g in sodium chloride  0.9 % 100 mL IVPB        2 g 200 mL/hr over 30 Minutes  Intravenous Every 8 hours 10/15/23 0846 10/22/23 0959   10/14/23 1000  emtricitabine -tenofovir  (TRUVADA ) 200-300 MG per tablet 1 tablet  Status:  Discontinued        1 tablet Oral Daily 10/13/23 1340 10/15/23 0836   10/13/23 0100  ceFEPIme  (MAXIPIME ) 2 g in sodium chloride  0.9 % 100 mL IVPB  Status:  Discontinued        2 g 200 mL/hr over 30 Minutes Intravenous Every 8 hours 10/12/23 2352 10/15/23 0846   10/13/23 0100  metroNIDAZOLE  (FLAGYL ) IVPB 500 mg  Status:  Discontinued        500 mg 100 mL/hr over 60 Minutes Intravenous Every 12 hours 10/12/23 2352 10/15/23 0846   10/09/23 1200  atovaquone  (MEPRON ) 750 MG/5ML suspension 1,500 mg        1,500 mg Per Tube Daily with lunch 10/09/23 1025     10/09/23 1115  fluconazole  (DIFLUCAN ) tablet 400 mg        400 mg Per Tube Daily 10/09/23 1025     10/03/23 1100  clindamycin  (CLEOCIN ) IVPB 900 mg  Status:  Discontinued        900 mg 100 mL/hr over 30 Minutes Intravenous Every 8 hours 10/03/23 1014 10/09/23 1025   10/03/23 1100  primaquine  tablet 30 mg  Status:  Discontinued        30 mg Per NG tube Daily 10/03/23 1014 10/09/23 1025   10/02/23 2200  metroNIDAZOLE  (FLAGYL ) tablet 500 mg  Status:  Discontinued        500 mg Per Tube Every 12 hours 10/02/23 1140 10/03/23 1014   09/30/23 1030  dolutegravir  (TIVICAY ) tablet 50 mg        50 mg Per Tube Daily 09/30/23 0931     09/30/23 1030  emtricitabine -tenofovir  AF (DESCOVY ) 200-25 MG per tablet 1 tablet  Status:  Discontinued        1 tablet Per Tube Daily 09/30/23 0931 10/13/23 1340   09/30/23 1000  sulfamethoxazole -trimethoprim  (BACTRIM  DS) 800-160 MG per tablet 1 tablet  Status:  Discontinued        1 tablet Per Tube Daily 09/29/23 1637 10/03/23 1014   09/29/23 1800  metroNIDAZOLE  (FLAGYL ) IVPB 500 mg  Status:  Discontinued        500 mg 100 mL/hr over 60 Minutes Intravenous Every 12 hours 09/29/23 1716 10/02/23 1140   09/29/23 1400  cefTRIAXone  (ROCEPHIN ) 2 g in sodium chloride  0.9 % 100 mL  IVPB        2 g 200 mL/hr over 30 Minutes Intravenous Every 24 hours 09/29/23 1317 10/02/23 1420   09/28/23 1445  fluconazole  (DIFLUCAN ) IVPB 400 mg  Status:  Discontinued        400 mg 100 mL/hr over 120 Minutes Intravenous Daily 09/28/23 1359 10/09/23 1025   09/27/23 1600  cefTRIAXone  (ROCEPHIN ) 2 g in sodium chloride  0.9 % 100 mL IVPB  Status:  Discontinued        2 g 200 mL/hr over 30 Minutes Intravenous Daily 09/27/23  1004 09/29/23 1317   09/27/23 1000  azithromycin  (ZITHROMAX ) tablet 500 mg  Status:  Discontinued        500 mg Oral Daily 09/26/23 2307 09/27/23 1004   09/27/23 1000  bictegravir-emtricitabine -tenofovir  AF (BIKTARVY ) 50-200-25 MG per tablet 1 tablet  Status:  Discontinued        1 tablet Oral Daily 09/27/23 0004 09/30/23 0931   09/27/23 1000  sulfamethoxazole -trimethoprim  (BACTRIM  DS) 800-160 MG per tablet 1 tablet  Status:  Discontinued        1 tablet Oral Daily 09/27/23 0017 09/29/23 1637   09/27/23 0800  ceFEPIme  (MAXIPIME ) 2 g in sodium chloride  0.9 % 100 mL IVPB  Status:  Discontinued        2 g 200 mL/hr over 30 Minutes Intravenous Every 8 hours 09/26/23 2333 09/27/23 1004   09/27/23 0000  fluconazole  (DIFLUCAN ) tablet 400 mg  Status:  Discontinued        400 mg Oral Daily 09/26/23 2352 09/28/23 1359   09/26/23 2300  ceFEPIme  (MAXIPIME ) 2 g in sodium chloride  0.9 % 100 mL IVPB        2 g 200 mL/hr over 30 Minutes Intravenous  Once 09/26/23 2245 09/26/23 2357   09/26/23 2300  vancomycin  (VANCOREADY) IVPB 2000 mg/400 mL        2,000 mg 200 mL/hr over 120 Minutes Intravenous  Once 09/26/23 2245 09/27/23 0305        MEDICATIONS: Scheduled Meds:  aspirin   81 mg Per Tube Daily   atorvastatin   20 mg Per Tube QHS   atovaquone   1,500 mg Per Tube Q lunch   Chlorhexidine  Gluconate Cloth  6 each Topical Daily   clopidogrel   75 mg Per Tube Daily   dolutegravir   50 mg Per Tube Daily   emtricitabine -tenofovir   1 tablet Per Tube Daily   enoxaparin  (LOVENOX )  injection  50 mg Subcutaneous Q24H   famotidine   20 mg Per Tube BID   feeding supplement (OSMOLITE 1.5 CAL)  1,000 mL Per Tube Q24H   feeding supplement (PROSource TF20)  60 mL Per Tube Daily   fiber supplement (BANATROL TF)  60 mL Per Tube BID   fluconazole   400 mg Per Tube Daily   free water   100 mL Per Tube Q4H   Gerhardt's butt cream   Topical BID   insulin  aspart  0-20 Units Subcutaneous Q4H   insulin  aspart  8 Units Subcutaneous 4 times per day   insulin  glargine-yfgn  15 Units Subcutaneous BID   metoprolol  tartrate  50 mg Per Tube BID   multivitamin with minerals  1 tablet Per Tube Daily   nicotine   14 mg Transdermal Daily   pantoprazole  (PROTONIX ) IV  40 mg Intravenous QHS   PARoxetine   20 mg Per Tube Daily   QUEtiapine   50 mg Per Tube QHS   Continuous Infusions:  dextrose  Stopped (10/23/23 0856)   PRN Meds:.acetaminophen  **OR** acetaminophen , alum & mag hydroxide-simeth, bisacodyl , dextrose , fentaNYL  (SUBLIMAZE ) injection, guaiFENesin -dextromethorphan , hydrALAZINE , hydrOXYzine , iohexol , labetalol , levalbuterol , loperamide  HCl, melatonin, ondansetron  **OR** ondansetron  (ZOFRAN ) IV, mouth rinse, oxyCODONE , polyethylene glycol, polyvinyl alcohol    I have personally reviewed following labs and imaging studies  LABORATORY DATA: CBC: Recent Labs  Lab 11/02/23 0727 11/04/23 0316 11/07/23 0534  WBC 4.2 4.7 4.0  HGB 10.3* 10.1* 10.1*  HCT 31.4* 31.2* 31.6*  MCV 91.5 90.7 90.8  PLT 274 294 270    Basic Metabolic Panel: Recent Labs  Lab 11/02/23 0727 11/04/23 0316 11/07/23 0534  NA 135  138 136  K 4.3 4.4 4.0  CL 100 98 102  CO2 25 30 27   GLUCOSE 119* 85 135*  BUN 18 17 16   CREATININE 1.23 1.34* 1.15  CALCIUM  9.2 9.3 9.3  MG  --   --  2.0  PHOS  --   --  3.4    GFR: Estimated Creatinine Clearance (by C-G formula based on SCr of 1.15 mg/dL) Male: 16.1 mL/min Male: 102.8 mL/min  Liver Function Tests: No results for input(s): "AST", "ALT", "ALKPHOS",  "BILITOT", "PROT", "ALBUMIN " in the last 168 hours. No results for input(s): "LIPASE", "AMYLASE" in the last 168 hours. No results for input(s): "AMMONIA" in the last 168 hours.  Coagulation Profile: No results for input(s): "INR", "PROTIME" in the last 168 hours.   Cardiac Enzymes: No results for input(s): "CKTOTAL", "CKMB", "CKMBINDEX", "TROPONINI" in the last 168 hours.  BNP (last 3 results) No results for input(s): "PROBNP" in the last 8760 hours.  Lipid Profile: No results for input(s): "CHOL", "HDL", "LDLCALC", "TRIG", "CHOLHDL", "LDLDIRECT" in the last 72 hours.  Thyroid Function Tests: No results for input(s): "TSH", "T4TOTAL", "FREET4", "T3FREE", "THYROIDAB" in the last 72 hours.  Anemia Panel: No results for input(s): "VITAMINB12", "FOLATE", "FERRITIN", "TIBC", "IRON", "RETICCTPCT" in the last 72 hours.  Urine analysis:    Component Value Date/Time   COLORURINE YELLOW 09/25/2023 0052   APPEARANCEUR CLEAR 09/25/2023 0052   LABSPEC 1.028 09/25/2023 0052   PHURINE 5.0 09/25/2023 0052   GLUCOSEU >=500 (A) 09/25/2023 0052   HGBUR SMALL (A) 09/25/2023 0052   BILIRUBINUR NEGATIVE 09/25/2023 0052   KETONESUR NEGATIVE 09/25/2023 0052   PROTEINUR 30 (A) 09/25/2023 0052   UROBILINOGEN 0.2 10/08/2011 1145   NITRITE NEGATIVE 09/25/2023 0052   LEUKOCYTESUR NEGATIVE 09/25/2023 0052    Sepsis Labs: Lactic Acid, Venous    Component Value Date/Time   LATICACIDVEN 1.9 09/29/2023 1747    MICROBIOLOGY: No results found for this or any previous visit (from the past 240 hours).  RADIOLOGY STUDIES/RESULTS: No results found.   LOS: 42 days   Kimberly Penna, MD  [facility]    To contact the attending provider between 7A-7P or the covering provider during after hours 7P-7A, please log into the web site [email or web address] and access using universal [place] password for that web site. If you do not have the password, please call the hospital operator.  11/07/2023,  10:58 AM

## 2023-11-07 NOTE — Plan of Care (Signed)
  Problem: Skin Integrity: Goal: Risk for impaired skin integrity will decrease Outcome: Progressing   Problem: Activity: Goal: Risk for activity intolerance will decrease Outcome: Progressing   Problem: Nutrition: Goal: Adequate nutrition will be maintained Outcome: Progressing   Problem: Skin Integrity: Goal: Risk for impaired skin integrity will decrease Outcome: Progressing

## 2023-11-08 DIAGNOSIS — J690 Pneumonitis due to inhalation of food and vomit: Secondary | ICD-10-CM | POA: Diagnosis not present

## 2023-11-08 DIAGNOSIS — I10 Essential (primary) hypertension: Secondary | ICD-10-CM | POA: Diagnosis not present

## 2023-11-08 DIAGNOSIS — J9601 Acute respiratory failure with hypoxia: Secondary | ICD-10-CM | POA: Diagnosis not present

## 2023-11-08 DIAGNOSIS — E1165 Type 2 diabetes mellitus with hyperglycemia: Secondary | ICD-10-CM | POA: Diagnosis not present

## 2023-11-08 LAB — GLUCOSE, CAPILLARY
Glucose-Capillary: 159 mg/dL — ABNORMAL HIGH (ref 70–99)
Glucose-Capillary: 133 mg/dL — ABNORMAL HIGH (ref 70–99)
Glucose-Capillary: 104 mg/dL — ABNORMAL HIGH (ref 70–99)
Glucose-Capillary: 94 mg/dL (ref 70–99)
Glucose-Capillary: 130 mg/dL — ABNORMAL HIGH (ref 70–99)
Glucose-Capillary: 167 mg/dL — ABNORMAL HIGH (ref 70–99)

## 2023-11-08 MED ORDER — ACETAMINOPHEN 650 MG RE SUPP: 650.0000 mg | Freq: Four times a day (QID) | RECTAL | Status: DC | PRN

## 2023-11-08 MED ORDER — FAMOTIDINE 40 MG/5ML PO SUSR
20.0000 mg | Freq: Two times a day (BID) | ORAL | Status: DC
  Administered 2023-11-08 – 2023-11-10 (×4): 20 mg via ORAL
  Filled 2023-11-08 (×5): qty 2.5

## 2023-11-08 MED ORDER — HYDROXYZINE HCL 25 MG PO TABS: 25.0000 mg | ORAL_TABLET | Freq: Three times a day (TID) | ORAL | Status: DC | PRN

## 2023-11-08 MED ORDER — QUETIAPINE FUMARATE 50 MG PO TABS
50.0000 mg | ORAL_TABLET | Freq: Every day | ORAL | Status: DC
  Administered 2023-11-08 – 2023-11-09 (×2): 50 mg via ORAL
  Filled 2023-11-08 (×2): qty 1

## 2023-11-08 MED ORDER — DOLUTEGRAVIR SODIUM 50 MG PO TABS
50.0000 mg | ORAL_TABLET | Freq: Every day | ORAL | Status: DC
  Administered 2023-11-09 – 2023-11-10 (×2): 50 mg via ORAL
  Filled 2023-11-08 (×2): qty 1

## 2023-11-08 MED ORDER — ONDANSETRON HCL 4 MG PO TABS: 4.0000 mg | ORAL_TABLET | Freq: Four times a day (QID) | ORAL | Status: DC | PRN

## 2023-11-08 MED ORDER — ACETAMINOPHEN 325 MG PO TABS: 650.0000 mg | ORAL_TABLET | Freq: Four times a day (QID) | ORAL | Status: DC | PRN

## 2023-11-08 MED ORDER — ADULT MULTIVITAMIN W/MINERALS CH
1.0000 | ORAL_TABLET | Freq: Every day | ORAL | Status: DC
  Administered 2023-11-08 – 2023-11-09 (×2): 1 via ORAL
  Filled 2023-11-08 (×2): qty 1

## 2023-11-08 MED ORDER — ONDANSETRON HCL 4 MG/2ML IJ SOLN: 4.0000 mg | Freq: Four times a day (QID) | INTRAMUSCULAR | Status: DC | PRN

## 2023-11-08 MED ORDER — METOPROLOL TARTRATE 50 MG PO TABS
50.0000 mg | ORAL_TABLET | Freq: Two times a day (BID) | ORAL | Status: DC
  Administered 2023-11-08 – 2023-11-10 (×4): 50 mg via ORAL
  Filled 2023-11-08 (×4): qty 1

## 2023-11-08 MED ORDER — ASPIRIN 81 MG PO CHEW
81.0000 mg | CHEWABLE_TABLET | Freq: Every day | ORAL | Status: DC
  Administered 2023-11-09 – 2023-11-10 (×2): 81 mg via ORAL
  Filled 2023-11-08 (×2): qty 1

## 2023-11-08 MED ORDER — GUAIFENESIN-DM 100-10 MG/5ML PO SYRP: 5.0000 mL | ORAL_SOLUTION | ORAL | Status: DC | PRN

## 2023-11-08 MED ORDER — OXYCODONE HCL 5 MG/5ML PO SOLN: 5.0000 mg | ORAL | Status: DC | PRN

## 2023-11-08 MED ORDER — LOPERAMIDE HCL 1 MG/7.5ML PO SUSP
2.0000 mg | Freq: Three times a day (TID) | ORAL | Status: DC | PRN
Start: 2023-11-08 — End: 2023-11-10

## 2023-11-08 MED ORDER — FLUCONAZOLE 100 MG PO TABS
400.0000 mg | ORAL_TABLET | Freq: Every day | ORAL | Status: DC
  Administered 2023-11-09 – 2023-11-10 (×2): 400 mg via ORAL
  Filled 2023-11-08 (×2): qty 4

## 2023-11-08 MED ORDER — MELATONIN 5 MG PO TABS: 5.0000 mg | ORAL_TABLET | Freq: Every evening | ORAL | Status: DC | PRN

## 2023-11-08 MED ORDER — POLYETHYLENE GLYCOL 3350 17 G PO PACK: 17.0000 g | PACK | Freq: Every day | ORAL | Status: DC | PRN

## 2023-11-08 MED ORDER — ATOVAQUONE 750 MG/5ML PO SUSP
1500.0000 mg | Freq: Every day | ORAL | Status: DC
  Administered 2023-11-09: 1500 mg via ORAL
  Filled 2023-11-08 (×3): qty 10

## 2023-11-08 MED ORDER — PAROXETINE HCL 20 MG PO TABS
20.0000 mg | ORAL_TABLET | Freq: Every day | ORAL | Status: DC
  Administered 2023-11-09 – 2023-11-10 (×2): 20 mg via ORAL
  Filled 2023-11-08 (×2): qty 1

## 2023-11-08 MED ORDER — CLOPIDOGREL BISULFATE 75 MG PO TABS
75.0000 mg | ORAL_TABLET | Freq: Every day | ORAL | Status: DC
  Administered 2023-11-09 – 2023-11-10 (×2): 75 mg via ORAL
  Filled 2023-11-08 (×2): qty 1

## 2023-11-08 NOTE — Progress Notes (Signed)
 PROGRESS NOTE        PATIENT DETAILS Name: Jeremiah Stevens Age: 48 y.o. Sex: adult Date of Birth: October 13, 1975 Admit Date: 09/26/2023 Admitting Physician No admitting provider for patient encounter. ZOX:WRUEAV, Adah Acron, MD  Brief Summary: 48 y.o. adult who with medical of DM2, seizures, HIV AIDS untreated, cryptococcal disease, obesity, polysubstance abuse, prior CVA who was admitted on 3/16 with dysphagia/drooling- found to be hypoxic-further evaluation revealed multifocal PNA.  Patient was started on antibiotics and admitted to the hospitalist service-Hospital course complicated by acute CVA-despite ongoing dysphagia-patient was noncompliant with diet (threatening to leave AMA while stroke workup ongoing)-reportedly ordered a pizza and had an aspiration event-with worsening hypoxemia-requiring transfer to the ICU where he had a PEA arrest-requiring 9 minutes of ACLS prior to ROSC.  During intubation-copious amounts of food material/emesis was observed in the oropharynx.  Patient had seizure-like activity after intubation.  He was subsequently managed in the ICU-was difficult to wean off the ventilator-he self extubated but had to be reintubated-ultimately ENT performed tracheostomy on 3/31.  He was transferred back to [facility] service on 4/7.  Significant events: 3/16 admit 3/18 neuro consult, found to have acute CVA 3/19 Ongoing aspiration despite obvious dysphagia, was planning to leave AMA but sister talked pt into staying. then had a massive aspiration event leading to worse hypoxia and PEA arrest. Txf to [facility] 3/20 PSV cumulative total of 5-6 hrs, following some commands/ purposefully 3/21 self extubated, was reintubated 3/23 Failed SBT due to WOB 3/24 Failed SBT due to apnea 3/25 Tolerated PS >4 hours 3/26: Intubated. 3/31: S/p tracheostomy with ENT. 4/4: trach collar starting 1200  4/5 cont on trach collar  4/7: Care transferred from [facility] to [facility] 4/8:  Modified barium swallow.  Failed.  NPO. 4/19: PEG tube insertion by IR 4/24: Trach decannulated by [facility] 4/28: Diet advanced to dysphagia 2  Significant studies: 3/15>> CT head: No acute intracranial abnormality. 3/16>> CTA chest: No PE 3/19>> MRI brain: Acute right medullary infarct. 3/19>> CT angio head/neck: No LVO 3/19>> LDL: 95 3/19>> A1c: 10.8 3/19>> echo: EF 60-65%, grade 1 diastolic dysfunction. 3/20-3/21>> LTM EEG: No seizures. 4/12>> CT abdomen: Gastric anatomy is borderline for percutaneous gastrostomy tube placement. 4/17>> CTA chest: No PE. 4/17>> B/L lower extremity Doppler: No DVT.  Significant microbiology data: 3/16>> COVID/influenza/RSV PCR: Negative 3/16>> blood culture: No growth 3/17>> sputum culture: Normal respiratory flora 3/19>> tracheal aspirate: No organisms.   3/20>> sputum culture: Normal respiratory flora 3/27>> blood culture: No growth 4/01>> tracheal aspirate: Normal respiratory flora 4/02>> blood culture: No growth  Procedures: 4/18>> PEG tube by IR.  Consults: Neurology PCCM  Subjective: No complaints-no chest pain or shortness of breath.  Objective: Vitals: Blood pressure (!) 118/93, pulse 73, temperature 97.8 F (36.6 C), temperature source Oral, resp. rate 18, height 6' (1.829 m), weight 112.4 kg, SpO2 98%.   Exam: Gen Exam:Alert awake-not in any distress HEENT:atraumatic, normocephalic Chest: B/L clear to auscultation anteriorly CVS:S1S2 regular Abdomen:soft non tender, non distended Extremities:no edema Neurology: Non focal Skin: no rash  Pertinent Labs/Radiology:    Latest Ref Rng & Units 11/07/2023    5:34 AM 11/04/2023    3:16 AM 11/02/2023    7:27 AM  CBC  WBC 4.0 - 10.5 K/uL 4.0  4.7  4.2   Hemoglobin 13.0 - 17.0 g/dL 40.9  81.1  91.4   Hematocrit  39.0 - 52.0 % 31.6  31.2  31.4   Platelets 150 - 400 K/uL 270  294  274     Lab Results  Component Value Date   NA 136 11/07/2023   K 4.0 11/07/2023   CL 102  11/07/2023   CO2 27 11/07/2023      Assessment/Plan: Acute hypoxic respiratory failure secondary to multifocal pneumonia and subsequently an aspiration event Much better-weaned off the vent-required trach on 3/31-but subsequently decannulated on 4/24 Currently on room air Has completed a course of antibiotics.    PEA arrest Secondary to aspiration event/possible seizures Telemetry monitoring Echo stable No significant anoxic issues-follows commands-moving all 4 extremities-speech clear and fluent.  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 Continue statin  Dysphagia Likely oral pharyngeal dysphagia in the setting of CVA. Failed MBS-PEG inserted by IR on 4/5 SLP following-advance to dysphagia 2 diet today Tolerating nocturnal feeding through PEG If diet stabilizes-suspect can remove PEG prior to discharge.  AKI Likely hemodynamically mediated Resolved.  Chronic HFpEF Euvolemic  Chest pain Elevated D-dimer No further chest pain CTA chest/Doppler study negative for VTE. On SQ Lovenox  prophylactic dose.  HTN BP stable Continue metoprolol   HIV/AIDS Continue antiretrovirals On Mepron  for PJP prophylaxis Previously noncompliant to medications.  History of cryptococcal antigenemia  On fluconazole .  Normocytic anemia Due to critical illness/chronic disease Hb stable-follow periodically.  DM-2 (A1c 10.8 on 3/19) CBGs stable Semglee  15 units twice daily+ 8 units of NovoLog  4 times daily+SSI  Recent Labs    11/08/23 0327 11/08/23 0850 11/08/23 1125  GLUCAP 159* 133* 104*    GERD Pepcid   Anxiety/depression Stable Continue Paxil  and Seroquel .  Medical noncompliance Previously noncompliant to ART's Has been noncompliant with diet-had a significant  aspiration event. Per prior notes-has pulled out feeding tube-has attempted to pull a tracheotomy-prior MD's has explained the risk of sudden aspiration/respiratory arrest/death.  Nutrition Status: Nutrition Problem: Increased nutrient needs Etiology: chronic illness Signs/Symptoms: estimated needs Interventions: Tube feeding, Prostat  Class 1 Obesity: Estimated body mass index is 33.61 kg/m as calculated from the following:   Height as of this encounter: 6' (1.829 m).   Weight as of this encounter: 112.4 kg.   Code status:   Code Status: Full Code   DVT Prophylaxis: SQ Lovenox    Family Communication: Sister-Korie Tierney-[phone] updated over the phone 4/26   Disposition Plan: Status is: Inpatient Remains inpatient appropriate because: Severity of illness   Planned Discharge Destination:Skilled nursing facility   Diet: Diet Order             DIET DYS 2 Room service appropriate? Yes with Assist; Fluid consistency: Thin  Diet effective now                     Antimicrobial agents: Anti-infectives (From admission, onward)    Start     Dose/Rate Route Frequency Ordered Stop   10/29/23 1200  fluconazole  (DIFLUCAN ) 40 MG/ML suspension 400 mg  Status:  Discontinued        400 mg Per Tube  Once 10/29/23 1106 10/29/23 1217   10/29/23 0830  vancomycin  (VANCOCIN ) IVPB 1000 mg/200 mL premix        over 60 Minutes Intravenous Continuous PRN 10/29/23 0838 10/29/23 0830   10/29/23 0000  vancomycin  (VANCOREADY) IVPB 1500 mg/300 mL  1,500 mg 150 mL/hr over 120 Minutes Intravenous To Radiology 10/26/23 1559 10/29/23 0325   10/15/23 1000  emtricitabine -tenofovir  (TRUVADA ) 200-300 MG per tablet 1 tablet        1 tablet Per Tube Daily 10/15/23 0836     10/15/23 0900  ceFEPIme  (MAXIPIME ) 2 g in sodium chloride  0.9 % 100 mL IVPB        2 g 200 mL/hr over 30 Minutes Intravenous Every 8 hours 10/15/23 0846 10/22/23 0959   10/14/23 1000  emtricitabine -tenofovir  (TRUVADA )  200-300 MG per tablet 1 tablet  Status:  Discontinued        1 tablet Oral Daily 10/13/23 1340 10/15/23 0836   10/13/23 0100  ceFEPIme  (MAXIPIME ) 2 g in sodium chloride  0.9 % 100 mL IVPB  Status:  Discontinued        2 g 200 mL/hr over 30 Minutes Intravenous Every 8 hours 10/12/23 2352 10/15/23 0846   10/13/23 0100  metroNIDAZOLE  (FLAGYL ) IVPB 500 mg  Status:  Discontinued        500 mg 100 mL/hr over 60 Minutes Intravenous Every 12 hours 10/12/23 2352 10/15/23 0846   10/09/23 1200  atovaquone  (MEPRON ) 750 MG/5ML suspension 1,500 mg        1,500 mg Per Tube Daily with lunch 10/09/23 1025     10/09/23 1115  fluconazole  (DIFLUCAN ) tablet 400 mg        400 mg Per Tube Daily 10/09/23 1025     10/03/23 1100  clindamycin  (CLEOCIN ) IVPB 900 mg  Status:  Discontinued        900 mg 100 mL/hr over 30 Minutes Intravenous Every 8 hours 10/03/23 1014 10/09/23 1025   10/03/23 1100  primaquine  tablet 30 mg  Status:  Discontinued        30 mg Per NG tube Daily 10/03/23 1014 10/09/23 1025   10/02/23 2200  metroNIDAZOLE  (FLAGYL ) tablet 500 mg  Status:  Discontinued        500 mg Per Tube Every 12 hours 10/02/23 1140 10/03/23 1014   09/30/23 1030  dolutegravir  (TIVICAY ) tablet 50 mg        50 mg Per Tube Daily 09/30/23 0931     09/30/23 1030  emtricitabine -tenofovir  AF (DESCOVY ) 200-25 MG per tablet 1 tablet  Status:  Discontinued        1 tablet Per Tube Daily 09/30/23 0931 10/13/23 1340   09/30/23 1000  sulfamethoxazole -trimethoprim  (BACTRIM  DS) 800-160 MG per tablet 1 tablet  Status:  Discontinued        1 tablet Per Tube Daily 09/29/23 1637 10/03/23 1014   09/29/23 1800  metroNIDAZOLE  (FLAGYL ) IVPB 500 mg  Status:  Discontinued        500 mg 100 mL/hr over 60 Minutes Intravenous Every 12 hours 09/29/23 1716 10/02/23 1140   09/29/23 1400  cefTRIAXone  (ROCEPHIN ) 2 g in sodium chloride  0.9 % 100 mL IVPB        2 g 200 mL/hr over 30 Minutes Intravenous Every 24 hours 09/29/23 1317 10/02/23 1420    09/28/23 1445  fluconazole  (DIFLUCAN ) IVPB 400 mg  Status:  Discontinued        400 mg 100 mL/hr over 120 Minutes Intravenous Daily 09/28/23 1359 10/09/23 1025   09/27/23 1600  cefTRIAXone  (ROCEPHIN ) 2 g in sodium chloride  0.9 % 100 mL IVPB  Status:  Discontinued        2 g 200 mL/hr over 30 Minutes Intravenous Daily 09/27/23 1004 09/29/23 1317   09/27/23 1000  azithromycin  (ZITHROMAX )  tablet 500 mg  Status:  Discontinued        500 mg Oral Daily 09/26/23 2307 09/27/23 1004   09/27/23 1000  bictegravir-emtricitabine -tenofovir  AF (BIKTARVY ) 50-200-25 MG per tablet 1 tablet  Status:  Discontinued        1 tablet Oral Daily 09/27/23 0004 09/30/23 0931   09/27/23 1000  sulfamethoxazole -trimethoprim  (BACTRIM  DS) 800-160 MG per tablet 1 tablet  Status:  Discontinued        1 tablet Oral Daily 09/27/23 0017 09/29/23 1637   09/27/23 0800  ceFEPIme  (MAXIPIME ) 2 g in sodium chloride  0.9 % 100 mL IVPB  Status:  Discontinued        2 g 200 mL/hr over 30 Minutes Intravenous Every 8 hours 09/26/23 2333 09/27/23 1004   09/27/23 0000  fluconazole  (DIFLUCAN ) tablet 400 mg  Status:  Discontinued        400 mg Oral Daily 09/26/23 2352 09/28/23 1359   09/26/23 2300  ceFEPIme  (MAXIPIME ) 2 g in sodium chloride  0.9 % 100 mL IVPB        2 g 200 mL/hr over 30 Minutes Intravenous  Once 09/26/23 2245 09/26/23 2357   09/26/23 2300  vancomycin  (VANCOREADY) IVPB 2000 mg/400 mL        2,000 mg 200 mL/hr over 120 Minutes Intravenous  Once 09/26/23 2245 09/27/23 0305        MEDICATIONS: Scheduled Meds:  aspirin   81 mg Per Tube Daily   atorvastatin   20 mg Per Tube QHS   atovaquone   1,500 mg Per Tube Q lunch   Chlorhexidine  Gluconate Cloth  6 each Topical Daily   clopidogrel   75 mg Per Tube Daily   dolutegravir   50 mg Per Tube Daily   emtricitabine -tenofovir   1 tablet Per Tube Daily   enoxaparin  (LOVENOX ) injection  50 mg Subcutaneous Q24H   famotidine   20 mg Per Tube BID   feeding supplement (OSMOLITE 1.5 CAL)   1,000 mL Per Tube Q24H   feeding supplement (PROSource TF20)  60 mL Per Tube Daily   fiber supplement (BANATROL TF)  60 mL Per Tube BID   fluconazole   400 mg Per Tube Daily   free water   100 mL Per Tube Q4H   Gerhardt's butt cream   Topical BID   insulin  aspart  0-20 Units Subcutaneous Q4H   insulin  aspart  8 Units Subcutaneous 4 times per day   insulin  glargine-yfgn  15 Units Subcutaneous BID   metoprolol  tartrate  50 mg Per Tube BID   multivitamin with minerals  1 tablet Per Tube Daily   nicotine   14 mg Transdermal Daily   pantoprazole  (PROTONIX ) IV  40 mg Intravenous QHS   PARoxetine   20 mg Per Tube Daily   QUEtiapine   50 mg Per Tube QHS   Continuous Infusions:  dextrose  Stopped (10/23/23 0856)   PRN Meds:.acetaminophen  **OR** acetaminophen , alum & mag hydroxide-simeth, bisacodyl , dextrose , fentaNYL  (SUBLIMAZE ) injection, guaiFENesin -dextromethorphan , hydrALAZINE , hydrOXYzine , iohexol , labetalol , levalbuterol , loperamide  HCl, melatonin, ondansetron  **OR** ondansetron  (ZOFRAN ) IV, mouth rinse, oxyCODONE , polyethylene glycol, polyvinyl alcohol    I have personally reviewed following labs and imaging studies  LABORATORY DATA: CBC: Recent Labs  Lab 11/02/23 0727 11/04/23 0316 11/07/23 0534  WBC 4.2 4.7 4.0  HGB 10.3* 10.1* 10.1*  HCT 31.4* 31.2* 31.6*  MCV 91.5 90.7 90.8  PLT 274 294 270    Basic Metabolic Panel: Recent Labs  Lab 11/02/23 0727 11/04/23 0316 11/07/23 0534  NA 135 138 136  K 4.3 4.4 4.0  CL 100  98 102  CO2 25 30 27   GLUCOSE 119* 85 135*  BUN 18 17 16   CREATININE 1.23 1.34* 1.15  CALCIUM  9.2 9.3 9.3  MG  --   --  2.0  PHOS  --   --  3.4    GFR: Estimated Creatinine Clearance (by C-G formula based on SCr of 1.15 mg/dL) Male: 16.1 mL/min Male: 102.8 mL/min  Liver Function Tests: No results for input(s): "AST", "ALT", "ALKPHOS", "BILITOT", "PROT", "ALBUMIN " in the last 168 hours. No results for input(s): "LIPASE", "AMYLASE" in the last 168  hours. No results for input(s): "AMMONIA" in the last 168 hours.  Coagulation Profile: No results for input(s): "INR", "PROTIME" in the last 168 hours.   Cardiac Enzymes: No results for input(s): "CKTOTAL", "CKMB", "CKMBINDEX", "TROPONINI" in the last 168 hours.  BNP (last 3 results) No results for input(s): "PROBNP" in the last 8760 hours.  Lipid Profile: No results for input(s): "CHOL", "HDL", "LDLCALC", "TRIG", "CHOLHDL", "LDLDIRECT" in the last 72 hours.  Thyroid Function Tests: No results for input(s): "TSH", "T4TOTAL", "FREET4", "T3FREE", "THYROIDAB" in the last 72 hours.  Anemia Panel: No results for input(s): "VITAMINB12", "FOLATE", "FERRITIN", "TIBC", "IRON", "RETICCTPCT" in the last 72 hours.  Urine analysis:    Component Value Date/Time   COLORURINE YELLOW 09/25/2023 0052   APPEARANCEUR CLEAR 09/25/2023 0052   LABSPEC 1.028 09/25/2023 0052   PHURINE 5.0 09/25/2023 0052   GLUCOSEU >=500 (A) 09/25/2023 0052   HGBUR SMALL (A) 09/25/2023 0052   BILIRUBINUR NEGATIVE 09/25/2023 0052   KETONESUR NEGATIVE 09/25/2023 0052   PROTEINUR 30 (A) 09/25/2023 0052   UROBILINOGEN 0.2 10/08/2011 1145   NITRITE NEGATIVE 09/25/2023 0052   LEUKOCYTESUR NEGATIVE 09/25/2023 0052    Sepsis Labs: Lactic Acid, Venous    Component Value Date/Time   LATICACIDVEN 1.9 09/29/2023 1747    MICROBIOLOGY: No results found for this or any previous visit (from the past 240 hours).  RADIOLOGY STUDIES/RESULTS: No results found.   LOS: 43 days   Kimberly Penna, MD  [facility]    To contact the attending provider between 7A-7P or the covering provider during after hours 7P-7A, please log into the web site [email or web address] and access using universal [place] password for that web site. If you do not have the password, please call the hospital operator.  11/08/2023, 11:32 AM

## 2023-11-08 NOTE — Progress Notes (Signed)
 Speech Language Pathology Treatment: Dysphagia  Patient Details Name: Jeremiah Stevens MRN: [id number] DOB: [birth year] Today's Date: 11/08/2023 Time: 5643-3295 SLP Time Calculation (min) (ACUTE ONLY): 19 min  Assessment / Plan / Recommendation Clinical Impression  Request from MD if pt's diet can be advanced. Pt was decannulated 4/24- stoma covered but no audible air detected when pt conversing in strong voice. She had hiccups during session which have been intermittent for past 3 days. Pt was able to state and demonstrate super-superglottic swallow strategy adequately throughout session and consumed thin via straw without s/s aspiration. Pt consumed simulated Dys 2 texture of pieces of graham cracker in applesauce. Reviewed with pt PES opening was decreased during MBS and there can be increased residuals with more solid textures reiterating importance of using strategies and alternating liquids and solids to decrease pharyngeal residue. She did note pharyngeal globus sensation with this texture. SLP upgraded texture to Dys 2, continue super-superglottic strategy, multiple swallows, alternate liquids and solids and crush pills in applesauce. ST will follow for safety and efficiency with upgrade and strategies.    HPI HPI: Jeremiah Stevens is a 48 y.o. adult who identifies as male, admitted 09/26/23 with dysphagia x 3 days along with headaches and drooling. She was found to be hypoxic to 80s on room air. CTA showed multifocal PNA.  3/18 she had right facial droop, right sided weakness, dysarthria. MRI demonstrated acute right medullary infarct. Despite ongoing dysphagia (MBS showed moderately severe pharyngeal phase dysphagia and imaging showed unilateral bulging on right), pt insisted on eating. She reportedly ordered pizza overnight 3/18 and certainly aspirated, desaturated, ultimately suffered PEA arrest, 9 minutes ACLS, copious food material and emesis in oropharynx. Self extubated 3/21,  reintubated and extubated 3/26, Trach on 3/31, ATC on 4/4.      SLP Plan  Continue with current plan of care      Recommendations for follow up therapy are one component of a multi-disciplinary discharge planning process, led by the attending physician.  Recommendations may be updated based on patient status, additional functional criteria and insurance authorization.    Recommendations  Diet recommendations: Dysphagia 2 (fine chop);Thin liquid Liquids provided via: Cup;Straw Medication Administration: Crushed with puree Supervision: Intermittent supervision to cue for compensatory strategies Compensations: Slow rate;Small sips/bites;Clear throat after each swallow;Follow solids with liquid;Multiple dry swallows after each bite/sip;Minimize environmental distractions;Other (Comment) (breath hold, cough after swallow) Postural Changes and/or Swallow Maneuvers: Seated upright 90 degrees;Upright 30-60 min after meal                  Oral care BID   Intermittent Supervision/Assistance Dysphagia, pharyngeal phase (R13.13)     Continue with current plan of care     Jeremiah Stevens  11/08/2023, 10:43 AM

## 2023-11-09 DIAGNOSIS — I10 Essential (primary) hypertension: Secondary | ICD-10-CM | POA: Diagnosis not present

## 2023-11-09 DIAGNOSIS — E1165 Type 2 diabetes mellitus with hyperglycemia: Secondary | ICD-10-CM | POA: Diagnosis not present

## 2023-11-09 DIAGNOSIS — J690 Pneumonitis due to inhalation of food and vomit: Secondary | ICD-10-CM | POA: Diagnosis not present

## 2023-11-09 DIAGNOSIS — J9601 Acute respiratory failure with hypoxia: Secondary | ICD-10-CM | POA: Diagnosis not present

## 2023-11-09 LAB — GLUCOSE, CAPILLARY
Glucose-Capillary: 144 mg/dL — ABNORMAL HIGH (ref 70–99)
Glucose-Capillary: 149 mg/dL — ABNORMAL HIGH (ref 70–99)
Glucose-Capillary: 137 mg/dL — ABNORMAL HIGH (ref 70–99)
Glucose-Capillary: 134 mg/dL — ABNORMAL HIGH (ref 70–99)
Glucose-Capillary: 146 mg/dL — ABNORMAL HIGH (ref 70–99)

## 2023-11-09 MED ORDER — EMTRICITABINE-TENOFOVIR DF 200-300 MG PO TABS
1.0000 | ORAL_TABLET | Freq: Every day | ORAL | Status: DC
  Administered 2023-11-10: 1 via ORAL
  Filled 2023-11-09: qty 1

## 2023-11-09 MED ORDER — ENSURE ENLIVE PO LIQD
237.0000 mL | Freq: Three times a day (TID) | ORAL | Status: DC
  Administered 2023-11-09 – 2023-11-10 (×3): 237 mL via ORAL

## 2023-11-09 MED ORDER — ATORVASTATIN CALCIUM 10 MG PO TABS
20.0000 mg | ORAL_TABLET | Freq: Every day | ORAL | Status: DC
  Administered 2023-11-09: 20 mg via ORAL
  Filled 2023-11-09: qty 2

## 2023-11-09 MED ORDER — OSMOLITE 1.5 CAL PO LIQD
1000.0000 mL | ORAL | Status: DC
  Filled 2023-11-09: qty 1000

## 2023-11-09 NOTE — Progress Notes (Signed)
 Speech Language Pathology Treatment: Dysphagia  Patient Details Name: Giomar Rohs MRN: [id number] DOB: 26-Jan-1976 Today's Date: 11/09/2023 Time: 4098-1191 SLP Time Calculation (min) (ACUTE ONLY): 25 min  Assessment / Plan / Recommendation Clinical Impression  Patient seen by SLP for skilled treatment focused on dysphagia goals. Patient was excited to be likely discharging home (lives with brother and his girlfriend) tomorrow. She reported that her stoma seems to be healing well and she has not had to put any pressure on it when talking or coughing. SLP discussed swallow recommendations for diet consistency of Dys 2 minced solids and provided patient with IDDSI(International Dysphagia Diet Standardization Initiative) handout describing minced and moist solids. Patient was strongly encouraged to continue to be diligent with her swallow precautions and with diet texture modifications. She reported that she knows she has to eat small pieces of foods and not eat too quickly. SLP observed her with PO intake of graham crackers and thin liquids (cola). She did not exhibit any immediate s/s but exhibited a delayed cough which sounded congested. After having a cough, her voice returned to baseline which is clear and strong. SLP plans to f/u one more time prior to discharge to complete education schedule permitting. HH SLP recommended.   HPI HPI: Brendyn Terraine Krieger is a 48 y.o. adult who identifies as male, admitted 09/26/23 with dysphagia x 3 days along with headaches and drooling. She was found to be hypoxic to 80s on room air. CTA showed multifocal PNA.  3/18 she had right facial droop, right sided weakness, dysarthria. MRI demonstrated acute right medullary infarct. Despite ongoing dysphagia (MBS showed moderately severe pharyngeal phase dysphagia and imaging showed unilateral bulging on right), pt insisted on eating. She reportedly ordered pizza overnight 3/18 and certainly aspirated, desaturated,  ultimately suffered PEA arrest, 9 minutes ACLS, copious food material and emesis in oropharynx. Self extubated 3/21, reintubated and extubated 3/26, Trach on 3/31, ATC on 4/4. Decannulated on 4/24.      SLP Plan  Continue with current plan of care      Recommendations for follow up therapy are one component of a multi-disciplinary discharge planning process, led by the attending physician.  Recommendations may be updated based on patient status, additional functional criteria and insurance authorization.    Recommendations  Diet recommendations: Thin liquid;Dysphagia 2 (fine chop) Liquids provided via: Cup;Straw Medication Administration: Crushed with puree Supervision: Intermittent supervision to cue for compensatory strategies Compensations: Slow rate;Small sips/bites;Clear throat after each swallow;Follow solids with liquid;Multiple dry swallows after each bite/sip;Minimize environmental distractions;Other (Comment) Postural Changes and/or Swallow Maneuvers: Seated upright 90 degrees;Upright 30-60 min after meal                  Oral care BID   Intermittent Supervision/Assistance Dysphagia, pharyngeal phase (R13.13)     Continue with current plan of care     Jacqualine Mater, MA, CCC-SLP Speech Therapy

## 2023-11-09 NOTE — Plan of Care (Signed)
  Problem: Health Behavior/Discharge Planning: Goal: Ability to manage health-related needs will improve Outcome: Progressing   Problem: Metabolic: Goal: Ability to maintain appropriate glucose levels will improve Outcome: Progressing   Problem: Nutritional: Goal: Maintenance of adequate nutrition will improve Outcome: Progressing Goal: Progress toward achieving an optimal weight will improve Outcome: Progressing

## 2023-11-09 NOTE — Progress Notes (Signed)
 Physical Therapy Treatment Patient Details Name: Jeremiah Stevens MRN: [id number] DOB: 1976-01-18 Today's Date: 11/09/2023   History of Present Illness 48 yo pt admitted to [facility] 09/26/23 for difficulty swallowing. 3/19 PEA, ROSC after 2 min, then bradycardic arrest 1 min later, CPR resumed, pt intubated, and ROSC 6 min later. Seizure-like activity also noted briefly after ROSC. 3/20 pt transferred to [facility]. 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 3/21 self extubated and reintubated. 3/31 trach. Tracheostomy changed to #6 cuffless 4/14. S/p PEG 4/18. 4/24: Trach decannulated. PMHx: HIV/AIDS, HTN, herpes, migraine, depression, DM II, polysubstance use, and seizure.    PT Comments  Continues to make excellent progress. Increasing dynamic balance/gait challenges at a supervision level, ambulating with only one episode of staggering and able to self correct. >300 feet tolerated today with SpO2 90% and greater throughout, HR peaked at 124bpm. Navigates stairs at [facility] level assist and single rail for support. Patient will continue to benefit from skilled physical therapy services to further improve independence with functional mobility.    If plan is discharge home, recommend the following: Assistance with cooking/housework;Assist for transportation;Help with stairs or ramp for entrance   Can travel by private vehicle     Yes  Equipment Recommendations  None recommended by PT (States she has a SPC at home and will use.)    Recommendations for Other Services       Precautions / Restrictions Precautions Precautions: Fall;Other (comment) Recall of Precautions/Restrictions: Intact Precaution/Restrictions Comments: PEG Required Braces or Orthoses: Other Brace Other Brace: Abdominal binder Restrictions Weight Bearing Restrictions Per Provider Order: No     Mobility  Bed Mobility Overal bed mobility: Modified Independent             General bed mobility  comments: Mod I, extra time, no assist    Transfers Overall transfer level: Modified independent Equipment used: None               General transfer comment: Mod I, slow to rise but stabilizes without intervention.    Ambulation/Gait Ambulation/Gait assistance: Supervision Gait Distance (Feet): 375 Feet Assistive device: None Gait Pattern/deviations: Step-through pattern, Decreased stride length, Drifts right/left, Staggering left Gait velocity: decreased Gait velocity interpretation: 1.31 - 2.62 ft/sec, indicative of limited community ambulator   General Gait Details: Supervision for safety, some increased instability noted with dynamic gait challenges. One episode of stumbling but able to self correct. Educated on findings, fall risk, and recommendations. HR peaked at 124, SpO2 >90% throughout duration with moderate dyspnea.   Stairs Stairs: Yes Stairs assistance: Contact guard assist Stair Management: One rail Right, Forwards, Step to pattern, Alternating pattern Number of Stairs: 13 General stair comments: Reviewed stair navigation safety. Completed full fligth with single rail on Rt. Step to/alternating steps going up. Step to pattern on descent. No buckling or overt LOB. CGA for safety.   Wheelchair Mobility     Tilt Bed    Modified Rankin (Stroke Patients Only) Modified Rankin (Stroke Patients Only) Pre-Morbid Rankin Score: No symptoms Modified Rankin: Moderate disability     Balance Overall balance assessment: Needs assistance Sitting-balance support: Feet supported Sitting balance-Leahy Scale: Good     Standing balance support: No upper extremity supported, During functional activity Standing balance-Leahy Scale: Fair                   Standardized Balance Assessment Standardized Balance Assessment : Dynamic Gait Index   Dynamic Gait Index Level Surface: Normal Change in Gait  Speed: Normal Gait with Horizontal Head Turns: Mild  Impairment Gait with Vertical Head Turns: Mild Impairment Gait and Pivot Turn: Mild Impairment Step Over Obstacle: Moderate Impairment Step Around Obstacles: Mild Impairment Steps: Moderate Impairment Total Score: 16      Communication Communication Communication: Impaired Factors Affecting Communication: Reduced clarity of speech  Cognition Arousal: Alert Behavior During Therapy: WFL for tasks assessed/performed   PT - Cognitive impairments: No apparent impairments                         Following commands: Intact Following commands impaired: Follows multi-step commands inconsistently    Cueing Cueing Techniques: Verbal cues  Exercises      General Comments        Pertinent Vitals/Pain Pain Assessment Pain Assessment: No/denies pain    Home Living                          Prior Function            PT Goals (current goals can now be found in the care plan section) Acute Rehab PT Goals Patient Stated Goal: Return Home PT Goal Formulation: With patient Time For Goal Achievement: 11/17/23 Potential to Achieve Goals: Good Progress towards PT goals: Progressing toward goals    Frequency    Min 2X/week      PT Plan      Co-evaluation              AM-PAC PT "6 Clicks" Mobility   Outcome Measure  Help needed turning from your back to your side while in a flat bed without using bedrails?: None Help needed moving from lying on your back to sitting on the side of a flat bed without using bedrails?: None Help needed moving to and from a bed to a chair (including a wheelchair)?: None Help needed standing up from a chair using your arms (e.g., wheelchair or bedside chair)?: None Help needed to walk in hospital room?: A Little Help needed climbing 3-5 steps with a railing? : A Little 6 Click Score: 22    End of Session Equipment Utilized During Treatment: Gait belt Activity Tolerance: Patient tolerated treatment well Patient left:  with call bell/phone within reach;in chair   PT Visit Diagnosis: Unsteadiness on feet (R26.81);Muscle weakness (generalized) (M62.81);Other abnormalities of gait and mobility (R26.89);Difficulty in walking, not elsewhere classified (R26.2);Other symptoms and signs involving the nervous system (R29.898) Hemiplegia - Right/Left: Right Hemiplegia - dominant/non-dominant: Non-dominant Hemiplegia - caused by: Cerebral infarction     Time: 1010-1025 PT Time Calculation (min) (ACUTE ONLY): 15 min  Charges:    $Gait Training: 8-22 mins PT General Charges $$ ACUTE PT VISIT: 1 Visit                     Jory Ng, PT, DPT [facility]  Rehabilitation Services Physical Therapist Office: [phone] Website: [email or web address]    Alinda Irani 11/09/2023, 1:47 PM

## 2023-11-09 NOTE — Progress Notes (Signed)
 Patient with g-tube placed in IR 10/29/23. IR received phone call from Dr. Hilton Lucky who requested IR evaluate patient for g-tube removal as she has recovered and is taking nutrition orally. Dr. Hilton Lucky notified that g-tube must remain in place for 6 weeks.   An order has been placed for the patient to return to IR in 6 weeks for g-tube removal as an outpatient.  Jetta Morrow, AGACNP-BC 11/09/2023, 11:42 AM

## 2023-11-09 NOTE — Progress Notes (Signed)
 PROGRESS NOTE        PATIENT DETAILS Name: Jeremiah Stevens Age: 48 y.o. Sex: adult Date of Birth: [birth year] Admit Date: 09/26/2023 Admitting Physician No admitting provider for patient encounter. WUJ:[id number], Adah Acron, MD  Brief Summary: 48 y.o. adult who with medical of DM2, seizures, HIV AIDS untreated, cryptococcal disease, obesity, polysubstance abuse, prior CVA who was admitted on 3/16 with dysphagia/drooling- found to be hypoxic-further evaluation revealed multifocal PNA.  Patient was started on antibiotics and admitted to the hospitalist service-Hospital course complicated by acute CVA-despite ongoing dysphagia-patient was noncompliant with diet (threatening to leave AMA while stroke workup ongoing)-reportedly ordered a pizza and had an aspiration event-with worsening hypoxemia-requiring transfer to the ICU where he had a PEA arrest-requiring 9 minutes of ACLS prior to ROSC.  During intubation-copious amounts of food material/emesis was observed in the oropharynx.  Patient had seizure-like activity after intubation.  He was subsequently managed in the ICU-was difficult to wean off the ventilator-he self extubated but had to be reintubated-ultimately ENT performed tracheostomy on 3/31.  He was transferred back to [facility] service on 4/7.  Significant events: 3/16 admit 3/18 neuro consult, found to have acute CVA 3/19 Ongoing aspiration despite obvious dysphagia, was planning to leave AMA but sister talked pt into staying. then had a massive aspiration event leading to worse hypoxia and PEA arrest. Txf to [facility] 3/20 PSV cumulative total of 5-6 hrs, following some commands/ purposefully 3/21 self extubated, was reintubated 3/23 Failed SBT due to WOB 3/24 Failed SBT due to apnea 3/25 Tolerated PS >4 hours 3/26: Intubated. 3/31: S/p tracheostomy with ENT. 4/4: trach collar starting 1200  4/5 cont on trach collar  4/7: Care transferred from [facility] to [facility] 4/8:  Modified barium swallow.  Failed.  NPO. 4/19: PEG tube insertion by IR 4/24: Trach decannulated by [facility] 4/28: Diet advanced to dysphagia 2  Significant studies: 3/15>> CT head: No acute intracranial abnormality. 3/16>> CTA chest: No PE 3/19>> MRI brain: Acute right medullary infarct. 3/19>> CT angio head/neck: No LVO 3/19>> LDL: 95 3/19>> A1c: 10.8 3/19>> echo: EF 60-65%, grade 1 diastolic dysfunction. 3/20-3/21>> LTM EEG: No seizures. 4/12>> CT abdomen: Gastric anatomy is borderline for percutaneous gastrostomy tube placement. 4/17>> CTA chest: No PE. 4/17>> B/L lower extremity Doppler: No DVT.  Significant microbiology data: 3/16>> COVID/influenza/RSV PCR: Negative 3/16>> blood culture: No growth 3/17>> sputum culture: Normal respiratory flora 3/19>> tracheal aspirate: No organisms.   3/20>> sputum culture: Normal respiratory flora 3/27>> blood culture: No growth 4/01>> tracheal aspirate: Normal respiratory flora 4/02>> blood culture: No growth  Procedures: 4/18>> PEG tube by IR.  Consults: Neurology PCCM  Subjective: No major issues overnight-tolerating upgrading diet-dysphagia 2 diet.  Inquiring about discharge plans.  Objective: Vitals: Blood pressure (!) 146/96, pulse 90, temperature (!) 97.5 F (36.4 C), temperature source Oral, resp. rate 20, height 6' (1.829 m), weight 114.2 kg, SpO2 100%.   Exam: Awake/alert Nonfocal exam Chest is clear to auscultation.  Pertinent Labs/Radiology:    Latest Ref Rng & Units 11/07/2023    5:34 AM 11/04/2023    3:16 AM 11/02/2023    7:27 AM  CBC  WBC 4.0 - 10.5 K/uL 4.0  4.7  4.2   Hemoglobin 13.0 - 17.0 g/dL 78.2  95.6  21.3   Hematocrit 39.0 - 52.0 % 31.6  31.2  31.4   Platelets 150 - 400  K/uL 270  294  274     Lab Results  Component Value Date   NA 136 11/07/2023   K 4.0 11/07/2023   CL 102 11/07/2023   CO2 27 11/07/2023      Assessment/Plan: Acute hypoxic respiratory failure secondary to multifocal  pneumonia and subsequently an aspiration event Much better-weaned off the vent-required trach on 3/31-but subsequently decannulated on 4/24 Currently on room air Has completed a course of antibiotics.    PEA arrest Secondary to aspiration event/possible seizures Telemetry monitoring Echo stable No significant anoxic issues-follows commands-moving all 4 extremities-speech clear and fluent.  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 Continue statin  Dysphagia Likely oral pharyngeal dysphagia in the setting of CVA. Failed MBS-PEG inserted by IR on 4/5 SLP following-advance to dysphagia 2 diet today Tolerating nocturnal feeding through PEG Tolerating dysphagia 2 diet-spoke with IR team-unfortunately PEG tube has to stay in place for 6 weeks-they will arrange for follow-up in the outpatient setting before removing PEG tube. Will discuss with dietitian regarding changing tube feeds to bolus regimen-for ease of use in the outpatient setting.  AKI Likely hemodynamically mediated Resolved.  Chronic HFpEF Euvolemic  Chest pain Elevated D-dimer No further chest pain CTA chest/Doppler study negative for VTE. On SQ Lovenox  prophylactic dose.  HTN BP stable Continue metoprolol   HIV/AIDS Continue antiretrovirals On Mepron  for PJP prophylaxis Previously noncompliant to medications. Will reach out to ID to see if we can get a outpatient follow-up at the HIV clinic.  History of cryptococcal antigenemia  On fluconazole .  Normocytic anemia Due to critical illness/chronic disease Hb stable-follow periodically.  DM-2 (A1c 10.8 on 3/19) CBGs stable Semglee  15 units twice daily+ 8 units of NovoLog  4 times daily+SSI  Recent Labs    11/08/23 2321 11/09/23 0329 11/09/23 0858  GLUCAP  167* 144* 149*    GERD Pepcid   Anxiety/depression Stable Continue Paxil  and Seroquel .  Medical noncompliance Previously noncompliant to ART's Has been noncompliant with diet-had a significant aspiration event. Per prior notes-has pulled out feeding tube-has attempted to pull a tracheotomy-prior MD's has explained the risk of sudden aspiration/respiratory arrest/death.  Nutrition Status: Nutrition Problem: Increased nutrient needs Etiology: chronic illness Signs/Symptoms: estimated needs Interventions: Tube feeding, Prostat  Class 1 Obesity: Estimated body mass index is 34.15 kg/m as calculated from the following:   Height as of this encounter: 6' (1.829 m).   Weight as of this encounter: 114.2 kg.   Code status:   Code Status: Full Code   DVT Prophylaxis: SQ Lovenox    Family Communication: Sister-Korie Rumpf-[phone] updated over the phone 4/29   Disposition Plan: Status is: Inpatient Remains inpatient appropriate because: Severity of illness   Planned Discharge Destination: Home with outpatient physical therapy likely on 4/30.   Diet: Diet Order             DIET DYS 2 Room service appropriate? Yes with Assist; Fluid consistency: Thin  Diet effective now                     Antimicrobial agents: Anti-infectives (From admission, onward)    Start     Dose/Rate Route Frequency Ordered Stop   11/10/23 1000  emtricitabine -tenofovir  (TRUVADA ) 200-300 MG per tablet 1 tablet        1 tablet Oral Daily 11/09/23 1010     11/09/23 1200  atovaquone  (MEPRON ) 750 MG/5ML suspension 1,500 mg        1,500 mg Oral Daily with lunch 11/08/23 1228     11/09/23 1000  dolutegravir  (TIVICAY ) tablet 50 mg        50 mg Oral Daily 11/08/23 1228     11/09/23 1000  fluconazole  (DIFLUCAN ) tablet 400 mg        400 mg Oral Daily 11/08/23 1228     10/29/23 1200  fluconazole  (DIFLUCAN ) 40 MG/ML suspension 400 mg  Status:  Discontinued        400 mg Per Tube  Once 10/29/23 1106  10/29/23 1217   10/29/23 0830  vancomycin  (VANCOCIN ) IVPB 1000 mg/200 mL premix        over 60 Minutes Intravenous Continuous PRN 10/29/23 0838 10/29/23 0830   10/29/23 0000  vancomycin  (VANCOREADY) IVPB 1500 mg/300 mL        1,500 mg 150 mL/hr over 120 Minutes Intravenous To Radiology 10/26/23 1559 10/29/23 0325   10/15/23 1000  emtricitabine -tenofovir  (TRUVADA ) 200-300 MG per tablet 1 tablet  Status:  Discontinued        1 tablet Per Tube Daily 10/15/23 0836 11/09/23 1010   10/15/23 0900  ceFEPIme  (MAXIPIME ) 2 g in sodium chloride  0.9 % 100 mL IVPB        2 g 200 mL/hr over 30 Minutes Intravenous Every 8 hours 10/15/23 0846 10/22/23 0959   10/14/23 1000  emtricitabine -tenofovir  (TRUVADA ) 200-300 MG per tablet 1 tablet  Status:  Discontinued        1 tablet Oral Daily 10/13/23 1340 10/15/23 0836   10/13/23 0100  ceFEPIme  (MAXIPIME ) 2 g in sodium chloride  0.9 % 100 mL IVPB  Status:  Discontinued        2 g 200 mL/hr over 30 Minutes Intravenous Every 8 hours 10/12/23 2352 10/15/23 0846   10/13/23 0100  metroNIDAZOLE  (FLAGYL ) IVPB 500 mg  Status:  Discontinued        500 mg 100 mL/hr over 60 Minutes Intravenous Every 12 hours 10/12/23 2352 10/15/23 0846   10/09/23 1200  atovaquone  (MEPRON ) 750 MG/5ML suspension 1,500 mg  Status:  Discontinued        1,500 mg Per Tube Daily with lunch 10/09/23 1025 11/08/23 1229   10/09/23 1115  fluconazole  (DIFLUCAN ) tablet 400 mg  Status:  Discontinued        400 mg Per Tube Daily 10/09/23 1025 11/08/23 1229   10/03/23 1100  clindamycin  (CLEOCIN ) IVPB 900 mg  Status:  Discontinued        900 mg 100 mL/hr over 30 Minutes Intravenous Every 8 hours 10/03/23 1014 10/09/23 1025   10/03/23 1100  primaquine  tablet 30 mg  Status:  Discontinued        30 mg Per NG tube Daily 10/03/23 1014 10/09/23 1025   10/02/23 2200  metroNIDAZOLE  (FLAGYL ) tablet 500 mg  Status:  Discontinued        500 mg Per Tube Every 12 hours 10/02/23 1140 10/03/23 1014   09/30/23 1030   dolutegravir  (TIVICAY ) tablet 50 mg  Status:  Discontinued        50 mg Per Tube Daily 09/30/23 0931 11/08/23 1229   09/30/23 1030  emtricitabine -tenofovir  AF (DESCOVY ) 200-25 MG per tablet 1 tablet  Status:  Discontinued        1 tablet Per Tube Daily 09/30/23 0931 10/13/23 1340   09/30/23 1000  sulfamethoxazole -trimethoprim  (BACTRIM  DS) 800-160 MG per tablet 1 tablet  Status:  Discontinued  1 tablet Per Tube Daily 09/29/23 1637 10/03/23 1014   09/29/23 1800  metroNIDAZOLE  (FLAGYL ) IVPB 500 mg  Status:  Discontinued        500 mg 100 mL/hr over 60 Minutes Intravenous Every 12 hours 09/29/23 1716 10/02/23 1140   09/29/23 1400  cefTRIAXone  (ROCEPHIN ) 2 g in sodium chloride  0.9 % 100 mL IVPB        2 g 200 mL/hr over 30 Minutes Intravenous Every 24 hours 09/29/23 1317 10/02/23 1420   09/28/23 1445  fluconazole  (DIFLUCAN ) IVPB 400 mg  Status:  Discontinued        400 mg 100 mL/hr over 120 Minutes Intravenous Daily 09/28/23 1359 10/09/23 1025   09/27/23 1600  cefTRIAXone  (ROCEPHIN ) 2 g in sodium chloride  0.9 % 100 mL IVPB  Status:  Discontinued        2 g 200 mL/hr over 30 Minutes Intravenous Daily 09/27/23 1004 09/29/23 1317   09/27/23 1000  azithromycin  (ZITHROMAX ) tablet 500 mg  Status:  Discontinued        500 mg Oral Daily 09/26/23 2307 09/27/23 1004   09/27/23 1000  bictegravir-emtricitabine -tenofovir  AF (BIKTARVY ) 50-200-25 MG per tablet 1 tablet  Status:  Discontinued        1 tablet Oral Daily 09/27/23 0004 09/30/23 0931   09/27/23 1000  sulfamethoxazole -trimethoprim  (BACTRIM  DS) 800-160 MG per tablet 1 tablet  Status:  Discontinued        1 tablet Oral Daily 09/27/23 0017 09/29/23 1637   09/27/23 0800  ceFEPIme  (MAXIPIME ) 2 g in sodium chloride  0.9 % 100 mL IVPB  Status:  Discontinued        2 g 200 mL/hr over 30 Minutes Intravenous Every 8 hours 09/26/23 2333 09/27/23 1004   09/27/23 0000  fluconazole  (DIFLUCAN ) tablet 400 mg  Status:  Discontinued        400 mg Oral Daily  09/26/23 2352 09/28/23 1359   09/26/23 2300  ceFEPIme  (MAXIPIME ) 2 g in sodium chloride  0.9 % 100 mL IVPB        2 g 200 mL/hr over 30 Minutes Intravenous  Once 09/26/23 2245 09/26/23 2357   09/26/23 2300  vancomycin  (VANCOREADY) IVPB 2000 mg/400 mL        2,000 mg 200 mL/hr over 120 Minutes Intravenous  Once 09/26/23 2245 09/27/23 0305        MEDICATIONS: Scheduled Meds:  aspirin   81 mg Oral Daily   atorvastatin   20 mg Per Tube QHS   atovaquone   1,500 mg Oral Q lunch   Chlorhexidine  Gluconate Cloth  6 each Topical Daily   clopidogrel   75 mg Oral Daily   dolutegravir   50 mg Oral Daily   [START ON 11/10/2023] emtricitabine -tenofovir   1 tablet Oral Daily   enoxaparin  (LOVENOX ) injection  50 mg Subcutaneous Q24H   famotidine   20 mg Oral BID   feeding supplement (OSMOLITE 1.5 CAL)  1,000 mL Per Tube Q24H   feeding supplement (PROSource TF20)  60 mL Per Tube Daily   fluconazole   400 mg Oral Daily   free water   100 mL Per Tube Q4H   Gerhardt's butt cream   Topical BID   insulin  aspart  0-20 Units Subcutaneous Q4H   insulin  aspart  8 Units Subcutaneous 4 times per day   insulin  glargine-yfgn  15 Units Subcutaneous BID   metoprolol  tartrate  50 mg Oral BID   multivitamin with minerals  1 tablet Oral Daily   nicotine   14 mg Transdermal Daily   pantoprazole  (PROTONIX )  IV  40 mg Intravenous QHS   PARoxetine   20 mg Oral Daily   QUEtiapine   50 mg Oral QHS   Continuous Infusions:  dextrose  Stopped (10/23/23 0856)   PRN Meds:.acetaminophen  **OR** acetaminophen , alum & mag hydroxide-simeth, bisacodyl , dextrose , fentaNYL  (SUBLIMAZE ) injection, guaiFENesin -dextromethorphan , hydrALAZINE , hydrOXYzine , iohexol , labetalol , levalbuterol , loperamide  HCl, melatonin, ondansetron  **OR** ondansetron  (ZOFRAN ) IV, mouth rinse, oxyCODONE , polyethylene glycol, polyvinyl alcohol    I have personally reviewed following labs and imaging studies  LABORATORY DATA: CBC: Recent Labs  Lab 11/04/23 0316  11/07/23 0534  WBC 4.7 4.0  HGB 10.1* 10.1*  HCT 31.2* 31.6*  MCV 90.7 90.8  PLT 294 270    Basic Metabolic Panel: Recent Labs  Lab 11/04/23 0316 11/07/23 0534  NA 138 136  K 4.4 4.0  CL 98 102  CO2 30 27  GLUCOSE 85 135*  BUN 17 16  CREATININE 1.34* 1.15  CALCIUM  9.3 9.3  MG  --  2.0  PHOS  --  3.4    GFR: Estimated Creatinine Clearance (by C-G formula based on SCr of 1.15 mg/dL) Male: 47.8 mL/min Male: 103.6 mL/min  Liver Function Tests: No results for input(s): "AST", "ALT", "ALKPHOS", "BILITOT", "PROT", "ALBUMIN " in the last 168 hours. No results for input(s): "LIPASE", "AMYLASE" in the last 168 hours. No results for input(s): "AMMONIA" in the last 168 hours.  Coagulation Profile: No results for input(s): "INR", "PROTIME" in the last 168 hours.   Cardiac Enzymes: No results for input(s): "CKTOTAL", "CKMB", "CKMBINDEX", "TROPONINI" in the last 168 hours.  BNP (last 3 results) No results for input(s): "PROBNP" in the last 8760 hours.  Lipid Profile: No results for input(s): "CHOL", "HDL", "LDLCALC", "TRIG", "CHOLHDL", "LDLDIRECT" in the last 72 hours.  Thyroid Function Tests: No results for input(s): "TSH", "T4TOTAL", "FREET4", "T3FREE", "THYROIDAB" in the last 72 hours.  Anemia Panel: No results for input(s): "VITAMINB12", "FOLATE", "FERRITIN", "TIBC", "IRON", "RETICCTPCT" in the last 72 hours.  Urine analysis:    Component Value Date/Time   COLORURINE YELLOW 09/25/2023 0052   APPEARANCEUR CLEAR 09/25/2023 0052   LABSPEC 1.028 09/25/2023 0052   PHURINE 5.0 09/25/2023 0052   GLUCOSEU >=500 (A) 09/25/2023 0052   HGBUR SMALL (A) 09/25/2023 0052   BILIRUBINUR NEGATIVE 09/25/2023 0052   KETONESUR NEGATIVE 09/25/2023 0052   PROTEINUR 30 (A) 09/25/2023 0052   UROBILINOGEN 0.2 10/08/2011 1145   NITRITE NEGATIVE 09/25/2023 0052   LEUKOCYTESUR NEGATIVE 09/25/2023 0052    Sepsis Labs: Lactic Acid, Venous    Component Value Date/Time   LATICACIDVEN  1.9 09/29/2023 1747    MICROBIOLOGY: No results found for this or any previous visit (from the past 240 hours).  RADIOLOGY STUDIES/RESULTS: No results found.   LOS: 44 days   Kimberly Penna, MD  [facility]    To contact the attending provider between 7A-7P or the covering provider during after hours 7P-7A, please log into the web site [email or web address] and access using universal [place] password for that web site. If you do not have the password, please call the hospital operator.  11/09/2023, 11:34 AM

## 2023-11-09 NOTE — Progress Notes (Addendum)
 Nutrition Follow-up  DOCUMENTATION CODES:  Obesity unspecified  INTERVENTION:  Modify current tube feeding regimen via PEG: Equate Plus PRN bolus feedings, if <50% of meal consumed. Each supplement provides 350 kcals, 13g protein  Continue MVI with minerals daily via tube  Discontinue Banatrol TF BID via tube  Continue Ensure Enlive po TID while admitted, each supplement provides 350 kcal and 20 grams of protein Initiate calorie count to assess intake  NUTRITION DIAGNOSIS:  Increased nutrient needs related to chronic illness as evidenced by estimated needs. - remains applicable  GOAL:  Patient will meet greater than or equal to 90% of their needs - progressing  MONITOR:  TF tolerance  REASON FOR ASSESSMENT:  Consult Enteral/tube feeding initiation and management  ASSESSMENT:  48 y.o. adult with medical history significant of diabetes type 2, seizure disorder, HIV untreated, obesity, polysubstance abuse .  Patient is admitted for pneumonia with hypoxia in the setting of HIV.  Previous Admission 2/12 Admitted for metabolic encephalopathy 2/14 - Cortrak placed 2/15 - Cortrak removed, Passed swallow eval, carb modified diet  Current Admission 3/16 - Admitted to [facility] for PNA 3/18 - MBS showed chronic aspiration of secretions, no diet formally recommended, MRI showed acute CVA 3/19 - PEA arrest and intubation s/p aspiration event, txr to ICU 3/20 - transferred to [facility] for continuous EEG monitoring, trickles initiated 3/21 - cortrak tube placed, pt self-extubated, emergently reintubated 3/23 - SBT: failed 3/24 - SBT: failed 3/25 - tolerated PS >4 hours 3/26 - intubated  3/29 - pt TF rate increased r/t feeling hungry 3/31 - tracheostomy placed in OR with ENT 4/4 - transitioned to trach collar  4/7 - txr to floor from ICU  4/8 - MBSS: Failed - NPO 4/9 - trach changed to #8 Shiley cuffed trach 4/14 - trach changed and reduced in size (#6 cuffless) 4/18 - PEG tube  placement  4/22 - advanced to full liquid diet 4/24 - trach decannulated 4/28 - diet advanced to DYS2/thins  Pt advanced to dysphagia 2 diet yesterday. No recent intake documentation to review. MD endorses vigorous intake with breakfast this morning. Patient confirms this as well as with dinner last night. No N/V/C/D endorsed post intake.  Pt likely to discharge tomorrow. PEG cannot be removed for another six weeks, per MD. Will recommend transition to PRN boluses at this time to allow her to meet needs orally. Will start calorie count to assess needs while still admitted and make adjustments to recommended discharge regimen, if indicated.  Spoke to patient's sister about purchasing small quantity of Equate Plus to have on hand (if <50% of meals consumed) to administer, if needed. Sister is amicable to this plan. Will recommend nutrition assessment outpatient at follow up appointment(s).    Admit Weight: 115.8kg Current Weight: 114.2kg Lowest Weight: 104.9kg on 3/29   No significant edema noted. Adequate weight to frame observed. Wt trending back up toward admission weight. No new or significant skin breakdown. Recommend d/c Banatol as last BM was 4/25. Miralax  ordered PRN.   Drains/Lines: LUQ: PEG (20Fr) placed on 4/18   Insulin  coverage stable and effective. MVI continues.  Meds: tivicay , famotidine , fluconazole , SSI 0-20 q4, SSI 8 QID, Semglee  15 BID, MVI, pantoprazole , paroxetine    Labs: no new labs for review  Diet Order:   Diet Order             DIET DYS 2 Room service appropriate? Yes with Assist; Fluid consistency: Thin  Diet effective now  EDUCATION NEEDS:  Education needs have been addressed  Skin:  Skin Assessment: Reviewed RN Assessment  Last BM:  4/24  Height:  Ht Readings from Last 1 Encounters:  10/07/23 6' (1.829 m)   Weight:  Wt Readings from Last 1 Encounters:  11/09/23 114.2 kg    Ideal Body Weight:  80.9 kg  BMI:  Body mass index is  34.15 kg/m.  Estimated Nutritional Needs:   Kcal:  2200-2500 kcal/d  Protein:  110-120g  Fluid:  2.4L/day  Con Decant MS, RD, LDN Registered Dietitian Clinical Nutrition RD Inpatient Contact Info in Amion

## 2023-11-09 NOTE — Progress Notes (Signed)
 OT Cancellation Note  Patient Details Name: Mamie Pecore MRN: [id number] DOB: 04/23/1976   Cancelled Treatment:    Reason Eval/Treat Not Completed: Fatigue/lethargy limiting ability to participate Pt sleeping upon therapy arrival and unable to participate in OT treatment session at this time. Will follow up with pt tomorrow.   Carollee Circle, OTR/L,CBIS  Supplemental OT - [facility] and [facility] Secure Chat Preferred   11/09/2023, 2:27 PM

## 2023-11-10 ENCOUNTER — Other Ambulatory Visit (HOSPITAL_COMMUNITY): Payer: Self-pay

## 2023-11-10 DIAGNOSIS — J9601 Acute respiratory failure with hypoxia: Secondary | ICD-10-CM | POA: Diagnosis not present

## 2023-11-10 DIAGNOSIS — J690 Pneumonitis due to inhalation of food and vomit: Secondary | ICD-10-CM | POA: Diagnosis not present

## 2023-11-10 DIAGNOSIS — E1165 Type 2 diabetes mellitus with hyperglycemia: Secondary | ICD-10-CM | POA: Diagnosis not present

## 2023-11-10 DIAGNOSIS — I639 Cerebral infarction, unspecified: Secondary | ICD-10-CM | POA: Diagnosis not present

## 2023-11-10 LAB — GLUCOSE, CAPILLARY
Glucose-Capillary: 108 mg/dL — ABNORMAL HIGH (ref 70–99)
Glucose-Capillary: 124 mg/dL — ABNORMAL HIGH (ref 70–99)
Glucose-Capillary: 127 mg/dL — ABNORMAL HIGH (ref 70–99)
Glucose-Capillary: 148 mg/dL — ABNORMAL HIGH (ref 70–99)
Glucose-Capillary: 161 mg/dL — ABNORMAL HIGH (ref 70–99)

## 2023-11-10 LAB — T-HELPER CELLS (CD4) COUNT (NOT AT ARMC)
CD4 % Helper T Cell: 12 % — ABNORMAL LOW (ref 33–65)
CD4 T Cell Abs: 172 /uL — ABNORMAL LOW (ref 400–1790)

## 2023-11-10 MED ORDER — ATORVASTATIN CALCIUM 20 MG PO TABS
20.0000 mg | ORAL_TABLET | Freq: Every day | ORAL | 1 refills | Status: DC
  Filled 2023-11-10: qty 30, 30d supply, fill #0

## 2023-11-10 MED ORDER — POLYETHYLENE GLYCOL 3350 17 GM/SCOOP PO POWD
17.0000 g | Freq: Every day | ORAL | 0 refills | Status: DC | PRN
  Filled 2023-11-10: qty 238, 14d supply, fill #0

## 2023-11-10 MED ORDER — CLOPIDOGREL BISULFATE 75 MG PO TABS
75.0000 mg | ORAL_TABLET | Freq: Every day | ORAL | 0 refills | Status: AC
  Filled 2023-11-10: qty 49, 49d supply, fill #0

## 2023-11-10 MED ORDER — QUETIAPINE FUMARATE 50 MG PO TABS
50.0000 mg | ORAL_TABLET | Freq: Every day | ORAL | 1 refills | Status: DC
  Filled 2023-11-10: qty 30, 30d supply, fill #0

## 2023-11-10 MED ORDER — ASPIRIN 81 MG PO CHEW
81.0000 mg | CHEWABLE_TABLET | Freq: Every day | ORAL | 1 refills | Status: DC
  Filled 2023-11-10: qty 30, 30d supply, fill #0

## 2023-11-10 MED ORDER — PANTOPRAZOLE SODIUM 40 MG PO TBEC
40.0000 mg | DELAYED_RELEASE_TABLET | Freq: Every day | ORAL | 1 refills | Status: DC
  Filled 2023-11-10: qty 30, 30d supply, fill #0

## 2023-11-10 MED ORDER — INSULIN GLARGINE 100 UNIT/ML SOLOSTAR PEN
15.0000 [IU] | PEN_INJECTOR | Freq: Two times a day (BID) | SUBCUTANEOUS | 1 refills | Status: DC
  Filled 2023-11-10: qty 24, 80d supply, fill #0

## 2023-11-10 MED ORDER — DOLUTEGRAVIR SODIUM 50 MG PO TABS
50.0000 mg | ORAL_TABLET | Freq: Every day | ORAL | 1 refills | Status: DC
  Filled 2023-11-10: qty 30, 30d supply, fill #0

## 2023-11-10 MED ORDER — ATOVAQUONE 750 MG/5ML PO SUSP
1500.0000 mg | Freq: Every day | ORAL | 1 refills | Status: DC
  Filled 2023-11-10: qty 210, 21d supply, fill #0

## 2023-11-10 MED ORDER — FLUCONAZOLE 200 MG PO TABS
400.0000 mg | ORAL_TABLET | Freq: Every day | ORAL | 1 refills | Status: DC
  Filled 2023-11-10: qty 60, 30d supply, fill #0

## 2023-11-10 MED ORDER — BIKTARVY 50-200-25 MG PO TABS
1.0000 | ORAL_TABLET | Freq: Every day | ORAL | 2 refills | Status: AC
  Filled 2023-11-10: qty 30, 30d supply, fill #0

## 2023-11-10 MED ORDER — EMTRICITABINE-TENOFOVIR DF 200-300 MG PO TABS
1.0000 | ORAL_TABLET | Freq: Every day | ORAL | 1 refills | Status: DC
  Filled 2023-11-10: qty 30, 30d supply, fill #0

## 2023-11-10 MED ORDER — INSULIN PEN NEEDLE 32G X 4 MM MISC
1.0000 | 0 refills | Status: DC | PRN
  Filled 2023-11-10: qty 200, 40d supply, fill #0

## 2023-11-10 MED ORDER — NOVOLOG FLEXPEN 100 UNIT/ML ~~LOC~~ SOPN
PEN_INJECTOR | SUBCUTANEOUS | 0 refills | Status: DC
  Filled 2023-11-10: qty 15, 56d supply, fill #0

## 2023-11-10 MED ORDER — METOPROLOL TARTRATE 50 MG PO TABS
50.0000 mg | ORAL_TABLET | Freq: Two times a day (BID) | ORAL | 1 refills | Status: DC
  Filled 2023-11-10: qty 60, 30d supply, fill #0

## 2023-11-10 MED ORDER — PAROXETINE HCL 20 MG PO TABS
20.0000 mg | ORAL_TABLET | Freq: Every day | ORAL | 1 refills | Status: DC
  Filled 2023-11-10: qty 30, 30d supply, fill #0

## 2023-11-10 MED ORDER — ALBUTEROL SULFATE HFA 108 (90 BASE) MCG/ACT IN AERS
2.0000 | INHALATION_SPRAY | Freq: Four times a day (QID) | RESPIRATORY_TRACT | 0 refills | Status: DC | PRN
  Filled 2023-11-10: qty 18, 25d supply, fill #0

## 2023-11-10 NOTE — Progress Notes (Addendum)
 Antimicrobial Stewardship Pharmacy Note  Noted plans for the patient to discharge home and asked to review the patient's HIV medications and OI prophylaxis for home.   The patient has had a prolonged hospital course requiring intubation and a feeding tube. Adjustments to ART and OI prophylaxis were made in order to be able to give via tube. An additional adjustment was made along the way from Descovy  >> Truvada  as [facility] was considering the patient and would not take her on Descovy .   The patient is now on a dysphagia 2 diet and able to take whole pills on day of discharge 4/30.. Will plan to adjust Truvada  + Tivicay  back to Biktarvy . Script sent to [facility].  Will continue Atovaquone  for PJP prophylaxis until [facility] clinic follow-up.  The patient continues on Fluconazole  400 mg - plan was 10 weeks at this dose (start date around 2/27) so still within the 10 week window and would recommend continuing this dose until ID follow-up once discharged. It is unclear how compliant she was between 2/20 and re-admission 3/15 so might need to reset the clock from 3/15.   Next appointment scheduled at the [facility] clinic on 5/16 @ 1015 with Dr. Gillian Lacrosse  Thank you for allowing pharmacy to be a part of this patient's care.  Garland Junk, PharmD, BCPS, BCIDP Infectious Diseases Clinical Pharmacist 11/10/2023 9:50 AM   **Pharmacist phone directory can now be found on [email or web address] ([email or web address]).  Listed under [facility].

## 2023-11-10 NOTE — TOC Transition Note (Signed)
 Transition of Care [facility]) - Discharge Note   Patient Details  Name: Jeremiah Stevens MRN: [id number] Date of Birth: 12-09-1975  [facility] CM/SW Contact:  Eusebio High, RN Phone Number: 11/10/2023, 9:10 AM   Clinical Narrative:     Patient will DC to home today . Outpatient PT/OT and SLP have been referred to [facility] location. AVS has been updated. Patient will continue with Ensure bolus PRN. Patient will follow up as directed on AVS  Family to transport        Barriers to Discharge: English as a second language teacher, SNF Pending bed offer, Continued Medical Work up (new trach)   Patient Goals and CMS Choice Patient states their goals for this hospitalization and ongoing recovery are:: Have a better housing situation   Choice offered to / list presented to : NA      Discharge Placement                       Discharge Plan and Services Additional resources added to the After Visit Summary for   In-house Referral: Clinical Social Work   Post Acute Care Choice: Skilled Nursing Facility          DME Arranged: N/A DME Agency: NA                  Social Drivers of Health (SDOH) Interventions SDOH Screenings   Food Insecurity: No Food Insecurity (09/27/2023)  Housing: Low Risk  (09/27/2023)  Transportation Needs: Unmet Transportation Needs (09/27/2023)  Utilities: Not At Risk (09/27/2023)  Depression (PHQ2-9): Low Risk  (05/22/2020)  Tobacco Use: High Risk (10/11/2023)     Readmission Risk Interventions     No data to display

## 2023-11-10 NOTE — Discharge Summary (Signed)
 PATIENT DETAILS Name: Jeremiah Stevens Age: 48 y.o. Sex: adult Date of Birth: 12-29-1975 MRN: [id number]. Admitting Physician: No admitting provider for patient encounter. WUJ:[id number], Adah Acron, MD  Admit Date: 09/26/2023 Discharge date: 11/10/2023  Recommendations for Outpatient Follow-up:  Follow up with PCP in 1-2 weeks Please obtain CMP/CBC in one week Please ensure follow-up with neurology, infectious disease, interventional radiology  Admitted From:  Home  Disposition: Home   Discharge Condition: good  CODE STATUS:   Code Status: Full Code   Diet recommendation:  Diet Order             Diet - low sodium heart healthy           Diet Carb Modified           DIET DYS 2 Room service appropriate? Yes with Assist; Fluid consistency: Thin  Diet effective now                    Brief Summary: 48 y.o. adult who with medical of DM2, seizures, HIV AIDS untreated, cryptococcal disease, obesity, polysubstance abuse, prior CVA who was admitted on 3/16 with dysphagia/drooling- found to be hypoxic-further evaluation revealed multifocal PNA.  Patient was started on antibiotics and admitted to the hospitalist service-Hospital course complicated by acute CVA-despite ongoing dysphagia-patient was noncompliant with diet (threatening to leave AMA while stroke workup ongoing)-reportedly ordered a pizza and had an aspiration event-with worsening hypoxemia-requiring transfer to the ICU where he had a PEA arrest-requiring 9 minutes of ACLS prior to ROSC.  During intubation-copious amounts of food material/emesis was observed in the oropharynx.  Patient had seizure-like activity after intubation.  He was subsequently managed in the ICU-was difficult to wean off the ventilator-he self extubated but had to be reintubated-ultimately ENT performed tracheostomy on 3/31.  He was transferred back to [facility] service on 4/7.   Significant events: 3/16 admit 3/18 neuro consult, found to have acute  CVA 3/19 Ongoing aspiration despite obvious dysphagia, was planning to leave AMA but sister talked pt into staying. then had a massive aspiration event leading to worse hypoxia and PEA arrest. Txf to [facility] 3/20 PSV cumulative total of 5-6 hrs, following some commands/ purposefully 3/21 self extubated, was reintubated 3/23 Failed SBT due to WOB 3/24 Failed SBT due to apnea 3/25 Tolerated PS >4 hours 3/26: Intubated. 3/31: S/p tracheostomy with ENT. 4/4: trach collar starting 1200  4/5 cont on trach collar  4/7: Care transferred from [facility] to [facility] 4/8: Modified barium swallow.  Failed.  NPO. 4/19: PEG tube insertion by IR 4/24: Trach decannulated by [facility] 4/28: Diet advanced to dysphagia 2   Significant studies: 3/15>> CT head: No acute intracranial abnormality. 3/16>> CTA chest: No PE 3/19>> MRI brain: Acute right medullary infarct. 3/19>> CT angio head/neck: No LVO 3/19>> LDL: 95 3/19>> A1c: 10.8 3/19>> echo: EF 60-65%, grade 1 diastolic dysfunction. 3/20-3/21>> LTM EEG: No seizures. 4/12>> CT abdomen: Gastric anatomy is borderline for percutaneous gastrostomy tube placement. 4/17>> CTA chest: No PE. 4/17>> B/L lower extremity Doppler: No DVT.   Significant microbiology data: 3/16>> COVID/influenza/RSV PCR: Negative 3/16>> blood culture: No growth 3/17>> sputum culture: Normal respiratory flora 3/19>> tracheal aspirate: No organisms.   3/20>> sputum culture: Normal respiratory flora 3/27>> blood culture: No growth 4/01>> tracheal aspirate: Normal respiratory flora 4/02>> blood culture: No growth   Procedures: 4/18>> PEG tube by IR.   Consults: Neurology PCCM  Brief Hospital Course: Acute hypoxic respiratory failure secondary to multifocal pneumonia and subsequently an aspiration  event Much better-weaned off the vent-required trach on 3/31-but subsequently decannulated on 4/24 Currently on room air Has completed a course of antibiotics.     PEA arrest Secondary to  aspiration event/possible seizures Telemetry monitoring Echo stable No significant anoxic issues-follows commands-moving all 4 extremities-speech clear and fluent.  PT/OT recommending outpatient physical therapy.   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 Continue statin Please ensure follow-up with stroke clinic/neurology postdischarge.   Dysphagia Likely oral pharyngeal dysphagia in the setting of CVA. Failed MBS-PEG inserted by IR on 4/5 Patient gradually improved-diet has been advanced to dysphagia 2 diet which patient is easily tolerating.  Spoke with IR team-unfortunately PEG has to stay in place for at least 6 weeks-they will arrange for follow-up-and if patient is doing well-they will remove the PEG tube in the outpatient setting. Will be switched to as needed PEG tube feedings on discharge.  Previously was on only nocturnal feedings-discussed with speech therapy on 4/29-he seems to be consuming enough calories-so so should be okay to switch to as needed tube feedings.  AKI Likely hemodynamically mediated Resolved.   Chronic HFpEF Euvolemic   Chest pain Elevated D-dimer No further chest pain CTA chest/Doppler study negative for VTE. On SQ Lovenox  prophylactic dose during this hospitalization.   HTN BP stable Continue metoprolol    HIV/AIDS Continue antiretrovirals On Mepron  for PJP prophylaxis Previously noncompliant to medications. Discussed with ID MD-Dr. Levern Reader on 4/29-office will arrange for follow-up appointment.   History of cryptococcal antigenemia  On fluconazole .   Normocytic anemia Due to critical illness/chronic disease Hb stable-follow periodically.   DM-2 (A1c 10.8 on 3/19) CBGs stable Semglee  15 units twice daily+ 8 units of  NovoLog  4 times daily+SSI  GERD Pepcid    Anxiety/depression Stable Continue Paxil  and Seroquel .   Medical noncompliance Previously noncompliant to ART's Has been noncompliant with diet-had a significant aspiration event. Per prior notes-has pulled out feeding tube-has attempted to pull a tracheotomy-prior MD's has explained the risk of sudden aspiration/respiratory arrest/death. Thankfully for the past week or so-has been very compliant with medication/and other instructions.   Nutrition Status: Nutrition Problem: Increased nutrient needs Etiology: chronic illness Signs/Symptoms: estimated needs Interventions: Tube feeding, Prostat   Class 1 Obesity: Estimated body mass index is 34.15 kg/m as calculated from the following:   Height as of this encounter: 6' (1.829 m).   Weight as of this encounter: 114.2 kg.   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)   Aspiration pneumonia (HCC)   AIDS (acquired immune deficiency syndrome) (HCC)   Discharge Instructions:  Activity:  As tolerated with Full fall precautions use walker/cane & assistance as needed  Discharge Instructions     Ambulatory referral to Neurology   Complete by: As directed    An appointment is requested in  approximately: 4 weeks   Call MD for:  difficulty breathing, headache or visual disturbances   Complete by: As directed    Call MD for:  redness, tenderness,  or signs of infection (pain, swelling, redness, odor or green/yellow discharge around incision site)   Complete by: As directed    Call MD for:  severe uncontrolled pain   Complete by: As directed    Diet - low sodium heart healthy   Complete by: As directed    Diet Carb Modified   Complete by: As directed    Discharge instructions   Complete by: As directed    Follow with Primary MD  [name], MD in 1-2 weeks  Please follow-up with the stroke clinic-the office will call you with a follow-up appointment  Please follow-up with radiology clinic-their office will call you with a follow-up appointment  Please check your blood sugars before meals-please keep a record of these readings and take it to your next appointment with your primary care practitioner.  Please get a complete blood count and chemistry panel checked by your Primary MD at your next visit, and again as instructed by your Primary MD.  Get Medicines reviewed and adjusted: Please take all your medications with you for your next visit with your Primary MD  Laboratory/radiological data: Please request your Primary MD to go over all hospital tests and procedure/radiological results at the follow up, please ask your Primary MD to get all Hospital records sent to his/her office.  In some cases, they will be blood work, cultures and biopsy results pending at the time of your discharge. Please request that your primary care M.D. follows up on these results.  Also Note the following: If you experience worsening of your admission symptoms, develop shortness of breath, life threatening emergency, suicidal or homicidal thoughts you must seek medical attention immediately by calling 911 or calling your MD immediately  if symptoms less severe.  You must read complete instructions/literature along with all the possible adverse reactions/side effects for all the Medicines you take and that have been prescribed to you. Take any new  Medicines after you have completely understood and accpet all the possible adverse reactions/side effects.   Do not drive when taking Pain medications or sleeping medications (Benzodaizepines)  Do not take more than prescribed Pain, Sleep and Anxiety Medications. It is not advisable to combine anxiety,sleep and pain medications without talking with your primary care practitioner  Special Instructions: If you have smoked or chewed Tobacco  in the last 2 yrs please stop smoking, stop any regular Alcohol   and or any Recreational drug use.  Wear Seat belts while driving.  Please note: You were cared for by a hospitalist during your hospital stay. Once you are discharged, your primary care physician will handle any further medical issues. Please note that NO REFILLS for any discharge medications will be authorized once you are discharged, as it is imperative that you return to your primary care physician (or establish a relationship with a primary care physician if you do not have one) for your post hospital discharge needs so that they can reassess your need for medications and monitor your lab values.   Increase activity slowly   Complete by: As directed       Allergies as of 11/10/2023       Reactions   Penicillins Anaphylaxis   Peanut-containing Drug Products Other (See Comments)   WALNUTS - SORES ON TONGUE   Sustiva [efavirenz] Rash  Medication List     STOP taking these medications    azithromycin  600 MG tablet Commonly known as: ZITHROMAX    Biktarvy  50-200-25 MG Tabs tablet Generic drug: bictegravir-emtricitabine -tenofovir  AF   Combivent  Respimat 20-100 MCG/ACT Aers respimat Generic drug: Ipratropium-Albuterol    diltiazem  360 MG 24 hr capsule Commonly known as: Cardizem  CD   Magnesium  400 MG Tabs   sulfamethoxazole -trimethoprim  800-160 MG tablet Commonly known as: BACTRIM  DS       TAKE these medications    Accu-Chek Guide Test test strip Generic drug:  glucose blood Use as directed 3 (three) times daily to check blood sugar.   Accu-Chek Guide w/Device Kit Use as directed 3 (three) times daily.   Accu-Chek Softclix Lancets lancets Use as directed 3 (three) times daily to check blood sugar.   albuterol  108 (90 Base) MCG/ACT inhaler Commonly known as: VENTOLIN  HFA Inhale 2 puffs into the lungs every 6 (six) hours as needed for wheezing or shortness of breath.   aspirin  81 MG chewable tablet Chew 1 tablet (81 mg total) by mouth daily.   atorvastatin  20 MG tablet Commonly known as: LIPITOR Take 1 tablet (20 mg total) by mouth at bedtime.   atovaquone  750 MG/5ML suspension Commonly known as: MEPRON  Take 10 mLs (1,500 mg total) by mouth daily with lunch.   BD Pen Needle Nano U/F 32G X 4 MM Misc Generic drug: Insulin  Pen Needle Use as directed 3 (three) times daily. What changed: Another medication with the same name was added. Make sure you understand how and when to take each.   Insulin  Pen Needle 32G X 4 MM Misc 1 each by Does not apply route as needed. What changed: You were already taking a medication with the same name, and this prescription was added. Make sure you understand how and when to take each.   clopidogrel  75 MG tablet Commonly known as: PLAVIX  Take 1 tablet (75 mg total) by mouth daily.   dolutegravir  50 MG tablet Commonly known as: TIVICAY  Take 1 tablet (50 mg total) by mouth daily.   emtricitabine -tenofovir  200-300 MG tablet Commonly known as: TRUVADA  Take 1 tablet by mouth daily.   fluconazole  200 MG tablet Commonly known as: DIFLUCAN  Take 2 tablets (400 mg total) by mouth daily.   insulin  glargine 100 UNIT/ML Solostar Pen Commonly known as: LANTUS  Inject 15 Units into the skin 2 (two) times daily. What changed: how much to take   Lancet Device Misc 1 each by Does not apply route 3 (three) times daily. May dispense any manufacturer covered by patient's insurance.   metoprolol  tartrate 50 MG  tablet Commonly known as: LOPRESSOR  Take 1 tablet (50 mg total) by mouth 2 (two) times daily. What changed:  medication strength how much to take   NovoLOG  FlexPen 100 UNIT/ML FlexPen Generic drug: insulin  aspart 0-9 Units, Subcutaneous, 3 times daily with meals CBG < 70: Implement Hypoglycemia measures CBG 70 - 120: 0 units CBG 121 - 150: 1 unit CBG 151 - 200: 2 units CBG 201 - 250: 3 units CBG 251 - 300: 5 units CBG 301 - 350: 7 units CBG 351 - 400: 9 units CBG > 400: call MD   pantoprazole  40 MG tablet Commonly known as: Protonix  Take 1 tablet (40 mg total) by mouth daily.   PARoxetine  20 MG tablet Commonly known as: PAXIL  Take 1 tablet (20 mg total) by mouth daily.   polyethylene glycol powder 17 GM/SCOOP powder Commonly known as: GLYCOLAX /MIRALAX  Take 17 g by mouth daily  as needed for moderate constipation.   QUEtiapine  50 MG tablet Commonly known as: SEROQUEL  Take 1 tablet (50 mg total) by mouth at bedtime.        Follow-up Information     [facility]. Call.   Why: [facility] is a transportation service for [employer] to travel to their doctor appointments. Give them a call at least 3 days before your appointment at [phone]. Contact information: [phone]        Terre Ferri, MD Follow up on 11/26/2023.   Specialty: Infectious Diseases Why: appt at 10:15 am Contact information: [address] [employer] [address] [place] [address] [phone]         [facility] INTERVENTIONAL RADIOLOGY Follow up.   Specialty: Radiology Why: Office will call with date/time, If you dont hear from them,please give them a call Contact information: [address] [place]  [address] [phone]        [facility] Follow up.   Specialty: Neurology Why: Office will call with date/time, If you dont hear from them,please give them a call Contact information: [address] [place]  [address] [phone]               Allergies  Allergen Reactions   Penicillins Anaphylaxis   Peanut-Containing Drug Products Other (See Comments)    WALNUTS - SORES ON TONGUE   Sustiva [Efavirenz] Rash     Other Procedures/Studies: DG Swallowing Func-Speech Pathology Result Date: 11/01/2023 Table formatting from the original result was not included. Modified Barium Swallow Study Patient Details Name: Mikolaj Brideau MRN: [id number] Date of Birth: July 01, 1976 Today's Date: 11/01/2023 HPI/PMH: HPI: Horace Terraine Deiss is a 48 y.o. adult who identifies as male, admitted 09/26/23 with dysphagia x 3 days along with headaches and drooling. She was found to be hypoxic to 80s on room air. CTA showed multifocal PNA.  3/18 she had right facial droop, right sided weakness, dysarthria. MRI demonstrated acute right medullary infarct. Despite ongoing dysphagia (MBS showed moderately severe pharyngeal phase dysphagia and imaging showed unilateral bulging on right), pt insisted on eating. She reportedly ordered pizza overnight 3/18 and certainly aspirated, desaturated, ultimately suffered PEA arrest, 9 minutes ACLS, copious food material and emesis in oropharynx. Self extubated 3/21, reintubated and extubated 3/26, Trach on 3/31, ATC on 4/4. Clinical Impression: Pt demosntrates improvement in swallow function; now achieving a DIGEST severity score of 3, improved from 4 on prior exam. Pt now has increased instances of epiglottic deflection, hyoid excursion and PES opening, though duration of movement needs improvement. If swallowing small sips of thin liquids, utilizing super-supraglottic and mendelsohn strategies pt is able to propel small sips of thin liquids to esophagus with trace penetration/aspiration that pt preventatively ejects. Cues were given with video biofeedback to reinforce success with pt. Verbal cues included "take a small sip, hold it in your mouth, hold your  breath. Now swallow hard and long, then clear your throat and swallow again." Narcisse was quite successful with these maneuvers. Purees and soft solids were more difficult, given increased pharyngeal residue, but a liquid wash with similar cues helped to transit bolus. After further training with SLP, Odies will be ready to try a full liquid diet. There is risk of aspiration and noncompliance but overall Nishad has been tolerating some aspiration for the sake of therapy and achieving improvement, so we will continue efforts.  DIGEST Swallow Severity Rating*  Safety:             Efficiency:             Overall Pharyngeal Swallow Severity: 1: mild; 2: moderate; 3: severe; 4: profound *The Dynamic Imaging Grade of Swallowing Toxicity is standardized for the head and neck cancer population, however, demonstrates promising clinical applications across populations to standardize the clinical rating of pharyngeal swallow safety and severity. Factors that may increase risk of adverse event in presence of aspiration [name] & Jessy Morocco 2021): No data recorded Recommendations/Plan: Swallowing Evaluation Recommendations Swallowing Evaluation Recommendations Liquid Administration via: Cup; No straw Medication Administration: Via alternative means Supervision: Patient able to self-feed Swallowing strategies  : Slow rate; Small bites/sips; Follow solids with liquids; Multiple dry swallows after each bite/sip; effortful swallow; Clear throat intermittently; Hold breath during swallow, followed by a cough (super supraglottic swallow); Swallow hold (Mendelsohn maneuver) Oral care recommendations: Oral care BID (2x/day) Treatment Plan Treatment Plan Treatment recommendations: Therapy as outlined in treatment plan below Follow-up recommendations: Follow physicians's recommendations for discharge plan and follow up therapies Functional status assessment: Patient has had a recent decline in their functional status and demonstrates  the ability to make significant improvements in function in a reasonable and predictable amount of time. Treatment frequency: Min 3x/week Treatment duration: 2 weeks Interventions: Aspiration precaution training; Compensatory techniques; Patient/family education; Trials of upgraded texture/liquids Recommendations Recommendations for follow up therapy are one component of a multi-disciplinary discharge planning process, led by the attending physician.  Recommendations may be updated based on patient status, additional functional criteria and insurance authorization. Assessment: Orofacial Exam: No data recorded Anatomy: Anatomy: WFL Boluses Administered: Boluses Administered Boluses Administered: Thin liquids (Level 0); Mildly thick liquids (Level 2, nectar thick); Puree; Solid  Oral Impairment Domain: Oral Impairment Domain Lip Closure: No labial escape Tongue control during bolus hold: Posterior escape of less than half of bolus Bolus preparation/mastication: Timely and efficient chewing and mashing Bolus transport/lingual motion: Brisk tongue motion Oral residue: Trace residue lining oral structures Initiation of pharyngeal swallow : Pyriform sinuses  Pharyngeal Impairment Domain: Pharyngeal Impairment Domain Soft palate elevation: Escape to nasopharynx Laryngeal elevation: Partial superior movement of thyroid cartilage/partial approximation of arytenoids to epiglottic petiole Anterior hyoid excursion: Partial anterior movement Epiglottic movement: Partial inversion Laryngeal vestibule closure: Incomplete, narrow column air/contrast in laryngeal vestibule Pharyngeal stripping wave : Present - diminished Pharyngeal contraction (A/P view only): N/A Pharyngoesophageal segment opening: Partial distention/partial duration, partial obstruction of flow Tongue base retraction: Trace column of contrast or air between tongue base and PPW Pharyngeal residue: Collection of residue within or on pharyngeal structures Location of  pharyngeal residue: Diffuse (>3 areas)  Esophageal Impairment Domain: No data recorded Pill: No data recorded Penetration/Aspiration Scale Score: Penetration/Aspiration Scale Score 1.  Material does not enter airway: Solid; Puree 2.  Material enters airway, remains ABOVE vocal cords then ejected out: Mildly thick liquids (Level 2, nectar thick) 6.  Material enters airway, passes BELOW cords then ejected out: Thin liquids (Level 0) 8.  Material enters airway, passes BELOW cords without attempt by patient to eject out (silent aspiration) : Thin liquids (Level 0) Compensatory Strategies: Compensatory Strategies Compensatory strategies: Yes Straw: Ineffective Effortful swallow: Effective Effective Effortful Swallow: Thin liquid (Level 0); Puree; Solid Multiple swallows: Effective Effective Multiple Swallows: Thin liquid (Level 0); Puree; Solid Chin tuck: Ineffective Ineffective Chin Tuck: Puree Liquid wash: Effective Effective Liquid Wash: Thin liquid (Level 0) Super supraglottic swallow: Effective Effective Super Supraglottic Swallow: Thin liquid (Level 0) Mendelsohn :  Effective Effective Mendelsohn: Thin liquid (Level 0); Puree; Solid   General Information: Caregiver present: No  Diet Prior to this Study: NPO; G-tube   Temperature : Normal   Respiratory Status: WFL   Supplemental O2: None (Room air)   No data recorded Behavior/Cognition: Alert; Cooperative Self-Feeding Abilities: Able to self-feed Baseline vocal quality/speech: Other (comment) (wet) Volitional Cough: Able to elicit Volitional Swallow: Able to elicit No data recorded Goal Planning: Prognosis for improved oropharyngeal function: Good Barriers to Reach Goals: Severity of deficits No data recorded Patient/Family Stated Goal: to eat Consulted and agree with results and recommendations: Patient Pain: No data recorded End of Session: Start Time:SLP Start Time (ACUTE ONLY): 1230 Stop Time: SLP Stop Time (ACUTE ONLY): 1300 Time Calculation:SLP Time Calculation  (min) (ACUTE ONLY): 30 min Charges: SLP Evaluations $ SLP Speech Visit: 1 Visit SLP Evaluations $MBS Swallow: 1 Procedure $Swallowing Treatment: 1 Procedure SLP visit diagnosis: SLP Visit Diagnosis: Dysphagia, pharyngeal phase (R13.13) Past Medical History: Past Medical History: Diagnosis Date  Cancer (HCC)   seizures  Diabetes (HCC)   Diabetes mellitus without complication (HCC)   Heartburn   occasional; OTC as needed  Herpes genitalis in men   HIV (human immunodeficiency virus infection) (HCC)   HIV disease (HCC)   HTN (hypertension)   Hyperlipidemia   Hypertension   under control with med., has been on med. x 1 yr.  Lateral malleolar fracture 09/02/2013  left  Migraines   Tear of deltoid ligament of left ankle 09/02/2013  Type 2 diabetes mellitus with hyperosmolar nonketotic hyperglycemia (HCC) 02/10/2020 Past Surgical History: Past Surgical History: Procedure Laterality Date  IR GASTROSTOMY TUBE MOD SED  10/29/2023  NO PAST SURGERIES    ORIF ANKLE FRACTURE Left 09/13/2013  Procedure: OPEN REDUCTION INTERNAL FIXATION (ORIF) LEFT LATERAL MALLEOLUS ANKLE FRACTURE ;  Surgeon: Christie Cox, MD;  Location: [facility];  Service: Orthopedics;  Laterality: Left;  TRACHEOSTOMY TUBE PLACEMENT N/A 10/11/2023  Procedure: CREATION, TRACHEOSTOMY;  Surgeon: Janita Mellow, MD;  Location: [facility] OR;  Service: ENT;  Laterality: N/A; DeBlois, Hardin Leys 11/01/2023, 1:27 PM  NM Pulmonary Perfusion Addendum Date: 11/01/2023 ADDENDUM REPORT: 11/01/2023 12:43 ADDENDUM: Correction: There is an error in the technique section. Technique section should read as follows: Perfusion images were obtained in multiple projections after intravenous injection of radiopharmaceutical. No VENTILATION imaging was performed. Electronically Signed   By: Elmon Hagedorn M.D.   On: 11/01/2023 12:43   Result Date: 11/01/2023 CLINICAL DATA:  Pulmonary embolism suspected, high probability. Chest pain, shortness of breath and difficulty  swallowing. EXAM: NUCLEAR MEDICINE PERFUSION LUNG SCAN TECHNIQUE: Perfusion images were obtained in multiple projections after intravenous injection of radiopharmaceutical. No perfusion imaging performed. RADIOPHARMACEUTICALS:  4.2 mCi Tc-44m MAA IV COMPARISON:  Chest radiographs 10/27/2023 and 10/26/2023. Abdominal CT 10/23/2023. FINDINGS: Relatively decreased perfusion to the left lung base without clear corresponding correlate on recent radiographs. There is mild atelectasis at both lung bases on prior abdominal CT. There are no wedge-shaped perfusion defects in either lung to strongly suggest acute pulmonary embolism. IMPRESSION: Decreased perfusion to the left lung base, indeterminate in etiology on this ventilation-only study. Further evaluation recommended. If patient unable to undergo pulmonary chest CTA with contrast, two-view chest radiographs and bilateral lower extremity venous doppler ultrasound recommended. Electronically Signed: By: Elmon Hagedorn M.D. On: 10/28/2023 16:21   IR GASTROSTOMY TUBE MOD SED Result Date: 10/29/2023 CLINICAL DATA:  Dysphagia, needs enteral feeding support EXAM: PERC PLACEMENT GASTROSTOMY FLUOROSCOPY: Radiation Exposure Index (as provided by  the fluoroscopic device): 46.2 mGy air Kerma TECHNIQUE: The procedure, risks, benefits, and alternatives were explained to the patient. Questions regarding the procedure were encouraged and answered. The patient understands and consents to the procedure. As antibiotic prophylaxis, vancomycin  1.5 g was ordered pre-procedure and administered intravenously within one hour of incision. Safe percutaneous window confirmed on recent CT. A 5 French angiographic catheter was placed as orogastric tube. The upper abdomen was prepped with Betadine, draped in usual sterile fashion, and infiltrated locally with 1% lidocaine . Intravenous Fentanyl  25mcg and Versed  2mg  were administered by RN during a total moderate (conscious) sedation time of 10  minutes; the patient's level of consciousness and physiological / cardiorespiratory status were monitored continuously by radiology RN under my direct supervision. 0.5 mg glucagon  were given IV. Stomach was insufflated using air through the orogastric tube. An 2 French sheath needle was advanced percutaneously into the gastric lumen under fluoroscopy. Gas could be aspirated and a small contrast injection confirmed intraluminal spread. The sheath was exchanged over a guidewire for a 9 [place] vascular sheath, through which the snare device was advanced and used to snare a guidewire passed through the orogastric tube. This was withdrawn, and the snare attached to the 20 French pull-through gastrostomy tube, which was advanced antegrade, positioned with the internal bumper securing the anterior gastric wall to the anterior abdominal wall. Small contrast injection confirms appropriate positioning. The external bumper was applied and the catheter was flushed. COMPLICATIONS: COMPLICATIONS none IMPRESSION: 1. Technically successful 20 French pull-through gastrostomy placement under fluoroscopy. Electronically Signed   By: Nicoletta Barrier M.D.   On: 10/29/2023 10:24   VAS US  LOWER EXTREMITY VENOUS (DVT) Result Date: 10/29/2023  Lower Venous DVT Study Patient Name:  Zebulan TERRAINE Renn  Date of Exam:   10/28/2023 Medical Rec #: [id number]            Accession #:    [id number] Date of Birth: 18-Apr-1976           Patient Gender: M Patient Age:   56 years Exam Location:  [facility] Procedure:      VAS US  LOWER EXTREMITY VENOUS (DVT) Referring Phys: DAWOOD ELGERGAWY --------------------------------------------------------------------------------  Indications: Elevated D-dimer (3.08).  Limitations: Poor ultrasound/tissue interface. Comparison Study: Previous exam on 10/04/2020 was negative for DVT Performing Technologist: Arlyce Berger RVT, RDMS  Examination Guidelines: A complete evaluation includes B-mode imaging, spectral  Doppler, color Doppler, and power Doppler as needed of all accessible portions of each vessel. Bilateral testing is considered an integral part of a complete examination. Limited examinations for reoccurring indications may be performed as noted. The reflux portion of the exam is performed with the patient in reverse Trendelenburg.  +---------+---------------+---------+-----------+----------+--------------+ RIGHT    CompressibilityPhasicitySpontaneityPropertiesThrombus Aging +---------+---------------+---------+-----------+----------+--------------+ CFV      Full           Yes      Yes                                 +---------+---------------+---------+-----------+----------+--------------+ SFJ      Full                                                        +---------+---------------+---------+-----------+----------+--------------+ FV Prox  Full  Yes      Yes                                 +---------+---------------+---------+-----------+----------+--------------+ FV Mid   Full           Yes      Yes                                 +---------+---------------+---------+-----------+----------+--------------+ FV DistalFull           Yes      Yes                                 +---------+---------------+---------+-----------+----------+--------------+ PFV      Full                                                        +---------+---------------+---------+-----------+----------+--------------+ POP      Full           Yes      Yes                                 +---------+---------------+---------+-----------+----------+--------------+ PTV      Full                                                        +---------+---------------+---------+-----------+----------+--------------+ PERO     Full                                                        +---------+---------------+---------+-----------+----------+--------------+    +---------+---------------+---------+-----------+----------+--------------+ LEFT     CompressibilityPhasicitySpontaneityPropertiesThrombus Aging +---------+---------------+---------+-----------+----------+--------------+ CFV      Full           Yes      Yes                                 +---------+---------------+---------+-----------+----------+--------------+ SFJ      Full                                                        +---------+---------------+---------+-----------+----------+--------------+ FV Prox  Full           Yes      Yes                                 +---------+---------------+---------+-----------+----------+--------------+ FV Mid   Full           Yes      Yes                                 +---------+---------------+---------+-----------+----------+--------------+  FV DistalFull           Yes      Yes                                 +---------+---------------+---------+-----------+----------+--------------+ PFV      Full                                                        +---------+---------------+---------+-----------+----------+--------------+ POP      Full           Yes      Yes                                 +---------+---------------+---------+-----------+----------+--------------+ PTV      Full                                                        +---------+---------------+---------+-----------+----------+--------------+ PERO     Full                                                        +---------+---------------+---------+-----------+----------+--------------+     Summary: BILATERAL: - No evidence of deep vein thrombosis seen in the lower extremities, bilaterally. -No evidence of popliteal cyst, bilaterally.   *See table(s) above for measurements and observations. Electronically signed by Delaney Fearing on 10/29/2023 at 9:07:26 AM.    Final    DG Abd Portable 1V Result Date: 10/29/2023 CLINICAL DATA:   [id number].  Preoperative evaluation. EXAM: PORTABLE ABDOMEN - 1 VIEW COMPARISON:  Abdomen CT without contrast 10/23/2023. FINDINGS: The bowel gas pattern is nonobstructive. No radio-opaque calculi or other significant radiographic abnormality are seen. There is contrast in the pelvis is probably in the bladder although could be within a distended rectum. There is a Dobbhoff feeding tube with the tip distal stomach. There is no supine evidence of free air. Lung bases are clear.  Mild thoracic spondylosis. IMPRESSION: 1. Nonobstructive bowel gas pattern. 2. Dobbhoff feeding tube with the tip in the distal stomach. 3. Contrast in the pelvis is probably in the bladder although could be within a distended rectum. Electronically Signed   By: Denman Fischer M.D.   On: 10/29/2023 05:42   CT Angio Chest Pulmonary Embolism (PE) W or WO Contrast Result Date: 10/29/2023 CLINICAL DATA:  Elevated D-dimer and abnormal V/Q scan initial encounter EXAM: CT ANGIOGRAPHY CHEST WITH CONTRAST TECHNIQUE: Multidetector CT imaging of the chest was performed using the standard protocol during bolus administration of intravenous contrast. Multiplanar CT image reconstructions and MIPs were obtained to evaluate the vascular anatomy. RADIATION DOSE REDUCTION: This exam was performed according to the departmental dose-optimization program which includes automated exposure control, adjustment of the mA and/or kV according to patient size and/or use of iterative reconstruction technique. CONTRAST:  75mL OMNIPAQUE  IOHEXOL  350 MG/ML SOLN COMPARISON:  Chest x-ray from 10/27/2023, nuclear exam  from the previous day. FINDINGS: Cardiovascular: Thoracic aorta shows a normal branching pattern. No aneurysmal dilatation or dissection is noted. Heart is mildly enlarged in size. The pulmonary artery shows a normal branching pattern bilaterally. No findings to suggest pulmonary embolism are noted. Mediastinum/Nodes: Gastric catheter extends into the stomach.  Endotracheal tube is noted in satisfactory position. The thoracic inlet is otherwise within normal limits. No hilar or mediastinal adenopathy is noted. Lungs/Pleura: Lungs are well aerated bilaterally. Bibasilar atelectatic changes are seen. No effusion or parenchymal nodule is noted. No evidence of pneumothorax is seen. Upper Abdomen: Within normal limits. Musculoskeletal: Multiple old rib fractures are noted anteriorly on the left with healing to include the second through sixth ribs. Review of the MIP images confirms the above findings. IMPRESSION: No evidence of pulmonary embolism. Bibasilar atelectasis. Multiple healing rib fractures on the left as described. Electronically Signed   By: Violeta Grey M.D.   On: 10/29/2023 01:03   DG Chest Port 1 View Result Date: 10/27/2023 CLINICAL DATA:  Chest pain EXAM: PORTABLE CHEST 1 VIEW COMPARISON:  10/26/2023 FINDINGS: Tracheostomy is unchanged. Feeding tube in place, seen entering the stomach. The tip is not visualized on this chest x-ray. Heart and mediastinal contours are within normal limits. No focal opacities or effusions. No acute bony abnormality. IMPRESSION: No active cardiopulmonary disease. Electronically Signed   By: Janeece Mechanic M.D.   On: 10/27/2023 22:12   DG Chest Port 1 View Result Date: 10/26/2023 CLINICAL DATA:  Shortness of breath. EXAM: PORTABLE CHEST 1 VIEW COMPARISON:  10/23/2023 FINDINGS: Tracheostomy tube again noted. Low lung volumes without focal consolidation or airspace pulmonary edema. No pleural effusion. Cardiopericardial silhouette is at upper limits of normal for size. A feeding tube passes into the stomach although the distal tip position is not included on the film. Telemetry leads overlie the chest. IMPRESSION: Low lung volumes without acute cardiopulmonary findings. Electronically Signed   By: Donnal Fusi M.D.   On: 10/26/2023 06:36   CT ABDOMEN WO CONTRAST Result Date: 10/24/2023 CLINICAL DATA:  Cerebral infarction and  need for percutaneous gastrostomy tube for long-term nutrition. Assessment abdominal and gastric anatomy for potential percutaneous gastrostomy tube placement. EXAM: CT ABDOMEN WITHOUT CONTRAST TECHNIQUE: Multidetector CT imaging of the abdomen was performed following the standard protocol without IV contrast. RADIATION DOSE REDUCTION: This exam was performed according to the departmental dose-optimization program which includes automated exposure control, adjustment of the mA and/or kV according to patient size and/or use of iterative reconstruction technique. COMPARISON:  CTA chest 09/27/2023.  CT abdomen 08/02/2022. FINDINGS: Lower chest: Bibasilar atelectasis, right greater than left. Hepatobiliary: Hepatic steatosis. Normal and contracted gallbladder. No biliary ductal dilatation. Pancreas: Unremarkable. No pancreatic ductal dilatation or surrounding inflammatory changes. Spleen: Normal in size without focal abnormality. Adrenals/Urinary Tract: Normal adrenal glands and kidneys. No evidence of hydronephrosis or renal calculi. Stomach/Bowel: A feeding tube extends through the stomach and terminates in the second portion of the duodenum. No evidence of bowel obstruction, ileus or free intraperitoneal air. Gastric anatomy and positioning is somewhat borderline for percutaneous gastrostomy tube placement based on current CT but based on prior studies, insufflation of the stomach with air should provide a window between the left lobe of the liver and splenic flexure for save gastrostomy tube placement. This can be assessed at the time the procedure. Vascular/Lymphatic: No significant vascular findings are present. No enlarged abdominal or pelvic lymph nodes. Other: No abdominal wall hernia or abnormality. Musculoskeletal: No acute or significant osseous findings. IMPRESSION:  1. Gastric anatomy and positioning is somewhat borderline for percutaneous gastrostomy tube placement based on current CT but based on prior  studies, insufflation of the stomach with air should provide a window between the left lobe of the liver and splenic flexure for gastrostomy tube placement. This can be assessed at the time the procedure. Barium can also be given the night before via the feeding tube in order to opacify the colon at the time possible gastrostomy tube placement. 2. Hepatic steatosis. 3. Bibasilar atelectasis, right greater than left. Electronically Signed   By: Erica Hau M.D.   On: 10/24/2023 11:00   DG Chest Port 1 View Result Date: 10/23/2023 CLINICAL DATA:  Chest pain EXAM: PORTABLE CHEST 1 VIEW COMPARISON:  Four days prior FINDINGS: A tracheostomy tube is well seated. The feeding tube at least reaches the diaphragm. Cardiomegaly. Normal mediastinal contours allowing for rotation. Indistinct density at the bases. No edema, effusion, or pneumothorax. IMPRESSION: Stable low volume chest. Electronically Signed   By: Ronnette Coke M.D.   On: 10/23/2023 07:00   DG Swallowing Func-Speech Pathology Result Date: 10/19/2023 Table formatting from the original result was not included. Modified Barium Swallow Study Study completed by Aurelia Leeks, SLP Student Supervised and reviewed by Noble Bateman MA CCC-SLP Patient Details Name: Ersel Kleeman MRN: [id number] Date of Birth: 09-Jul-1976 Today's Date: 10/19/2023 HPI/PMH: HPI: Gannen Sikes is a 48 y.o. adult who identifies as male, admitted 09/26/23 with dysphagia x 3 days along with headaches and drooling. She was found to be hypoxic to 80s on room air. CTA showed multifocal PNA.  3/18 she had right facial droop, right sided weakness, dysarthria. MRI demonstrated acute right medullary infarct. Despite ongoing dysphagia (MBS showed moderately severe pharyngeal phase dysphagia and imaging showed unilateral bulging on right), pt insisted on eating. She reportedly ordered pizza overnight 3/18 and certainly aspirated, desaturated, ultimately suffered PEA arrest, 9 minutes  ACLS, copious food material and emesis in oropharynx. Self extubated 3/21, reintubated and extubated 3/26, Trach on 3/31, ATC on 4/4. Clinical Impression: Clinical Impression: Pt presents with a severe oropharyngeal dysphagia (DIGEST Score: 4) primarily characterized by chronic and gross silent aspiration of all consistencies tested due to minimal to no pharyngeal clearance of bolus trials.      Multiple hard cough and throat clears and suctioning were required for clearing bolus samples out of pharynx. Oral transit was mostly brisk with trace residue on tongue. Swallow inititation was primarily at level of pyriforms. Silent aspiration events were a result of minimal transit of bolus through the pharynx and UES. Pharyngeal stripping wave was not present and hyolaryngeal excursion was minimal. Epiglottic inversion was not present, with only minimal movement observed. Passage through UES was very minimal for majority of consistencies but not present during puree. All these components resulted in minimal to no pharyngeal clearance, leading to gross aspiration of consistencies with no immediate cough response from pt. Pt required verbal cueing to perform hard coughs and throat clears; Multiple hard cough and throat clears and suctioning were required for clearing bolus samples out of pharynx. Coughing and throat clearing appeared to not be effective for clearing deep aspiration but only for more penetrated material near laryngeal vestibule opening. Pt required lots of suctioning for puree trial due to no pharygneal clearance of bolus. Right and left head turns, chin tuck, and effortful swallow were utilized but were not effective for improving swallowing efficiency or safety.     Recommend continued NPO status due to severe  risk for aspirating large volumes of any bolus consistency. Pt can have ice chips but only after oral care; pt must use a slow rate and small bites to optimize airway safety. SLP will f/u to monitor  pt's tolerance of ice chips and provide exercises for rehabilitating pharyngeal swallow. Factors that may increase risk of adverse event in presence of aspiration [name] & Jessy Morocco 2021): Factors that may increase risk of adverse event in presence of aspiration [name] & Jessy Morocco 2021): Presence of tubes (ETT, trach, NG, etc.); Frequent aspiration of large volumes; Poor general health and/or compromised immunity Recommendations/Plan: Swallowing Evaluation Recommendations Swallowing Evaluation Recommendations Recommendations: NPO; Ice chips PRN after oral care Medication Administration: Via alternative means Swallowing strategies  : Slow rate; Small bites/sips Postural changes: Position pt fully upright for meals; Stay upright 30-60 min after meals Oral care recommendations: Oral care BID (2x/day) Caregiver Recommendations: Have oral suction available Treatment Plan Treatment Plan Treatment recommendations: Therapy as outlined in treatment plan below Follow-up recommendations: Follow physicians's recommendations for discharge plan and follow up therapies Treatment frequency: Min 2x/week Treatment duration: 2 weeks Recommendations Recommendations for follow up therapy are one component of a multi-disciplinary discharge planning process, led by the attending physician.  Recommendations may be updated based on patient status, additional functional criteria and insurance authorization. Assessment: Orofacial Exam: No data recorded Anatomy: Anatomy: WFL (Trach) Boluses Administered: Boluses Administered Boluses Administered: Thin liquids (Level 0); Mildly thick liquids (Level 2, nectar thick); Moderately thick liquids (Level 3, honey thick)  Oral Impairment Domain: Oral Impairment Domain Lip Closure: No labial escape Tongue control during bolus hold: Posterior escape of less than half of bolus Bolus preparation/mastication: -- (Not tested.) Bolus transport/lingual motion: Brisk tongue motion Oral residue: Trace residue  lining oral structures Location of oral residue : Tongue Initiation of pharyngeal swallow : Pyriform sinuses  Pharyngeal Impairment Domain: Pharyngeal Impairment Domain Soft palate elevation: Trace column of contrast or air between SP and PW Laryngeal elevation: Minimal superior movement of thyroid cartilage with minimal approximation of arytenoids to epiglottic petiole Anterior hyoid excursion: Partial anterior movement Epiglottic movement: No inversion Laryngeal vestibule closure: None, wide column air/contrast in laryngeal vestibule Pharyngeal stripping wave : Absent Pharyngeal contraction (A/P view only): N/A Pharyngoesophageal segment opening: No distension with total obstruction of flow Tongue base retraction: Narrow column of contrast or air between tongue base and PPW Pharyngeal residue: Minimal to no pharyngeal clearance Location of pharyngeal residue: Diffuse (>3 areas)  Esophageal Impairment Domain: Esophageal Impairment Domain Esophageal clearance upright position: -- (Not tested.) Pill: No data recorded Penetration/Aspiration Scale Score: Penetration/Aspiration Scale Score 8.  Material enters airway, passes BELOW cords without attempt by patient to eject out (silent aspiration) : Thin liquids (Level 0); Mildly thick liquids (Level 2, nectar thick); Moderately thick liquids (Level 3, honey thick); Puree Compensatory Strategies: Compensatory Strategies Compensatory strategies: Yes Effortful swallow: Ineffective Ineffective Effortful Swallow: Puree Chin tuck: Ineffective Ineffective Chin Tuck: Puree Left head turn: Ineffective Ineffective Left Head Turn: Moderately thick liquid (Level 3, honey thick) Right head turn: Ineffective Ineffective Right Head Turn: Mildly thick liquid (Level 2, nectar thick)   General Information: Caregiver present: No  Diet Prior to this Study: NPO; Cortrak/Small bore NG tube   Temperature : Normal   Respiratory Status: WFL   Supplemental O2: Trach Collar   History of Recent  Intubation: Yes  Behavior/Cognition: Alert; Cooperative Self-Feeding Abilities: Able to self-feed Baseline vocal quality/speech: Aphonic Volitional Cough: Able to elicit Volitional Swallow: Able to elicit Exam Limitations: No limitations  Goal Planning: Prognosis for improved oropharyngeal function: Guarded Barriers to Reach Goals: Severity of deficits No data recorded No data recorded Consulted and agree with results and recommendations: Patient Pain: Pain Assessment Pain Assessment: No/denies pain End of Session: Start Time:SLP Start Time (ACUTE ONLY): 1335 Stop Time: SLP Stop Time (ACUTE ONLY): 1359 Time Calculation:SLP Time Calculation (min) (ACUTE ONLY): 24 min Charges: No data recorded SLP visit diagnosis: SLP Visit Diagnosis: Dysphagia, oropharyngeal phase (R13.12) Past Medical History: Past Medical History: Diagnosis Date  Cancer (HCC)   seizures  Diabetes (HCC)   Diabetes mellitus without complication (HCC)   Heartburn   occasional; OTC as needed  Herpes genitalis in men   HIV (human immunodeficiency virus infection) (HCC)   HIV disease (HCC)   HTN (hypertension)   Hyperlipidemia   Hypertension   under control with med., has been on med. x 1 yr.  Lateral malleolar fracture 09/02/2013  left  Migraines   Tear of deltoid ligament of left ankle 09/02/2013  Type 2 diabetes mellitus with hyperosmolar nonketotic hyperglycemia (HCC) 02/10/2020 Past Surgical History: Past Surgical History: Procedure Laterality Date  NO PAST SURGERIES    ORIF ANKLE FRACTURE Left 09/13/2013  Procedure: OPEN REDUCTION INTERNAL FIXATION (ORIF) LEFT LATERAL MALLEOLUS ANKLE FRACTURE ;  Surgeon: Christie Cox, MD;  Location: [facility];  Service: Orthopedics;  Laterality: Left;  TRACHEOSTOMY TUBE PLACEMENT N/A 10/11/2023  Procedure: CREATION, TRACHEOSTOMY;  Surgeon: Janita Mellow, MD;  Location: [facility] OR;  Service: ENT;  Laterality: N/A; [place], Hardin Leys 10/19/2023, 3:01 PM  DG Chest Port 1 View Result Date:  10/19/2023 CLINICAL DATA:  Shortness of breath EXAM: PORTABLE CHEST 1 VIEW COMPARISON:  Six days ago FINDINGS: Tracheostomy tube which is well seated. An enteric tube at least reaches the diaphragm. Stable borderline heart size. Accentuated vessels in the setting of low lung volumes. Mild streaky density at the lung bases. No effusion or pneumothorax. IMPRESSION: Low volume chest with mild atelectatic type change at the bases. Electronically Signed   By: Ronnette Coke M.D.   On: 10/19/2023 07:52   DG CHEST PORT 1 VIEW Result Date: 10/13/2023 CLINICAL DATA:  Respiratory failure, fever. EXAM: PORTABLE CHEST 1 VIEW COMPARISON:  October 01, 2023. FINDINGS: Stable cardiomediastinal silhouette. Tracheostomy and feeding tubes are in grossly good position. Both lungs are clear. The visualized skeletal structures are unremarkable. IMPRESSION: No active disease. Electronically Signed   By: Rosalene Colon M.D.   On: 10/13/2023 11:59     TODAY-DAY OF DISCHARGE:  Subjective:   Marcey Server today has no headache,no chest abdominal pain,no new weakness tingling or numbness, feels much better wants to go home today.   Objective:   Blood pressure 118/85, pulse 92, temperature 98 F (36.7 C), temperature source Oral, resp. rate (!) 21, height 6' (1.829 m), weight 114.3 kg, SpO2 98%. No intake or output data in the 24 hours ending 11/10/23 0848 Filed Weights   11/03/23 0409 11/09/23 0339 11/10/23 0500  Weight: 112.4 kg 114.2 kg 114.3 kg    Exam: Awake Alert, Oriented *3, No new F.N deficits, Normal affect [place].AT,PERRAL Supple Neck,No JVD, No cervical lymphadenopathy appriciated.  Symmetrical Chest wall movement, Good air movement bilaterally, CTAB RRR,No Gallops,Rubs or new Murmurs, No Parasternal Heave +ve B.Sounds, Abd Soft, Non tender, No organomegaly appriciated, No rebound -guarding or rigidity. No Cyanosis, Clubbing or edema, No new Rash or bruise   PERTINENT RADIOLOGIC STUDIES: No results  found.   PERTINENT LAB RESULTS: CBC: No results for input(s): "WBC", "  HGB", "HCT", "PLT" in the last 72 hours. CMET CMP     Component Value Date/Time   NA 136 11/07/2023 0534   K 4.0 11/07/2023 0534   CL 102 11/07/2023 0534   CO2 27 11/07/2023 0534   GLUCOSE 135 (H) 11/07/2023 0534   BUN 16 11/07/2023 0534   CREATININE 1.15 11/07/2023 0534   CREATININE 1.03 05/22/2020 1128   CALCIUM  9.3 11/07/2023 0534   PROT 7.1 10/16/2023 1054   ALBUMIN  2.2 (L) 10/16/2023 1054   AST 19 10/16/2023 1054   ALT 29 10/16/2023 1054   ALKPHOS 78 10/16/2023 1054   BILITOT 0.4 10/16/2023 1054   GFRNONAA >60 11/07/2023 0534   GFRNONAA 89 05/22/2020 1128    GFR Estimated Creatinine Clearance (by C-G formula based on SCr of 1.15 mg/dL) Male: 82.9 mL/min Male: 103.7 mL/min No results for input(s): "LIPASE", "AMYLASE" in the last 72 hours. No results for input(s): "CKTOTAL", "CKMB", "CKMBINDEX", "TROPONINI" in the last 72 hours. Invalid input(s): "POCBNP" No results for input(s): "DDIMER" in the last 72 hours. No results for input(s): "HGBA1C" in the last 72 hours. No results for input(s): "CHOL", "HDL", "LDLCALC", "TRIG", "CHOLHDL", "LDLDIRECT" in the last 72 hours. No results for input(s): "TSH", "T4TOTAL", "T3FREE", "THYROIDAB" in the last 72 hours.  Invalid input(s): "FREET3" No results for input(s): "VITAMINB12", "FOLATE", "FERRITIN", "TIBC", "IRON", "RETICCTPCT" in the last 72 hours. Coags: No results for input(s): "INR" in the last 72 hours.  Invalid input(s): "PT" Microbiology: No results found for this or any previous visit (from the past 240 hours).  FURTHER DISCHARGE INSTRUCTIONS:  Get Medicines reviewed and adjusted: Please take all your medications with you for your next visit with your Primary MD  Laboratory/radiological data: Please request your Primary MD to go over all hospital tests and procedure/radiological results at the follow up, please ask your Primary MD to get  all Hospital records sent to his/her office.  In some cases, they will be blood work, cultures and biopsy results pending at the time of your discharge. Please request that your primary care M.D. goes through all the records of your hospital data and follows up on these results.  Also Note the following: If you experience worsening of your admission symptoms, develop shortness of breath, life threatening emergency, suicidal or homicidal thoughts you must seek medical attention immediately by calling 911 or calling your MD immediately  if symptoms less severe.  You must read complete instructions/literature along with all the possible adverse reactions/side effects for all the Medicines you take and that have been prescribed to you. Take any new Medicines after you have completely understood and accpet all the possible adverse reactions/side effects.   Do not drive when taking Pain medications or sleeping medications (Benzodaizepines)  Do not take more than prescribed Pain, Sleep and Anxiety Medications. It is not advisable to combine anxiety,sleep and pain medications without talking with your primary care practitioner  Special Instructions: If you have smoked or chewed Tobacco  in the last 2 yrs please stop smoking, stop any regular Alcohol   and or any Recreational drug use.  Wear Seat belts while driving.  Please note: You were cared for by a hospitalist during your hospital stay. Once you are discharged, your primary care physician will handle any further medical issues. Please note that NO REFILLS for any discharge medications will be authorized once you are discharged, as it is imperative that you return to your primary care physician (or establish a relationship with a primary care physician  if you do not have one) for your post hospital discharge needs so that they can reassess your need for medications and monitor your lab values.  Total Time spent coordinating discharge including  counseling, education and face to face time equals greater than 30 minutes.  SignedKimberly Penna 11/10/2023 8:48 AM

## 2023-11-10 NOTE — Plan of Care (Signed)
  Problem: Clinical Measurements: Goal: Ability to maintain clinical measurements within normal limits will improve Outcome: Progressing Goal: Will remain free from infection Outcome: Progressing   Problem: Coping: Goal: Level of anxiety will decrease Outcome: Progressing   Problem: Skin Integrity: Goal: Risk for impaired skin integrity will decrease Outcome: Progressing

## 2023-11-10 NOTE — Progress Notes (Signed)
 [facility]    Date of Admission:  09/26/2023      ID: Jeremiah Stevens is a 48 y.o. adult with  advanced poorly controlled HIV disease, T2DM, acute CVA, cryptococcal antigenemia, aspiration pneumonia with respiratory distress/PEA s/p tracheostomy, s/p peg. Then trach decannulated on 4/24.  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 Looking forward to going home  Medications:   aspirin   81 mg Oral Daily   atorvastatin   20 mg Oral QHS   atovaquone   1,500 mg Oral Q lunch   Chlorhexidine  Gluconate Cloth  6 each Topical Daily   clopidogrel   75 mg Oral Daily   dolutegravir   50 mg Oral Daily   emtricitabine -tenofovir   1 tablet Oral Daily   enoxaparin  (LOVENOX ) injection  50 mg Subcutaneous Q24H   famotidine   20 mg Oral BID   feeding supplement  237 mL Oral TID BM   feeding supplement (PROSource TF20)  60 mL Per Tube Daily   fluconazole   400 mg Oral Daily   free water   100 mL Per Tube Q4H   Gerhardt's butt cream   Topical BID   insulin  aspart  0-20 Units Subcutaneous Q4H   insulin  aspart  8 Units Subcutaneous 4 times per day   insulin  glargine-yfgn  15 Units Subcutaneous BID   metoprolol  tartrate  50 mg Oral BID   multivitamin with minerals  1 tablet Oral Daily   nicotine   14 mg Transdermal Daily    pantoprazole  (PROTONIX ) IV  40 mg Intravenous QHS   PARoxetine   20 mg Oral Daily   QUEtiapine   50 mg Oral QHS    Objective: Vital signs in last 24 hours: Temp:  [97.5 F (36.4 C)-98.9 F (37.2 C)] 98 F (36.7 C) (04/30 0354) Pulse Rate:  [88-102] 100 (04/30 0928) Resp:  [21-22] 21 (04/30 0354) BP: (108-139)/(67-93) 120/90 (04/30 0928) SpO2:  [96 %-98 %] 98 % (04/30 0354) Weight:  [114.3 kg] 114.3 kg (04/30 0500)  Physical Exam  Constitutional:  oriented to person, place, and time. appears well-developed and well-nourished. No distress.  HENT: [place]/AT, PERRLA, no scleral icterus Mouth/Throat: Oropharynx is clear and moist. No oropharyngeal exudate.  Cardiovascular: Normal rate, regular rhythm and normal heart sounds. Exam reveals no gallop and no friction rub.  No murmur heard.  Pulmonary/Chest: Effort normal and breath sounds normal. No respiratory distress.  has no wheezes.  Neck = supple, no nuchal rigidity.tracheostomy - healing Abdominal: Soft. Bowel sounds are normal.  exhibits no distension. There is no tenderness.  Lymphadenopathy: no cervical adenopathy. No axillary adenopathy Neurological: alert and oriented to person, place, and time.  Skin: Skin is warm and dry. No rash noted. No erythema.  Psychiatric: a normal mood and affect.  behavior is normal.    Lab Results Lab Results  Component Value Date  WBC 4.0 11/07/2023   HGB 10.1 (L) 11/07/2023   HCT 31.6 (L) 11/07/2023   MCV 90.8 11/07/2023   PLT 270 11/07/2023   Lab Results  Component Value Date   CREATININE 1.15 11/07/2023    Microbiology: reviewed Studies/Results: No results found.   Assessment/Plan: HIV disease = currently on tivicay  and truvada  due to peg -- we will switch to biktarvy  (originally taking) since she is able to swallow. Take daily. We will recheck CD 4 count and HIV VL.  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 She is to receive fluc 400mg , through end of may. We will check CD 4 count at that time to see if reconstituted.  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 To continue on aspirin /plavix  x x3 months, now at 6 wk mark @ discharge. To take through 6/19 as dual therapy, then to aspirin  alone.  I have personally spent 50 minutes involved in face-to-face and non-face-to-face activities for this patient on the day of the visit. Professional time spent includes the following activities: Preparing to see the patient (review of tests), Obtaining and/or reviewing separately obtained history (admission/discharge record), Performing a medically appropriate examination and/or evaluation , Ordering medications/tests/procedures, referring and communicating with other health care professionals, Documenting clinical information in the EMR, Independently interpreting results (not separately reported), Communicating results to the patient/family/caregiver, Counseling and educating the patient/family/caregiver and Care coordination (not separately reported).    evaluation of this patient requires complex antimicrobial therapy evaluation and counseling and isolation needs for disease transmission risk assessment and mitigation.     [facility] for Infectious Diseases Pager: [phone]  11/10/2023, 9:41 AM

## 2023-11-10 NOTE — Progress Notes (Signed)
 Occupational Therapy Treatment Patient Details Name: Jeremiah Stevens MRN: [id number] DOB: [birth year] Today's Date: 11/10/2023   History of present illness 48 yo pt admitted to [facility] 09/26/23 for difficulty swallowing. 3/19 PEA, ROSC after 2 min, then bradycardic arrest 1 min later, CPR resumed, pt intubated, and ROSC 6 min later. Seizure-like activity also noted briefly after ROSC. 3/20 pt transferred to [facility]. 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 3/21 self extubated and reintubated. 3/31 trach. Tracheostomy changed to #6 cuffless 4/14. S/p PEG 4/18. 4/24: Trach decannulated. PMHx: HIV/AIDS, HTN, herpes, migraine, depression, DM II, polysubstance use, and seizure.   OT comments  Pt greeting supine, pleasant and agreeable to therapy. Pt making great gains with therapy. Pt Mod I for bed mobility and ambulated in room and short distance in hall with supervision. Pt completed self care in standing at sink showing good stability, however, declined further ADL tasks. Pt eager to return home. Returned to supine with all needs met. Acute OT to continue to follow to address established goals to facilitate DC to next venue of care.        If plan is discharge home, recommend the following:  A little help with walking and/or transfers;A little help with bathing/dressing/bathroom;Assist for transportation;Help with stairs or ramp for entrance;Supervision due to cognitive status   Equipment Recommendations  Wheelchair (measurements OT);Wheelchair cushion (measurements OT);BSC/3in1;Other (comment)    Recommendations for Other Services      Precautions / Restrictions Precautions Precautions: Fall;Other (comment) Recall of Precautions/Restrictions: Intact Precaution/Restrictions Comments: PEG Required Braces or Orthoses: Other Brace Other Brace: Abdominal binder Restrictions Weight Bearing Restrictions Per Provider Order: No       Mobility Bed Mobility Overal  bed mobility: Modified Independent Bed Mobility: Supine to Sit     Supine to sit: Modified independent (Device/Increase time) Sit to supine: Modified independent (Device/Increase time)   General bed mobility comments: Mod I, extra time, no assist    Transfers Overall transfer level: Modified independent Equipment used: None Transfers: Sit to/from Stand Sit to Stand: Modified independent (Device/Increase time)           General transfer comment: pt with no overt LOB, rises well with good technique     Balance Overall balance assessment: Modified Independent Sitting-balance support: Feet supported Sitting balance-Leahy Scale: Good Sitting balance - Comments: EOB   Standing balance support: No upper extremity supported, During functional activity Standing balance-Leahy Scale: Fair Standing balance comment: walks without external support and completes functional tasks with no external support                           ADL either performed or assessed with clinical judgement   ADL Overall ADL's : Needs assistance/impaired     Grooming: Oral care;Wash/dry face;Standing;Supervision/safety Grooming Details (indicate cue type and reason): at sink, pt with no LOB or unsteadiness         Upper Body Dressing : Supervision/safety;Sitting Upper Body Dressing Details (indicate cue type and reason): donning clean gown                 Functional mobility during ADLs: Supervision/safety General ADL Comments: declined other ADL activities, eager to mobilize and did well with all task, assume pt is nearing baseline function    Extremity/Trunk Assessment              Vision       Perception     Praxis  Communication Communication Communication: Impaired Factors Affecting Communication: Reduced clarity of speech   Cognition Arousal: Alert Behavior During Therapy: WFL for tasks assessed/performed Cognition: No apparent impairments              OT - Cognition Comments: within functional limits                 Following commands: Intact Following commands impaired: Only follows one step commands consistently, Follows multi-step commands inconsistently      Cueing      Exercises      Shoulder Instructions       General Comments VSS on RA    Pertinent Vitals/ Pain       Pain Assessment Pain Assessment: No/denies pain Pain Score: 0-No pain Pain Intervention(s): Monitored during session  Home Living                                          Prior Functioning/Environment              Frequency  Min 2X/week        Progress Toward Goals  OT Goals(current goals can now be found in the care plan section)  Progress towards OT goals: Progressing toward goals  Acute Rehab OT Goals Patient Stated Goal: go home OT Goal Formulation: With patient Time For Goal Achievement: 11/24/23 Potential to Achieve Goals: Good ADL Goals Pt Will Perform Upper Body Dressing: Independently;sitting Pt Will Perform Lower Body Dressing: with modified independence;sitting/lateral leans;sit to/from stand Pt Will Transfer to Toilet: with modified independence;ambulating Additional ADL Goal #1: pt will complete HEP R UE min (A) written  Plan      Co-evaluation                 AM-PAC OT "6 Clicks" Daily Activity     Outcome Measure   Help from another person eating meals?: None Help from another person taking care of personal grooming?: None Help from another person toileting, which includes using toliet, bedpan, or urinal?: A Little Help from another person bathing (including washing, rinsing, drying)?: A Little Help from another person to put on and taking off regular upper body clothing?: None Help from another person to put on and taking off regular lower body clothing?: A Little 6 Click Score: 21    End of Session    OT Visit Diagnosis: Unsteadiness on feet (R26.81);Muscle weakness  (generalized) (M62.81) Symptoms and signs involving cognitive functions: Cerebral infarction   Activity Tolerance Patient tolerated treatment well   Patient Left in bed;with call bell/phone within reach   Nurse Communication Mobility status        Time: 1610-9604 OT Time Calculation (min): 19 min  Charges: OT General Charges $OT Visit: 1 Visit OT Treatments $Self Care/Home Management : 8-22 mins  Jamall Strohmeier, BS, OTA/S   Amiliana Foutz 11/10/2023, 12:14 PM

## 2023-11-10 NOTE — Progress Notes (Signed)
 Speech Language Pathology Treatment: Dysphagia  Patient Details Name: Jeremiah Stevens MRN: [id number] DOB: 05-13-1976 Today's Date: 11/10/2023 Time: 4098-1191 SLP Time Calculation (min) (ACUTE ONLY): 25 min  Assessment / Plan / Recommendation Clinical Impression  Patient seen by SLP for skilled treatment focused on dysphagia goals and completing education as patient is discharging home (lives with brother and his wife) today. SLP educated patient and provided her with printed handout discussing the importance of good oral hygiene. While SLP in room, patient's RN had pills to give her. Patient wanted to try to take pills whole and indicated that is how she plans to take them at home. SLP observed her taking pills, whole, one at a time with sips of water . One instance of delayed cough about halfway through (she had approximately 7 pills to take) but no other overt s/s. As per most recent MBS, recommendation continues to be for meds crushed, however as patient indicates she does not want to crush her meds, safest alternative would be whole, one at time. SLP again emphasized to patient the importance of caution with PO intake, trying to adhere to recommended minced (dysphagia 2) solids, avoiding rapid PO intake, etc. OP SLP recommended to follow patient for her dysphagia.    HPI HPI: Jeremiah Stevens is a 48 y.o. adult who identifies as male, admitted 09/26/23 with dysphagia x 3 days along with headaches and drooling. She was found to be hypoxic to 80s on room air. CTA showed multifocal PNA.  3/18 she had right facial droop, right sided weakness, dysarthria. MRI demonstrated acute right medullary infarct. Despite ongoing dysphagia (MBS showed moderately severe pharyngeal phase dysphagia and imaging showed unilateral bulging on right), pt insisted on eating. She reportedly ordered pizza overnight 3/18 and certainly aspirated, desaturated, ultimately suffered PEA arrest, 9 minutes ACLS, copious food  material and emesis in oropharynx. Self extubated 3/21, reintubated and extubated 3/26, Trach on 3/31, ATC on 4/4. Decannulated on 4/24.      SLP Plan  All goals met      Recommendations for follow up therapy are one component of a multi-disciplinary discharge planning process, led by the attending physician.  Recommendations may be updated based on patient status, additional functional criteria and insurance authorization.    Recommendations  Diet recommendations: Thin liquid;Dysphagia 2 (fine chop) Liquids provided via: Cup;Straw Medication Administration: Crushed with puree Supervision: Intermittent supervision to cue for compensatory strategies Compensations: Slow rate;Small sips/bites;Clear throat after each swallow;Follow solids with liquid;Multiple dry swallows after each bite/sip;Minimize environmental distractions;Other (Comment) Postural Changes and/or Swallow Maneuvers: Seated upright 90 degrees;Upright 30-60 min after meal                  Oral care BID     Dysphagia, pharyngeal phase (R13.13)     All goals met    Jacqualine Mater, MA, CCC-SLP Speech Therapy

## 2023-11-11 LAB — HIV-1 RNA QUANT-NO REFLEX-BLD
HIV 1 RNA Quant: 120 {copies}/mL
LOG10 HIV-1 RNA: 2.079 {Log_copies}/mL

## 2023-11-26 ENCOUNTER — Inpatient Hospital Stay: Admitting: Infectious Diseases

## 2023-11-26 ENCOUNTER — Telehealth: Payer: Self-pay

## 2023-11-26 DIAGNOSIS — Z0000 Encounter for general adult medical examination without abnormal findings: Secondary | ICD-10-CM | POA: Insufficient documentation

## 2023-11-26 DIAGNOSIS — Z79899 Other long term (current) drug therapy: Secondary | ICD-10-CM | POA: Insufficient documentation

## 2023-11-26 NOTE — Telephone Encounter (Signed)
 Left voicemail requesting patient call office back to reschedule missed appt today.  Julien Odor, RMA

## 2023-12-09 ENCOUNTER — Telehealth (HOSPITAL_COMMUNITY): Payer: Self-pay

## 2023-12-09 NOTE — Telephone Encounter (Signed)
Called to schedule peg removal, no answer, left vm. AB

## 2023-12-20 ENCOUNTER — Ambulatory Visit (INDEPENDENT_AMBULATORY_CARE_PROVIDER_SITE_OTHER): Admitting: Family Medicine

## 2023-12-20 ENCOUNTER — Encounter (HOSPITAL_BASED_OUTPATIENT_CLINIC_OR_DEPARTMENT_OTHER): Payer: Self-pay | Admitting: Family Medicine

## 2023-12-20 VITALS — BP 131/92 | HR 119 | Temp 98.1°F | Resp 16 | Ht 69.29 in | Wt 254.5 lb

## 2023-12-20 DIAGNOSIS — R5381 Other malaise: Secondary | ICD-10-CM | POA: Diagnosis not present

## 2023-12-20 DIAGNOSIS — B20 Human immunodeficiency virus [HIV] disease: Secondary | ICD-10-CM

## 2023-12-20 DIAGNOSIS — R269 Unspecified abnormalities of gait and mobility: Secondary | ICD-10-CM | POA: Insufficient documentation

## 2023-12-20 DIAGNOSIS — G4700 Insomnia, unspecified: Secondary | ICD-10-CM

## 2023-12-20 DIAGNOSIS — Z8673 Personal history of transient ischemic attack (TIA), and cerebral infarction without residual deficits: Secondary | ICD-10-CM

## 2023-12-20 DIAGNOSIS — R1310 Dysphagia, unspecified: Secondary | ICD-10-CM | POA: Insufficient documentation

## 2023-12-20 DIAGNOSIS — R5383 Other fatigue: Secondary | ICD-10-CM

## 2023-12-20 MED ORDER — QUETIAPINE FUMARATE 50 MG PO TABS: 75.0000 mg | ORAL_TABLET | Freq: Every day | ORAL | 1 refills | Status: DC

## 2023-12-20 NOTE — Progress Notes (Unsigned)
 Established Patient Office Visit  Subjective   [name], adult    DOB: 02-29-1976  Age: 48 y.o. MRN: [id number]  Chief Complaint  Patient presents with   Establish Care    Pt. Here to establish care.    feeding tube    Pt. Wants to talk about removal of feeding tube.     F/u as above.  Note recent hospitalized at [facility] with that discharge summary reviewed.  While inhouse, the patient suffered a stroke and had to be coded.  Ultimately required a Tracheostomy.  He has had some malaise.  Some struggles with insomnia.  Already has plans for ID follow up.  I'm happy to assist with the additional specialist follow up as advised by hospitalists.  He is very kind and states he is comfortable with any gender pronoun that I use today.  Of note, he states he has had a more labored gait for years after having rods inserted into his lower extremities.  He quickly denies it stemming from his recent stroke.    Past Medical History:  Diagnosis Date   Herpes genitalis in men    History of dysphagia    History of seizures    History of substance abuse (HCC)    HIV (human immunodeficiency virus infection) (HCC)    f/by ID at [facility]   HTN (hypertension)    Hyperlipidemia    Hypertension    under control with med., has been on med. x 1 yr.   Lateral malleolar fracture 09/02/2013   left   Migraines    Type 2 diabetes mellitus with hyperosmolar nonketotic hyperglycemia (HCC) 02/10/2020    Outpatient Encounter Medications as of 12/20/2023  Medication Sig   Accu-Chek Softclix Lancets lancets Use as directed 3 (three) times daily to check blood sugar.   albuterol  (VENTOLIN  HFA) 108 (90 Base) MCG/ACT inhaler Inhale 2 puffs into the lungs every 6 (six) hours as needed for wheezing or shortness of breath.   aspirin  81 MG chewable tablet Chew 1 tablet (81 mg total) by mouth daily.   atorvastatin  (LIPITOR) 20 MG tablet Take 1 tablet (20 mg total) by mouth at bedtime.   atovaquone   (MEPRON ) 750 MG/5ML suspension Take 10 mLs (1,500 mg total) by mouth daily with lunch.   Blood Glucose Monitoring Suppl (BLOOD GLUCOSE MONITOR SYSTEM) w/Device KIT Use as directed 3 (three) times daily.   clopidogrel  (PLAVIX ) 75 MG tablet Take 1 tablet (75 mg total) by mouth daily.   fluconazole  (DIFLUCAN ) 200 MG tablet Take 2 tablets (400 mg total) by mouth daily.   Glucose Blood (BLOOD GLUCOSE TEST STRIPS) STRP Use as directed 3 (three) times daily to check blood sugar.   insulin  aspart (NOVOLOG  FLEXPEN) 100 UNIT/ML FlexPen 0-9 Units, Subcutaneous, 3 times daily with meals CBG < 70: Implement Hypoglycemia measures CBG 70 - 120: 0 units CBG 121 - 150: 1 unit CBG 151 - 200: 2 units CBG 201 - 250: 3 units CBG 251 - 300: 5 units CBG 301 - 350: 7 units CBG 351 - 400: 9 units CBG > 400: call MD   insulin  glargine (LANTUS ) 100 UNIT/ML Solostar Pen Inject 15 Units into the skin 2 (two) times daily.   Insulin  Pen Needle 32G X 4 MM MISC Use as directed 3 (three) times daily.   Insulin  Pen Needle 32G X 4 MM MISC Use 1 each to inject insulin , up to 5 per day, as needed.   Lancet Device MISC 1 each  by Does not apply route 3 (three) times daily. May dispense any manufacturer covered by patient's insurance.   metoprolol  tartrate (LOPRESSOR ) 50 MG tablet Take 1 tablet (50 mg total) by mouth 2 (two) times daily.   pantoprazole  (PROTONIX ) 40 MG tablet Take 1 tablet (40 mg total) by mouth daily.   PARoxetine  (PAXIL ) 20 MG tablet Take 1 tablet (20 mg total) by mouth daily.   polyethylene glycol powder (GLYCOLAX /MIRALAX ) 17 GM/SCOOP powder Take 17 g by mouth daily as needed for moderate constipation.   bictegravir-emtricitabine -tenofovir  AF (BIKTARVY ) 50-200-25 MG TABS tablet Take 1 tablet by mouth daily.   QUEtiapine  (SEROQUEL ) 50 MG tablet Take 1.5 tablets (75 mg total) by mouth at bedtime.   [DISCONTINUED] QUEtiapine  (SEROQUEL ) 50 MG tablet Take 1 tablet (50 mg total) by mouth at bedtime. (Patient not taking:  Reported on 12/20/2023)   No facility-administered encounter medications on file as of 12/20/2023.    Social History   Tobacco Use   Smoking status: Every Day    Current packs/day: 0.50    Average packs/day: 0.5 packs/day for 26.4 years (13.2 ttl pk-yrs)    Types: Cigarettes    Start date: 1999   Smokeless tobacco: Never  Vaping Use   Vaping status: Unknown  Substance Use Topics   Alcohol  use: Not Currently   Drug use: Not Currently    Frequency: 7.0 times per week    Types: Marijuana    Comment: Daily     {History (Optional):23778}  Review of Systems  Constitutional:  Negative for diaphoresis, fever, malaise/fatigue and weight loss.  Respiratory:  Negative for cough, shortness of breath and wheezing.   Cardiovascular:  Negative for chest pain, palpitations, orthopnea, claudication, leg swelling and PND.      Objective:     BP (!) 131/92 (BP Location: Right Arm, Patient Position: Standing, Cuff Size: Normal)   Pulse (!) 119   Temp 98.1 F (36.7 C) (Oral)   Resp 16   Ht 5' 9.29" (1.76 m)   Wt 254 lb 8 oz (115.4 kg)   SpO2 93%   BMI 37.27 kg/m  {Vitals History (Optional):23777}  Physical Exam Constitutional:      General: She is not in acute distress.    Appearance: Normal appearance.  HENT:     Head: Normocephalic.  Neck:     Vascular: No carotid bruit.  Cardiovascular:     Rate and Rhythm: Normal rate and regular rhythm.     Pulses: Normal pulses.     Heart sounds: Normal heart sounds.  Pulmonary:     Effort: Pulmonary effort is normal.     Breath sounds: Normal breath sounds.  Abdominal:     General: Bowel sounds are normal.     Palpations: Abdomen is soft.     Comments: G-tube looks good and clean  Musculoskeletal:     Cervical back: Neck supple. No tenderness.     Right lower leg: No edema.     Left lower leg: No edema.  Neurological:     Mental Status: She is alert.      No results found for any visits on 12/20/23.  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  Dispense: 30 tablet; Refill: 1  Insomnia, unspecified type  History of stroke -     Ambulatory referral to Neurology    Return in about 2 weeks (around 01/03/2024) for chronic follow-up.    REDDING Reece Cane., MD

## 2023-12-21 ENCOUNTER — Ambulatory Visit (HOSPITAL_BASED_OUTPATIENT_CLINIC_OR_DEPARTMENT_OTHER): Payer: Self-pay | Admitting: Family Medicine

## 2023-12-21 ENCOUNTER — Encounter (HOSPITAL_BASED_OUTPATIENT_CLINIC_OR_DEPARTMENT_OTHER): Payer: Self-pay | Admitting: Family Medicine

## 2023-12-21 LAB — COMPREHENSIVE METABOLIC PANEL WITH GFR
ALT: 13 IU/L (ref 0–44)
AST: 11 IU/L (ref 0–40)
Albumin: 3.6 g/dL — ABNORMAL LOW (ref 4.1–5.1)
Alkaline Phosphatase: 115 IU/L (ref 44–121)
BUN/Creatinine Ratio: 13 (ref 9–20)
BUN: 12 mg/dL (ref 6–24)
Bilirubin Total: <0.2 mg/dL (ref 0.0–1.2)
CO2: 28 mmol/L (ref 20–29)
Calcium: 9.2 mg/dL (ref 8.7–10.2)
Chloride: 100 mmol/L (ref 96–106)
Creatinine, Ser: 0.91 mg/dL (ref 0.76–1.27)
Globulin, Total: 4.3 g/dL (ref 1.5–4.5)
Glucose: 137 mg/dL — ABNORMAL HIGH (ref 70–99)
Potassium: 4 mmol/L (ref 3.5–5.2)
Sodium: 140 mmol/L (ref 134–144)
Total Protein: 7.9 g/dL (ref 6.0–8.5)
eGFR: 105 mL/min/{1.73_m2} (ref >59)

## 2023-12-21 LAB — CBC WITH DIFFERENTIAL/PLATELET
Basophils Absolute: 0 10*3/uL (ref 0.0–0.2)
Basos: 0 %
EOS (ABSOLUTE): 0.2 10*3/uL (ref 0.0–0.4)
Eos: 3 %
Hematocrit: 39.4 % (ref 37.5–51.0)
Hemoglobin: 12.4 g/dL — ABNORMAL LOW (ref 13.0–17.7)
Immature Grans (Abs): 0 10*3/uL (ref 0.0–0.1)
Immature Granulocytes: 0 %
Lymphocytes Absolute: 1.7 10*3/uL (ref 0.7–3.1)
Lymphs: 31 %
MCH: 29 pg (ref 26.6–33.0)
MCHC: 31.5 g/dL (ref 31.5–35.7)
MCV: 92 fL (ref 79–97)
Monocytes Absolute: 0.6 10*3/uL (ref 0.1–0.9)
Monocytes: 10 %
Neutrophils Absolute: 3.1 10*3/uL (ref 1.4–7.0)
Neutrophils: 56 %
Platelets: 301 10*3/uL (ref 150–450)
RBC: 4.27 x10E6/uL (ref 4.14–5.80)
RDW: 15.5 % — ABNORMAL HIGH (ref 11.6–15.4)
WBC: 5.5 10*3/uL (ref 3.4–10.8)

## 2023-12-21 NOTE — Assessment & Plan Note (Addendum)
 Extensive review of records and discussion today.  Gently increase his Seroquel  dosage to see if we can assist with his sleep.  Referrals made.  Await labs and f/u with him soon.  Arrange for close followup.  One hour on his care reviewing extensive history, prolonged interview, etc.

## 2023-12-31 ENCOUNTER — Inpatient Hospital Stay: Payer: Self-pay | Admitting: Internal Medicine

## 2024-01-13 ENCOUNTER — Emergency Department (HOSPITAL_COMMUNITY)

## 2024-01-13 ENCOUNTER — Other Ambulatory Visit: Payer: Self-pay

## 2024-01-13 ENCOUNTER — Emergency Department (HOSPITAL_COMMUNITY)
Admission: EM | Admit: 2024-01-13 | Discharge: 2024-01-14 | Disposition: A | Discharge status: Home / Self Care | Attending: Student | Admitting: Student

## 2024-01-13 DIAGNOSIS — K9420 Gastrostomy complication, unspecified: Secondary | ICD-10-CM | POA: Diagnosis present

## 2024-01-13 LAB — COMPREHENSIVE METABOLIC PANEL WITH GFR
ALT: 13 U/L (ref 0–44)
AST: 17 U/L (ref 15–41)
Albumin: 3.1 g/dL — ABNORMAL LOW (ref 3.5–5.0)
Alkaline Phosphatase: 87 U/L (ref 38–126)
Anion gap: 8 (ref 5–15)
BUN: 9 mg/dL (ref 6–20)
CO2: 28 mmol/L (ref 22–32)
Calcium: 9.1 mg/dL (ref 8.9–10.3)
Chloride: 103 mmol/L (ref 98–111)
Creatinine, Ser: 1.2 mg/dL (ref 0.61–1.24)
GFR, Estimated: >60 mL/min (ref >60)
Glucose, Bld: 142 mg/dL — ABNORMAL HIGH (ref 70–99)
Potassium: 3.3 mmol/L — ABNORMAL LOW (ref 3.5–5.1)
Sodium: 139 mmol/L (ref 135–145)
Total Bilirubin: 0.4 mg/dL (ref 0.0–1.2)
Total Protein: 8 g/dL (ref 6.5–8.1)

## 2024-01-13 LAB — CBC WITH DIFFERENTIAL/PLATELET
Abs Immature Granulocytes: 0.01 10*3/uL (ref 0.00–0.07)
Basophils Absolute: 0 10*3/uL (ref 0.0–0.1)
Basophils Relative: 1 %
Eosinophils Absolute: 0.1 10*3/uL (ref 0.0–0.5)
Eosinophils Relative: 3 %
HCT: 40 % (ref 39.0–52.0)
Hemoglobin: 12.6 g/dL — ABNORMAL LOW (ref 13.0–17.0)
Immature Granulocytes: 0 %
Lymphocytes Relative: 35 %
Lymphs Abs: 1.7 10*3/uL (ref 0.7–4.0)
MCH: 28.3 pg (ref 26.0–34.0)
MCHC: 31.5 g/dL (ref 30.0–36.0)
MCV: 89.9 fL (ref 80.0–100.0)
Monocytes Absolute: 0.5 10*3/uL (ref 0.1–1.0)
Monocytes Relative: 10 %
Neutro Abs: 2.4 10*3/uL (ref 1.7–7.7)
Neutrophils Relative %: 51 %
Platelets: 239 10*3/uL (ref 150–400)
RBC: 4.45 MIL/uL (ref 4.22–5.81)
RDW: 14.9 % (ref 11.5–15.5)
WBC: 4.7 10*3/uL (ref 4.0–10.5)
nRBC: 0 % (ref 0.0–0.2)

## 2024-01-13 MED ORDER — IOHEXOL 350 MG/ML SOLN
75.0000 mL | Freq: Once | INTRAVENOUS | Status: AC | PRN
  Administered 2024-01-13: 75 mL via INTRAVENOUS

## 2024-01-13 NOTE — ED Triage Notes (Addendum)
 Patient reports redness/drainage at insertion site of his g-tube this week , denies fever or chills . Hypertensive at arrival .

## 2024-01-13 NOTE — ED Provider Triage Note (Signed)
 Emergency Medicine Provider Triage Evaluation Note  Jeremiah Stevens , Stevens 48 y.o. adult  was evaluated in triage.  Pt complains of G-tube concerns.  Patient reports that they had Stevens G-tube while hospitalized several months ago.  Reports that there was was to have the tube being removed but this has remained in place.  Concern for infection at this time with redness and drainage at the insertion site of the G-tube.  Review of Systems  Positive: As above Negative: As above  Physical Exam  BP (!) 181/110 (BP Location: Right Arm)   Pulse (!) 110   Temp 98.9 F (37.2 C)   Resp 18   SpO2 99%  Gen:   Awake, no distress   Resp:  Normal effort  MSK:   Moves extremities without difficulty  Other:  The skin around the G-tube has notable breakdown occurring with some ulcerated skin as well as Stevens serous drainage that is present.  Tenderness overlying this area.  Some erythema overlying the abdomen at the site of the G-tube.  Medical Decision Making  Medically screening exam initiated at 8:59 PM.  Appropriate orders placed.  Jeremiah Stevens was informed that the remainder of the evaluation will be completed by another provider, this initial triage assessment does not replace that evaluation, and the importance of remaining in the ED until their evaluation is complete.     Jeremiah Stevens A, PA-C 01/13/24 2101

## 2024-01-14 MED ORDER — BACITRACIN ZINC 500 UNIT/GM EX OINT: TOPICAL_OINTMENT | Freq: Two times a day (BID) | CUTANEOUS | Status: DC

## 2024-01-14 NOTE — Discharge Instructions (Signed)
 You need to follow-up with your surgeon for G-tube removal.  You can call the clinic listed.  Please keep padding around the tube to prevent skin breakdown.

## 2024-01-14 NOTE — ED Provider Notes (Signed)
 [facility]PT [facility] Emergency Department Provider Note MRN:  [id number]  Arrival date & time: 01/14/24     Chief Complaint   G-tube Problem    History of Present Illness   Jeremiah Stevens is a 48 y.o. year-old adult presents to the ED with chief complaint of G-tube problem.  Patient requesting removal of G-tube.  States that there has been some skin breakdown around the G-tube that is painful.  Denies fevers or chills.  Denies any treatments prior to arrival.  Denies any other new associated symptoms..  History provided by patient.   Review of Systems  Pertinent positive and negative review of systems noted in HPI.    Physical Exam   Vitals:   01/13/24 2358 01/14/24 0220  BP: (!) 188/114 (!) 153/100  Pulse: (!) 111 96  Resp: 19 19  Temp: 98.5 F (36.9 C) 98.4 F (36.9 C)  SpO2: 98% 96%    CONSTITUTIONAL:  non toxic-appearing, NAD NEURO:  Alert and oriented x 3, CN 3-12 grossly intact EYES:  eyes equal and reactive ENT/NECK:  Supple, no stridor  CARDIO:  normal rate on my exam, regular rhythm, appears well-perfused  PULM:  No respiratory distress,  GI/GU:  non-distended, there is skin break down surrounding the g-tube, no evidence of abscess or cellulitis MSK/SPINE:  No gross deformities, no edema, moves all extremities  SKIN:  no rash, as above   *Additional and/or pertinent findings included in MDM below  Diagnostic and Interventional Summary    EKG Interpretation Date/Time:    Ventricular Rate:    PR Interval:    QRS Duration:    QT Interval:    QTC Calculation:   R Axis:      Text Interpretation:         Labs Reviewed  CBC WITH DIFFERENTIAL/PLATELET - Abnormal; Notable for the following components:      Result Value   Hemoglobin 12.6 (*)    All other components within normal limits  COMPREHENSIVE METABOLIC PANEL WITH GFR - Abnormal; Notable for the following components:   Potassium 3.3 (*)    Glucose, Bld 142 (*)    Albumin   3.1 (*)    All other components within normal limits    CT ABDOMEN PELVIS W CONTRAST  Final Result      Medications  bacitracin  ointment (has no administration in time range)  iohexol  (OMNIPAQUE ) 350 MG/ML injection 75 mL (75 mLs Intravenous Contrast Given 01/13/24 2326)     Procedures  /  Critical Care Procedures  ED Course and Medical Decision Making  I have reviewed the triage vital signs, the nursing notes, and pertinent available records from the EMR.  Social Determinants Affecting Complexity of Care: Patient has no clinically significant social determinants affecting this chief complaint..   ED Course: Clinical Course as of 01/14/24 0350  Fri Jan 14, 2024  0349 Comprehensive metabolic panel(!) Mild hypokalemia, supplemented in the ED. [RB]  0349 CBC with Differential(!) No significant leukocytosis to suggest infection [RB]    Clinical Course User Index [RB] Vicky Charleston, PA-C    Medical Decision Making Patient here requesting removal of G-tube.  There is some skin breakdown inferiorly, this was dressed with some bacitracin  and padded with gauze.  I have advised patient follow-up with general surgery for consideration of tube removal.  Patient understands and agrees with the plan.  Labs and imaging ordered in triage are reassuring.  Risk OTC drugs.         Consultants:  No consultations were needed in caring for this patient.   Treatment and Plan: Emergency department workup does not suggest an emergent condition requiring admission or immediate intervention beyond  what has been performed at this time. The patient is safe for discharge and has  been instructed to return immediately for worsening symptoms, change in  symptoms or any other concerns    Final Clinical Impressions(s) / ED Diagnoses     ICD-10-CM   1. Problem with gastrostomy tube [facility])  K94.20       ED Discharge Orders     None         Discharge Instructions Discussed with and  Provided to Patient:     Discharge Instructions      You need to follow-up with your surgeon for G-tube removal.  You can call the clinic listed.  Please keep padding around the tube to prevent skin breakdown.       Vicky Charleston, PA-C 01/14/24 0350    Albertina Dixon, MD 01/14/24 [phone]

## 2024-01-21 ENCOUNTER — Other Ambulatory Visit (HOSPITAL_COMMUNITY): Payer: Self-pay

## 2024-01-21 ENCOUNTER — Ambulatory Visit (HOSPITAL_COMMUNITY)
Admission: RE | Admit: 2024-01-21 | Discharge: 2024-01-21 | Disposition: A | Discharge status: Home / Self Care | Source: Ambulatory Visit | Attending: Student

## 2024-01-21 DIAGNOSIS — Z431 Encounter for attention to gastrostomy: Secondary | ICD-10-CM | POA: Diagnosis not present

## 2024-01-21 DIAGNOSIS — J690 Pneumonitis due to inhalation of food and vomit: Secondary | ICD-10-CM | POA: Diagnosis present

## 2024-01-21 HISTORY — PX: IR GASTROSTOMY TUBE REMOVAL: IMG5492

## 2024-01-21 MED ORDER — LIDOCAINE VISCOUS HCL 2 % MT SOLN
OROMUCOSAL | Status: AC
  Filled 2024-01-21: qty 15

## 2024-01-28 ENCOUNTER — Inpatient Hospital Stay: Admitting: Internal Medicine

## 2024-02-08 ENCOUNTER — Encounter (HOSPITAL_BASED_OUTPATIENT_CLINIC_OR_DEPARTMENT_OTHER): Payer: Self-pay

## 2024-02-29 ENCOUNTER — Other Ambulatory Visit (HOSPITAL_COMMUNITY): Payer: Self-pay

## 2024-03-17 ENCOUNTER — Emergency Department (HOSPITAL_COMMUNITY)
Admission: EM | Admit: 2024-03-17 | Discharge: 2024-03-18 | Disposition: A | Discharge status: Home / Self Care | Attending: Emergency Medicine | Admitting: Emergency Medicine

## 2024-03-17 ENCOUNTER — Emergency Department (HOSPITAL_COMMUNITY)

## 2024-03-17 ENCOUNTER — Other Ambulatory Visit: Payer: Self-pay

## 2024-03-17 ENCOUNTER — Encounter (HOSPITAL_COMMUNITY): Payer: Self-pay

## 2024-03-17 DIAGNOSIS — R7989 Other specified abnormal findings of blood chemistry: Secondary | ICD-10-CM | POA: Diagnosis not present

## 2024-03-17 DIAGNOSIS — Z21 Asymptomatic human immunodeficiency virus [HIV] infection status: Secondary | ICD-10-CM | POA: Insufficient documentation

## 2024-03-17 DIAGNOSIS — Z7982 Long term (current) use of aspirin: Secondary | ICD-10-CM | POA: Diagnosis not present

## 2024-03-17 DIAGNOSIS — R072 Precordial pain: Secondary | ICD-10-CM | POA: Insufficient documentation

## 2024-03-17 DIAGNOSIS — Z91010 Allergy to peanuts: Secondary | ICD-10-CM | POA: Insufficient documentation

## 2024-03-17 DIAGNOSIS — E119 Type 2 diabetes mellitus without complications: Secondary | ICD-10-CM | POA: Insufficient documentation

## 2024-03-17 DIAGNOSIS — Z794 Long term (current) use of insulin: Secondary | ICD-10-CM | POA: Diagnosis not present

## 2024-03-17 DIAGNOSIS — I10 Essential (primary) hypertension: Secondary | ICD-10-CM | POA: Insufficient documentation

## 2024-03-17 DIAGNOSIS — R079 Chest pain, unspecified: Secondary | ICD-10-CM | POA: Diagnosis present

## 2024-03-17 LAB — BASIC METABOLIC PANEL WITH GFR
Anion gap: 9 (ref 5–15)
BUN: 14 mg/dL (ref 6–20)
CO2: 25 mmol/L (ref 22–32)
Calcium: 8.7 mg/dL — ABNORMAL LOW (ref 8.9–10.3)
Chloride: 105 mmol/L (ref 98–111)
Creatinine, Ser: 0.98 mg/dL (ref 0.61–1.24)
GFR, Estimated: >60 mL/min (ref >60)
Glucose, Bld: 140 mg/dL — ABNORMAL HIGH (ref 70–99)
Potassium: 3.8 mmol/L (ref 3.5–5.1)
Sodium: 139 mmol/L (ref 135–145)

## 2024-03-17 LAB — CBC
HCT: 43 % (ref 39.0–52.0)
Hemoglobin: 14.1 g/dL (ref 13.0–17.0)
MCH: 28.7 pg (ref 26.0–34.0)
MCHC: 32.8 g/dL (ref 30.0–36.0)
MCV: 87.6 fL (ref 80.0–100.0)
Platelets: 183 K/uL (ref 150–400)
RBC: 4.91 MIL/uL (ref 4.22–5.81)
RDW: 15.8 % — ABNORMAL HIGH (ref 11.5–15.5)
WBC: 3.8 K/uL — ABNORMAL LOW (ref 4.0–10.5)
nRBC: 0 % (ref 0.0–0.2)

## 2024-03-17 LAB — TROPONIN I (HIGH SENSITIVITY)
Troponin I (High Sensitivity): 37 ng/L — ABNORMAL HIGH (ref <18)
Troponin I (High Sensitivity): 36 ng/L — ABNORMAL HIGH (ref <18)

## 2024-03-17 LAB — D-DIMER, QUANTITATIVE: D-Dimer, Quant: 0.57 ug{FEU}/mL — ABNORMAL HIGH (ref 0.00–0.50)

## 2024-03-17 MED ORDER — DOXYCYCLINE HYCLATE 100 MG PO CAPS: 100.0000 mg | ORAL_CAPSULE | Freq: Two times a day (BID) | ORAL | 0 refills | Status: AC

## 2024-03-17 MED ORDER — IPRATROPIUM-ALBUTEROL 0.5-2.5 (3) MG/3ML IN SOLN
6.0000 mL | Freq: Once | RESPIRATORY_TRACT | Status: AC
  Administered 2024-03-17: 6 mL via RESPIRATORY_TRACT
  Filled 2024-03-17: qty 3

## 2024-03-17 MED ORDER — HYDROXYZINE HCL 25 MG PO TABS
50.0000 mg | ORAL_TABLET | Freq: Once | ORAL | Status: AC
  Administered 2024-03-17: 50 mg via ORAL
  Filled 2024-03-17: qty 2

## 2024-03-17 MED ORDER — IOHEXOL 350 MG/ML SOLN
90.0000 mL | Freq: Once | INTRAVENOUS | Status: AC | PRN
  Administered 2024-03-17: 90 mL via INTRAVENOUS

## 2024-03-17 NOTE — ED Notes (Signed)
 RR 26, HR-111. Bettyann NOVAK, PA aware and states the patient is still okay for discharge.

## 2024-03-17 NOTE — Discharge Instructions (Addendum)
 You were seen today for chest pain.  It was noted today that your heart enzyme, troponins, were elevated today indicating that some heart damage has been done at this time however they were stable and did not show any acute changes.  For this reason as well as you having intermittent chest pain, recommended that you continue to follow-up with cardiology, I have sent in a referral for you.  They will reach out to you in the next 24 to 48 hours however if they do not, please reach out to their office and schedule an appointment to have them evaluate you.  I am also sending an antibiotic for you to take for the next 5 days, to ensure that you are covered due to your immunocompromise status.  Would also recommend you continue to follow-up with your PCP at this time for further evaluation.  Please return to the ED though if he is having new or worsening symptoms or to include fever, shortness of breath, chest pain, confusion, unilateral weakness, painful urination or blood in urine or stool.

## 2024-03-17 NOTE — ED Triage Notes (Signed)
 C/o chest pain onset earlier states it feels a little better now however still has some tightness.

## 2024-03-17 NOTE — ED Provider Notes (Addendum)
 [place] [facility] AT [facility] Provider Note   CSN: [id number] Arrival date & time: 03/17/24  1312     Patient presents with: Chest Pain   Jeremiah Stevens is a 48 y.o. adult.    Chest Pain  Patient is a 48 year old individual coming to the ER today for acute onset chest pain that started this morning when patient woke up.  States that the patient has felt this way previously and gone away.  Notes that the patient has also endorsed a cough that has been ongoing for the last 2 weeks.  Notes that since arriving to the ED, patient has been feeling significantly better.  Personal history of HIV, AIDS, type 2 diabetes, HTN, substance abuse  Denies fever, headache, vision changes, diplopia, vertigo, sore throat, dysphagia, shortness of breath, abdominal pain, nausea, vomiting, diarrhea, dysuria, lower leg swelling.    Prior to Admission medications   Medication Sig Start Date End Date Taking? Authorizing Provider  Accu-Chek Softclix Lancets lancets Use as directed 3 (three) times daily to check blood sugar. 09/02/23   Pokhrel, Vernal, MD  albuterol  (VENTOLIN  HFA) 108 (90 Base) MCG/ACT inhaler Inhale 2 puffs into the lungs every 6 (six) hours as needed for wheezing or shortness of breath. 11/10/23   Ghimire, Donalda HERO, MD  aspirin  81 MG chewable tablet Chew 1 tablet (81 mg total) by mouth daily. 11/10/23   Ghimire, Donalda HERO, MD  atorvastatin  (LIPITOR) 20 MG tablet Take 1 tablet (20 mg total) by mouth at bedtime. 11/10/23   Ghimire, Donalda HERO, MD  atovaquone  (MEPRON ) 750 MG/5ML suspension Take 10 mLs (1,500 mg total) by mouth daily with lunch. 11/10/23   Ghimire, Donalda HERO, MD  Blood Glucose Monitoring Suppl (BLOOD GLUCOSE MONITOR SYSTEM) w/Device KIT Use as directed 3 (three) times daily. 09/02/23   Pokhrel, Laxman, MD  fluconazole  (DIFLUCAN ) 200 MG tablet Take 2 tablets (400 mg total) by mouth daily. 11/10/23   Ghimire, Donalda HERO, MD  Glucose Blood (BLOOD GLUCOSE TEST  STRIPS) STRP Use as directed 3 (three) times daily to check blood sugar. 09/02/23   Pokhrel, Laxman, MD  insulin  aspart (NOVOLOG  FLEXPEN) 100 UNIT/ML FlexPen 0-9 Units, Subcutaneous, 3 times daily with meals CBG < 70: Implement Hypoglycemia measures CBG 70 - 120: 0 units CBG 121 - 150: 1 unit CBG 151 - 200: 2 units CBG 201 - 250: 3 units CBG 251 - 300: 5 units CBG 301 - 350: 7 units CBG 351 - 400: 9 units CBG > 400: call MD 11/10/23   Raenelle Donalda HERO, MD  insulin  glargine (LANTUS ) 100 UNIT/ML Solostar Pen Inject 15 Units into the skin 2 (two) times daily. 11/10/23 04/02/24  Ghimire, Donalda HERO, MD  Insulin  Pen Needle 32G X 4 MM MISC Use as directed 3 (three) times daily. 09/02/23   Pokhrel, Laxman, MD  Insulin  Pen Needle 32G X 4 MM MISC Use 1 each to inject insulin , up to 5 per day, as needed. 11/10/23   Ghimire, Donalda HERO, MD  Lancet Device MISC 1 each by Does not apply route 3 (three) times daily. May dispense any manufacturer covered by patient's insurance. 09/02/23   Pokhrel, Laxman, MD  metoprolol  tartrate (LOPRESSOR ) 50 MG tablet Take 1 tablet (50 mg total) by mouth 2 (two) times daily. 11/10/23 02/08/24  Ghimire, Donalda HERO, MD  pantoprazole  (PROTONIX ) 40 MG tablet Take 1 tablet (40 mg total) by mouth daily. 11/10/23 11/09/24  Raenelle Donalda HERO, MD  PARoxetine  (PAXIL ) 20 MG tablet  Take 1 tablet (20 mg total) by mouth daily. 11/10/23   Ghimire, Donalda HERO, MD  polyethylene glycol powder (GLYCOLAX /MIRALAX ) 17 GM/SCOOP powder Take 17 g by mouth daily as needed for moderate constipation. 11/10/23   Ghimire, Donalda HERO, MD  QUEtiapine  (SEROQUEL ) 50 MG tablet Take 1.5 tablets (75 mg total) by mouth at bedtime. 12/20/23   Dottie Norleen PHEBE PONCE, MD    Allergies: Penicillins, Peanut-containing drug products, and Sustiva [efavirenz]    Review of Systems  Cardiovascular:  Positive for chest pain.  All other systems reviewed and are negative.   Updated Vital Signs BP (!) 164/122   Pulse 98   Temp 98.8 F (37.1 C)  (Oral)   Resp (!) 33   Ht 6' (1.829 m)   SpO2 97%   BMI 34.52 kg/m   Physical Exam Vitals and nursing note reviewed.  Constitutional:      General: She is not in acute distress.    Appearance: Normal appearance. She is not ill-appearing or diaphoretic.  HENT:     Head: Normocephalic and atraumatic.  Eyes:     General: No scleral icterus.       Right eye: No discharge.        Left eye: No discharge.     Extraocular Movements: Extraocular movements intact.     Conjunctiva/sclera: Conjunctivae normal.  Cardiovascular:     Rate and Rhythm: Normal rate and regular rhythm.     Pulses: Normal pulses.     Heart sounds: Normal heart sounds. No murmur heard.    No friction rub. No gallop.  Pulmonary:     Effort: Pulmonary effort is normal. No respiratory distress.     Breath sounds: No stridor. Examination of the right-lower field reveals rhonchi. Examination of the left-lower field reveals rhonchi. Rhonchi present. No wheezing or rales.  Chest:     Chest wall: No tenderness.  Abdominal:     General: Abdomen is flat. There is no distension.     Palpations: Abdomen is soft.     Tenderness: There is no abdominal tenderness. There is no right CVA tenderness, left CVA tenderness, guarding or rebound.  Musculoskeletal:        General: No swelling, deformity or signs of injury.     Cervical back: Normal range of motion. No rigidity.     Right lower leg: No edema.     Left lower leg: No edema.  Skin:    General: Skin is warm and dry.     Findings: No bruising, erythema or lesion.  Neurological:     General: No focal deficit present.     Mental Status: She is alert and oriented to person, place, and time. Mental status is at baseline.     Sensory: No sensory deficit.     Motor: No weakness.     Gait: Gait normal.  Psychiatric:        Mood and Affect: Mood normal.     (all labs ordered are listed, but only abnormal results are displayed) Labs Reviewed  CBC - Abnormal; Notable for  the following components:      Result Value   WBC 3.8 (*)    RDW 15.8 (*)    All other components within normal limits  BASIC METABOLIC PANEL WITH GFR - Abnormal; Notable for the following components:   Glucose, Bld 140 (*)    Calcium  8.7 (*)    All other components within normal limits  D-DIMER, QUANTITATIVE - Abnormal; Notable for the  following components:   D-Dimer, Quant 0.57 (*)    All other components within normal limits  TROPONIN I (HIGH SENSITIVITY) - Abnormal; Notable for the following components:   Troponin I (High Sensitivity) 36 (*)    All other components within normal limits  TROPONIN I (HIGH SENSITIVITY) - Abnormal; Notable for the following components:   Troponin I (High Sensitivity) 37 (*)    All other components within normal limits    EKG: EKG Interpretation Date/Time:  Friday March 17 2024 13:19:42 EDT Ventricular Rate:  110 PR Interval:  142 QRS Duration:  74 QT Interval:  332 QTC Calculation: 449 R Axis:   105  Text Interpretation: Sinus tachycardia Possible Left atrial enlargement Rightward axis T wave abnormality, consider inferior ischemia Abnormal ECG When compared with ECG of 27-Oct-2023 20:26, PREVIOUS ECG IS PRESENT Confirmed by Elnor Savant ([id number]) on 03/17/2024 11:10:39 PM  Radiology: CT Angio Chest PE W and/or Wo Contrast Result Date: 03/17/2024 EXAM: CTA of the Chest with contrast for PE 03/17/2024 10:35:00 PM TECHNIQUE: CTA of the chest was performed after the administration of intravenous contrast. Multiplanar reformatted images are provided for review. MIP images are provided for review. Automated exposure control, iterative reconstruction, and/or weight based adjustment of the mA/kV was utilized to reduce the radiation dose to as low as reasonably achievable. The study was performed with and without intravenous contrast, using 90mL of iohexol  (OMNIPAQUE ) 350 MG/ML injection. COMPARISON: Same day radiographs and CTA chest 10/29/2023 CLINICAL  HISTORY: Pulmonary embolism (PE) suspected, low to intermediate probability, positive D-dimer. FINDINGS: PULMONARY ARTERIES: Pulmonary arteries are adequately opacified for evaluation. No pulmonary embolism. MEDIASTINUM: The heart and pericardium demonstrate no acute abnormality. No pericardial effusion. The thoracic aorta is of normal caliber, without dissection. LYMPH NODES: No mediastinal, hilar or axillary lymphadenopathy. LUNGS AND PLEURA: The lungs are without acute process. No focal consolidation or pulmonary edema. No pleural effusion or pneumothorax. 6 mm nodule in the right upper lobe on series 12 image 51. This is stable since 10/04/2021 and benign. No follow-up recommended. UPPER ABDOMEN: Limited images of the upper abdomen are unremarkable. SOFT TISSUES AND BONES: Remote left anterior rib fractures, which are chronic. No acute bone or soft tissue abnormality. IMPRESSION: 1. No pulmonary embolism. 2. No acute pulmonary abnormality. Electronically signed by: Norman Gatlin MD 03/17/2024 11:00 PM EDT RP Workstation: [id number]   DG Chest 2 View Result Date: 03/17/2024 CLINICAL DATA:  Chest pain. EXAM: CHEST - 2 VIEW COMPARISON:  10/27/2023. FINDINGS: The heart size and mediastinal contours are within normal limits. Tracheostomy tube is no longer present. Mild streaky opacities in the right mid and lower lung zones could reflect atelectasis or infiltrate. No pleural effusion or pneumothorax. No acute osseous abnormality. IMPRESSION: Mild streaky opacities in the right mid and lower lung zones could reflect atelectasis or infiltrate. Electronically Signed   By: Harrietta Sherry M.D.   On: 03/17/2024 13:58    Procedures   Medications Ordered in the ED  hydrOXYzine  (ATARAX ) tablet 50 mg (50 mg Oral Given 03/17/24 1744)  ipratropium-albuterol  (DUONEB) 0.5-2.5 (3) MG/3ML nebulizer solution 6 mL (6 mLs Nebulization Given 03/17/24 1800)  iohexol  (OMNIPAQUE ) 350 MG/ML injection 90 mL (90 mLs Intravenous  Contrast Given 03/17/24 2249)    Clinical Course as of 03/17/24 2322  Fri Mar 17, 2024  1758 DG Chest 2 View [CB]    Clinical Course User Index [CB] Beola Terrall RAMAN, PA-C  Geneva (Revised) Score: 5, Geneva Score Interpretation: Moderate Risk Group: ~20-30% incidence of pulmonary embolism from several studies PERC Score: 0, PERC Score Interpretation: No need for further workup, as <2% chance of PE.  If no criteria are positive and clinicians pre-test probability is <15%, PERC Rule criteria are satisfied Medical Decision Making Amount and/or Complexity of Data Reviewed Labs: ordered. Radiology: ordered. Decision-making details documented in ED Course.  Risk Prescription drug management.   This patient is a 48 year old individual who presents to the ED for concern of acute onset left-sided chest pain.  Upon arriving to the ED, all patient symptoms had abated.  On physical exam, patient is in no acute distress, afebrile, alert and orient x 4, speaking in full sentences, nontachypneic, nontachycardic.  Noted to have some mild rhonchi bilaterally.  With exam otherwise unremarkable.  No lower leg edema, no abdominal tenderness, no chest wall tenderness, no CVA tenderness, RRR, no murmur, oropharynx clear.  With current symptoms, labs and imaging were done.  Provided DuoNebs.  On reevaluation, rhonchi stopped however patient was mildly tachycardic likely secondary to the albuterol .  D-dimer was positive requiring CT scan which showed no acute pulmonary etiology.  With CT findings not showing pneumonia, PE, and patient symptoms have resolved, believe she is stable to be discharged at this time.  Troponins are flat.  However with them being elevated, and not having symptoms, will recommend that she follow-up with cardiology in the outpatient setting.  With referral made.  With her noted to be still slightly tachycardic and mildly tachypneic, but with known pulmonary findings  on CT and reassuring labs, provide doxycycline  in the abundance of caution.  As well as have her with close follow-up.  Case was discussed with attending who agreed with plan.  Provided strict return ER precautions.  Will have her continue to follow-up with PCP as well for further evaluation and monitoring.  Patient vital signs have remained stable throughout the course of patient's time in the ED. Low suspicion for any other emergent pathology at this time. I believe this patient is safe to be discharged. Provided strict return to ER precautions. Patient expressed agreement and understanding of plan. All questions were answered.  Differential diagnoses prior to evaluation: The emergent differential diagnosis includes, but is not limited to, URI, HIV specific illness, ACS, AAS, Pulmonary Embolism, Tension Pneumothorax, Esophageal Rupture, Cardiac Tamponade, Pericarditis, Myocarditis, Pneumothorax, Pneumonia, Aortic Stenosis, CHF Exacerbation, GERD,  Esophageal Spasm,  Mallory-Weiss, Costochondritis, Musculoskeletal Chest Wall Pain, Anxiety / Panic Attack. This is not an exhaustive differential.   Past Medical History / Co-morbidities / Social History: HIV, HTN, migraine, AIDS, uncontrolled type 2 diabetes, seizure, cryptococcus  Additional history: Chart reviewed. Pertinent results include:  Last seen in the emergency department on 01/13/2024, requesting G-tube removal, was told to follow-up with surgeon for this request.  Last seen by family medicine on 12/20/2023 noted to increase to Seroquel  to help with sleep.  Lab Tests/Imaging studies: I personally interpreted labs/imaging and the pertinent results include:   CBC shows a mildly decreased white count of 3 point otherwise unremarkable BMP shows mild hypocalcemia of 0.7 but was unremarkable D-dimer slightly elevated at 0.57 Troponin 37 with delta of 36 Chest x-ray shows mild streaking concerning for possible atelectasis versus infiltrate  CT  scan PE study showing no signs of PE, acute pulmonary  etiology  I agree with the radiologist interpretation.  Cardiac monitoring: EKG obtained and interpreted by myself and attending physician which shows:   EKG Interpretation  Date/Time:  Friday March 17 2024 13:19:42 EDT Ventricular Rate:  110 PR Interval:  142 QRS Duration:  74 QT Interval:  332 QTC Calculation: 449 R Axis:   105  Text Interpretation: Sinus tachycardia Possible Left atrial enlargement Rightward axis T wave abnormality, consider inferior ischemia Abnormal ECG When compared with ECG of 27-Oct-2023 20:26, PREVIOUS ECG IS PRESENT Confirmed by Elnor Savant ([id number]) on 03/17/2024 11:10:39 PM          Medications: I ordered medication including hydroxyzine , DuoNebs.  I have reviewed the patients home medicines and have made adjustments as needed.  Critical Interventions: None  Social Determinants of Health: HIV/AIDS  Disposition: After consideration of the diagnostic results and the patients response to treatment, I feel that the patient would benefit from discharge and treatment as above.   emergency department workup does not suggest an emergent condition requiring admission or immediate intervention beyond what has been performed at this time. The plan is: Follow-up with PCP, follow-up with cardiology, return for new or worsening symptoms. The patient is safe for discharge and has been instructed to return immediately for worsening symptoms, change in symptoms or any other concerns.    Final diagnoses:  Precordial pain  Elevated troponin    ED Discharge Orders          Ordered    Ambulatory referral to Cardiology       Comments: If you have not heard from the Cardiology office within the next 72 hours please call [phone].   03/17/24 2315               Beola Terrall RAMAN, PA-C 03/17/24 2323    Beola Terrall RAMAN, PA-C 03/17/24 2355    Elnor Savant LABOR, DO 03/20/24 [id number]

## 2024-03-18 NOTE — ED Notes (Signed)
 Pt wheeled from ED, but able to ambulate w/ gait steady. Patient and support person stated understanding follow-up care and prescription. A&Ox4 at departure.

## 2024-03-23 ENCOUNTER — Other Ambulatory Visit (HOSPITAL_COMMUNITY): Payer: Self-pay

## 2024-03-23 ENCOUNTER — Ambulatory Visit (INDEPENDENT_AMBULATORY_CARE_PROVIDER_SITE_OTHER): Admitting: Internal Medicine

## 2024-03-23 ENCOUNTER — Other Ambulatory Visit: Payer: Self-pay

## 2024-03-23 ENCOUNTER — Encounter: Payer: Self-pay | Admitting: Internal Medicine

## 2024-03-23 VITALS — BP 149/107 | HR 117 | Temp 98.0°F | Ht 69.0 in | Wt 280.0 lb

## 2024-03-23 DIAGNOSIS — I10 Essential (primary) hypertension: Secondary | ICD-10-CM | POA: Diagnosis not present

## 2024-03-23 DIAGNOSIS — E1165 Type 2 diabetes mellitus with hyperglycemia: Secondary | ICD-10-CM

## 2024-03-23 DIAGNOSIS — B20 Human immunodeficiency virus [HIV] disease: Secondary | ICD-10-CM | POA: Diagnosis not present

## 2024-03-23 DIAGNOSIS — Z794 Long term (current) use of insulin: Secondary | ICD-10-CM

## 2024-03-23 DIAGNOSIS — Z79899 Other long term (current) drug therapy: Secondary | ICD-10-CM | POA: Diagnosis not present

## 2024-03-23 DIAGNOSIS — Z23 Encounter for immunization: Secondary | ICD-10-CM | POA: Diagnosis not present

## 2024-03-23 MED ORDER — LANCET DEVICE MISC: 1.0000 | Freq: Three times a day (TID) | 0 refills | Status: DC

## 2024-03-23 MED ORDER — PANTOPRAZOLE SODIUM 40 MG PO TBEC: 40.0000 mg | DELAYED_RELEASE_TABLET | Freq: Every day | ORAL | 1 refills | Status: DC

## 2024-03-23 MED ORDER — BICTEGRAVIR-EMTRICITAB-TENOFOV 50-200-25 MG PO TABS: 1.0000 | ORAL_TABLET | Freq: Every day | ORAL | 11 refills | Status: DC

## 2024-03-23 MED ORDER — PAROXETINE HCL 40 MG PO TABS
40.0000 mg | ORAL_TABLET | ORAL | 11 refills | Status: DC
Start: 2024-03-23 — End: 2024-04-21

## 2024-03-23 MED ORDER — METOPROLOL TARTRATE 50 MG PO TABS: 50.0000 mg | ORAL_TABLET | Freq: Two times a day (BID) | ORAL | 1 refills | Status: DC

## 2024-03-23 MED ORDER — NOVOLOG FLEXPEN 100 UNIT/ML ~~LOC~~ SOPN: PEN_INJECTOR | SUBCUTANEOUS | 0 refills | Status: DC

## 2024-03-23 MED ORDER — INSULIN PEN NEEDLE 32G X 4 MM MISC: 1.0000 | Freq: Three times a day (TID) | 0 refills | Status: DC

## 2024-03-23 MED ORDER — ASPIRIN 81 MG PO CHEW: 81.0000 mg | CHEWABLE_TABLET | Freq: Every day | ORAL | 1 refills | Status: DC

## 2024-03-23 MED ORDER — BLOOD GLUCOSE TEST VI STRP: 1.0000 | ORAL_STRIP | Freq: Three times a day (TID) | 0 refills | Status: DC

## 2024-03-23 MED ORDER — ATORVASTATIN CALCIUM 20 MG PO TABS: 20.0000 mg | ORAL_TABLET | Freq: Every day | ORAL | 1 refills | Status: DC

## 2024-03-23 MED ORDER — INSULIN GLARGINE 100 UNIT/ML SOLOSTAR PEN: 15.0000 [IU] | PEN_INJECTOR | Freq: Two times a day (BID) | SUBCUTANEOUS | 1 refills | Status: DC

## 2024-03-23 NOTE — Progress Notes (Signed)
 [name], adult   DOB: 1975/11/12, 48 y.o.   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 She was intubated, required tracheostomy then decannulated on 4/24. Received PEG on 4/19. Since discharge it doesn't look like she has followed up until now. She reports that she Has residual left sided numbness. She reports that she has started to feel more anxious since hospitalization. That is leading to her having insomnia; staying in small house, friend, and now staying with her cousin in town.   Finishing treatment for pneumonia. Slight residual cough  Outpatient Encounter Medications as of 03/23/2024  Medication Sig   Accu-Chek Softclix Lancets lancets Use as directed 3 (three) times daily to check blood sugar.   albuterol  (VENTOLIN  HFA) 108 (90 Base) MCG/ACT inhaler Inhale 2 puffs into the lungs every 6 (six) hours as needed for wheezing or shortness of breath.   aspirin  81 MG chewable tablet Chew 1 tablet (81 mg total) by mouth daily.   atorvastatin  (LIPITOR) 20 MG tablet Take 1 tablet (20 mg total) by mouth at bedtime.   Blood Glucose Monitoring Suppl (BLOOD GLUCOSE MONITOR SYSTEM) w/Device KIT Use as directed 3 (three) times daily.   Glucose Blood (BLOOD GLUCOSE TEST STRIPS) STRP Use as directed 3 (three) times daily to check blood sugar.   insulin  aspart (NOVOLOG  FLEXPEN) 100 UNIT/ML FlexPen 0-9 Units, Subcutaneous, 3 times daily with meals CBG < 70: Implement Hypoglycemia measures CBG 70 - 120: 0 units CBG 121 - 150: 1 unit CBG 151 - 200: 2 units CBG 201 - 250: 3 units CBG 251 - 300: 5 units CBG 301 - 350: 7 units CBG 351 - 400: 9 units CBG > 400: call MD   insulin  glargine (LANTUS ) 100 UNIT/ML Solostar Pen Inject 15 Units into the skin 2 (two) times daily.   Insulin  Pen Needle 32G X 4 MM MISC Use as directed 3 (three) times daily.    Insulin  Pen Needle 32G X 4 MM MISC Use 1 each to inject insulin , up to 5 per day, as needed.   Lancet Device MISC 1 each by Does not apply route 3 (three) times daily. May dispense any manufacturer covered by patient's insurance.   metoprolol  tartrate (LOPRESSOR ) 50 MG tablet Take 1 tablet (50 mg total) by mouth 2 (two) times daily.   pantoprazole  (PROTONIX ) 40 MG tablet Take 1 tablet (40 mg total) by mouth daily.   PARoxetine  (PAXIL ) 20 MG tablet Take 1 tablet (20 mg total) by mouth daily.   QUEtiapine  (SEROQUEL ) 50 MG tablet Take 1.5 tablets (75 mg total) by mouth at bedtime.   atovaquone  (MEPRON ) 750 MG/5ML suspension Take 10 mLs (1,500 mg total) by mouth daily with lunch. (Patient not taking: Reported on 03/23/2024)   fluconazole  (DIFLUCAN ) 200 MG tablet Take 2 tablets (400 mg total) by mouth daily.   polyethylene glycol powder (GLYCOLAX /MIRALAX ) 17 GM/SCOOP powder Take 17 g by mouth daily as needed for moderate constipation. (Patient not taking: Reported on 03/23/2024)   No facility-administered encounter medications on file as of 03/23/2024.     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/05/2023   Aspiration pneumonia of both lungs due to vomit (HCC) 09/29/2023   Cardiac arrest, cause unspecified (HCC) 09/29/2023   Cerebrovascular accident (CVA) (HCC) 09/29/2023   Seizures (HCC) 09/29/2023   CAP (community acquired pneumonia) 09/26/2023   Globus sensation 09/26/2023   Cryptococcus (HCC) 08/29/2023   Seizure (HCC) 08/26/2023   Acute hypoxic respiratory failure (HCC) 08/26/2023   Acute encephalopathy 08/26/2023   Hyperammonemia (HCC) 08/26/2023   Pancytopenia (HCC)  08/26/2023   Hyperosmolar hyperglycemic state (HHS) (HCC) 08/25/2023   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   HIV infection (HCC) 08/03/2022   RLL pneumonia 08/02/2022   AKI (acute kidney injury) (HCC) 08/02/2022   Uncontrolled type 2 diabetes mellitus with hyperglycemia, without long-term current use of insulin  (HCC) 08/02/2022   Tobacco abuse 08/02/2022   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   Non-suicidal depressed mood 07/01/2018   Migraine 08/02/2013   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   Human immunodeficiency virus (HIV) disease 10/27/2011   HTN (hypertension) 10/27/2011   Hypertriglyceridemia 10/27/2011   Herpes 10/27/2011     Health Maintenance Due  Topic Date Due   COVID-19 Vaccine (1) Never done   FOOT EXAM  Never done   OPHTHALMOLOGY EXAM  Never done   Diabetic kidney evaluation - Urine ACR  Never done   Hepatitis B Vaccines 19-59 Average Risk (1 of 3 - 19+ 3-dose series) Never done   Pneumococcal Vaccine (2 of 2 - PCV) 08/08/2011   Mammogram  Never done   Colonoscopy  Never done   Influenza Vaccine  02/11/2024     Review of Systems Review of Systems  Constitutional: Negative for fever, chills, diaphoresis, activity change, appetite change, fatigue and unexpected weight change.  HENT: Negative for congestion, sore throat, rhinorrhea, sneezing, trouble swallowing and sinus pressure.  Eyes: Negative for photophobia and visual disturbance.  Respiratory: Negative for cough, chest tightness, shortness of breath, wheezing and stridor.  Cardiovascular: Negative for chest pain, palpitations and leg swelling.  Gastrointestinal: Negative for nausea, vomiting, abdominal pain, diarrhea, constipation, blood in stool, abdominal distention and anal bleeding.  Genitourinary: Negative for dysuria, hematuria, flank pain and difficulty urinating.  Musculoskeletal: Negative for myalgias, back pain, joint swelling, arthralgias and gait problem.  Skin: Negative for color change, pallor, rash and  wound.  Neurological: Negative for dizziness, tremors, weakness and light-headedness.  Hematological: Negative for adenopathy. Does not bruise/bleed easily.  Psychiatric/Behavioral: Negative for behavioral problems, confusion, sleep disturbance, dysphoric mood, decreased concentration and agitation.   Physical Exam   BP (!) 149/107   Pulse (!) 117   Temp 98 F (36.7 C) (Temporal)   Ht 5' 9 (1.753 m)   Wt 280 lb (127 kg)   SpO2 94%   BMI 41.35 kg/m   Physical Exam  Constitutional:  oriented to person, place, and time. appears well-developed and well-nourished. No distress.  HENT: [place]/AT, PERRLA, no scleral icterus Mouth/Throat: Oropharynx is clear and moist. No oropharyngeal exudate.  Cardiovascular: Normal rate, regular rhythm and normal heart sounds. Exam reveals no gallop and no friction rub.  No murmur heard.  Pulmonary/Chest: Effort normal and breath sounds normal. No respiratory distress.  has no wheezes.  Neck = supple, no nuchal rigidity Abdominal: Soft. Bowel sounds are normal.  exhibits no distension. There is no tenderness.  Lymphadenopathy: no cervical adenopathy. No axillary adenopathy Neurological: alert and oriented to person, place, and time.  Skin: Skin is warm and dry. No rash noted. No erythema.  Psychiatric: a normal mood and affect.  behavior is normal.   Lab Results  Component Value Date   CD4TCELL 12 (L) 11/10/2023   Lab Results  Component Value Date   CD4TABS 172 (L) 11/10/2023   CD4TABS 36 (L) 09/27/2023   CD4TABS 43 (L) 08/26/2023   Lab Results  Component Value Date   HIV1RNAQUANT 120 11/10/2023   Lab Results  Component Value Date   HEPBSAB NEG 10/08/2011   Lab Results  Component Value Date   LABRPR NON REACTIVE 08/26/2023    CBC Lab Results  Component Value Date   WBC 3.8 (L) 03/17/2024   RBC 4.91 03/17/2024   HGB 14.1 03/17/2024   HCT 43.0 03/17/2024   PLT 183 03/17/2024   MCV 87.6 03/17/2024   MCH 28.7 03/17/2024   MCHC 32.8  03/17/2024   RDW 15.8 (H) 03/17/2024   LYMPHSABS 1.7 01/13/2024   MONOABS 0.5 01/13/2024   EOSABS 0.1 01/13/2024    BMET Lab Results  Component Value Date   NA 139 03/17/2024   K 3.8 03/17/2024   CL 105 03/17/2024   CO2 25 03/17/2024   GLUCOSE 140 (H) 03/17/2024   BUN 14 03/17/2024   CREATININE 0.98 03/17/2024   CALCIUM  8.7 (L) 03/17/2024   GFRNONAA >60 03/17/2024   GFRAA 103 05/22/2020      Assessment and Plan  Hiv disease= patient has gap in receiving biktarvy . We will access application and get refills for biktarvy . Will check cd 4 count and VL  Long term medication management =will check cr to see if stable  Insomnia = lower dose of seroquel   Anxiety = will try to increase paxil  20 to 40mg    See back in 4 wk to sort out remaining health needs  Htn = above goal today. See if need to adjust meds at next visit  IDDM= will check hgba1c; will refill insulin , and insulin  supplies  Hx of PEG = see when to remove.   Health maintenance = will give flu shot

## 2024-03-23 NOTE — Patient Instructions (Signed)
 Smoking Cessation: [employer]uitline[place] 1-800-QUIT-NOW [phone]); Espaol: 1-855-Djelo-Ya ([phone]) [email or web address]

## 2024-03-24 LAB — T-HELPER CELL (CD4) - (RCID CLINIC ONLY)
CD4 % Helper T Cell: 10 % — ABNORMAL LOW (ref 33–65)
CD4 T Cell Abs: 117 /uL — ABNORMAL LOW (ref 400–1790)

## 2024-03-28 LAB — HIV-1 RNA QUANT-NO REFLEX-BLD
HIV 1 RNA Quant: 532000 {copies}/mL — ABNORMAL HIGH
HIV-1 RNA Quant, Log: 5.73 {Log_copies}/mL — ABNORMAL HIGH

## 2024-03-28 LAB — LIPID PANEL
Cholesterol: 155 mg/dL (ref <200)
HDL: 27 mg/dL — ABNORMAL LOW (ref >=40)
LDL Cholesterol (Calc): 93 mg/dL
Non-HDL Cholesterol (Calc): 128 mg/dL (ref <130)
Total CHOL/HDL Ratio: 5.7 (calc) — ABNORMAL HIGH (ref <5.0)
Triglycerides: 265 mg/dL — ABNORMAL HIGH (ref <150)

## 2024-03-28 LAB — HEMOGLOBIN A1C
Hgb A1c MFr Bld: 7.5 % — ABNORMAL HIGH (ref <5.7)
Mean Plasma Glucose: 169 mg/dL
eAG (mmol/L): 9.3 mmol/L

## 2024-03-28 LAB — RPR: RPR Ser Ql: NONREACTIVE

## 2024-04-20 ENCOUNTER — Ambulatory Visit: Admitting: Internal Medicine

## 2024-05-08 ENCOUNTER — Telehealth: Payer: Self-pay | Admitting: Internal Medicine

## 2024-05-08 NOTE — Telephone Encounter (Signed)
 I called the vital records DAVE program to get update to why my application to get DAVE program has not been approved so that I can sign death certificate.  They are forwarding application to supervisor so that I can get login and password information in order to sign death certificate

## 2024-05-09 LAB — PNEUMOCYSTIS JIROVECI SMEAR BY DFA

## 2024-05-13 DEATH — deceased
